# Patient Record
Sex: Male | Born: 1950 | Race: White | Hispanic: No | Marital: Married | Smoking: Former smoker
Health system: Southern US, Community
[De-identification: ages and names within clinical notes are randomized; demographics above are authoritative.]

## PROBLEM LIST (undated history)

## (undated) DIAGNOSIS — Z7960 Long term (current) use of unspecified immunomodulators and immunosuppressants: Secondary | ICD-10-CM

## (undated) DIAGNOSIS — R740 Nonspecific elevation of levels of transaminase and lactic acid dehydrogenase [LDH]: Secondary | ICD-10-CM

## (undated) DIAGNOSIS — K222 Esophageal obstruction: Secondary | ICD-10-CM

## (undated) DIAGNOSIS — K760 Fatty (change of) liver, not elsewhere classified: Secondary | ICD-10-CM

## (undated) DIAGNOSIS — E119 Type 2 diabetes mellitus without complications: Secondary | ICD-10-CM

## (undated) DIAGNOSIS — K449 Diaphragmatic hernia without obstruction or gangrene: Secondary | ICD-10-CM

## (undated) DIAGNOSIS — R195 Other fecal abnormalities: Secondary | ICD-10-CM

## (undated) DIAGNOSIS — G4733 Obstructive sleep apnea (adult) (pediatric): Secondary | ICD-10-CM

## (undated) DIAGNOSIS — I208 Other forms of angina pectoris: Secondary | ICD-10-CM

## (undated) DIAGNOSIS — U071 COVID-19: Secondary | ICD-10-CM

## (undated) DIAGNOSIS — L4050 Arthropathic psoriasis, unspecified: Secondary | ICD-10-CM

## (undated) DIAGNOSIS — Z79899 Other long term (current) drug therapy: Secondary | ICD-10-CM

## (undated) DIAGNOSIS — L409 Psoriasis, unspecified: Secondary | ICD-10-CM

## (undated) DIAGNOSIS — D649 Anemia, unspecified: Secondary | ICD-10-CM

## (undated) DIAGNOSIS — M5126 Other intervertebral disc displacement, lumbar region: Secondary | ICD-10-CM

## (undated) DIAGNOSIS — I2089 Other forms of angina pectoris: Secondary | ICD-10-CM

## (undated) DIAGNOSIS — I700 Atherosclerosis of aorta: Secondary | ICD-10-CM

## (undated) DIAGNOSIS — M5416 Radiculopathy, lumbar region: Secondary | ICD-10-CM

## (undated) DIAGNOSIS — K208 Other esophagitis without bleeding: Secondary | ICD-10-CM

## (undated) DIAGNOSIS — M67441 Ganglion, right hand: Secondary | ICD-10-CM

## (undated) DIAGNOSIS — M5137 Other intervertebral disc degeneration, lumbosacral region: Secondary | ICD-10-CM

## (undated) DIAGNOSIS — B3781 Candidal esophagitis: Secondary | ICD-10-CM

## (undated) DIAGNOSIS — K219 Gastro-esophageal reflux disease without esophagitis: Secondary | ICD-10-CM

## (undated) DIAGNOSIS — R972 Elevated prostate specific antigen [PSA]: Secondary | ICD-10-CM

## (undated) DIAGNOSIS — R945 Abnormal results of liver function studies: Secondary | ICD-10-CM

## (undated) DIAGNOSIS — I10 Essential (primary) hypertension: Secondary | ICD-10-CM

## (undated) DIAGNOSIS — G43109 Migraine with aura, not intractable, without status migrainosus: Secondary | ICD-10-CM

## (undated) DIAGNOSIS — M1990 Unspecified osteoarthritis, unspecified site: Secondary | ICD-10-CM

## (undated) DIAGNOSIS — K226 Gastro-esophageal laceration-hemorrhage syndrome: Secondary | ICD-10-CM

## (undated) DIAGNOSIS — I2510 Atherosclerotic heart disease of native coronary artery without angina pectoris: Secondary | ICD-10-CM

## (undated) DIAGNOSIS — R7401 Elevation of levels of liver transaminase levels: Secondary | ICD-10-CM

## (undated) DIAGNOSIS — T7840XA Allergy, unspecified, initial encounter: Secondary | ICD-10-CM

## (undated) DIAGNOSIS — R1310 Dysphagia, unspecified: Secondary | ICD-10-CM

## (undated) DIAGNOSIS — I493 Ventricular premature depolarization: Secondary | ICD-10-CM

## (undated) DIAGNOSIS — G4730 Sleep apnea, unspecified: Secondary | ICD-10-CM

## (undated) DIAGNOSIS — M51379 Other intervertebral disc degeneration, lumbosacral region without mention of lumbar back pain or lower extremity pain: Secondary | ICD-10-CM

## (undated) DIAGNOSIS — R7989 Other specified abnormal findings of blood chemistry: Secondary | ICD-10-CM

## (undated) DIAGNOSIS — E538 Deficiency of other specified B group vitamins: Secondary | ICD-10-CM

## (undated) DIAGNOSIS — K635 Polyp of colon: Secondary | ICD-10-CM

## (undated) DIAGNOSIS — E7800 Pure hypercholesterolemia, unspecified: Secondary | ICD-10-CM

## (undated) HISTORY — DX: Diaphragmatic hernia without obstruction or gangrene: K44.9

## (undated) HISTORY — DX: Sleep apnea, unspecified: G47.30

## (undated) HISTORY — PX: EYE SURGERY: SHX253

## (undated) HISTORY — PX: COLONOSCOPY: SHX174

## (undated) HISTORY — DX: Type 2 diabetes mellitus without complications: E11.9

## (undated) HISTORY — PX: ARTHROPLASTY: SHX135

## (undated) HISTORY — DX: COVID-19: U07.1

## (undated) HISTORY — PX: ROTATOR CUFF REPAIR: SHX139

## (undated) HISTORY — DX: Gastro-esophageal laceration-hemorrhage syndrome: K22.6

## (undated) HISTORY — PX: ESOPHAGOGASTRODUODENOSCOPY: SHX1529

## (undated) HISTORY — DX: Psoriasis, unspecified: L40.9

## (undated) HISTORY — DX: Dysphagia, unspecified: R13.10

## (undated) HISTORY — DX: Other specified abnormal findings of blood chemistry: R79.89

## (undated) HISTORY — DX: Arthropathic psoriasis, unspecified: L40.50

## (undated) HISTORY — DX: Abnormal results of liver function studies: R94.5

## (undated) HISTORY — DX: Essential (primary) hypertension: I10

## (undated) HISTORY — DX: Esophageal obstruction: K22.2

## (undated) HISTORY — PX: BLEPHAROPLASTY: SUR158

## (undated) HISTORY — PX: TONSILLECTOMY: SUR1361

## (undated) HISTORY — PX: CARDIAC CATHETERIZATION: SHX172

---

## 1968-01-15 HISTORY — PX: HERNIA REPAIR: SHX51

## 2002-01-14 HISTORY — PX: LUMBAR DISC SURGERY: SHX700

## 2003-01-15 HISTORY — PX: CARPAL TUNNEL RELEASE: SHX101

## 2003-11-07 ENCOUNTER — Other Ambulatory Visit: Payer: Self-pay

## 2003-11-07 ENCOUNTER — Emergency Department: Payer: Self-pay | Admitting: Emergency Medicine

## 2003-11-22 ENCOUNTER — Ambulatory Visit: Payer: Self-pay | Admitting: Internal Medicine

## 2003-12-06 ENCOUNTER — Ambulatory Visit: Payer: Self-pay | Admitting: General Surgery

## 2004-01-25 ENCOUNTER — Ambulatory Visit: Payer: Self-pay

## 2004-02-03 ENCOUNTER — Ambulatory Visit: Payer: Self-pay

## 2004-08-10 ENCOUNTER — Ambulatory Visit: Payer: Self-pay | Admitting: Internal Medicine

## 2004-12-25 ENCOUNTER — Ambulatory Visit: Payer: Self-pay | Admitting: Internal Medicine

## 2005-04-10 ENCOUNTER — Other Ambulatory Visit: Payer: Self-pay

## 2005-04-18 ENCOUNTER — Ambulatory Visit: Payer: Self-pay | Admitting: Unknown Physician Specialty

## 2005-11-13 ENCOUNTER — Ambulatory Visit: Payer: Self-pay | Admitting: Pain Medicine

## 2005-11-25 ENCOUNTER — Ambulatory Visit: Payer: Self-pay | Admitting: Pain Medicine

## 2005-11-28 ENCOUNTER — Ambulatory Visit: Payer: Self-pay | Admitting: Pain Medicine

## 2005-12-09 ENCOUNTER — Ambulatory Visit: Payer: Self-pay | Admitting: Gastroenterology

## 2005-12-17 ENCOUNTER — Ambulatory Visit: Payer: Self-pay | Admitting: Physician Assistant

## 2005-12-30 ENCOUNTER — Ambulatory Visit: Payer: Self-pay | Admitting: Pain Medicine

## 2005-12-31 ENCOUNTER — Ambulatory Visit: Payer: Self-pay | Admitting: Pain Medicine

## 2006-01-14 HISTORY — PX: NASAL SINUS SURGERY: SHX719

## 2006-01-14 HISTORY — PX: CHOLECYSTECTOMY: SHX55

## 2006-01-20 ENCOUNTER — Ambulatory Visit: Payer: Self-pay | Admitting: Pain Medicine

## 2006-01-30 ENCOUNTER — Ambulatory Visit: Payer: Self-pay | Admitting: Otolaryngology

## 2006-03-21 ENCOUNTER — Ambulatory Visit: Payer: Self-pay | Admitting: Otolaryngology

## 2006-03-25 ENCOUNTER — Ambulatory Visit: Payer: Self-pay | Admitting: Otolaryngology

## 2006-05-12 ENCOUNTER — Ambulatory Visit: Payer: Self-pay | Admitting: Internal Medicine

## 2006-05-15 ENCOUNTER — Ambulatory Visit: Payer: Self-pay | Admitting: Internal Medicine

## 2006-12-31 ENCOUNTER — Ambulatory Visit: Payer: Self-pay | Admitting: Unknown Physician Specialty

## 2006-12-31 ENCOUNTER — Other Ambulatory Visit: Payer: Self-pay

## 2007-01-06 ENCOUNTER — Ambulatory Visit: Payer: Self-pay | Admitting: Unknown Physician Specialty

## 2007-06-08 ENCOUNTER — Ambulatory Visit: Payer: Self-pay | Admitting: Unknown Physician Specialty

## 2008-05-13 ENCOUNTER — Ambulatory Visit: Payer: Self-pay | Admitting: Unknown Physician Specialty

## 2008-05-18 ENCOUNTER — Ambulatory Visit: Payer: Self-pay | Admitting: Pain Medicine

## 2008-05-24 ENCOUNTER — Ambulatory Visit: Payer: Self-pay | Admitting: Pain Medicine

## 2008-06-08 ENCOUNTER — Ambulatory Visit: Payer: Self-pay | Admitting: Physician Assistant

## 2008-08-29 ENCOUNTER — Ambulatory Visit: Payer: Self-pay | Admitting: Specialist

## 2009-01-03 ENCOUNTER — Ambulatory Visit: Payer: Self-pay | Admitting: Gastroenterology

## 2009-01-03 LAB — HM COLONOSCOPY: HM Colonoscopy: NORMAL

## 2010-04-09 ENCOUNTER — Ambulatory Visit: Payer: Self-pay | Admitting: Gastroenterology

## 2010-05-15 ENCOUNTER — Ambulatory Visit: Payer: Self-pay | Admitting: Gastroenterology

## 2010-06-08 ENCOUNTER — Ambulatory Visit: Payer: Self-pay | Admitting: Gastroenterology

## 2010-09-13 ENCOUNTER — Ambulatory Visit: Payer: Self-pay | Admitting: Internal Medicine

## 2010-10-05 ENCOUNTER — Ambulatory Visit: Payer: Self-pay | Admitting: Internal Medicine

## 2010-12-14 ENCOUNTER — Ambulatory Visit: Payer: Self-pay | Admitting: Gastroenterology

## 2011-02-12 ENCOUNTER — Other Ambulatory Visit: Payer: Self-pay | Admitting: Rheumatology

## 2011-02-12 LAB — SYNOVIAL CELL COUNT + DIFF, W/ CRYSTALS
Lymphocytes: 6 %
Neutrophils: 92 %
Nucleated Cell Count: 7298 /mm3

## 2011-07-04 ENCOUNTER — Ambulatory Visit: Payer: Self-pay | Admitting: Orthopedic Surgery

## 2011-09-25 ENCOUNTER — Other Ambulatory Visit: Payer: Self-pay | Admitting: Rheumatology

## 2011-09-25 LAB — SYNOVIAL CELL COUNT + DIFF, W/ CRYSTALS
Lymphocytes: 11 %
Neutrophils: 86 %
Nucleated Cell Count: 26813 /mm3
Other Cells BF: 0 %
Other Mononuclear Cells: 3 %

## 2011-10-03 ENCOUNTER — Ambulatory Visit: Payer: Self-pay | Admitting: Rheumatology

## 2012-01-30 ENCOUNTER — Encounter: Payer: Self-pay | Admitting: Internal Medicine

## 2012-01-30 ENCOUNTER — Ambulatory Visit (INDEPENDENT_AMBULATORY_CARE_PROVIDER_SITE_OTHER): Payer: BC Managed Care – PPO | Admitting: Internal Medicine

## 2012-01-30 VITALS — BP 118/70 | HR 67 | Temp 98.6°F | Ht 68.0 in | Wt 200.0 lb

## 2012-01-30 DIAGNOSIS — I10 Essential (primary) hypertension: Secondary | ICD-10-CM | POA: Insufficient documentation

## 2012-01-30 DIAGNOSIS — E119 Type 2 diabetes mellitus without complications: Secondary | ICD-10-CM

## 2012-01-30 DIAGNOSIS — R7989 Other specified abnormal findings of blood chemistry: Secondary | ICD-10-CM

## 2012-01-30 DIAGNOSIS — L4050 Arthropathic psoriasis, unspecified: Secondary | ICD-10-CM

## 2012-01-30 DIAGNOSIS — E1159 Type 2 diabetes mellitus with other circulatory complications: Secondary | ICD-10-CM | POA: Insufficient documentation

## 2012-01-30 DIAGNOSIS — R197 Diarrhea, unspecified: Secondary | ICD-10-CM

## 2012-01-30 DIAGNOSIS — E0822 Diabetes mellitus due to underlying condition with diabetic chronic kidney disease: Secondary | ICD-10-CM | POA: Insufficient documentation

## 2012-01-30 DIAGNOSIS — K219 Gastro-esophageal reflux disease without esophagitis: Secondary | ICD-10-CM

## 2012-01-30 DIAGNOSIS — R945 Abnormal results of liver function studies: Secondary | ICD-10-CM

## 2012-01-30 DIAGNOSIS — M1990 Unspecified osteoarthritis, unspecified site: Secondary | ICD-10-CM

## 2012-02-02 ENCOUNTER — Encounter: Payer: Self-pay | Admitting: Internal Medicine

## 2012-02-02 NOTE — Assessment & Plan Note (Signed)
Controlled.  Same medication.  Follow.   

## 2012-02-02 NOTE — Assessment & Plan Note (Signed)
Being followed.  Obtain latest lab results.

## 2012-02-02 NOTE — Assessment & Plan Note (Signed)
Being followed by Dr Tedd Sias.  Last a1c 5.2.  Same med regimen for now.  May have to consider the possibility of metformin causing the loose stool.  Await stool studies.

## 2012-02-02 NOTE — Assessment & Plan Note (Signed)
Blood pressure under good control.  Same medication.  Follow metabolic panel.   

## 2012-02-02 NOTE — Assessment & Plan Note (Signed)
Followed by Dr [facility].  Stable.  

## 2012-02-02 NOTE — Progress Notes (Signed)
Subjective:    Patient ID: Shane Mcknight, male    DOB: Jun 01, 1950, 62 y.o.   MRN: [id number]  HPI 62 year old male with past history of hypertension, diabetes mellitus, psoriatic arthritis (followed by Dr Gavin Potters) and elevated liver enzymes who comes in today for a scheduled follow up.  He states he is doing relatively well.  Recently diagnosed with diabetes.  Seeing Dr Tedd Sias.  Sugar doing better.  States his last a1c 5.2.  Has lost a lot of weight (approximately 40 lbs per pt).  States his am sugars averaging 70-135.  He is having persistent loose stool.  Started just before New Years.  Intermittently worse.  No vomiting.  No fever.  No significant abdominal pain.  No blood.  Worse at night.  He also reports some chest congestion and cough.  Intermittent green mucus.  No sob.  He is now taking four extended release metformin at night.  Wife has also had some bowel issues through this time.    Past Medical History  Diagnosis Date  . Hypertension   . Diabetes mellitus     type 2  . Abnormal liver function tests   . Psoriasis   . Hiatal hernia   . Psoriatic arthritis     Current Outpatient Prescriptions on File Prior to Visit  Medication Sig Dispense Refill  . atenolol (TENORMIN) 50 MG tablet Take 50 mg by mouth 2 (two) times daily.      Marland Kitchen esomeprazole (NEXIUM) 40 MG capsule Take 40 mg by mouth 2 (two) times daily.      Marland Kitchen glipiZIDE (GLIPIZIDE XL) 5 MG 24 hr tablet 2 tablets each morning      . InFLIXimab (REMICADE IV) Inject into the vein. Every 8 weeks.  Per Dr Gavin Potters      . losartan (COZAAR) 50 MG tablet Take 50 mg by mouth daily.      . metFORMIN (GLUCOPHAGE) 500 MG tablet 4 tablets daily at supper        Review of Systems Patient denies any headache, lightheadedness or dizziness.  No significant sinus issues.  No chest pain, tightness or palpitations.  No increased shortness of breath.  Does report the increased cough and congestion as outlined.  No nausea or vomiting.  No significant  abdominal pain or cramping.  No BRBPR or melana.  Does report the loose stool as outlined.  No urine change.  Sugar as outlined.  Blood pressures averaging 120-130/60-70.       Objective:   Physical Exam Filed Vitals:   01/30/12 1427  BP: 118/70  Pulse: 67  Temp: 98.6 F (37 C)   Blood pressure recheck:  128/76, pulse 74  62 year old male in no acute distress.   HEENT:  Nares - clear.  OP- without lesions or erythema.  TMs visualized without erythema.  NECK:  Supple, nontender.  No audible bruit.   HEART:  Appears to be regular. LUNGS:  Without crackles or wheezing audible.  Respirations even and unlabored.  Some minimal increased cough with forced expiration.  RADIAL PULSE:  Equal bilaterally.  ABDOMEN:  Soft, nontender.  No audible abdominal bruit.   EXTREMITIES:  No increased edema to be present.                    Assessment & Plan:  INCREASED COUGH AND CONGESTION.  Probably viral.  Treat with Robitussin/Mucinex as directed.  Saline nasal spray as directed.  Do not feel abx warranted.  Albuterol inhaler  as directed.  Follow.  Notify me if symptoms worsen or do not improve.    PERSISTENT LOOSE STOOL.  Could be related to the metformin.  However, his wife has also had some issues.  Last abx 11/13.  Will check stool for C. Diff and routine culture - given duration.  Start a probiotic.  Increased fiber.  May need to discuss with endocrinology metformin usage.  Follow.  Check cbc and metabolic panel.   HEALTH MAINTENANCE.  Obtain outside records to review.

## 2012-02-03 ENCOUNTER — Other Ambulatory Visit (INDEPENDENT_AMBULATORY_CARE_PROVIDER_SITE_OTHER): Payer: BC Managed Care – PPO

## 2012-02-03 DIAGNOSIS — R197 Diarrhea, unspecified: Secondary | ICD-10-CM

## 2012-02-04 LAB — FECAL LACTOFERRIN, QUANT: Lactoferrin: NEGATIVE

## 2012-02-05 LAB — OVA AND PARASITE SCREEN: OP: NONE SEEN

## 2012-02-07 ENCOUNTER — Other Ambulatory Visit: Payer: Self-pay | Admitting: Internal Medicine

## 2012-02-07 ENCOUNTER — Encounter: Payer: Self-pay | Admitting: Internal Medicine

## 2012-02-07 LAB — STOOL CULTURE

## 2012-02-07 NOTE — Progress Notes (Signed)
Dr Gavin Potters discontinued the Plaquenil.  Updated med list

## 2012-02-10 ENCOUNTER — Telehealth: Payer: Self-pay | Admitting: Internal Medicine

## 2012-02-10 NOTE — Telephone Encounter (Signed)
Patient returning your call about results.

## 2012-02-12 NOTE — Telephone Encounter (Signed)
Informed patient of test results.

## 2012-02-29 ENCOUNTER — Other Ambulatory Visit: Payer: Self-pay

## 2012-03-12 ENCOUNTER — Ambulatory Visit (INDEPENDENT_AMBULATORY_CARE_PROVIDER_SITE_OTHER): Payer: BC Managed Care – PPO | Admitting: Internal Medicine

## 2012-03-12 ENCOUNTER — Encounter: Payer: Self-pay | Admitting: Internal Medicine

## 2012-03-12 VITALS — BP 138/84 | HR 66 | Temp 98.5°F | Ht 68.0 in | Wt 204.0 lb

## 2012-03-12 DIAGNOSIS — K219 Gastro-esophageal reflux disease without esophagitis: Secondary | ICD-10-CM

## 2012-03-12 DIAGNOSIS — Z125 Encounter for screening for malignant neoplasm of prostate: Secondary | ICD-10-CM

## 2012-03-12 DIAGNOSIS — R945 Abnormal results of liver function studies: Secondary | ICD-10-CM

## 2012-03-12 DIAGNOSIS — G4733 Obstructive sleep apnea (adult) (pediatric): Secondary | ICD-10-CM

## 2012-03-12 DIAGNOSIS — E119 Type 2 diabetes mellitus without complications: Secondary | ICD-10-CM

## 2012-03-12 DIAGNOSIS — I10 Essential (primary) hypertension: Secondary | ICD-10-CM

## 2012-03-12 DIAGNOSIS — R7989 Other specified abnormal findings of blood chemistry: Secondary | ICD-10-CM

## 2012-03-12 DIAGNOSIS — L4050 Arthropathic psoriasis, unspecified: Secondary | ICD-10-CM

## 2012-03-13 ENCOUNTER — Telehealth: Payer: Self-pay | Admitting: Internal Medicine

## 2012-03-13 NOTE — Telephone Encounter (Signed)
Notified of labs via my chart 

## 2012-03-16 ENCOUNTER — Encounter: Payer: Self-pay | Admitting: Internal Medicine

## 2012-03-16 DIAGNOSIS — G4733 Obstructive sleep apnea (adult) (pediatric): Secondary | ICD-10-CM | POA: Insufficient documentation

## 2012-03-16 NOTE — Assessment & Plan Note (Signed)
Followed by Dr [facility].  Stable.  

## 2012-03-16 NOTE — Assessment & Plan Note (Signed)
Avoid sleeping supine and avoid sedating medication.    

## 2012-03-16 NOTE — Assessment & Plan Note (Signed)
Controlled.  Same medication.  Follow.   

## 2012-03-16 NOTE — Assessment & Plan Note (Signed)
Being followed.  Obtain latest lab results.  

## 2012-03-16 NOTE — Progress Notes (Addendum)
Subjective:    Patient ID: Shane Mcknight, male    DOB: October 06, 1950, 61 y.o.   MRN: [id number]  HPI 62 year old male with past history of hypertension, diabetes mellitus, psoriatic arthritis (followed by Dr Gavin Potters) and elevated liver enzymes who comes in today to follow up on these issues as well as for a complete physical exam.  He states he is doing relatively well.  Recently diagnosed with diabetes.  Seeing Dr Tedd Sias.  Sugar doing much better.  States his last a1c 5.2.  Has lost a lot of weight (approximately 40 lbs per pt).  States his highest am or pm reading has been 139.  Was previously having persistent loose stool.  Discussed the possibility of metformin causing.  He changed back to metformin (not the extended release) and bowels have been doing better.  No abdominal pain or cramping.  No diarrhea.  Joints stable.  Continues to see Dr Gavin Potters.     Past Medical History  Diagnosis Date  . Hypertension   . Diabetes mellitus     type 2  . Abnormal liver function tests   . Psoriasis   . Hiatal hernia   . Psoriatic arthritis     Current Outpatient Prescriptions on File Prior to Visit  Medication Sig Dispense Refill  . aspirin 81 MG tablet Take 81 mg by mouth daily.      Marland Kitchen esomeprazole (NEXIUM) 40 MG capsule Take 40 mg by mouth 2 (two) times daily.      . fish oil-omega-3 fatty acids 1000 MG capsule Take 2 g by mouth daily.      . folic acid (FOLVITE) 1 MG tablet Take 1 mg by mouth daily.      Marland Kitchen glipiZIDE (GLIPIZIDE XL) 5 MG 24 hr tablet 2 tablets each morning      . InFLIXimab (REMICADE IV) Inject into the vein. Every 8 weeks.  Per Dr Gavin Potters      . losartan (COZAAR) 50 MG tablet Take 50 mg by mouth daily.      . meloxicam (MOBIC) 15 MG tablet Take 15 mg by mouth daily.      . metFORMIN (GLUCOPHAGE) 500 MG tablet 4 tablets daily at supper      . Multiple Vitamin (MULTIVITAMIN) tablet Take 1 tablet by mouth daily.      . vitamin E 400 UNIT capsule Take 400 Units by mouth daily.      Marland Kitchen  atenolol (TENORMIN) 50 MG tablet Take 50 mg by mouth 2 (two) times daily.      . traMADol (ULTRAM-ER) 200 MG 24 hr tablet Take 200 mg by mouth daily.       No current facility-administered medications on file prior to visit.    Review of Systems Patient denies any headache, lightheadedness or dizziness.  No significant sinus issues.  No chest pain, tightness or palpitations.  No increased shortness of breath.  No cough or congestion.  No nausea or vomiting.  No significant abdominal pain or cramping.  No BRBPR or melana.  The previous loose stool has resolved.  Bowels back to normal.   No urine change.  Sugar as outlined.  Blood pressures doing well.  Joints stable.        Objective:   Physical Exam  Filed Vitals:   03/12/12 1612  BP: 138/84  Pulse: 66  Temp: 98.5 F (70.73 C)   62 year old male in no acute distress.  HEENT:  Nares - clear.  Oropharynx - without lesions.  NECK:  Supple.  Nontender.  No audible carotid bruit.  HEART:  Appears to be regular.   LUNGS:  No crackles or wheezing audible.  Respirations even and unlabored.   RADIAL PULSE:  Equal bilaterally.  ABDOMEN:  Soft.  Nontender.  Bowel sounds present and normal.  No audible abdominal bruit.  GU:  Normal descended testicles.  No palpable testicular nodules.   RECTAL:  Could not appreciate any palpable prostate nodules.  Heme negative.   EXTREMITIES:  No increased edema present.  DP pulses palpable and equal bilaterally.            Assessment & Plan:   PREVIOUS LOOSE STOOL.  Resolved with changing the metformin.   RENAL CYST.  Previous renal ultrasound revealed a left parapelvic cyst.  Follow.     HEALTH MAINTENANCE.  Physical today.  Check psa.  Obtain records to review last colonoscopy.

## 2012-03-16 NOTE — Assessment & Plan Note (Signed)
Being followed by Dr Tedd Sias.  Last a1c 5.2.  Adjusted the metformin and the loose stool resolved.  Continue diet and exercise.  Follow.

## 2012-03-16 NOTE — Assessment & Plan Note (Signed)
Blood pressure under good control.  Same medication.  Follow metabolic panel.   

## 2012-04-17 ENCOUNTER — Telehealth: Payer: Self-pay | Admitting: *Deleted

## 2012-04-17 MED ORDER — ESOMEPRAZOLE MAGNESIUM 40 MG PO CPDR: 40.0000 mg | DELAYED_RELEASE_CAPSULE | Freq: Two times a day (BID) | ORAL | Status: DC

## 2012-04-17 NOTE — Telephone Encounter (Signed)
Pharmacy called with rx request 

## 2012-07-01 ENCOUNTER — Other Ambulatory Visit: Payer: Self-pay | Admitting: Internal Medicine

## 2012-07-24 LAB — HEMOGLOBIN A1C: Hgb A1c MFr Bld: 5.4 % (ref 4.0–6.0)

## 2012-07-30 LAB — HM DIABETES FOOT EXAM

## 2012-08-19 ENCOUNTER — Encounter: Payer: Self-pay | Admitting: *Deleted

## 2012-08-31 NOTE — Telephone Encounter (Signed)
Mailed unread message to pt  

## 2012-09-09 ENCOUNTER — Telehealth: Payer: Self-pay | Admitting: *Deleted

## 2012-09-09 ENCOUNTER — Encounter: Payer: Self-pay | Admitting: Internal Medicine

## 2012-09-09 ENCOUNTER — Encounter: Payer: Self-pay | Admitting: *Deleted

## 2012-09-09 NOTE — Telephone Encounter (Signed)
Updated med list per mychart message 

## 2012-11-02 ENCOUNTER — Other Ambulatory Visit: Payer: Self-pay | Admitting: Internal Medicine

## 2012-12-06 ENCOUNTER — Other Ambulatory Visit: Payer: Self-pay | Admitting: Internal Medicine

## 2012-12-09 ENCOUNTER — Encounter: Payer: Self-pay | Admitting: *Deleted

## 2012-12-10 ENCOUNTER — Encounter: Payer: Self-pay | Admitting: Internal Medicine

## 2012-12-22 ENCOUNTER — Encounter: Payer: Self-pay | Admitting: Internal Medicine

## 2012-12-31 ENCOUNTER — Encounter: Payer: Self-pay | Admitting: *Deleted

## 2013-01-06 ENCOUNTER — Other Ambulatory Visit: Payer: Self-pay | Admitting: Internal Medicine

## 2013-02-01 ENCOUNTER — Other Ambulatory Visit: Payer: Self-pay | Admitting: Internal Medicine

## 2013-04-09 ENCOUNTER — Ambulatory Visit (INDEPENDENT_AMBULATORY_CARE_PROVIDER_SITE_OTHER): Payer: BC Managed Care – PPO | Admitting: Internal Medicine

## 2013-04-09 ENCOUNTER — Encounter: Payer: Self-pay | Admitting: Internal Medicine

## 2013-04-09 VITALS — BP 140/90 | HR 98 | Temp 98.2°F | Ht 67.5 in | Wt 200.5 lb

## 2013-04-09 DIAGNOSIS — G4733 Obstructive sleep apnea (adult) (pediatric): Secondary | ICD-10-CM

## 2013-04-09 DIAGNOSIS — R7989 Other specified abnormal findings of blood chemistry: Secondary | ICD-10-CM

## 2013-04-09 DIAGNOSIS — E119 Type 2 diabetes mellitus without complications: Secondary | ICD-10-CM

## 2013-04-09 DIAGNOSIS — R945 Abnormal results of liver function studies: Secondary | ICD-10-CM

## 2013-04-09 DIAGNOSIS — Z125 Encounter for screening for malignant neoplasm of prostate: Secondary | ICD-10-CM

## 2013-04-09 DIAGNOSIS — Z1211 Encounter for screening for malignant neoplasm of colon: Secondary | ICD-10-CM

## 2013-04-09 DIAGNOSIS — K219 Gastro-esophageal reflux disease without esophagitis: Secondary | ICD-10-CM

## 2013-04-09 DIAGNOSIS — L4050 Arthropathic psoriasis, unspecified: Secondary | ICD-10-CM

## 2013-04-09 DIAGNOSIS — K625 Hemorrhage of anus and rectum: Secondary | ICD-10-CM

## 2013-04-09 DIAGNOSIS — E7800 Pure hypercholesterolemia, unspecified: Secondary | ICD-10-CM

## 2013-04-09 DIAGNOSIS — L989 Disorder of the skin and subcutaneous tissue, unspecified: Secondary | ICD-10-CM

## 2013-04-09 DIAGNOSIS — I10 Essential (primary) hypertension: Secondary | ICD-10-CM

## 2013-04-09 MED ORDER — DOXYCYCLINE HYCLATE 100 MG PO TABS: 100.0000 mg | ORAL_TABLET | Freq: Two times a day (BID) | ORAL | Status: DC

## 2013-04-09 NOTE — Progress Notes (Signed)
Subjective:    Patient ID: Shane Mcknight, male    DOB: 04/16/1950, 63 y.o.   MRN: [id number]  HPI 63 year old male with past history of hypertension, diabetes mellitus, psoriatic arthritis (followed by Dr Jefm Bryant) and elevated liver enzymes who comes in today to follow up on these issues as well as for a complete physical exam.  He states he is doing relatively well.  Seeing Dr Gabriel Carina for his diabetes.  Sugars doing much better.   No abdominal pain or cramping.  Continues to see Dr Jefm Bryant.  On Humara now.  Increased flare of psoriasis.  Left lower leg > right.  Some increased stiffness in his hands.  Humara appears to be doing better than other medications for his arthritis.  Previously noticed some rectal bleeding.  Self limited.  He felt was hemorrhoidal bleeding.  No further bleeding since.  Bowels normal now.  He does report a lesion on his right lower buttock.  Minimal irritation.  No chest pain or tightness.  No sob.  Blood pressure on his report - doing well.      Past Medical History  Diagnosis Date  . Hypertension   . Diabetes mellitus     type 2  . Abnormal liver function tests   . Psoriasis   . Hiatal hernia   . Psoriatic arthritis   . Sleep apnea     Current Outpatient Prescriptions on File Prior to Visit  Medication Sig Dispense Refill  . Adalimumab (HUMIRA PEN [place]) Inject into the skin every 7 (seven) days.       Marland Kitchen aspirin 81 MG tablet Take 81 mg by mouth daily.      Marland Kitchen atenolol (TENORMIN) 50 MG tablet Take 1 tablet (50 mg total) by mouth 2 (two) times daily. MUST KEEP APPT ON 04/09/13 FOR FURTHER REFILLS  60 tablet  3  . esomeprazole (NEXIUM) 40 MG capsule Take 1 capsule (40 mg total) by mouth 2 (two) times daily.  60 capsule  5  . fish oil-omega-3 fatty acids 1000 MG capsule Take 2 g by mouth daily.      . folic acid (FOLVITE) 1 MG tablet Take 1 mg by mouth daily.      Marland Kitchen losartan (COZAAR) 50 MG tablet Take 50 mg by mouth daily.      . meloxicam (MOBIC) 15 MG tablet Take 15 mg  by mouth daily.      . metFORMIN (GLUCOPHAGE) 500 MG tablet 4 tablets daily at supper      . Multiple Vitamin (MULTIVITAMIN) tablet Take 1 tablet by mouth daily.      . vitamin E 400 UNIT capsule Take 400 Units by mouth daily.       No current facility-administered medications on file prior to visit.    Review of Systems Patient denies any significant headache.  No lightheadedness or dizziness.  No significant sinus issues.  Some allergy symptoms at times.   No chest pain, tightness or palpitations.  No increased shortness of breath.  No cough or congestion.  No nausea or vomiting.  No significant abdominal pain or cramping.  No BRBPR or melana now.  Previous rectal bleeding as outlined.  The previous loose stool has resolved.  Bowels back to normal.   No urine change.  Sugars doing better.          Objective:   Physical Exam  Filed Vitals:   04/09/13 1556  BP: 140/90  Pulse: 98  Temp: 98.2 F (36.8 C)  Blood pressure recheck:  49-7/39  63 year old male in no acute distress.  HEENT:  Nares - clear.  Oropharynx - without lesions. NECK:  Supple.  Nontender.  No audible carotid bruit.  HEART:  Appears to be regular.   LUNGS:  No crackles or wheezing audible.  Respirations even and unlabored.   RADIAL PULSE:  Equal bilaterally.  ABDOMEN:  Soft.  Nontender.  Bowel sounds present and normal.  No audible abdominal bruit.  GU:  Normal descended testicles.  No palpable testicular nodules.   RECTAL:  Could not appreciate any palpable prostate nodules.  Heme negative.   EXTREMITIES:  No increased edema present.  DP pulses palpable and equal bilaterally.  SKIN:  Psoriasis over lower extremities (left > right).   Raised lesion - right buttock. Minimal tenderness.  FEET:  No lesions.             Assessment & Plan:   PREVIOUS LOOSE STOOL.  Resolved now.  Bowels normal now.    RENAL CYST.  Previous renal ultrasound revealed a left parapelvic cyst.  Follow.     HEALTH MAINTENANCE.   Physical today.  Check psa.   Colonoscopy 2010 - sigmoid polyp and internal hemorrhoids.

## 2013-04-09 NOTE — Progress Notes (Signed)
Pre-visit discussion using our clinic review tool. No additional management support is needed unless otherwise documented below in the visit note.  

## 2013-04-11 ENCOUNTER — Encounter: Payer: Self-pay | Admitting: Internal Medicine

## 2013-04-12 ENCOUNTER — Encounter: Payer: Self-pay | Admitting: Internal Medicine

## 2013-04-12 DIAGNOSIS — K625 Hemorrhage of anus and rectum: Secondary | ICD-10-CM | POA: Insufficient documentation

## 2013-04-12 DIAGNOSIS — L989 Disorder of the skin and subcutaneous tissue, unspecified: Secondary | ICD-10-CM | POA: Insufficient documentation

## 2013-04-12 NOTE — Assessment & Plan Note (Signed)
Being followed.  Last lab check from [facility] - liver panel wnl.   

## 2013-04-12 NOTE — Assessment & Plan Note (Signed)
Followed by Dr [facility].  Stable.  On Humara.  

## 2013-04-12 NOTE — Assessment & Plan Note (Signed)
Skin lesion - right buttock.  Doxycycline as directed.  Warm compresses.  Follow.  Notify me if persistent.

## 2013-04-12 NOTE — Assessment & Plan Note (Signed)
Being followed by Dr Gabriel Carina.  Continue diet and exercise.  Follow.

## 2013-04-12 NOTE — Assessment & Plan Note (Signed)
Blood pressure has been under good control.  Slightly elevated today.  Will have him spot check his pressures.  Continue same medication for now.   Follow metabolic panel.  Get him back in soon to reassess.

## 2013-04-12 NOTE — Assessment & Plan Note (Signed)
Avoid sleeping supine and avoid sedating medication.    

## 2013-04-12 NOTE — Assessment & Plan Note (Signed)
Has resolved now.  He felt related to hemorrhoidal bleeding.  Discussed further GI evaluation.  He wants to hold at this time.  Follow.

## 2013-04-12 NOTE — Assessment & Plan Note (Signed)
Controlled.  Same medication.  Follow.   

## 2013-04-14 ENCOUNTER — Other Ambulatory Visit (INDEPENDENT_AMBULATORY_CARE_PROVIDER_SITE_OTHER): Payer: BC Managed Care – PPO

## 2013-04-14 DIAGNOSIS — Z1211 Encounter for screening for malignant neoplasm of colon: Secondary | ICD-10-CM

## 2013-04-14 LAB — FECAL OCCULT BLOOD, IMMUNOCHEMICAL: Fecal Occult Bld: POSITIVE — AB

## 2013-04-14 LAB — HM DIABETES EYE EXAM

## 2013-04-15 ENCOUNTER — Encounter: Payer: Self-pay | Admitting: *Deleted

## 2013-04-15 DIAGNOSIS — R195 Other fecal abnormalities: Secondary | ICD-10-CM

## 2013-04-16 ENCOUNTER — Encounter: Payer: BC Managed Care – PPO | Admitting: Internal Medicine

## 2013-04-16 ENCOUNTER — Other Ambulatory Visit: Payer: Self-pay | Admitting: Internal Medicine

## 2013-04-16 DIAGNOSIS — K625 Hemorrhage of anus and rectum: Secondary | ICD-10-CM

## 2013-04-16 DIAGNOSIS — R195 Other fecal abnormalities: Secondary | ICD-10-CM

## 2013-04-16 NOTE — Progress Notes (Signed)
Order placed for GI referral.   

## 2013-04-19 ENCOUNTER — Other Ambulatory Visit (INDEPENDENT_AMBULATORY_CARE_PROVIDER_SITE_OTHER): Payer: BC Managed Care – PPO

## 2013-04-19 DIAGNOSIS — K625 Hemorrhage of anus and rectum: Secondary | ICD-10-CM

## 2013-04-19 DIAGNOSIS — I10 Essential (primary) hypertension: Secondary | ICD-10-CM

## 2013-04-19 DIAGNOSIS — Z125 Encounter for screening for malignant neoplasm of prostate: Secondary | ICD-10-CM

## 2013-04-19 DIAGNOSIS — R7989 Other specified abnormal findings of blood chemistry: Secondary | ICD-10-CM

## 2013-04-19 DIAGNOSIS — E7800 Pure hypercholesterolemia, unspecified: Secondary | ICD-10-CM

## 2013-04-19 DIAGNOSIS — R945 Abnormal results of liver function studies: Secondary | ICD-10-CM

## 2013-04-19 LAB — CBC WITH DIFFERENTIAL/PLATELET
BASOS ABS: 0 10*3/uL (ref 0.0–0.1)
BASOS PCT: 0.4 % (ref 0.0–3.0)
Eosinophils Absolute: 0.4 10*3/uL (ref 0.0–0.7)
Eosinophils Relative: 6.9 % — ABNORMAL HIGH (ref 0.0–5.0)
HCT: 41.7 % (ref 39.0–52.0)
HEMOGLOBIN: 14.1 g/dL (ref 13.0–17.0)
LYMPHS PCT: 26.1 % (ref 12.0–46.0)
Lymphs Abs: 1.5 10*3/uL (ref 0.7–4.0)
MCHC: 33.8 g/dL (ref 30.0–36.0)
MCV: 83.3 fl (ref 78.0–100.0)
MONOS PCT: 12.3 % — AB (ref 3.0–12.0)
Monocytes Absolute: 0.7 10*3/uL (ref 0.1–1.0)
NEUTROS ABS: 3.1 10*3/uL (ref 1.4–7.7)
Neutrophils Relative %: 54.3 % (ref 43.0–77.0)
Platelets: 215 10*3/uL (ref 150.0–400.0)
RBC: 5 Mil/uL (ref 4.22–5.81)
RDW: 16.4 % — AB (ref 11.5–14.6)
WBC: 5.7 10*3/uL (ref 4.5–10.5)

## 2013-04-19 LAB — HEPATIC FUNCTION PANEL
ALT: 27 U/L (ref 0–53)
AST: 25 U/L (ref 0–37)
Albumin: 4.2 g/dL (ref 3.5–5.2)
Alkaline Phosphatase: 48 U/L (ref 39–117)
BILIRUBIN TOTAL: 0.6 mg/dL (ref 0.3–1.2)
Bilirubin, Direct: 0.1 mg/dL (ref 0.0–0.3)
Total Protein: 8.1 g/dL (ref 6.0–8.3)

## 2013-04-19 LAB — BASIC METABOLIC PANEL
BUN: 14 mg/dL (ref 6–23)
CO2: 26 mEq/L (ref 19–32)
Calcium: 9.3 mg/dL (ref 8.4–10.5)
Chloride: 106 mEq/L (ref 96–112)
Creatinine, Ser: 0.7 mg/dL (ref 0.4–1.5)
GFR: 126.71 mL/min (ref >60.00)
GLUCOSE: 111 mg/dL — AB (ref 70–99)
Potassium: 3.8 mEq/L (ref 3.5–5.1)
Sodium: 141 mEq/L (ref 135–145)

## 2013-04-19 LAB — PSA, MEDICARE: PSA: 1.62 ng/mL (ref 0.10–4.00)

## 2013-04-19 LAB — LIPID PANEL
Cholesterol: 202 mg/dL — ABNORMAL HIGH (ref 0–200)
HDL: 33 mg/dL — ABNORMAL LOW (ref >39.00)
LDL Cholesterol: 140 mg/dL — ABNORMAL HIGH (ref 0–99)
Total CHOL/HDL Ratio: 6
Triglycerides: 143 mg/dL (ref 0.0–149.0)
VLDL: 28.6 mg/dL (ref 0.0–40.0)

## 2013-04-20 ENCOUNTER — Encounter: Payer: Self-pay | Admitting: Emergency Medicine

## 2013-04-20 NOTE — Telephone Encounter (Signed)
Order placed for GI referral.   

## 2013-04-25 ENCOUNTER — Encounter: Payer: Self-pay | Admitting: Internal Medicine

## 2013-04-25 DIAGNOSIS — R972 Elevated prostate specific antigen [PSA]: Secondary | ICD-10-CM | POA: Insufficient documentation

## 2013-04-29 ENCOUNTER — Other Ambulatory Visit: Payer: Self-pay | Admitting: Internal Medicine

## 2013-05-14 ENCOUNTER — Telehealth: Payer: Self-pay | Admitting: Internal Medicine

## 2013-05-14 ENCOUNTER — Encounter: Payer: Self-pay | Admitting: Internal Medicine

## 2013-05-14 ENCOUNTER — Ambulatory Visit (INDEPENDENT_AMBULATORY_CARE_PROVIDER_SITE_OTHER): Payer: BC Managed Care – PPO | Admitting: Internal Medicine

## 2013-05-14 VITALS — BP 158/100 | HR 72 | Temp 98.5°F | Ht 67.5 in | Wt 200.2 lb

## 2013-05-14 DIAGNOSIS — R7989 Other specified abnormal findings of blood chemistry: Secondary | ICD-10-CM

## 2013-05-14 DIAGNOSIS — H539 Unspecified visual disturbance: Secondary | ICD-10-CM

## 2013-05-14 DIAGNOSIS — G4733 Obstructive sleep apnea (adult) (pediatric): Secondary | ICD-10-CM

## 2013-05-14 DIAGNOSIS — M549 Dorsalgia, unspecified: Secondary | ICD-10-CM

## 2013-05-14 DIAGNOSIS — L989 Disorder of the skin and subcutaneous tissue, unspecified: Secondary | ICD-10-CM

## 2013-05-14 DIAGNOSIS — K625 Hemorrhage of anus and rectum: Secondary | ICD-10-CM

## 2013-05-14 DIAGNOSIS — E119 Type 2 diabetes mellitus without complications: Secondary | ICD-10-CM

## 2013-05-14 DIAGNOSIS — I10 Essential (primary) hypertension: Secondary | ICD-10-CM

## 2013-05-14 DIAGNOSIS — K219 Gastro-esophageal reflux disease without esophagitis: Secondary | ICD-10-CM

## 2013-05-14 DIAGNOSIS — R945 Abnormal results of liver function studies: Secondary | ICD-10-CM

## 2013-05-14 DIAGNOSIS — R972 Elevated prostate specific antigen [PSA]: Secondary | ICD-10-CM

## 2013-05-14 DIAGNOSIS — L4050 Arthropathic psoriasis, unspecified: Secondary | ICD-10-CM

## 2013-05-14 MED ORDER — GABAPENTIN 100 MG PO CAPS: 100.0000 mg | ORAL_CAPSULE | Freq: Three times a day (TID) | ORAL | Status: DC

## 2013-05-14 MED ORDER — GABAPENTIN 300 MG PO CAPS: 300.0000 mg | ORAL_CAPSULE | Freq: Three times a day (TID) | ORAL | Status: DC

## 2013-05-14 MED ORDER — HYDROCHLOROTHIAZIDE 25 MG PO TABS: 25.0000 mg | ORAL_TABLET | Freq: Every day | ORAL | Status: DC

## 2013-05-14 NOTE — Progress Notes (Signed)
Subjective:    Patient ID: Shane Mcknight, male    DOB: [birth year], 63 y.o.   MRN: [id number]  HPI 63 year old male with past history of hypertension, diabetes mellitus, psoriatic arthritis (followed by Dr Jefm Bryant) and elevated liver enzymes who comes in today for a scheduled follow up.  He was scheduled for a blood pressure follow up.  He has multiple issues to discuss today.  Seeing Dr Gabriel Carina for his diabetes.  Sugars doing much better.   No abdominal pain or cramping.  Continues to see Dr Jefm Bryant.  On Humara now.   Is having increased pain in his low back and right buttock and right leg.  Extends into his right groin.  Has eased some since the flare.  Is on mobic.  Discussed my desire to stop this given his blood pressure is remaining elevated.  Can use the gabapentin instead.   He reporst the lesion on his right lower buttock is persistent.  Better but persistent.   No chest pain or tightness.  No sob.  Blood pressure checks are averaging 160-170/<88.   He reports that he has noticed the vision change again.   Has changes in his vision in the shape of a boomerang.  Similar to previous episodes.   Had neuro w/up and opthalmology w/up previously.  Felt to be a migraine variant.  No headache associated with these vision changes.      Past Medical History  Diagnosis Date  . Hypertension   . Diabetes mellitus     type 2  . Abnormal liver function tests   . Psoriasis   . Hiatal hernia   . Psoriatic arthritis   . Sleep apnea     Current Outpatient Prescriptions on File Prior to Visit  Medication Sig Dispense Refill  . Adalimumab (HUMIRA PEN [place]) Inject into the skin every 7 (seven) days.       Marland Kitchen aspirin 81 MG tablet Take 81 mg by mouth daily.      Marland Kitchen atenolol (TENORMIN) 50 MG tablet Take 1 tablet (50 mg total) by mouth 2 (two) times daily. MUST KEEP APPT ON 04/09/13 FOR FURTHER REFILLS  60 tablet  3  . fish oil-omega-3 fatty acids 1000 MG capsule Take 2 g by mouth daily.      . folic acid (FOLVITE)  1 MG tablet Take 1 mg by mouth daily.      Marland Kitchen leflunomide (ARAVA) 10 MG tablet Take 10 mg by mouth daily.      Marland Kitchen losartan (COZAAR) 50 MG tablet Take 50 mg by mouth daily.      . meloxicam (MOBIC) 15 MG tablet Take 15 mg by mouth daily.      . metFORMIN (GLUCOPHAGE) 500 MG tablet 4 tablets daily at supper      . Multiple Vitamin (MULTIVITAMIN) tablet Take 1 tablet by mouth daily.      Marland Kitchen NEXIUM 40 MG capsule TAKE ONE CAPSULE BY MOUTH TWICE DAILY  60 capsule  5  . vitamin E 400 UNIT capsule Take 400 Units by mouth daily.       No current facility-administered medications on file prior to visit.    Review of Systems Patient denies any significant headache.  No lightheadedness or dizziness.  No significant sinus issues.  Some allergy symptoms at times.   No chest pain, tightness or palpitations.  No increased shortness of breath.  No cough or congestion.  No nausea or vomiting.  No significant abdominal pain or cramping.  No BRBPR or melana now.  Previous rectal bleeding as outlined.  The previous loose stool has resolved.  Bowels back to normal.   No urine change.  Sugars doing better.          Objective:   Physical Exam  Filed Vitals:   05/14/13 1516  BP: 158/100  Pulse: 72  Temp: 98.5 F (36.9 C)   Blood pressure recheck:  52-35/57  63 year old male in no acute distress.  HEENT:  Nares - clear.  Oropharynx - without lesions. NECK:  Supple.  Nontender.  No audible carotid bruit.  HEART:  Appears to be regular.   LUNGS:  No crackles or wheezing audible.  Respirations even and unlabored.   RADIAL PULSE:  Equal bilaterally.  ABDOMEN:  Soft.  Nontender.  Bowel sounds present and normal.  No audible abdominal bruit.   EXTREMITIES:  No increased edema present.  DP pulses palpable and equal bilaterally.  SKIN:   Raised lesion - right buttock. Minimal tenderness.  FEET:  No lesions.             Assessment & Plan:   PREVIOUS LOOSE STOOL.  Resolved now.  Bowels normal now.    RENAL  CYST.  Previous renal ultrasound revealed a left parapelvic cyst.  Follow.     HEALTH MAINTENANCE.  Physical last visit.  Check psa.   Colonoscopy 2010 - sigmoid polyp and internal hemorrhoids.  Was referred to GI for intermittent rectal bleeding.  Is scheduled to have a colonoscopy soon.  Will need to postpone until we get the blood pressure under better control, etc.

## 2013-05-14 NOTE — Progress Notes (Signed)
Pre visit review using our clinic review tool, if applicable. No additional management support is needed unless otherwise documented below in the visit note. 

## 2013-05-14 NOTE — Telephone Encounter (Signed)
Pt needs 10 day follow up  Pt needs late afternoon appointment  Or Friday after 12 Please advise date and time

## 2013-05-14 NOTE — Assessment & Plan Note (Addendum)
Blood pressure had been under good control.  Now remaining elevated.  Will add HCTZ 25mg  q day to her regimen.  Will have him to continue to spot check his pressures.   Follow metabolic panel.  Get him back in soon to reassess.  Stop meloxicam.

## 2013-05-16 ENCOUNTER — Encounter: Payer: Self-pay | Admitting: Internal Medicine

## 2013-05-16 DIAGNOSIS — M549 Dorsalgia, unspecified: Secondary | ICD-10-CM | POA: Insufficient documentation

## 2013-05-16 DIAGNOSIS — H539 Unspecified visual disturbance: Secondary | ICD-10-CM | POA: Insufficient documentation

## 2013-05-16 NOTE — Assessment & Plan Note (Signed)
Avoid sleeping supine and avoid sedating medication.    

## 2013-05-16 NOTE — Assessment & Plan Note (Signed)
Recheck psa with next labs.    

## 2013-05-16 NOTE — Assessment & Plan Note (Signed)
Controlled.  Same medication.  Follow.   

## 2013-05-16 NOTE — Assessment & Plan Note (Signed)
Has resolved now.  He felt related to hemorrhoidal bleeding.  Discussed further GI evaluation.  Was referred.  Planning for colonoscopy soon.  Will need to post pone until can get blood pressure under control, etc.

## 2013-05-16 NOTE — Assessment & Plan Note (Signed)
Skin lesion - right buttock.  Took Doxycycline.  Better.  Apply bactroban topically.  Notify me if persistent.

## 2013-05-16 NOTE — Assessment & Plan Note (Signed)
Being followed.  Last lab check from [facility] - liver panel wnl.   

## 2013-05-16 NOTE — Assessment & Plan Note (Signed)
Vision change as outlined.  Has been worked up previously.  Felt to be a migraine variant.  Will refer back to neurology for further evaluation and treatment.

## 2013-05-16 NOTE — Assessment & Plan Note (Signed)
Being followed by Dr Solum.  Continue diet and exercise.  Follow.       

## 2013-05-16 NOTE — Assessment & Plan Note (Signed)
Followed by Dr [facility].  Stable.  On Humara.  

## 2013-05-16 NOTE — Assessment & Plan Note (Signed)
Back and leg pain as outlined.  Needs to f/u with Dr Sharlet Salina.  Will stop meloxicam.  Can titrate the neurontin.  Follow.

## 2013-05-17 NOTE — Telephone Encounter (Signed)
Just have him come on Thursday 05/27/13 at 4:15.  Just tell him he is being worked in at the end of the day.   May have to wait.

## 2013-05-17 NOTE — Telephone Encounter (Signed)
Appointment made pt aware

## 2013-05-27 ENCOUNTER — Ambulatory Visit (INDEPENDENT_AMBULATORY_CARE_PROVIDER_SITE_OTHER): Payer: BC Managed Care – PPO | Admitting: Internal Medicine

## 2013-05-27 ENCOUNTER — Encounter: Payer: Self-pay | Admitting: Internal Medicine

## 2013-05-27 VITALS — BP 130/70 | HR 86 | Temp 98.3°F | Ht 67.5 in | Wt 197.0 lb

## 2013-05-27 DIAGNOSIS — R972 Elevated prostate specific antigen [PSA]: Secondary | ICD-10-CM

## 2013-05-27 DIAGNOSIS — G4733 Obstructive sleep apnea (adult) (pediatric): Secondary | ICD-10-CM

## 2013-05-27 DIAGNOSIS — L989 Disorder of the skin and subcutaneous tissue, unspecified: Secondary | ICD-10-CM

## 2013-05-27 DIAGNOSIS — K219 Gastro-esophageal reflux disease without esophagitis: Secondary | ICD-10-CM

## 2013-05-27 DIAGNOSIS — E119 Type 2 diabetes mellitus without complications: Secondary | ICD-10-CM

## 2013-05-27 DIAGNOSIS — I10 Essential (primary) hypertension: Secondary | ICD-10-CM

## 2013-05-27 DIAGNOSIS — L4050 Arthropathic psoriasis, unspecified: Secondary | ICD-10-CM

## 2013-05-27 DIAGNOSIS — K625 Hemorrhage of anus and rectum: Secondary | ICD-10-CM

## 2013-05-27 DIAGNOSIS — R7989 Other specified abnormal findings of blood chemistry: Secondary | ICD-10-CM

## 2013-05-27 DIAGNOSIS — M549 Dorsalgia, unspecified: Secondary | ICD-10-CM

## 2013-05-27 DIAGNOSIS — R945 Abnormal results of liver function studies: Secondary | ICD-10-CM

## 2013-05-27 DIAGNOSIS — H539 Unspecified visual disturbance: Secondary | ICD-10-CM

## 2013-05-27 MED ORDER — TRIAMCINOLONE ACETONIDE 55 MCG/ACT NA AERO
2.0000 | INHALATION_SPRAY | Freq: Every day | NASAL | Status: DC
Start: 2013-05-27 — End: 2015-08-11

## 2013-05-27 NOTE — Progress Notes (Signed)
Pre visit review using our clinic review tool, if applicable. No additional management support is needed unless otherwise documented below in the visit note. 

## 2013-06-06 ENCOUNTER — Encounter: Payer: Self-pay | Admitting: Internal Medicine

## 2013-06-06 NOTE — Assessment & Plan Note (Signed)
Blood pressure had been under good control.   HCTZ 25mg  q day was added to her regimen.  Blood pressure better.  Will have him to continue to spot check his pressures.   Follow metabolic panel.   Remain off mobic.

## 2013-06-06 NOTE — Assessment & Plan Note (Signed)
Being followed.  Last lab check from [facility] - liver panel wnl.   

## 2013-06-06 NOTE — Assessment & Plan Note (Signed)
Has resolved now.  He felt related to hemorrhoidal bleeding.  Discussed further GI evaluation.  Was referred.  Was planning for colonoscopy.   Will had to post pone until blood pressure under control, etc.  Will reschedule when able.

## 2013-06-06 NOTE — Assessment & Plan Note (Signed)
Controlled.  Same medication.  Follow.   

## 2013-06-06 NOTE — Progress Notes (Signed)
Subjective:    Patient ID: Shane Mcknight, male    DOB: 21-Aug-1950, 63 y.o.   MRN: [id number]  HPI 63 year old male with past history of hypertension, diabetes mellitus, psoriatic arthritis (followed by Dr Jefm Bryant) and elevated liver enzymes who comes in today for a scheduled follow up.  He was scheduled for a blood pressure follow up.  Seeing Dr Gabriel Carina for his diabetes.  Sugars doing much better.   No abdominal pain or cramping.  Continues to see Dr Jefm Bryant.  On Humara now.   Had been having increased pain in his low back and right buttock and right leg extending in to the right groin.  This is much better.  Was on mobic.  This was stopped secondary to his blood pressure remaining elevated.  HCTZ was also added to his medication regimen.  He reports the lesion on his right lower buttock is better.  No chest pain or tightness.  No sob.  Blood pressure checks are averaging 140-150s/80s.   Last visit he reported that he had noticed the vision change again.   Had changes in his vision in the shape of a boomerang.  Similar to previous episodes.   Had neuro w/up and opthalmology w/up previously.  Felt to be a migraine variant.  No headache associated with these vision changes.  Has not had further episodes since his last visit.      Past Medical History  Diagnosis Date  . Hypertension   . Diabetes mellitus     type 2  . Abnormal liver function tests   . Psoriasis   . Hiatal hernia   . Psoriatic arthritis   . Sleep apnea     Current Outpatient Prescriptions on File Prior to Visit  Medication Sig Dispense Refill  . Adalimumab (HUMIRA PEN [place]) Inject into the skin every 7 (seven) days.       Marland Kitchen aspirin 81 MG tablet Take 81 mg by mouth daily.      Marland Kitchen atenolol (TENORMIN) 50 MG tablet Take 1 tablet (50 mg total) by mouth 2 (two) times daily. MUST KEEP APPT ON 04/09/13 FOR FURTHER REFILLS  60 tablet  3  . fish oil-omega-3 fatty acids 1000 MG capsule Take 2 g by mouth daily.      . folic acid (FOLVITE) 1 MG  tablet Take 1 mg by mouth daily.      Marland Kitchen gabapentin (NEURONTIN) 300 MG capsule Take 1 capsule (300 mg total) by mouth 3 (three) times daily.  90 capsule  1  . hydrochlorothiazide (HYDRODIURIL) 25 MG tablet Take 1 tablet (25 mg total) by mouth daily.  30 tablet  1  . leflunomide (ARAVA) 10 MG tablet Take 10 mg by mouth daily.      Marland Kitchen losartan (COZAAR) 50 MG tablet Take 50 mg by mouth daily.      . metFORMIN (GLUCOPHAGE) 500 MG tablet 4 tablets daily at supper      . Multiple Vitamin (MULTIVITAMIN) tablet Take 1 tablet by mouth daily.      Marland Kitchen NEXIUM 40 MG capsule TAKE ONE CAPSULE BY MOUTH TWICE DAILY  60 capsule  5  . vitamin E 400 UNIT capsule Take 400 Units by mouth daily.      . meloxicam (MOBIC) 15 MG tablet Take 15 mg by mouth daily.       No current facility-administered medications on file prior to visit.    Review of Systems Patient denies any headache.  No lightheadedness or dizziness.  No  significant sinus issues.   No chest pain, tightness or palpitations.  No increased shortness of breath.  No cough or congestion.   No nausea or vomiting.  No significant abdominal pain or cramping.  No BRBPR or melana now.  Previous rectal bleeding as outlined.  The previous loose stool has resolved.  Bowels back to normal.   No urine change.  Blood pressures better.  Off mobic.  Pain is better.          Objective:   Physical Exam  Filed Vitals:   05/27/13 1640  BP: 130/70  Pulse: 86  Temp: 98.3 F (36.8 C)   Blood pressure recheck:  74-3/39  63 year old male in no acute distress.  HEENT:  Nares - clear.  Oropharynx - without lesions. NECK:  Supple.  Nontender.  No audible carotid bruit.  HEART:  Appears to be regular.   LUNGS:  No crackles or wheezing audible.  Respirations even and unlabored.   RADIAL PULSE:  Equal bilaterally.  ABDOMEN:  Soft.  Nontender.  Bowel sounds present and normal.  No audible abdominal bruit.   EXTREMITIES:  No increased edema present.  DP pulses palpable and  equal bilaterally.             Assessment & Plan:   PREVIOUS LOOSE STOOL.  Resolved now.  Bowels normal now.    RENAL CYST.  Previous renal ultrasound revealed a left parapelvic cyst.  Follow.     HEALTH MAINTENANCE.  Physical 04/09/13.  PSA 04/29/13 - 1.62.  Colonoscopy 2010 - sigmoid polyp and internal hemorrhoids.  Was referred to GI for intermittent rectal bleeding.  Was scheduled to have a colonoscopy.   This was postponed until we get the blood pressure under better control, etc. Will reschedule.    I spent 25 minutes with the patient and more than 50% of the time was spent in consultation regarding the above.

## 2013-06-06 NOTE — Assessment & Plan Note (Signed)
Followed by Dr [facility].  Stable.  On Humara.  

## 2013-06-06 NOTE — Assessment & Plan Note (Signed)
Skin lesion - right buttock.  Took Doxycycline.  Better.  Applying bactroban topically.  Improved.  Will notify me if does not continue to improve and resolve.     

## 2013-06-06 NOTE — Assessment & Plan Note (Signed)
Recheck psa with next labs.    

## 2013-06-06 NOTE — Assessment & Plan Note (Signed)
Avoid sleeping supine and avoid sedating medication.    

## 2013-06-06 NOTE — Assessment & Plan Note (Signed)
Being followed by Dr Solum.  Continue diet and exercise.  Follow.       

## 2013-06-06 NOTE — Assessment & Plan Note (Signed)
Back and leg pain improved.    

## 2013-06-06 NOTE — Assessment & Plan Note (Signed)
Vision change as outlined.  Has been worked up previously.  Felt to be a migraine variant.  refered back to neurology for further evaluation and treatment.

## 2013-06-15 ENCOUNTER — Telehealth: Payer: Self-pay | Admitting: Internal Medicine

## 2013-06-15 NOTE — Telephone Encounter (Signed)
Patient unable to reschedule 6/5 appt to 6/15. Patient request Friday only appt after 11am. Please advised where to place patient. msn

## 2013-06-15 NOTE — Telephone Encounter (Signed)
See Larene Beach.  I discussed with her about how to help with this.

## 2013-06-18 ENCOUNTER — Ambulatory Visit: Payer: BC Managed Care – PPO | Admitting: Internal Medicine

## 2013-06-20 ENCOUNTER — Other Ambulatory Visit: Payer: Self-pay | Admitting: Internal Medicine

## 2013-06-23 ENCOUNTER — Encounter: Payer: Self-pay | Admitting: Internal Medicine

## 2013-06-23 NOTE — Telephone Encounter (Signed)
Please advise where to place this patient. All appts that have been offered to the patient he has refused. Pt is available after 3pm M-Th and after 11am on Friday. Please see all message and advise/msn

## 2013-06-28 ENCOUNTER — Ambulatory Visit: Payer: BC Managed Care – PPO | Admitting: Internal Medicine

## 2013-06-28 ENCOUNTER — Ambulatory Visit (INDEPENDENT_AMBULATORY_CARE_PROVIDER_SITE_OTHER): Payer: BC Managed Care – PPO | Admitting: Internal Medicine

## 2013-06-28 ENCOUNTER — Encounter: Payer: Self-pay | Admitting: Internal Medicine

## 2013-06-28 VITALS — BP 110/70 | HR 87 | Temp 98.3°F | Ht 67.5 in | Wt 195.0 lb

## 2013-06-28 DIAGNOSIS — M545 Low back pain, unspecified: Secondary | ICD-10-CM

## 2013-06-28 DIAGNOSIS — R945 Abnormal results of liver function studies: Secondary | ICD-10-CM

## 2013-06-28 DIAGNOSIS — K219 Gastro-esophageal reflux disease without esophagitis: Secondary | ICD-10-CM

## 2013-06-28 DIAGNOSIS — I10 Essential (primary) hypertension: Secondary | ICD-10-CM

## 2013-06-28 DIAGNOSIS — K625 Hemorrhage of anus and rectum: Secondary | ICD-10-CM

## 2013-06-28 DIAGNOSIS — R7989 Other specified abnormal findings of blood chemistry: Secondary | ICD-10-CM

## 2013-06-28 DIAGNOSIS — L989 Disorder of the skin and subcutaneous tissue, unspecified: Secondary | ICD-10-CM

## 2013-06-28 DIAGNOSIS — R972 Elevated prostate specific antigen [PSA]: Secondary | ICD-10-CM

## 2013-06-28 DIAGNOSIS — G4733 Obstructive sleep apnea (adult) (pediatric): Secondary | ICD-10-CM

## 2013-06-28 DIAGNOSIS — H539 Unspecified visual disturbance: Secondary | ICD-10-CM

## 2013-06-28 DIAGNOSIS — L4050 Arthropathic psoriasis, unspecified: Secondary | ICD-10-CM

## 2013-06-28 NOTE — Progress Notes (Signed)
Pre visit review using our clinic review tool, if applicable. No additional management support is needed unless otherwise documented below in the visit note. 

## 2013-07-06 DIAGNOSIS — M1990 Unspecified osteoarthritis, unspecified site: Secondary | ICD-10-CM | POA: Insufficient documentation

## 2013-07-07 ENCOUNTER — Ambulatory Visit: Payer: BC Managed Care – PPO | Admitting: Internal Medicine

## 2013-07-10 ENCOUNTER — Encounter: Payer: Self-pay | Admitting: Internal Medicine

## 2013-07-10 NOTE — Assessment & Plan Note (Signed)
Blood pressure much better.  Continue current medication regimen.  Follow.

## 2013-07-10 NOTE — Assessment & Plan Note (Signed)
Recheck psa with next lab.

## 2013-07-10 NOTE — Assessment & Plan Note (Signed)
Being followed.  Last lab check from [facility] - liver panel wnl.

## 2013-07-10 NOTE — Assessment & Plan Note (Signed)
Followed by Dr Jefm Bryant.  Stable.  On Humara.

## 2013-07-10 NOTE — Assessment & Plan Note (Signed)
Vision change as outlined.  Has been worked up previously.  Felt to be a migraine variant.  refered back to neurology for further evaluation and treatment.  Felt no further w/up warranted.  Felt related to migraine variant.

## 2013-07-10 NOTE — Progress Notes (Signed)
Subjective:    Patient ID: Shane Mcknight, male    DOB: 1950-08-17, 63 y.o.   MRN: [id number]  HPI 63 year old male with past history of hypertension, diabetes mellitus, psoriatic arthritis (followed by Dr Jefm Bryant) and elevated liver enzymes who comes in today for a scheduled follow up.  He was scheduled for a blood pressure follow up.  Seeing Dr Gabriel Carina for his diabetes.  Sugars doing much better.   No abdominal pain or cramping.  Continues to see Dr Jefm Bryant.  On Humara now.  Has f/u with Dr Jefm Bryant in 7/15.   Had been having increased pain in his low back and right buttock and right leg extending in to the right groin.  This is much better.  Was on mobic.  This was stopped secondary to his blood pressure remaining elevated.  HCTZ was also added to his medication regimen.  He reports the lesion on his right lower buttock is better.  No chest pain or tightness.  No sob.  Blood pressure checks are now averaging 118-130/70s. He previously reported that he had noticed the vision change again.   Had changes in his vision in the shape of a boomerang.  Similar to previous episodes.   Had neuro w/up and opthalmology w/up previously.  Felt to be a migraine variant.  Referred back to neurology and recently saw them.  Again felt related to migraine.  No further w/up felt warranted.  No headache associated with these vision changes.  Has not had further episodes since his last visit.  Buttock lesion is better.        Past Medical History  Diagnosis Date  . Hypertension   . Diabetes mellitus     type 2  . Abnormal liver function tests   . Psoriasis   . Hiatal hernia   . Psoriatic arthritis   . Sleep apnea     Current Outpatient Prescriptions on File Prior to Visit  Medication Sig Dispense Refill  . Adalimumab (HUMIRA PEN [place]) Inject into the skin every 7 (seven) days.       Marland Kitchen aspirin 81 MG tablet Take 81 mg by mouth daily.      Marland Kitchen atenolol (TENORMIN) 50 MG tablet TAKE 1 TABLET BY MOUTH TWICE DAILY  60  tablet  5  . fish oil-omega-3 fatty acids 1000 MG capsule Take 2 g by mouth daily.      . folic acid (FOLVITE) 1 MG tablet Take 1 mg by mouth daily.      Marland Kitchen gabapentin (NEURONTIN) 300 MG capsule Take 1 capsule (300 mg total) by mouth 3 (three) times daily.  90 capsule  1  . hydrochlorothiazide (HYDRODIURIL) 25 MG tablet Take 1 tablet (25 mg total) by mouth daily.  30 tablet  1  . leflunomide (ARAVA) 10 MG tablet Take 10 mg by mouth daily.      Marland Kitchen losartan (COZAAR) 50 MG tablet Take 50 mg by mouth daily.      . meloxicam (MOBIC) 15 MG tablet Take 15 mg by mouth daily.      . metFORMIN (GLUCOPHAGE) 500 MG tablet 4 tablets daily at supper      . Multiple Vitamin (MULTIVITAMIN) tablet Take 1 tablet by mouth daily.      Marland Kitchen NEXIUM 40 MG capsule TAKE ONE CAPSULE BY MOUTH TWICE DAILY  60 capsule  5  . triamcinolone (NASACORT AQ) 55 MCG/ACT AERO nasal inhaler Place 2 sprays into the nose daily.  1 Inhaler  3  .  vitamin E 400 UNIT capsule Take 400 Units by mouth daily.       No current facility-administered medications on file prior to visit.    Review of Systems Patient denies any headache.  No lightheadedness or dizziness.  No significant sinus issues.   No chest pain, tightness or palpitations.  No increased shortness of breath.  No cough or congestion.   No nausea or vomiting.  No significant abdominal pain or cramping.  No BRBPR or melana now.  The previous loose stool has resolved.  Bowels back to normal. No urine change.  Blood pressures better.  Off mobic.  Pain is better.          Objective:   Physical Exam  Filed Vitals:   06/28/13 1533  BP: 110/70  Pulse: 87  Temp: 98.3 F (36.8 C)   Blood pressure recheck:  16/58  63 year old male in no acute distress.  HEENT:  Nares - clear.  Oropharynx - without lesions. NECK:  Supple.  Nontender.  No audible carotid bruit.  HEART:  Appears to be regular.   LUNGS:  No crackles or wheezing audible.  Respirations even and unlabored.   RADIAL  PULSE:  Equal bilaterally.  ABDOMEN:  Soft.  Nontender.  Bowel sounds present and normal.  No audible abdominal bruit.   No significant pain in the right groin.  Much improved.   EXTREMITIES:  No increased edema present.  DP pulses palpable and equal bilaterally.      SKIN:  Buttock lesion - improved.          Assessment & Plan:   PREVIOUS LOOSE STOOL.  Resolved now.  Bowels normal now.    RENAL CYST.  Previous renal ultrasound revealed a left parapelvic cyst.  Follow.     HEALTH MAINTENANCE.  Physical 04/09/13.  PSA 04/29/13 - 1.62.  Colonoscopy 2010 - sigmoid polyp and internal hemorrhoids.  Was referred to GI for intermittent rectal bleeding.  Was scheduled to have a colonoscopy.   This was postponed until we get the blood pressure under better control, etc. Will reschedule.    I spent 25 minutes with the patient and more than 50% of the time was spent in consultation regarding the above.

## 2013-07-10 NOTE — Assessment & Plan Note (Signed)
Controlled.  Same medication.  Follow.

## 2013-07-10 NOTE — Assessment & Plan Note (Signed)
Avoid sleeping supine and avoid sedating medication.

## 2013-07-10 NOTE — Assessment & Plan Note (Signed)
Has resolved now.  He felt related to hemorrhoidal bleeding.  Discussed further GI evaluation.  Was referred.  Was planning for colonoscopy.   Will had to post pone until blood pressure under control, etc.  Will reschedule.

## 2013-07-10 NOTE — Assessment & Plan Note (Signed)
Back and leg pain improved.

## 2013-07-10 NOTE — Assessment & Plan Note (Signed)
Skin lesion - right buttock.  Took Doxycycline.  Better.  Applying bactroban topically.  Improved.  Will notify me if does not continue to improve and resolve.

## 2013-07-11 ENCOUNTER — Other Ambulatory Visit: Payer: Self-pay | Admitting: Internal Medicine

## 2013-08-09 ENCOUNTER — Other Ambulatory Visit: Payer: Self-pay | Admitting: Internal Medicine

## 2013-08-12 LAB — HEMOGLOBIN A1C: HEMOGLOBIN A1C: 5.7 % (ref 4.0–6.0)

## 2013-08-12 LAB — BASIC METABOLIC PANEL: GLUCOSE: 117 mg/dL

## 2013-08-30 ENCOUNTER — Ambulatory Visit: Payer: Self-pay | Admitting: Gastroenterology

## 2013-08-30 LAB — HM COLONOSCOPY

## 2013-09-02 LAB — PATHOLOGY REPORT

## 2013-09-06 ENCOUNTER — Other Ambulatory Visit: Payer: Self-pay | Admitting: Internal Medicine

## 2013-09-09 ENCOUNTER — Encounter: Payer: Self-pay | Admitting: Internal Medicine

## 2013-09-09 DIAGNOSIS — Z86010 Personal history of colonic polyps: Secondary | ICD-10-CM

## 2013-09-10 DIAGNOSIS — Z86010 Personal history of colonic polyps: Secondary | ICD-10-CM | POA: Insufficient documentation

## 2013-09-21 ENCOUNTER — Encounter: Payer: Self-pay | Admitting: Internal Medicine

## 2013-09-27 ENCOUNTER — Encounter: Payer: Self-pay | Admitting: Internal Medicine

## 2013-10-06 ENCOUNTER — Encounter: Payer: Self-pay | Admitting: Internal Medicine

## 2013-10-08 ENCOUNTER — Other Ambulatory Visit: Payer: Self-pay | Admitting: Internal Medicine

## 2013-10-25 DIAGNOSIS — R945 Abnormal results of liver function studies: Secondary | ICD-10-CM | POA: Insufficient documentation

## 2013-10-29 ENCOUNTER — Telehealth: Payer: Self-pay | Admitting: Internal Medicine

## 2013-10-29 ENCOUNTER — Encounter: Payer: Self-pay | Admitting: Internal Medicine

## 2013-10-29 ENCOUNTER — Ambulatory Visit (INDEPENDENT_AMBULATORY_CARE_PROVIDER_SITE_OTHER): Payer: BC Managed Care – PPO | Admitting: Internal Medicine

## 2013-10-29 VITALS — BP 110/60 | HR 64 | Temp 98.2°F | Ht 67.5 in | Wt 201.0 lb

## 2013-10-29 DIAGNOSIS — G4733 Obstructive sleep apnea (adult) (pediatric): Secondary | ICD-10-CM

## 2013-10-29 DIAGNOSIS — R7989 Other specified abnormal findings of blood chemistry: Secondary | ICD-10-CM

## 2013-10-29 DIAGNOSIS — E119 Type 2 diabetes mellitus without complications: Secondary | ICD-10-CM

## 2013-10-29 DIAGNOSIS — L989 Disorder of the skin and subcutaneous tissue, unspecified: Secondary | ICD-10-CM

## 2013-10-29 DIAGNOSIS — R972 Elevated prostate specific antigen [PSA]: Secondary | ICD-10-CM

## 2013-10-29 DIAGNOSIS — R945 Abnormal results of liver function studies: Secondary | ICD-10-CM

## 2013-10-29 DIAGNOSIS — I10 Essential (primary) hypertension: Secondary | ICD-10-CM

## 2013-10-29 DIAGNOSIS — K219 Gastro-esophageal reflux disease without esophagitis: Secondary | ICD-10-CM

## 2013-10-29 DIAGNOSIS — Z86010 Personal history of colonic polyps: Secondary | ICD-10-CM

## 2013-10-29 DIAGNOSIS — K625 Hemorrhage of anus and rectum: Secondary | ICD-10-CM

## 2013-10-29 DIAGNOSIS — L4050 Arthropathic psoriasis, unspecified: Secondary | ICD-10-CM

## 2013-10-29 DIAGNOSIS — E7800 Pure hypercholesterolemia, unspecified: Secondary | ICD-10-CM

## 2013-10-29 NOTE — Telephone Encounter (Signed)
Need date of his last tetanus from [facility].  (or they can just send him immunization sheet).  Thanks.

## 2013-10-29 NOTE — Telephone Encounter (Signed)
Records requested

## 2013-10-31 ENCOUNTER — Encounter: Payer: Self-pay | Admitting: Internal Medicine

## 2013-10-31 NOTE — Progress Notes (Signed)
Subjective:    Patient ID: Shane Mcknight, male    DOB: 1950-09-21, 62 y.o.   MRN: [id number]  HPI 63 year old male with past history of hypertension, diabetes mellitus, psoriatic arthritis (followed by Dr Jefm Bryant) and elevated liver enzymes who comes in today for a scheduled follow up.  Had been  seeing Dr Gabriel Carina for his diabetes.  Sugars doing well.  She has released him.   Sugars in the am averaging 90-100.  No abdominal pain or cramping.  Continues to see Dr Jefm Bryant.  On Humira.  Stable.   Had been having increased pain in his low back and right buttock and right leg extending in to his groin.  Pain much better now.  Not really and issue.  HCTZ was also added to his medication regimen.  Blood pressure better.  Doing well.  He reports the lesion on his right lower buttock is better, essentially resolved.  No chest pain or tightness.  No sob.  No vision changes reported.  Overall he feels he is doing better.          Past Medical History  Diagnosis Date  . Hypertension   . Diabetes mellitus     type 2  . Abnormal liver function tests   . Psoriasis   . Hiatal hernia   . Psoriatic arthritis   . Sleep apnea     Current Outpatient Prescriptions on File Prior to Visit  Medication Sig Dispense Refill  . Adalimumab (HUMIRA PEN [place]) Inject into the skin every 7 (seven) days.       Marland Kitchen aspirin 81 MG tablet Take 81 mg by mouth daily.      Marland Kitchen atenolol (TENORMIN) 50 MG tablet TAKE 1 TABLET BY MOUTH TWICE DAILY  60 tablet  5  . fish oil-omega-3 fatty acids 1000 MG capsule Take 2 g by mouth daily.      . folic acid (FOLVITE) 1 MG tablet Take 1 mg by mouth daily.      . hydrochlorothiazide (HYDRODIURIL) 25 MG tablet TAKE 1 TABLET BY MOUTH DAILY.  30 tablet  0  . losartan (COZAAR) 50 MG tablet Take 50 mg by mouth daily.      . metFORMIN (GLUCOPHAGE) 500 MG tablet 4 tablets daily at supper      . Multiple Vitamin (MULTIVITAMIN) tablet Take 1 tablet by mouth daily.      Marland Kitchen NEXIUM 40 MG capsule TAKE ONE  CAPSULE BY MOUTH TWICE DAILY  60 capsule  5  . triamcinolone (NASACORT AQ) 55 MCG/ACT AERO nasal inhaler Place 2 sprays into the nose daily.  1 Inhaler  3  . vitamin E 400 UNIT capsule Take 400 Units by mouth daily.       No current facility-administered medications on file prior to visit.    Review of Systems Patient denies any headache.  No lightheadedness or dizziness.  No vision changes reported.  No significant sinus issues.   No chest pain, tightness or palpitations.  No increased shortness of breath.  No cough or congestion.   No nausea or vomiting.  No significant abdominal pain or cramping.  No BRBPR or melana now.  The previous loose stool has resolved. Bowels back to normal. No urine change.  Blood pressures better.  Off mobic.  Pain is better.   Feels better.  Sugars as outlined.        Objective:   Physical Exam  Filed Vitals:   10/29/13 1406  BP: 110/60  Pulse: 64  Temp: 98.2 F (36.8 C)   Blood pressure recheck:  59/34  63 year old male in no acute distress.  HEENT:  Nares - clear.  Oropharynx - without lesions. NECK:  Supple.  Nontender.  No audible carotid bruit.  HEART:  Appears to be regular.   LUNGS:  No crackles or wheezing audible.  Respirations even and unlabored.   RADIAL PULSE:  Equal bilaterally.  ABDOMEN:  Soft.  Nontender.  Bowel sounds present and normal.  No audible abdominal bruit.   No significant pain in the right groin.  Much improved.   EXTREMITIES:  No increased edema present.  DP pulses palpable and equal bilaterally.         Assessment & Plan:  1. Essential hypertension Blood pressure under good control now.  Follow metabolic panel.  2. Obstructive sleep apnea Avoid sleeping supine and sedating medication.  Follow.   3. Gastroesophageal reflux disease without esophagitis Controlled on nexium.  Follow.   4. Rectal bleeding Just had colonoscopy in 8/15.  Tubular adenomatous polyps.  Recommended f/u colonoscopy in 3 years.  No further  bleeding.    5. Type 2 diabetes mellitus without complication Sugars controlled.  Was seeing Dr Gabriel Carina.  She released.  Will follow.    Lab Results  Component Value Date   HGBA1C 5.4 07/24/2012   6. Psoriatic arthritis Seeing Dr Jefm Bryant.  On Humira.  Stable.    7. Skin lesion Better.   8. Abnormal liver function tests Apparently just had labs through rheumatology.  States ok.  Follow.    9. Elevated PSA measurement PSA wnl on last check, but increased from previous.  Check psa with next labs.    10. History of colonic polyps Colonoscopy 08/31/13 as outlined.  Tubular adenomatous polyps.  Recommended f/u colonoscopy in 3 years.    11.  RENAL CYST.  Previous renal ultrasound revealed a left parapelvic cyst.  Follow.     HEALTH MAINTENANCE.  Physical 04/09/13.  PSA 04/29/13 - 1.62.  Colonoscopy 2010 - sigmoid polyp and internal hemorrhoids.  Was referred to GI for intermittent rectal bleeding. Colonoscopy performed 08/31/13 - tubular adenomatous polyp x three.   Recommended f/u colonoscopy in 3 years.    I spent 25 minutes with the patient and more than 50% of the time was spent in consultation regarding the above.

## 2013-11-01 ENCOUNTER — Encounter: Payer: Self-pay | Admitting: Internal Medicine

## 2013-11-02 ENCOUNTER — Other Ambulatory Visit: Payer: Self-pay | Admitting: Internal Medicine

## 2013-11-08 ENCOUNTER — Encounter: Payer: Self-pay | Admitting: *Deleted

## 2013-11-08 ENCOUNTER — Encounter: Payer: Self-pay | Admitting: Internal Medicine

## 2013-11-12 ENCOUNTER — Encounter: Payer: Self-pay | Admitting: Internal Medicine

## 2013-11-18 ENCOUNTER — Encounter: Payer: Self-pay | Admitting: Internal Medicine

## 2013-12-02 ENCOUNTER — Other Ambulatory Visit: Payer: Self-pay | Admitting: Internal Medicine

## 2013-12-23 ENCOUNTER — Other Ambulatory Visit: Payer: Self-pay | Admitting: Internal Medicine

## 2014-01-26 ENCOUNTER — Encounter: Payer: Self-pay | Admitting: Internal Medicine

## 2014-01-26 ENCOUNTER — Other Ambulatory Visit (INDEPENDENT_AMBULATORY_CARE_PROVIDER_SITE_OTHER): Payer: BLUE CROSS/BLUE SHIELD

## 2014-01-26 DIAGNOSIS — R7989 Other specified abnormal findings of blood chemistry: Secondary | ICD-10-CM

## 2014-01-26 DIAGNOSIS — E7800 Pure hypercholesterolemia, unspecified: Secondary | ICD-10-CM

## 2014-01-26 DIAGNOSIS — R945 Abnormal results of liver function studies: Secondary | ICD-10-CM

## 2014-01-26 DIAGNOSIS — E119 Type 2 diabetes mellitus without complications: Secondary | ICD-10-CM

## 2014-01-26 DIAGNOSIS — R972 Elevated prostate specific antigen [PSA]: Secondary | ICD-10-CM

## 2014-01-26 DIAGNOSIS — K625 Hemorrhage of anus and rectum: Secondary | ICD-10-CM

## 2014-01-26 DIAGNOSIS — G4733 Obstructive sleep apnea (adult) (pediatric): Secondary | ICD-10-CM

## 2014-01-26 LAB — LIPID PANEL
CHOL/HDL RATIO: 6
Cholesterol: 177 mg/dL (ref 0–200)
HDL: 32 mg/dL — AB (ref >39.00)
LDL Cholesterol: 108 mg/dL — ABNORMAL HIGH (ref 0–99)
NONHDL: 145
TRIGLYCERIDES: 186 mg/dL — AB (ref 0.0–149.0)
VLDL: 37.2 mg/dL (ref 0.0–40.0)

## 2014-01-26 LAB — CBC WITH DIFFERENTIAL/PLATELET
BASOS ABS: 0 10*3/uL (ref 0.0–0.1)
Basophils Relative: 0.4 % (ref 0.0–3.0)
Eosinophils Absolute: 0.3 10*3/uL (ref 0.0–0.7)
Eosinophils Relative: 5 % (ref 0.0–5.0)
HEMATOCRIT: 42.5 % (ref 39.0–52.0)
HEMOGLOBIN: 14.3 g/dL (ref 13.0–17.0)
LYMPHS ABS: 1.8 10*3/uL (ref 0.7–4.0)
Lymphocytes Relative: 30.5 % (ref 12.0–46.0)
MCHC: 33.7 g/dL (ref 30.0–36.0)
MCV: 88.9 fl (ref 78.0–100.0)
Monocytes Absolute: 0.7 10*3/uL (ref 0.1–1.0)
Monocytes Relative: 11.8 % (ref 3.0–12.0)
NEUTROS ABS: 3.1 10*3/uL (ref 1.4–7.7)
Neutrophils Relative %: 52.3 % (ref 43.0–77.0)
PLATELETS: 203 10*3/uL (ref 150.0–400.0)
RBC: 4.78 Mil/uL (ref 4.22–5.81)
RDW: 15.1 % (ref 11.5–15.5)
WBC: 5.9 10*3/uL (ref 4.0–10.5)

## 2014-01-26 LAB — BASIC METABOLIC PANEL WITH GFR
BUN: 20 mg/dL (ref 6–23)
CO2: 25 meq/L (ref 19–32)
Calcium: 9.2 mg/dL (ref 8.4–10.5)
Chloride: 102 meq/L (ref 96–112)
Creatinine, Ser: 0.91 mg/dL (ref 0.40–1.50)
GFR: 88.78 mL/min
Glucose, Bld: 121 mg/dL — ABNORMAL HIGH (ref 70–99)
Potassium: 3.9 meq/L (ref 3.5–5.1)
Sodium: 137 meq/L (ref 135–145)

## 2014-01-26 LAB — HEMOGLOBIN A1C: Hgb A1c MFr Bld: 6.3 % (ref 4.6–6.5)

## 2014-01-26 LAB — HEPATIC FUNCTION PANEL
ALK PHOS: 42 U/L (ref 39–117)
ALT: 33 U/L (ref 0–53)
AST: 25 U/L (ref 0–37)
Albumin: 4.3 g/dL (ref 3.5–5.2)
Bilirubin, Direct: 0.1 mg/dL (ref 0.0–0.3)
TOTAL PROTEIN: 8.1 g/dL (ref 6.0–8.3)
Total Bilirubin: 0.5 mg/dL (ref 0.2–1.2)

## 2014-01-26 LAB — TSH: TSH: 2.07 u[IU]/mL (ref 0.35–4.50)

## 2014-01-26 LAB — PSA: PSA: 1.41 ng/mL (ref 0.10–4.00)

## 2014-01-28 ENCOUNTER — Ambulatory Visit (INDEPENDENT_AMBULATORY_CARE_PROVIDER_SITE_OTHER): Payer: BLUE CROSS/BLUE SHIELD | Admitting: Internal Medicine

## 2014-01-28 ENCOUNTER — Other Ambulatory Visit: Payer: Self-pay | Admitting: Internal Medicine

## 2014-01-28 ENCOUNTER — Encounter: Payer: Self-pay | Admitting: Internal Medicine

## 2014-01-28 VITALS — BP 120/64 | HR 69 | Temp 98.1°F | Ht 67.5 in | Wt 201.0 lb

## 2014-01-28 DIAGNOSIS — G4733 Obstructive sleep apnea (adult) (pediatric): Secondary | ICD-10-CM

## 2014-01-28 DIAGNOSIS — I10 Essential (primary) hypertension: Secondary | ICD-10-CM

## 2014-01-28 DIAGNOSIS — K219 Gastro-esophageal reflux disease without esophagitis: Secondary | ICD-10-CM

## 2014-01-28 DIAGNOSIS — L4050 Arthropathic psoriasis, unspecified: Secondary | ICD-10-CM

## 2014-01-28 DIAGNOSIS — Z86010 Personal history of colonic polyps: Secondary | ICD-10-CM

## 2014-01-28 DIAGNOSIS — E119 Type 2 diabetes mellitus without complications: Secondary | ICD-10-CM

## 2014-01-28 DIAGNOSIS — R945 Abnormal results of liver function studies: Secondary | ICD-10-CM

## 2014-01-28 DIAGNOSIS — R7989 Other specified abnormal findings of blood chemistry: Secondary | ICD-10-CM

## 2014-01-28 DIAGNOSIS — E7800 Pure hypercholesterolemia, unspecified: Secondary | ICD-10-CM

## 2014-01-28 NOTE — Progress Notes (Signed)
Pre visit review using our clinic review tool, if applicable. No additional management support is needed unless otherwise documented below in the visit note. 

## 2014-01-31 ENCOUNTER — Encounter: Payer: Self-pay | Admitting: Internal Medicine

## 2014-01-31 NOTE — Progress Notes (Signed)
Subjective:    Patient ID: Shane Mcknight, male    DOB: [birth year], 64 y.o.   MRN: [id number]  HPI 64 year old male with past history of hypertension, diabetes mellitus, psoriatic arthritis (followed by Dr Jefm Bryant) and elevated liver enzymes who comes in today for a scheduled follow up.  Had been  seeing Dr Gabriel Carina for his diabetes.  Sugars doing well.  She has released him.  Brought in no recorded sugar readings.   Trying to watch what he eats.  Her reports no abdominal pain or cramping.  Continues to see Dr Jefm Bryant.  On Humira.  Stable.   Had been having increased pain in his low back and right buttock and right leg extending in to his groin.  Pain much better now.  Not really an issue.  HCTZ was also added to his medication regimen.  Blood pressure better.  Doing well.  He reports the lesion on his right lower buttock has completely resolved.   No chest pain or tightness.  No sob.  No vision changes reported.  Overall he feels he is doing better.          Past Medical History  Diagnosis Date  . Hypertension   . Diabetes mellitus     type 2  . Abnormal liver function tests   . Psoriasis   . Hiatal hernia   . Psoriatic arthritis   . Sleep apnea     Current Outpatient Prescriptions on File Prior to Visit  Medication Sig Dispense Refill  . Adalimumab (HUMIRA PEN [place]) Inject into the skin every 7 (seven) days.     Marland Kitchen aspirin 81 MG tablet Take 81 mg by mouth daily.    . fish oil-omega-3 fatty acids 1000 MG capsule Take 2 g by mouth daily.    . folic acid (FOLVITE) 1 MG tablet Take 1 mg by mouth daily.    Marland Kitchen gabapentin (NEURONTIN) 300 MG capsule Take 300 mg by mouth 3 (three) times daily as needed.    . hydrochlorothiazide (HYDRODIURIL) 25 MG tablet TAKE 1 TABLET BY MOUTH DAILY 30 tablet 5  . metFORMIN (GLUCOPHAGE) 500 MG tablet 4 tablets daily at supper    . Multiple Vitamin (MULTIVITAMIN) tablet Take 1 tablet by mouth daily.    Marland Kitchen NEXIUM 40 MG capsule TAKE ONE CAPSULE BY MOUTH TWICE DAILY 60  capsule 5  . triamcinolone (NASACORT AQ) 55 MCG/ACT AERO nasal inhaler Place 2 sprays into the nose daily. 1 Inhaler 3  . vitamin E 400 UNIT capsule Take 400 Units by mouth daily.     No current facility-administered medications on file prior to visit.    Review of Systems Patient denies any headache.  No lightheadedness or dizziness.  No vision changes reported.  No significant sinus issues.   No chest pain, tightness or palpitations.  No increased shortness of breath.  No cough or congestion.   No nausea or vomiting.  No significant abdominal pain or cramping.  No BRBPR or melana.  No urine change.  Blood pressures better.  Off mobic.  Pain is better.   Feels better.        Objective:   Physical Exam  Filed Vitals:   01/28/14 1426  BP: 120/64  Pulse: 69  Temp: 98.1 F (53.47 C)   64 year old male in no acute distress.  HEENT:  Nares - clear.  Oropharynx - without lesions. NECK:  Supple.  Nontender.  No audible carotid bruit.  HEART:  Appears to  be regular.   LUNGS:  No crackles or wheezing audible.  Respirations even and unlabored.   RADIAL PULSE:  Equal bilaterally.  ABDOMEN:  Soft.  Nontender.  Bowel sounds present and normal.  No audible abdominal bruit.   No significant pain in the right groin.  Much improved.   EXTREMITIES:  No increased edema present.  DP pulses palpable and equal bilaterally.         Assessment & Plan:  1. Essential hypertension Blood pressure doing well on our check.  Have him to continue to spot check.  Same medication regimen.  Follow.    2. Obstructive sleep apnea Avoid sleeping supine and sedating medication.  Follow.    3. Gastroesophageal reflux disease without esophagitis Symptoms controlled.  On nexium.    4. Type 2 diabetes mellitus without complication Brought in no recorded sugar readings.  Low carb diet.  Follow.    5. Psoriatic arthritis On humira.  Seeing Dr Jefm Bryant.    6. History of colonic polyps Colonoscopy 08/30/13 - one 60mm  polyp in the transverse colon, three 1-71mm polyps in the mid sigmoid colon, one 23mm polyp in the distal sigmoid colon, non bleeding external and internal hemorrhoids and diverticulosis in the sigmoid and descending colon.  Pathology - tubular adenoma x 3. Recommended f/u colonoscopy in three years.  7. Abnormal liver function tests Being followed.  Recheck liver panel.    8.  RENAL CYST.  Previous renal ultrasound revealed a left parapelvic cyst.  Follow.     HEALTH MAINTENANCE.  Physical 04/09/13.  PSA 04/29/13 - 1.62.  Colonoscopy 2010 - sigmoid polyp and internal hemorrhoids.  Was referred to GI for intermittent rectal bleeding. Colonoscopy performed 08/31/13 - tubular adenomatous polyp x three.   Recommended f/u colonoscopy in 3 years.

## 2014-03-08 ENCOUNTER — Ambulatory Visit (INDEPENDENT_AMBULATORY_CARE_PROVIDER_SITE_OTHER): Payer: BLUE CROSS/BLUE SHIELD | Admitting: Nurse Practitioner

## 2014-03-08 ENCOUNTER — Encounter: Payer: Self-pay | Admitting: Nurse Practitioner

## 2014-03-08 VITALS — BP 118/62 | HR 60 | Temp 98.8°F | Resp 12 | Ht 67.5 in | Wt 200.1 lb

## 2014-03-08 DIAGNOSIS — J0110 Acute frontal sinusitis, unspecified: Secondary | ICD-10-CM

## 2014-03-08 MED ORDER — DOXYCYCLINE HYCLATE 100 MG PO TABS: 100.0000 mg | ORAL_TABLET | Freq: Two times a day (BID) | ORAL | Status: DC

## 2014-03-08 NOTE — Progress Notes (Signed)
Pre visit review using our clinic review tool, if applicable. No additional management support is needed unless otherwise documented below in the visit note. 

## 2014-03-08 NOTE — Progress Notes (Signed)
Subjective:    Patient ID: Shane Mcknight, male    DOB: Jun 06, 1950, 64 y.o.   MRN: [id number]  HPI  Mr. Natarajan is a 64 yo male with a CC of dark green nasal mucous.   1) Getting worse yesterday, 10 days, dark green from chest and nose some with blood streaked, facial pain behind eyes, left ear achy, headache all day, Friday 99.6 temp. Occular migraines last week.   Saline rinse- not helpful  Guaifesin- helpful  Tylenol- helpful  Caffeine- Helpful   Review of Systems  Constitutional: Positive for fatigue. Negative for fever, chills and diaphoresis.  HENT: Positive for congestion, ear pain, nosebleeds, postnasal drip, rhinorrhea, sinus pressure, sneezing and sore throat. Negative for ear discharge.   Eyes: Negative for visual disturbance.  Respiratory: Positive for cough. Negative for chest tightness, shortness of breath and wheezing.   Cardiovascular: Negative for chest pain, palpitations and leg swelling.  Gastrointestinal: Negative for nausea, vomiting and diarrhea.  Skin: Negative for rash.  Neurological: Positive for headaches. Negative for dizziness, weakness and numbness.   Past Medical History  Diagnosis Date  . Hypertension   . Diabetes mellitus     type 2  . Abnormal liver function tests   . Psoriasis   . Hiatal hernia   . Psoriatic arthritis   . Sleep apnea     History   Social History  . Marital Status: Married    Spouse Name: N/A  . Number of Children: N/A  . Years of Education: N/A   Occupational History  . Not on file.   Social History Main Topics  . Smoking status: Former Smoker    Quit date: 07/06/1982  . Smokeless tobacco: Never Used  . Alcohol Use: No  . Drug Use: No  . Sexual Activity: Not on file   Other Topics Concern  . Not on file   Social History Narrative    Past Surgical History  Procedure Laterality Date  . Cholecystectomy    . Rotator cuff repair    . Arthroplasty      right thumb  . Carpal tunnel release      right  .  Lumbar disc surgery      Family History  Problem Relation Age of Onset  . Lung cancer Father   . Hypertension Mother     Allergies  Allergen Reactions  . Amoxicillin   . Ephedrine Other (See Comments)    pain  . Levsin [Hyoscyamine Sulfate] Other (See Comments)    Urinary retention  . Naprosyn [Naproxen] Other (See Comments)    GI upset   . Penicillins   . Sudafed [Pseudoephedrine Hcl]   . Vancomycin   . Shellfish Allergy Rash    Current Outpatient Prescriptions on File Prior to Visit  Medication Sig Dispense Refill  . Adalimumab (HUMIRA PEN [place]) Inject into the skin every 7 (seven) days.     Marland Kitchen aspirin 81 MG tablet Take 81 mg by mouth daily.    . fish oil-omega-3 fatty acids 1000 MG capsule Take 2 g by mouth daily.    . folic acid (FOLVITE) 1 MG tablet Take 1 mg by mouth daily.    Marland Kitchen gabapentin (NEURONTIN) 300 MG capsule Take 300 mg by mouth 3 (three) times daily as needed.    . hydrochlorothiazide (HYDRODIURIL) 25 MG tablet TAKE 1 TABLET BY MOUTH DAILY 30 tablet 5  . losartan (COZAAR) 50 MG tablet TAKE 1 TABLET BY MOUTH DAILY 90 tablet 0  .  metFORMIN (GLUCOPHAGE) 500 MG tablet 4 tablets daily at supper    . Multiple Vitamin (MULTIVITAMIN) tablet Take 1 tablet by mouth daily.    Marland Kitchen NEXIUM 40 MG capsule TAKE ONE CAPSULE BY MOUTH TWICE DAILY 60 capsule 5  . triamcinolone (NASACORT AQ) 55 MCG/ACT AERO nasal inhaler Place 2 sprays into the nose daily. 1 Inhaler 3  . vitamin E 400 UNIT capsule Take 400 Units by mouth daily.     No current facility-administered medications on file prior to visit.      Objective:   Physical Exam  Constitutional: He is oriented to person, place, and time. He appears well-developed and well-nourished. No distress.  BP 118/62 mmHg  Pulse 60  Temp(Src) 98.8 F (37.1 C) (Oral)  Resp 12  Ht 5' 7.5" (1.715 m)  Wt 200 lb 1.9 oz (90.774 kg)  BMI 30.86 kg/m2  SpO2 95%   HENT:  Head: Normocephalic and atraumatic.  Right Ear: External ear normal.    Left Ear: External ear normal.  Eyes: Right eye exhibits no discharge. Left eye exhibits no discharge. No scleral icterus.  Neck: Normal range of motion. Neck supple. No thyromegaly present.  Cardiovascular: Normal rate, regular rhythm and normal heart sounds.  Exam reveals no gallop and no friction rub.   No murmur heard. Pulmonary/Chest: Effort normal. No respiratory distress. He has no wheezes. He has no rales. He exhibits no tenderness.  Rhonchi scattered  Lymphadenopathy:    He has no cervical adenopathy.  Neurological: He is alert and oriented to person, place, and time.  Skin: Skin is warm and dry. No rash noted. He is not diaphoretic.  Psychiatric: He has a normal mood and affect. His behavior is normal. Judgment and thought content normal.      Assessment & Plan:

## 2014-03-08 NOTE — Patient Instructions (Signed)
Try doxycyline for 7 days. Take 1 pill twice daily each day.   Call us if not better after the 7 days or anytime it is worsening despite treatment.

## 2014-03-09 ENCOUNTER — Telehealth: Payer: Self-pay | Admitting: Internal Medicine

## 2014-03-09 ENCOUNTER — Other Ambulatory Visit: Payer: Self-pay

## 2014-03-09 MED ORDER — ATENOLOL 50 MG PO TABS: 50.0000 mg | ORAL_TABLET | Freq: Two times a day (BID) | ORAL | Status: DC

## 2014-03-09 NOTE — Telephone Encounter (Signed)
atenolol (TENORMIN) 50 MG tablet

## 2014-03-09 NOTE — Telephone Encounter (Signed)
Medication refilled and sent to pharmacy.

## 2014-03-11 DIAGNOSIS — J329 Chronic sinusitis, unspecified: Secondary | ICD-10-CM | POA: Insufficient documentation

## 2014-03-11 NOTE — Assessment & Plan Note (Signed)
10 days of worsening symptoms with conservative treatment. Will try doxycycline twice daily for 7 days. Okay to continue saline, tylenol, and mucinex. FU if worsening/failure to improve.

## 2014-04-03 ENCOUNTER — Other Ambulatory Visit: Payer: Self-pay | Admitting: Nurse Practitioner

## 2014-04-24 ENCOUNTER — Other Ambulatory Visit: Payer: Self-pay | Admitting: Internal Medicine

## 2014-04-26 ENCOUNTER — Other Ambulatory Visit: Payer: BLUE CROSS/BLUE SHIELD

## 2014-04-29 ENCOUNTER — Encounter: Payer: BLUE CROSS/BLUE SHIELD | Admitting: Internal Medicine

## 2014-04-29 ENCOUNTER — Ambulatory Visit
Admit: 2014-04-29 | Disposition: A | Discharge status: Home / Self Care | Payer: Self-pay | Attending: Physical Medicine and Rehabilitation | Admitting: Physical Medicine and Rehabilitation

## 2014-05-10 ENCOUNTER — Ambulatory Visit (INDEPENDENT_AMBULATORY_CARE_PROVIDER_SITE_OTHER): Payer: BLUE CROSS/BLUE SHIELD | Admitting: Internal Medicine

## 2014-05-10 ENCOUNTER — Ambulatory Visit
Admit: 2014-05-10 | Disposition: A | Discharge status: Home / Self Care | Payer: Self-pay | Attending: Internal Medicine | Admitting: Internal Medicine

## 2014-05-10 ENCOUNTER — Encounter: Payer: Self-pay | Admitting: Internal Medicine

## 2014-05-10 VITALS — BP 122/72 | HR 78 | Temp 98.8°F | Ht 67.5 in | Wt 195.0 lb

## 2014-05-10 DIAGNOSIS — M7989 Other specified soft tissue disorders: Secondary | ICD-10-CM | POA: Diagnosis not present

## 2014-05-10 DIAGNOSIS — I10 Essential (primary) hypertension: Secondary | ICD-10-CM | POA: Diagnosis not present

## 2014-05-10 DIAGNOSIS — L4050 Arthropathic psoriasis, unspecified: Secondary | ICD-10-CM

## 2014-05-10 NOTE — Assessment & Plan Note (Signed)
Blood pressure is well controlled.  Same medication regimen.

## 2014-05-10 NOTE — Assessment & Plan Note (Signed)
Increased swelling left leg.  Exam as outlined.  Will obtain lower extremity duplex to confirm no DVT.  Keep f/u with Dr Sharlet Salina - regarding the increased pain.

## 2014-05-10 NOTE — Assessment & Plan Note (Signed)
Sees Dr Jefm Bryant.  Increased back/leg pain.  Feels may be related to spinal stenosis.  Being treated for psoriatic arthritis.

## 2014-05-10 NOTE — Progress Notes (Signed)
Patient ID: Shane Mcknight, male   DOB: 1950/05/08, 64 y.o.   MRN: [id number]   Subjective:    Patient ID: Shane Mcknight, male    DOB: 04/18/1950, 64 y.o.   MRN: [id number]  HPI  Patient here as a work in with concerns regarding swelling - left leg. States he has been having increased pain - left knee and back and legs.  First noticed the swelling - yesterday.  Pain in his calf.  Has psoriatic arthritis.  Psoriasis rash on lower extremities.  No evidence of cellulitis.     Past Medical History  Diagnosis Date  . Hypertension   . Diabetes mellitus     type 2  . Abnormal liver function tests   . Psoriasis   . Hiatal hernia   . Psoriatic arthritis   . Sleep apnea     Outpatient Encounter Prescriptions as of 05/10/2014  Medication Sig  . Adalimumab (HUMIRA PEN [place]) Inject into the skin every 7 (seven) days.   Marland Kitchen aspirin 81 MG tablet Take 81 mg by mouth daily.  Marland Kitchen atenolol (TENORMIN) 50 MG tablet TAKE 1 TABLET BY MOUTH TWICE DAILY  . fish oil-omega-3 fatty acids 1000 MG capsule Take 2 g by mouth daily.  . folic acid (FOLVITE) 1 MG tablet Take 1 mg by mouth daily.  Marland Kitchen gabapentin (NEURONTIN) 300 MG capsule Take 300 mg by mouth 3 (three) times daily as needed.  . hydrochlorothiazide (HYDRODIURIL) 25 MG tablet TAKE 1 TABLET BY MOUTH DAILY  . metFORMIN (GLUCOPHAGE) 500 MG tablet 4 tablets daily at supper  . Multiple Vitamin (MULTIVITAMIN) tablet Take 1 tablet by mouth daily.  Marland Kitchen NEXIUM 40 MG capsule TAKE ONE CAPSULE BY MOUTH TWICE DAILY  . triamcinolone (NASACORT AQ) 55 MCG/ACT AERO nasal inhaler Place 2 sprays into the nose daily.  . vitamin E 400 UNIT capsule Take 400 Units by mouth daily.  Marland Kitchen doxycycline (VIBRA-TABS) 100 MG tablet Take 1 tablet (100 mg total) by mouth 2 (two) times daily. (Patient not taking: Reported on 05/10/2014)  . losartan (COZAAR) 50 MG tablet TAKE 1 TABLET BY MOUTH EVERY DAY (Patient not taking: Reported on 05/10/2014)    Review of Systems  Constitutional: Negative for  fever and appetite change.  Respiratory: Negative for chest tightness and shortness of breath.   Cardiovascular: Positive for leg swelling (left lower extremity swelling as outlined.  ). Negative for chest pain and palpitations.  Gastrointestinal: Negative for nausea, vomiting and abdominal pain.  Musculoskeletal:       Increased pain - back, knee and legs.        Objective:     Blood pressure recheck:  128/72  Physical Exam  Constitutional: He appears well-developed and well-nourished. No distress.  Cardiovascular: Normal rate and regular rhythm.   Pulmonary/Chest: Effort normal and breath sounds normal. No respiratory distress.  Abdominal: Soft. Bowel sounds are normal. There is no tenderness.  Musculoskeletal:  Left lower extremity edema.  DP pulses palpable and equal bilaterally.  Psoriasis - rash - lower extremities - left worse.  Increase tenderness to palpation over the left calf and left popliteal region.   Psychiatric: He has a normal mood and affect. His behavior is normal.    BP 122/72 mmHg  Pulse 78  Temp(Src) 98.8 F (37.1 C) (Oral)  Ht 5' 7.5" (1.715 m)  Wt 195 lb (88.451 kg)  BMI 30.07 kg/m2  SpO2 96% Wt Readings from Last 3 Encounters:  05/10/14 195 lb (88.451 kg)  03/08/14  200 lb 1.9 oz (90.774 kg)  01/28/14 201 lb (91.173 kg)     Lab Results  Component Value Date   WBC 5.9 01/26/2014   HGB 14.3 01/26/2014   HCT 42.5 01/26/2014   PLT 203.0 01/26/2014   GLUCOSE 121* 01/26/2014   CHOL 177 01/26/2014   TRIG 186.0* 01/26/2014   HDL 32.00* 01/26/2014   LDLCALC 108* 01/26/2014   ALT 33 01/26/2014   AST 25 01/26/2014   NA 137 01/26/2014   K 3.9 01/26/2014   CL 102 01/26/2014   CREATININE 0.91 01/26/2014   BUN 20 01/26/2014   CO2 25 01/26/2014   TSH 2.07 01/26/2014   PSA 1.41 01/26/2014   HGBA1C 6.3 01/26/2014       Assessment & Plan:   Problem List Items Addressed This Visit    Hypertension    Blood pressure is well controlled.  Same  medication regimen.        Left leg swelling - Primary    Increased swelling left leg.  Exam as outlined.  Will obtain lower extremity duplex to confirm no DVT.  Keep f/u with Dr Sharlet Salina - regarding the increased pain.        Relevant Orders   Lower Extremity Venous Duplex Left   Psoriatic arthritis    Sees Dr Jefm Bryant.  Increased back/leg pain.  Feels may be related to spinal stenosis.  Being treated for psoriatic arthritis.            Einar Pheasant, MD

## 2014-05-10 NOTE — Progress Notes (Signed)
Pre visit review using our clinic review tool, if applicable. No additional management support is needed unless otherwise documented below in the visit note. 

## 2014-05-11 ENCOUNTER — Encounter: Payer: Self-pay | Admitting: Internal Medicine

## 2014-05-28 ENCOUNTER — Other Ambulatory Visit: Payer: Self-pay | Admitting: Internal Medicine

## 2014-06-09 ENCOUNTER — Other Ambulatory Visit: Payer: Self-pay | Admitting: Internal Medicine

## 2014-06-23 ENCOUNTER — Other Ambulatory Visit (INDEPENDENT_AMBULATORY_CARE_PROVIDER_SITE_OTHER): Payer: BLUE CROSS/BLUE SHIELD

## 2014-06-23 DIAGNOSIS — E119 Type 2 diabetes mellitus without complications: Secondary | ICD-10-CM

## 2014-06-23 DIAGNOSIS — E7800 Pure hypercholesterolemia, unspecified: Secondary | ICD-10-CM

## 2014-06-23 DIAGNOSIS — R7989 Other specified abnormal findings of blood chemistry: Secondary | ICD-10-CM

## 2014-06-23 DIAGNOSIS — I10 Essential (primary) hypertension: Secondary | ICD-10-CM | POA: Diagnosis not present

## 2014-06-23 DIAGNOSIS — R945 Abnormal results of liver function studies: Secondary | ICD-10-CM

## 2014-06-23 LAB — HEPATIC FUNCTION PANEL
ALK PHOS: 45 U/L (ref 39–117)
ALT: 13 U/L (ref 0–53)
AST: 15 U/L (ref 0–37)
Albumin: 3.9 g/dL (ref 3.5–5.2)
BILIRUBIN DIRECT: 0.1 mg/dL (ref 0.0–0.3)
BILIRUBIN TOTAL: 0.4 mg/dL (ref 0.2–1.2)
Total Protein: 7.5 g/dL (ref 6.0–8.3)

## 2014-06-23 LAB — BASIC METABOLIC PANEL
BUN: 18 mg/dL (ref 6–23)
CALCIUM: 9.2 mg/dL (ref 8.4–10.5)
CO2: 28 mEq/L (ref 19–32)
Chloride: 103 mEq/L (ref 96–112)
Creatinine, Ser: 0.8 mg/dL (ref 0.40–1.50)
GFR: 102.88 mL/min (ref >60.00)
Glucose, Bld: 119 mg/dL — ABNORMAL HIGH (ref 70–99)
Potassium: 3.6 mEq/L (ref 3.5–5.1)
SODIUM: 137 meq/L (ref 135–145)

## 2014-06-23 LAB — MICROALBUMIN / CREATININE URINE RATIO
Creatinine,U: 202 mg/dL
MICROALB UR: 2 mg/dL — AB (ref 0.0–1.9)
Microalb Creat Ratio: 1 mg/g (ref 0.0–30.0)

## 2014-06-23 LAB — HEMOGLOBIN A1C: Hgb A1c MFr Bld: 6 % (ref 4.6–6.5)

## 2014-06-23 LAB — LIPID PANEL
CHOL/HDL RATIO: 5
Cholesterol: 163 mg/dL (ref 0–200)
HDL: 31.1 mg/dL — ABNORMAL LOW (ref >39.00)
LDL CALC: 105 mg/dL — AB (ref 0–99)
NONHDL: 131.9
Triglycerides: 133 mg/dL (ref 0.0–149.0)
VLDL: 26.6 mg/dL (ref 0.0–40.0)

## 2014-06-24 ENCOUNTER — Encounter: Payer: Self-pay | Admitting: Internal Medicine

## 2014-06-30 ENCOUNTER — Encounter: Payer: Self-pay | Admitting: Internal Medicine

## 2014-06-30 ENCOUNTER — Ambulatory Visit (INDEPENDENT_AMBULATORY_CARE_PROVIDER_SITE_OTHER): Payer: BLUE CROSS/BLUE SHIELD | Admitting: Internal Medicine

## 2014-06-30 VITALS — BP 120/70 | HR 72 | Temp 98.5°F | Ht 67.5 in | Wt 195.4 lb

## 2014-06-30 DIAGNOSIS — Z0000 Encounter for general adult medical examination without abnormal findings: Secondary | ICD-10-CM

## 2014-06-30 DIAGNOSIS — M545 Low back pain, unspecified: Secondary | ICD-10-CM

## 2014-06-30 DIAGNOSIS — Z860100 Personal history of colon polyps, unspecified: Secondary | ICD-10-CM

## 2014-06-30 DIAGNOSIS — E119 Type 2 diabetes mellitus without complications: Secondary | ICD-10-CM

## 2014-06-30 DIAGNOSIS — K219 Gastro-esophageal reflux disease without esophagitis: Secondary | ICD-10-CM

## 2014-06-30 DIAGNOSIS — N400 Enlarged prostate without lower urinary tract symptoms: Secondary | ICD-10-CM | POA: Diagnosis not present

## 2014-06-30 DIAGNOSIS — L4050 Arthropathic psoriasis, unspecified: Secondary | ICD-10-CM

## 2014-06-30 DIAGNOSIS — R7989 Other specified abnormal findings of blood chemistry: Secondary | ICD-10-CM

## 2014-06-30 DIAGNOSIS — H539 Unspecified visual disturbance: Secondary | ICD-10-CM

## 2014-06-30 DIAGNOSIS — R972 Elevated prostate specific antigen [PSA]: Secondary | ICD-10-CM

## 2014-06-30 DIAGNOSIS — R945 Abnormal results of liver function studies: Secondary | ICD-10-CM

## 2014-06-30 DIAGNOSIS — I10 Essential (primary) hypertension: Secondary | ICD-10-CM | POA: Diagnosis not present

## 2014-06-30 NOTE — Progress Notes (Signed)
Patient ID: Shane Mcknight, male   DOB: 02-06-1950, 64 y.o.   MRN: [id number]   Subjective:    Patient ID: Shane Mcknight, male    DOB: 04-Apr-1950, 64 y.o.   MRN: [id number]  HPI  Patient here to follow on his current medical issues as well as for a physical exam.  Has been seeing Dr Sharlet Salina.  S/p injections for his back.  See Dr Sharlet Salina notes for details.  Doing some better.  Seeing Dr Jefm Bryant for his psoriatic arthritis.  See his note for details.  Started on clobetasol for his rash.  On humira.  Has bilateral hand swelling and stiffness.  No cardiac symptoms with increased activity or exertion.  Breathing stable.  Eating and drinking well.  Trying to watch his diet.  Sugar and triglycerides have improved on recent lab check.  Bowels stable.  No urinary problems reported.     Past Medical History  Diagnosis Date  . Hypertension   . Diabetes mellitus     type 2  . Abnormal liver function tests   . Psoriasis   . Hiatal hernia   . Psoriatic arthritis   . Sleep apnea     Current Outpatient Prescriptions on File Prior to Visit  Medication Sig Dispense Refill  . Adalimumab (HUMIRA PEN [place]) Inject into the skin every 7 (seven) days.     Marland Kitchen aspirin 81 MG tablet Take 81 mg by mouth daily.    Marland Kitchen atenolol (TENORMIN) 50 MG tablet TAKE 1 TABLET BY MOUTH TWICE DAILY 60 tablet 5  . fish oil-omega-3 fatty acids 1000 MG capsule Take 2 g by mouth daily.    . folic acid (FOLVITE) 1 MG tablet Take 1 mg by mouth daily.    Marland Kitchen gabapentin (NEURONTIN) 300 MG capsule Take 300 mg by mouth 3 (three) times daily as needed.    Marland Kitchen losartan (COZAAR) 50 MG tablet TAKE 1 TABLET BY MOUTH EVERY DAY 90 tablet 0  . metFORMIN (GLUCOPHAGE) 500 MG tablet TAKE 4 TABLETS BY MOUTH EVERY EVENING 120 tablet 5  . Multiple Vitamin (MULTIVITAMIN) tablet Take 1 tablet by mouth daily.    Marland Kitchen triamcinolone (NASACORT AQ) 55 MCG/ACT AERO nasal inhaler Place 2 sprays into the nose daily. 1 Inhaler 3  . vitamin E 400 UNIT capsule Take 400 Units by  mouth daily.     No current facility-administered medications on file prior to visit.    Review of Systems  Constitutional: Negative for appetite change and unexpected weight change.  HENT: Negative for congestion and sinus pressure.   Eyes: Negative for pain and visual disturbance.  Respiratory: Negative for cough, chest tightness and shortness of breath.   Cardiovascular: Negative for chest pain, palpitations and leg swelling.  Gastrointestinal: Negative for nausea, vomiting, abdominal pain and diarrhea.  Genitourinary: Negative for dysuria and difficulty urinating.  Musculoskeletal: Positive for back pain (seeing Dr Sharlet Salina.  see his notes for details.  s/p injection. ) and joint swelling (followed by Dr Jefm Bryant. ).  Skin: Positive for rash (stable.  has psoriasis.  ). Negative for color change.  Neurological: Negative for dizziness, light-headedness and headaches.  Hematological: Negative for adenopathy. Does not bruise/bleed easily.  Psychiatric/Behavioral: Negative for dysphoric mood and agitation.       Objective:     Blood pressure recheck:  120/72, pulse 76  Physical Exam  Constitutional: He is oriented to person, place, and time. He appears well-developed and well-nourished. No distress.  HENT:  Head: Normocephalic and atraumatic.  Nose: Nose normal.  Mouth/Throat: Oropharynx is clear and moist. No oropharyngeal exudate.  Eyes: Conjunctivae are normal. Right eye exhibits no discharge. Left eye exhibits no discharge.  Neck: Neck supple. No thyromegaly present.  Cardiovascular: Normal rate and regular rhythm.   Pulmonary/Chest: Breath sounds normal. No respiratory distress. He has no wheezes.  Abdominal: Soft. Bowel sounds are normal. There is no tenderness.  Genitourinary:  Prostate exam reveals what appears to be increased right lobe of prostate.  No palpable nodule.    Musculoskeletal:  Hand swelling and stiffness/tenderness.  No lower extremity swelling.     Lymphadenopathy:    He has no cervical adenopathy.  Neurological: He is alert and oriented to person, place, and time.  Skin: Skin is warm and dry. Rash noted.  Psychiatric: He has a normal mood and affect. His behavior is normal.    BP 120/70 mmHg  Pulse 72  Temp(Src) 98.5 F (36.9 C) (Oral)  Ht 5' 7.5" (1.715 m)  Wt 195 lb 6 oz (88.622 kg)  BMI 30.13 kg/m2  SpO2 96% Wt Readings from Last 3 Encounters:  06/30/14 195 lb 6 oz (88.622 kg)  05/10/14 195 lb (88.451 kg)  03/08/14 200 lb 1.9 oz (90.774 kg)     Lab Results  Component Value Date   WBC 5.9 01/26/2014   HGB 14.3 01/26/2014   HCT 42.5 01/26/2014   PLT 203.0 01/26/2014   GLUCOSE 119* 06/23/2014   CHOL 163 06/23/2014   TRIG 133.0 06/23/2014   HDL 31.10* 06/23/2014   LDLCALC 105* 06/23/2014   ALT 13 06/23/2014   AST 15 06/23/2014   NA 137 06/23/2014   K 3.6 06/23/2014   CL 103 06/23/2014   CREATININE 0.80 06/23/2014   BUN 18 06/23/2014   CO2 28 06/23/2014   TSH 2.07 01/26/2014   PSA 1.59 06/30/2014   HGBA1C 6.0 06/23/2014   MICROALBUR 2.0* 06/23/2014    US Venous Img Lower Unilateral Left  05/10/2014   CLINICAL DATA:  64 year old male with left lower extremity pain and swelling  EXAM: LEFT LOWER EXTREMITY VENOUS DOPPLER ULTRASOUND  TECHNIQUE: Gray-scale sonography with graded compression, as well as color Doppler and duplex ultrasound were performed to evaluate the lower extremity deep venous systems from the level of the common femoral vein and including the common femoral, femoral, profunda femoral, popliteal and calf veins including the posterior tibial, peroneal and gastrocnemius veins when visible. The superficial great saphenous vein was also interrogated. Spectral Doppler was utilized to evaluate flow at rest and with distal augmentation maneuvers in the common femoral, femoral and popliteal veins.  COMPARISON:  None.  FINDINGS: Contralateral Common Femoral Vein: Respiratory phasicity is normal and  symmetric with the symptomatic side. No evidence of thrombus. Normal compressibility.  Common Femoral Vein: No evidence of thrombus. Normal compressibility, respiratory phasicity and response to augmentation.  Saphenofemoral Junction: No evidence of thrombus. Normal compressibility and flow on color Doppler imaging.  Profunda Femoral Vein: No evidence of thrombus. Normal compressibility and flow on color Doppler imaging.  Femoral Vein: No evidence of thrombus. Normal compressibility, respiratory phasicity and response to augmentation.  Popliteal Vein: No evidence of thrombus. Normal compressibility, respiratory phasicity and response to augmentation.  Calf Veins: No evidence of thrombus. Normal compressibility and flow on color Doppler imaging.  Superficial Great Saphenous Vein: No evidence of thrombus. Normal compressibility and flow on color Doppler imaging.  Venous Reflux:  None.  Other Findings: Mildly complex fluid collection in the popliteal fossa measures 4.6 x 0.7  x 3.9 cm and is consistent with a Baker's cyst.  IMPRESSION: No evidence of deep venous thrombosis.  Baker's cyst in the popliteal fossa.   Electronically Signed   By: Jacqulynn Cadet M.D.   On: 05/10/2014 19:21       Assessment & Plan:   Problem List Items Addressed This Visit    Abnormal liver function tests    Recent liver function tests wnl.        Relevant Orders   Hepatic function panel   Back pain    Seeing Dr Sharlet Salina.   S/p injection.        Change in vision    Previously worked up and felt to be migraine variant.  Has not occurred recently.        Diabetes mellitus    Was being followed by Dr Gabriel Carina.  Low carb diet and exercise.  Follow met b and a1c.   Lab Results  Component Value Date   HGBA1C 6.0 06/23/2014        Relevant Orders   Lipid panel   Hemoglobin A1c   Elevated PSA measurement    Recheck psa today.  Exam as outlined.  Discussed referral to urology given enlarged right prostate lobe.  He  declines further w/up or evaluation.  Follow.        GERD (gastroesophageal reflux disease)    Controlled.  Same medication regimen.        Health care maintenance    Physical today 06/30/14.  Check psa today.  Colonoscopy as outlined.        History of colonic polyps    Colonoscopy 08/30/13 as outlined.  Recommended f/u colonoscopy in three years.        Hypertension    Blood pressure doing well.  Same medication regimen.  Follow pressures.  Follow metabolic panel.        Relevant Orders   Basic metabolic panel   Psoriatic arthritis    Followed by Dr Jefm Bryant.  On humira.         Other Visit Diagnoses    Enlarged prostate    -  Primary    Relevant Orders    PSA (Completed)      I spent 25 minutes with the patient and more than 50% of the time was spent in consultation regarding the above.     Einar Pheasant, MD

## 2014-06-30 NOTE — Progress Notes (Signed)
Pre visit review using our clinic review tool, if applicable. No additional management support is needed unless otherwise documented below in the visit note. 

## 2014-07-01 LAB — PSA: PSA: 1.59 ng/mL (ref 0.10–4.00)

## 2014-07-04 ENCOUNTER — Encounter: Payer: Self-pay | Admitting: Internal Medicine

## 2014-07-08 ENCOUNTER — Other Ambulatory Visit: Payer: Self-pay | Admitting: Internal Medicine

## 2014-07-10 ENCOUNTER — Encounter: Payer: Self-pay | Admitting: Internal Medicine

## 2014-07-10 DIAGNOSIS — Z0000 Encounter for general adult medical examination without abnormal findings: Secondary | ICD-10-CM | POA: Insufficient documentation

## 2014-07-10 NOTE — Assessment & Plan Note (Signed)
Physical today 06/30/14.  Check psa today.  Colonoscopy as outlined.

## 2014-07-10 NOTE — Assessment & Plan Note (Signed)
Colonoscopy 08/30/13 as outlined.  Recommended f/u colonoscopy in three years.

## 2014-07-10 NOTE — Assessment & Plan Note (Signed)
Previously worked up and felt to be migraine variant.  Has not occurred recently.

## 2014-07-10 NOTE — Assessment & Plan Note (Signed)
Seeing Dr Chasnis.  S/p injection.   

## 2014-07-10 NOTE — Assessment & Plan Note (Signed)
Recheck psa today.  Exam as outlined.  Discussed referral to urology given enlarged right prostate lobe.  He declines further w/up or evaluation.  Follow.

## 2014-07-10 NOTE — Assessment & Plan Note (Signed)
Followed by Dr Jefm Bryant.  On humira.

## 2014-07-10 NOTE — Assessment & Plan Note (Signed)
Blood pressure doing well.  Same medication regimen.  Follow pressures.  Follow metabolic panel.   

## 2014-07-10 NOTE — Assessment & Plan Note (Signed)
Recent liver function tests wnl.  

## 2014-07-10 NOTE — Assessment & Plan Note (Signed)
Controlled.  Same medication regimen.

## 2014-07-10 NOTE — Assessment & Plan Note (Signed)
Was being followed by Dr Gabriel Carina.  Low carb diet and exercise.  Follow met b and a1c.   Lab Results  Component Value Date   HGBA1C 6.0 06/23/2014

## 2014-07-11 NOTE — Telephone Encounter (Signed)
Unread mychart message mailed to patient 

## 2014-07-21 ENCOUNTER — Other Ambulatory Visit: Payer: Self-pay | Admitting: Internal Medicine

## 2014-10-11 ENCOUNTER — Other Ambulatory Visit: Payer: Self-pay | Admitting: Internal Medicine

## 2014-10-22 ENCOUNTER — Other Ambulatory Visit: Payer: Self-pay | Admitting: Internal Medicine

## 2014-11-02 ENCOUNTER — Encounter: Payer: Self-pay | Admitting: Internal Medicine

## 2014-11-02 ENCOUNTER — Other Ambulatory Visit (INDEPENDENT_AMBULATORY_CARE_PROVIDER_SITE_OTHER): Payer: BLUE CROSS/BLUE SHIELD

## 2014-11-02 DIAGNOSIS — R945 Abnormal results of liver function studies: Secondary | ICD-10-CM

## 2014-11-02 DIAGNOSIS — I10 Essential (primary) hypertension: Secondary | ICD-10-CM | POA: Diagnosis not present

## 2014-11-02 DIAGNOSIS — R7989 Other specified abnormal findings of blood chemistry: Secondary | ICD-10-CM

## 2014-11-02 DIAGNOSIS — E119 Type 2 diabetes mellitus without complications: Secondary | ICD-10-CM

## 2014-11-02 LAB — LIPID PANEL
CHOL/HDL RATIO: 6
Cholesterol: 177 mg/dL (ref 0–200)
HDL: 28 mg/dL — AB (ref >39.00)
LDL Cholesterol: 126 mg/dL — ABNORMAL HIGH (ref 0–99)
NONHDL: 148.62
Triglycerides: 111 mg/dL (ref 0.0–149.0)
VLDL: 22.2 mg/dL (ref 0.0–40.0)

## 2014-11-02 LAB — BASIC METABOLIC PANEL
BUN: 25 mg/dL — ABNORMAL HIGH (ref 6–23)
CHLORIDE: 103 meq/L (ref 96–112)
CO2: 25 meq/L (ref 19–32)
Calcium: 9.6 mg/dL (ref 8.4–10.5)
Creatinine, Ser: 0.9 mg/dL (ref 0.40–1.50)
GFR: 89.71 mL/min (ref >60.00)
Glucose, Bld: 120 mg/dL — ABNORMAL HIGH (ref 70–99)
Potassium: 3.8 mEq/L (ref 3.5–5.1)
SODIUM: 139 meq/L (ref 135–145)

## 2014-11-02 LAB — HEPATIC FUNCTION PANEL
ALBUMIN: 4 g/dL (ref 3.5–5.2)
ALT: 30 U/L (ref 0–53)
AST: 27 U/L (ref 0–37)
Alkaline Phosphatase: 43 U/L (ref 39–117)
BILIRUBIN TOTAL: 0.6 mg/dL (ref 0.2–1.2)
Bilirubin, Direct: 0.2 mg/dL (ref 0.0–0.3)
Total Protein: 8 g/dL (ref 6.0–8.3)

## 2014-11-02 LAB — HEMOGLOBIN A1C: HEMOGLOBIN A1C: 6.2 % (ref 4.6–6.5)

## 2014-11-04 ENCOUNTER — Ambulatory Visit (INDEPENDENT_AMBULATORY_CARE_PROVIDER_SITE_OTHER): Payer: BLUE CROSS/BLUE SHIELD | Admitting: Internal Medicine

## 2014-11-04 ENCOUNTER — Encounter: Payer: Self-pay | Admitting: Internal Medicine

## 2014-11-04 VITALS — BP 100/60 | HR 66 | Temp 98.4°F | Resp 17 | Ht 67.5 in | Wt 194.0 lb

## 2014-11-04 DIAGNOSIS — R945 Abnormal results of liver function studies: Secondary | ICD-10-CM

## 2014-11-04 DIAGNOSIS — L4050 Arthropathic psoriasis, unspecified: Secondary | ICD-10-CM | POA: Diagnosis not present

## 2014-11-04 DIAGNOSIS — K219 Gastro-esophageal reflux disease without esophagitis: Secondary | ICD-10-CM

## 2014-11-04 DIAGNOSIS — Z860100 Personal history of colon polyps, unspecified: Secondary | ICD-10-CM

## 2014-11-04 DIAGNOSIS — I10 Essential (primary) hypertension: Secondary | ICD-10-CM

## 2014-11-04 DIAGNOSIS — Z125 Encounter for screening for malignant neoplasm of prostate: Secondary | ICD-10-CM

## 2014-11-04 DIAGNOSIS — E119 Type 2 diabetes mellitus without complications: Secondary | ICD-10-CM | POA: Diagnosis not present

## 2014-11-04 DIAGNOSIS — R972 Elevated prostate specific antigen [PSA]: Secondary | ICD-10-CM

## 2014-11-04 DIAGNOSIS — R7989 Other specified abnormal findings of blood chemistry: Secondary | ICD-10-CM

## 2014-11-04 NOTE — Progress Notes (Signed)
Pre-visit discussion using our clinic review tool. No additional management support is needed unless otherwise documented below in the visit note.  

## 2014-11-04 NOTE — Progress Notes (Signed)
Patient ID: Shane Mcknight, male   DOB: August 01, 1950, 64 y.o.   MRN: [id number]   Subjective:    Patient ID: Shane Mcknight, male    DOB: 1950-09-28, 64 y.o.   MRN: [id number]  HPI  Patient with past history of hypertension, abnormal liver function tests, psoriasis and psoriatic arthritis.  He comes in today to follow up on these issues as well as for a complete physical exam.  He is followed by Dr Jefm Bryant.  He just started a new medication for his arthritis - cosentyx.  Due to have his second injection Saturday.  Joints are relatively stable.  Skin still flared, but is better.  Tries to stay active.  Cholesterol went up some.  a1c 6.2.  No chest pain or tightness.  No sob.  Eating and drinking well.  Bowels stable.  Some occasional cramping in her lower extremities.  Intermittent groin pain.  None today.  Will just flare intermittently.  Better then previous.  Intermittent loose stool, but better since off humira.  He feels this is related to medication.  No nausea or vomiting.     Past Medical History  Diagnosis Date  . Hypertension   . Diabetes mellitus ([place])     type 2  . Abnormal liver function tests   . Psoriasis   . Hiatal hernia   . Psoriatic arthritis ([place])   . Sleep apnea    Past Surgical History  Procedure Laterality Date  . Cholecystectomy    . Rotator cuff repair    . Arthroplasty      right thumb  . Carpal tunnel release      right  . Lumbar disc surgery     Family History  Problem Relation Age of Onset  . Lung cancer Father   . Hypertension Mother    Social History   Social History  . Marital Status: Married    Spouse Name: N/A  . Number of Children: N/A  . Years of Education: N/A   Social History Main Topics  . Smoking status: Former Smoker    Quit date: 07/06/1982  . Smokeless tobacco: Never Used  . Alcohol Use: No  . Drug Use: No  . Sexual Activity: Not Asked   Other Topics Concern  . None   Social History Narrative    Outpatient Encounter  Prescriptions as of 11/04/2014  Medication Sig  . aspirin 81 MG tablet Take 81 mg by mouth daily.  Marland Kitchen atenolol (TENORMIN) 50 MG tablet TAKE 1 TABLET BY MOUTH TWICE DAILY  . esomeprazole (NEXIUM) 40 MG capsule TAKE 1 CAPSULE BY MOUTH TWICE DAILY  . etodolac (LODINE) 500 MG tablet   . fish oil-omega-3 fatty acids 1000 MG capsule Take 2 g by mouth daily.  . folic acid (FOLVITE) 1 MG tablet Take 1 mg by mouth daily.  Marland Kitchen gabapentin (NEURONTIN) 300 MG capsule Take 300 mg by mouth 3 (three) times daily as needed.  . hydrochlorothiazide (HYDRODIURIL) 25 MG tablet TAKE 1 TABLET BY MOUTH EVERY DAY  . losartan (COZAAR) 50 MG tablet TAKE 1 TABLET BY MOUTH EVERY DAY  . metFORMIN (GLUCOPHAGE) 500 MG tablet TAKE 4 TABLETS BY MOUTH EVERY EVENING  . Multiple Vitamin (MULTIVITAMIN) tablet Take 1 tablet by mouth daily.  Marland Kitchen triamcinolone (NASACORT AQ) 55 MCG/ACT AERO nasal inhaler Place 2 sprays into the nose daily.  . vitamin E 400 UNIT capsule Take 400 Units by mouth daily.  . [DISCONTINUED] Adalimumab (HUMIRA PEN [place]) Inject into the skin every  7 (seven) days.    No facility-administered encounter medications on file as of 11/04/2014.    Review of Systems  Constitutional: Negative for appetite change and unexpected weight change.  HENT: Negative for congestion and sinus pressure.   Eyes: Negative for discharge and visual disturbance.  Respiratory: Negative for cough, chest tightness and shortness of breath.   Cardiovascular: Negative for chest pain, palpitations and leg swelling.  Gastrointestinal: Negative for nausea, vomiting and abdominal pain.       Some occasional loose stool.   Genitourinary: Negative for dysuria and difficulty urinating.  Musculoskeletal: Positive for joint swelling (and pain.  on a new medication. ).  Skin: Positive for rash.       Psoriasis.   Neurological: Negative for dizziness, light-headedness and headaches.  Psychiatric/Behavioral: Negative for dysphoric mood and agitation.        Objective:     Blood pressure rechecked by me:  128/68  Physical Exam  Constitutional: He appears well-developed and well-nourished. No distress.  HENT:  Nose: Nose normal.  Mouth/Throat: Oropharynx is clear and moist.  Eyes: Conjunctivae are normal. Right eye exhibits no discharge. Left eye exhibits no discharge.  Neck: Neck supple. No thyromegaly present.  Cardiovascular: Normal rate and regular rhythm.   Pulmonary/Chest: Effort normal and breath sounds normal. No respiratory distress.  Abdominal: Soft. Bowel sounds are normal. There is no tenderness.  Musculoskeletal: He exhibits no edema or tenderness.  No pain with straight leg raise.  No pain with rotation of lower extremity and with abduction and adduction of lower extremities.    Lymphadenopathy:    He has no cervical adenopathy.  Skin:  Psoriasis (rash) present.    Psychiatric: He has a normal mood and affect. His behavior is normal.    BP 100/60 mmHg  Pulse 66  Temp(Src) 98.4 F (36.9 C) (Oral)  Resp 17  Ht 5' 7.5" (1.715 m)  Wt 194 lb (87.998 kg)  BMI 29.92 kg/m2  SpO2 96% Wt Readings from Last 3 Encounters:  11/04/14 194 lb (87.998 kg)  06/30/14 195 lb 6 oz (88.622 kg)  05/10/14 195 lb (88.451 kg)     Lab Results  Component Value Date   WBC 5.9 01/26/2014   HGB 14.3 01/26/2014   HCT 42.5 01/26/2014   PLT 203.0 01/26/2014   GLUCOSE 120* 11/02/2014   CHOL 177 11/02/2014   TRIG 111.0 11/02/2014   HDL 28.00* 11/02/2014   LDLCALC 126* 11/02/2014   ALT 30 11/02/2014   AST 27 11/02/2014   NA 139 11/02/2014   K 3.8 11/02/2014   CL 103 11/02/2014   CREATININE 0.90 11/02/2014   BUN 25* 11/02/2014   CO2 25 11/02/2014   TSH 2.07 01/26/2014   PSA 1.59 06/30/2014   HGBA1C 6.2 11/02/2014   MICROALBUR 2.0* 06/23/2014       Assessment & Plan:   Problem List Items Addressed This Visit    Abnormal liver function tests    Last liver tests wnl.  Follow liver function tests.        Relevant  Orders   Hepatic function panel   Diabetes mellitus ([place])    Was being followed by Dr Gabriel Carina.  Low carb diet and exercise.  Follow met b and a1c.   Lab Results  Component Value Date   HGBA1C 6.2 11/02/2014        Relevant Orders   Lipid panel   Hemoglobin A1c   Elevated PSA measurement    PSA 06/30/14 - 1.59.  GERD (gastroesophageal reflux disease)    Controlled on nexium.  Follow.        History of colonic polyps    colonoscopy 08/30/13 - as outlined in overview.  Recommended f/u colonoscopy in three years.        Hypertension - Primary    Blood pressure under good control.  Continue same medication regimen.  Follow pressures.  Follow metabolic panel.        Relevant Orders   CBC with Differential/Platelet   Basic metabolic panel   Psoriatic arthritis ([place])    Followed by Dr Jefm Bryant.  On cosentyx now.  Some improvement.        Relevant Orders   TSH   Hepatic function panel    Other Visit Diagnoses    Prostate cancer screening        Relevant Orders    PSA, Medicare        Einar Pheasant, MD

## 2014-11-06 ENCOUNTER — Encounter: Payer: Self-pay | Admitting: Internal Medicine

## 2014-11-06 NOTE — Assessment & Plan Note (Signed)
colonoscopy 08/30/13 - as outlined in overview.  Recommended f/u colonoscopy in three years.

## 2014-11-06 NOTE — Assessment & Plan Note (Signed)
PSA 06/30/14 - 1.59.

## 2014-11-06 NOTE — Assessment & Plan Note (Signed)
Was being followed by Dr Gabriel Carina.  Low carb diet and exercise.  Follow met b and a1c.   Lab Results  Component Value Date   HGBA1C 6.2 11/02/2014

## 2014-11-06 NOTE — Assessment & Plan Note (Signed)
Blood pressure under good control.  Continue same medication regimen.  Follow pressures.  Follow metabolic panel.   

## 2014-11-06 NOTE — Assessment & Plan Note (Signed)
Followed by Dr Jefm Bryant.  On cosentyx now.  Some improvement.

## 2014-11-06 NOTE — Assessment & Plan Note (Signed)
Controlled on nexium.  Follow.  

## 2014-11-06 NOTE — Assessment & Plan Note (Signed)
Last liver tests wnl.  Follow liver function tests.

## 2014-11-11 LAB — HM DIABETES EYE EXAM

## 2014-11-17 ENCOUNTER — Encounter: Payer: Self-pay | Admitting: Internal Medicine

## 2014-12-04 ENCOUNTER — Other Ambulatory Visit: Payer: Self-pay | Admitting: Internal Medicine

## 2015-01-02 ENCOUNTER — Other Ambulatory Visit: Payer: Self-pay | Admitting: Internal Medicine

## 2015-01-16 ENCOUNTER — Other Ambulatory Visit: Payer: Self-pay | Admitting: Internal Medicine

## 2015-01-18 ENCOUNTER — Other Ambulatory Visit: Payer: Self-pay | Admitting: Internal Medicine

## 2015-03-05 ENCOUNTER — Other Ambulatory Visit: Payer: Self-pay | Admitting: Internal Medicine

## 2015-03-08 ENCOUNTER — Other Ambulatory Visit: Payer: BLUE CROSS/BLUE SHIELD

## 2015-03-10 ENCOUNTER — Ambulatory Visit: Payer: BLUE CROSS/BLUE SHIELD | Admitting: Internal Medicine

## 2015-03-15 ENCOUNTER — Other Ambulatory Visit: Payer: Self-pay | Admitting: Internal Medicine

## 2015-04-03 ENCOUNTER — Other Ambulatory Visit: Payer: Self-pay | Admitting: Internal Medicine

## 2015-04-07 ENCOUNTER — Other Ambulatory Visit (INDEPENDENT_AMBULATORY_CARE_PROVIDER_SITE_OTHER): Payer: BLUE CROSS/BLUE SHIELD

## 2015-04-07 DIAGNOSIS — I10 Essential (primary) hypertension: Secondary | ICD-10-CM | POA: Diagnosis not present

## 2015-04-07 DIAGNOSIS — E119 Type 2 diabetes mellitus without complications: Secondary | ICD-10-CM | POA: Diagnosis not present

## 2015-04-07 DIAGNOSIS — L4050 Arthropathic psoriasis, unspecified: Secondary | ICD-10-CM | POA: Diagnosis not present

## 2015-04-07 DIAGNOSIS — Z125 Encounter for screening for malignant neoplasm of prostate: Secondary | ICD-10-CM | POA: Diagnosis not present

## 2015-04-07 DIAGNOSIS — R7989 Other specified abnormal findings of blood chemistry: Secondary | ICD-10-CM

## 2015-04-07 DIAGNOSIS — R945 Abnormal results of liver function studies: Secondary | ICD-10-CM

## 2015-04-07 LAB — TSH: TSH: 1.93 u[IU]/mL (ref 0.35–4.50)

## 2015-04-07 LAB — CBC WITH DIFFERENTIAL/PLATELET
BASOS PCT: 0.4 % (ref 0.0–3.0)
Basophils Absolute: 0 10*3/uL (ref 0.0–0.1)
EOS PCT: 5.3 % — AB (ref 0.0–5.0)
Eosinophils Absolute: 0.4 10*3/uL (ref 0.0–0.7)
HCT: 39.8 % (ref 39.0–52.0)
HEMOGLOBIN: 13.6 g/dL (ref 13.0–17.0)
LYMPHS ABS: 1.7 10*3/uL (ref 0.7–4.0)
Lymphocytes Relative: 24.4 % (ref 12.0–46.0)
MCHC: 34.1 g/dL (ref 30.0–36.0)
MCV: 86.6 fl (ref 78.0–100.0)
MONO ABS: 0.8 10*3/uL (ref 0.1–1.0)
MONOS PCT: 10.9 % (ref 3.0–12.0)
Neutro Abs: 4.2 10*3/uL (ref 1.4–7.7)
Neutrophils Relative %: 59 % (ref 43.0–77.0)
Platelets: 198 10*3/uL (ref 150.0–400.0)
RBC: 4.6 Mil/uL (ref 4.22–5.81)
RDW: 14.8 % (ref 11.5–15.5)
WBC: 7.1 10*3/uL (ref 4.0–10.5)

## 2015-04-07 LAB — BASIC METABOLIC PANEL
BUN: 13 mg/dL (ref 6–23)
CALCIUM: 9.7 mg/dL (ref 8.4–10.5)
CO2: 29 mEq/L (ref 19–32)
Chloride: 100 mEq/L (ref 96–112)
Creatinine, Ser: 0.86 mg/dL (ref 0.40–1.50)
GFR: 94.42 mL/min (ref >60.00)
GLUCOSE: 107 mg/dL — AB (ref 70–99)
Potassium: 3.7 mEq/L (ref 3.5–5.1)
SODIUM: 140 meq/L (ref 135–145)

## 2015-04-07 LAB — LIPID PANEL
CHOL/HDL RATIO: 5
Cholesterol: 185 mg/dL (ref 0–200)
HDL: 34.8 mg/dL — AB (ref >39.00)
LDL Cholesterol: 119 mg/dL — ABNORMAL HIGH (ref 0–99)
NONHDL: 149.99
Triglycerides: 153 mg/dL — ABNORMAL HIGH (ref 0.0–149.0)
VLDL: 30.6 mg/dL (ref 0.0–40.0)

## 2015-04-07 LAB — HEPATIC FUNCTION PANEL
ALT: 20 U/L (ref 0–53)
AST: 16 U/L (ref 0–37)
Albumin: 4.4 g/dL (ref 3.5–5.2)
Alkaline Phosphatase: 38 U/L — ABNORMAL LOW (ref 39–117)
Bilirubin, Direct: 0.1 mg/dL (ref 0.0–0.3)
TOTAL PROTEIN: 7.6 g/dL (ref 6.0–8.3)
Total Bilirubin: 0.7 mg/dL (ref 0.2–1.2)

## 2015-04-07 LAB — PSA, MEDICARE: PSA: 1.5 ng/ml (ref 0.10–4.00)

## 2015-04-07 LAB — HEMOGLOBIN A1C: HEMOGLOBIN A1C: 6 % (ref 4.6–6.5)

## 2015-04-14 ENCOUNTER — Ambulatory Visit (INDEPENDENT_AMBULATORY_CARE_PROVIDER_SITE_OTHER): Payer: BLUE CROSS/BLUE SHIELD | Admitting: Internal Medicine

## 2015-04-14 ENCOUNTER — Encounter: Payer: Self-pay | Admitting: Internal Medicine

## 2015-04-14 VITALS — BP 122/70 | HR 86 | Temp 98.6°F | Resp 18 | Ht 67.5 in | Wt 196.2 lb

## 2015-04-14 DIAGNOSIS — R1030 Lower abdominal pain, unspecified: Secondary | ICD-10-CM

## 2015-04-14 DIAGNOSIS — Z86010 Personal history of colonic polyps: Secondary | ICD-10-CM

## 2015-04-14 DIAGNOSIS — L4050 Arthropathic psoriasis, unspecified: Secondary | ICD-10-CM | POA: Diagnosis not present

## 2015-04-14 DIAGNOSIS — I10 Essential (primary) hypertension: Secondary | ICD-10-CM | POA: Diagnosis not present

## 2015-04-14 DIAGNOSIS — R972 Elevated prostate specific antigen [PSA]: Secondary | ICD-10-CM

## 2015-04-14 DIAGNOSIS — E119 Type 2 diabetes mellitus without complications: Secondary | ICD-10-CM

## 2015-04-14 DIAGNOSIS — R1031 Right lower quadrant pain: Secondary | ICD-10-CM

## 2015-04-14 DIAGNOSIS — R7989 Other specified abnormal findings of blood chemistry: Secondary | ICD-10-CM

## 2015-04-14 DIAGNOSIS — K219 Gastro-esophageal reflux disease without esophagitis: Secondary | ICD-10-CM

## 2015-04-14 DIAGNOSIS — H109 Unspecified conjunctivitis: Secondary | ICD-10-CM

## 2015-04-14 DIAGNOSIS — R945 Abnormal results of liver function studies: Secondary | ICD-10-CM

## 2015-04-14 HISTORY — PX: CYST EXCISION: SHX5701

## 2015-04-14 MED ORDER — CIPROFLOXACIN HCL 0.3 % OP SOLN: 1.0000 [drp] | Freq: Four times a day (QID) | OPHTHALMIC | Status: DC

## 2015-04-14 NOTE — Progress Notes (Signed)
Patient ID: Shane Mcknight, male   DOB: 05-21-1950, 65 y.o.   MRN: [id number]   Subjective:    Patient ID: Shane Mcknight, male    DOB: 1950-03-01, 65 y.o.   MRN: [id number]  HPI  Patient here for a scheduled follow up.  He is followed by Dr Shane Mcknight for his psoriatic arthritis.  Doing well on Cosentyx.  Skin is better.  Discussed exercise.  He is walking some.  Tries to stay active.  Still with some right groin discomfort.  Better now.  Flares intermittently.  He was questioning if could be his hip.  Walking does not always cause a flare.  Some intermittent loose stool.  Overall bowels stable.  States blood pressures are averaging 120s/60s.  Is having some crusting and drainage in his right eye.  Persistent.  No significant sinus pressure or congestion.     Past Medical History  Diagnosis Date  . Hypertension   . Diabetes mellitus ([place])     type 2  . Abnormal liver function tests   . Psoriasis   . Hiatal hernia   . Psoriatic arthritis ([place])   . Sleep apnea    Past Surgical History  Procedure Laterality Date  . Cholecystectomy    . Rotator cuff repair    . Arthroplasty      right thumb  . Carpal tunnel release      right  . Lumbar disc surgery     Family History  Problem Relation Age of Onset  . Lung cancer Father   . Hypertension Mother    Social History   Social History  . Marital Status: Married    Spouse Name: N/A  . Number of Children: N/A  . Years of Education: N/A   Social History Main Topics  . Smoking status: Former Smoker    Quit date: 07/06/1982  . Smokeless tobacco: Never Used  . Alcohol Use: No  . Drug Use: No  . Sexual Activity: Not Asked   Other Topics Concern  . None   Social History Narrative    Outpatient Encounter Prescriptions as of 04/14/2015  Medication Sig  . aspirin 81 MG tablet Take 81 mg by mouth daily.  Marland Kitchen atenolol (TENORMIN) 50 MG tablet TAKE 1 TABLET BY MOUTH TWICE DAILY  . COSENTYX SENSOREADY PEN 150 MG/ML SOAJ   . esomeprazole  (NEXIUM) 40 MG capsule TAKE 1 CAPSULE BY MOUTH TWICE DAILY  . fish oil-omega-3 fatty acids 1000 MG capsule Take 2 g by mouth daily.  . folic acid (FOLVITE) 1 MG tablet Take 1 mg by mouth daily.  Marland Kitchen gabapentin (NEURONTIN) 300 MG capsule Take 300 mg by mouth 3 (three) times daily as needed.  . hydrochlorothiazide (HYDRODIURIL) 25 MG tablet TAKE 1 TABLET BY MOUTH EVERY DAY  . losartan (COZAAR) 50 MG tablet TAKE 1 TABLET BY MOUTH EVERY DAY  . metFORMIN (GLUCOPHAGE) 500 MG tablet TAKE 4 TABLETS BY MOUTH EVERY EVENING.  . Multiple Vitamin (MULTIVITAMIN) tablet Take 1 tablet by mouth daily.  Marland Kitchen triamcinolone (NASACORT AQ) 55 MCG/ACT AERO nasal inhaler Place 2 sprays into the nose daily.  . vitamin E 400 UNIT capsule Take 400 Units by mouth daily.  . ciprofloxacin (CILOXAN) 0.3 % ophthalmic solution Place 1 drop into the right eye every 6 (six) hours.  . [DISCONTINUED] etodolac (LODINE) 500 MG tablet    No facility-administered encounter medications on file as of 04/14/2015.    Review of Systems  Constitutional: Negative for appetite change and unexpected  weight change.  HENT: Negative for congestion and sinus pressure.   Eyes: Positive for discharge. Negative for pain.       Some crusting.   Respiratory: Negative for cough, chest tightness and shortness of breath.   Cardiovascular: Negative for chest pain, palpitations and leg swelling.  Gastrointestinal: Negative for nausea, vomiting, abdominal pain and diarrhea.  Genitourinary: Negative for dysuria and difficulty urinating.  Musculoskeletal: Negative for myalgias and back pain.       Right groin pain as outlined.  Skin: Negative for color change.       Rash improved.   Neurological: Negative for dizziness, light-headedness and headaches.  Psychiatric/Behavioral: Negative for dysphoric mood and agitation.       Objective:    Physical Exam  Constitutional: He appears well-developed and well-nourished. No distress.  HENT:  Nose: Nose  normal.  Mouth/Throat: Oropharynx is clear and moist.  Eyes:  Some minimal erythema - right eye.  Minimal drainage.  No pain.   Neck: Neck supple. No thyromegaly present.  Cardiovascular: Normal rate and regular rhythm.   Left carotid bruit.  Pulmonary/Chest: Effort normal and breath sounds normal. No respiratory distress.  Abdominal: Soft. Bowel sounds are normal. There is no tenderness.  Musculoskeletal: He exhibits no edema or tenderness.  No significant pain with abduction of right lower leg.  No significant pain with straight leg raise.   Lymphadenopathy:    He has no cervical adenopathy.  Skin: Skin is warm and dry. No erythema.  Rash improved.   Psychiatric: He has a normal mood and affect. His behavior is normal.    BP 122/70 mmHg  Pulse 86  Temp(Src) 98.6 F (37 C) (Oral)  Resp 18  Ht 5' 7.5" (1.715 m)  Wt 196 lb 4 oz (89.018 kg)  BMI 30.27 kg/m2  SpO2 96% Wt Readings from Last 3 Encounters:  04/14/15 196 lb 4 oz (89.018 kg)  11/04/14 194 lb (87.998 kg)  06/30/14 195 lb 6 oz (88.622 kg)     Lab Results  Component Value Date   WBC 7.1 04/07/2015   HGB 13.6 04/07/2015   HCT 39.8 04/07/2015   PLT 198.0 04/07/2015   GLUCOSE 107* 04/07/2015   CHOL 185 04/07/2015   TRIG 153.0* 04/07/2015   HDL 34.80* 04/07/2015   LDLCALC 119* 04/07/2015   ALT 20 04/07/2015   AST 16 04/07/2015   NA 140 04/07/2015   K 3.7 04/07/2015   CL 100 04/07/2015   CREATININE 0.86 04/07/2015   BUN 13 04/07/2015   CO2 29 04/07/2015   TSH 1.93 04/07/2015   PSA 1.50 04/07/2015   HGBA1C 6.0 04/07/2015   MICROALBUR 2.0* 06/23/2014        Assessment & Plan:   Problem List Items Addressed This Visit    Abnormal liver function tests    Recent liver panel wnl.  Follow.   Lab Results  Component Value Date   ALT 20 04/07/2015   AST 16 04/07/2015   ALKPHOS 38* 04/07/2015   BILITOT 0.7 04/07/2015        Relevant Orders   Hepatic function panel   Conjunctivitis    cipro eye drops  as directed.  Follow.       Diabetes mellitus ([place])    Discussed diet and exercise.  Follow met b and a1c.  Sugars have been under good control.  Discussed regular eye exams.   Lab Results  Component Value Date   HGBA1C 6.0 04/07/2015  Relevant Orders   Lipid panel   Hemoglobin A1c   Microalbumin / creatinine urine ratio   Elevated PSA measurement    psa 04/07/15 - 1.50.        GERD (gastroesophageal reflux disease)    Symptoms controlled on nexium.        History of colonic polyps    Colonoscopy 08/30/13.  Recommended f/u colonoscopy in three years.        Hypertension - Primary    Blood pressure under good control.  Continue same medication regimen.  Follow pressures.  Follow metabolic panel.        Relevant Orders   Basic metabolic panel   Psoriatic arthritis ([place])    On cosentyx now.  Doing well.  Skin better.  Followed by Dr Shane Mcknight.       Right groin pain    Intermittent flares.  Better now.  No significant pain on exam.  Desires no further w/up at this time.  Follow.  Also was questioning hernia - left.  Desired not to have checked and desired no further w/up at this time.  Follow.  Will notify me if symptoms change or if he changes his mind.            Einar Pheasant, MD

## 2015-04-14 NOTE — Progress Notes (Signed)
Pre-visit discussion using our clinic review tool. No additional management support is needed unless otherwise documented below in the visit note.  

## 2015-04-15 ENCOUNTER — Encounter: Payer: Self-pay | Admitting: Internal Medicine

## 2015-04-15 DIAGNOSIS — R1031 Right lower quadrant pain: Secondary | ICD-10-CM | POA: Insufficient documentation

## 2015-04-15 DIAGNOSIS — H109 Unspecified conjunctivitis: Secondary | ICD-10-CM | POA: Insufficient documentation

## 2015-04-15 NOTE — Assessment & Plan Note (Signed)
Blood pressure under good control.  Continue same medication regimen.  Follow pressures.  Follow metabolic panel.   

## 2015-04-15 NOTE — Assessment & Plan Note (Signed)
cipro eye drops as directed.  Follow.

## 2015-04-15 NOTE — Assessment & Plan Note (Signed)
Discussed diet and exercise.  Follow met b and a1c.  Sugars have been under good control.  Discussed regular eye exams.   Lab Results  Component Value Date   HGBA1C 6.0 04/07/2015

## 2015-04-15 NOTE — Assessment & Plan Note (Signed)
On cosentyx now.  Doing well.  Skin better.  Followed by Dr Jefm Bryant.

## 2015-04-15 NOTE — Assessment & Plan Note (Signed)
Intermittent flares.  Better now.  No significant pain on exam.  Desires no further w/up at this time.  Follow.  Also was questioning hernia - left.  Desired not to have checked and desired no further w/up at this time.  Follow.  Will notify me if symptoms change or if he changes his mind.

## 2015-04-15 NOTE — Assessment & Plan Note (Signed)
Symptoms controlled on nexium.   

## 2015-04-15 NOTE — Assessment & Plan Note (Signed)
Recent liver panel wnl.  Follow.   Lab Results  Component Value Date   ALT 20 04/07/2015   AST 16 04/07/2015   ALKPHOS 38* 04/07/2015   BILITOT 0.7 04/07/2015

## 2015-04-15 NOTE — Assessment & Plan Note (Addendum)
psa 04/07/15 - 1.50.

## 2015-04-15 NOTE — Assessment & Plan Note (Signed)
Colonoscopy 08/30/13.  Recommended f/u colonoscopy in three years.

## 2015-05-06 ENCOUNTER — Other Ambulatory Visit: Payer: Self-pay | Admitting: Internal Medicine

## 2015-05-18 ENCOUNTER — Emergency Department
Admission: EM | Admit: 2015-05-18 | Discharge: 2015-05-18 | Disposition: A | Discharge status: Home / Self Care | Payer: BLUE CROSS/BLUE SHIELD | Attending: Emergency Medicine | Admitting: Emergency Medicine

## 2015-05-18 DIAGNOSIS — Z87891 Personal history of nicotine dependence: Secondary | ICD-10-CM | POA: Diagnosis not present

## 2015-05-18 DIAGNOSIS — L4050 Arthropathic psoriasis, unspecified: Secondary | ICD-10-CM | POA: Insufficient documentation

## 2015-05-18 DIAGNOSIS — Z7984 Long term (current) use of oral hypoglycemic drugs: Secondary | ICD-10-CM | POA: Diagnosis not present

## 2015-05-18 DIAGNOSIS — I10 Essential (primary) hypertension: Secondary | ICD-10-CM | POA: Insufficient documentation

## 2015-05-18 DIAGNOSIS — R1310 Dysphagia, unspecified: Secondary | ICD-10-CM

## 2015-05-18 DIAGNOSIS — Z7982 Long term (current) use of aspirin: Secondary | ICD-10-CM | POA: Insufficient documentation

## 2015-05-18 DIAGNOSIS — E119 Type 2 diabetes mellitus without complications: Secondary | ICD-10-CM | POA: Insufficient documentation

## 2015-05-18 DIAGNOSIS — Z79899 Other long term (current) drug therapy: Secondary | ICD-10-CM | POA: Insufficient documentation

## 2015-05-18 LAB — BASIC METABOLIC PANEL
Anion gap: 9 (ref 5–15)
BUN: 14 mg/dL (ref 6–20)
CHLORIDE: 104 mmol/L (ref 101–111)
CO2: 26 mmol/L (ref 22–32)
CREATININE: 0.88 mg/dL (ref 0.61–1.24)
Calcium: 8.9 mg/dL (ref 8.9–10.3)
GFR calc Af Amer: >60 mL/min (ref >60)
GFR calc non Af Amer: >60 mL/min (ref >60)
GLUCOSE: 124 mg/dL — AB (ref 65–99)
POTASSIUM: 3.3 mmol/L — AB (ref 3.5–5.1)
Sodium: 139 mmol/L (ref 135–145)

## 2015-05-18 LAB — CBC WITH DIFFERENTIAL/PLATELET
Basophils Absolute: 0 10*3/uL (ref 0–0.1)
Basophils Relative: 0 %
EOS PCT: 6 %
Eosinophils Absolute: 0.4 10*3/uL (ref 0–0.7)
HCT: 36.1 % — ABNORMAL LOW (ref 40.0–52.0)
HEMOGLOBIN: 12.6 g/dL — AB (ref 13.0–18.0)
LYMPHS ABS: 2.1 10*3/uL (ref 1.0–3.6)
LYMPHS PCT: 29 %
MCH: 30.1 pg (ref 26.0–34.0)
MCHC: 35 g/dL (ref 32.0–36.0)
MCV: 86.2 fL (ref 80.0–100.0)
MONOS PCT: 11 %
Monocytes Absolute: 0.8 10*3/uL (ref 0.2–1.0)
NEUTROS PCT: 54 %
Neutro Abs: 4 10*3/uL (ref 1.4–6.5)
Platelets: 173 10*3/uL (ref 150–440)
RBC: 4.19 MIL/uL — AB (ref 4.40–5.90)
RDW: 14.7 % — ABNORMAL HIGH (ref 11.5–14.5)
WBC: 7.4 10*3/uL (ref 3.8–10.6)

## 2015-05-18 NOTE — ED Notes (Signed)
Assisted pt up to bathroom, pt ambulated with steady gait. NAD noted.

## 2015-05-18 NOTE — ED Notes (Signed)
Pt reports hx of hiatal hernia; reports tightness in center of chest. Pt was told by surgeon to come to ED. Pt reports about 50% of everything he eats, he vomits.

## 2015-05-18 NOTE — ED Notes (Signed)
Pt provided with water per Dr. Archie Balboa. Pt able to take sips of water, pt coughing at times. Pt denies difficulty breathing but states hard to swallow.

## 2015-05-18 NOTE — ED Provider Notes (Signed)
[facility] Emergency Department Provider Note    ____________________________________________  Time seen: ~1850  I have reviewed the triage vital signs and the nursing notes.   HISTORY  Chief Complaint Difficulty with swallowing  History limited by: Not Limited   HPI Shane Mcknight is a 65 y.o. male who presents to the emergency department today because of concerns for increasing difficulty with swallowing. The patient states that he has a history of esophageal strictures. He states he has had an impaction a number of years ago that required endoscopy removal and dilatation. He states that for the past couple of weeks he has noticed increasing difficulty with swallowing. He states that he will feel the same sort of feelings he had with his impaction. He states this now occurring both with solid and liquid food. He states that he is able to clear himself occasionally and when he does he feels fine. He stated that he thinks he is getting roughly half of what he swallows through. He denies any fevers. He called his GI doctor's office and they referred him to the emergency department.    Past Medical History  Diagnosis Date  . Hypertension   . Diabetes mellitus ([place])     type 2  . Abnormal liver function tests   . Psoriasis   . Hiatal hernia   . Psoriatic arthritis ([place])   . Sleep apnea     Patient Active Problem List   Diagnosis Date Noted  . Right groin pain 04/15/2015  . Conjunctivitis 04/15/2015  . Health care maintenance 07/10/2014  . Left leg swelling 05/10/2014  . Sinusitis, acute 03/11/2014  . History of colonic polyps 09/10/2013  . Back pain 05/16/2013  . Change in vision 05/16/2013  . Elevated PSA measurement 04/25/2013  . Skin lesion 04/12/2013  . Rectal bleeding 04/12/2013  . Obstructive sleep apnea 03/16/2012  . Diabetes mellitus ([place]) 01/30/2012  . Hypertension 01/30/2012  . Psoriatic arthritis ([place]) 01/30/2012  . Abnormal liver  function tests 01/30/2012  . GERD (gastroesophageal reflux disease) 01/30/2012    Past Surgical History  Procedure Laterality Date  . Cholecystectomy    . Rotator cuff repair    . Arthroplasty      right thumb  . Carpal tunnel release      right  . Lumbar disc surgery      Current Outpatient Rx  Name  Route  Sig  Dispense  Refill  . aspirin 81 MG tablet   Oral   Take 81 mg by mouth daily.         Marland Kitchen atenolol (TENORMIN) 50 MG tablet      TAKE 1 TABLET BY MOUTH TWICE DAILY   60 tablet   7   . ciprofloxacin (CILOXAN) 0.3 % ophthalmic solution   Right Eye   Place 1 drop into the right eye every 6 (six) hours.   5 mL   0   . COSENTYX SENSOREADY PEN 150 MG/ML SOAJ            10     Dispense as written.   Marland Kitchen esomeprazole (NEXIUM) 40 MG capsule      TAKE 1 CAPSULE BY MOUTH TWICE DAILY   60 capsule   9   . fish oil-omega-3 fatty acids 1000 MG capsule   Oral   Take 2 g by mouth daily.         . folic acid (FOLVITE) 1 MG tablet   Oral   Take 1 mg  by mouth daily.         Marland Kitchen gabapentin (NEURONTIN) 300 MG capsule   Oral   Take 300 mg by mouth 3 (three) times daily as needed.         . hydrochlorothiazide (HYDRODIURIL) 25 MG tablet      TAKE 1 TABLET BY MOUTH EVERY DAY   30 tablet   11   . losartan (COZAAR) 50 MG tablet      TAKE 1 TABLET BY MOUTH EVERY DAY   90 tablet   1   . metFORMIN (GLUCOPHAGE) 500 MG tablet      TAKE 4 TABLETS BY MOUTH EVERY EVENING.   120 tablet   11   . Multiple Vitamin (MULTIVITAMIN) tablet   Oral   Take 1 tablet by mouth daily.         Marland Kitchen triamcinolone (NASACORT AQ) 55 MCG/ACT AERO nasal inhaler   Nasal   Place 2 sprays into the nose daily.   1 Inhaler   3   . vitamin E 400 UNIT capsule   Oral   Take 400 Units by mouth daily.           Allergies Amoxicillin; Ephedrine; Hydrocodone-acetaminophen; Levsin; Naprosyn; Penicillins; Sudafed; Vancomycin; Other; and Shellfish allergy  Family History  Problem  Relation Age of Onset  . Lung cancer Father   . Hypertension Mother     Social History Social History  Substance Use Topics  . Smoking status: Former Smoker    Quit date: 07/06/1982  . Smokeless tobacco: Never Used  . Alcohol Use: No    Review of Systems  Constitutional: Negative for fever. Cardiovascular: Negative for chest pain. Respiratory: Negative for shortness of breath. Gastrointestinal: Positive for difficulty with swallowing. Neurological: Negative for headaches, focal weakness or numbness.   10-point ROS otherwise negative.  ____________________________________________   PHYSICAL EXAM:  VITAL SIGNS: ED Triage Vitals  Enc Vitals Group     BP 05/18/15 1818 128/81 mmHg     Pulse Rate 05/18/15 1818 69     Resp 05/18/15 1818 16     Temp 05/18/15 1818 98 F (36.7 C)     Temp Source 05/18/15 1818 Oral     SpO2 05/18/15 1818 99 %     Weight 05/18/15 1818 190 lb (86.183 kg)     Height 05/18/15 1818 5\' 8"  (1.727 m)   Constitutional: Alert and oriented. Well appearing and in no distress. Eyes: Conjunctivae are normal. PERRL. Normal extraocular movements. ENT   Head: Normocephalic and atraumatic.   Nose: No congestion/rhinnorhea.   Mouth/Throat: Mucous membranes are moist.   Neck: No stridor. Hematological/Lymphatic/Immunilogical: No cervical lymphadenopathy. Cardiovascular: Normal rate, regular rhythm.  No murmurs, rubs, or gallops. Respiratory: Normal respiratory effort without tachypnea nor retractions. Breath sounds are clear and equal bilaterally. No wheezes/rales/rhonchi. Gastrointestinal: Soft and nontender. No distention.  Genitourinary: Deferred Musculoskeletal: Normal range of motion in all extremities. No joint effusions.  No lower extremity tenderness nor edema. Neurologic:  Normal speech and language. No gross focal neurologic deficits are appreciated.  Skin:  Skin is warm, dry and intact. No rash noted. Psychiatric: Mood and affect are  normal. Speech and behavior are normal. Patient exhibits appropriate insight and judgment.  ____________________________________________    LABS (pertinent positives/negatives)  Labs Reviewed  CBC WITH DIFFERENTIAL/PLATELET - Abnormal; Notable for the following:    RBC 4.19 (*)    Hemoglobin 12.6 (*)    HCT 36.1 (*)    RDW 14.7 (*)  All other components within normal limits  BASIC METABOLIC PANEL - Abnormal; Notable for the following:    Potassium 3.3 (*)    Glucose, Bld 124 (*)    All other components within normal limits     ____________________________________________   EKG  I, Nance Pear, attending physician, personally viewed and interpreted this EKG  EKG Time: 1817 Rate: 61 Rhythm: normal sinus rhythm Axis: normal Intervals: qtc 398 QRS: narrow, q waves III, V1 ST changes: no st elevation, t wave inversion III Impression: abnormal ekg  ____________________________________________    RADIOLOGY  None   ___________________________________________   PROCEDURES  Procedure(s) performed: None  Critical Care performed: No  ____________________________________________   INITIAL IMPRESSION / ASSESSMENT AND PLAN / ED COURSE  Pertinent labs & imaging results that were available during my care of the patient were reviewed by me and considered in my medical decision making (see chart for details).  Patient with history of esophageal strictures and impaction presents to the emergency department with intermittent difficulty with swallowing. On exam patient appears well. Will check blood work to evaluate for electrolytes, leukocytosis. Will contact GI.   Patient's blood work without any concerning findings per patient was able to drink water and keep it down. I discussed with GI who thought that given patient's ability to currently drink water would be better to schedule as an outpatient. They will try to help arrange appointment at GI clinic  tomorrow. ____________________________________________   FINAL CLINICAL IMPRESSION(S) / ED DIAGNOSES  Final diagnoses:  Difficulty swallowing     Nance Pear, MD 05/18/15 2159

## 2015-05-18 NOTE — ED Notes (Signed)

## 2015-05-18 NOTE — Discharge Instructions (Signed)
As we discussed please stay to a liquid and milkshake diet. Please seek medical attention for any high fevers, chest pain, shortness of breath, change in behavior, persistent vomiting, bloody stool or any other new or concerning symptoms.   Dysphagia Swallowing problems (dysphagia) occur when solids and liquids seem to stick in your throat on the way down to your stomach, or the food takes longer to get to the stomach. Other symptoms include regurgitating food, noises coming from the throat, chest discomfort with swallowing, and a feeling of fullness or the feeling of something being stuck in your throat when swallowing. When blockage in your throat is complete, it may be associated with drooling. CAUSES  Problems with swallowing may occur because of problems with the muscles. The food cannot be propelled in the usual manner into your stomach. You may have ulcers, scar tissue, or inflammation in the tube down which food travels from your mouth to your stomach (esophagus), which blocks food from passing normally into the stomach. Causes of inflammation include:  Acid reflux from your stomach into your esophagus.  Infection.  Radiation treatment for cancer.  Medicines taken without enough fluids to wash them down into your stomach. You may have nerve problems that prevent signals from being sent to the muscles of your esophagus to contract and move your food down to your stomach. Globus pharyngeus is a relatively common problem in which there is a sense of an obstruction or difficulty in swallowing, without any physical abnormalities of the swallowing passages being found. This problem usually improves over time with reassurance and testing to rule out other causes. DIAGNOSIS Dysphagia can be diagnosed and its cause can be determined by tests in which you swallow a white substance that helps illuminate the inside of your throat (contrast medium) while X-rays are taken. Sometimes a flexible telescope  that is inserted down your throat (endoscopy) to look at your esophagus and stomach is used. TREATMENT   If the dysphagia is caused by acid reflux or infection, medicines may be used.  If the dysphagia is caused by problems with your swallowing muscles, swallowing therapy may be used to help you strengthen your swallowing muscles.  If the dysphagia is caused by a blockage or mass, procedures to remove the blockage may be done. HOME CARE INSTRUCTIONS  Try to eat soft food that is easier to swallow and check your weight on a daily basis to be sure that it is not decreasing.  Be sure to drink liquids when sitting upright (not lying down). SEEK MEDICAL CARE IF:  You are losing weight because you are unable to swallow.  You are coughing when you drink liquids (aspiration).  You are coughing up partially digested food. SEEK IMMEDIATE MEDICAL CARE IF:  You are unable to swallow your own saliva .  You are having shortness of breath or a fever, or both.  You have a hoarse voice along with difficulty swallowing. MAKE SURE YOU:  Understand these instructions.  Will watch your condition.  Will get help right away if you are not doing well or get worse.   This information is not intended to replace advice given to you by your health care provider. Make sure you discuss any questions you have with your health care provider.   Document Released: 12/29/1999 Document Revised: 01/21/2014 Document Reviewed: 06/19/2012 Elsevier Interactive Patient Education [employer].

## 2015-05-19 ENCOUNTER — Other Ambulatory Visit: Payer: Self-pay | Admitting: Gastroenterology

## 2015-05-19 DIAGNOSIS — R1310 Dysphagia, unspecified: Secondary | ICD-10-CM

## 2015-05-24 ENCOUNTER — Ambulatory Visit
Admission: RE | Admit: 2015-05-24 | Discharge: 2015-05-24 | Disposition: A | Discharge status: Home / Self Care | Payer: BLUE CROSS/BLUE SHIELD | Source: Ambulatory Visit | Attending: Gastroenterology | Admitting: Gastroenterology

## 2015-05-24 DIAGNOSIS — R1310 Dysphagia, unspecified: Secondary | ICD-10-CM | POA: Insufficient documentation

## 2015-05-24 DIAGNOSIS — K219 Gastro-esophageal reflux disease without esophagitis: Secondary | ICD-10-CM | POA: Insufficient documentation

## 2015-05-24 DIAGNOSIS — K222 Esophageal obstruction: Secondary | ICD-10-CM | POA: Diagnosis not present

## 2015-05-24 DIAGNOSIS — K449 Diaphragmatic hernia without obstruction or gangrene: Secondary | ICD-10-CM | POA: Insufficient documentation

## 2015-05-26 ENCOUNTER — Encounter: Payer: Self-pay | Admitting: *Deleted

## 2015-05-29 ENCOUNTER — Ambulatory Visit
Admission: RE | Admit: 2015-05-29 | Discharge: 2015-05-29 | Disposition: A | Discharge status: Home / Self Care | Payer: BLUE CROSS/BLUE SHIELD | Source: Ambulatory Visit | Attending: Gastroenterology | Admitting: Gastroenterology

## 2015-05-29 ENCOUNTER — Encounter
Admission: RE | Disposition: A | Discharge status: Home / Self Care | Payer: Self-pay | Source: Ambulatory Visit | Attending: Gastroenterology

## 2015-05-29 ENCOUNTER — Ambulatory Visit: Payer: BLUE CROSS/BLUE SHIELD | Admitting: Anesthesiology

## 2015-05-29 ENCOUNTER — Encounter: Payer: Self-pay | Admitting: *Deleted

## 2015-05-29 DIAGNOSIS — Z79899 Other long term (current) drug therapy: Secondary | ICD-10-CM | POA: Insufficient documentation

## 2015-05-29 DIAGNOSIS — Z8249 Family history of ischemic heart disease and other diseases of the circulatory system: Secondary | ICD-10-CM | POA: Diagnosis not present

## 2015-05-29 DIAGNOSIS — Z9049 Acquired absence of other specified parts of digestive tract: Secondary | ICD-10-CM | POA: Insufficient documentation

## 2015-05-29 DIAGNOSIS — E7800 Pure hypercholesterolemia, unspecified: Secondary | ICD-10-CM | POA: Diagnosis not present

## 2015-05-29 DIAGNOSIS — M1990 Unspecified osteoarthritis, unspecified site: Secondary | ICD-10-CM | POA: Insufficient documentation

## 2015-05-29 DIAGNOSIS — B3781 Candidal esophagitis: Secondary | ICD-10-CM | POA: Insufficient documentation

## 2015-05-29 DIAGNOSIS — M67431 Ganglion, right wrist: Secondary | ICD-10-CM | POA: Diagnosis not present

## 2015-05-29 DIAGNOSIS — L4050 Arthropathic psoriasis, unspecified: Secondary | ICD-10-CM | POA: Insufficient documentation

## 2015-05-29 DIAGNOSIS — Z801 Family history of malignant neoplasm of trachea, bronchus and lung: Secondary | ICD-10-CM | POA: Insufficient documentation

## 2015-05-29 DIAGNOSIS — K222 Esophageal obstruction: Secondary | ICD-10-CM | POA: Diagnosis not present

## 2015-05-29 DIAGNOSIS — Z91013 Allergy to seafood: Secondary | ICD-10-CM | POA: Diagnosis not present

## 2015-05-29 DIAGNOSIS — Z881 Allergy status to other antibiotic agents status: Secondary | ICD-10-CM | POA: Insufficient documentation

## 2015-05-29 DIAGNOSIS — G43909 Migraine, unspecified, not intractable, without status migrainosus: Secondary | ICD-10-CM | POA: Diagnosis not present

## 2015-05-29 DIAGNOSIS — Z888 Allergy status to other drugs, medicaments and biological substances status: Secondary | ICD-10-CM | POA: Insufficient documentation

## 2015-05-29 DIAGNOSIS — G4730 Sleep apnea, unspecified: Secondary | ICD-10-CM | POA: Insufficient documentation

## 2015-05-29 DIAGNOSIS — Z87891 Personal history of nicotine dependence: Secondary | ICD-10-CM | POA: Insufficient documentation

## 2015-05-29 DIAGNOSIS — Z880 Allergy status to penicillin: Secondary | ICD-10-CM | POA: Insufficient documentation

## 2015-05-29 DIAGNOSIS — R7989 Other specified abnormal findings of blood chemistry: Secondary | ICD-10-CM | POA: Diagnosis not present

## 2015-05-29 DIAGNOSIS — Z809 Family history of malignant neoplasm, unspecified: Secondary | ICD-10-CM | POA: Insufficient documentation

## 2015-05-29 DIAGNOSIS — Z86010 Personal history of colonic polyps: Secondary | ICD-10-CM | POA: Insufficient documentation

## 2015-05-29 DIAGNOSIS — Z885 Allergy status to narcotic agent status: Secondary | ICD-10-CM | POA: Diagnosis not present

## 2015-05-29 DIAGNOSIS — E119 Type 2 diabetes mellitus without complications: Secondary | ICD-10-CM | POA: Insufficient documentation

## 2015-05-29 DIAGNOSIS — R1310 Dysphagia, unspecified: Secondary | ICD-10-CM | POA: Diagnosis present

## 2015-05-29 DIAGNOSIS — M5116 Intervertebral disc disorders with radiculopathy, lumbar region: Secondary | ICD-10-CM | POA: Insufficient documentation

## 2015-05-29 DIAGNOSIS — Z7982 Long term (current) use of aspirin: Secondary | ICD-10-CM | POA: Insufficient documentation

## 2015-05-29 DIAGNOSIS — I10 Essential (primary) hypertension: Secondary | ICD-10-CM | POA: Diagnosis not present

## 2015-05-29 DIAGNOSIS — K449 Diaphragmatic hernia without obstruction or gangrene: Secondary | ICD-10-CM | POA: Diagnosis not present

## 2015-05-29 HISTORY — DX: Elevation of levels of liver transaminase levels: R74.01

## 2015-05-29 HISTORY — DX: Nonspecific elevation of levels of transaminase and lactic acid dehydrogenase (ldh): R74.0

## 2015-05-29 HISTORY — DX: Pure hypercholesterolemia, unspecified: E78.00

## 2015-05-29 HISTORY — PX: ESOPHAGOGASTRODUODENOSCOPY (EGD) WITH PROPOFOL: SHX5813

## 2015-05-29 HISTORY — PX: BALLOON DILATION: SHX5330

## 2015-05-29 HISTORY — DX: Allergy, unspecified, initial encounter: T78.40XA

## 2015-05-29 HISTORY — DX: Gastro-esophageal reflux disease without esophagitis: K21.9

## 2015-05-29 HISTORY — DX: Unspecified osteoarthritis, unspecified site: M19.90

## 2015-05-29 HISTORY — DX: Polyp of colon: K63.5

## 2015-05-29 LAB — GLUCOSE, CAPILLARY: GLUCOSE-CAPILLARY: 144 mg/dL — AB (ref 65–99)

## 2015-05-29 SURGERY — ESOPHAGOGASTRODUODENOSCOPY (EGD) WITH PROPOFOL
Anesthesia: General

## 2015-05-29 MED ORDER — PROPOFOL 500 MG/50ML IV EMUL
INTRAVENOUS | Status: DC | PRN
  Administered 2015-05-29: 150 ug/kg/min via INTRAVENOUS

## 2015-05-29 MED ORDER — EPHEDRINE SULFATE 50 MG/ML IJ SOLN
INTRAMUSCULAR | Status: DC | PRN
  Administered 2015-05-29 (×2): 10 mg via INTRAVENOUS

## 2015-05-29 MED ORDER — PROPOFOL 10 MG/ML IV BOLUS
INTRAVENOUS | Status: DC | PRN
  Administered 2015-05-29: 20 mg via INTRAVENOUS
  Administered 2015-05-29: 50 mg via INTRAVENOUS
  Administered 2015-05-29: 20 mg via INTRAVENOUS
  Administered 2015-05-29: 40 mg via INTRAVENOUS
  Administered 2015-05-29: 30 mg via INTRAVENOUS

## 2015-05-29 MED ORDER — SODIUM CHLORIDE 0.9 % IV SOLN
INTRAVENOUS | Status: DC
  Administered 2015-05-29: 10:00:00 via INTRAVENOUS

## 2015-05-29 MED ORDER — LIDOCAINE 2% (20 MG/ML) 5 ML SYRINGE
INTRAMUSCULAR | Status: DC | PRN
  Administered 2015-05-29: 100 mg via INTRAVENOUS

## 2015-05-29 MED ORDER — SODIUM CHLORIDE 0.9 % IV SOLN: INTRAVENOUS | Status: DC

## 2015-05-29 MED ORDER — FENTANYL CITRATE (PF) 100 MCG/2ML IJ SOLN
INTRAMUSCULAR | Status: DC | PRN
  Administered 2015-05-29: 50 ug via INTRAVENOUS

## 2015-05-29 NOTE — Anesthesia Postprocedure Evaluation (Signed)
Anesthesia Post Note  Patient: Grahm O Bickert  Procedure(s) Performed: Procedure(s) (LRB): ESOPHAGOGASTRODUODENOSCOPY (EGD) WITH PROPOFOL (N/A) BALLOON DILATION (N/A)  Patient location during evaluation: PACU Anesthesia Type: General Level of consciousness: awake and alert Pain management: pain level controlled Vital Signs Assessment: post-procedure vital signs reviewed and stable Respiratory status: spontaneous breathing, nonlabored ventilation, respiratory function stable and patient connected to nasal cannula oxygen Cardiovascular status: blood pressure returned to baseline and stable Postop Assessment: no signs of nausea or vomiting Anesthetic complications: no    Last Vitals:  Filed Vitals:   05/29/15 0901 05/29/15 1027  BP: 140/94 107/60  Pulse: 59 76  Temp: 36.7 C 36 C  Resp: 14 15    Last Pain: There were no vitals filed for this visit.               [name]

## 2015-05-29 NOTE — Transfer of Care (Signed)
Immediate Anesthesia Transfer of Care Note  Patient: Shane Mcknight  Procedure(s) Performed: Procedure(s): ESOPHAGOGASTRODUODENOSCOPY (EGD) WITH PROPOFOL (N/A) BALLOON DILATION (N/A)  Patient Location: PACU  Anesthesia Type:General  Level of Consciousness: awake  Airway & Oxygen Therapy: Patient Spontanous Breathing and Patient connected to nasal cannula oxygen  Post-op Assessment: Report given to RN and Post -op Vital signs reviewed and stable  Post vital signs: Reviewed and stable  Last Vitals:  Filed Vitals:   05/29/15 0901 05/29/15 1027  BP: 140/94 107/60  Pulse: 59 76  Temp: 36.7 C 36 C  Resp: 14 15    Last Pain: There were no vitals filed for this visit.       Complications: No apparent anesthesia complications

## 2015-05-29 NOTE — H&P (Signed)
Outpatient short stay form Pre-procedure 05/29/2015 9:41 AM Lollie Sails MD  Primary Physician: Dr. Einar Pheasant  Reason for visit:  Dysphagia  History of present illness:  Patient is a 65 year old male presenting today for EGD. He has had several weeks of increasing symptoms of dysphagia. He did have a barium swallow showing a focal mild persistent narrowing at the distal esophagus/GE junction that didn't restrict passage of the sizing tablet. There is also mild esophageal reflux and a moderate size hiatal hernia. He has esophageal dysmotility as well. I did dilate him on his last procedure which was 06/08/2010. He has done well since that time.  He does take a 81 mg aspirin which she has held for a day and a half. He takes no other aspirin products or blood thinning agents.    Current facility-administered medications:  .  0.9 %  sodium chloride infusion, , Intravenous, Continuous, Lollie Sails, MD, Last Rate: 20 mL/hr at 05/29/15 0931 .  0.9 %  sodium chloride infusion, , Intravenous, Continuous, Lollie Sails, MD  Prescriptions prior to admission  Medication Sig Dispense Refill Last Dose  . atenolol (TENORMIN) 50 MG tablet TAKE 1 TABLET BY MOUTH TWICE DAILY 60 tablet 7 05/29/2015 at 0430  . hydrochlorothiazide (HYDRODIURIL) 25 MG tablet TAKE 1 TABLET BY MOUTH EVERY DAY 30 tablet 11 05/29/2015 at 0430  . losartan (COZAAR) 50 MG tablet TAKE 1 TABLET BY MOUTH EVERY DAY 90 tablet 1 05/29/2015 at 0430  . oxycodone (OXY-IR) 5 MG capsule Take 5 mg by mouth 2 (two) times daily as needed.     Marland Kitchen aspirin 81 MG tablet Take 81 mg by mouth daily.   Taking  . ciprofloxacin (CILOXAN) 0.3 % ophthalmic solution Place 1 drop into the right eye every 6 (six) hours. 5 mL 0   . COSENTYX SENSOREADY PEN 150 MG/ML SOAJ   10 Taking  . esomeprazole (NEXIUM) 40 MG capsule TAKE 1 CAPSULE BY MOUTH TWICE DAILY 60 capsule 9 Taking  . fish oil-omega-3 fatty acids 1000 MG capsule Take 2 g by mouth daily.    Taking  . folic acid (FOLVITE) 1 MG tablet Take 1 mg by mouth daily.   Taking  . gabapentin (NEURONTIN) 300 MG capsule Take 300 mg by mouth 3 (three) times daily as needed.   Taking  . metFORMIN (GLUCOPHAGE) 500 MG tablet TAKE 4 TABLETS BY MOUTH EVERY EVENING. 120 tablet 11   . Multiple Vitamin (MULTIVITAMIN) tablet Take 1 tablet by mouth daily.   Taking  . triamcinolone (NASACORT AQ) 55 MCG/ACT AERO nasal inhaler Place 2 sprays into the nose daily. 1 Inhaler 3 Taking  . vitamin E 400 UNIT capsule Take 400 Units by mouth daily.   Taking     Allergies  Allergen Reactions  . Amoxicillin   . Ephedrine Other (See Comments)    pain  . Hydrocodone-Acetaminophen     Other reaction(s): Other (See Comments) GI Upset  . Levsin [Hyoscyamine Sulfate] Other (See Comments)    Urinary retention  . Naprosyn [Naproxen] Other (See Comments)    GI upset   . Penicillins   . Sudafed [Pseudoephedrine Hcl]   . Vancomycin   . Other Rash    Paper tape  . Shellfish Allergy Rash     Past Medical History  Diagnosis Date  . Hypertension   . Abnormal liver function tests   . Psoriasis   . Hiatal hernia   . Psoriatic arthritis ([place])   . Sleep apnea   .  Allergic state   . Arthritis     spine, hands, shoulder with previous cuff tear  . Colon polyp   . Diabetes mellitus ([place])     type 2 since 09/16/2011  . Elevated transaminase level   . GERD (gastroesophageal reflux disease)   . Hypercholesterolemia     Review of systems:      Physical Exam    Heart and lungs: Regular rate and rhythm without rub or gallop, lungs are bilaterally clear.    HEENT: Normocephalic atraumatic eyes are anicteric    Other:     Pertinant exam for procedure: Soft nontender nondistended bowel sounds positive normoactive.    Planned proceedures: EGD and indicated procedures. I have discussed the risks benefits and complications of procedures to include not limited to bleeding, infection, perforation and the risk of  sedation and the patient wishes to proceed.    Lollie Sails, MD Gastroenterology 05/29/2015  9:41 AM

## 2015-05-29 NOTE — Anesthesia Preprocedure Evaluation (Addendum)
Anesthesia Evaluation  Patient identified by MRN, date of birth, ID band Patient awake    Reviewed: Allergy & Precautions, H&P , NPO status , Patient's Chart, lab work & pertinent test results, reviewed documented beta blocker date and time   Airway Mallampati: III   Neck ROM: full    Dental  (+) Poor Dentition, Partial Upper, Partial Lower   Pulmonary neg pulmonary ROS, sleep apnea , former smoker,    Pulmonary exam normal        Cardiovascular Exercise Tolerance: Good hypertension, On Medications negative cardio ROS Normal cardiovascular exam Rate:Normal     Neuro/Psych  Neuromuscular disease negative neurological ROS  negative psych ROS   GI/Hepatic negative GI ROS, Neg liver ROS, hiatal hernia, GERD  Medicated,  Endo/Other  negative endocrine ROSdiabetes  Renal/GU negative Renal ROS  negative genitourinary   Musculoskeletal   Abdominal   Peds  Hematology negative hematology ROS (+)   Anesthesia Other Findings Past Medical History:   Hypertension                                                 Abnormal liver function tests                                Psoriasis                                                    Hiatal hernia                                                Psoriatic arthritis ([facility])                                    Sleep apnea                                                  Allergic state                                               Arthritis                                                      Comment:spine, hands, shoulder with previous cuff tear   Colon polyp  Diabetes mellitus ([place])                                        Comment:type 2 since 09/16/2011   Elevated transaminase level                                  GERD (gastroesophageal reflux disease)                       Hypercholesterolemia                                       Past  Surgical History:   CHOLECYSTECTOMY                                               ROTATOR CUFF REPAIR                                           ARTHROPLASTY                                                    Comment:right thumb; left thumb   CARPAL TUNNEL RELEASE                                           Comment:right   LUMBAR DISC SURGERY                                           BLEPHAROPLASTY                                                TONSILLECTOMY                                                 HERNIA REPAIR                                                 COLONOSCOPY                                                   ESOPHAGOGASTRODUODENOSCOPY  NASAL SINUS SURGERY                                           CYST EXCISION                                    04/14/2015      Comment:tendon sheath cyst excision; right ring finger               cyst removed and trigger finger realease   Reproductive/Obstetrics                             Anesthesia Physical Anesthesia Plan  ASA: III  Anesthesia Plan: General   Post-op Pain Management:    Induction:   Airway Management Planned:   Additional Equipment:   Intra-op Plan:   Post-operative Plan:   Informed Consent: I have reviewed the patients History and Physical, chart, labs and discussed the procedure including the risks, benefits and alternatives for the proposed anesthesia with the patient or authorized representative who has indicated his/her understanding and acceptance.   Dental Advisory Given  Plan Discussed with: CRNA  Anesthesia Plan Comments: (ecg reviewed from May 17.  No anginal equivalents today or sob.  Primary complaint is dysphagia related and was seen in the ed recently for same.  No cardiac concerns noted.  I feel pt. Is stable on meds and appropriate to proceed .  JA)       Anesthesia Quick Evaluation

## 2015-05-29 NOTE — Op Note (Signed)
[facility] Gastroenterology Patient Name: Trigg Bankowski Procedure Date: 05/29/2015 9:44 AM MRN: [id number] Account #: [id number] Date of Birth: 25-Dec-1950 Admit Type: Outpatient Age: 65 Room: [facility] ENDO ROOM 3 Gender: Male Note Status: Finalized Procedure:            Upper GI endoscopy Indications:          Dysphagia Providers:            Lollie Sails, MD Referring MD:         Einar Pheasant, MD (Referring MD) Medicines:            Monitored Anesthesia Care Complications:        No immediate complications. Procedure:            Pre-Anesthesia Assessment:                       - ASA Grade Assessment: III - A patient with severe                        systemic disease.                       After obtaining informed consent, the endoscope was                        passed under direct vision. Throughout the procedure,                        the patient's blood pressure, pulse, and oxygen                        saturations were monitored continuously. The Endoscope                        was introduced through the mouth, and advanced to the                        third part of duodenum. The upper GI endoscopy was                        performed with moderate difficulty due to narrowing.                        The patient tolerated the procedure well. The patient                        tolerated the procedure well. Findings:      The cricopharyngeus, upper third of the esophagus and lower third of the       esophagus were significantly tortuous.      LA Grade D (one or more mucosal breaks involving at least 75% of       esophageal circumference) esophagitis was found. There were several       areal of erosive esophagitis. The scope would not initially go through       the GE junction, but with careful manipulation, the scope passed into       the gastric vault.      A small hiatal hernia was present.      The exam of the stomach was otherwise normal.      The  in the duodenum was  normal. The inflamed ring at the GE junction was       noted to be opened on withdrawal of the scope and biopsies were taken of       the GE junction.      Patchy candidiasis was found in the upper third of the esophagus, in the       middle third of the esophagus and in the lower third of the esophagus.      One moderate stenosis as noted above was found. This measured 9 mm       (inner diameter) x less than one cm (in length) and was traversed. Impression:           - Tortuous esophagus.                       - LA Grade D esophagitis.                       - Small hiatal hernia.                       - Normal.                       - Monilial esophagitis.                       - No specimens collected. Recommendation:       - Await pathology results.                       - Use Protonix (pantoprazole) 40 mg PO BID for 4 weeks.                       - Repeat upper endoscopy in 4 weeks to check healing                        and for retreatment.                       - Await pathology results.                       - Diflucan (fluconazole) 200 mg po day one then 100 mg                        PO daily for 6 days.                       - Full liquid diet today.                       - Full liquid diet for 1 day.                       - Soft diet for 5 days. Procedure Code(s):    --- Professional ---                       [phone], Esophagogastroduodenoscopy, flexible, transoral;                        diagnostic, including collection of specimen(s) by  brushing or washing, when performed (separate procedure) Diagnosis Code(s):    --- Professional ---                       Q39.9, Congenital malformation of esophagus, unspecified                       K20.9, Esophagitis, unspecified                       K44.9, Diaphragmatic hernia without obstruction or                        gangrene                       B37.81, Candidal esophagitis                        R13.10, Dysphagia, unspecified CPT copyright 2016 American Medical Association. All rights reserved. The codes documented in this report are preliminary and upon coder review may  be revised to meet current compliance requirements. Lollie Sails, MD 05/29/2015 10:32:12 AM This report has been signed electronically. Number of Addenda: 0 Note Initiated On: 05/29/2015 9:44 AM      [facility]

## 2015-05-30 LAB — SURGICAL PATHOLOGY

## 2015-05-31 ENCOUNTER — Encounter: Payer: Self-pay | Admitting: Gastroenterology

## 2015-07-04 ENCOUNTER — Encounter
Admission: RE | Disposition: A | Discharge status: Home / Self Care | Payer: Self-pay | Source: Ambulatory Visit | Attending: Gastroenterology

## 2015-07-04 ENCOUNTER — Ambulatory Visit: Payer: BLUE CROSS/BLUE SHIELD | Admitting: Certified Registered Nurse Anesthetist

## 2015-07-04 ENCOUNTER — Ambulatory Visit
Admission: RE | Admit: 2015-07-04 | Discharge: 2015-07-04 | Disposition: A | Discharge status: Home / Self Care | Payer: BLUE CROSS/BLUE SHIELD | Source: Ambulatory Visit | Attending: Gastroenterology | Admitting: Gastroenterology

## 2015-07-04 DIAGNOSIS — G4730 Sleep apnea, unspecified: Secondary | ICD-10-CM | POA: Diagnosis not present

## 2015-07-04 DIAGNOSIS — Z91048 Other nonmedicinal substance allergy status: Secondary | ICD-10-CM | POA: Diagnosis not present

## 2015-07-04 DIAGNOSIS — Z881 Allergy status to other antibiotic agents status: Secondary | ICD-10-CM | POA: Insufficient documentation

## 2015-07-04 DIAGNOSIS — E7800 Pure hypercholesterolemia, unspecified: Secondary | ICD-10-CM | POA: Diagnosis not present

## 2015-07-04 DIAGNOSIS — I10 Essential (primary) hypertension: Secondary | ICD-10-CM | POA: Insufficient documentation

## 2015-07-04 DIAGNOSIS — R1310 Dysphagia, unspecified: Secondary | ICD-10-CM | POA: Diagnosis present

## 2015-07-04 DIAGNOSIS — Z7982 Long term (current) use of aspirin: Secondary | ICD-10-CM | POA: Diagnosis not present

## 2015-07-04 DIAGNOSIS — Z79899 Other long term (current) drug therapy: Secondary | ICD-10-CM | POA: Diagnosis not present

## 2015-07-04 DIAGNOSIS — L4050 Arthropathic psoriasis, unspecified: Secondary | ICD-10-CM | POA: Insufficient documentation

## 2015-07-04 DIAGNOSIS — K449 Diaphragmatic hernia without obstruction or gangrene: Secondary | ICD-10-CM | POA: Diagnosis not present

## 2015-07-04 DIAGNOSIS — Z91013 Allergy to seafood: Secondary | ICD-10-CM | POA: Insufficient documentation

## 2015-07-04 DIAGNOSIS — Z86010 Personal history of colonic polyps: Secondary | ICD-10-CM | POA: Insufficient documentation

## 2015-07-04 DIAGNOSIS — Z888 Allergy status to other drugs, medicaments and biological substances status: Secondary | ICD-10-CM | POA: Diagnosis not present

## 2015-07-04 DIAGNOSIS — E119 Type 2 diabetes mellitus without complications: Secondary | ICD-10-CM | POA: Insufficient documentation

## 2015-07-04 DIAGNOSIS — K2210 Ulcer of esophagus without bleeding: Secondary | ICD-10-CM | POA: Insufficient documentation

## 2015-07-04 DIAGNOSIS — Z880 Allergy status to penicillin: Secondary | ICD-10-CM | POA: Insufficient documentation

## 2015-07-04 DIAGNOSIS — Z87891 Personal history of nicotine dependence: Secondary | ICD-10-CM | POA: Diagnosis not present

## 2015-07-04 HISTORY — PX: ESOPHAGOGASTRODUODENOSCOPY (EGD) WITH PROPOFOL: SHX5813

## 2015-07-04 LAB — GLUCOSE, CAPILLARY: Glucose-Capillary: 112 mg/dL — ABNORMAL HIGH (ref 65–99)

## 2015-07-04 SURGERY — ESOPHAGOGASTRODUODENOSCOPY (EGD) WITH PROPOFOL
Anesthesia: General

## 2015-07-04 MED ORDER — LIDOCAINE HCL (CARDIAC) 20 MG/ML IV SOLN
INTRAVENOUS | Status: DC | PRN
  Administered 2015-07-04: 50 mg via INTRAVENOUS

## 2015-07-04 MED ORDER — PROPOFOL 10 MG/ML IV BOLUS
INTRAVENOUS | Status: DC | PRN
  Administered 2015-07-04: 20 mg via INTRAVENOUS
  Administered 2015-07-04: 10 mg via INTRAVENOUS
  Administered 2015-07-04: 20 mg via INTRAVENOUS
  Administered 2015-07-04: 30 mg via INTRAVENOUS

## 2015-07-04 MED ORDER — PROPOFOL 500 MG/50ML IV EMUL
INTRAVENOUS | Status: DC | PRN
  Administered 2015-07-04: 140 ug/kg/min via INTRAVENOUS

## 2015-07-04 MED ORDER — MIDAZOLAM HCL 2 MG/2ML IJ SOLN
INTRAMUSCULAR | Status: DC | PRN
  Administered 2015-07-04: 1 mg via INTRAVENOUS

## 2015-07-04 MED ORDER — GLYCOPYRROLATE 0.2 MG/ML IJ SOLN
INTRAMUSCULAR | Status: DC | PRN
  Administered 2015-07-04: 0.2 mg via INTRAVENOUS

## 2015-07-04 MED ORDER — SODIUM CHLORIDE 0.9 % IV SOLN: INTRAVENOUS | Status: DC

## 2015-07-04 MED ORDER — PHENYLEPHRINE HCL 10 MG/ML IJ SOLN
INTRAMUSCULAR | Status: DC | PRN
  Administered 2015-07-04: 100 ug via INTRAVENOUS
  Administered 2015-07-04: 200 ug via INTRAVENOUS

## 2015-07-04 MED ORDER — SODIUM CHLORIDE 0.9 % IV SOLN
INTRAVENOUS | Status: DC
  Administered 2015-07-04: 10:00:00 via INTRAVENOUS

## 2015-07-04 NOTE — Transfer of Care (Signed)
Immediate Anesthesia Transfer of Care Note  Patient: Shane Mcknight  Procedure(s) Performed: Procedure(s): ESOPHAGOGASTRODUODENOSCOPY (EGD) WITH PROPOFOL (N/A)  Patient Location: PACU  Anesthesia Type:General  Level of Consciousness: sedated  Airway & Oxygen Therapy: Patient Spontanous Breathing and Patient connected to nasal cannula oxygen  Post-op Assessment: Report given to RN and Post -op Vital signs reviewed and stable  Post vital signs: Reviewed and stable  Last Vitals:  Filed Vitals:   07/04/15 0931  BP: 116/76  Pulse: 55  Temp: 36.8 C  Resp: 17    Last Pain: There were no vitals filed for this visit.       Complications: No apparent anesthesia complications

## 2015-07-04 NOTE — Anesthesia Postprocedure Evaluation (Signed)
Anesthesia Post Note  Patient: Carlson O Kurek  Procedure(s) Performed: Procedure(s) (LRB): ESOPHAGOGASTRODUODENOSCOPY (EGD) WITH PROPOFOL (N/A)  Patient location during evaluation: Endoscopy Anesthesia Type: General Level of consciousness: awake and alert Pain management: pain level controlled Vital Signs Assessment: post-procedure vital signs reviewed and stable Respiratory status: spontaneous breathing and respiratory function stable Cardiovascular status: stable Anesthetic complications: no    Last Vitals:  Filed Vitals:   07/04/15 1107 07/04/15 1115  BP: 98/55 98/55  Pulse: 62 64  Temp:    Resp: 24 22    Last Pain: There were no vitals filed for this visit.               KEPHART,WILLIAM K

## 2015-07-04 NOTE — Op Note (Addendum)
[facility] Gastroenterology Patient Name: Shane Mcknight Procedure Date: 07/04/2015 10:14 AM MRN: [id number] Account #: [id number] Date of Birth: 28-Feb-1950 Admit Type: Outpatient Age: 65 Room: [facility] ENDO ROOM 3 Gender: Male Note Status: Finalized Procedure:            Upper GI endoscopy Indications:          Dysphagia Providers:            Lollie Sails, MD Referring MD:         Einar Pheasant, MD (Referring MD) Medicines:            Monitored Anesthesia Care Complications:        No immediate complications. Procedure:            Pre-Anesthesia Assessment:                       - ASA Grade Assessment: III - A patient with severe                        systemic disease.                       After obtaining informed consent, the endoscope was                        passed under direct vision. Throughout the procedure,                        the patient's blood pressure, pulse, and oxygen                        saturations were monitored continuously. The Endoscope                        was introduced through the mouth, and advanced to the                        third part of duodenum. The upper GI endoscopy was                        accomplished without difficulty. The patient tolerated                        the procedure well. Findings:      LA Grade C (one or more mucosal breaks continuous between tops of 2 or       more mucosal folds, less than 75% circumference) esophagitis was found       at a esophageal ring at the GE junction. . A TTS dilator was passed       through the scope. There were multiple shallow ulcerations at a ring at       the GE junction. Dilation with a 10-25-10 mm balloon dilator was       performed to 12 mm With some bleeding indicating opening of a ring.      The entire examined stomach was normal.      A small hiatal hernia was present.      The in the duodenum was normal. Impression:           - LA Grade C erosive esophagitis.  Dilated.                       -  Normal stomach.                       - Small hiatal hernia.                       - Normal.                       - No specimens collected. Recommendation:       - Use Protonix (pantoprazole) 40 mg PO BID daily. Procedure Code(s):    --- Professional ---                       [id number], Esophagogastroduodenoscopy, flexible, transoral;                        with transendoscopic balloon dilation of esophagus                        (less than 30 mm diameter) Diagnosis Code(s):    --- Professional ---                       K20.8, Other esophagitis                       K44.9, Diaphragmatic hernia without obstruction or                        gangrene                       R13.10, Dysphagia, unspecified CPT copyright 2016 American Medical Association. All rights reserved. The codes documented in this report are preliminary and upon coder review may  be revised to meet current compliance requirements. Lollie Sails, MD 07/04/2015 10:50:07 AM This report has been signed electronically. Number of Addenda: 0 Note Initiated On: 07/04/2015 10:14 AM      [facility]

## 2015-07-04 NOTE — Anesthesia Procedure Notes (Signed)
Date/Time: 07/04/2015 10:17 AM Performed by: Johnna Acosta Pre-anesthesia Checklist: Patient identified, Emergency Drugs available, Suction available, Patient being monitored and Timeout performed Patient Re-evaluated:Patient Re-evaluated prior to inductionOxygen Delivery Method: Nasal cannula

## 2015-07-04 NOTE — Anesthesia Preprocedure Evaluation (Signed)
Anesthesia Evaluation  Patient identified by MRN, date of birth, ID band Patient awake    Reviewed: Allergy & Precautions, NPO status , Patient's Chart, lab work & pertinent test results  History of Anesthesia Complications Negative for: history of anesthetic complications  Airway Mallampati: I       Dental  (+) Upper Dentures, Lower Dentures   Pulmonary neg pulmonary ROS, sleep apnea , former smoker,           Cardiovascular hypertension, Pt. on home beta blockers and Pt. on medications      Neuro/Psych negative neurological ROS     GI/Hepatic Neg liver ROS, GERD  Medicated,  Endo/Other  diabetes, Type 2, Oral Hypoglycemic Agents  Renal/GU negative Renal ROS     Musculoskeletal   Abdominal   Peds  Hematology negative hematology ROS (+)   Anesthesia Other Findings   Reproductive/Obstetrics                             Anesthesia Physical Anesthesia Plan  ASA: III  Anesthesia Plan: General   Post-op Pain Management:    Induction: Intravenous  Airway Management Planned: Nasal Cannula  Additional Equipment:   Intra-op Plan:   Post-operative Plan:   Informed Consent: I have reviewed the patients History and Physical, chart, labs and discussed the procedure including the risks, benefits and alternatives for the proposed anesthesia with the patient or authorized representative who has indicated his/her understanding and acceptance.     Plan Discussed with:   Anesthesia Plan Comments:         Anesthesia Quick Evaluation

## 2015-07-04 NOTE — H&P (Signed)
Outpatient short stay form Pre-procedure 07/04/2015 10:11 AM Lollie Sails MD  Primary Physician: Dr. Einar Pheasant  Reason for visit:  EGD  History of present illness:  Patient is a 65 year old male presenting today for EGD. He had a procedure done on 05/19/2015 with a finding of grade D erosive esophagitis a peptic stricture as well as some fungal esophagitis. There was a small hiatal hernia. The ring was dilated with passage of the scope. He did have some improvement of his symptoms. He is returning today for reevaluation and retreatment. He has held his aspirin product for 5 days. He takes no other aspirin or blood thinning agents.    Current facility-administered medications:  .  0.9 %  sodium chloride infusion, , Intravenous, Continuous, Lollie Sails, MD, Last Rate: 20 mL/hr at 07/04/15 0939 .  0.9 %  sodium chloride infusion, , Intravenous, Continuous, Lollie Sails, MD  Prescriptions prior to admission  Medication Sig Dispense Refill Last Dose  . aspirin 81 MG tablet Take 81 mg by mouth daily.   Past Week at Unknown time  . atenolol (TENORMIN) 50 MG tablet TAKE 1 TABLET BY MOUTH TWICE DAILY 60 tablet 7 07/04/2015 at 0600  . gabapentin (NEURONTIN) 300 MG capsule Take 300 mg by mouth 3 (three) times daily as needed.   07/03/2015 at 2100  . hydrochlorothiazide (HYDRODIURIL) 25 MG tablet TAKE 1 TABLET BY MOUTH EVERY DAY 30 tablet 11 07/04/2015 at 0600  . metFORMIN (GLUCOPHAGE) 500 MG tablet TAKE 4 TABLETS BY MOUTH EVERY EVENING. 120 tablet 11 07/03/2015 at 2030  . ciprofloxacin (CILOXAN) 0.3 % ophthalmic solution Place 1 drop into the right eye every 6 (six) hours. 5 mL 0   . COSENTYX SENSOREADY PEN 150 MG/ML SOAJ   10 Taking  . fish oil-omega-3 fatty acids 1000 MG capsule Take 2 g by mouth daily.   Taking  . folic acid (FOLVITE) 1 MG tablet Take 1 mg by mouth daily.   Taking  . Multiple Vitamin (MULTIVITAMIN) tablet Take 1 tablet by mouth daily.   Taking  . oxycodone  (OXY-IR) 5 MG capsule Take 5 mg by mouth 2 (two) times daily as needed.     . triamcinolone (NASACORT AQ) 55 MCG/ACT AERO nasal inhaler Place 2 sprays into the nose daily. 1 Inhaler 3 Taking  . vitamin E 400 UNIT capsule Take 400 Units by mouth daily.   Taking     Allergies  Allergen Reactions  . Amoxicillin   . Ephedrine Other (See Comments)    pain  . Hydrocodone-Acetaminophen     Other reaction(s): Other (See Comments) GI Upset  . Levsin [Hyoscyamine Sulfate] Other (See Comments)    Urinary retention  . Naprosyn [Naproxen] Other (See Comments)    GI upset   . Penicillins   . Sudafed [Pseudoephedrine Hcl]   . Vancomycin   . Other Rash    Paper tape  . Shellfish Allergy Rash     Past Medical History  Diagnosis Date  . Hypertension   . Abnormal liver function tests   . Psoriasis   . Hiatal hernia   . Psoriatic arthritis ([place])   . Sleep apnea   . Allergic state   . Arthritis     spine, hands, shoulder with previous cuff tear  . Colon polyp   . Diabetes mellitus ([place])     type 2 since 09/16/2011  . Elevated transaminase level   . GERD (gastroesophageal reflux disease)   . Hypercholesterolemia  Review of systems:      Physical Exam    Heart and lungs: Regular rate and rhythm without rub or gallop, lungs are bilaterally clear.    HEENT: Normocephalic atraumatic eyes are anicteric    Other:     Pertinant exam for procedure: Soft nontender nondistended bowel sounds positive normoactive.    Planned proceedures: EGD and indicated procedures. I have discussed the risks benefits and complications of procedures to include not limited to bleeding, infection, perforation and the risk of sedation and the patient wishes to proceed.    Lollie Sails, MD Gastroenterology 07/04/2015  10:11 AM

## 2015-07-06 ENCOUNTER — Encounter: Payer: Self-pay | Admitting: Gastroenterology

## 2015-08-01 ENCOUNTER — Other Ambulatory Visit: Payer: Self-pay | Admitting: Internal Medicine

## 2015-08-04 ENCOUNTER — Other Ambulatory Visit (INDEPENDENT_AMBULATORY_CARE_PROVIDER_SITE_OTHER): Payer: BLUE CROSS/BLUE SHIELD

## 2015-08-04 DIAGNOSIS — R7989 Other specified abnormal findings of blood chemistry: Secondary | ICD-10-CM | POA: Diagnosis not present

## 2015-08-04 DIAGNOSIS — I10 Essential (primary) hypertension: Secondary | ICD-10-CM | POA: Diagnosis not present

## 2015-08-04 DIAGNOSIS — R945 Abnormal results of liver function studies: Secondary | ICD-10-CM

## 2015-08-04 DIAGNOSIS — E119 Type 2 diabetes mellitus without complications: Secondary | ICD-10-CM | POA: Diagnosis not present

## 2015-08-04 LAB — LIPID PANEL
CHOL/HDL RATIO: 5
Cholesterol: 162 mg/dL (ref 0–200)
HDL: 35.9 mg/dL — AB (ref >39.00)
LDL CALC: 100 mg/dL — AB (ref 0–99)
NONHDL: 126.41
Triglycerides: 131 mg/dL (ref 0.0–149.0)
VLDL: 26.2 mg/dL (ref 0.0–40.0)

## 2015-08-04 LAB — HEPATIC FUNCTION PANEL
ALBUMIN: 4.1 g/dL (ref 3.5–5.2)
ALT: 18 U/L (ref 0–53)
AST: 15 U/L (ref 0–37)
Alkaline Phosphatase: 36 U/L — ABNORMAL LOW (ref 39–117)
BILIRUBIN TOTAL: 0.4 mg/dL (ref 0.2–1.2)
Bilirubin, Direct: 0.1 mg/dL (ref 0.0–0.3)
Total Protein: 7.4 g/dL (ref 6.0–8.3)

## 2015-08-04 LAB — BASIC METABOLIC PANEL
BUN: 13 mg/dL (ref 6–23)
CHLORIDE: 103 meq/L (ref 96–112)
CO2: 29 meq/L (ref 19–32)
CREATININE: 0.92 mg/dL (ref 0.40–1.50)
Calcium: 9.1 mg/dL (ref 8.4–10.5)
GFR: 87.26 mL/min (ref >60.00)
Glucose, Bld: 130 mg/dL — ABNORMAL HIGH (ref 70–99)
Potassium: 3.8 mEq/L (ref 3.5–5.1)
SODIUM: 139 meq/L (ref 135–145)

## 2015-08-04 LAB — HEMOGLOBIN A1C: HEMOGLOBIN A1C: 6.2 % (ref 4.6–6.5)

## 2015-08-04 LAB — MICROALBUMIN / CREATININE URINE RATIO
CREATININE, U: 173.5 mg/dL
MICROALB UR: 2.3 mg/dL — AB (ref 0.0–1.9)
MICROALB/CREAT RATIO: 1.3 mg/g (ref 0.0–30.0)

## 2015-08-08 ENCOUNTER — Encounter: Payer: Self-pay | Admitting: Internal Medicine

## 2015-08-11 ENCOUNTER — Encounter: Payer: Self-pay | Admitting: Internal Medicine

## 2015-08-11 ENCOUNTER — Ambulatory Visit (INDEPENDENT_AMBULATORY_CARE_PROVIDER_SITE_OTHER): Payer: BLUE CROSS/BLUE SHIELD | Admitting: Internal Medicine

## 2015-08-11 DIAGNOSIS — R945 Abnormal results of liver function studies: Secondary | ICD-10-CM

## 2015-08-11 DIAGNOSIS — I10 Essential (primary) hypertension: Secondary | ICD-10-CM | POA: Diagnosis not present

## 2015-08-11 DIAGNOSIS — E119 Type 2 diabetes mellitus without complications: Secondary | ICD-10-CM

## 2015-08-11 DIAGNOSIS — R7989 Other specified abnormal findings of blood chemistry: Secondary | ICD-10-CM | POA: Diagnosis not present

## 2015-08-11 DIAGNOSIS — M25512 Pain in left shoulder: Secondary | ICD-10-CM

## 2015-08-11 DIAGNOSIS — L4050 Arthropathic psoriasis, unspecified: Secondary | ICD-10-CM

## 2015-08-11 DIAGNOSIS — K219 Gastro-esophageal reflux disease without esophagitis: Secondary | ICD-10-CM

## 2015-08-11 DIAGNOSIS — D649 Anemia, unspecified: Secondary | ICD-10-CM

## 2015-08-11 MED ORDER — TRAMADOL HCL 50 MG PO TABS: 50.0000 mg | ORAL_TABLET | Freq: Two times a day (BID) | ORAL | 0 refills | Status: DC | PRN

## 2015-08-11 NOTE — Progress Notes (Signed)
Patient ID: Shane Mcknight, male   DOB: 05/11/1950, 65 y.o.   MRN: [id number]   Subjective:    Patient ID: Shane Mcknight, male    DOB: January 16, 1950, 65 y.o.   MRN: [id number]  HPI  Patient here for a scheduled follow up.  He is having increased pain in his left shoulder.  This extends up his neck and also down into his arm.  No significant pain with rotation of his head.  Increased pain with lifting his arm.  Limited rom.  Limited by pain.  Increasing.  Affecting his sleep.  States he needs something for pain.  Unable to take increased antiinflammatories.  Sees Dr Jefm Bryant for his arthritis.  Tries to stay active.  No cardiac symptoms with increased activity or exertion.  No sob.  No acid reflux.  No abdominal pain or cramping.  Bowel stable.     Past Medical History:  Diagnosis Date  . Abnormal liver function tests   . Allergic state   . Arthritis    spine, hands, shoulder with previous cuff tear  . Colon polyp   . Diabetes mellitus ([place])    type 2 since 09/16/2011  . Elevated transaminase level   . GERD (gastroesophageal reflux disease)   . Hiatal hernia   . Hypercholesterolemia   . Hypertension   . Psoriasis   . Psoriatic arthritis ([place])   . Sleep apnea    Past Surgical History:  Procedure Laterality Date  . ARTHROPLASTY     right thumb; left thumb  . BALLOON DILATION N/A 05/29/2015   Procedure: BALLOON DILATION;  Surgeon: Lollie Sails, MD;  Location: [facility] ENDOSCOPY;  Service: Endoscopy;  Laterality: N/A;  . BLEPHAROPLASTY    . CARPAL TUNNEL RELEASE     right  . CHOLECYSTECTOMY    . COLONOSCOPY    . CYST EXCISION  04/14/2015   tendon sheath cyst excision; right ring finger cyst removed and trigger finger realease  . ESOPHAGOGASTRODUODENOSCOPY    . ESOPHAGOGASTRODUODENOSCOPY (EGD) WITH PROPOFOL N/A 05/29/2015   Procedure: ESOPHAGOGASTRODUODENOSCOPY (EGD) WITH PROPOFOL;  Surgeon: Lollie Sails, MD;  Location: [facility] ENDOSCOPY;  Service: Endoscopy;  Laterality: N/A;  .  ESOPHAGOGASTRODUODENOSCOPY (EGD) WITH PROPOFOL N/A 07/04/2015   Procedure: ESOPHAGOGASTRODUODENOSCOPY (EGD) WITH PROPOFOL;  Surgeon: Lollie Sails, MD;  Location: [facility] ENDOSCOPY;  Service: Endoscopy;  Laterality: N/A;  . HERNIA REPAIR    . LUMBAR DISC SURGERY    . NASAL SINUS SURGERY    . ROTATOR CUFF REPAIR    . TONSILLECTOMY     Family History  Problem Relation Age of Onset  . Lung cancer Father   . Hypertension Mother    Social History   Social History  . Marital status: Married    Spouse name: N/A  . Number of children: N/A  . Years of education: N/A   Social History Main Topics  . Smoking status: Former Smoker    Quit date: 07/06/1982  . Smokeless tobacco: Never Used  . Alcohol use No  . Drug use: No  . Sexual activity: Not Asked   Other Topics Concern  . None   Social History Narrative  . None    Outpatient Encounter Prescriptions as of 08/11/2015  Medication Sig  . aspirin 81 MG tablet Take 81 mg by mouth daily.  Marland Kitchen atenolol (TENORMIN) 50 MG tablet TAKE 1 TABLET BY MOUTH TWICE DAILY  . COSENTYX SENSOREADY PEN 150 MG/ML SOAJ   . fish oil-omega-3 fatty acids 1000 MG capsule  Take 2 g by mouth daily.  . folic acid (FOLVITE) 1 MG tablet Take 1 mg by mouth daily.  Marland Kitchen gabapentin (NEURONTIN) 300 MG capsule Take 300 mg by mouth 3 (three) times daily as needed.  . hydrochlorothiazide (HYDRODIURIL) 25 MG tablet TAKE 1 TABLET BY MOUTH EVERY DAY  . losartan (COZAAR) 50 MG tablet TAKE 1 TABLET BY MOUTH EVERY DAY  . metFORMIN (GLUCOPHAGE) 500 MG tablet TAKE 4 TABLETS BY MOUTH EVERY EVENING. (Patient taking differently: TAKE 2 TABLETS TWO TIMES DAILY)  . Multiple Vitamin (MULTIVITAMIN) tablet Take 1 tablet by mouth daily.  . pantoprazole (PROTONIX) 40 MG tablet Take 40 mg by mouth daily.  . vitamin E 400 UNIT capsule Take 400 Units by mouth daily.  . traMADol (ULTRAM) 50 MG tablet Take 1 tablet (50 mg total) by mouth 2 (two) times daily as needed.  . [DISCONTINUED]  ciprofloxacin (CILOXAN) 0.3 % ophthalmic solution Place 1 drop into the right eye every 6 (six) hours.  . [DISCONTINUED] oxycodone (OXY-IR) 5 MG capsule Take 5 mg by mouth 2 (two) times daily as needed.  . [DISCONTINUED] triamcinolone (NASACORT AQ) 55 MCG/ACT AERO nasal inhaler Place 2 sprays into the nose daily.   No facility-administered encounter medications on file as of 08/11/2015.     Review of Systems  Constitutional: Negative for appetite change and unexpected weight change.  HENT: Negative for congestion and sinus pressure.   Respiratory: Negative for cough, chest tightness and shortness of breath.   Cardiovascular: Negative for chest pain, palpitations and leg swelling.  Gastrointestinal: Negative for abdominal pain, diarrhea, nausea and vomiting.  Genitourinary: Negative for difficulty urinating and dysuria.  Musculoskeletal:       Increased neck and left shoulder pain.  Increased arm pain.  Limited rom.  Affecting his sleep.    Neurological: Negative for dizziness, light-headedness and headaches.  Psychiatric/Behavioral: Negative for agitation and dysphoric mood.       Objective:    Physical Exam  Constitutional: He appears well-developed and well-nourished. No distress.  HENT:  Nose: Nose normal.  Mouth/Throat: Oropharynx is clear and moist.  Neck: Neck supple. No thyromegaly present.  Cardiovascular: Normal rate and regular rhythm.   Pulmonary/Chest: Effort normal and breath sounds normal. No respiratory distress.  Abdominal: Soft. Bowel sounds are normal. There is no tenderness.  Musculoskeletal: He exhibits no edema or tenderness.  Increased pain with attempts at full extension and abduction of left upper extremity.  Limited rom.  No significant pain with rotation of his head.    Lymphadenopathy:    He has no cervical adenopathy.  Skin: No rash noted. No erythema.  Psychiatric: He has a normal mood and affect. His behavior is normal.    BP 126/70 (BP Location:  Left Arm, Patient Position: Sitting, Cuff Size: Large)   Pulse 72   Temp 98.4 F (36.9 C) (Oral)   Resp 16   Wt 196 lb (88.9 kg)   BMI 29.80 kg/m  Wt Readings from Last 3 Encounters:  08/11/15 196 lb (88.9 kg)  07/04/15 189 lb (85.7 kg)  05/18/15 190 lb (86.2 kg)     Lab Results  Component Value Date   WBC 7.4 05/18/2015   HGB 12.6 (L) 05/18/2015   HCT 36.1 (L) 05/18/2015   PLT 173 05/18/2015   GLUCOSE 130 (H) 08/04/2015   CHOL 162 08/04/2015   TRIG 131.0 08/04/2015   HDL 35.90 (L) 08/04/2015   LDLCALC 100 (H) 08/04/2015   ALT 18 08/04/2015   AST  15 08/04/2015   NA 139 08/04/2015   K 3.8 08/04/2015   CL 103 08/04/2015   CREATININE 0.92 08/04/2015   BUN 13 08/04/2015   CO2 29 08/04/2015   TSH 1.93 04/07/2015   PSA 1.50 04/07/2015   HGBA1C 6.2 08/04/2015   MICROALBUR 2.3 (H) 08/04/2015      Assessment & Plan:   Problem List Items Addressed This Visit    Abnormal liver function tests    Last liver panel wnl.  Follow.        Relevant Orders   Hepatic function panel   Anemia    Has seen GI.  Recent hgb 12.6.  Follow.        Relevant Orders   CBC with Differential/Platelet   Vitamin B12   Ferritin   IBC panel   Diabetes mellitus ([place])    Discussed diet and exercise.  His sugars have been doing well.  Follow metabolic panel and [id number].       Relevant Orders   Lipid panel   Basic metabolic panel   Hemoglobin A1c   GERD (gastroesophageal reflux disease)    EGD as outlined.  On protonix.  Continue f/u with GI.       Relevant Medications   pantoprazole (PROTONIX) 40 MG tablet   Hypertension    Blood pressure under good control.  Continue same medication regimen.  Follow pressures.  Follow metabolic panel.        Left shoulder pain    Increased neck, shoulder and arm pain as outlined.  Limited rom and increased pain with attempts at full extension and abduction.  Radiates down arm.  Question if neck involvement.  No significant pain with rotation of his  head.  Needs something stronger for pain.  Unable to take antiinflammatories.  Medrol dose pack as directed.  Discussed possible risk and side effects of the medication.  Discussed GI side effects.  Will monitor.  Tramadol as directed.  F/u with rheumatology.  Hold on xray.        Psoriatic arthritis ([place])    Followed by rheumatology.  Sees Dr Jefm Bryant.  On cosentyx         Other Visit Diagnoses   None.      Einar Pheasant, MD

## 2015-08-13 ENCOUNTER — Encounter: Payer: Self-pay | Admitting: Internal Medicine

## 2015-08-13 DIAGNOSIS — D649 Anemia, unspecified: Secondary | ICD-10-CM | POA: Insufficient documentation

## 2015-08-13 DIAGNOSIS — M25512 Pain in left shoulder: Secondary | ICD-10-CM | POA: Insufficient documentation

## 2015-08-13 NOTE — Assessment & Plan Note (Signed)
Discussed diet and exercise.  His sugars have been doing well.  Follow metabolic panel and [id number].

## 2015-08-13 NOTE — Assessment & Plan Note (Signed)
Increased neck, shoulder and arm pain as outlined.  Limited rom and increased pain with attempts at full extension and abduction.  Radiates down arm.  Question if neck involvement.  No significant pain with rotation of his head.  Needs something stronger for pain.  Unable to take antiinflammatories.  Medrol dose pack as directed.  Discussed possible risk and side effects of the medication.  Discussed GI side effects.  Will monitor.  Tramadol as directed.  F/u with rheumatology.  Hold on xray.

## 2015-08-13 NOTE — Assessment & Plan Note (Signed)
Blood pressure under good control.  Continue same medication regimen.  Follow pressures.  Follow metabolic panel.   

## 2015-08-13 NOTE — Assessment & Plan Note (Signed)
Has seen GI.  Recent hgb 12.6.  Follow.

## 2015-08-13 NOTE — Assessment & Plan Note (Signed)
Last liver panel wnl.  Follow.  

## 2015-08-13 NOTE — Assessment & Plan Note (Signed)
Followed by rheumatology.  Sees Dr Jefm Bryant.  On cosentyx

## 2015-08-13 NOTE — Assessment & Plan Note (Signed)
EGD as outlined.  On protonix.  Continue f/u with GI.

## 2015-08-22 ENCOUNTER — Other Ambulatory Visit: Payer: Self-pay | Admitting: Rheumatology

## 2015-08-22 DIAGNOSIS — M25511 Pain in right shoulder: Secondary | ICD-10-CM

## 2015-08-31 ENCOUNTER — Other Ambulatory Visit: Payer: Self-pay | Admitting: Rheumatology

## 2015-08-31 DIAGNOSIS — M25512 Pain in left shoulder: Secondary | ICD-10-CM

## 2015-09-02 ENCOUNTER — Ambulatory Visit
Admission: RE | Admit: 2015-09-02 | Discharge: 2015-09-02 | Disposition: A | Discharge status: Home / Self Care | Payer: BLUE CROSS/BLUE SHIELD | Source: Ambulatory Visit | Attending: Rheumatology | Admitting: Rheumatology

## 2015-09-02 DIAGNOSIS — R937 Abnormal findings on diagnostic imaging of other parts of musculoskeletal system: Secondary | ICD-10-CM | POA: Insufficient documentation

## 2015-09-02 DIAGNOSIS — M62522 Muscle wasting and atrophy, not elsewhere classified, left upper arm: Secondary | ICD-10-CM | POA: Diagnosis not present

## 2015-09-02 DIAGNOSIS — G8929 Other chronic pain: Secondary | ICD-10-CM | POA: Insufficient documentation

## 2015-09-02 DIAGNOSIS — M25512 Pain in left shoulder: Secondary | ICD-10-CM | POA: Insufficient documentation

## 2015-09-20 DIAGNOSIS — M75122 Complete rotator cuff tear or rupture of left shoulder, not specified as traumatic: Secondary | ICD-10-CM | POA: Insufficient documentation

## 2015-10-09 ENCOUNTER — Telehealth: Payer: Self-pay | Admitting: Internal Medicine

## 2015-10-09 NOTE — Telephone Encounter (Signed)
Gave verbal order for the 180 tablets with no refills.

## 2015-10-09 NOTE — Telephone Encounter (Signed)
Pt called about having a problem with one of his Rx [facility] was unable to get his medication not able to get the medication.  Pt needs the Rx called to [facility] in [place]  Medication is atenolol (TENORMIN) 50 MG tablet  Call pt @ [phone]. Thank you!

## 2015-11-15 DIAGNOSIS — K226 Gastro-esophageal laceration-hemorrhage syndrome: Secondary | ICD-10-CM

## 2015-11-15 HISTORY — DX: Gastro-esophageal laceration-hemorrhage syndrome: K22.6

## 2015-11-17 LAB — HM DIABETES EYE EXAM

## 2015-11-21 ENCOUNTER — Ambulatory Visit (INDEPENDENT_AMBULATORY_CARE_PROVIDER_SITE_OTHER): Payer: BLUE CROSS/BLUE SHIELD | Admitting: Family

## 2015-11-21 ENCOUNTER — Encounter: Payer: Self-pay | Admitting: Family

## 2015-11-21 VITALS — BP 126/82 | HR 79 | Temp 98.1°F | Wt 199.6 lb

## 2015-11-21 DIAGNOSIS — J209 Acute bronchitis, unspecified: Secondary | ICD-10-CM | POA: Diagnosis not present

## 2015-11-21 MED ORDER — HYDROCODONE-HOMATROPINE 5-1.5 MG/5ML PO SYRP
5.0000 mL | ORAL_SOLUTION | Freq: Every evening | ORAL | 0 refills | Status: DC | PRN
Start: 2015-11-21 — End: 2016-02-20

## 2015-11-21 NOTE — Progress Notes (Signed)
Subjective:    Patient ID: Shane Mcknight, male    DOB: 1950-06-14, 65 y.o.   MRN: [id number]  CC: Shane Mcknight is a 65 y.o. male who presents today for an acute visit.    HPI: Patient here for acute visit with chief complaint of dry cough, congestion for one month, unchanged. No wheezing, SOB. Cough triggered if gets warm such as warm room. Tried tussionex  OTC without relief.    history of obstructive sleep apnea, hypertension.    HISTORY:  Past Medical History:  Diagnosis Date  . Abnormal liver function tests   . Allergic state   . Arthritis    spine, hands, shoulder with previous cuff tear  . Colon polyp   . Diabetes mellitus ([place])    type 2 since 09/16/2011  . Elevated transaminase level   . GERD (gastroesophageal reflux disease)   . Hiatal hernia   . Hypercholesterolemia   . Hypertension   . Psoriasis   . Psoriatic arthritis ([place])   . Sleep apnea    Past Surgical History:  Procedure Laterality Date  . ARTHROPLASTY     right thumb; left thumb  . BALLOON DILATION N/A 05/29/2015   Procedure: BALLOON DILATION;  Surgeon: Lollie Sails, MD;  Location: [facility] ENDOSCOPY;  Service: Endoscopy;  Laterality: N/A;  . BLEPHAROPLASTY    . CARPAL TUNNEL RELEASE     right  . CHOLECYSTECTOMY    . COLONOSCOPY    . CYST EXCISION  04/14/2015   tendon sheath cyst excision; right ring finger cyst removed and trigger finger realease  . ESOPHAGOGASTRODUODENOSCOPY    . ESOPHAGOGASTRODUODENOSCOPY (EGD) WITH PROPOFOL N/A 05/29/2015   Procedure: ESOPHAGOGASTRODUODENOSCOPY (EGD) WITH PROPOFOL;  Surgeon: Lollie Sails, MD;  Location[facility] ENDOSCOPY;  Service: Endoscopy;  Laterality: N/A;  . ESOPHAGOGASTRODUODENOSCOPY (EGD) WITH PROPOFOL N/A 07/04/2015   Procedure: ESOPHAGOGASTRODUODENOSCOPY (EGD) WITH PROPOFOL;  Surgeon: Lollie Sails, MD;  Location: [facility] ENDOSCOPY;  Service: Endoscopy;  Laterality: N/A;  . HERNIA REPAIR    . LUMBAR DISC SURGERY    . NASAL SINUS SURGERY    . ROTATOR  CUFF REPAIR    . TONSILLECTOMY     Family History  Problem Relation Age of Onset  . Lung cancer Father   . Hypertension Mother     Allergies: Amoxicillin; Ephedrine; Levsin [hyoscyamine sulfate]; Naprosyn [naproxen]; Penicillins; Sudafed [pseudoephedrine hcl]; Vancomycin; Other; and Shellfish allergy Current Outpatient Prescriptions on File Prior to Visit  Medication Sig Dispense Refill  . aspirin 81 MG tablet Take 81 mg by mouth daily.    Marland Kitchen atenolol (TENORMIN) 50 MG tablet TAKE 1 TABLET BY MOUTH TWICE DAILY 60 tablet 7  . COSENTYX SENSOREADY PEN 150 MG/ML SOAJ   10  . fish oil-omega-3 fatty acids 1000 MG capsule Take 2 g by mouth daily.    . folic acid (FOLVITE) 1 MG tablet Take 1 mg by mouth daily.    Marland Kitchen gabapentin (NEURONTIN) 300 MG capsule Take 300 mg by mouth 3 (three) times daily as needed.    . hydrochlorothiazide (HYDRODIURIL) 25 MG tablet TAKE 1 TABLET BY MOUTH EVERY DAY 30 tablet 11  . losartan (COZAAR) 50 MG tablet TAKE 1 TABLET BY MOUTH EVERY DAY 90 tablet 1  . metFORMIN (GLUCOPHAGE) 500 MG tablet TAKE 4 TABLETS BY MOUTH EVERY EVENING. (Patient taking differently: TAKE 2 TABLETS TWO TIMES DAILY) 120 tablet 11  . Multiple Vitamin (MULTIVITAMIN) tablet Take 1 tablet by mouth daily.    . pantoprazole (PROTONIX)  40 MG tablet Take 40 mg by mouth daily.    . traMADol (ULTRAM) 50 MG tablet Take 1 tablet (50 mg total) by mouth 2 (two) times daily as needed. 40 tablet 0  . vitamin E 400 UNIT capsule Take 400 Units by mouth daily.     No current facility-administered medications on file prior to visit.     Social History  Substance Use Topics  . Smoking status: Former Smoker    Quit date: 07/06/1982  . Smokeless tobacco: Never Used  . Alcohol use No    Review of Systems  Constitutional: Negative for chills and fever.  HENT: Positive for congestion. Negative for ear pain, rhinorrhea, sinus pain, sinus pressure and sore throat.   Respiratory: Positive for cough. Negative for  shortness of breath and wheezing.   Cardiovascular: Negative for chest pain and palpitations.  Gastrointestinal: Negative for nausea and vomiting.      Objective:    BP 126/82   Pulse 79   Temp 98.1 F (36.7 C) (Oral)   Wt 199 lb 9.6 oz (90.5 kg)   SpO2 97%   BMI 30.35 kg/m    Physical Exam  Constitutional: Vital signs are normal. He appears well-developed and well-nourished.  HENT:  Head: Normocephalic and atraumatic.  Right Ear: Hearing, tympanic membrane, external ear and ear canal normal. No drainage, swelling or tenderness. Tympanic membrane is not injected, not erythematous and not bulging. No middle ear effusion. No decreased hearing is noted.  Left Ear: Hearing, tympanic membrane, external ear and ear canal normal. No drainage, swelling or tenderness. Tympanic membrane is not injected, not erythematous and not bulging.  No middle ear effusion. No decreased hearing is noted.  Nose: Nose normal. Right sinus exhibits no maxillary sinus tenderness and no frontal sinus tenderness. Left sinus exhibits no maxillary sinus tenderness and no frontal sinus tenderness.  Mouth/Throat: Uvula is midline, oropharynx is clear and moist and mucous membranes are normal. No oropharyngeal exudate, posterior oropharyngeal edema, posterior oropharyngeal erythema or tonsillar abscesses.  Eyes: Conjunctivae are normal.  Cardiovascular: Regular rhythm and normal heart sounds.   Pulmonary/Chest: Effort normal and breath sounds normal. No respiratory distress. He has no wheezes. He has no rhonchi. He has no rales.  Lymphadenopathy:       Head (right side): No submental, no submandibular, no tonsillar, no preauricular, no posterior auricular and no occipital adenopathy present.       Head (left side): No submental, no submandibular, no tonsillar, no preauricular, no posterior auricular and no occipital adenopathy present.    He has no cervical adenopathy.  Neurological: He is alert.  Skin: Skin is warm  and dry.  Psychiatric: He has a normal mood and affect. His speech is normal and behavior is normal.  Vitals reviewed.      Assessment & Plan:  1. Acute bronchitis, unspecified organism Afebrile. No acute respiratory distress. Patient and I jointly agreed on continuing conservative management with cough syrup at bedtime. He will be vigilant for any AE of urinary retention. Patient will let me know if he is not better at that time we will consider antibiotics, chest x-ray at that time.  - HYDROcodone-homatropine (HYCODAN) 5-1.5 MG/5ML syrup; Take 5 mLs by mouth at bedtime as needed for cough.  Dispense: 30 mL; Refill: 0     I am having Mr. Moura maintain his aspirin, folic acid, multivitamin, vitamin E, fish oil-omega-3 fatty acids, gabapentin, atenolol, COSENTYX SENSOREADY PEN, hydrochlorothiazide, metFORMIN, losartan, pantoprazole, and traMADol.   No  orders of the defined types were placed in this encounter.   Return precautions given.   Risks, benefits, and alternatives of the medications and treatment plan prescribed today were discussed, and patient expressed understanding.   Education regarding symptom management and diagnosis given to patient on AVS.  Continue to follow with Einar Pheasant, MD for routine health maintenance.   Tareek O Stormes and I agreed with plan.   Mable Paris, FNP

## 2015-11-21 NOTE — Progress Notes (Signed)
Pre visit review using our clinic review tool, if applicable. No additional management support is needed unless otherwise documented below in the visit note. 

## 2015-11-21 NOTE — Patient Instructions (Signed)
Please take cough medication at night only as needed. As we discussed, I do not recommend dosing throughout the day as coughing is a protective mechanism . It also helps to break up thick mucous.  Do not take cough suppressants with alcohol as can lead to trouble breathing. Advise caution if taking cough suppressant and operating machinery ( i.e driving a car) as you may feel very tired.    Increase intake of clear fluids. Congestion is best treated by hydration, when mucus is wetter, it is thinner, less sticky, and easier to expel from the body, either through coughing up drainage, or by blowing your nose.   Get plenty of rest.   Use saline nasal drops and blow your nose frequently. Run a humidifier at night and elevate the head of the bed. Vicks Vapor rub will help with congestion and cough. Steam showers and sinus massage for congestion.   Use Acetaminophen or Ibuprofen as needed for fever or pain. Avoid second hand smoke. Even the smallest exposure will worsen symptoms.    You can also try a teaspoon of honey to see if this will help reduce cough. Throat lozenges can sometimes be beneficial as well.    This illness will typically last 7 - 10 days.   Please follow up with our clinic if you develop a fever greater than 101 F, symptoms worsen, or do not resolve in the next week.    

## 2015-11-22 ENCOUNTER — Encounter: Payer: Self-pay | Admitting: Internal Medicine

## 2015-11-23 ENCOUNTER — Other Ambulatory Visit: Payer: Self-pay | Admitting: Family

## 2015-11-23 ENCOUNTER — Ambulatory Visit (INDEPENDENT_AMBULATORY_CARE_PROVIDER_SITE_OTHER): Payer: BLUE CROSS/BLUE SHIELD

## 2015-11-23 ENCOUNTER — Telehealth: Payer: Self-pay | Admitting: Internal Medicine

## 2015-11-23 DIAGNOSIS — R059 Cough, unspecified: Secondary | ICD-10-CM

## 2015-11-23 DIAGNOSIS — R05 Cough: Secondary | ICD-10-CM

## 2015-11-23 MED ORDER — AZITHROMYCIN 250 MG PO TABS: ORAL_TABLET | ORAL | 0 refills | Status: DC

## 2015-11-23 NOTE — Telephone Encounter (Signed)
rx sent. Please advise to call us if not better on abx and I will order cxr

## 2015-11-23 NOTE — Telephone Encounter (Signed)
Left a detailed message for patient that antibiotic was sent in and CXR ordered.

## 2015-11-23 NOTE — Telephone Encounter (Signed)
Pt called and stated that he was itching all day after taking HYDROcodone-homatropine (HYCODAN) 5-1.5 MG/5ML syrup. Please advise, thank you!  Pt stated that he would like to have a chest xray since he is not feeling any better.  Call pt @ [phone]

## 2015-11-23 NOTE — Telephone Encounter (Signed)
Shane Mcknight can you provide input also since patient saw you? thanks

## 2015-11-23 NOTE — Telephone Encounter (Signed)
Last time took the cough syrup two nights ago,this is the first time he has had a problem with the medication.  1/2 half hour later it started making him itch and he was up the rest of the night with coughing and itching. Chest tightness and congestion continues to get worse now. Little drainage, little green tint to the color. No fever.  Doesn't feel good.  Stated that this is similar to his pneumonia back a few years ago.  Concerns wants to know if he needs an xray or an appt for follow up?

## 2015-11-23 NOTE — Telephone Encounter (Signed)
Saw Shane Mcknight on 11/21/15.  She had put in her note, consider cxr and abx if no better.  If no better and getting worse, agree with reevaluation and cxr if needed.

## 2015-12-03 ENCOUNTER — Ambulatory Visit: Admit: 2015-12-03 | Payer: BLUE CROSS/BLUE SHIELD | Admitting: Gastroenterology

## 2015-12-03 ENCOUNTER — Encounter
Admission: EM | Disposition: A | Discharge status: Home / Self Care | Payer: Self-pay | Source: Home / Self Care | Attending: Internal Medicine

## 2015-12-03 ENCOUNTER — Inpatient Hospital Stay
Admission: EM | Admit: 2015-12-03 | Discharge: 2015-12-08 | DRG: 393 | Disposition: A | Discharge status: Home / Self Care | Payer: BLUE CROSS/BLUE SHIELD | Attending: Internal Medicine | Admitting: Internal Medicine

## 2015-12-03 ENCOUNTER — Emergency Department: Payer: BLUE CROSS/BLUE SHIELD | Admitting: Anesthesiology

## 2015-12-03 ENCOUNTER — Encounter: Payer: Self-pay | Admitting: Emergency Medicine

## 2015-12-03 ENCOUNTER — Emergency Department: Payer: BLUE CROSS/BLUE SHIELD

## 2015-12-03 ENCOUNTER — Inpatient Hospital Stay: Payer: BLUE CROSS/BLUE SHIELD

## 2015-12-03 DIAGNOSIS — Z87891 Personal history of nicotine dependence: Secondary | ICD-10-CM | POA: Diagnosis not present

## 2015-12-03 DIAGNOSIS — I959 Hypotension, unspecified: Secondary | ICD-10-CM | POA: Diagnosis present

## 2015-12-03 DIAGNOSIS — E876 Hypokalemia: Secondary | ICD-10-CM | POA: Diagnosis present

## 2015-12-03 DIAGNOSIS — J9690 Respiratory failure, unspecified, unspecified whether with hypoxia or hypercapnia: Secondary | ICD-10-CM | POA: Diagnosis present

## 2015-12-03 DIAGNOSIS — Z8719 Personal history of other diseases of the digestive system: Secondary | ICD-10-CM

## 2015-12-03 DIAGNOSIS — X58XXXA Exposure to other specified factors, initial encounter: Secondary | ICD-10-CM | POA: Diagnosis present

## 2015-12-03 DIAGNOSIS — K222 Esophageal obstruction: Secondary | ICD-10-CM

## 2015-12-03 DIAGNOSIS — E785 Hyperlipidemia, unspecified: Secondary | ICD-10-CM | POA: Diagnosis present

## 2015-12-03 DIAGNOSIS — E872 Acidosis: Secondary | ICD-10-CM | POA: Diagnosis present

## 2015-12-03 DIAGNOSIS — T18128A Food in esophagus causing other injury, initial encounter: Principal | ICD-10-CM | POA: Diagnosis present

## 2015-12-03 DIAGNOSIS — Z452 Encounter for adjustment and management of vascular access device: Secondary | ICD-10-CM

## 2015-12-03 DIAGNOSIS — W44F3XA Food entering into or through a natural orifice, initial encounter: Secondary | ICD-10-CM | POA: Diagnosis present

## 2015-12-03 DIAGNOSIS — R1310 Dysphagia, unspecified: Secondary | ICD-10-CM | POA: Diagnosis not present

## 2015-12-03 DIAGNOSIS — K226 Gastro-esophageal laceration-hemorrhage syndrome: Secondary | ICD-10-CM | POA: Diagnosis present

## 2015-12-03 DIAGNOSIS — K922 Gastrointestinal hemorrhage, unspecified: Secondary | ICD-10-CM | POA: Diagnosis not present

## 2015-12-03 DIAGNOSIS — R0600 Dyspnea, unspecified: Secondary | ICD-10-CM

## 2015-12-03 DIAGNOSIS — Z01818 Encounter for other preprocedural examination: Secondary | ICD-10-CM

## 2015-12-03 DIAGNOSIS — E11649 Type 2 diabetes mellitus with hypoglycemia without coma: Secondary | ICD-10-CM | POA: Diagnosis present

## 2015-12-03 HISTORY — PX: FOREIGN BODY REMOVAL: SHX962

## 2015-12-03 LAB — COMPREHENSIVE METABOLIC PANEL
ALBUMIN: 4.5 g/dL (ref 3.5–5.0)
ALT: 34 U/L (ref 17–63)
ANION GAP: 12 (ref 5–15)
AST: 33 U/L (ref 15–41)
Alkaline Phosphatase: 44 U/L (ref 38–126)
BILIRUBIN TOTAL: 0.8 mg/dL (ref 0.3–1.2)
BUN: 11 mg/dL (ref 6–20)
CHLORIDE: 104 mmol/L (ref 101–111)
CO2: 24 mmol/L (ref 22–32)
Calcium: 9.8 mg/dL (ref 8.9–10.3)
Creatinine, Ser: 0.89 mg/dL (ref 0.61–1.24)
GFR calc Af Amer: >60 mL/min (ref >60)
GFR calc non Af Amer: >60 mL/min (ref >60)
GLUCOSE: 127 mg/dL — AB (ref 65–99)
POTASSIUM: 3.4 mmol/L — AB (ref 3.5–5.1)
SODIUM: 140 mmol/L (ref 135–145)
TOTAL PROTEIN: 7.9 g/dL (ref 6.5–8.1)

## 2015-12-03 LAB — CBC WITH DIFFERENTIAL/PLATELET
BASOS ABS: 0 10*3/uL (ref 0–0.1)
BASOS PCT: 0 %
EOS ABS: 0.5 10*3/uL (ref 0–0.7)
Eosinophils Relative: 5 %
HEMATOCRIT: 41.2 % (ref 40.0–52.0)
Hemoglobin: 14.3 g/dL (ref 13.0–18.0)
Lymphocytes Relative: 16 %
Lymphs Abs: 1.5 10*3/uL (ref 1.0–3.6)
MCH: 29.4 pg (ref 26.0–34.0)
MCHC: 34.6 g/dL (ref 32.0–36.0)
MCV: 84.9 fL (ref 80.0–100.0)
MONO ABS: 0.6 10*3/uL (ref 0.2–1.0)
MONOS PCT: 7 %
NEUTROS ABS: 6.6 10*3/uL — AB (ref 1.4–6.5)
NEUTROS PCT: 72 %
Platelets: 206 10*3/uL (ref 150–440)
RBC: 4.85 MIL/uL (ref 4.40–5.90)
RDW: 15 % — AB (ref 11.5–14.5)
WBC: 9.2 10*3/uL (ref 3.8–10.6)

## 2015-12-03 LAB — MRSA PCR SCREENING: MRSA by PCR: NEGATIVE

## 2015-12-03 LAB — BASIC METABOLIC PANEL
Anion gap: 11 (ref 5–15)
BUN: 11 mg/dL (ref 6–20)
CO2: 25 mmol/L (ref 22–32)
Calcium: 9.1 mg/dL (ref 8.9–10.3)
Chloride: 104 mmol/L (ref 101–111)
Creatinine, Ser: 0.95 mg/dL (ref 0.61–1.24)
GFR calc Af Amer: >60 mL/min (ref >60)
GFR calc non Af Amer: >60 mL/min (ref >60)
Glucose, Bld: 132 mg/dL — ABNORMAL HIGH (ref 65–99)
Potassium: 3.2 mmol/L — ABNORMAL LOW (ref 3.5–5.1)
Sodium: 140 mmol/L (ref 135–145)

## 2015-12-03 LAB — MAGNESIUM: Magnesium: 1.4 mg/dL — ABNORMAL LOW (ref 1.7–2.4)

## 2015-12-03 LAB — BLOOD GAS, ARTERIAL
ACID-BASE EXCESS: 2.3 mmol/L — AB (ref 0.0–2.0)
BICARBONATE: 26.5 mmol/L (ref 20.0–28.0)
FIO2: 0.4
MECHANICAL RATE: 18
MECHVT: 550 mL
O2 Saturation: 97.3 %
PH ART: 7.44 (ref 7.350–7.450)
Patient temperature: 37
pCO2 arterial: 39 mmHg (ref 32.0–48.0)
pO2, Arterial: 90 mmHg (ref 83.0–108.0)

## 2015-12-03 LAB — PROTIME-INR
INR: 1.15
Prothrombin Time: 14.8 seconds (ref 11.4–15.2)

## 2015-12-03 LAB — CBC
HEMATOCRIT: 36.7 % — AB (ref 40.0–52.0)
HEMOGLOBIN: 13 g/dL (ref 13.0–18.0)
MCH: 30 pg (ref 26.0–34.0)
MCHC: 35.3 g/dL (ref 32.0–36.0)
MCV: 85 fL (ref 80.0–100.0)
Platelets: 193 10*3/uL (ref 150–440)
RBC: 4.32 MIL/uL — AB (ref 4.40–5.90)
RDW: 14.8 % — ABNORMAL HIGH (ref 11.5–14.5)
WBC: 11.2 10*3/uL — ABNORMAL HIGH (ref 3.8–10.6)

## 2015-12-03 LAB — PHOSPHORUS: Phosphorus: 4.1 mg/dL (ref 2.5–4.6)

## 2015-12-03 LAB — PROCALCITONIN: Procalcitonin: <0.1 ng/mL

## 2015-12-03 LAB — TROPONIN I: Troponin I: <0.03 ng/mL (ref <0.03)

## 2015-12-03 LAB — LACTIC ACID, PLASMA: Lactic Acid, Venous: 1.8 mmol/L (ref 0.5–1.9)

## 2015-12-03 LAB — TRIGLYCERIDES: TRIGLYCERIDES: 181 mg/dL — AB (ref <150)

## 2015-12-03 SURGERY — REMOVAL, FOREIGN BODY
Anesthesia: General

## 2015-12-03 MED ORDER — MAGNESIUM SULFATE 50 % IJ SOLN: 2.0000 g | Freq: Once | INTRAMUSCULAR | Status: DC

## 2015-12-03 MED ORDER — SODIUM CHLORIDE 0.9 % IV SOLN
INTRAVENOUS | Status: DC
  Administered 2015-12-03: 20:00:00 via INTRAVENOUS

## 2015-12-03 MED ORDER — FENTANYL 2500MCG IN NS 250ML (10MCG/ML) PREMIX INFUSION
25.0000 ug/h | INTRAVENOUS | Status: DC
  Administered 2015-12-03 – 2015-12-04 (×2): 50 ug/h via INTRAVENOUS
  Filled 2015-12-03 (×2): qty 250

## 2015-12-03 MED ORDER — PANTOPRAZOLE SODIUM 40 MG IV SOLR
80.0000 mg | Freq: Once | INTRAVENOUS | Status: AC
  Administered 2015-12-03: 80 mg via INTRAVENOUS
  Filled 2015-12-03: qty 80

## 2015-12-03 MED ORDER — MIDAZOLAM HCL 2 MG/2ML IJ SOLN
2.0000 mg | Freq: Once | INTRAMUSCULAR | Status: AC
  Administered 2015-12-03: 2 mg via INTRAVENOUS
  Filled 2015-12-03: qty 2

## 2015-12-03 MED ORDER — KCL IN DEXTROSE-NACL 40-5-0.45 MEQ/L-%-% IV SOLN
Freq: Once | INTRAVENOUS | Status: AC
  Administered 2015-12-04: 01:00:00 via INTRAVENOUS
  Filled 2015-12-03: qty 1000

## 2015-12-03 MED ORDER — ALBUTEROL SULFATE (2.5 MG/3ML) 0.083% IN NEBU
2.5000 mg | INHALATION_SOLUTION | RESPIRATORY_TRACT | Status: DC | PRN
Start: 2015-12-03 — End: 2015-12-08

## 2015-12-03 MED ORDER — MIDAZOLAM HCL 2 MG/2ML IJ SOLN
2.0000 mg | Freq: Once | INTRAMUSCULAR | Status: AC
  Administered 2015-12-03: 2 mg via INTRAVENOUS

## 2015-12-03 MED ORDER — FENTANYL CITRATE (PF) 100 MCG/2ML IJ SOLN
INTRAMUSCULAR | Status: DC | PRN
  Administered 2015-12-03: 100 ug via INTRAVENOUS

## 2015-12-03 MED ORDER — ONDANSETRON HCL 4 MG/2ML IJ SOLN
INTRAMUSCULAR | Status: DC | PRN
  Administered 2015-12-03: 4 mg via INTRAVENOUS

## 2015-12-03 MED ORDER — HYDRALAZINE HCL 20 MG/ML IJ SOLN
10.0000 mg | INTRAMUSCULAR | Status: DC | PRN
  Filled 2015-12-03: qty 1

## 2015-12-03 MED ORDER — SODIUM CHLORIDE 0.9 % IV SOLN
1.0000 g | Freq: Three times a day (TID) | INTRAVENOUS | Status: DC
  Administered 2015-12-03 – 2015-12-05 (×5): 1 g via INTRAVENOUS
  Filled 2015-12-03 (×7): qty 1

## 2015-12-03 MED ORDER — BISACODYL 10 MG RE SUPP: 10.0000 mg | Freq: Every day | RECTAL | Status: DC | PRN

## 2015-12-03 MED ORDER — ORAL CARE MOUTH RINSE: 15.0000 mL | Freq: Four times a day (QID) | OROMUCOSAL | Status: DC

## 2015-12-03 MED ORDER — ONDANSETRON HCL 4 MG/2ML IJ SOLN: 4.0000 mg | Freq: Once | INTRAMUSCULAR | Status: DC | PRN

## 2015-12-03 MED ORDER — ACETAMINOPHEN 325 MG PO TABS: 650.0000 mg | ORAL_TABLET | ORAL | Status: DC | PRN

## 2015-12-03 MED ORDER — CHLORHEXIDINE GLUCONATE 0.12% ORAL RINSE (MEDLINE KIT)
15.0000 mL | Freq: Two times a day (BID) | OROMUCOSAL | Status: DC
  Administered 2015-12-03 – 2015-12-04 (×2): 15 mL via OROMUCOSAL

## 2015-12-03 MED ORDER — ORAL CARE MOUTH RINSE
15.0000 mL | OROMUCOSAL | Status: DC
  Administered 2015-12-03 – 2015-12-04 (×8): 15 mL via OROMUCOSAL

## 2015-12-03 MED ORDER — SODIUM CHLORIDE 0.9 % IV SOLN
INTRAVENOUS | Status: DC
  Administered 2015-12-03: 19:00:00 via INTRAVENOUS

## 2015-12-03 MED ORDER — ROCURONIUM BROMIDE 100 MG/10ML IV SOLN
INTRAVENOUS | Status: DC | PRN
  Administered 2015-12-03: 50 mg via INTRAVENOUS

## 2015-12-03 MED ORDER — ATENOLOL 50 MG PO TABS: 50.0000 mg | ORAL_TABLET | Freq: Two times a day (BID) | ORAL | Status: DC

## 2015-12-03 MED ORDER — MIDAZOLAM HCL 2 MG/2ML IJ SOLN
INTRAMUSCULAR | Status: AC
  Administered 2015-12-03: 2 mg via INTRAVENOUS
  Filled 2015-12-03: qty 2

## 2015-12-03 MED ORDER — LIDOCAINE HCL (CARDIAC) 20 MG/ML IV SOLN
INTRAVENOUS | Status: DC | PRN
  Administered 2015-12-03: 100 mg via INTRAVENOUS

## 2015-12-03 MED ORDER — SODIUM CHLORIDE 0.9 % IV BOLUS (SEPSIS)
1000.0000 mL | INTRAVENOUS | Status: AC
  Administered 2015-12-03 (×2): 1000 mL via INTRAVENOUS

## 2015-12-03 MED ORDER — ACETAMINOPHEN 650 MG RE SUPP: 650.0000 mg | Freq: Four times a day (QID) | RECTAL | Status: DC | PRN

## 2015-12-03 MED ORDER — LOSARTAN POTASSIUM 50 MG PO TABS: 50.0000 mg | ORAL_TABLET | Freq: Every day | ORAL | Status: DC

## 2015-12-03 MED ORDER — MIDAZOLAM HCL 2 MG/2ML IJ SOLN
INTRAMUSCULAR | Status: DC | PRN
  Administered 2015-12-03: 2 mg via INTRAVENOUS

## 2015-12-03 MED ORDER — FENTANYL CITRATE (PF) 100 MCG/2ML IJ SOLN
100.0000 ug | Freq: Once | INTRAMUSCULAR | Status: AC
  Administered 2015-12-03: 100 ug via INTRAVENOUS
  Filled 2015-12-03: qty 2

## 2015-12-03 MED ORDER — SUCCINYLCHOLINE CHLORIDE 20 MG/ML IJ SOLN
INTRAMUSCULAR | Status: DC | PRN
  Administered 2015-12-03: 100 mg via INTRAVENOUS

## 2015-12-03 MED ORDER — SODIUM CHLORIDE 0.9 % IV SOLN
8.0000 mg/h | INTRAVENOUS | Status: AC
  Administered 2015-12-03 – 2015-12-06 (×7): 8 mg/h via INTRAVENOUS
  Filled 2015-12-03 (×7): qty 80

## 2015-12-03 MED ORDER — PANTOPRAZOLE SODIUM 40 MG IV SOLR: 40.0000 mg | Freq: Two times a day (BID) | INTRAVENOUS | Status: DC

## 2015-12-03 MED ORDER — PROPOFOL 1000 MG/100ML IV EMUL
0.0000 ug/kg/min | INTRAVENOUS | Status: DC
Start: 2015-12-03 — End: 2015-12-05
  Administered 2015-12-03: 10 ug/kg/min via INTRAVENOUS
  Administered 2015-12-04 (×2): 30 ug/kg/min via INTRAVENOUS
  Administered 2015-12-04: 12 ug/kg/min via INTRAVENOUS
  Filled 2015-12-03 (×4): qty 100

## 2015-12-03 MED ORDER — PANTOPRAZOLE SODIUM 40 MG IV SOLR: 40.0000 mg | Freq: Every day | INTRAVENOUS | Status: DC

## 2015-12-03 MED ORDER — IOPAMIDOL (ISOVUE-300) INJECTION 61%
100.0000 mL | Freq: Once | INTRAVENOUS | Status: AC | PRN
  Administered 2015-12-03: 100 mL via INTRAVENOUS

## 2015-12-03 MED ORDER — FENTANYL CITRATE (PF) 100 MCG/2ML IJ SOLN: 25.0000 ug | INTRAMUSCULAR | Status: DC | PRN

## 2015-12-03 MED ORDER — PROPOFOL 10 MG/ML IV BOLUS
INTRAVENOUS | Status: DC | PRN
  Administered 2015-12-03: 170 mg via INTRAVENOUS
  Administered 2015-12-03: 70 mg via INTRAVENOUS
  Administered 2015-12-03 (×2): 50 mg via INTRAVENOUS

## 2015-12-03 MED ORDER — ONDANSETRON HCL 4 MG/2ML IJ SOLN
4.0000 mg | Freq: Four times a day (QID) | INTRAMUSCULAR | Status: DC | PRN
  Filled 2015-12-03: qty 2

## 2015-12-03 MED ORDER — FENTANYL BOLUS VIA INFUSION
50.0000 ug | INTRAVENOUS | Status: DC | PRN
  Administered 2015-12-03 – 2015-12-04 (×2): 50 ug via INTRAVENOUS
  Filled 2015-12-03: qty 50

## 2015-12-03 MED ORDER — SODIUM CHLORIDE 0.9 % IV SOLN: 250.0000 mL | INTRAVENOUS | Status: DC | PRN

## 2015-12-03 MED ORDER — PHENYLEPHRINE HCL 10 MG/ML IJ SOLN
INTRAMUSCULAR | Status: DC | PRN
  Administered 2015-12-03: 200 ug via INTRAVENOUS
  Administered 2015-12-03 (×2): 100 ug via INTRAVENOUS

## 2015-12-03 MED ORDER — ONDANSETRON HCL 4 MG/2ML IJ SOLN
4.0000 mg | Freq: Once | INTRAMUSCULAR | Status: AC
  Administered 2015-12-03: 4 mg via INTRAVENOUS
  Filled 2015-12-03: qty 2

## 2015-12-03 MED ORDER — SENNOSIDES 8.8 MG/5ML PO SYRP: 5.0000 mL | ORAL_SOLUTION | Freq: Two times a day (BID) | ORAL | Status: DC | PRN

## 2015-12-03 NOTE — ED Provider Notes (Signed)
[facility] Emergency Department Provider Note  Time seen: 3:53 PM  I have reviewed the triage vital signs and the nursing notes.   HISTORY  Chief Complaint Emesis    HPI Shane Mcknight is a 65 y.o. male with a past medical history of erosive esophagitis, peptic stricture, who presents the emergency department with difficulty swallowing. According to the patient he states over the past several weeks he has been feeling food getting "hung up" in his chest. He states today he attempted to eat a bite of potato and meat, felt it get stuck in his throat/chest. He states since that time he has attempted to drink and has been unable to keep down any liquids including his saliva. Patient has required 2 EGDs in the past last of which was in July and had an esophageal dilation performed. Denies any chest pain, trouble breathing, abdominal pain or nausea. Patient states he has been vomiting multiple times today of undigested food. States with the last episode of vomiting he was having blood in his vomit so he came to the emergency department.  Past Medical History:  Diagnosis Date  . Abnormal liver function tests   . Allergic state   . Arthritis    spine, hands, shoulder with previous cuff tear  . Colon polyp   . Diabetes mellitus ([place])    type 2 since 09/16/2011  . Elevated transaminase level   . GERD (gastroesophageal reflux disease)   . Hiatal hernia   . Hypercholesterolemia   . Hypertension   . Psoriasis   . Psoriatic arthritis ([place])   . Sleep apnea     Patient Active Problem List   Diagnosis Date Noted  . Left shoulder pain 08/13/2015  . Anemia 08/13/2015  . Right groin pain 04/15/2015  . Conjunctivitis 04/15/2015  . Health care maintenance 07/10/2014  . Left leg swelling 05/10/2014  . Sinusitis, acute 03/11/2014  . History of colonic polyps 09/10/2013  . Back pain 05/16/2013  . Change in vision 05/16/2013  . Elevated PSA measurement 04/25/2013  . Skin  lesion 04/12/2013  . Rectal bleeding 04/12/2013  . Obstructive sleep apnea 03/16/2012  . Diabetes mellitus ([place]) 01/30/2012  . Hypertension 01/30/2012  . Psoriatic arthritis ([place]) 01/30/2012  . Abnormal liver function tests 01/30/2012  . GERD (gastroesophageal reflux disease) 01/30/2012    Past Surgical History:  Procedure Laterality Date  . ARTHROPLASTY     right thumb; left thumb  . BALLOON DILATION N/A 05/29/2015   Procedure: BALLOON DILATION;  Surgeon: Lollie Sails, MD;  Location: [facility] ENDOSCOPY;  Service: Endoscopy;  Laterality: N/A;  . BLEPHAROPLASTY    . CARPAL TUNNEL RELEASE     right  . CHOLECYSTECTOMY    . COLONOSCOPY    . CYST EXCISION  04/14/2015   tendon sheath cyst excision; right ring finger cyst removed and trigger finger realease  . ESOPHAGOGASTRODUODENOSCOPY    . ESOPHAGOGASTRODUODENOSCOPY (EGD) WITH PROPOFOL N/A 05/29/2015   Procedure: ESOPHAGOGASTRODUODENOSCOPY (EGD) WITH PROPOFOL;  Surgeon: Lollie Sails, MD;  Location: [facility] ENDOSCOPY;  Service: Endoscopy;  Laterality: N/A;  . ESOPHAGOGASTRODUODENOSCOPY (EGD) WITH PROPOFOL N/A 07/04/2015   Procedure: ESOPHAGOGASTRODUODENOSCOPY (EGD) WITH PROPOFOL;  Surgeon: Lollie Sails, MD;  Location: [facility] ENDOSCOPY;  Service: Endoscopy;  Laterality: N/A;  . HERNIA REPAIR    . LUMBAR DISC SURGERY    . NASAL SINUS SURGERY    . ROTATOR CUFF REPAIR    . TONSILLECTOMY      Prior to Admission medications  Medication Sig Start Date End Date Taking? Authorizing Provider  aspirin 81 MG tablet Take 81 mg by mouth daily.    Historical Provider, MD  atenolol (TENORMIN) 50 MG tablet TAKE 1 TABLET BY MOUTH TWICE DAILY 03/16/15   Einar Pheasant, MD  azithromycin (ZITHROMAX) 250 MG tablet Tale 500 mg PO on day 1, then 250 mg PO q24h x 4 days. 11/23/15   Burnard Hawthorne, FNP  COSENTYX SENSOREADY PEN 150 MG/ML SOAJ  04/06/15   Historical Provider, MD  fish oil-omega-3 fatty acids 1000 MG capsule Take 2 g by mouth daily.     Historical Provider, MD  folic acid (FOLVITE) 1 MG tablet Take 1 mg by mouth daily.    Historical Provider, MD  gabapentin (NEURONTIN) 300 MG capsule Take 300 mg by mouth 3 (three) times daily as needed. 05/14/13   Einar Pheasant, MD  hydrochlorothiazide (HYDRODIURIL) 25 MG tablet TAKE 1 TABLET BY MOUTH EVERY DAY 05/08/15   Einar Pheasant, MD  HYDROcodone-homatropine [facility]) 5-1.5 MG/5ML syrup Take 5 mLs by mouth at bedtime as needed for cough. 11/21/15   Burnard Hawthorne, FNP  losartan (COZAAR) 50 MG tablet TAKE 1 TABLET BY MOUTH EVERY DAY 08/02/15   Einar Pheasant, MD  metFORMIN (GLUCOPHAGE) 500 MG tablet TAKE 4 TABLETS BY MOUTH EVERY EVENING. Patient taking differently: TAKE 2 TABLETS TWO TIMES DAILY 05/08/15   Einar Pheasant, MD  Multiple Vitamin (MULTIVITAMIN) tablet Take 1 tablet by mouth daily.    Historical Provider, MD  pantoprazole (PROTONIX) 40 MG tablet Take 40 mg by mouth daily.    Historical Provider, MD  traMADol (ULTRAM) 50 MG tablet Take 1 tablet (50 mg total) by mouth 2 (two) times daily as needed. 08/11/15   Einar Pheasant, MD  vitamin E 400 UNIT capsule Take 400 Units by mouth daily.    Historical Provider, MD    Allergies  Allergen Reactions  . Amoxicillin   . Ephedrine Other (See Comments)    pain  . Levsin [Hyoscyamine Sulfate] Other (See Comments)    Urinary retention  . Naprosyn [Naproxen] Other (See Comments)    GI upset   . Penicillins   . Sudafed [Pseudoephedrine Hcl]   . Vancomycin   . Other Rash    Paper tape  . Shellfish Allergy Rash    Family History  Problem Relation Age of Onset  . Lung cancer Father   . Hypertension Mother     Social History Social History  Substance Use Topics  . Smoking status: Former Smoker    Quit date: 07/06/1982  . Smokeless tobacco: Never Used  . Alcohol use No    Review of Systems Constitutional: Negative for fever. Cardiovascular: Negative for chest pain. Respiratory: Negative for shortness of  breath. Gastrointestinal: Negative for abdominal pain Genitourinary: Negative for dysuria. Musculoskeletal: Negative for back pain. Skin: Negative for rash. Neurological: Negative for headaches, focal weakness or numbness. 10-point ROS otherwise negative.  ____________________________________________   PHYSICAL EXAM:  VITAL SIGNS: ED Triage Vitals  Enc Vitals Group     BP 12/03/15 1505 (!) 141/95     Pulse Rate 12/03/15 1504 80     Resp 12/03/15 1504 18     Temp 12/03/15 1504 98.9 F (37.2 C)     Temp Source 12/03/15 1504 Oral     SpO2 12/03/15 1504 99 %     Weight 12/03/15 1504 192 lb (87.1 kg)     Height 12/03/15 1504 5\' 8"  (1.727 m)     Head  Circumference --      Peak Flow --      Pain Score 12/03/15 1504 6     Pain Loc --      Pain Edu? --      Excl. in [place]? --     Constitutional: Alert and oriented. Well appearing and in no distress. Eyes: Normal exam ENT   Head: Normocephalic and atraumatic.   Mouth/Throat: Mucous membranes are moist. Cardiovascular: Normal rate, regular rhythm. No murmur Respiratory: Normal respiratory effort without tachypnea nor retractions. Breath sounds are clear Gastrointestinal: Soft and nontender. No distention. Musculoskeletal: Nontender with normal range of motion in all extremities.  Neurologic:  Normal speech and language. No gross focal neurologic deficits Skin:  Skin is warm, dry and intact.  Psychiatric: Mood and affect are normal.   ____________________________________________    EKG  EKG reviewed and interpreted by myself shows normal sinus rhythm at 72 bpm, narrow QRS, left axis deviation, largely normal intervals and normal ST segments.  ____________________________________________    RADIOLOGY  Chest x-ray negative  ____________________________________________   INITIAL IMPRESSION / ASSESSMENT AND PLAN / ED COURSE  Pertinent labs & imaging results that were available during my care of the patient were  reviewed by me and considered in my medical decision making (see chart for details).  Patient presents to department with signs of esophageal food impaction. Labs are largely within normal limits. Troponin is negative. EKG is normal. Chest x-ray is normal. Currently performing an oral trial. If patient fails we will discuss with GI medicine for further recommendations.  Patient was able to take several sips of soda, some of which came back up. Patient states it feels like everything is stuck in his chest. I discussed the patient with Dr.Scholler of GI medicine who will be in to see the patient and likely perform endoscopy.  ____________________________________________   FINAL CLINICAL IMPRESSION(S) / ED DIAGNOSES  Esophageal obstruction    Harvest Dark, MD 12/03/15 [phone]

## 2015-12-03 NOTE — ED Notes (Addendum)
Pt being transferred to Endo from ED.

## 2015-12-03 NOTE — Anesthesia Procedure Notes (Signed)
Procedure Name: Intubation Performed by: Zebbie Ace Pre-anesthesia Checklist: Patient identified, Patient being monitored, Timeout performed, Emergency Drugs available and Suction available Patient Re-evaluated:Patient Re-evaluated prior to inductionOxygen Delivery Method: Circle system utilized Preoxygenation: Pre-oxygenation with 100% oxygen Intubation Type: IV induction Ventilation: Mask ventilation without difficulty Laryngoscope Size: Mac and 3 Grade View: Grade I Tube type: Oral Tube size: 7.5 mm Number of attempts: 1 Airway Equipment and Method: Stylet Placement Confirmation: ETT inserted through vocal cords under direct vision,  positive ETCO2 and breath sounds checked- equal and bilateral Secured at: 23 cm Tube secured with: Tape Dental Injury: Teeth and Oropharynx as per pre-operative assessment        

## 2015-12-03 NOTE — Progress Notes (Signed)
ANTIBIOTIC CONSULT NOTE - INITIAL  Pharmacy Consult for Meropenem  Indication: surgical prophylaxis  Allergies  Allergen Reactions  . Amoxicillin   . Ephedrine Other (See Comments)    pain  . Levsin [Hyoscyamine Sulfate] Other (See Comments)    Urinary retention  . Naprosyn [Naproxen] Other (See Comments)    GI upset   . Penicillins   . Sudafed [Pseudoephedrine Hcl]   . Vancomycin   . Other Rash    Paper tape  . Shellfish Allergy Rash    Patient Measurements: Height: '5\' 8"'  (172.7 cm) Weight: 192 lb (87.1 kg) IBW/kg (Calculated) : 68.4 Adjusted Body Weight:   Vital Signs: Temp: 98.2 F (36.8 C) (11/19 1819) Temp Source: Tympanic (11/19 1819) BP: 163/91 (11/19 1819) Pulse Rate: 80 (11/19 1819) Intake/Output from previous day: No intake/output data recorded. Intake/Output from this shift: Total I/O In: 350 [I.V.:350] Out: -   Labs:  Recent Labs  12/03/15 1510  WBC 9.2  HGB 14.3  PLT 206  CREATININE 0.89   Estimated Creatinine Clearance: 86.5 mL/min (by C-G formula based on SCr of 0.89 mg/dL). No results for input(s): VANCOTROUGH, VANCOPEAK, VANCORANDOM, GENTTROUGH, GENTPEAK, GENTRANDOM, TOBRATROUGH, TOBRAPEAK, TOBRARND, AMIKACINPEAK, AMIKACINTROU, AMIKACIN in the last 72 hours.   Microbiology: No results found for this or any previous visit (from the past 720 hour(s)).  Medical History: Past Medical History:  Diagnosis Date  . Abnormal liver function tests   . Allergic state   . Arthritis    spine, hands, shoulder with previous cuff tear  . Colon polyp   . Diabetes mellitus ([place])    type 2 since 09/16/2011  . Elevated transaminase level   . GERD (gastroesophageal reflux disease)   . Hiatal hernia   . Hypercholesterolemia   . Hypertension   . Psoriasis   . Psoriatic arthritis ([place])   . Sleep apnea     Medications:  Scheduled:  . atenolol  50 mg Per Tube BID  . chlorhexidine gluconate (MEDLINE KIT)  15 mL Mouth Rinse BID  . losartan  50 mg  Per Tube Daily  . mouth rinse  15 mL Mouth Rinse 10 times per day  . meropenem (MERREM) IV  1 g Intravenous Q8H  . pantoprazole (PROTONIX) IVPB  80 mg Intravenous Once   Assessment: Pharmacy consulted to dose meropenem for surgical prophylaxis in this 65 year old male S/P surgical removal of foreign body.  Pt has PCN allergy. CrCl = 86.5 ml/min   Goal of Therapy:  prevention of infection  Plan:  Expected duration 7 days with resolution of temperature and/or normalization of WBC   Meropenem 1 gm IV Q8H ordered to start 11/19 @ 22:00.   Sabryn Preslar D 12/03/2015,8:30 PM

## 2015-12-03 NOTE — Interval H&P Note (Signed)
History and Physical Interval Note:  12/03/2015 7:04 PM  Shane Mcknight  has presented today for surgery, with the diagnosis of fb in esophagus  The various methods of treatment have been discussed with the patient and family. After consideration of risks, benefits and other options for treatment, the patient has consented to  Procedure(s): FOREIGN BODY REMOVAL (N/A) as a surgical intervention .  [name] history has been reviewed, patient examined, no change in status, stable for surgery.  I have reviewed the patient's chart and labs.  Questions were answered to the patient's satisfaction.     [place] C.

## 2015-12-03 NOTE — H&P (View-Only) (Signed)
Referring Provider: Dr. Kerman Passey Primary Care Physician:  Einar Pheasant, MD Primary Gastroenterologist:  Dr. Gustavo Lah  Reason for Consultation:  Food Impaction  HPI: Shane Mcknight is a 65 y.o. male with a history of an esophageal ring and dilation last done in June 2017 to 12 mm with a TTS balloon. Erosive esophagitis (Grade C) noted on that EGD. EGD in May 2017 showed esophagitis and Candida esophagitis. Has been having difficulty swallowing food since the dilation about every other meal. Reports sensation of liquids also hanging up with swallowing. He delayed following up for additional dilations due to shoulder problems. Chronic GERD on daily Protonix and was on Nexium earlier this year.   Today he felt like a baked potato and meat hung up in his chest as soon as he started eating it for lunch. He tried to vomit several times but the sensation in his throat continued. He could not swallow his secretions or liquids following that at home. He started seeing a small amount of blood in his vomitus and came to the hospital. In the ER he could keep fluids down for about a minute and then would vomit it back up. He is able to keep his saliva down better than he did earlier today. Denies black stools. Takes a baby aspirin and denies other NSAIDs. Wife and son at bedside.  Past Medical History:  Diagnosis Date  . Abnormal liver function tests   . Allergic state   . Arthritis    spine, hands, shoulder with previous cuff tear  . Colon polyp   . Diabetes mellitus ([place])    type 2 since 09/16/2011  . Elevated transaminase level   . GERD (gastroesophageal reflux disease)   . Hiatal hernia   . Hypercholesterolemia   . Hypertension   . Psoriasis   . Psoriatic arthritis ([place])   . Sleep apnea     Past Surgical History:  Procedure Laterality Date  . ARTHROPLASTY     right thumb; left thumb  . BALLOON DILATION N/A 05/29/2015   Procedure: BALLOON DILATION;  Surgeon: Lollie Sails, MD;  Location:  [facility] ENDOSCOPY;  Service: Endoscopy;  Laterality: N/A;  . BLEPHAROPLASTY    . CARPAL TUNNEL RELEASE     right  . CHOLECYSTECTOMY    . COLONOSCOPY    . CYST EXCISION  04/14/2015   tendon sheath cyst excision; right ring finger cyst removed and trigger finger realease  . ESOPHAGOGASTRODUODENOSCOPY    . ESOPHAGOGASTRODUODENOSCOPY (EGD) WITH PROPOFOL N/A 05/29/2015   Procedure: ESOPHAGOGASTRODUODENOSCOPY (EGD) WITH PROPOFOL;  Surgeon: Lollie Sails, MD;  Location[facility] ENDOSCOPY;  Service: Endoscopy;  Laterality: N/A;  . ESOPHAGOGASTRODUODENOSCOPY (EGD) WITH PROPOFOL N/A 07/04/2015   Procedure: ESOPHAGOGASTRODUODENOSCOPY (EGD) WITH PROPOFOL;  Surgeon: Lollie Sails, MD;  Location: [facility] ENDOSCOPY;  Service: Endoscopy;  Laterality: N/A;  . HERNIA REPAIR    . LUMBAR DISC SURGERY    . NASAL SINUS SURGERY    . ROTATOR CUFF REPAIR    . TONSILLECTOMY      Prior to Admission medications   Medication Sig Start Date End Date Taking? Authorizing Provider  aspirin 81 MG tablet Take 81 mg by mouth daily.   Yes Historical Provider, MD  atenolol (TENORMIN) 50 MG tablet TAKE 1 TABLET BY MOUTH TWICE DAILY 03/16/15  Yes Einar Pheasant, MD  COSENTYX SENSOREADY PEN 150 MG/ML SOAJ Inject 1 Dose into the skin every 30 (thirty) days. Due 12/16/2015 04/06/15  Yes Historical Provider, MD  folic acid (FOLVITE) 1 MG  tablet Take 1 mg by mouth daily.   Yes Historical Provider, MD  gabapentin (NEURONTIN) 300 MG capsule Take 300 mg by mouth 3 (three) times daily as needed. 05/14/13  Yes Einar Pheasant, MD  hydrochlorothiazide (HYDRODIURIL) 25 MG tablet TAKE 1 TABLET BY MOUTH EVERY DAY 05/08/15  Yes Einar Pheasant, MD  losartan (COZAAR) 50 MG tablet TAKE 1 TABLET BY MOUTH EVERY DAY 08/02/15  Yes Einar Pheasant, MD  metFORMIN (GLUCOPHAGE) 500 MG tablet TAKE 4 TABLETS BY MOUTH EVERY EVENING. Patient taking differently: TAKE 2 TABLETS TWO TIMES DAILY 05/08/15  Yes Einar Pheasant, MD  Multiple Vitamin (MULTIVITAMIN) tablet Take  1 tablet by mouth daily.   Yes Historical Provider, MD  pantoprazole (PROTONIX) 40 MG tablet Take 40 mg by mouth daily.   Yes Historical Provider, MD  vitamin E 400 UNIT capsule Take 400 Units by mouth daily.   Yes Historical Provider, MD  azithromycin (ZITHROMAX) 250 MG tablet Tale 500 mg PO on day 1, then 250 mg PO q24h x 4 days. Patient not taking: Reported on 12/03/2015 11/23/15   Burnard Hawthorne, FNP  fish oil-omega-3 fatty acids 1000 MG capsule Take 2 g by mouth daily.    Historical Provider, MD  HYDROcodone-homatropine (HYCODAN) 5-1.5 MG/5ML syrup Take 5 mLs by mouth at bedtime as needed for cough. Patient not taking: Reported on 12/03/2015 11/21/15   Burnard Hawthorne, FNP  traMADol (ULTRAM) 50 MG tablet Take 1 tablet (50 mg total) by mouth 2 (two) times daily as needed. Patient not taking: Reported on 12/03/2015 08/11/15   Einar Pheasant, MD    Scheduled Meds: Continuous Infusions: . sodium chloride     PRN Meds:.fentaNYL (SUBLIMAZE) injection, ondansetron (ZOFRAN) IV  Allergies as of 12/03/2015 - Review Complete 12/03/2015  Allergen Reaction Noted  . Amoxicillin  01/30/2012  . Ephedrine Other (See Comments) 01/30/2012  . Levsin [hyoscyamine sulfate] Other (See Comments) 01/30/2012  . Naprosyn [naproxen] Other (See Comments) 01/30/2012  . Penicillins  01/30/2012  . Sudafed [pseudoephedrine hcl]  01/30/2012  . Vancomycin  01/30/2012  . Other Rash 06/30/2014  . Shellfish allergy Rash 01/30/2012    Family History  Problem Relation Age of Onset  . Lung cancer Father   . Hypertension Mother     Social History   Social History  . Marital status: Married    Spouse name: N/A  . Number of children: N/A  . Years of education: N/A   Occupational History  . Not on file.   Social History Main Topics  . Smoking status: Former Smoker    Quit date: 07/06/1982  . Smokeless tobacco: Never Used  . Alcohol use No  . Drug use: No  . Sexual activity: Not on file   Other  Topics Concern  . Not on file   Social History Narrative  . No narrative on file    Review of Systems: All negative except as stated above in HPI.  Physical Exam: Vital signs: Vitals:   12/03/15 1505 12/03/15 1819  BP: (!) 141/95 (!) 163/91  Pulse:  80  Resp:  18  Temp:  98.2 F (36.8 C)     General:   Alert,  Well-developed, well-nourished, pleasant and cooperative in NAD HEENT: anicteric sclera; oropharynx clear Neck: supple, nontender Lungs:  Clear throughout to auscultation.   No wheezes, crackles, or rhonchi. No acute distress. Heart:  Regular rate and rhythm; no murmurs, clicks, rubs,  or gallops. Abdomen: epigastric tenderness with minimal guarding, soft, nondistended, +BS  Rectal:  Deferred Ext: no edema  GI:  Lab Results:  Recent Labs  12/03/15 1510  WBC 9.2  HGB 14.3  HCT 41.2  PLT 206   BMET  Recent Labs  12/03/15 1510  NA 140  K 3.4*  CL 104  CO2 24  GLUCOSE 127*  BUN 11  CREATININE 0.89  CALCIUM 9.8   LFT  Recent Labs  12/03/15 1510  PROT 7.9  ALBUMIN 4.5  AST 33  ALT 34  ALKPHOS 44  BILITOT 0.8   PT/INR No results for input(s): LABPROT, INR in the last 72 hours.   Studies/Results: Dg Chest 2 View  Result Date: 12/03/2015 CLINICAL DATA:  Chest tightness. EXAM: CHEST  2 VIEW COMPARISON:  11/23/2015 FINDINGS: Heart and mediastinal contours are within normal limits. No focal opacities or effusions. No acute bony abnormality. Small hiatal hernia. IMPRESSION: No active cardiopulmonary disease. Electronically Signed   By: Rolm Baptise M.D.   On: 12/03/2015 15:32    Impression/Plan: 65 yo with recurrent dysphagia with dilation for a distal esophageal ring in June 2017 presenting with a food impaction in need of an EGD. Will need f/u EGD in near future for dilation by Dr. Gustavo Lah. NPO for EGD.     LOS: 0 days   [place] C.  12/03/2015, 6:48 PM

## 2015-12-03 NOTE — Brief Op Note (Signed)
Large adherent clot at GEJ concerning for a large Mallory Weiss tear vs. Boerhaave's. Keep intubated for airway protection in case bleeding occurs. Protonix drip. Admit to ICU team. Stat abd/chest CT to evaluate for perforation and if perforation seen then will need a surgical consult. D/W Dr. Alva Garnet who will accept the patient to the ICU.

## 2015-12-03 NOTE — Consult Note (Signed)
Referring Provider: Dr. Kerman Passey Primary Care Physician:  Einar Pheasant, MD Primary Gastroenterologist:  Dr. Gustavo Lah  Reason for Consultation:  Food Impaction  HPI: Shane Mcknight is a 66 y.o. male with a history of an esophageal ring and dilation last done in June 2017 to 12 mm with a TTS balloon. Erosive esophagitis (Grade C) noted on that EGD. EGD in May 2017 showed esophagitis and Candida esophagitis. Has been having difficulty swallowing food since the dilation about every other meal. Reports sensation of liquids also hanging up with swallowing. He delayed following up for additional dilations due to shoulder problems. Chronic GERD on daily Protonix and was on Nexium earlier this year.   Today he felt like a baked potato and meat hung up in his chest as soon as he started eating it for lunch. He tried to vomit several times but the sensation in his throat continued. He could not swallow his secretions or liquids following that at home. He started seeing a small amount of blood in his vomitus and came to the hospital. In the ER he could keep fluids down for about a minute and then would vomit it back up. He is able to keep his saliva down better than he did earlier today. Denies black stools. Takes a baby aspirin and denies other NSAIDs. Wife and son at bedside.  Past Medical History:  Diagnosis Date  . Abnormal liver function tests   . Allergic state   . Arthritis    spine, hands, shoulder with previous cuff tear  . Colon polyp   . Diabetes mellitus ([place])    type 2 since 09/16/2011  . Elevated transaminase level   . GERD (gastroesophageal reflux disease)   . Hiatal hernia   . Hypercholesterolemia   . Hypertension   . Psoriasis   . Psoriatic arthritis ([place])   . Sleep apnea     Past Surgical History:  Procedure Laterality Date  . ARTHROPLASTY     right thumb; left thumb  . BALLOON DILATION N/A 05/29/2015   Procedure: BALLOON DILATION;  Surgeon: Lollie Sails, MD;  Location:  [facility] ENDOSCOPY;  Service: Endoscopy;  Laterality: N/A;  . BLEPHAROPLASTY    . CARPAL TUNNEL RELEASE     right  . CHOLECYSTECTOMY    . COLONOSCOPY    . CYST EXCISION  04/14/2015   tendon sheath cyst excision; right ring finger cyst removed and trigger finger realease  . ESOPHAGOGASTRODUODENOSCOPY    . ESOPHAGOGASTRODUODENOSCOPY (EGD) WITH PROPOFOL N/A 05/29/2015   Procedure: ESOPHAGOGASTRODUODENOSCOPY (EGD) WITH PROPOFOL;  Surgeon: Lollie Sails, MD;  Location: [facility] ENDOSCOPY;  Service: Endoscopy;  Laterality: N/A;  . ESOPHAGOGASTRODUODENOSCOPY (EGD) WITH PROPOFOL N/A 07/04/2015   Procedure: ESOPHAGOGASTRODUODENOSCOPY (EGD) WITH PROPOFOL;  Surgeon: Lollie Sails, MD;  Location: [facility] ENDOSCOPY;  Service: Endoscopy;  Laterality: N/A;  . HERNIA REPAIR    . LUMBAR DISC SURGERY    . NASAL SINUS SURGERY    . ROTATOR CUFF REPAIR    . TONSILLECTOMY      Prior to Admission medications   Medication Sig Start Date End Date Taking? Authorizing Provider  aspirin 81 MG tablet Take 81 mg by mouth daily.   Yes Historical Provider, MD  atenolol (TENORMIN) 50 MG tablet TAKE 1 TABLET BY MOUTH TWICE DAILY 03/16/15  Yes Einar Pheasant, MD  COSENTYX SENSOREADY PEN 150 MG/ML SOAJ Inject 1 Dose into the skin every 30 (thirty) days. Due 12/16/2015 04/06/15  Yes Historical Provider, MD  folic acid (FOLVITE) 1 MG  tablet Take 1 mg by mouth daily.   Yes Historical Provider, MD  gabapentin (NEURONTIN) 300 MG capsule Take 300 mg by mouth 3 (three) times daily as needed. 05/14/13  Yes Einar Pheasant, MD  hydrochlorothiazide (HYDRODIURIL) 25 MG tablet TAKE 1 TABLET BY MOUTH EVERY DAY 05/08/15  Yes Einar Pheasant, MD  losartan (COZAAR) 50 MG tablet TAKE 1 TABLET BY MOUTH EVERY DAY 08/02/15  Yes Einar Pheasant, MD  metFORMIN (GLUCOPHAGE) 500 MG tablet TAKE 4 TABLETS BY MOUTH EVERY EVENING. Patient taking differently: TAKE 2 TABLETS TWO TIMES DAILY 05/08/15  Yes Einar Pheasant, MD  Multiple Vitamin (MULTIVITAMIN) tablet Take  1 tablet by mouth daily.   Yes Historical Provider, MD  pantoprazole (PROTONIX) 40 MG tablet Take 40 mg by mouth daily.   Yes Historical Provider, MD  vitamin E 400 UNIT capsule Take 400 Units by mouth daily.   Yes Historical Provider, MD  azithromycin (ZITHROMAX) 250 MG tablet Tale 500 mg PO on day 1, then 250 mg PO q24h x 4 days. Patient not taking: Reported on 12/03/2015 11/23/15   Shane Hawthorne, FNP  fish oil-omega-3 fatty acids 1000 MG capsule Take 2 g by mouth daily.    Historical Provider, MD  HYDROcodone-homatropine (HYCODAN) 5-1.5 MG/5ML syrup Take 5 mLs by mouth at bedtime as needed for cough. Patient not taking: Reported on 12/03/2015 11/21/15   Shane Hawthorne, FNP  traMADol (ULTRAM) 50 MG tablet Take 1 tablet (50 mg total) by mouth 2 (two) times daily as needed. Patient not taking: Reported on 12/03/2015 08/11/15   Einar Pheasant, MD    Scheduled Meds: Continuous Infusions: . sodium chloride     PRN Meds:.fentaNYL (SUBLIMAZE) injection, ondansetron (ZOFRAN) IV  Allergies as of 12/03/2015 - Review Complete 12/03/2015  Allergen Reaction Noted  . Amoxicillin  01/30/2012  . Ephedrine Other (See Comments) 01/30/2012  . Levsin [hyoscyamine sulfate] Other (See Comments) 01/30/2012  . Naprosyn [naproxen] Other (See Comments) 01/30/2012  . Penicillins  01/30/2012  . Sudafed [pseudoephedrine hcl]  01/30/2012  . Vancomycin  01/30/2012  . Other Rash 06/30/2014  . Shellfish allergy Rash 01/30/2012    Family History  Problem Relation Age of Onset  . Lung cancer Father   . Hypertension Mother     Social History   Social History  . Marital status: Married    Spouse name: N/A  . Number of children: N/A  . Years of education: N/A   Occupational History  . Not on file.   Social History Main Topics  . Smoking status: Former Smoker    Quit date: 07/06/1982  . Smokeless tobacco: Never Used  . Alcohol use No  . Drug use: No  . Sexual activity: Not on file   Other  Topics Concern  . Not on file   Social History Narrative  . No narrative on file    Review of Systems: All negative except as stated above in HPI.  Physical Exam: Vital signs: Vitals:   12/03/15 1505 12/03/15 1819  BP: (!) 141/95 (!) 163/91  Pulse:  80  Resp:  18  Temp:  98.2 F (36.8 C)     General:   Alert,  Well-developed, well-nourished, pleasant and cooperative in NAD HEENT: anicteric sclera; oropharynx clear Neck: supple, nontender Lungs:  Clear throughout to auscultation.   No wheezes, crackles, or rhonchi. No acute distress. Heart:  Regular rate and rhythm; no murmurs, clicks, rubs,  or gallops. Abdomen: epigastric tenderness with minimal guarding, soft, nondistended, +BS  Rectal:  Deferred Ext: no edema  GI:  Lab Results:  Recent Labs  12/03/15 1510  WBC 9.2  HGB 14.3  HCT 41.2  PLT 206   BMET  Recent Labs  12/03/15 1510  NA 140  K 3.4*  CL 104  CO2 24  GLUCOSE 127*  BUN 11  CREATININE 0.89  CALCIUM 9.8   LFT  Recent Labs  12/03/15 1510  PROT 7.9  ALBUMIN 4.5  AST 33  ALT 34  ALKPHOS 44  BILITOT 0.8   PT/INR No results for input(s): LABPROT, INR in the last 72 hours.   Studies/Results: Dg Chest 2 View  Result Date: 12/03/2015 CLINICAL DATA:  Chest tightness. EXAM: CHEST  2 VIEW COMPARISON:  11/23/2015 FINDINGS: Heart and mediastinal contours are within normal limits. No focal opacities or effusions. No acute bony abnormality. Small hiatal hernia. IMPRESSION: No active cardiopulmonary disease. Electronically Signed   By: Rolm Baptise M.D.   On: 12/03/2015 15:32    Impression/Plan: 65 yo with recurrent dysphagia with dilation for a distal esophageal ring in June 2017 presenting with a food impaction in need of an EGD. Will need f/u EGD in near future for dilation by Dr. Gustavo Lah. NPO for EGD.     LOS: 0 days   [place] C.  12/03/2015, 6:48 PM

## 2015-12-03 NOTE — H&P (Signed)
PULMONARY / CRITICAL CARE MEDICINE   Name: HALFORD SPAYD MRN: [id number] DOB: 08/08/1950    ADMISSION DATE:  12/03/2015   REFERRING MD:  Dr. Michail Sermon  CHIEF COMPLAINT: Difficulty swallowing  HISTORY OF PRESENT ILLNESS:   This is a 65 year old Caucasian male with a past medical history of grade C erosive esophagitis, esophageal ring and dilated dictation, type 2 diabetes, acid reflux, hiatal hernia, obstructive sleep apnea, and hypertension who presented with difficulty swallowing. History is obtained from patient's wife, and ED records. Based on ED records, patient's symptoms started over the past several weeks and gradually got worse to the point where he was unable to swallow effectively. He describes his symptoms as "food getting hung up in his chest". Prior to ED arrival, patient was eating a bite of potato and mid but felt like it got stuck in his chest. He tried dislodging it with some water but started vomiting. He reported vomiting multiple times prior to coming to the ED and emesis was mostly undigested but subsequently became bloody hence, he decided to come to the ED. His last EGD was in May 2017 and showed esophagitis and Candida esophagitis. He takes an aspirin a day but does not take any other NSAIDs or anticoagulants. He had an EGD today that a large adherent clot at the GEJ suggestive of a large Mallory Weiss tear. He is being maintained on full vent support for airway protection pending evaluation for further surgical interventions. PCCM was consulted for ICU and vent management.   PAST MEDICAL HISTORY :  He  has a past medical history of Abnormal liver function tests; Allergic state; Arthritis; Colon polyp; Diabetes mellitus ([place]); Elevated transaminase level; GERD (gastroesophageal reflux disease); Hiatal hernia; Hypercholesterolemia; Hypertension; Psoriasis; Psoriatic arthritis ([place]); and Sleep apnea.  PAST SURGICAL HISTORY: He  has a past surgical history that includes  Cholecystectomy; Rotator cuff repair; Arthroplasty; Carpal tunnel release; Lumbar disc surgery; Blepharoplasty; Tonsillectomy; Hernia repair; Colonoscopy; Esophagogastroduodenoscopy; Nasal sinus surgery; Cyst excision (04/14/2015); Esophagogastroduodenoscopy (egd) with propofol (N/A, 05/29/2015); Balloon dilation (N/A, 05/29/2015); and Esophagogastroduodenoscopy (egd) with propofol (N/A, 07/04/2015).  Allergies  Allergen Reactions  . Amoxicillin   . Ephedrine Other (See Comments)    pain  . Levsin [Hyoscyamine Sulfate] Other (See Comments)    Urinary retention  . Naprosyn [Naproxen] Other (See Comments)    GI upset   . Penicillins   . Sudafed [Pseudoephedrine Hcl]   . Vancomycin   . Other Rash    Paper tape  . Shellfish Allergy Rash    No current facility-administered medications on file prior to encounter.    Current Outpatient Prescriptions on File Prior to Encounter  Medication Sig  . aspirin 81 MG tablet Take 81 mg by mouth daily.  Marland Kitchen atenolol (TENORMIN) 50 MG tablet TAKE 1 TABLET BY MOUTH TWICE DAILY  . COSENTYX SENSOREADY PEN 150 MG/ML SOAJ Inject 1 Dose into the skin every 30 (thirty) days. Due [id number]  . folic acid (FOLVITE) 1 MG tablet Take 1 mg by mouth daily.  Marland Kitchen gabapentin (NEURONTIN) 300 MG capsule Take 300 mg by mouth 3 (three) times daily as needed.  . hydrochlorothiazide (HYDRODIURIL) 25 MG tablet TAKE 1 TABLET BY MOUTH EVERY DAY  . losartan (COZAAR) 50 MG tablet TAKE 1 TABLET BY MOUTH EVERY DAY  . metFORMIN (GLUCOPHAGE) 500 MG tablet TAKE 4 TABLETS BY MOUTH EVERY EVENING. (Patient taking differently: TAKE 2 TABLETS TWO TIMES DAILY)  . Multiple Vitamin (MULTIVITAMIN) tablet Take 1 tablet by mouth daily.  Marland Kitchen  pantoprazole (PROTONIX) 40 MG tablet Take 40 mg by mouth daily.  . vitamin E 400 UNIT capsule Take 400 Units by mouth daily.  Marland Kitchen azithromycin (ZITHROMAX) 250 MG tablet Tale 500 mg PO on day 1, then 250 mg PO q24h x 4 days. (Patient not taking: Reported on 12/03/2015)   . fish oil-omega-3 fatty acids 1000 MG capsule Take 2 g by mouth daily.  Marland Kitchen HYDROcodone-homatropine (HYCODAN) 5-1.5 MG/5ML syrup Take 5 mLs by mouth at bedtime as needed for cough. (Patient not taking: Reported on 12/03/2015)  . traMADol (ULTRAM) 50 MG tablet Take 1 tablet (50 mg total) by mouth 2 (two) times daily as needed. (Patient not taking: Reported on 12/03/2015)    FAMILY HISTORY:  His indicated that the status of his mother is unknown. He indicated that the status of his father is unknown.    SOCIAL HISTORY: He  reports that he quit smoking about 33 years ago. He has never used smokeless tobacco. He reports that he does not drink alcohol or use drugs.  REVIEW OF SYSTEMS:   Unable to obtain due to patient being intubated  SUBJECTIVE:    VITAL SIGNS: BP (!) 163/91   Pulse 80   Temp 98.2 F (36.8 C) (Tympanic)   Resp 18   Ht 5\' 8"  (1.727 m)   Wt 192 lb (87.1 kg)   SpO2 97%   BMI 29.19 kg/m   HEMODYNAMICS:    VENTILATOR SETTINGS: Vent Mode: PRVC FiO2 (%):  [40 %] 40 % Set Rate:  [18 bmp] 18 bmp Vt Set:  [550 mL] 550 mL PEEP:  [5 cmH20] 5 cmH20  INTAKE / OUTPUT: No intake/output data recorded.  PHYSICAL EXAMINATION: General:  Acutely ill-looking; older for stated age Neuro: Sedated but awakens to voice and touch; follows basic commands, gag reflex intact HEENT: Head is normocephalic, nontraumatic, ear and nasal passages patent, oral mucosa moist, trachea midline Cardiovascular: RRR, S1, S2, no MRG Lungs: Clear to auscultation bilaterally Abdomen: Distended, soft, normal bowel sounds Musculoskeletal: No joint swelling, no visible deformities, positive range of motion in upper and lower extremities Skin: Warm, dry and no cyanosis  LABS:  BMET  Recent Labs Lab 12/03/15 1510  NA 140  K 3.4*  CL 104  CO2 24  BUN 11  CREATININE 0.89  GLUCOSE 127*    Electrolytes  Recent Labs Lab 12/03/15 1510  CALCIUM 9.8    CBC  Recent Labs Lab  12/03/15 1510  WBC 9.2  HGB 14.3  HCT 41.2  PLT 206    Coag's No results for input(s): APTT, INR in the last 168 hours.  Sepsis Markers No results for input(s): LATICACIDVEN, PROCALCITON, O2SATVEN in the last 168 hours.  ABG  Recent Labs Lab 12/03/15 2030  PHART 7.44  PCO2ART 39  PO2ART 90    Liver Enzymes  Recent Labs Lab 12/03/15 1510  AST 33  ALT 34  ALKPHOS 44  BILITOT 0.8  ALBUMIN 4.5    Cardiac Enzymes  Recent Labs Lab 12/03/15 1510  TROPONINI <0.03    Glucose No results for input(s): GLUCAP in the last 168 hours.  Imaging Dg Chest 1 View  Result Date: 12/03/2015 CLINICAL DATA:  Hypoxia EXAM: CHEST 1 VIEW COMPARISON:  Study obtained earlier in the day FINDINGS: Endotracheal tube tip is 1.1 cm above the carina. No pneumothorax. There is left base atelectasis. Lungs elsewhere are clear. Heart size and pulmonary vascularity are normal. No adenopathy. There is a small hiatal hernia. Stomach is distended with  air. IMPRESSION: Endotracheal tube as described without pneumothorax. Left base atelectasis. Lungs elsewhere clear. Small hiatal hernia. Stomach distended with air. Electronically Signed   By: Lowella Grip III M.D.   On: 12/03/2015 20:27   Dg Chest 2 View  Result Date: 12/03/2015 CLINICAL DATA:  Chest tightness. EXAM: CHEST  2 VIEW COMPARISON:  11/23/2015 FINDINGS: Heart and mediastinal contours are within normal limits. No focal opacities or effusions. No acute bony abnormality. Small hiatal hernia. IMPRESSION: No active cardiopulmonary disease. Electronically Signed   By: Rolm Baptise M.D.   On: 12/03/2015 15:32    STUDIES:  EGD for foreign body extraction from the esophagus: Esophagitis withLarge adherent clot at GEJ concerning for a large Mallory Weiss tear vs. Boerhaave's  CULTURES: None  ANTIBIOTICS: Meropenem 12/03/2015  SIGNIFICANT EVENTS: 12/03/2015: ED with acute GI bleed and foreign body impaction in esophagus; EGD for foreign  body extraction; ICU for vent management post procedure  LINES/TUBES: PIVs Foley ETT  DISCUSSION: 65 Y/O male with foreign body impaction in the esophagus, acute GI bleed, and esophagitis with possible esophageal tear; now post EGD, and Sedated on full vent support  ASSESSMENT / PLAN:  PULMONARY A: Vent support for airway protection History of sleep apnea Lactic acidosis-lactic acid level trending up, now 2.4 P:   Continue full vent support with current settings. Chest x-ray in the morning Nebulized bronchodilator as needed for wheezing. ABG as needed IV fluids   CARDIOVASCULAR A:  Hypotension-most likely due to sedating medications History of hypertension and hyperlipidemia P:  Hemodynamic monitoring per ICU protocol. Patient is currently nothing by mouth hold all antihypertensive and hyperlipidemic medications Continue IV fluids and pressors to maintain mean arterial blood pressure greater than or equal 65 mmHg  RENAL A:   Hypokalemia. Hypomagnesemia P:   Monitor and correct electrolyte abnormalities.  GASTROINTESTINAL A:   Foreign body impaction in the esophagus, status post EGD and extraction. Acute upper GI bleed Grade C esophagitis on EGD. Rule out esophageal tear History of esophageal stricture P:   Stat CTA chest and abdomen done; GI to review results and communicate further disposition to consulting teams and family. Continue Protonix infusion per GI Monitor for signs and symptoms of bleeding Resumption of oral intake per GI  HEMATOLOGIC A:   No acute issues P:  SCDs for VTE  prophylaxis.  No pharmacologic VTE prophylaxis due to risk of bleeding  INFECTIOUS A:   Mild leukocytosis P:   Meropenem for GI surgical prophylaxis We'll discontinue if WBC continues to trend down  ENDOCRINE A:   Type 2 diabetes   P:   Blood glucose monitoring with sliding scale insulin coverage  Monitor and treat hypoglycemia as patient is currently nothing by  mouth  NEUROLOGIC A:   Ventilator associated discomfort P:   RASS goal: 0 to -1 Fentanyl and propofol for sedation and comfort   FAMILY  - Updates: Wife and son updated at bedside; questions answered   - Inter-disciplinary family meet or Palliative Care meeting due by day 7   Case discussed with Dr. Alva Garnet and Dr. Oletta Darter at [facility]. Further changes in treatment plan pending clinical course and diagnostics  Total critical care time equal 60 minutes  Magdalene S. [facility] ANP-BC Pulmonary and Critical Care Medicine [facility] Pager [phone] or [phone] 12/03/2015, 9:09 PM   Please see my attestation note for 12/04/15.   Marda Stalker, M.D.

## 2015-12-03 NOTE — Op Note (Signed)
[facility] Gastroenterology Patient Name: Shane Mcknight Procedure Date: 12/03/2015 6:36 PM MRN: [id number] Account #: [id number] Date of Birth: 08-09-1950 Admit Type: Outpatient Age: 65 Room: [facility], [place] ENDO ROOM 4 Gender: Male Note Status: Finalized Procedure:            Upper GI endoscopy Indications:          Dysphagia, Foreign body in the esophagus Providers:            Lear Ng, MD Medicines:            Propofol per Anesthesia; Patient electively intubated                        prior to EGD by anesthesia Complications:        No immediate complications. Procedure:            Pre-Anesthesia Assessment:                       - Prior to the procedure, a History and Physical was                        performed, and patient medications and allergies were                        reviewed. [name] tolerance of previous                        anesthesia was also reviewed. The risks and benefits of                        the procedure and the sedation options and risks were                        discussed with the patient. All questions were                        answered, and informed consent was obtained. Prior                        Anticoagulants: The patient has taken no previous                        anticoagulant or antiplatelet agents. ASA Grade                        Assessment: III - A patient with severe systemic                        disease. After reviewing the risks and benefits, the                        patient was deemed in satisfactory condition to undergo                        the procedure.                       After obtaining informed consent, the endoscope was  passed under direct vision. Throughout the procedure,                        the patient's blood pressure, pulse, and oxygen                        saturations were monitored continuously. The                        Colonoscope was introduced through  the mouth, and                        advanced to the second part of duodenum. The upper GI                        endoscopy was performed with difficulty due to adherent                        blood clot. The patient tolerated the procedure well. Findings:      The Z-line was found 40 cm from the incisors.      A low-grade of narrowing and mild Schatzki ring (acquired) was found at       the gastroesophageal junction. Mild resistance with passage of endoscope       into the stomach.      A large adherent clot noted in the cardia concerning for a large       Mallory-Weiss tear that could not be irrigated away. No active bleeding       seen at the clot but limited visualization due to the large clot.      Segmental minimal inflammation characterized by congestion (edema) and       erythema was found in the gastric antrum.      A medium-sized hiatal hernia was present.      The examined duodenum was normal. Impression:           - Z-line, 40 cm from the incisors.                       - Low-grade of narrowing and mild Schatzki ring.                       - Mallory-Weiss tear.                       - Acute gastritis.                       - Medium-sized hiatal hernia.                       - Normal examined duodenum.                       - No specimens collected. Recommendation:       - Admit the patient to ICU for ongoing care (admitted                        to critical care team).                       - NPO.                       -  Give Protonix (pantoprazole): initiate therapy with                        80 mg IV bolus, then 8 mg/hr IV by continuous infusion.                       - Perform a CT scan (computed tomography) of chest with                        contrast and abdomen with contrast to evaluate for                        esophageal perforation from the retching and subsequent                        trauma that occurred earlier today.                       - Follow H/Hs  closely. Will keep on ventilator                        overnight for airway protection in case rebleeding                        occurs. Procedure Code(s):    --- Professional ---                       [phone], Esophagogastroduodenoscopy, flexible, transoral;                        diagnostic, including collection of specimen(s) by                        brushing or washing, when performed (separate procedure) Diagnosis Code(s):    --- Professional ---                       R13.10, Dysphagia, unspecified                       K22.6, Gastro-esophageal laceration-hemorrhage syndrome                       K22.2, Esophageal obstruction                       K29.00, Acute gastritis without bleeding                       T18.108A, Unspecified foreign body in esophagus causing                        other injury, initial encounter                       K44.9, Diaphragmatic hernia without obstruction or                        gangrene CPT copyright 2016 American Medical Association. All rights reserved. The codes documented in this report are preliminary and upon coder review may  be revised to meet current compliance requirements. Lear Ng, MD 12/03/2015 8:00:22 PM This report has been signed  electronically. Number of Addenda: 0 Note Initiated On: 12/03/2015 6:36 PM      [facility]

## 2015-12-03 NOTE — Anesthesia Preprocedure Evaluation (Signed)
Anesthesia Evaluation  Patient identified by MRN, date of birth, ID band Patient awake    Reviewed: Allergy & Precautions, NPO status , Patient's Chart, lab work & pertinent test results, reviewed documented beta blocker date and time   History of Anesthesia Complications Negative for: history of anesthetic complications  Airway Mallampati: I       Dental  (+) Upper Dentures, Lower Dentures   Pulmonary neg pulmonary ROS, sleep apnea and Continuous Positive Airway Pressure Ventilation , former smoker,    Pulmonary exam normal        Cardiovascular hypertension, Pt. on home beta blockers and Pt. on medications Normal cardiovascular exam     Neuro/Psych negative neurological ROS  negative psych ROS   GI/Hepatic Neg liver ROS, GERD  Medicated,Colon polyp   Endo/Other  diabetes, Well Controlled, Type 2, Oral Hypoglycemic Agents  Renal/GU negative Renal ROS  negative genitourinary   Musculoskeletal  (+) Arthritis , Osteoarthritis,    Abdominal   Peds  Hematology negative hematology ROS (+) anemia ,   Anesthesia Other Findings Past Medical History: No date: Abnormal liver function tests No date: Allergic state No date: Arthritis     Comment: spine, hands, shoulder with previous cuff tear No date: Colon polyp No date: Diabetes mellitus ([place])     Comment: type 2 since 09/16/2011 No date: Elevated transaminase level No date: GERD (gastroesophageal reflux disease) No date: Hiatal hernia No date: Hypercholesterolemia No date: Hypertension No date: Psoriasis No date: Psoriatic arthritis ([place]) No date: Sleep apnea  Reproductive/Obstetrics                             Anesthesia Physical  Anesthesia Plan  ASA: III  Anesthesia Plan: General   Post-op Pain Management:    Induction: Intravenous  Airway Management Planned: Nasal Cannula  Additional Equipment:   Intra-op Plan:    Post-operative Plan: Extubation in OR  Informed Consent: I have reviewed the patients History and Physical, chart, labs and discussed the procedure including the risks, benefits and alternatives for the proposed anesthesia with the patient or authorized representative who has indicated his/her understanding and acceptance.     Plan Discussed with: CRNA and Surgeon  Anesthesia Plan Comments:         Anesthesia Quick Evaluation

## 2015-12-03 NOTE — ED Notes (Signed)
Pt reports last time had to have emergency stretching

## 2015-12-03 NOTE — Progress Notes (Signed)
Transported pt to CT and back to ICU on the vent without incident.

## 2015-12-03 NOTE — ED Triage Notes (Signed)
Hx hiatal hernia and having it stretched X 2. Needs to have it stretched again he reports but feels like everything "hung up" again.  Reports saliva will not pass and this is what happens. Cannot eat or drink because it comes back up. Has had some chest tightness.  Saw bright red blood in vomit as well. Wife reports it was "right much" blood.

## 2015-12-03 NOTE — Transfer of Care (Signed)
Immediate Anesthesia Transfer of Care Note  Patient: Shane Mcknight  Procedure(s) Performed: Procedure(s): FOREIGN BODY REMOVAL (N/A)  Patient Location: ICU  Anesthesia Type:General  Level of Consciousness: sedated and Patient remains intubated per anesthesia plan  Airway & Oxygen Therapy: Patient remains intubated per anesthesia plan and Patient placed on Ventilator (see vital sign flow sheet for setting)  Post-op Assessment: Report given to RN and Post -op Vital signs reviewed and stable  Post vital signs: Reviewed and stable  Last Vitals:  Vitals:   12/03/15 1505 12/03/15 1819  BP: (!) 141/95 (!) 163/91  Pulse:  80  Resp:  18  Temp:  36.8 C    Last Pain:  Vitals:   12/03/15 1819  TempSrc: Tympanic  PainSc:          Complications: No apparent anesthesia complications

## 2015-12-03 NOTE — Anesthesia Postprocedure Evaluation (Signed)
Anesthesia Post Note  Patient: Shane Mcknight  Procedure(s) Performed: Procedure(s) (LRB): FOREIGN BODY REMOVAL (N/A)  Patient location during evaluation: ICU Anesthesia Type: General Level of consciousness: patient remains intubated per anesthesia plan and sedated Pain management: pain level controlled Vital Signs Assessment: post-procedure vital signs reviewed and stable Respiratory status: patient remains intubated per anesthesia plan Cardiovascular status: blood pressure returned to baseline Anesthetic complications: no Comments: Patient left intubated secondary to evaluation of an esophageal clot/ possible esophageal tear.    Last Vitals:  Vitals:   12/03/15 1819 12/03/15 2100  BP: (!) 163/91 (!) 90/54  Pulse: 80 75  Resp: 18 18  Temp: 36.8 C     Last Pain:  Vitals:   12/03/15 1819  TempSrc: Tympanic  PainSc:                  Jerie Basford

## 2015-12-03 NOTE — ED Triage Notes (Signed)
Pt is spitting up saliva in triage.

## 2015-12-04 ENCOUNTER — Encounter: Payer: Self-pay | Admitting: Gastroenterology

## 2015-12-04 ENCOUNTER — Inpatient Hospital Stay: Payer: BLUE CROSS/BLUE SHIELD

## 2015-12-04 DIAGNOSIS — R1310 Dysphagia, unspecified: Secondary | ICD-10-CM

## 2015-12-04 DIAGNOSIS — K922 Gastrointestinal hemorrhage, unspecified: Secondary | ICD-10-CM

## 2015-12-04 LAB — CBC WITH DIFFERENTIAL/PLATELET
Basophils Absolute: 0 10*3/uL (ref 0–0.1)
Basophils Relative: 0 %
EOS ABS: 0.3 10*3/uL (ref 0–0.7)
EOS PCT: 4 %
HCT: 32.9 % — ABNORMAL LOW (ref 40.0–52.0)
HEMOGLOBIN: 11.4 g/dL — AB (ref 13.0–18.0)
LYMPHS ABS: 1.1 10*3/uL (ref 1.0–3.6)
Lymphocytes Relative: 18 %
MCH: 30.1 pg (ref 26.0–34.0)
MCHC: 34.8 g/dL (ref 32.0–36.0)
MCV: 86.5 fL (ref 80.0–100.0)
MONOS PCT: 11 %
Monocytes Absolute: 0.7 10*3/uL (ref 0.2–1.0)
NEUTROS PCT: 67 %
Neutro Abs: 4.2 10*3/uL (ref 1.4–6.5)
Platelets: 140 10*3/uL — ABNORMAL LOW (ref 150–440)
RBC: 3.81 MIL/uL — ABNORMAL LOW (ref 4.40–5.90)
RDW: 14.9 % — ABNORMAL HIGH (ref 11.5–14.5)
WBC: 6.3 10*3/uL (ref 3.8–10.6)

## 2015-12-04 LAB — MAGNESIUM: Magnesium: 2 mg/dL (ref 1.7–2.4)

## 2015-12-04 LAB — CBC
HCT: 33.5 % — ABNORMAL LOW (ref 40.0–52.0)
Hemoglobin: 11.8 g/dL — ABNORMAL LOW (ref 13.0–18.0)
MCH: 30.2 pg (ref 26.0–34.0)
MCHC: 35.3 g/dL (ref 32.0–36.0)
MCV: 85.5 fL (ref 80.0–100.0)
Platelets: 137 10*3/uL — ABNORMAL LOW (ref 150–440)
RBC: 3.91 MIL/uL — ABNORMAL LOW (ref 4.40–5.90)
RDW: 15.3 % — ABNORMAL HIGH (ref 11.5–14.5)
WBC: 7.6 10*3/uL (ref 3.8–10.6)

## 2015-12-04 LAB — PROCALCITONIN

## 2015-12-04 LAB — GLUCOSE, CAPILLARY
GLUCOSE-CAPILLARY: 149 mg/dL — AB (ref 65–99)
GLUCOSE-CAPILLARY: 147 mg/dL — AB (ref 65–99)
Glucose-Capillary: 101 mg/dL — ABNORMAL HIGH (ref 65–99)
Glucose-Capillary: 114 mg/dL — ABNORMAL HIGH (ref 65–99)
Glucose-Capillary: 109 mg/dL — ABNORMAL HIGH (ref 65–99)
Glucose-Capillary: 131 mg/dL — ABNORMAL HIGH (ref 65–99)
Glucose-Capillary: 121 mg/dL — ABNORMAL HIGH (ref 65–99)

## 2015-12-04 LAB — PHOSPHORUS: Phosphorus: 3.2 mg/dL (ref 2.5–4.6)

## 2015-12-04 LAB — BASIC METABOLIC PANEL
ANION GAP: 6 (ref 5–15)
BUN: 10 mg/dL (ref 6–20)
CALCIUM: 7.9 mg/dL — AB (ref 8.9–10.3)
CO2: 24 mmol/L (ref 22–32)
Chloride: 107 mmol/L (ref 101–111)
Creatinine, Ser: 0.94 mg/dL (ref 0.61–1.24)
GFR calc Af Amer: >60 mL/min (ref >60)
GLUCOSE: 162 mg/dL — AB (ref 65–99)
Potassium: 3.2 mmol/L — ABNORMAL LOW (ref 3.5–5.1)
Sodium: 137 mmol/L (ref 135–145)

## 2015-12-04 LAB — LACTIC ACID, PLASMA: Lactic Acid, Venous: 2.4 mmol/L (ref 0.5–1.9)

## 2015-12-04 MED ORDER — MAGNESIUM SULFATE 2 GM/50ML IV SOLN
2.0000 g | Freq: Once | INTRAVENOUS | Status: AC
  Administered 2015-12-04: 2 g via INTRAVENOUS
  Filled 2015-12-04: qty 50

## 2015-12-04 MED ORDER — MIDAZOLAM HCL 2 MG/2ML IJ SOLN
2.0000 mg | Freq: Once | INTRAMUSCULAR | Status: AC
  Administered 2015-12-04: 2 mg via INTRAVENOUS

## 2015-12-04 MED ORDER — POTASSIUM CHLORIDE 10 MEQ/50ML IV SOLN
10.0000 meq | INTRAVENOUS | Status: AC
  Administered 2015-12-04 (×4): 10 meq via INTRAVENOUS
  Filled 2015-12-04 (×4): qty 50

## 2015-12-04 MED ORDER — NOREPINEPHRINE BITARTRATE 1 MG/ML IV SOLN: 2.0000 ug/min | INTRAVENOUS | Status: DC

## 2015-12-04 MED ORDER — SODIUM CHLORIDE 0.9% FLUSH
10.0000 mL | INTRAVENOUS | Status: DC | PRN
Start: 2015-12-04 — End: 2015-12-08

## 2015-12-04 MED ORDER — MIDAZOLAM HCL 2 MG/2ML IJ SOLN
INTRAMUSCULAR | Status: AC
  Administered 2015-12-04: 2 mg via INTRAVENOUS
  Filled 2015-12-04: qty 2

## 2015-12-04 MED ORDER — KCL IN DEXTROSE-NACL 40-5-0.45 MEQ/L-%-% IV SOLN
INTRAVENOUS | Status: DC
  Administered 2015-12-04 – 2015-12-07 (×7): via INTRAVENOUS
  Filled 2015-12-04 (×11): qty 1000

## 2015-12-04 MED ORDER — KCL IN DEXTROSE-NACL 40-5-0.45 MEQ/L-%-% IV SOLN
Freq: Once | INTRAVENOUS | Status: DC
  Filled 2015-12-04: qty 1000

## 2015-12-04 MED ORDER — PHENYLEPHRINE HCL 10 MG/ML IJ SOLN
30.0000 ug/min | INTRAVENOUS | Status: DC
  Administered 2015-12-04: 30 ug/min via INTRAVENOUS
  Administered 2015-12-04: 38 ug/min via INTRAVENOUS
  Filled 2015-12-04 (×2): qty 1

## 2015-12-04 MED ORDER — INSULIN ASPART 100 UNIT/ML ~~LOC~~ SOLN
0.0000 [IU] | SUBCUTANEOUS | Status: DC
  Administered 2015-12-04 – 2015-12-07 (×15): 1 [IU] via SUBCUTANEOUS
  Filled 2015-12-04 (×14): qty 1

## 2015-12-04 MED ORDER — SODIUM CHLORIDE 0.9 % IV BOLUS (SEPSIS): 1000.0000 mL | Freq: Once | INTRAVENOUS | Status: DC

## 2015-12-04 MED ORDER — SODIUM CHLORIDE 0.9% FLUSH
10.0000 mL | Freq: Two times a day (BID) | INTRAVENOUS | Status: DC
  Administered 2015-12-04: 10 mL
  Administered 2015-12-04: 30 mL
  Administered 2015-12-05 – 2015-12-08 (×7): 10 mL

## 2015-12-04 MED ORDER — NOREPINEPHRINE 4 MG/250ML-% IV SOLN
0.0000 ug/min | INTRAVENOUS | Status: DC
  Filled 2015-12-04: qty 250

## 2015-12-04 NOTE — Consult Note (Signed)
Subjective: Patient seen for upper GI bleeding/Mallory-Weiss tear status post esophageal foreign body. Patient currently still on ventilator but is awake. Answers questions well and denies any shortness of breath or abdominal pain or chest pain. Has been no melena or recurrent emesis. hemodynamically he is improved.  Objective: Vital signs in last 24 hours: Temp:  [98.2 F (36.8 C)-99.5 F (37.5 C)] 98.6 F (37 C) (11/20 1200) Pulse Rate:  [48-109] 77 (11/20 1200) Resp:  [15-24] 15 (11/20 1200) BP: (59-192)/(36-161) 125/78 (11/20 1200) SpO2:  [95 %-100 %] 100 % (11/20 1200) FiO2 (%):  [30 %-40 %] 30 % (11/20 1200) Weight:  [87.1 kg (192 lb)-87.1 kg (192 lb 0.3 oz)] 87.1 kg (192 lb 0.3 oz) (11/20 0430) Blood pressure 125/78, pulse 77, temperature 98.6 F (37 C), temperature source Oral, resp. rate 15, height '5\' 8"'  (1.727 m), weight 87.1 kg (192 lb 0.3 oz), SpO2 100 %.   Intake/Output from previous day: 11/19 0701 - 11/20 0700 In: 1566 [I.V.:1316; IV Piggyback:250] Out: 760 [Urine:760]  Intake/Output this shift: Total I/O In: 606.7 [I.V.:606.7] Out: -    General appearance:  65 year old male, intubated, alert, no obvious discomfort. Resp:  Clear to auscultation Cardio:  Regular rate and rhythm GI:  Soft nontender nondistended bowel sounds are positive normoactive Extremities:  No clubbing cyanosis or edema   Lab Results: Results for orders placed or performed during the hospital encounter of 12/03/15 (from the past 24 hour(s))  CBC with Differential     Status: Abnormal   Collection Time: 12/03/15  3:10 PM  Result Value Ref Range   WBC 9.2 3.8 - 10.6 K/uL   RBC 4.85 4.40 - 5.90 MIL/uL   Hemoglobin 14.3 13.0 - 18.0 g/dL   HCT 41.2 40.0 - 52.0 %   MCV 84.9 80.0 - 100.0 fL   MCH 29.4 26.0 - 34.0 pg   MCHC 34.6 32.0 - 36.0 g/dL   RDW 15.0 (H) 11.5 - 14.5 %   Platelets 206 150 - 440 K/uL   Neutrophils Relative % 72 %   Neutro Abs 6.6 (H) 1.4 - 6.5 K/uL   Lymphocytes  Relative 16 %   Lymphs Abs 1.5 1.0 - 3.6 K/uL   Monocytes Relative 7 %   Monocytes Absolute 0.6 0.2 - 1.0 K/uL   Eosinophils Relative 5 %   Eosinophils Absolute 0.5 0 - 0.7 K/uL   Basophils Relative 0 %   Basophils Absolute 0.0 0 - 0.1 K/uL  Comprehensive metabolic panel     Status: Abnormal   Collection Time: 12/03/15  3:10 PM  Result Value Ref Range   Sodium 140 135 - 145 mmol/L   Potassium 3.4 (L) 3.5 - 5.1 mmol/L   Chloride 104 101 - 111 mmol/L   CO2 24 22 - 32 mmol/L   Glucose, Bld 127 (H) 65 - 99 mg/dL   BUN 11 6 - 20 mg/dL   Creatinine, Ser 0.89 0.61 - 1.24 mg/dL   Calcium 9.8 8.9 - 10.3 mg/dL   Total Protein 7.9 6.5 - 8.1 g/dL   Albumin 4.5 3.5 - 5.0 g/dL   AST 33 15 - 41 U/L   ALT 34 17 - 63 U/L   Alkaline Phosphatase 44 38 - 126 U/L   Total Bilirubin 0.8 0.3 - 1.2 mg/dL   GFR calc non Af Amer >60 >60 mL/min   GFR calc Af Amer >60 >60 mL/min   Anion gap 12 5 - 15  Troponin I     Status:  None   Collection Time: 12/03/15  3:10 PM  Result Value Ref Range   Troponin I <0.03 <0.03 ng/mL  Triglycerides     Status: Abnormal   Collection Time: 12/03/15  3:10 PM  Result Value Ref Range   Triglycerides 181 (H) <150 mg/dL  Blood gas, arterial     Status: Abnormal   Collection Time: 12/03/15  8:30 PM  Result Value Ref Range   FIO2 0.40    Delivery systems VENTILATOR    Mode PRESSURE REGULATED VOLUME CONTROL    VT 550 mL   pH, Arterial 7.44 7.350 - 7.450   pCO2 arterial 39 32.0 - 48.0 mmHg   pO2, Arterial 90 83.0 - 108.0 mmHg   Bicarbonate 26.5 20.0 - 28.0 mmol/L   Acid-Base Excess 2.3 (H) 0.0 - 2.0 mmol/L   O2 Saturation 97.3 %   Patient temperature 37.0    Collection site RIGHT RADIAL    Sample type ARTERIAL DRAW    Allens test (pass/fail) PASS PASS   Mechanical Rate 18   MRSA PCR Screening     Status: None   Collection Time: 12/03/15  8:35 PM  Result Value Ref Range   MRSA by PCR NEGATIVE NEGATIVE  CBC     Status: Abnormal   Collection Time: 12/03/15  8:44 PM   Result Value Ref Range   WBC 11.2 (H) 3.8 - 10.6 K/uL   RBC 4.32 (L) 4.40 - 5.90 MIL/uL   Hemoglobin 13.0 13.0 - 18.0 g/dL   HCT 36.7 (L) 40.0 - 52.0 %   MCV 85.0 80.0 - 100.0 fL   MCH 30.0 26.0 - 34.0 pg   MCHC 35.3 32.0 - 36.0 g/dL   RDW 14.8 (H) 11.5 - 14.5 %   Platelets 193 150 - 440 K/uL  Basic metabolic panel     Status: Abnormal   Collection Time: 12/03/15  8:44 PM  Result Value Ref Range   Sodium 140 135 - 145 mmol/L   Potassium 3.2 (L) 3.5 - 5.1 mmol/L   Chloride 104 101 - 111 mmol/L   CO2 25 22 - 32 mmol/L   Glucose, Bld 132 (H) 65 - 99 mg/dL   BUN 11 6 - 20 mg/dL   Creatinine, Ser 0.95 0.61 - 1.24 mg/dL   Calcium 9.1 8.9 - 10.3 mg/dL   GFR calc non Af Amer >60 >60 mL/min   GFR calc Af Amer >60 >60 mL/min   Anion gap 11 5 - 15  Magnesium     Status: Abnormal   Collection Time: 12/03/15  8:44 PM  Result Value Ref Range   Magnesium 1.4 (L) 1.7 - 2.4 mg/dL  Phosphorus     Status: None   Collection Time: 12/03/15  8:44 PM  Result Value Ref Range   Phosphorus 4.1 2.5 - 4.6 mg/dL  Protime-INR     Status: None   Collection Time: 12/03/15  8:44 PM  Result Value Ref Range   Prothrombin Time 14.8 11.4 - 15.2 seconds   INR 1.15   Lactic acid, plasma     Status: None   Collection Time: 12/03/15  8:44 PM  Result Value Ref Range   Lactic Acid, Venous 1.8 0.5 - 1.9 mmol/L  Procalcitonin - Baseline     Status: None   Collection Time: 12/03/15  8:44 PM  Result Value Ref Range   Procalcitonin <0.10 ng/mL  Lactic acid, plasma     Status: Abnormal   Collection Time: 12/03/15 11:54 PM  Result Value  Ref Range   Lactic Acid, Venous 2.4 (HH) 0.5 - 1.9 mmol/L  Glucose, capillary     Status: Abnormal   Collection Time: 12/04/15  4:27 AM  Result Value Ref Range   Glucose-Capillary 149 (H) 65 - 99 mg/dL  Basic metabolic panel     Status: Abnormal   Collection Time: 12/04/15  5:38 AM  Result Value Ref Range   Sodium 137 135 - 145 mmol/L   Potassium 3.2 (L) 3.5 - 5.1 mmol/L    Chloride 107 101 - 111 mmol/L   CO2 24 22 - 32 mmol/L   Glucose, Bld 162 (H) 65 - 99 mg/dL   BUN 10 6 - 20 mg/dL   Creatinine, Ser 0.94 0.61 - 1.24 mg/dL   Calcium 7.9 (L) 8.9 - 10.3 mg/dL   GFR calc non Af Amer >60 >60 mL/min   GFR calc Af Amer >60 >60 mL/min   Anion gap 6 5 - 15  Magnesium     Status: None   Collection Time: 12/04/15  5:38 AM  Result Value Ref Range   Magnesium 2.0 1.7 - 2.4 mg/dL  Phosphorus     Status: None   Collection Time: 12/04/15  5:38 AM  Result Value Ref Range   Phosphorus 3.2 2.5 - 4.6 mg/dL  Procalcitonin     Status: None   Collection Time: 12/04/15  5:38 AM  Result Value Ref Range   Procalcitonin <0.10 ng/mL  Glucose, capillary     Status: Abnormal   Collection Time: 12/04/15  7:19 AM  Result Value Ref Range   Glucose-Capillary 147 (H) 65 - 99 mg/dL  Glucose, capillary     Status: Abnormal   Collection Time: 12/04/15 11:10 AM  Result Value Ref Range   Glucose-Capillary 114 (H) 65 - 99 mg/dL      Recent Labs  12/03/15 1510 12/03/15 2044  WBC 9.2 11.2*  HGB 14.3 13.0  HCT 41.2 36.7*  PLT 206 193   BMET  Recent Labs  12/03/15 1510 12/03/15 2044 12/04/15 0538  NA 140 140 137  K 3.4* 3.2* 3.2*  CL 104 104 107  CO2 '24 25 24  ' GLUCOSE 127* 132* 162*  BUN '11 11 10  ' CREATININE 0.89 0.95 0.94  CALCIUM 9.8 9.1 7.9*   LFT  Recent Labs  12/03/15 1510  PROT 7.9  ALBUMIN 4.5  AST 33  ALT 34  ALKPHOS 44  BILITOT 0.8   PT/INR  Recent Labs  12/03/15 2044  LABPROT 14.8  INR 1.15   Hepatitis Panel No results for input(s): HEPBSAG, HCVAB, HEPAIGM, HEPBIGM in the last 72 hours. C-Diff No results for input(s): CDIFFTOX in the last 72 hours. No results for input(s): CDIFFPCR in the last 72 hours.   Studies/Results: Dg Chest 1 View  Result Date: 12/03/2015 CLINICAL DATA:  Hypoxia EXAM: CHEST 1 VIEW COMPARISON:  Study obtained earlier in the day FINDINGS: Endotracheal tube tip is 1.1 cm above the carina. No pneumothorax.  There is left base atelectasis. Lungs elsewhere are clear. Heart size and pulmonary vascularity are normal. No adenopathy. There is a small hiatal hernia. Stomach is distended with air. IMPRESSION: Endotracheal tube as described without pneumothorax. Left base atelectasis. Lungs elsewhere clear. Small hiatal hernia. Stomach distended with air. Electronically Signed   By: Lowella Grip III M.D.   On: 12/03/2015 20:27   Dg Chest 2 View  Result Date: 12/03/2015 CLINICAL DATA:  Chest tightness. EXAM: CHEST  2 VIEW COMPARISON:  11/23/2015 FINDINGS: Heart and  mediastinal contours are within normal limits. No focal opacities or effusions. No acute bony abnormality. Small hiatal hernia. IMPRESSION: No active cardiopulmonary disease. Electronically Signed   By: Rolm Baptise M.D.   On: 12/03/2015 15:32   Ct Chest W Contrast  Result Date: 12/04/2015 CLINICAL DATA:  Acute onset of dysphagia. Largest adherent clot noted at the gastroesophageal junction, concerning for a large Mallory-Weiss tear or Boerhaave's. Evaluate for perforation. Initial encounter. EXAM: CT CHEST AND ABDOMEN WITH CONTRAST TECHNIQUE: Multidetector CT imaging of the chest and abdomen was performed following the standard protocol during bolus administration of intravenous contrast. CONTRAST:  116m ISOVUE-300 IOPAMIDOL (ISOVUE-300) INJECTION 61% COMPARISON:  MRI of the lumbar spine performed 04/29/2014 FINDINGS: CT CHEST FINDINGS Cardiovascular: Diffuse coronary artery calcifications are seen. The heart remains borderline normal in size. Mild calcification is noted at the aortic arch. The great vessels are grossly unremarkable in appearance. Mediastinum/Nodes: There is no evidence of esophageal perforation. Diffuse Chilton thickening is noted along the mid to distal esophagus and about the gastroesophageal junction, concerning for esophagitis. Mild surrounding soft tissue inflammation is noted. An underlying mass cannot be excluded. No mediastinal  lymphadenopathy is seen. No pericardial effusion is identified. The thyroid gland is unremarkable. No axillary lymphadenopathy is appreciated. The endotracheal tube is seen ending 2 cm above the carina. The endotracheal tube balloon appears mildly overinflated. Lungs/Pleura: Mild patchy bibasilar airspace opacification may reflect atelectasis or possibly mild infection. No pleural effusion or pneumothorax is seen. No dominant mass is identified. Musculoskeletal: No acute osseous abnormalities are identified. The visualized musculature is unremarkable in appearance. CT ABDOMEN PELVIS FINDINGS Hepatobiliary: The liver is unremarkable in appearance. The gallbladder is unremarkable in appearance. The common bile duct remains normal in caliber. Pancreas: The pancreas is within normal limits. Spleen: The spleen is unremarkable in appearance. Adrenals/Urinary Tract: The adrenal glands are unremarkable in appearance. The kidneys are within normal limits. There is no evidence of hydronephrosis. No renal or ureteral stones are identified. Nonspecific perinephric stranding is noted bilaterally. Stomach/Bowel: The stomach is unremarkable in appearance. The visualized portions of the small bowel are within normal limits. The appendix is normal in caliber, without evidence of appendicitis. The visualized portions of the colon are unremarkable in appearance. Vascular/Lymphatic: Mild calcification is noted along the abdominal aorta. No retroperitoneal lymphadenopathy is seen. Other: No additional soft tissue abnormalities are seen. Musculoskeletal: No acute osseous abnormalities are identified. Multilevel vacuum phenomenon is noted along the lower thoracic and lumbar spine. The visualized musculature is unremarkable in appearance. IMPRESSION: 1. No definite evidence of esophageal perforation. Diffuse Whitefield thickening along the mid to distal esophagus and about the gastroesophageal junction, concerning for esophagitis. Mild  surrounding soft tissue inflammation noted. The suspected Mallory-Weiss tear is not well assessed on CT. Underlying mass cannot be excluded. Would correlate with the patient's symptoms. 2. Mild patchy bibasilar airspace opacification may reflect atelectasis or possibly mild infection. 3. Endotracheal tube seen ending 2 cm above the carina. The endotracheal tube balloon appears mildly overinflated. Would deflate slightly as deemed clinically appropriate. 4. Diffuse coronary artery calcifications seen. 5. Mild aortic atherosclerosis. 6. Mild degenerative change along the lower thoracic and lumbar spine. Electronically Signed   By: JGarald BaldingM.D.   On: 12/04/2015 00:16   Ct Abdomen W Contrast  Result Date: 12/04/2015 CLINICAL DATA:  Acute onset of dysphagia. Largest adherent clot noted at the gastroesophageal junction, concerning for a large Mallory-Weiss tear or Boerhaave's. Evaluate for perforation. Initial encounter. EXAM: CT CHEST AND ABDOMEN WITH  CONTRAST TECHNIQUE: Multidetector CT imaging of the chest and abdomen was performed following the standard protocol during bolus administration of intravenous contrast. CONTRAST:  145m ISOVUE-300 IOPAMIDOL (ISOVUE-300) INJECTION 61% COMPARISON:  MRI of the lumbar spine performed 04/29/2014 FINDINGS: CT CHEST FINDINGS Cardiovascular: Diffuse coronary artery calcifications are seen. The heart remains borderline normal in size. Mild calcification is noted at the aortic arch. The great vessels are grossly unremarkable in appearance. Mediastinum/Nodes: There is no evidence of esophageal perforation. Diffuse Cuadros thickening is noted along the mid to distal esophagus and about the gastroesophageal junction, concerning for esophagitis. Mild surrounding soft tissue inflammation is noted. An underlying mass cannot be excluded. No mediastinal lymphadenopathy is seen. No pericardial effusion is identified. The thyroid gland is unremarkable. No axillary lymphadenopathy is  appreciated. The endotracheal tube is seen ending 2 cm above the carina. The endotracheal tube balloon appears mildly overinflated. Lungs/Pleura: Mild patchy bibasilar airspace opacification may reflect atelectasis or possibly mild infection. No pleural effusion or pneumothorax is seen. No dominant mass is identified. Musculoskeletal: No acute osseous abnormalities are identified. The visualized musculature is unremarkable in appearance. CT ABDOMEN PELVIS FINDINGS Hepatobiliary: The liver is unremarkable in appearance. The gallbladder is unremarkable in appearance. The common bile duct remains normal in caliber. Pancreas: The pancreas is within normal limits. Spleen: The spleen is unremarkable in appearance. Adrenals/Urinary Tract: The adrenal glands are unremarkable in appearance. The kidneys are within normal limits. There is no evidence of hydronephrosis. No renal or ureteral stones are identified. Nonspecific perinephric stranding is noted bilaterally. Stomach/Bowel: The stomach is unremarkable in appearance. The visualized portions of the small bowel are within normal limits. The appendix is normal in caliber, without evidence of appendicitis. The visualized portions of the colon are unremarkable in appearance. Vascular/Lymphatic: Mild calcification is noted along the abdominal aorta. No retroperitoneal lymphadenopathy is seen. Other: No additional soft tissue abnormalities are seen. Musculoskeletal: No acute osseous abnormalities are identified. Multilevel vacuum phenomenon is noted along the lower thoracic and lumbar spine. The visualized musculature is unremarkable in appearance. IMPRESSION: 1. No definite evidence of esophageal perforation. Diffuse Walby thickening along the mid to distal esophagus and about the gastroesophageal junction, concerning for esophagitis. Mild surrounding soft tissue inflammation noted. The suspected Mallory-Weiss tear is not well assessed on CT. Underlying mass cannot be  excluded. Would correlate with the patient's symptoms. 2. Mild patchy bibasilar airspace opacification may reflect atelectasis or possibly mild infection. 3. Endotracheal tube seen ending 2 cm above the carina. The endotracheal tube balloon appears mildly overinflated. Would deflate slightly as deemed clinically appropriate. 4. Diffuse coronary artery calcifications seen. 5. Mild aortic atherosclerosis. 6. Mild degenerative change along the lower thoracic and lumbar spine. Electronically Signed   By: JGarald BaldingM.D.   On: 12/04/2015 00:16   Dg Chest Port 1 View  Result Date: 12/04/2015 CLINICAL DATA:  Central line placement. EXAM: PORTABLE CHEST 1 VIEW COMPARISON:  12/04/2015 . FINDINGS: Endotracheal tube, left IJ line in stable position. Cardiomegaly with normal pulmonary vascularity. Low lung volumes with mild bibasilar atelectasis. No pleural effusion or pneumothorax. IMPRESSION: 1. Lines and tubes in stable position. 2. Low lung volumes with mild bibasilar atelectasis. Electronically Signed   By: TMarcello Moores Register   On: 12/04/2015 09:18   Portable Chest Xray  Result Date: 12/04/2015 CLINICAL DATA:  Respiratory failure, history of diabetes, gastroesophageal reflux, remote history of smoking. EXAM: PORTABLE CHEST 1 VIEW COMPARISON:  CT scan of the chest of December 03, 2015 and chest x-ray of the  same date FINDINGS: The right hemidiaphragm is higher than the left. There remains gaseous distention of the stomach. There is patchy density at the left lung base consistent with atelectasis. There is no pleural effusion. The cardiac silhouette is enlarged. The pulmonary vascularity is not engorged. The endotracheal tube tip lies 3.7 cm above the carina. There is mild tortuosity of the descending thoracic aorta. IMPRESSION: Bilateral hypo inflation. Left lower lobe atelectasis or early pneumonia. Cardiomegaly without pulmonary edema. The support tubes are in reasonable position. No significant mediastinal  widening allowing for the hypo inflation. Persistent gaseous distention of the stomach. Electronically Signed   By: David  Martinique M.D.   On: 12/04/2015 07:19    Scheduled Inpatient Medications:   . chlorhexidine gluconate (MEDLINE KIT)  15 mL Mouth Rinse BID  . insulin aspart  0-9 Units Subcutaneous Q4H  . mouth rinse  15 mL Mouth Rinse 10 times per day  . meropenem (MERREM) IV  1 g Intravenous Q8H  . potassium chloride  10 mEq Intravenous Q1 Hr x 4  . sodium chloride  1,000 mL Intravenous Once  . sodium chloride flush  10-40 mL Intracatheter Q12H    Continuous Inpatient Infusions:   . dextrose 5 % and 0.45 % NaCl with KCl 40 mEq/L    . fentaNYL infusion INTRAVENOUS 50 mcg/hr (12/04/15 1146)  . pantoprozole (PROTONIX) infusion 8 mg/hr (12/04/15 0933)  . phenylephrine (NEO-SYNEPHRINE) Adult infusion Stopped (12/04/15 1130)  . propofol (DIPRIVAN) infusion Stopped (12/04/15 1130)    PRN Inpatient Medications:  sodium chloride, acetaminophen, albuterol, bisacodyl, fentaNYL, hydrALAZINE, ondansetron (ZOFRAN) IV, sodium chloride flush  Miscellaneous:   Assessment:  1. Status post upper GI bleed secondary to severe Mallory-Weiss tear. Patient with known history of dysphagia secondary to recurrent ring formation at the bottom of the esophagus. Status post esophageal food impaction with Mallory-Weiss tear occurring during patient attempts to create emesis. He is hemodynamically stable. There is no evidence of ongoing GI bleeding. CBC has not yet been done today but is pending. His BUN has been stable.  Plan:  1. Continue current. It will be important for him to continue on the IV PPI as you are now for at least 72 hours. The pH of the stomach must be kept above 6 to assure clot stability. We'll hold off on oral intake for now, through tomorrow, and at that time may be able start him very slowly with ice chips. He should not be having solid foods for at least 3-4 days. Ventilator management per  intensivist. Following with you.  Lollie Sails MD 12/04/2015, 12:56 PM

## 2015-12-04 NOTE — Progress Notes (Signed)
2310: left for CT with RT and Charge RN [id number]: back in room, pt. Stable, will continue to monitor pt. Closely.

## 2015-12-04 NOTE — Progress Notes (Signed)
Extubated without complications to 2lnc 

## 2015-12-04 NOTE — Progress Notes (Signed)
Spoke with Dr. Lars Mage r/t pt.'s HR trending btwn 46 and 53 with some hypotension.  MD ordered modified instructions for phenyl gtt for a goal MAP of 55.  Instructed to call back if HR sustaining in the 30's.

## 2015-12-04 NOTE — Progress Notes (Signed)
No physician on call listed in [place].  Discussed with Dr. Vira Agar, Dr. Allen Norris, who are not on call. However Dr. Vira Agar confirmed that Dr. Gustavo Lah would be seeing patient today. Family updated.   Marda Stalker, M.D.

## 2015-12-04 NOTE — Progress Notes (Signed)
Chaplain rounded the unit to provide a compassionate presence and support to the patient and family. Patient shook his head affirming that there was nothing we could do for them.Minerva Fester [phone]

## 2015-12-04 NOTE — Progress Notes (Addendum)
2130: Ms. Shane Mane, NP came bedside discussed pt.'s status with family, pt. Became agitated NP ordered 2mg  one time of versed. (see EMR for details) Pt. Continues to be hypotensive on propofol gtt, discussed versed PRN/gtt/fentanyl/ UOP  2200: NP ordered 2L NS bolus.  Pt.'s wife gave consent for central line, but will place if pt. Needs pressors.

## 2015-12-04 NOTE — Progress Notes (Signed)
Paged Ms. Patria Mane, NP r/t pt.'s RASS: +1-+2 with propofol gtt running at max rate. NP ordered Fentanyl gtt, with 100 mcg push to start, will continue to monitor pt. Closely.

## 2015-12-04 NOTE — Procedures (Signed)
Central Venous Catheter Placement: Indication: Patient receiving vesicant or irritant drug.; Patient receiving intravenous therapy for longer than 5 days.; Patient has limited or no vascular access.   Consent:obtained.   Risks and benefits explained in detail including risk of infection, bleeding, respiratory failure and death..   Hand washing performed prior to starting the procedure.   Procedure: An active timeout was performed and correct patient, name, & ID confirmed.  After explaining risk and benefits, patient was positioned correctly for central venous access. Patient was prepped using strict sterile technique including chlorohexadine preps, sterile drape, sterile gown and sterile gloves.  The area was prepped, draped and anesthetized in the usual sterile manner. Patient comfort was obtained.  A triple lumen catheter was placed in left Internal Jugular Vein There was good blood return, catheter caps were placed on lumens, catheter flushed easily, the line was secured and a sterile dressing and BIO-PATCH applied.   Ultrasound was used to visualize vasculature and guidance of needle.   Number of Attempts: 1 Complications:none Estimated Blood Loss: 10 cc Chest Radiograph indicated and ordered.  Operator: Marda Stalker, M.D.   Marda Stalker, M.D.  Pulmonary & Critical Care Medicine Intensive Care Unit

## 2015-12-04 NOTE — Progress Notes (Signed)
Pt. Transferred from [place] with Remigio Eisenmenger, anesthesia; Denyse Amass, RN and Dr. Kayleen Memos bedside.  Pt initially hypertensive, medicated by Dr. Kayleen Memos at bedside.  Pt. Intubated with NS running only. Ms. Patria Mane, NP made aware of admission arrival.  Other VSS, will continue to monitor pt. Closely.

## 2015-12-04 NOTE — Progress Notes (Signed)
Initial Nutrition Assessment  DOCUMENTATION CODES:   Not applicable  INTERVENTION:  -Discussed nutritional poc during ICU rounds. Unable to initiate TF at present due to no access (unable to place NG/OG). Pt does not meet recommended criteria for initiation of TPN at present, possible extubation tomorrow as well. No further recommendations at present. Continue to assess  NUTRITION DIAGNOSIS:   Inadequate oral intake related to acute illness as evidenced by NPO status.  GOAL:   Provide needs based on ASPEN/SCCM guidelines  MONITOR:   Vent status, Labs, Weight trends  REASON FOR ASSESSMENT:   Malnutrition Screening Tool    ASSESSMENT:   65 yo male admitted with foregin body impaction in esophaghous s/p EGD with large adherent clot at GEJ suggestive of large Mallory Weiss tear.   Patient is currently intubated on ventilator support for airway protection MV: 10 L/min Temp (24hrs), Avg:98.9 F (37.2 C), Min:98.2 F (36.8 C), Max:99.5 F (37.5 C)  Propofol: 6.3 ml/hr (166 kcals in 24 hours)  Pt does not have OG or NG, unable to place due to large clot in esophagus and concern for possible bleeding  Labs:potassium 3.2 (supplementing) Meds: D5-1/2 NS with KCl at 100 ml/hr (408 kacls)  Past Medical History:  Diagnosis Date  . Abnormal liver function tests   . Allergic state   . Arthritis    spine, hands, shoulder with previous cuff tear  . Colon polyp   . Diabetes mellitus ([place])    type 2 since 09/16/2011  . Elevated transaminase level   . GERD (gastroesophageal reflux disease)   . Hiatal hernia   . Hypercholesterolemia   . Hypertension   . Psoriasis   . Psoriatic arthritis ([place])   . Sleep apnea      Diet Order:  Diet NPO time specified  Skin:  Reviewed, no issues  Last BM:  no documented BM  Height:   Ht Readings from Last 1 Encounters:  12/03/15 5\' 8"  (1.727 m)    Weight:   Wt Readings from Last 1 Encounters:  12/04/15 192 lb 0.3 oz (87.1 kg)     BMI:  Body mass index is 29.2 kg/m.  Estimated Nutritional Needs:   Kcal:  1911 kcals  Protein:  104-131 g  Fluid:  >/= 2 L   EDUCATION NEEDS:   No education needs identified at this time  [place], [place], [place] [phone] Pager  [phone] Weekend/On-Call Pager

## 2015-12-04 NOTE — Progress Notes (Signed)
PULMONARY / CRITICAL CARE MEDICINE   Name: Shane Mcknight MRN: [id number] DOB: [birth year]    ADMISSION DATE:  12/03/2015   REFERRING MD:  Dr. Michail Sermon  CHIEF COMPLAINT: Difficulty swallowing  HISTORY OF PRESENT ILLNESS:   This is a 65 year old Caucasian male with a past medical history of grade C erosive esophagitis, esophageal ring and dilated dictation, type 2 diabetes, acid reflux, hiatal hernia, obstructive sleep apnea, and hypertension who presented with difficulty swallowing. History is obtained from patient's wife, and ED records. Based on ED records, patient's symptoms started over the past several weeks and gradually got worse to the point where he was unable to swallow effectively. He describes his symptoms as "food getting hung up in his chest". Prior to ED arrival, patient was eating a bite of potato and mid but felt like it got stuck in his chest. He tried dislodging it with some water but started vomiting. He reported vomiting multiple times prior to coming to the ED and emesis was mostly undigested but subsequently became bloody hence, he decided to come to the ED. His last EGD was in May 2017 and showed esophagitis and Candida esophagitis. He takes an aspirin a day but does not take any other NSAIDs or anticoagulants. He had an EGD today that a large adherent clot at the GEJ suggestive of a large Mallory Weiss tear. He is being maintained on full vent support for airway protection pending evaluation for further surgical interventions. PCCM was consulted for ICU and vent management.   SUBJECTIVE: Hypotensive due to need for high doses of sedation overnight. BP improved with low dose neo-synephrine. CTA abdomen and chest completed; no perforation.  VITAL SIGNS: BP (!) 90/54   Pulse (!) 49   Temp 99 F (37.2 C) (Axillary)   Resp 18   Ht 5\' 8"  (1.727 m)   Wt 192 lb 0.3 oz (87.1 kg)   SpO2 99%   BMI 29.20 kg/m   HEMODYNAMICS:    VENTILATOR SETTINGS: Vent Mode: PRVC FiO2  (%):  [30 %-40 %] 30 % Set Rate:  [18 bmp] 18 bmp Vt Set:  [550 mL] 550 mL PEEP:  [5 cmH20] 5 cmH20  INTAKE / OUTPUT: I/O last 3 completed shifts: In: 1566 [I.V.:1316; IV Piggyback:250] Out: 760 [Urine:760]  PHYSICAL EXAMINATION: General:  Acutely ill-looking; older for stated age Neuro: Sedated but awakens to voice and touch; follows basic commands, gag reflex intact HEENT: Head is normocephalic, nontraumatic, ear and nasal passages patent, oral mucosa moist, trachea midline Cardiovascular: RRR, S1, S2, no MRG Lungs: Clear to auscultation bilaterally Abdomen: Distended, soft, normal bowel sounds Musculoskeletal: No joint swelling, no visible deformities, positive range of motion in upper and lower extremities Skin: Warm, dry and no cyanosis  LABS:  BMET  Recent Labs Lab 12/03/15 1510 12/03/15 2044 12/04/15 0538  NA 140 140 137  K 3.4* 3.2* 3.2*  CL 104 104 107  CO2 24 25 24   BUN 11 11 10   CREATININE 0.89 0.95 0.94  GLUCOSE 127* 132* 162*    Electrolytes  Recent Labs Lab 12/03/15 1510 12/03/15 2044 12/04/15 0538  CALCIUM 9.8 9.1 7.9*  MG  --  1.4* 2.0  PHOS  --  4.1 3.2    CBC  Recent Labs Lab 12/03/15 1510 12/03/15 2044  WBC 9.2 11.2*  HGB 14.3 13.0  HCT 41.2 36.7*  PLT 206 193    Coag's  Recent Labs Lab 12/03/15 2044  INR 1.15    Sepsis Markers  Recent Labs  Lab 12/03/15 2044 12/03/15 2354  LATICACIDVEN 1.8 2.4*  PROCALCITON <0.10  --     ABG  Recent Labs Lab 12/03/15 2030  PHART 7.44  PCO2ART 39  PO2ART 90    Liver Enzymes  Recent Labs Lab 12/03/15 1510  AST 33  ALT 34  ALKPHOS 44  BILITOT 0.8  ALBUMIN 4.5    Cardiac Enzymes  Recent Labs Lab 12/03/15 1510  TROPONINI <0.03    Glucose  Recent Labs Lab 12/04/15 0427  GLUCAP 149*    Imaging Dg Chest 1 View  Result Date: 12/03/2015 CLINICAL DATA:  Hypoxia EXAM: CHEST 1 VIEW COMPARISON:  Study obtained earlier in the day FINDINGS: Endotracheal tube  tip is 1.1 cm above the carina. No pneumothorax. There is left base atelectasis. Lungs elsewhere are clear. Heart size and pulmonary vascularity are normal. No adenopathy. There is a small hiatal hernia. Stomach is distended with air. IMPRESSION: Endotracheal tube as described without pneumothorax. Left base atelectasis. Lungs elsewhere clear. Small hiatal hernia. Stomach distended with air. Electronically Signed   By: Lowella Grip III M.D.   On: 12/03/2015 20:27   Dg Chest 2 View  Result Date: 12/03/2015 CLINICAL DATA:  Chest tightness. EXAM: CHEST  2 VIEW COMPARISON:  11/23/2015 FINDINGS: Heart and mediastinal contours are within normal limits. No focal opacities or effusions. No acute bony abnormality. Small hiatal hernia. IMPRESSION: No active cardiopulmonary disease. Electronically Signed   By: Rolm Baptise M.D.   On: 12/03/2015 15:32   Ct Chest W Contrast  Result Date: 12/04/2015 CLINICAL DATA:  Acute onset of dysphagia. Largest adherent clot noted at the gastroesophageal junction, concerning for a large Mallory-Weiss tear or Boerhaave's. Evaluate for perforation. Initial encounter. EXAM: CT CHEST AND ABDOMEN WITH CONTRAST TECHNIQUE: Multidetector CT imaging of the chest and abdomen was performed following the standard protocol during bolus administration of intravenous contrast. CONTRAST:  111mL ISOVUE-300 IOPAMIDOL (ISOVUE-300) INJECTION 61% COMPARISON:  MRI of the lumbar spine performed 04/29/2014 FINDINGS: CT CHEST FINDINGS Cardiovascular: Diffuse coronary artery calcifications are seen. The heart remains borderline normal in size. Mild calcification is noted at the aortic arch. The great vessels are grossly unremarkable in appearance. Mediastinum/Nodes: There is no evidence of esophageal perforation. Diffuse Mccomber thickening is noted along the mid to distal esophagus and about the gastroesophageal junction, concerning for esophagitis. Mild surrounding soft tissue inflammation is noted. An  underlying mass cannot be excluded. No mediastinal lymphadenopathy is seen. No pericardial effusion is identified. The thyroid gland is unremarkable. No axillary lymphadenopathy is appreciated. The endotracheal tube is seen ending 2 cm above the carina. The endotracheal tube balloon appears mildly overinflated. Lungs/Pleura: Mild patchy bibasilar airspace opacification may reflect atelectasis or possibly mild infection. No pleural effusion or pneumothorax is seen. No dominant mass is identified. Musculoskeletal: No acute osseous abnormalities are identified. The visualized musculature is unremarkable in appearance. CT ABDOMEN PELVIS FINDINGS Hepatobiliary: The liver is unremarkable in appearance. The gallbladder is unremarkable in appearance. The common bile duct remains normal in caliber. Pancreas: The pancreas is within normal limits. Spleen: The spleen is unremarkable in appearance. Adrenals/Urinary Tract: The adrenal glands are unremarkable in appearance. The kidneys are within normal limits. There is no evidence of hydronephrosis. No renal or ureteral stones are identified. Nonspecific perinephric stranding is noted bilaterally. Stomach/Bowel: The stomach is unremarkable in appearance. The visualized portions of the small bowel are within normal limits. The appendix is normal in caliber, without evidence of appendicitis. The visualized portions of the colon are unremarkable in  appearance. Vascular/Lymphatic: Mild calcification is noted along the abdominal aorta. No retroperitoneal lymphadenopathy is seen. Other: No additional soft tissue abnormalities are seen. Musculoskeletal: No acute osseous abnormalities are identified. Multilevel vacuum phenomenon is noted along the lower thoracic and lumbar spine. The visualized musculature is unremarkable in appearance. IMPRESSION: 1. No definite evidence of esophageal perforation. Diffuse Lust thickening along the mid to distal esophagus and about the gastroesophageal  junction, concerning for esophagitis. Mild surrounding soft tissue inflammation noted. The suspected Mallory-Weiss tear is not well assessed on CT. Underlying mass cannot be excluded. Would correlate with the patient's symptoms. 2. Mild patchy bibasilar airspace opacification may reflect atelectasis or possibly mild infection. 3. Endotracheal tube seen ending 2 cm above the carina. The endotracheal tube balloon appears mildly overinflated. Would deflate slightly as deemed clinically appropriate. 4. Diffuse coronary artery calcifications seen. 5. Mild aortic atherosclerosis. 6. Mild degenerative change along the lower thoracic and lumbar spine. Electronically Signed   By: Garald Balding M.D.   On: 12/04/2015 00:16   Ct Abdomen W Contrast  Result Date: 12/04/2015 CLINICAL DATA:  Acute onset of dysphagia. Largest adherent clot noted at the gastroesophageal junction, concerning for a large Mallory-Weiss tear or Boerhaave's. Evaluate for perforation. Initial encounter. EXAM: CT CHEST AND ABDOMEN WITH CONTRAST TECHNIQUE: Multidetector CT imaging of the chest and abdomen was performed following the standard protocol during bolus administration of intravenous contrast. CONTRAST:  159mL ISOVUE-300 IOPAMIDOL (ISOVUE-300) INJECTION 61% COMPARISON:  MRI of the lumbar spine performed 04/29/2014 FINDINGS: CT CHEST FINDINGS Cardiovascular: Diffuse coronary artery calcifications are seen. The heart remains borderline normal in size. Mild calcification is noted at the aortic arch. The great vessels are grossly unremarkable in appearance. Mediastinum/Nodes: There is no evidence of esophageal perforation. Diffuse Barz thickening is noted along the mid to distal esophagus and about the gastroesophageal junction, concerning for esophagitis. Mild surrounding soft tissue inflammation is noted. An underlying mass cannot be excluded. No mediastinal lymphadenopathy is seen. No pericardial effusion is identified. The thyroid gland is  unremarkable. No axillary lymphadenopathy is appreciated. The endotracheal tube is seen ending 2 cm above the carina. The endotracheal tube balloon appears mildly overinflated. Lungs/Pleura: Mild patchy bibasilar airspace opacification may reflect atelectasis or possibly mild infection. No pleural effusion or pneumothorax is seen. No dominant mass is identified. Musculoskeletal: No acute osseous abnormalities are identified. The visualized musculature is unremarkable in appearance. CT ABDOMEN PELVIS FINDINGS Hepatobiliary: The liver is unremarkable in appearance. The gallbladder is unremarkable in appearance. The common bile duct remains normal in caliber. Pancreas: The pancreas is within normal limits. Spleen: The spleen is unremarkable in appearance. Adrenals/Urinary Tract: The adrenal glands are unremarkable in appearance. The kidneys are within normal limits. There is no evidence of hydronephrosis. No renal or ureteral stones are identified. Nonspecific perinephric stranding is noted bilaterally. Stomach/Bowel: The stomach is unremarkable in appearance. The visualized portions of the small bowel are within normal limits. The appendix is normal in caliber, without evidence of appendicitis. The visualized portions of the colon are unremarkable in appearance. Vascular/Lymphatic: Mild calcification is noted along the abdominal aorta. No retroperitoneal lymphadenopathy is seen. Other: No additional soft tissue abnormalities are seen. Musculoskeletal: No acute osseous abnormalities are identified. Multilevel vacuum phenomenon is noted along the lower thoracic and lumbar spine. The visualized musculature is unremarkable in appearance. IMPRESSION: 1. No definite evidence of esophageal perforation. Diffuse Feild thickening along the mid to distal esophagus and about the gastroesophageal junction, concerning for esophagitis. Mild surrounding soft tissue inflammation noted. The  suspected Mallory-Weiss tear is not well  assessed on CT. Underlying mass cannot be excluded. Would correlate with the patient's symptoms. 2. Mild patchy bibasilar airspace opacification may reflect atelectasis or possibly mild infection. 3. Endotracheal tube seen ending 2 cm above the carina. The endotracheal tube balloon appears mildly overinflated. Would deflate slightly as deemed clinically appropriate. 4. Diffuse coronary artery calcifications seen. 5. Mild aortic atherosclerosis. 6. Mild degenerative change along the lower thoracic and lumbar spine. Electronically Signed   By: Garald Balding M.D.   On: 12/04/2015 00:16    STUDIES:  EGD for foreign body extraction from the esophagus: Esophagitis withLarge adherent clot at GEJ concerning for a large Mallory Weiss tear vs. Boerhaave's  CULTURES: None  ANTIBIOTICS: Meropenem 12/03/2015  SIGNIFICANT EVENTS: 12/03/2015: ED with acute GI bleed and foreign body impaction in esophagus; EGD for foreign body extraction; ICU for vent management post procedure  LINES/TUBES: PIVs Foley ETT  DISCUSSION: 65 Y/O male with foreign body impaction in the esophagus, acute GI bleed, and esophagitis with possible esophageal tear; now post EGD, and Sedated on full vent support  ASSESSMENT / PLAN:  PULMONARY A: Vent support for airway protection History of sleep apnea Lactic acidosis-lactic acid level trending up, now 2.4 P:   Continue full vent support with current settings. F/u chest x-ray Nebulized bronchodilator as needed for wheezing. ABG as needed IV fluids  Awaiting GI input regarding further procedures. If none then patient can be extubated  CARDIOVASCULAR A:  Hypotension-most likely due to sedating medications History of hypertension and hyperlipidemia P:  Hemodynamic monitoring per ICU protocol. Patient is currently nothing by mouth hold all antihypertensive and hyperlipidemic medications Continue IV fluids and pressors to maintain mean arterial blood pressure greater  than or equal 65 mmHg  RENAL A:   Hypokalemia-resolved Hypomagnesemia-resolved P:   Monitor and correct electrolyte abnormalities.  GASTROINTESTINAL A:   Foreign body impaction in the esophagus, status post EGD and extraction. Acute upper GI bleed Grade C esophagitis on EGD. Rule out esophageal tear History of esophageal stricture P:   Stat CTA chest and abdomen done; GI to review results and communicate further disposition to consulting teams and family. Continue Protonix infusion per GI Monitor for signs and symptoms of bleeding Resumption of oral intake per GI  HEMATOLOGIC A:   No acute issues P:  SCDs for VTE  prophylaxis.  No pharmacologic VTE prophylaxis due to risk of bleeding  INFECTIOUS A:   Mild leukocytosis P:   Meropenem for GI surgical prophylaxis We'll discontinue if WBC continues to trend down  ENDOCRINE A:   Type 2 diabetes   P:   Blood glucose monitoring with sliding scale insulin coverage  Monitor and treat hypoglycemia as patient is currently nothing by mouth  NEUROLOGIC A:   Ventilator associated discomfort P:   RASS goal: 0 to -1 Fentanyl and propofol for sedation and comfort   FAMILY  - Updates: Wife and son updated at bedside; questions answered   - Inter-disciplinary family meet or Palliative Care meeting due by day 7   Case discussed with Dr. Ashby Dawes. Further changes in treatment plan pending clinical course and diagnostics  Total critical care time equal 35 minutes  [facility] ANP-BC Pulmonary and Critical Care Medicine [facility] Pager [phone] or [phone] 12/04/2015, 7:01 AM   Pt seen and examined with NP, agree with assessment and plan.  Lung CTA today, continued hypotension and respiratory failure. Maintain on vent, pending further eval by GI.   -Deep  Ashby Dawes, M.D.

## 2015-12-04 NOTE — Progress Notes (Signed)
[facility] Progress Note Patient Name: Shane Mcknight DOB: 12-23-1950 MRN: [id number]   Date of Service  12/04/2015  HPI/Events of Note  Hypotension due to  heavy sedation  eICU Interventions  Add neosynephrine     Intervention Category Intermediate Interventions: Hypotension - evaluation and management  Simonne Maffucci 12/04/2015, 3:12 AM

## 2015-12-04 NOTE — Progress Notes (Signed)
RN instructed to keep pt. Off sedation as much as possible due to pt.'s hypotension.  Propofol dropped in order to maintain MAP above 65.  Pt. Became awake and agitated, called discussed with NP. Given 2mg  one time dose of versed, NP instructed to still hold last 1L NS bolus until K bag is completed to avoid fluid overload, discussed levo- instructed to hold for now but start at a low dose if MAP drops. Discussed central line, but was instructed to call E-link of other issues/questions with BP arise. Called E-link and spoke with Dr. Lake Bells who ordered phenyl gtt for hypotension d/c'in levo. Will continue to monitor pt. closely

## 2015-12-05 ENCOUNTER — Inpatient Hospital Stay: Payer: BLUE CROSS/BLUE SHIELD

## 2015-12-05 LAB — CBC
HCT: 32.8 % — ABNORMAL LOW (ref 40.0–52.0)
Hemoglobin: 11.7 g/dL — ABNORMAL LOW (ref 13.0–18.0)
MCH: 30.8 pg (ref 26.0–34.0)
MCHC: 35.6 g/dL (ref 32.0–36.0)
MCV: 86.5 fL (ref 80.0–100.0)
PLATELETS: 142 10*3/uL — AB (ref 150–440)
RBC: 3.8 MIL/uL — AB (ref 4.40–5.90)
RDW: 14.9 % — AB (ref 11.5–14.5)
WBC: 6.6 10*3/uL (ref 3.8–10.6)

## 2015-12-05 LAB — BASIC METABOLIC PANEL
Anion gap: 4 — ABNORMAL LOW (ref 5–15)
BUN: 6 mg/dL (ref 6–20)
CALCIUM: 8.1 mg/dL — AB (ref 8.9–10.3)
CO2: 25 mmol/L (ref 22–32)
CREATININE: 0.87 mg/dL (ref 0.61–1.24)
Chloride: 109 mmol/L (ref 101–111)
GFR calc Af Amer: >60 mL/min (ref >60)
GLUCOSE: 143 mg/dL — AB (ref 65–99)
POTASSIUM: 4 mmol/L (ref 3.5–5.1)
SODIUM: 138 mmol/L (ref 135–145)

## 2015-12-05 LAB — PHOSPHORUS: Phosphorus: 2.5 mg/dL (ref 2.5–4.6)

## 2015-12-05 LAB — GLUCOSE, CAPILLARY
Glucose-Capillary: 126 mg/dL — ABNORMAL HIGH (ref 65–99)
Glucose-Capillary: 150 mg/dL — ABNORMAL HIGH (ref 65–99)
Glucose-Capillary: 137 mg/dL — ABNORMAL HIGH (ref 65–99)
Glucose-Capillary: 129 mg/dL — ABNORMAL HIGH (ref 65–99)
Glucose-Capillary: 124 mg/dL — ABNORMAL HIGH (ref 65–99)

## 2015-12-05 LAB — MAGNESIUM: MAGNESIUM: 1.6 mg/dL — AB (ref 1.7–2.4)

## 2015-12-05 LAB — PROCALCITONIN: Procalcitonin: <0.1 ng/mL

## 2015-12-05 MED ORDER — LABETALOL HCL 5 MG/ML IV SOLN
10.0000 mg | INTRAVENOUS | Status: DC | PRN
  Administered 2015-12-05: 20 mg via INTRAVENOUS
  Filled 2015-12-05 (×2): qty 4

## 2015-12-05 MED ORDER — MEROPENEM-SODIUM CHLORIDE 1 GM/50ML IV SOLR
1.0000 g | Freq: Three times a day (TID) | INTRAVENOUS | Status: DC
  Filled 2015-12-05 (×2): qty 50

## 2015-12-05 MED ORDER — MORPHINE SULFATE (PF) 4 MG/ML IV SOLN
1.0000 mg | INTRAVENOUS | Status: DC | PRN
  Administered 2015-12-05 (×2): 2 mg via INTRAVENOUS
  Filled 2015-12-05 (×2): qty 1

## 2015-12-05 MED ORDER — MAGNESIUM SULFATE 4 GM/100ML IV SOLN
4.0000 g | Freq: Once | INTRAVENOUS | Status: AC
  Administered 2015-12-05: 4 g via INTRAVENOUS
  Filled 2015-12-05: qty 100

## 2015-12-05 MED ORDER — METOPROLOL TARTRATE 5 MG/5ML IV SOLN
2.5000 mg | INTRAVENOUS | Status: DC | PRN
  Filled 2015-12-05: qty 5

## 2015-12-05 NOTE — Progress Notes (Addendum)
PULMONARY / CRITICAL CARE MEDICINE   ADDENDUM: Report called to hospitalist service, Dr. Posey Pronto for transfer.   Name: Shane Mcknight MRN: [id number] DOB: Apr 11, 1950    ADMISSION DATE:  12/03/2015   REFERRING MD:  Dr. Michail Sermon  CHIEF COMPLAINT: Difficulty swallowing    SUBJECTIVE:  Extubated yesterday, appears to be doing well, no new complaints.   VITAL SIGNS: BP 140/86 (BP Location: Left Arm)   Pulse 76   Temp 98.4 F (36.9 C) (Oral)   Resp (!) 21   Ht 5\' 8"  (1.727 m)   Wt 196 lb 10.4 oz (89.2 kg)   SpO2 96%   BMI 29.90 kg/m   HEMODYNAMICS:    VENTILATOR SETTINGS: Vent Mode: PRVC FiO2 (%):  [30 %] 30 % Set Rate:  [18 bmp] 18 bmp Vt Set:  [550 mL] 550 mL PEEP:  [5 cmH20] 5 cmH20  INTAKE / OUTPUT: I/O last 3 completed shifts: In: 4575.9 [I.V.:3925.9; IV Piggyback:650] Out: [id number]  PHYSICAL EXAMINATION: General:  Acutely ill-looking; older for stated age Neuro: Sedated but awakens to voice and touch; follows basic commands, gag reflex intact HEENT: Head is normocephalic, nontraumatic, ear and nasal passages patent, oral mucosa moist, trachea midline Cardiovascular: RRR, S1, S2, no MRG Lungs: Clear to auscultation bilaterally Abdomen: Distended, soft, normal bowel sounds Musculoskeletal: No joint swelling, no visible deformities, positive range of motion in upper and lower extremities Skin: Warm, dry and no cyanosis  LABS:  BMET  Recent Labs Lab 12/03/15 2044 12/04/15 0538 12/05/15 0447  NA 140 137 138  K 3.2* 3.2* 4.0  CL 104 107 109  CO2 25 24 25   BUN 11 10 6   CREATININE 0.95 0.94 0.87  GLUCOSE 132* 162* 143*    Electrolytes  Recent Labs Lab 12/03/15 2044 12/04/15 0538 12/05/15 0447  CALCIUM 9.1 7.9* 8.1*  MG 1.4* 2.0 1.6*  PHOS 4.1 3.2 2.5    CBC  Recent Labs Lab 12/04/15 1300 12/04/15 1630 12/05/15 0447  WBC 7.6 6.3 6.6  HGB 11.8* 11.4* 11.7*  HCT 33.5* 32.9* 32.8*  PLT 137* 140* 142*    Coag's  Recent  Labs Lab 12/03/15 2044  INR 1.15    Sepsis Markers  Recent Labs Lab 12/03/15 2044 12/03/15 2354 12/04/15 0538 12/05/15 0447  LATICACIDVEN 1.8 2.4*  --   --   PROCALCITON <0.10  --  <0.10 <0.10    ABG  Recent Labs Lab 12/03/15 2030  PHART 7.44  PCO2ART 39  PO2ART 90    Liver Enzymes  Recent Labs Lab 12/03/15 1510  AST 33  ALT 34  ALKPHOS 44  BILITOT 0.8  ALBUMIN 4.5    Cardiac Enzymes  Recent Labs Lab 12/03/15 1510  TROPONINI <0.03    Glucose  Recent Labs Lab 12/04/15 1110 12/04/15 1703 12/04/15 2011 12/04/15 2323 12/05/15 0354 12/05/15 0730  GLUCAP 114* 109* 131* 121* 126* 150*    Imaging Dg Chest 1 View  Result Date: 12/05/2015 CLINICAL DATA:  Dyspnea. EXAM: CHEST 1 VIEW COMPARISON:  12/04/2015 FINDINGS: Endotracheal tube has been removed. Left jugular catheter remains in place and terminates over the lower SVC. In the cardiac silhouette is upper limits of normal to mildly enlarged, unchanged. Mild left greater than right basilar opacities are unchanged and compatible with atelectasis. There is no evidence of new airspace consolidation, overt pulmonary edema, sizable pleural effusion, or pneumothorax. IMPRESSION: Interval extubation.  Unchanged bibasilar atelectasis. Electronically Signed   By: Logan Bores M.D.   On: 12/05/2015 07:51  STUDIES:  EGD for foreign body extraction from the esophagus: Esophagitis withLarge adherent clot at GEJ concerning for a large Mallory Weiss tear vs. Boerhaave's  CULTURES: None  ANTIBIOTICS: Meropenem 12/03/2015  SIGNIFICANT EVENTS: 12/03/2015: ED with acute GI bleed and foreign body impaction in esophagus; EGD for foreign body extraction; ICU for vent management post procedure  LINES/TUBES: PIVs Foley ETT  DISCUSSION: 65 Y/O male with foreign body impaction in the esophagus, acute GI bleed, and esophagitis with possible esophageal tear; now post EGD, and Sedated on full vent  support  ASSESSMENT / PLAN:  PULMONARY A: Vent support for airway protection History of sleep apnea Lactic acidosis-lactic acid level trending up, now 2.4 P:   Continue full vent support with current settings. F/u chest x-ray Nebulized bronchodilator as needed for wheezing. ABG as needed IV fluids  Awaiting GI input regarding further procedures. If none then patient can be extubated  CARDIOVASCULAR A:  Hypotension-most likely due to sedating medications History of hypertension and hyperlipidemia P:  Hemodynamic monitoring per ICU protocol. Patient is currently nothing by mouth hold all antihypertensive and hyperlipidemic medications Continue IV fluids and pressors to maintain mean arterial blood pressure greater than or equal 65 mmHg  RENAL A:   Hypokalemia-resolved Hypomagnesemia-resolved P:   Monitor and correct electrolyte abnormalities.  GASTROINTESTINAL A:   Foreign body impaction in the esophagus, status post EGD and extraction. Acute upper GI bleed Grade C esophagitis on EGD. Rule out esophageal tear History of esophageal stricture P:   Stat CTA chest and abdomen done; GI to review results and communicate further disposition to consulting teams and family. Continue Protonix infusion per GI Monitor for signs and symptoms of bleeding Resumption of oral intake per GI  HEMATOLOGIC A:   No acute issues P:  SCDs for VTE  prophylaxis.  No pharmacologic VTE prophylaxis due to risk of bleeding  INFECTIOUS A:   Mild leukocytosis P:   Meropenem for GI surgical prophylaxis We'll discontinue if WBC continues to trend down  ENDOCRINE A:   Type 2 diabetes   P:   Blood glucose monitoring with sliding scale insulin coverage  Monitor and treat hypoglycemia as patient is currently nothing by mouth  NEUROLOGIC A:   Ventilator associated discomfort P:   RASS goal: 0 to -1 Fentanyl and propofol for sedation and comfort   FAMILY  - Updates: Wife and son  updated at bedside; questions answered   - Inter-disciplinary family meet or Palliative Care meeting due by day 7    -Marda Stalker, M.D.  12/05/2015   Critical Care Attestation.  I have personally obtained a history, examined the patient, evaluated laboratory and imaging results, formulated the assessment and plan and placed orders. The Patient requires high complexity decision making for assessment and support, frequent evaluation and titration of therapies, application of advanced monitoring technologies and extensive interpretation of multiple databases. The patient has critical illness that could lead imminently to failure of 1 or more organ systems and requires the highest level of physician preparedness to intervene.  Critical Care Time devoted to patient care services described in this note is 35 minutes and is exclusive of time spent in procedures.

## 2015-12-05 NOTE — Progress Notes (Signed)
[id number] Alert, oriented and hungry. Complained of a headache at 1821 and requested Morphine for the pain. Up in chair. Walks around room. Gait steady. VSS. No other complains.

## 2015-12-05 NOTE — Consult Note (Signed)
Subjective: Patient seen for upper GI bleed, Mallory-Weiss tear. Patient was extubated last night. He has done well over the course of the day. He had 1 bowel movement no apparent melena. He denies any abdominal pain. He is still nothing by mouth. Is been no further emesis.  Objective: Vital signs in last 24 hours: Temp:  [98.4 F (36.9 C)-99 F (37.2 C)] 98.4 F (36.9 C) (11/21 0800) Pulse Rate:  [70-101] 84 (11/21 1400) Resp:  [18-29] 26 (11/21 1400) BP: (127-173)/(76-107) 142/84 (11/21 1400) SpO2:  [90 %-100 %] 96 % (11/21 1400) Weight:  [89.2 kg (196 lb 10.4 oz)] 89.2 kg (196 lb 10.4 oz) (11/21 0413) Blood pressure (!) 142/84, pulse 84, temperature 98.4 F (36.9 C), temperature source Oral, resp. rate (!) 26, height 5\' 8"  (1.727 m), weight 89.2 kg (196 lb 10.4 oz), SpO2 96 %.   Intake/Output from previous day: 11/20 0701 - 11/21 0700 In: 3009.9 [I.V.:2609.9; IV Piggyback:400] Out: [id number] [Urine:3575]  Intake/Output this shift: Total I/O In: 591.7 [I.V.:591.7] Out: 850 [Urine:850]   General appearance:  Well-appearing 22 male no distress Resp:  Bilaterally clear to auscultation Cardio:  Regular rate and rhythm without rub or gallop GI:  Soft nontender nondistended bowel sounds positive and normoactive. Extremities:  No clubbing cyanosis or edema   Lab Results: Results for orders placed or performed during the hospital encounter of 12/03/15 (from the past 24 hour(s))  CBC with Differential/Platelet     Status: Abnormal   Collection Time: 12/04/15  4:30 PM  Result Value Ref Range   WBC 6.3 3.8 - 10.6 K/uL   RBC 3.81 (L) 4.40 - 5.90 MIL/uL   Hemoglobin 11.4 (L) 13.0 - 18.0 g/dL   HCT 32.9 (L) 40.0 - 52.0 %   MCV 86.5 80.0 - 100.0 fL   MCH 30.1 26.0 - 34.0 pg   MCHC 34.8 32.0 - 36.0 g/dL   RDW 14.9 (H) 11.5 - 14.5 %   Platelets 140 (L) 150 - 440 K/uL   Neutrophils Relative % 67 %   Neutro Abs 4.2 1.4 - 6.5 K/uL   Lymphocytes Relative 18 %   Lymphs Abs 1.1 1.0 - 3.6  K/uL   Monocytes Relative 11 %   Monocytes Absolute 0.7 0.2 - 1.0 K/uL   Eosinophils Relative 4 %   Eosinophils Absolute 0.3 0 - 0.7 K/uL   Basophils Relative 0 %   Basophils Absolute 0.0 0 - 0.1 K/uL  Glucose, capillary     Status: Abnormal   Collection Time: 12/04/15  5:03 PM  Result Value Ref Range   Glucose-Capillary 109 (H) 65 - 99 mg/dL  Glucose, capillary     Status: Abnormal   Collection Time: 12/04/15  8:11 PM  Result Value Ref Range   Glucose-Capillary 131 (H) 65 - 99 mg/dL  Glucose, capillary     Status: Abnormal   Collection Time: 12/04/15 11:23 PM  Result Value Ref Range   Glucose-Capillary 121 (H) 65 - 99 mg/dL  Glucose, capillary     Status: Abnormal   Collection Time: 12/05/15  3:54 AM  Result Value Ref Range   Glucose-Capillary 126 (H) 65 - 99 mg/dL  Procalcitonin     Status: None   Collection Time: 12/05/15  4:47 AM  Result Value Ref Range   Procalcitonin <0.10 ng/mL  CBC     Status: Abnormal   Collection Time: 12/05/15  4:47 AM  Result Value Ref Range   WBC 6.6 3.8 - 10.6 K/uL   RBC  3.80 (L) 4.40 - 5.90 MIL/uL   Hemoglobin 11.7 (L) 13.0 - 18.0 g/dL   HCT 32.8 (L) 40.0 - 52.0 %   MCV 86.5 80.0 - 100.0 fL   MCH 30.8 26.0 - 34.0 pg   MCHC 35.6 32.0 - 36.0 g/dL   RDW 14.9 (H) 11.5 - 14.5 %   Platelets 142 (L) 150 - 440 K/uL  Basic metabolic panel     Status: Abnormal   Collection Time: 12/05/15  4:47 AM  Result Value Ref Range   Sodium 138 135 - 145 mmol/L   Potassium 4.0 3.5 - 5.1 mmol/L   Chloride 109 101 - 111 mmol/L   CO2 25 22 - 32 mmol/L   Glucose, Bld 143 (H) 65 - 99 mg/dL   BUN 6 6 - 20 mg/dL   Creatinine, Ser 0.87 0.61 - 1.24 mg/dL   Calcium 8.1 (L) 8.9 - 10.3 mg/dL   GFR calc non Af Amer >60 >60 mL/min   GFR calc Af Amer >60 >60 mL/min   Anion gap 4 (L) 5 - 15  Magnesium     Status: Abnormal   Collection Time: 12/05/15  4:47 AM  Result Value Ref Range   Magnesium 1.6 (L) 1.7 - 2.4 mg/dL  Phosphorus     Status: None   Collection Time:  12/05/15  4:47 AM  Result Value Ref Range   Phosphorus 2.5 2.5 - 4.6 mg/dL  Glucose, capillary     Status: Abnormal   Collection Time: 12/05/15  7:30 AM  Result Value Ref Range   Glucose-Capillary 150 (H) 65 - 99 mg/dL  Glucose, capillary     Status: Abnormal   Collection Time: 12/05/15 11:03 AM  Result Value Ref Range   Glucose-Capillary 137 (H) 65 - 99 mg/dL      Recent Labs  12/04/15 1300 12/04/15 1630 12/05/15 0447  WBC 7.6 6.3 6.6  HGB 11.8* 11.4* 11.7*  HCT 33.5* 32.9* 32.8*  PLT 137* 140* 142*   BMET  Recent Labs  12/03/15 2044 12/04/15 0538 12/05/15 0447  NA 140 137 138  K 3.2* 3.2* 4.0  CL 104 107 109  CO2 25 24 25   GLUCOSE 132* 162* 143*  BUN 11 10 6   CREATININE 0.95 0.94 0.87  CALCIUM 9.1 7.9* 8.1*   LFT  Recent Labs  12/03/15 1510  PROT 7.9  ALBUMIN 4.5  AST 33  ALT 34  ALKPHOS 44  BILITOT 0.8   PT/INR  Recent Labs  12/03/15 2044  LABPROT 14.8  INR 1.15   Hepatitis Panel No results for input(s): HEPBSAG, HCVAB, HEPAIGM, HEPBIGM in the last 72 hours. C-Diff No results for input(s): CDIFFTOX in the last 72 hours. No results for input(s): CDIFFPCR in the last 72 hours.   Studies/Results: Dg Chest 1 View  Result Date: 12/05/2015 CLINICAL DATA:  Dyspnea. EXAM: CHEST 1 VIEW COMPARISON:  12/04/2015 FINDINGS: Endotracheal tube has been removed. Left jugular catheter remains in place and terminates over the lower SVC. In the cardiac silhouette is upper limits of normal to mildly enlarged, unchanged. Mild left greater than right basilar opacities are unchanged and compatible with atelectasis. There is no evidence of new airspace consolidation, overt pulmonary edema, sizable pleural effusion, or pneumothorax. IMPRESSION: Interval extubation.  Unchanged bibasilar atelectasis. Electronically Signed   By: Logan Bores M.D.   On: 12/05/2015 07:51   Dg Chest 1 View  Result Date: 12/03/2015 CLINICAL DATA:  Hypoxia EXAM: CHEST 1 VIEW COMPARISON:   Study obtained earlier  in the day FINDINGS: Endotracheal tube tip is 1.1 cm above the carina. No pneumothorax. There is left base atelectasis. Lungs elsewhere are clear. Heart size and pulmonary vascularity are normal. No adenopathy. There is a small hiatal hernia. Stomach is distended with air. IMPRESSION: Endotracheal tube as described without pneumothorax. Left base atelectasis. Lungs elsewhere clear. Small hiatal hernia. Stomach distended with air. Electronically Signed   By: Lowella Grip III M.D.   On: 12/03/2015 20:27   Dg Chest 2 View  Result Date: 12/03/2015 CLINICAL DATA:  Chest tightness. EXAM: CHEST  2 VIEW COMPARISON:  11/23/2015 FINDINGS: Heart and mediastinal contours are within normal limits. No focal opacities or effusions. No acute bony abnormality. Small hiatal hernia. IMPRESSION: No active cardiopulmonary disease. Electronically Signed   By: Rolm Baptise M.D.   On: 12/03/2015 15:32   Ct Chest W Contrast  Result Date: 12/04/2015 CLINICAL DATA:  Acute onset of dysphagia. Largest adherent clot noted at the gastroesophageal junction, concerning for a large Mallory-Weiss tear or Boerhaave's. Evaluate for perforation. Initial encounter. EXAM: CT CHEST AND ABDOMEN WITH CONTRAST TECHNIQUE: Multidetector CT imaging of the chest and abdomen was performed following the standard protocol during bolus administration of intravenous contrast. CONTRAST:  129mL ISOVUE-300 IOPAMIDOL (ISOVUE-300) INJECTION 61% COMPARISON:  MRI of the lumbar spine performed 04/29/2014 FINDINGS: CT CHEST FINDINGS Cardiovascular: Diffuse coronary artery calcifications are seen. The heart remains borderline normal in size. Mild calcification is noted at the aortic arch. The great vessels are grossly unremarkable in appearance. Mediastinum/Nodes: There is no evidence of esophageal perforation. Diffuse Glasby thickening is noted along the mid to distal esophagus and about the gastroesophageal junction, concerning for  esophagitis. Mild surrounding soft tissue inflammation is noted. An underlying mass cannot be excluded. No mediastinal lymphadenopathy is seen. No pericardial effusion is identified. The thyroid gland is unremarkable. No axillary lymphadenopathy is appreciated. The endotracheal tube is seen ending 2 cm above the carina. The endotracheal tube balloon appears mildly overinflated. Lungs/Pleura: Mild patchy bibasilar airspace opacification may reflect atelectasis or possibly mild infection. No pleural effusion or pneumothorax is seen. No dominant mass is identified. Musculoskeletal: No acute osseous abnormalities are identified. The visualized musculature is unremarkable in appearance. CT ABDOMEN PELVIS FINDINGS Hepatobiliary: The liver is unremarkable in appearance. The gallbladder is unremarkable in appearance. The common bile duct remains normal in caliber. Pancreas: The pancreas is within normal limits. Spleen: The spleen is unremarkable in appearance. Adrenals/Urinary Tract: The adrenal glands are unremarkable in appearance. The kidneys are within normal limits. There is no evidence of hydronephrosis. No renal or ureteral stones are identified. Nonspecific perinephric stranding is noted bilaterally. Stomach/Bowel: The stomach is unremarkable in appearance. The visualized portions of the small bowel are within normal limits. The appendix is normal in caliber, without evidence of appendicitis. The visualized portions of the colon are unremarkable in appearance. Vascular/Lymphatic: Mild calcification is noted along the abdominal aorta. No retroperitoneal lymphadenopathy is seen. Other: No additional soft tissue abnormalities are seen. Musculoskeletal: No acute osseous abnormalities are identified. Multilevel vacuum phenomenon is noted along the lower thoracic and lumbar spine. The visualized musculature is unremarkable in appearance. IMPRESSION: 1. No definite evidence of esophageal perforation. Diffuse Caley  thickening along the mid to distal esophagus and about the gastroesophageal junction, concerning for esophagitis. Mild surrounding soft tissue inflammation noted. The suspected Mallory-Weiss tear is not well assessed on CT. Underlying mass cannot be excluded. Would correlate with the patient's symptoms. 2. Mild patchy bibasilar airspace opacification may reflect atelectasis or possibly  mild infection. 3. Endotracheal tube seen ending 2 cm above the carina. The endotracheal tube balloon appears mildly overinflated. Would deflate slightly as deemed clinically appropriate. 4. Diffuse coronary artery calcifications seen. 5. Mild aortic atherosclerosis. 6. Mild degenerative change along the lower thoracic and lumbar spine. Electronically Signed   By: Garald Balding M.D.   On: 12/04/2015 00:16   Ct Abdomen W Contrast  Result Date: 12/04/2015 CLINICAL DATA:  Acute onset of dysphagia. Largest adherent clot noted at the gastroesophageal junction, concerning for a large Mallory-Weiss tear or Boerhaave's. Evaluate for perforation. Initial encounter. EXAM: CT CHEST AND ABDOMEN WITH CONTRAST TECHNIQUE: Multidetector CT imaging of the chest and abdomen was performed following the standard protocol during bolus administration of intravenous contrast. CONTRAST:  142mL ISOVUE-300 IOPAMIDOL (ISOVUE-300) INJECTION 61% COMPARISON:  MRI of the lumbar spine performed 04/29/2014 FINDINGS: CT CHEST FINDINGS Cardiovascular: Diffuse coronary artery calcifications are seen. The heart remains borderline normal in size. Mild calcification is noted at the aortic arch. The great vessels are grossly unremarkable in appearance. Mediastinum/Nodes: There is no evidence of esophageal perforation. Diffuse Memon thickening is noted along the mid to distal esophagus and about the gastroesophageal junction, concerning for esophagitis. Mild surrounding soft tissue inflammation is noted. An underlying mass cannot be excluded. No mediastinal  lymphadenopathy is seen. No pericardial effusion is identified. The thyroid gland is unremarkable. No axillary lymphadenopathy is appreciated. The endotracheal tube is seen ending 2 cm above the carina. The endotracheal tube balloon appears mildly overinflated. Lungs/Pleura: Mild patchy bibasilar airspace opacification may reflect atelectasis or possibly mild infection. No pleural effusion or pneumothorax is seen. No dominant mass is identified. Musculoskeletal: No acute osseous abnormalities are identified. The visualized musculature is unremarkable in appearance. CT ABDOMEN PELVIS FINDINGS Hepatobiliary: The liver is unremarkable in appearance. The gallbladder is unremarkable in appearance. The common bile duct remains normal in caliber. Pancreas: The pancreas is within normal limits. Spleen: The spleen is unremarkable in appearance. Adrenals/Urinary Tract: The adrenal glands are unremarkable in appearance. The kidneys are within normal limits. There is no evidence of hydronephrosis. No renal or ureteral stones are identified. Nonspecific perinephric stranding is noted bilaterally. Stomach/Bowel: The stomach is unremarkable in appearance. The visualized portions of the small bowel are within normal limits. The appendix is normal in caliber, without evidence of appendicitis. The visualized portions of the colon are unremarkable in appearance. Vascular/Lymphatic: Mild calcification is noted along the abdominal aorta. No retroperitoneal lymphadenopathy is seen. Other: No additional soft tissue abnormalities are seen. Musculoskeletal: No acute osseous abnormalities are identified. Multilevel vacuum phenomenon is noted along the lower thoracic and lumbar spine. The visualized musculature is unremarkable in appearance. IMPRESSION: 1. No definite evidence of esophageal perforation. Diffuse Barritt thickening along the mid to distal esophagus and about the gastroesophageal junction, concerning for esophagitis. Mild  surrounding soft tissue inflammation noted. The suspected Mallory-Weiss tear is not well assessed on CT. Underlying mass cannot be excluded. Would correlate with the patient's symptoms. 2. Mild patchy bibasilar airspace opacification may reflect atelectasis or possibly mild infection. 3. Endotracheal tube seen ending 2 cm above the carina. The endotracheal tube balloon appears mildly overinflated. Would deflate slightly as deemed clinically appropriate. 4. Diffuse coronary artery calcifications seen. 5. Mild aortic atherosclerosis. 6. Mild degenerative change along the lower thoracic and lumbar spine. Electronically Signed   By: Garald Balding M.D.   On: 12/04/2015 00:16   Dg Chest Port 1 View  Result Date: 12/04/2015 CLINICAL DATA:  Central line placement. EXAM: PORTABLE CHEST  1 VIEW COMPARISON:  12/04/2015 . FINDINGS: Endotracheal tube, left IJ line in stable position. Cardiomegaly with normal pulmonary vascularity. Low lung volumes with mild bibasilar atelectasis. No pleural effusion or pneumothorax. IMPRESSION: 1. Lines and tubes in stable position. 2. Low lung volumes with mild bibasilar atelectasis. Electronically Signed   By: Marcello Moores  Register   On: 12/04/2015 09:18   Portable Chest Xray  Result Date: 12/04/2015 CLINICAL DATA:  Respiratory failure, history of diabetes, gastroesophageal reflux, remote history of smoking. EXAM: PORTABLE CHEST 1 VIEW COMPARISON:  CT scan of the chest of December 03, 2015 and chest x-ray of the same date FINDINGS: The right hemidiaphragm is higher than the left. There remains gaseous distention of the stomach. There is patchy density at the left lung base consistent with atelectasis. There is no pleural effusion. The cardiac silhouette is enlarged. The pulmonary vascularity is not engorged. The endotracheal tube tip lies 3.7 cm above the carina. There is mild tortuosity of the descending thoracic aorta. IMPRESSION: Bilateral hypo inflation. Left lower lobe atelectasis or  early pneumonia. Cardiomegaly without pulmonary edema. The support tubes are in reasonable position. No significant mediastinal widening allowing for the hypo inflation. Persistent gaseous distention of the stomach. Electronically Signed   By: David  Martinique M.D.   On: 12/04/2015 07:19    Scheduled Inpatient Medications:   . insulin aspart  0-9 Units Subcutaneous Q4H  . magnesium sulfate 1 - 4 g bolus IVPB  4 g Intravenous Once  . sodium chloride  1,000 mL Intravenous Once  . sodium chloride flush  10-40 mL Intracatheter Q12H    Continuous Inpatient Infusions:   . dextrose 5 % and 0.45 % NaCl with KCl 40 mEq/L 100 mL/hr at 12/05/15 0857  . pantoprozole (PROTONIX) infusion 8 mg/hr (12/05/15 0857)    PRN Inpatient Medications:  sodium chloride, acetaminophen, albuterol, bisacodyl, hydrALAZINE, labetalol, metoprolol, morphine injection, ondansetron (ZOFRAN) IV, sodium chloride flush  Miscellaneous:   Assessment:  1. Hematemesis secondary to Mallory-Weiss tear. Patient has been stable now is off the ventilator. There's been no recurrence of bleeding and no evidence of melena.  Plan:  1. I would continue IV PPI as you are for 72 hours from initial start due to the severity of the lesion. Then change to IV 40 mg Protonix twice a day for another day before changing to 40 mg twice a day by mouth. Start some ice chips today and advance to clear liquids non-carbonated non-red tomorrow. Slow advancement through full liquids prior to starting solid foods. We'll need to be seen as an outpatient in 10-14 days and will plan a scope for him in about a month.  Dr Vira Agar to round tomorrow.   Lollie Sails MD 12/05/2015, 2:34 PM

## 2015-12-05 NOTE — Progress Notes (Signed)
Brief Nutrition Note:   Pt s/p extubation this AM. Remains NPO (day 2).  Labs and meds reviewed.  D5-1/2 NS with Kcl at 100 ml/hr Noted plan for slow diet advancement. Recommend nutritional supplement once diet advanced. If unable to advance diet by follow-up, will assess need for nutrition support. Will continue to assess   Kerman Passey MS, [employer], LDN [phone] Pager  [phone] Weekend/On-Call Pager

## 2015-12-06 LAB — BASIC METABOLIC PANEL
Anion gap: 4 — ABNORMAL LOW (ref 5–15)
CHLORIDE: 109 mmol/L (ref 101–111)
CO2: 25 mmol/L (ref 22–32)
CREATININE: 0.82 mg/dL (ref 0.61–1.24)
Calcium: 8.2 mg/dL — ABNORMAL LOW (ref 8.9–10.3)
GFR calc Af Amer: >60 mL/min (ref >60)
GFR calc non Af Amer: >60 mL/min (ref >60)
Glucose, Bld: 132 mg/dL — ABNORMAL HIGH (ref 65–99)
Potassium: 4 mmol/L (ref 3.5–5.1)
Sodium: 138 mmol/L (ref 135–145)

## 2015-12-06 LAB — CBC
HEMATOCRIT: 32.7 % — AB (ref 40.0–52.0)
HEMOGLOBIN: 11.6 g/dL — AB (ref 13.0–18.0)
MCH: 30.8 pg (ref 26.0–34.0)
MCHC: 35.6 g/dL (ref 32.0–36.0)
MCV: 86.6 fL (ref 80.0–100.0)
Platelets: 140 10*3/uL — ABNORMAL LOW (ref 150–440)
RBC: 3.78 MIL/uL — ABNORMAL LOW (ref 4.40–5.90)
RDW: 14.9 % — ABNORMAL HIGH (ref 11.5–14.5)
WBC: 5.4 10*3/uL (ref 3.8–10.6)

## 2015-12-06 LAB — GLUCOSE, CAPILLARY
GLUCOSE-CAPILLARY: 134 mg/dL — AB (ref 65–99)
GLUCOSE-CAPILLARY: 121 mg/dL — AB (ref 65–99)
Glucose-Capillary: 110 mg/dL — ABNORMAL HIGH (ref 65–99)
Glucose-Capillary: 111 mg/dL — ABNORMAL HIGH (ref 65–99)
Glucose-Capillary: 124 mg/dL — ABNORMAL HIGH (ref 65–99)
Glucose-Capillary: 137 mg/dL — ABNORMAL HIGH (ref 65–99)

## 2015-12-06 LAB — CALCIUM, IONIZED: CALCIUM, IONIZED, SERUM: 4.8 mg/dL (ref 4.5–5.6)

## 2015-12-06 MED ORDER — PANTOPRAZOLE SODIUM 40 MG IV SOLR
40.0000 mg | Freq: Two times a day (BID) | INTRAVENOUS | Status: AC
  Administered 2015-12-07 (×2): 40 mg via INTRAVENOUS
  Filled 2015-12-06 (×2): qty 40

## 2015-12-06 MED ORDER — PANTOPRAZOLE SODIUM 40 MG PO TBEC
40.0000 mg | DELAYED_RELEASE_TABLET | Freq: Two times a day (BID) | ORAL | Status: DC
  Administered 2015-12-08: 09:00:00 40 mg via ORAL
  Filled 2015-12-06: qty 1

## 2015-12-06 MED ORDER — MORPHINE SULFATE (PF) 4 MG/ML IV SOLN: 1.0000 mg | INTRAVENOUS | Status: DC | PRN

## 2015-12-06 NOTE — Progress Notes (Signed)
[place] at [facility]                                                                                                                                                                                  Patient Demographics   Shane Mcknight, is a 65 y.o. male, DOB - Oct 15, 1950, [id number]  Admit date - 12/03/2015   Admitting Physician Wilford Corner, MD  Outpatient Primary MD for the patient is Einar Pheasant, MD   LOS - 3  Subjective:patient admitted with food impact patient in he esophagus, had a EGD which showed Mallory-Weiss tear. Patient has been started on water sips. He is feeling better denies any chest pain or shortness of breath     Review of Systems:   CONSTITUTIONAL: No documented fever. No fatigue, weakness. No weight gain, no weight loss.  EYES: No blurry or double vision.  ENT: No tinnitus. No postnasal drip. No redness of the oropharynx.  RESPIRATORY: No cough, no wheeze, no hemoptysis. No dyspnea.  CARDIOVASCULAR: No chest pain. No orthopnea. No palpitations. No syncope.  GASTROINTESTINAL: No nausea, no vomiting or diarrhea. No abdominal pain. No melena or hematochezia.  GENITOURINARY: No dysuria or hematuria.  ENDOCRINE: No polyuria or nocturia. No heat or cold intolerance.  HEMATOLOGY: No anemia. No bruising. No bleeding.  INTEGUMENTARY: No rashes. No lesions.  MUSCULOSKELETAL: No arthritis. No swelling. No gout.  NEUROLOGIC: No numbness, tingling, or ataxia. No seizure-type activity.  PSYCHIATRIC: No anxiety. No insomnia. No ADD.    Vitals:   Vitals:   12/05/15 2000 12/05/15 2126 12/05/15 2219 12/06/15 0439  BP: (!) 166/97 (!) 165/83 (!) 161/84 (!) 161/72  Pulse: 86 82 78 85  Resp: 18 18  18   Temp: 98.1 F (36.7 C) 98.2 F (36.8 C)  98.2 F (36.8 C)  TempSrc: Oral Oral  Oral  SpO2: 98% 97%  98%  Weight:  192 lb 8 oz (87.3 kg)    Height:  5\' 8"  (1.727 m)      Wt Readings from Last 3 Encounters:  12/05/15 192 lb 8 oz  (87.3 kg)  11/21/15 199 lb 9.6 oz (90.5 kg)  08/11/15 196 lb (88.9 kg)     Intake/Output Summary (Last 24 hours) at 12/06/15 1425 Last data filed at 12/06/15 0452  Gross per 24 hour  Intake          2516.25 ml  Output              200 ml  Net          2316.25 ml    Physical Exam:   GENERAL: Pleasant-appearing in no apparent distress.  HEAD, EYES, EARS, NOSE  AND THROAT: Atraumatic, normocephalic. Extraocular muscles are intact. Pupils equal and reactive to light. Sclerae anicteric. No conjunctival injection. No oro-pharyngeal erythema.  NECK: Supple. There is no jugular venous distention. No bruits, no lymphadenopathy, no thyromegaly.  HEART: Regular rate and rhythm,. No murmurs, no rubs, no clicks.  LUNGS: Clear to auscultation bilaterally. No rales or rhonchi. No wheezes.  ABDOMEN: Soft, flat, nontender, nondistended. Has good bowel sounds. No hepatosplenomegaly appreciated.  EXTREMITIES: No evidence of any cyanosis, clubbing, or peripheral edema.  +2 pedal and radial pulses bilaterally.  NEUROLOGIC: The patient is alert, awake, and oriented x3 with no focal motor or sensory deficits appreciated bilaterally.  SKIN: Moist and warm with no rashes appreciated.  Psych: Not anxious, depressed LN: No inguinal LN enlargement    Antibiotics   Anti-infectives    Start     Dose/Rate Route Frequency Ordered Stop   12/05/15 1400  meropenem (MERREM) IVPB SOLR 1 g  Status:  Discontinued     1 g 100 mL/hr over 30 Minutes Intravenous Every 8 hours 12/05/15 0905 12/05/15 1202   12/03/15 2200  meropenem (MERREM) 1 g in sodium chloride 0.9 % 100 mL IVPB  Status:  Discontinued     1 g 200 mL/hr over 30 Minutes Intravenous Every 8 hours 12/03/15 2029 12/05/15 0905      Medications   Scheduled Meds: . insulin aspart  0-9 Units Subcutaneous Q4H  . sodium chloride  1,000 mL Intravenous Once  . sodium chloride flush  10-40 mL Intracatheter Q12H   Continuous Infusions: . dextrose 5 % and  0.45 % NaCl with KCl 40 mEq/L 100 mL/hr at 12/06/15 0434  . pantoprozole (PROTONIX) infusion 8 mg/hr (12/06/15 0331)   PRN Meds:.sodium chloride, acetaminophen, albuterol, bisacodyl, hydrALAZINE, labetalol, metoprolol, morphine injection, ondansetron (ZOFRAN) IV, sodium chloride flush   Data Review:   Micro Results Recent Results (from the past 240 hour(s))  MRSA PCR Screening     Status: None   Collection Time: 12/03/15  8:35 PM  Result Value Ref Range Status   MRSA by PCR NEGATIVE NEGATIVE Final    Comment:        The GeneXpert MRSA Assay (FDA approved for NASAL specimens only), is one component of a comprehensive MRSA colonization surveillance program. It is not intended to diagnose MRSA infection nor to guide or monitor treatment for MRSA infections.     Radiology Reports Dg Chest 1 View  Result Date: 12/05/2015 CLINICAL DATA:  Dyspnea. EXAM: CHEST 1 VIEW COMPARISON:  12/04/2015 FINDINGS: Endotracheal tube has been removed. Left jugular catheter remains in place and terminates over the lower SVC. In the cardiac silhouette is upper limits of normal to mildly enlarged, unchanged. Mild left greater than right basilar opacities are unchanged and compatible with atelectasis. There is no evidence of new airspace consolidation, overt pulmonary edema, sizable pleural effusion, or pneumothorax. IMPRESSION: Interval extubation.  Unchanged bibasilar atelectasis. Electronically Signed   By: Logan Bores M.D.   On: 12/05/2015 07:51   Dg Chest 1 View  Result Date: 12/03/2015 CLINICAL DATA:  Hypoxia EXAM: CHEST 1 VIEW COMPARISON:  Study obtained earlier in the day FINDINGS: Endotracheal tube tip is 1.1 cm above the carina. No pneumothorax. There is left base atelectasis. Lungs elsewhere are clear. Heart size and pulmonary vascularity are normal. No adenopathy. There is a small hiatal hernia. Stomach is distended with air. IMPRESSION: Endotracheal tube as described without pneumothorax. Left  base atelectasis. Lungs elsewhere clear. Small hiatal hernia. Stomach distended with air.  Electronically Signed   By: Lowella Grip III M.D.   On: 12/03/2015 20:27   Dg Chest 2 View  Result Date: 12/03/2015 CLINICAL DATA:  Chest tightness. EXAM: CHEST  2 VIEW COMPARISON:  11/23/2015 FINDINGS: Heart and mediastinal contours are within normal limits. No focal opacities or effusions. No acute bony abnormality. Small hiatal hernia. IMPRESSION: No active cardiopulmonary disease. Electronically Signed   By: Rolm Baptise M.D.   On: 12/03/2015 15:32   Dg Chest 2 View  Result Date: 11/23/2015 CLINICAL DATA:  Cough EXAM: CHEST  2 VIEW COMPARISON:  March 21, 2006 FINDINGS: There is no edema or consolidation. The heart size and pulmonary vascularity are normal. No adenopathy. There is arthropathy in each shoulder region. There is atherosclerotic calcification in the aorta. IMPRESSION: Aortic atherosclerosis.  No edema or consolidation. Electronically Signed   By: Lowella Grip III M.D.   On: 11/23/2015 16:12   Ct Chest W Contrast  Result Date: 12/04/2015 CLINICAL DATA:  Acute onset of dysphagia. Largest adherent clot noted at the gastroesophageal junction, concerning for a large Mallory-Weiss tear or Boerhaave's. Evaluate for perforation. Initial encounter. EXAM: CT CHEST AND ABDOMEN WITH CONTRAST TECHNIQUE: Multidetector CT imaging of the chest and abdomen was performed following the standard protocol during bolus administration of intravenous contrast. CONTRAST:  133mL ISOVUE-300 IOPAMIDOL (ISOVUE-300) INJECTION 61% COMPARISON:  MRI of the lumbar spine performed 04/29/2014 FINDINGS: CT CHEST FINDINGS Cardiovascular: Diffuse coronary artery calcifications are seen. The heart remains borderline normal in size. Mild calcification is noted at the aortic arch. The great vessels are grossly unremarkable in appearance. Mediastinum/Nodes: There is no evidence of esophageal perforation. Diffuse Quesenberry thickening is  noted along the mid to distal esophagus and about the gastroesophageal junction, concerning for esophagitis. Mild surrounding soft tissue inflammation is noted. An underlying mass cannot be excluded. No mediastinal lymphadenopathy is seen. No pericardial effusion is identified. The thyroid gland is unremarkable. No axillary lymphadenopathy is appreciated. The endotracheal tube is seen ending 2 cm above the carina. The endotracheal tube balloon appears mildly overinflated. Lungs/Pleura: Mild patchy bibasilar airspace opacification may reflect atelectasis or possibly mild infection. No pleural effusion or pneumothorax is seen. No dominant mass is identified. Musculoskeletal: No acute osseous abnormalities are identified. The visualized musculature is unremarkable in appearance. CT ABDOMEN PELVIS FINDINGS Hepatobiliary: The liver is unremarkable in appearance. The gallbladder is unremarkable in appearance. The common bile duct remains normal in caliber. Pancreas: The pancreas is within normal limits. Spleen: The spleen is unremarkable in appearance. Adrenals/Urinary Tract: The adrenal glands are unremarkable in appearance. The kidneys are within normal limits. There is no evidence of hydronephrosis. No renal or ureteral stones are identified. Nonspecific perinephric stranding is noted bilaterally. Stomach/Bowel: The stomach is unremarkable in appearance. The visualized portions of the small bowel are within normal limits. The appendix is normal in caliber, without evidence of appendicitis. The visualized portions of the colon are unremarkable in appearance. Vascular/Lymphatic: Mild calcification is noted along the abdominal aorta. No retroperitoneal lymphadenopathy is seen. Other: No additional soft tissue abnormalities are seen. Musculoskeletal: No acute osseous abnormalities are identified. Multilevel vacuum phenomenon is noted along the lower thoracic and lumbar spine. The visualized musculature is unremarkable in  appearance. IMPRESSION: 1. No definite evidence of esophageal perforation. Diffuse Lazo thickening along the mid to distal esophagus and about the gastroesophageal junction, concerning for esophagitis. Mild surrounding soft tissue inflammation noted. The suspected Mallory-Weiss tear is not well assessed on CT. Underlying mass cannot be excluded. Would correlate with  the patient's symptoms. 2. Mild patchy bibasilar airspace opacification may reflect atelectasis or possibly mild infection. 3. Endotracheal tube seen ending 2 cm above the carina. The endotracheal tube balloon appears mildly overinflated. Would deflate slightly as deemed clinically appropriate. 4. Diffuse coronary artery calcifications seen. 5. Mild aortic atherosclerosis. 6. Mild degenerative change along the lower thoracic and lumbar spine. Electronically Signed   By: Garald Balding M.D.   On: 12/04/2015 00:16   Ct Abdomen W Contrast  Result Date: 12/04/2015 CLINICAL DATA:  Acute onset of dysphagia. Largest adherent clot noted at the gastroesophageal junction, concerning for a large Mallory-Weiss tear or Boerhaave's. Evaluate for perforation. Initial encounter. EXAM: CT CHEST AND ABDOMEN WITH CONTRAST TECHNIQUE: Multidetector CT imaging of the chest and abdomen was performed following the standard protocol during bolus administration of intravenous contrast. CONTRAST:  122mL ISOVUE-300 IOPAMIDOL (ISOVUE-300) INJECTION 61% COMPARISON:  MRI of the lumbar spine performed 04/29/2014 FINDINGS: CT CHEST FINDINGS Cardiovascular: Diffuse coronary artery calcifications are seen. The heart remains borderline normal in size. Mild calcification is noted at the aortic arch. The great vessels are grossly unremarkable in appearance. Mediastinum/Nodes: There is no evidence of esophageal perforation. Diffuse Sk[place]ron thickening is noted along the mid to distal esophagus and about the gastroesophageal junction, concerning for esophagitis. Mild surrounding soft tissue  inflammation is noted. An underlying mass cannot be excluded. No mediastinal lymphadenopathy is seen. No pericardial effusion is identified. The thyroid gland is unremarkable. No axillary lymphadenopathy is appreciated. The endotracheal tube is seen ending 2 cm above the carina. The endotracheal tube balloon appears mildly overinflated. Lungs/Pleura: Mild patchy bibasilar airspace opacification may reflect atelectasis or possibly mild infection. No pleural effusion or pneumothorax is seen. No dominant mass is identified. Musculoskeletal: No acute osseous abnormalities are identified. The visualized musculature is unremarkable in appearance. CT ABDOMEN PELVIS FINDINGS Hepatobiliary: The liver is unremarkable in appearance. The gallbladder is unremarkable in appearance. The common bile duct remains normal in caliber. Pancreas: The pancreas is within normal limits. Spleen: The spleen is unremarkable in appearance. Adrenals/Urinary Tract: The adrenal glands are unremarkable in appearance. The kidneys are within normal limits. There is no evidence of hydronephrosis. No renal or ureteral stones are identified. Nonspecific perinephric stranding is noted bilaterally. Stomach/Bowel: The stomach is unremarkable in appearance. The visualized portions of the small bowel are within normal limits. The appendix is normal in caliber, without evidence of appendicitis. The visualized portions of the colon are unremarkable in appearance. Vascular/Lymphatic: Mild calcification is noted along the abdominal aorta. No retroperitoneal lymphadenopathy is seen. Other: No additional soft tissue abnormalities are seen. Musculoskeletal: No acute osseous abnormalities are identified. Multilevel vacuum phenomenon is noted along the lower thoracic and lumbar spine. The visualized musculature is unremarkable in appearance. IMPRESSION: 1. No definite evidence of esophageal perforation. Diffuse Henshaw thickening along the mid to distal esophagus and  about the gastroesophageal junction, concerning for esophagitis. Mild surrounding soft tissue inflammation noted. The suspected Mallory-Weiss tear is not well assessed on CT. Underlying mass cannot be excluded. Would correlate with the patient's symptoms. 2. Mild patchy bibasilar airspace opacification may reflect atelectasis or possibly mild infection. 3. Endotracheal tube seen ending 2 cm above the carina. The endotracheal tube balloon appears mildly overinflated. Would deflate slightly as deemed clinically appropriate. 4. Diffuse coronary artery calcifications seen. 5. Mild aortic atherosclerosis. 6. Mild degenerative change along the lower thoracic and lumbar spine. Electronically Signed   By: Garald Balding M.D.   On: 12/04/2015 00:16   Dg Chest Port 1  View  Result Date: 12/04/2015 CLINICAL DATA:  Central line placement. EXAM: PORTABLE CHEST 1 VIEW COMPARISON:  12/04/2015 . FINDINGS: Endotracheal tube, left IJ line in stable position. Cardiomegaly with normal pulmonary vascularity. Low lung volumes with mild bibasilar atelectasis. No pleural effusion or pneumothorax. IMPRESSION: 1. Lines and tubes in stable position. 2. Low lung volumes with mild bibasilar atelectasis. Electronically Signed   By: Marcello Moores  Register   On: 12/04/2015 09:18   Portable Chest Xray  Result Date: 12/04/2015 CLINICAL DATA:  Respiratory failure, history of diabetes, gastroesophageal reflux, remote history of smoking. EXAM: PORTABLE CHEST 1 VIEW COMPARISON:  CT scan of the chest of December 03, 2015 and chest x-ray of the same date FINDINGS: The right hemidiaphragm is higher than the left. There remains gaseous distention of the stomach. There is patchy density at the left lung base consistent with atelectasis. There is no pleural effusion. The cardiac silhouette is enlarged. The pulmonary vascularity is not engorged. The endotracheal tube tip lies 3.7 cm above the carina. There is mild tortuosity of the descending thoracic  aorta. IMPRESSION: Bilateral hypo inflation. Left lower lobe atelectasis or early pneumonia. Cardiomegaly without pulmonary edema. The support tubes are in reasonable position. No significant mediastinal widening allowing for the hypo inflation. Persistent gaseous distention of the stomach. Electronically Signed   By: David  Martinique M.D.   On: 12/04/2015 07:19     CBC  Recent Labs Lab 12/03/15 1510 12/03/15 2044 12/04/15 1300 12/04/15 1630 12/05/15 0447 12/06/15 0537  WBC 9.2 11.2* 7.6 6.3 6.6 5.4  HGB 14.3 13.0 11.8* 11.4* 11.7* 11.6*  HCT 41.2 36.7* 33.5* 32.9* 32.8* 32.7*  PLT 206 193 137* 140* 142* 140*  MCV 84.9 85.0 85.5 86.5 86.5 86.6  MCH 29.4 30.0 30.2 30.1 30.8 30.8  MCHC 34.6 35.3 35.3 34.8 35.6 35.6  RDW 15.0* 14.8* 15.3* 14.9* 14.9* 14.9*  LYMPHSABS 1.5  --   --  1.1  --   --   MONOABS 0.6  --   --  0.7  --   --   EOSABS 0.5  --   --  0.3  --   --   BASOSABS 0.0  --   --  0.0  --   --     Chemistries   Recent Labs Lab 12/03/15 1510 12/03/15 2044 12/04/15 0538 12/05/15 0447 12/06/15 0537  NA 140 140 137 138 138  K 3.4* 3.2* 3.2* 4.0 4.0  CL 104 104 107 109 109  CO2 24 25 24 25 25   GLUCOSE 127* 132* 162* 143* 132*  BUN 11 11 10 6  <5*  CREATININE 0.89 0.95 0.94 0.87 0.82  CALCIUM 9.8 9.1 7.9* 8.1* 8.2*  MG  --  1.4* 2.0 1.6*  --   AST 33  --   --   --   --   ALT 34  --   --   --   --   ALKPHOS 44  --   --   --   --   BILITOT 0.8  --   --   --   --    ------------------------------------------------------------------------------------------------------------------ estimated creatinine clearance is 94 mL/min (by C-G formula based on SCr of 0.82 mg/dL). ------------------------------------------------------------------------------------------------------------------ No results for input(s): HGBA1C in the last 72 hours. ------------------------------------------------------------------------------------------------------------------  Recent Labs   12/03/15 1510  TRIG 181*   ------------------------------------------------------------------------------------------------------------------ No results for input(s): TSH, T4TOTAL, T3FREE, THYROIDAB in the last 72 hours.  Invalid input(s): FREET3 ------------------------------------------------------------------------------------------------------------------ No results for input(s): VITAMINB12, FOLATE, FERRITIN, TIBC,  IRON, RETICCTPCT in the last 72 hours.  Coagulation profile  Recent Labs Lab 12/03/15 2044  INR 1.15    No results for input(s): DDIMER in the last 72 hours.  Cardiac Enzymes  Recent Labs Lab 12/03/15 1510  TROPONINI <0.03   ------------------------------------------------------------------------------------------------------------------ Invalid input(s): POCBNP    Assessment & Plan  Patient is a 65 year old admitted with food impact esopahgus  1. Mallory-Weiss tear Supportive care Continue PPIs GI is following the patient Advance diet as tolerated  2. Essential hypertention Continue there medications'll restart his home medications if ok with Gi  3.Hyperlipidemia Currently oral Medications on hold  4. DMII  We'll place on sliding scale   5. Miscellaneous  SCDs for DVT prophylaxis        Code Status Orders        Start     Ordered   12/03/15 2009  Full code  Continuous     12/03/15 2013    Code Status History    Date Active Date Inactive Code Status Order ID Comments User Context   This patient has a current code status but no historical code status.    Advance Directive Documentation   Flowsheet Row Most Recent Value  Type of Advance Directive  -- [wife believes we have a copy at [facility], will pass along to day]  Pre-existing out of facility DNR order (yellow form or pink MOST form)  No data  "MOST" Form in Place?  No data           Consults GI   DVT Prophylaxis  SCDs  Lab Results  Component Value Date   PLT 140  (L) 12/06/2015     Time Spent in minutes   35 minutes  Greater than 50% of time spent in care coordination and counseling patient regarding the condition and plan of care.   Dustin Flock M.D on 12/06/2015 at 2:25 PM  Between 7am to 6pm - Pager - [phone]  After 6pm go to [email or web address] - password [email or web address] [facility]   Office  [phone]

## 2015-12-06 NOTE — Consult Note (Signed)
Will go to clear liquids tomorrow but no carbonation drinks.  Will wait til tomorrow and have Hospitalist or GI on call to order this.

## 2015-12-06 NOTE — Consult Note (Signed)
Patient seen in room, has IJ line in, has been NPO since admission. Was intubated but has been extubated. He denies any epigastric pain or chest pain.  No vomiting since extubated.  VSS, afebrile.  Chest clear, heart RRR, abd soft and non tender.  His EGD showed a very large Mallory Weiss esophageal tear. Hgb stable.  Will start water now and advance slowly to clear liquids today and tomorrow and maybe full liquids Friday.  I am on today and Dr. Vicente Males will be on tomorrow.

## 2015-12-07 DIAGNOSIS — K226 Gastro-esophageal laceration-hemorrhage syndrome: Secondary | ICD-10-CM

## 2015-12-07 LAB — CBC
HEMATOCRIT: 34.8 % — AB (ref 40.0–52.0)
HEMOGLOBIN: 12.3 g/dL — AB (ref 13.0–18.0)
MCH: 30 pg (ref 26.0–34.0)
MCHC: 35.2 g/dL (ref 32.0–36.0)
MCV: 85.1 fL (ref 80.0–100.0)
Platelets: 167 10*3/uL (ref 150–440)
RBC: 4.09 MIL/uL — AB (ref 4.40–5.90)
RDW: 14.6 % — ABNORMAL HIGH (ref 11.5–14.5)
WBC: 5.7 10*3/uL (ref 3.8–10.6)

## 2015-12-07 LAB — GLUCOSE, CAPILLARY
GLUCOSE-CAPILLARY: 126 mg/dL — AB (ref 65–99)
GLUCOSE-CAPILLARY: 103 mg/dL — AB (ref 65–99)
GLUCOSE-CAPILLARY: 122 mg/dL — AB (ref 65–99)
Glucose-Capillary: 115 mg/dL — ABNORMAL HIGH (ref 65–99)
Glucose-Capillary: 117 mg/dL — ABNORMAL HIGH (ref 65–99)
Glucose-Capillary: 119 mg/dL — ABNORMAL HIGH (ref 65–99)

## 2015-12-07 MED ORDER — AZITHROMYCIN 500 MG PO TABS
500.0000 mg | ORAL_TABLET | Freq: Every day | ORAL | Status: DC
  Administered 2015-12-07 – 2015-12-08 (×2): 500 mg via ORAL
  Filled 2015-12-07 (×2): qty 1

## 2015-12-07 MED ORDER — HYDROCHLOROTHIAZIDE 25 MG PO TABS
25.0000 mg | ORAL_TABLET | Freq: Every day | ORAL | Status: DC
  Administered 2015-12-07 – 2015-12-08 (×2): 25 mg via ORAL
  Filled 2015-12-07 (×2): qty 1

## 2015-12-07 MED ORDER — OMEGA-3 FATTY ACIDS 1000 MG PO CAPS
2.0000 g | ORAL_CAPSULE | Freq: Every day | ORAL | Status: DC
  Administered 2015-12-08: 2 g via ORAL
  Filled 2015-12-07 (×4): qty 2

## 2015-12-07 MED ORDER — ATENOLOL 50 MG PO TABS
50.0000 mg | ORAL_TABLET | Freq: Two times a day (BID) | ORAL | Status: DC
  Administered 2015-12-07 – 2015-12-08 (×3): 50 mg
  Filled 2015-12-07 (×5): qty 1

## 2015-12-07 MED ORDER — GABAPENTIN 300 MG PO CAPS
300.0000 mg | ORAL_CAPSULE | Freq: Three times a day (TID) | ORAL | Status: DC
  Administered 2015-12-07 – 2015-12-08 (×3): 300 mg via ORAL
  Filled 2015-12-07 (×3): qty 1

## 2015-12-07 MED ORDER — FOLIC ACID 1 MG PO TABS
1.0000 mg | ORAL_TABLET | Freq: Every day | ORAL | Status: DC
  Administered 2015-12-07 – 2015-12-08 (×2): 1 mg via ORAL
  Filled 2015-12-07 (×2): qty 1

## 2015-12-07 MED ORDER — GUAIFENESIN-DM 100-10 MG/5ML PO SYRP
15.0000 mL | ORAL_SOLUTION | ORAL | Status: DC | PRN
  Administered 2015-12-07: 12:00:00 15 mL via ORAL
  Filled 2015-12-07 (×2): qty 15

## 2015-12-07 MED ORDER — GUAIFENESIN ER 600 MG PO TB12
600.0000 mg | ORAL_TABLET | Freq: Two times a day (BID) | ORAL | Status: DC
  Administered 2015-12-07 – 2015-12-08 (×3): 600 mg via ORAL
  Filled 2015-12-07 (×3): qty 1

## 2015-12-07 NOTE — Progress Notes (Signed)
Jonathon Bellows MD [address], [place], [place] [address] Phone: [phone] Fax : [phone]  Shane Mcknight is being followed for mallory weiss tear  Day 4 of follow up   Subjective:   Doing well , wants to eat , no further bleeding    Objective: Vital signs in last 24 hours: Vitals:   12/06/15 0439 12/06/15 1554 12/06/15 2033 12/07/15 0428  BP: (!) 161/72 (!) 143/91 (!) 165/98 (!) 168/89  Pulse: 85 83 86 79  Resp: 18 20 20  (!) 23  Temp: 98.2 F (36.8 C) 98.8 F (37.1 C) 98.1 F (36.7 C) 98.2 F (36.8 C)  TempSrc: Oral Oral Oral Oral  SpO2: 98% 96% 98% 100%  Weight:      Height:       Weight change:   Intake/Output Summary (Last 24 hours) at 12/07/15 1031 Last data filed at 12/07/15 0305  Gross per 24 hour  Intake           2752.5 ml  Output                0 ml  Net           2752.5 ml     Exam: Heart:: Regular rate and rhythm Lungs: normal Abdomen: soft, nontender, normal bowel sounds   Lab Results: CBC Latest Ref Rng & Units 12/07/2015 12/06/2015 12/05/2015  WBC 3.8 - 10.6 K/uL 5.7 5.4 6.6  Hemoglobin 13.0 - 18.0 g/dL 12.3(L) 11.6(L) 11.7(L)  Hematocrit 40.0 - 52.0 % 34.8(L) 32.7(L) 32.8(L)  Platelets 150 - 440 K/uL 167 140(L) 142(L)    BMP Latest Ref Rng & Units 12/06/2015 12/05/2015 12/04/2015  Glucose 65 - 99 mg/dL 132(H) 143(H) 162(H)  BUN 6 - 20 mg/dL <5(L) 6 10  Creatinine 0.61 - 1.24 mg/dL 0.82 0.87 0.94  Sodium 135 - 145 mmol/L 138 138 137  Potassium 3.5 - 5.1 mmol/L 4.0 4.0 3.2(L)  Chloride 101 - 111 mmol/L 109 109 107  CO2 22 - 32 mmol/L 25 25 24   Calcium 8.9 - 10.3 mg/dL 8.2(L) 8.1(L) 7.9(L)    Micro Results: Recent Results (from the past 240 hour(s))  MRSA PCR Screening     Status: None   Collection Time: 12/03/15  8:35 PM  Result Value Ref Range Status   MRSA by PCR NEGATIVE NEGATIVE Final    Comment:        The GeneXpert MRSA Assay (FDA approved for NASAL specimens only), is one component of a comprehensive MRSA  colonization surveillance program. It is not intended to diagnose MRSA infection nor to guide or monitor treatment for MRSA infections.    Studies/Results: No results found. Medications: I have reviewed the patient's current medications. Scheduled Meds: . insulin aspart  0-9 Units Subcutaneous Q4H  . pantoprazole (PROTONIX) IV  40 mg Intravenous Q12H   Followed by  . [START ON 12/08/2015] pantoprazole  40 mg Oral BID  . sodium chloride  1,000 mL Intravenous Once  . sodium chloride flush  10-40 mL Intracatheter Q12H   Continuous Infusions: . dextrose 5 % and 0.45 % NaCl with KCl 40 mEq/L 100 mL/hr at 12/07/15 0131   PRN Meds:.sodium chloride, acetaminophen, albuterol, bisacodyl, hydrALAZINE, labetalol, metoprolol, morphine injection, ondansetron (ZOFRAN) IV, sodium chloride flush   Assessment: Active Problems:   Food impaction of esophagus   Dysphagia   GI bleed   Acute GI bleeding   He was admitted with a possible food impaction ,h/o recurrent dysp hagia and food impaction  PLAN: 1. Advance diet  2.Continue PPI  3. When tolerating PO advance diet and discharge.  Avoid meat and large pieces of food till next EGD 4. Follow up with Dr Gustavo Lah as an outpatient to repeat EGD, rule out eosinophilic esophagitis, healing of esophagitis , reexamine GE junction .dilation of schatzkis ring.     LOS: 4 days   Jonathon Bellows 12/07/2015, 10:31 AM

## 2015-12-07 NOTE — Progress Notes (Signed)
[place] [facility]                                                                                                                                                                                  Patient Demographics   Shane Mcknight, is a 65 y.o. male, DOB - June 20, 1950, [id number]  Admit date - 12/03/2015   Admitting Physician No admitting provider for patient encounter.  Outpatient Primary MD for the patient is Einar Pheasant, MD   LOS - 4  Subjective: Patient complains of cough. Able to tolerate water his diet is being advanced to clear liquids    Review of Systems:   CONSTITUTIONAL: No documented fever. No fatigue, weakness. No weight gain, no weight loss.  EYES: No blurry or double vision.  ENT: No tinnitus. No postnasal drip. No redness of the oropharynx.  RESPIRATORY: No cough, no wheeze, no hemoptysis. No dyspnea.  CARDIOVASCULAR: No chest pain. No orthopnea. No palpitations. No syncope.  GASTROINTESTINAL: No nausea, no vomiting or diarrhea. No abdominal pain. No melena or hematochezia.  GENITOURINARY: No dysuria or hematuria.  ENDOCRINE: No polyuria or nocturia. No heat or cold intolerance.  HEMATOLOGY: No anemia. No bruising. No bleeding.  INTEGUMENTARY: No rashes. No lesions.  MUSCULOSKELETAL: No arthritis. No swelling. No gout.  NEUROLOGIC: No numbness, tingling, or ataxia. No seizure-type activity.  PSYCHIATRIC: No anxiety. No insomnia. No ADD.    Vitals:   Vitals:   12/06/15 0439 12/06/15 1554 12/06/15 2033 12/07/15 0428  BP: (!) 161/72 (!) 143/91 (!) 165/98 (!) 168/89  Pulse: 85 83 86 79  Resp: 18 20 20  (!) 23  Temp: 98.2 F (36.8 C) 98.8 F (37.1 C) 98.1 F (36.7 C) 98.2 F (36.8 C)  TempSrc: Oral Oral Oral Oral  SpO2: 98% 96% 98% 100%  Weight:      Height:        Wt Readings from Last 3 Encounters:  12/05/15 192 lb 8 oz (87.3 kg)  11/21/15 199 lb 9.6 oz (90.5 kg)  08/11/15 196 lb (88.9 kg)     Intake/Output  Summary (Last 24 hours) at 12/07/15 1156 Last data filed at 12/07/15 0305  Gross per 24 hour  Intake           2752.5 ml  Output                0 ml  Net           2752.5 ml    Physical Exam:   GENERAL: Pleasant-appearing in no apparent distress.  HEAD, EYES, EARS, NOSE AND THROAT: Atraumatic, normocephalic. Extraocular muscles are intact. Pupils equal and reactive to light. Sclerae anicteric.  No conjunctival injection. No oro-pharyngeal erythema.  NECK: Supple. There is no jugular venous distention. No bruits, no lymphadenopathy, no thyromegaly.  HEART: Regular rate and rhythm,. No murmurs, no rubs, no clicks.  LUNGS: Clear to auscultation bilaterally. No rales or rhonchi. No wheezes.  ABDOMEN: Soft, flat, nontender, nondistended. Has good bowel sounds. No hepatosplenomegaly appreciated.  EXTREMITIES: No evidence of any cyanosis, clubbing, or peripheral edema.  +2 pedal and radial pulses bilaterally.  NEUROLOGIC: The patient is alert, awake, and oriented x3 with no focal motor or sensory deficits appreciated bilaterally.  SKIN: Moist and warm with no rashes appreciated.  Psych: Not anxious, depressed LN: No inguinal LN enlargement    Antibiotics   Anti-infectives    Start     Dose/Rate Route Frequency Ordered Stop   12/07/15 1200  azithromycin (ZITHROMAX) tablet 500 mg     500 mg Oral Daily 12/07/15 1147     12/05/15 1400  meropenem (MERREM) IVPB SOLR 1 g  Status:  Discontinued     1 g 100 mL/hr over 30 Minutes Intravenous Every 8 hours 12/05/15 0905 12/05/15 1202   12/03/15 2200  meropenem (MERREM) 1 g in sodium chloride 0.9 % 100 mL IVPB  Status:  Discontinued     1 g 200 mL/hr over 30 Minutes Intravenous Every 8 hours 12/03/15 2029 12/05/15 0905      Medications   Scheduled Meds: . azithromycin  500 mg Oral Daily  . guaiFENesin  600 mg Oral BID  . insulin aspart  0-9 Units Subcutaneous Q4H  . pantoprazole (PROTONIX) IV  40 mg Intravenous Q12H   Followed by  . [START  ON 12/08/2015] pantoprazole  40 mg Oral BID  . sodium chloride  1,000 mL Intravenous Once  . sodium chloride flush  10-40 mL Intracatheter Q12H   Continuous Infusions:  PRN Meds:.sodium chloride, acetaminophen, albuterol, bisacodyl, guaiFENesin-dextromethorphan, hydrALAZINE, labetalol, metoprolol, morphine injection, ondansetron (ZOFRAN) IV, sodium chloride flush   Data Review:   Micro Results Recent Results (from the past 240 hour(s))  MRSA PCR Screening     Status: None   Collection Time: 12/03/15  8:35 PM  Result Value Ref Range Status   MRSA by PCR NEGATIVE NEGATIVE Final    Comment:        The GeneXpert MRSA Assay (FDA approved for NASAL specimens only), is one component of a comprehensive MRSA colonization surveillance program. It is not intended to diagnose MRSA infection nor to guide or monitor treatment for MRSA infections.     Radiology Reports Dg Chest 1 View  Result Date: 12/05/2015 CLINICAL DATA:  Dyspnea. EXAM: CHEST 1 VIEW COMPARISON:  12/04/2015 FINDINGS: Endotracheal tube has been removed. Left jugular catheter remains in place and terminates over the lower SVC. In the cardiac silhouette is upper limits of normal to mildly enlarged, unchanged. Mild left greater than right basilar opacities are unchanged and compatible with atelectasis. There is no evidence of new airspace consolidation, overt pulmonary edema, sizable pleural effusion, or pneumothorax. IMPRESSION: Interval extubation.  Unchanged bibasilar atelectasis. Electronically Signed   By: Logan Bores M.D.   On: 12/05/2015 07:51   Dg Chest 1 View  Result Date: 12/03/2015 CLINICAL DATA:  Hypoxia EXAM: CHEST 1 VIEW COMPARISON:  Study obtained earlier in the day FINDINGS: Endotracheal tube tip is 1.1 cm above the carina. No pneumothorax. There is left base atelectasis. Lungs elsewhere are clear. Heart size and pulmonary vascularity are normal. No adenopathy. There is a small hiatal hernia. Stomach is  distended with  air. IMPRESSION: Endotracheal tube as described without pneumothorax. Left base atelectasis. Lungs elsewhere clear. Small hiatal hernia. Stomach distended with air. Electronically Signed   By: Lowella Grip III M.D.   On: 12/03/2015 20:27   Dg Chest 2 View  Result Date: 12/03/2015 CLINICAL DATA:  Chest tightness. EXAM: CHEST  2 VIEW COMPARISON:  11/23/2015 FINDINGS: Heart and mediastinal contours are within normal limits. No focal opacities or effusions. No acute bony abnormality. Small hiatal hernia. IMPRESSION: No active cardiopulmonary disease. Electronically Signed   By: Rolm Baptise M.D.   On: 12/03/2015 15:32   Dg Chest 2 View  Result Date: 11/23/2015 CLINICAL DATA:  Cough EXAM: CHEST  2 VIEW COMPARISON:  March 21, 2006 FINDINGS: There is no edema or consolidation. The heart size and pulmonary vascularity are normal. No adenopathy. There is arthropathy in each shoulder region. There is atherosclerotic calcification in the aorta. IMPRESSION: Aortic atherosclerosis.  No edema or consolidation. Electronically Signed   By: Lowella Grip III M.D.   On: 11/23/2015 16:12   Ct Chest W Contrast  Result Date: 12/04/2015 CLINICAL DATA:  Acute onset of dysphagia. Largest adherent clot noted at the gastroesophageal junction, concerning for a large Mallory-Weiss tear or Boerhaave's. Evaluate for perforation. Initial encounter. EXAM: CT CHEST AND ABDOMEN WITH CONTRAST TECHNIQUE: Multidetector CT imaging of the chest and abdomen was performed following the standard protocol during bolus administration of intravenous contrast. CONTRAST:  178mL ISOVUE-300 IOPAMIDOL (ISOVUE-300) INJECTION 61% COMPARISON:  MRI of the lumbar spine performed 04/29/2014 FINDINGS: CT CHEST FINDINGS Cardiovascular: Diffuse coronary artery calcifications are seen. The heart remains borderline normal in size. Mild calcification is noted at the aortic arch. The great vessels are grossly unremarkable in appearance.  Mediastinum/Nodes: There is no evidence of esophageal perforation. Diffuse Khurana thickening is noted along the mid to distal esophagus and about the gastroesophageal junction, concerning for esophagitis. Mild surrounding soft tissue inflammation is noted. An underlying mass cannot be excluded. No mediastinal lymphadenopathy is seen. No pericardial effusion is identified. The thyroid gland is unremarkable. No axillary lymphadenopathy is appreciated. The endotracheal tube is seen ending 2 cm above the carina. The endotracheal tube balloon appears mildly overinflated. Lungs/Pleura: Mild patchy bibasilar airspace opacification may reflect atelectasis or possibly mild infection. No pleural effusion or pneumothorax is seen. No dominant mass is identified. Musculoskeletal: No acute osseous abnormalities are identified. The visualized musculature is unremarkable in appearance. CT ABDOMEN PELVIS FINDINGS Hepatobiliary: The liver is unremarkable in appearance. The gallbladder is unremarkable in appearance. The common bile duct remains normal in caliber. Pancreas: The pancreas is within normal limits. Spleen: The spleen is unremarkable in appearance. Adrenals/Urinary Tract: The adrenal glands are unremarkable in appearance. The kidneys are within normal limits. There is no evidence of hydronephrosis. No renal or ureteral stones are identified. Nonspecific perinephric stranding is noted bilaterally. Stomach/Bowel: The stomach is unremarkable in appearance. The visualized portions of the small bowel are within normal limits. The appendix is normal in caliber, without evidence of appendicitis. The visualized portions of the colon are unremarkable in appearance. Vascular/Lymphatic: Mild calcification is noted along the abdominal aorta. No retroperitoneal lymphadenopathy is seen. Other: No additional soft tissue abnormalities are seen. Musculoskeletal: No acute osseous abnormalities are identified. Multilevel vacuum phenomenon is  noted along the lower thoracic and lumbar spine. The visualized musculature is unremarkable in appearance. IMPRESSION: 1. No definite evidence of esophageal perforation. Diffuse Wittner thickening along the mid to distal esophagus and about the gastroesophageal junction, concerning for esophagitis. Mild surrounding soft  tissue inflammation noted. The suspected Mallory-Weiss tear is not well assessed on CT. Underlying mass cannot be excluded. Would correlate with the patient's symptoms. 2. Mild patchy bibasilar airspace opacification may reflect atelectasis or possibly mild infection. 3. Endotracheal tube seen ending 2 cm above the carina. The endotracheal tube balloon appears mildly overinflated. Would deflate slightly as deemed clinically appropriate. 4. Diffuse coronary artery calcifications seen. 5. Mild aortic atherosclerosis. 6. Mild degenerative change along the lower thoracic and lumbar spine. Electronically Signed   By: Garald Balding M.D.   On: 12/04/2015 00:16   Ct Abdomen W Contrast  Result Date: 12/04/2015 CLINICAL DATA:  Acute onset of dysphagia. Largest adherent clot noted at the gastroesophageal junction, concerning for a large Mallory-Weiss tear or Boerhaave's. Evaluate for perforation. Initial encounter. EXAM: CT CHEST AND ABDOMEN WITH CONTRAST TECHNIQUE: Multidetector CT imaging of the chest and abdomen was performed following the standard protocol during bolus administration of intravenous contrast. CONTRAST:  160mL ISOVUE-300 IOPAMIDOL (ISOVUE-300) INJECTION 61% COMPARISON:  MRI of the lumbar spine performed 04/29/2014 FINDINGS: CT CHEST FINDINGS Cardiovascular: Diffuse coronary artery calcifications are seen. The heart remains borderline normal in size. Mild calcification is noted at the aortic arch. The great vessels are grossly unremarkable in appearance. Mediastinum/Nodes: There is no evidence of esophageal perforation. Diffuse Rotan thickening is noted along the mid to distal esophagus and  about the gastroesophageal junction, concerning for esophagitis. Mild surrounding soft tissue inflammation is noted. An underlying mass cannot be excluded. No mediastinal lymphadenopathy is seen. No pericardial effusion is identified. The thyroid gland is unremarkable. No axillary lymphadenopathy is appreciated. The endotracheal tube is seen ending 2 cm above the carina. The endotracheal tube balloon appears mildly overinflated. Lungs/Pleura: Mild patchy bibasilar airspace opacification may reflect atelectasis or possibly mild infection. No pleural effusion or pneumothorax is seen. No dominant mass is identified. Musculoskeletal: No acute osseous abnormalities are identified. The visualized musculature is unremarkable in appearance. CT ABDOMEN PELVIS FINDINGS Hepatobiliary: The liver is unremarkable in appearance. The gallbladder is unremarkable in appearance. The common bile duct remains normal in caliber. Pancreas: The pancreas is within normal limits. Spleen: The spleen is unremarkable in appearance. Adrenals/Urinary Tract: The adrenal glands are unremarkable in appearance. The kidneys are within normal limits. There is no evidence of hydronephrosis. No renal or ureteral stones are identified. Nonspecific perinephric stranding is noted bilaterally. Stomach/Bowel: The stomach is unremarkable in appearance. The visualized portions of the small bowel are within normal limits. The appendix is normal in caliber, without evidence of appendicitis. The visualized portions of the colon are unremarkable in appearance. Vascular/Lymphatic: Mild calcification is noted along the abdominal aorta. No retroperitoneal lymphadenopathy is seen. Other: No additional soft tissue abnormalities are seen. Musculoskeletal: No acute osseous abnormalities are identified. Multilevel vacuum phenomenon is noted along the lower thoracic and lumbar spine. The visualized musculature is unremarkable in appearance. IMPRESSION: 1. No definite  evidence of esophageal perforation. Diffuse Para thickening along the mid to distal esophagus and about the gastroesophageal junction, concerning for esophagitis. Mild surrounding soft tissue inflammation noted. The suspected Mallory-Weiss tear is not well assessed on CT. Underlying mass cannot be excluded. Would correlate with the patient's symptoms. 2. Mild patchy bibasilar airspace opacification may reflect atelectasis or possibly mild infection. 3. Endotracheal tube seen ending 2 cm above the carina. The endotracheal tube balloon appears mildly overinflated. Would deflate slightly as deemed clinically appropriate. 4. Diffuse coronary artery calcifications seen. 5. Mild aortic atherosclerosis. 6. Mild degenerative change along the lower thoracic and lumbar  spine. Electronically Signed   By: Garald Balding M.D.   On: 12/04/2015 00:16   Dg Chest Port 1 View  Result Date: 12/04/2015 CLINICAL DATA:  Central line placement. EXAM: PORTABLE CHEST 1 VIEW COMPARISON:  12/04/2015 . FINDINGS: Endotracheal tube, left IJ line in stable position. Cardiomegaly with normal pulmonary vascularity. Low lung volumes with mild bibasilar atelectasis. No pleural effusion or pneumothorax. IMPRESSION: 1. Lines and tubes in stable position. 2. Low lung volumes with mild bibasilar atelectasis. Electronically Signed   By: Marcello Moores  Register   On: 12/04/2015 09:18   Portable Chest Xray  Result Date: 12/04/2015 CLINICAL DATA:  Respiratory failure, history of diabetes, gastroesophageal reflux, remote history of smoking. EXAM: PORTABLE CHEST 1 VIEW COMPARISON:  CT scan of the chest of December 03, 2015 and chest x-ray of the same date FINDINGS: The right hemidiaphragm is higher than the left. There remains gaseous distention of the stomach. There is patchy density at the left lung base consistent with atelectasis. There is no pleural effusion. The cardiac silhouette is enlarged. The pulmonary vascularity is not engorged. The  endotracheal tube tip lies 3.7 cm above the carina. There is mild tortuosity of the descending thoracic aorta. IMPRESSION: Bilateral hypo inflation. Left lower lobe atelectasis or early pneumonia. Cardiomegaly without pulmonary edema. The support tubes are in reasonable position. No significant mediastinal widening allowing for the hypo inflation. Persistent gaseous distention of the stomach. Electronically Signed   By: David  Martinique M.D.   On: 12/04/2015 07:19     CBC  Recent Labs Lab 12/03/15 1510  12/04/15 1300 12/04/15 1630 12/05/15 0447 12/06/15 0537 12/07/15 0533  WBC 9.2  < > 7.6 6.3 6.6 5.4 5.7  HGB 14.3  < > 11.8* 11.4* 11.7* 11.6* 12.3*  HCT 41.2  < > 33.5* 32.9* 32.8* 32.7* 34.8*  PLT 206  < > 137* 140* 142* 140* 167  MCV 84.9  < > 85.5 86.5 86.5 86.6 85.1  MCH 29.4  < > 30.2 30.1 30.8 30.8 30.0  MCHC 34.6  < > 35.3 34.8 35.6 35.6 35.2  RDW 15.0*  < > 15.3* 14.9* 14.9* 14.9* 14.6*  LYMPHSABS 1.5  --   --  1.1  --   --   --   MONOABS 0.6  --   --  0.7  --   --   --   EOSABS 0.5  --   --  0.3  --   --   --   BASOSABS 0.0  --   --  0.0  --   --   --   < > = values in this interval not displayed.  Chemistries   Recent Labs Lab 12/03/15 1510 12/03/15 2044 12/04/15 0538 12/05/15 0447 12/06/15 0537  NA 140 140 137 138 138  K 3.4* 3.2* 3.2* 4.0 4.0  CL 104 104 107 109 109  CO2 24 25 24 25 25   GLUCOSE 127* 132* 162* 143* 132*  BUN 11 11 10 6  <5*  CREATININE 0.89 0.95 0.94 0.87 0.82  CALCIUM 9.8 9.1 7.9* 8.1* 8.2*  MG  --  1.4* 2.0 1.6*  --   AST 33  --   --   --   --   ALT 34  --   --   --   --   ALKPHOS 44  --   --   --   --   BILITOT 0.8  --   --   --   --    ------------------------------------------------------------------------------------------------------------------  estimated creatinine clearance is 94 mL/min (by C-G formula based on SCr of 0.82  mg/dL). ------------------------------------------------------------------------------------------------------------------ No results for input(s): HGBA1C in the last 72 hours. ------------------------------------------------------------------------------------------------------------------ No results for input(s): CHOL, HDL, LDLCALC, TRIG, CHOLHDL, LDLDIRECT in the last 72 hours. ------------------------------------------------------------------------------------------------------------------ No results for input(s): TSH, T4TOTAL, T3FREE, THYROIDAB in the last 72 hours.  Invalid input(s): FREET3 ------------------------------------------------------------------------------------------------------------------ No results for input(s): VITAMINB12, FOLATE, FERRITIN, TIBC, IRON, RETICCTPCT in the last 72 hours.  Coagulation profile  Recent Labs Lab 12/03/15 2044  INR 1.15    No results for input(s): DDIMER in the last 72 hours.  Cardiac Enzymes  Recent Labs Lab 12/03/15 1510  TROPONINI <0.03   ------------------------------------------------------------------------------------------------------------------ Invalid input(s): POCBNP    Assessment & Plan  Patient is a 65 year old admitted with food impact esopahgus  1. Mallory-Weiss tear Supportive care Continue PPIs GI is following the patient Diet being advanced per GI  2. Essential hypertention Continue there medications'll restart his home medications   3.Hyperlipidemia Restart oral medications  4. DMII  We'll place on sliding scale   5. Acute bronchitis were treated with oral azithromycin and antitussives  6. Miscellaneous  SCDs for DVT prophylaxis        Code Status Orders        Start     Ordered   12/03/15 2009  Full code  Continuous     12/03/15 2013    Code Status History    Date Active Date Inactive Code Status Order ID Comments User Context   This patient has a current code status but no  historical code status.    Advance Directive Documentation   Flowsheet Row Most Recent Value  Type of Advance Directive  -- [wife believes we have a copy at [facility], will pass along to day]  Pre-existing out of facility DNR order (yellow form or pink MOST form)  No data  "MOST" Form in Place?  No data           Consults GI   DVT Prophylaxis  SCDs  Lab Results  Component Value Date   PLT 167 12/07/2015     Time Spent in minutes   32 minutes  Greater than 50% of time spent in care coordination and counseling patient regarding the condition and plan of care.   Dustin Flock M.D on 12/07/2015 at 11:56 AM  Between 7am to 6pm - Pager - [phone]  After 6pm go to [email or web address] - password [email or web address] [facility]   Office  [phone]

## 2015-12-08 LAB — GLUCOSE, CAPILLARY
GLUCOSE-CAPILLARY: 111 mg/dL — AB (ref 65–99)
Glucose-Capillary: 94 mg/dL (ref 65–99)
Glucose-Capillary: 109 mg/dL — ABNORMAL HIGH (ref 65–99)

## 2015-12-08 MED ORDER — PANTOPRAZOLE SODIUM 40 MG PO TBEC: 40.0000 mg | DELAYED_RELEASE_TABLET | Freq: Two times a day (BID) | ORAL | 0 refills | Status: DC

## 2015-12-08 MED ORDER — AZITHROMYCIN 500 MG PO TABS: 500.0000 mg | ORAL_TABLET | Freq: Every day | ORAL | 0 refills | Status: DC

## 2015-12-08 NOTE — Discharge Summary (Signed)
[facility] at [facility], 65 y.o., DOB [birth year], MRN [id number]. Admission date: 12/03/2015 Discharge Date 12/08/2015 Primary MD Einar Pheasant, MD Admitting Physician No admitting provider for patient encounter.  Admission Diagnosis  Esophageal obstruction due to food impaction [K22.2, T18.128A]  Discharge Diagnosis   Active Problems:   Food impaction of esophagus   Dysphagia  Acute bronchitis    Mallory-Weiss tear  Sleep apnea Cirrhotic arthritis Essential hypertension Hyperlipidemia GERD Diabetes type 2       Hospital Course This is a 65 year old Caucasian male with a past medical history of grade C erosive esophagitis, esophageal ring and dilated dictation, type 2 diabetes, acid reflux, hiatal hernia, obstructive sleep apnea, and hypertension who presented with difficulty swallowing. , patient's symptoms started over the past several weeks and gradually got worse to the point where he was unable to swallow effectively. He describes his symptoms as "food getting hung up in his chest". Prior to ED arrival, patient was eating a bite of potato and mid but felt like it got stuck in his chest. He tried dislodging it with some water but started vomiting. He reported vomiting multiple times prior to coming to the ED and emesis was mostly undigested but subsequently became bloody hence, he decided to come to the ED. His last EGD was in May 2017 and showed esophagitis and Candida esophagitis. He takes an aspirin a day but does not take any other NSAIDs or anticoagulants. He had an EGD on the day of admission that a large adherent clot at the GEJ suggestive of a large Mallory Weiss tear. He was maintained on full vent support for airway protection . Patient subsequently was extubated. He was kept on the Protonix drip. His diet was slowly advanced. He has no evidence of bleeding. He is hemodynamically stable and has been cleared by GI to be discharged  home. Patient did complain of cough and congestion so he is being treated for acute bronchitis.        Consults  GI  Significant Tests:  See full reports for all details     Dg Chest 1 View  Result Date: 12/05/2015 CLINICAL DATA:  Dyspnea. EXAM: CHEST 1 VIEW COMPARISON:  12/04/2015 FINDINGS: Endotracheal tube has been removed. Left jugular catheter remains in place and terminates over the lower SVC. In the cardiac silhouette is upper limits of normal to mildly enlarged, unchanged. Mild left greater than right basilar opacities are unchanged and compatible with atelectasis. There is no evidence of new airspace consolidation, overt pulmonary edema, sizable pleural effusion, or pneumothorax. IMPRESSION: Interval extubation.  Unchanged bibasilar atelectasis. Electronically Signed   By: Logan Bores M.D.   On: 12/05/2015 07:51   Dg Chest 1 View  Result Date: 12/03/2015 CLINICAL DATA:  Hypoxia EXAM: CHEST 1 VIEW COMPARISON:  Study obtained earlier in the day FINDINGS: Endotracheal tube tip is 1.1 cm above the carina. No pneumothorax. There is left base atelectasis. Lungs elsewhere are clear. Heart size and pulmonary vascularity are normal. No adenopathy. There is a small hiatal hernia. Stomach is distended with air. IMPRESSION: Endotracheal tube as described without pneumothorax. Left base atelectasis. Lungs elsewhere clear. Small hiatal hernia. Stomach distended with air. Electronically Signed   By: Lowella Grip III M.D.   On: 12/03/2015 20:27   Dg Chest 2 View  Result Date: 12/03/2015 CLINICAL DATA:  Chest tightness. EXAM: CHEST  2 VIEW COMPARISON:  11/23/2015 FINDINGS: Heart and mediastinal contours are within normal limits. No  focal opacities or effusions. No acute bony abnormality. Small hiatal hernia. IMPRESSION: No active cardiopulmonary disease. Electronically Signed   By: Rolm Baptise M.D.   On: 12/03/2015 15:32   Dg Chest 2 View  Result Date: 11/23/2015 CLINICAL DATA:  Cough  EXAM: CHEST  2 VIEW COMPARISON:  March 21, 2006 FINDINGS: There is no edema or consolidation. The heart size and pulmonary vascularity are normal. No adenopathy. There is arthropathy in each shoulder region. There is atherosclerotic calcification in the aorta. IMPRESSION: Aortic atherosclerosis.  No edema or consolidation. Electronically Signed   By: Lowella Grip III M.D.   On: 11/23/2015 16:12   Ct Chest W Contrast  Result Date: 12/04/2015 CLINICAL DATA:  Acute onset of dysphagia. Largest adherent clot noted at the gastroesophageal junction, concerning for a large Mallory-Weiss tear or Boerhaave's. Evaluate for perforation. Initial encounter. EXAM: CT CHEST AND ABDOMEN WITH CONTRAST TECHNIQUE: Multidetector CT imaging of the chest and abdomen was performed following the standard protocol during bolus administration of intravenous contrast. CONTRAST:  126mL ISOVUE-300 IOPAMIDOL (ISOVUE-300) INJECTION 61% COMPARISON:  MRI of the lumbar spine performed 04/29/2014 FINDINGS: CT CHEST FINDINGS Cardiovascular: Diffuse coronary artery calcifications are seen. The heart remains borderline normal in size. Mild calcification is noted at the aortic arch. The great vessels are grossly unremarkable in appearance. Mediastinum/Nodes: There is no evidence of esophageal perforation. Diffuse Wuellner thickening is noted along the mid to distal esophagus and about the gastroesophageal junction, concerning for esophagitis. Mild surrounding soft tissue inflammation is noted. An underlying mass cannot be excluded. No mediastinal lymphadenopathy is seen. No pericardial effusion is identified. The thyroid gland is unremarkable. No axillary lymphadenopathy is appreciated. The endotracheal tube is seen ending 2 cm above the carina. The endotracheal tube balloon appears mildly overinflated. Lungs/Pleura: Mild patchy bibasilar airspace opacification may reflect atelectasis or possibly mild infection. No pleural effusion or pneumothorax is  seen. No dominant mass is identified. Musculoskeletal: No acute osseous abnormalities are identified. The visualized musculature is unremarkable in appearance. CT ABDOMEN PELVIS FINDINGS Hepatobiliary: The liver is unremarkable in appearance. The gallbladder is unremarkable in appearance. The common bile duct remains normal in caliber. Pancreas: The pancreas is within normal limits. Spleen: The spleen is unremarkable in appearance. Adrenals/Urinary Tract: The adrenal glands are unremarkable in appearance. The kidneys are within normal limits. There is no evidence of hydronephrosis. No renal or ureteral stones are identified. Nonspecific perinephric stranding is noted bilaterally. Stomach/Bowel: The stomach is unremarkable in appearance. The visualized portions of the small bowel are within normal limits. The appendix is normal in caliber, without evidence of appendicitis. The visualized portions of the colon are unremarkable in appearance. Vascular/Lymphatic: Mild calcification is noted along the abdominal aorta. No retroperitoneal lymphadenopathy is seen. Other: No additional soft tissue abnormalities are seen. Musculoskeletal: No acute osseous abnormalities are identified. Multilevel vacuum phenomenon is noted along the lower thoracic and lumbar spine. The visualized musculature is unremarkable in appearance. IMPRESSION: 1. No definite evidence of esophageal perforation. Diffuse Feng thickening along the mid to distal esophagus and about the gastroesophageal junction, concerning for esophagitis. Mild surrounding soft tissue inflammation noted. The suspected Mallory-Weiss tear is not well assessed on CT. Underlying mass cannot be excluded. Would correlate with the patient's symptoms. 2. Mild patchy bibasilar airspace opacification may reflect atelectasis or possibly mild infection. 3. Endotracheal tube seen ending 2 cm above the carina. The endotracheal tube balloon appears mildly overinflated. Would deflate  slightly as deemed clinically appropriate. 4. Diffuse coronary artery calcifications seen. 5.  Mild aortic atherosclerosis. 6. Mild degenerative change along the lower thoracic and lumbar spine. Electronically Signed   By: Garald Balding M.D.   On: 12/04/2015 00:16   Ct Abdomen W Contrast  Result Date: 12/04/2015 CLINICAL DATA:  Acute onset of dysphagia. Largest adherent clot noted at the gastroesophageal junction, concerning for a large Mallory-Weiss tear or Boerhaave's. Evaluate for perforation. Initial encounter. EXAM: CT CHEST AND ABDOMEN WITH CONTRAST TECHNIQUE: Multidetector CT imaging of the chest and abdomen was performed following the standard protocol during bolus administration of intravenous contrast. CONTRAST:  143mL ISOVUE-300 IOPAMIDOL (ISOVUE-300) INJECTION 61% COMPARISON:  MRI of the lumbar spine performed 04/29/2014 FINDINGS: CT CHEST FINDINGS Cardiovascular: Diffuse coronary artery calcifications are seen. The heart remains borderline normal in size. Mild calcification is noted at the aortic arch. The great vessels are grossly unremarkable in appearance. Mediastinum/Nodes: There is no evidence of esophageal perforation. Diffuse Fuerstenberg thickening is noted along the mid to distal esophagus and about the gastroesophageal junction, concerning for esophagitis. Mild surrounding soft tissue inflammation is noted. An underlying mass cannot be excluded. No mediastinal lymphadenopathy is seen. No pericardial effusion is identified. The thyroid gland is unremarkable. No axillary lymphadenopathy is appreciated. The endotracheal tube is seen ending 2 cm above the carina. The endotracheal tube balloon appears mildly overinflated. Lungs/Pleura: Mild patchy bibasilar airspace opacification may reflect atelectasis or possibly mild infection. No pleural effusion or pneumothorax is seen. No dominant mass is identified. Musculoskeletal: No acute osseous abnormalities are identified. The visualized musculature is  unremarkable in appearance. CT ABDOMEN PELVIS FINDINGS Hepatobiliary: The liver is unremarkable in appearance. The gallbladder is unremarkable in appearance. The common bile duct remains normal in caliber. Pancreas: The pancreas is within normal limits. Spleen: The spleen is unremarkable in appearance. Adrenals/Urinary Tract: The adrenal glands are unremarkable in appearance. The kidneys are within normal limits. There is no evidence of hydronephrosis. No renal or ureteral stones are identified. Nonspecific perinephric stranding is noted bilaterally. Stomach/Bowel: The stomach is unremarkable in appearance. The visualized portions of the small bowel are within normal limits. The appendix is normal in caliber, without evidence of appendicitis. The visualized portions of the colon are unremarkable in appearance. Vascular/Lymphatic: Mild calcification is noted along the abdominal aorta. No retroperitoneal lymphadenopathy is seen. Other: No additional soft tissue abnormalities are seen. Musculoskeletal: No acute osseous abnormalities are identified. Multilevel vacuum phenomenon is noted along the lower thoracic and lumbar spine. The visualized musculature is unremarkable in appearance. IMPRESSION: 1. No definite evidence of esophageal perforation. Diffuse Cardy thickening along the mid to distal esophagus and about the gastroesophageal junction, concerning for esophagitis. Mild surrounding soft tissue inflammation noted. The suspected Mallory-Weiss tear is not well assessed on CT. Underlying mass cannot be excluded. Would correlate with the patient's symptoms. 2. Mild patchy bibasilar airspace opacification may reflect atelectasis or possibly mild infection. 3. Endotracheal tube seen ending 2 cm above the carina. The endotracheal tube balloon appears mildly overinflated. Would deflate slightly as deemed clinically appropriate. 4. Diffuse coronary artery calcifications seen. 5. Mild aortic atherosclerosis. 6. Mild  degenerative change along the lower thoracic and lumbar spine. Electronically Signed   By: Garald Balding M.D.   On: 12/04/2015 00:16   Dg Chest Port 1 View  Result Date: 12/04/2015 CLINICAL DATA:  Central line placement. EXAM: PORTABLE CHEST 1 VIEW COMPARISON:  12/04/2015 . FINDINGS: Endotracheal tube, left IJ line in stable position. Cardiomegaly with normal pulmonary vascularity. Low lung volumes with mild bibasilar atelectasis. No pleural effusion or pneumothorax. IMPRESSION:  1. Lines and tubes in stable position. 2. Low lung volumes with mild bibasilar atelectasis. Electronically Signed   By: Marcello Moores  Register   On: 12/04/2015 09:18   Portable Chest Xray  Result Date: 12/04/2015 CLINICAL DATA:  Respiratory failure, history of diabetes, gastroesophageal reflux, remote history of smoking. EXAM: PORTABLE CHEST 1 VIEW COMPARISON:  CT scan of the chest of December 03, 2015 and chest x-ray of the same date FINDINGS: The right hemidiaphragm is higher than the left. There remains gaseous distention of the stomach. There is patchy density at the left lung base consistent with atelectasis. There is no pleural effusion. The cardiac silhouette is enlarged. The pulmonary vascularity is not engorged. The endotracheal tube tip lies 3.7 cm above the carina. There is mild tortuosity of the descending thoracic aorta. IMPRESSION: Bilateral hypo inflation. Left lower lobe atelectasis or early pneumonia. Cardiomegaly without pulmonary edema. The support tubes are in reasonable position. No significant mediastinal widening allowing for the hypo inflation. Persistent gaseous distention of the stomach. Electronically Signed   By: David  Martinique M.D.   On: 12/04/2015 07:19       Today   Subjective:   Shane Mcknight  Feels well denies any chest pain or shortness of breath tolerating diet  Objective:   Blood pressure 104/65, pulse 70, temperature 98.2 F (36.8 C), temperature source Oral, resp. rate (!) 21, height 5\' 8"   (1.727 m), weight 192 lb 8 oz (87.3 kg), SpO2 99 %.  .  Intake/Output Summary (Last 24 hours) at 12/08/15 1445 Last data filed at 12/08/15 0900  Gross per 24 hour  Intake              480 ml  Output                0 ml  Net              480 ml    Exam VITAL SIGNS: Blood pressure 104/65, pulse 70, temperature 98.2 F (36.8 C), temperature source Oral, resp. rate (!) 21, height 5\' 8"  (1.727 m), weight 192 lb 8 oz (87.3 kg), SpO2 99 %.  GENERAL:  65 y.o.-year-old patient lying in the bed with no acute distress.  EYES: Pupils equal, round, reactive to light and accommodation. No scleral icterus. Extraocular muscles intact.  HEENT: Head atraumatic, normocephalic. Oropharynx and nasopharynx clear.  NECK:  Supple, no jugular venous distention. No thyroid enlargement, no tenderness.  LUNGS: Normal breath sounds bilaterally, no wheezing, rales,rhonchi or crepitation. No use of accessory muscles of respiration.  CARDIOVASCULAR: S1, S2 normal. No murmurs, rubs, or gallops.  ABDOMEN: Soft, nontender, nondistended. Bowel sounds present. No organomegaly or mass.  EXTREMITIES: No pedal edema, cyanosis, or clubbing.  NEUROLOGIC: Cranial nerves II through XII are intact. Muscle strength 5/5 in all extremities. Sensation intact. Gait not checked.  PSYCHIATRIC: The patient is alert and oriented x 3.  SKIN: No obvious rash, lesion, or ulcer.   Data Review     CBC w Diff: Lab Results  Component Value Date   WBC 5.7 12/07/2015   HGB 12.3 (L) 12/07/2015   HCT 34.8 (L) 12/07/2015   PLT 167 12/07/2015   LYMPHOPCT 18 12/04/2015   MONOPCT 11 12/04/2015   EOSPCT 4 12/04/2015   BASOPCT 0 12/04/2015   CMP: Lab Results  Component Value Date   NA 138 12/06/2015   K 4.0 12/06/2015   CL 109 12/06/2015   CO2 25 12/06/2015   BUN <5 (L) 12/06/2015   CREATININE  0.82 12/06/2015   GLU 117 08/12/2013   PROT 7.9 12/03/2015   ALBUMIN 4.5 12/03/2015   BILITOT 0.8 12/03/2015   ALKPHOS 44 12/03/2015   AST 33  12/03/2015   ALT 34 12/03/2015  .  Micro Results Recent Results (from the past 240 hour(s))  MRSA PCR Screening     Status: None   Collection Time: 12/03/15  8:35 PM  Result Value Ref Range Status   MRSA by PCR NEGATIVE NEGATIVE Final    Comment:        The GeneXpert MRSA Assay (FDA approved for NASAL specimens only), is one component of a comprehensive MRSA colonization surveillance program. It is not intended to diagnose MRSA infection nor to guide or monitor treatment for MRSA infections.      Code Status History    Date Active Date Inactive Code Status Order ID Comments User Context   12/03/2015  8:13 PM 12/08/2015  2:12 PM Full Code [id number]  Mikael Spray, NP Inpatient    Advance Directive Documentation   Flowsheet Row Most Recent Value  Type of Advance Directive  Healthcare Power of Attorney  Pre-existing out of facility DNR order (yellow form or pink MOST form)  No data  "MOST" Form in Place?  No data          Follow-up Information    SKULSKIE, Billie Ruddy, MD Follow up in 2 week(s).   Specialty:  Gastroenterology Contact information: [address] [place] [address] [phone]        Einar Pheasant, MD Follow up in 1 week(s).   Specialty:  Internal Medicine Contact information: [id number] [place]  [id number] [phone]           Discharge Medications     Medication List    STOP taking these medications   aspirin 81 MG tablet   fish oil-omega-3 fatty acids 1000 MG capsule     TAKE these medications   atenolol 50 MG tablet Commonly known as:  TENORMIN TAKE 1 TABLET BY MOUTH TWICE DAILY   azithromycin 500 MG tablet Commonly known as:  ZITHROMAX Take 1 tablet (500 mg total) by mouth daily. What changed:  medication strength  how much to take  how to take this  when to take this  additional instructions   COSENTYX SENSOREADY PEN 150 MG/ML  Soaj Generic drug:  Secukinumab Inject 1 Dose into the skin every 30 (thirty) days. Due 123456   folic acid 1 MG tablet Commonly known as:  FOLVITE Take 1 mg by mouth daily.   gabapentin 300 MG capsule Commonly known as:  NEURONTIN Take 300 mg by mouth 3 (three) times daily as needed.   hydrochlorothiazide 25 MG tablet Commonly known as:  HYDRODIURIL TAKE 1 TABLET BY MOUTH EVERY DAY   HYDROcodone-homatropine 5-1.5 MG/5ML syrup Commonly known as:  HYCODAN Take 5 mLs by mouth at bedtime as needed for cough.   losartan 50 MG tablet Commonly known as:  COZAAR TAKE 1 TABLET BY MOUTH EVERY DAY   metFORMIN 500 MG tablet Commonly known as:  GLUCOPHAGE TAKE 4 TABLETS BY MOUTH EVERY EVENING. What changed:  See the new instructions.   multivitamin tablet Take 1 tablet by mouth daily.   pantoprazole 40 MG tablet Commonly known as:  PROTONIX Take 1 tablet (40 mg total) by mouth 2 (two) times daily. What changed:  when to take this   traMADol 50 MG tablet Commonly known as:  ULTRAM Take 1  tablet (50 mg total) by mouth 2 (two) times daily as needed.   vitamin E 400 UNIT capsule Take 400 Units by mouth daily.          Total Time in preparing paper work, data evaluation and todays exam - 35 minutes  Dustin Flock M.D on 12/08/2015 at 2:45 PM  [facility]   Office  [phone]

## 2015-12-08 NOTE — Progress Notes (Signed)
Pt being discharged home, discharge instructions reviewed with pt, wife and son, states understanding, pt with no noted complaints, no distress or discomfort noted at discharge, tolerated medications and ordered diet without any difficulty today

## 2015-12-15 ENCOUNTER — Telehealth: Payer: Self-pay | Admitting: *Deleted

## 2015-12-15 NOTE — Telephone Encounter (Signed)
Pt called he stated that he was discharged from [facility] on 11/24 , he stated that he will need a HFU. Please give a time and date to place pt.  Pt contact [phone]

## 2015-12-15 NOTE — Telephone Encounter (Signed)
Please advise appt date and time. Thanks needs to be either after 3pm or after noon on a Friday see below, thanks    Transition Care Management Follow-up Telephone Call  How have you been since you were released from the hospital?doing good, getting strength back, soft diet, no meats   Do you understand why you were in the hospital?yes   Do you understand the discharge instrcutions?yes  Items Reviewed:  Medications reviewed: dropped the aspirin and fish oil, the rest stayed the same.  Was on a antibiotic in the hospital and then prescription, two pills and then 4 days of single pills  Allergies reviewed: yes, no changes  Dietary changes reviewed: soft diet, no meats  Referrals reviewed: upper scope with GI, has appt with GI on tuesday   Functional Questionnaire:   Activities of Daily Living (ADLs):   He states they are independent in the following: independent States they require assistance with the following: none   Any transportation issues/concerns?: no   Any patient concerns? No concerns   Confirmed importance and date/time of follow-up visits scheduled:  Late in the day after 3pm or on a Friday after lunch time, needs to be able to work as the hospital stay took most of his vacation/sick time.     Confirmed with patient if condition begins to worsen call PCP or go to the ER.  Patient was given the Call-a-Nurse line [phone]: yes and verbalized understanding

## 2015-12-15 NOTE — Telephone Encounter (Signed)
He is overdue a regular follow up with me.  Can schedule for a regular f/u.  Will see GI Tuesday for his hospital f/u.

## 2015-12-15 NOTE — Telephone Encounter (Signed)
Looking at her schedule I don't see anything for this month, do you know if there is something available, if not can he be scheduled at the beginning of January? Thoughts, thanks

## 2015-12-18 NOTE — Telephone Encounter (Signed)
Dr. Nicki Reaper has a couple openings this week that just came available

## 2015-12-18 NOTE — Telephone Encounter (Signed)
Spoke with patient and scheduled for follow up on Friday the 8th.

## 2015-12-22 ENCOUNTER — Encounter: Payer: Self-pay | Admitting: Internal Medicine

## 2015-12-22 ENCOUNTER — Ambulatory Visit (INDEPENDENT_AMBULATORY_CARE_PROVIDER_SITE_OTHER): Payer: BLUE CROSS/BLUE SHIELD | Admitting: Internal Medicine

## 2015-12-22 DIAGNOSIS — I10 Essential (primary) hypertension: Secondary | ICD-10-CM | POA: Diagnosis not present

## 2015-12-22 DIAGNOSIS — L4050 Arthropathic psoriasis, unspecified: Secondary | ICD-10-CM

## 2015-12-22 DIAGNOSIS — K219 Gastro-esophageal reflux disease without esophagitis: Secondary | ICD-10-CM

## 2015-12-22 DIAGNOSIS — T18128D Food in esophagus causing other injury, subsequent encounter: Secondary | ICD-10-CM

## 2015-12-22 DIAGNOSIS — R1319 Other dysphagia: Secondary | ICD-10-CM

## 2015-12-22 DIAGNOSIS — E119 Type 2 diabetes mellitus without complications: Secondary | ICD-10-CM

## 2015-12-22 DIAGNOSIS — R1310 Dysphagia, unspecified: Secondary | ICD-10-CM

## 2015-12-22 NOTE — Progress Notes (Signed)
Pre visit review using our clinic review tool, if applicable. No additional management support is needed unless otherwise documented below in the visit note. fol

## 2015-12-22 NOTE — Progress Notes (Signed)
Patient ID: Shane Mcknight, male   DOB: [birth year], 65 y.o.   MRN: [id number]   Subjective:    Patient ID: Shane Mcknight, male    DOB: [birth year], 65 y.o.   MRN: [id number]  HPI  Patient here for a scheduled follow up.  Was recently admitted to [facility] after food bolus lodged and unable to pass.  Had EGD.  He had some emesis and hematemesis.  Required ICU placement with intubation.  Discharged on protonix bid.  Since his discharge he has had no dysphagia.  He is eating soft foods only.  Able to take his pills without problems.  No acid reflux.  No abdominal pain or cramping.  Bowels stable.  Blood pressure doing well.  No sob.  Planning for EGD 01/04/16. Just saw GI 12/19/15.  Note reviewed.  No changes made.    Past Medical History:  Diagnosis Date  . Abnormal liver function tests   . Allergic state   . Arthritis    spine, hands, shoulder with previous cuff tear  . Colon polyp   . Diabetes mellitus ([place])    type 2 since 09/16/2011  . Elevated transaminase level   . GERD (gastroesophageal reflux disease)   . Hiatal hernia   . Hypercholesterolemia   . Hypertension   . Psoriasis   . Psoriatic arthritis ([place])   . Sleep apnea    Past Surgical History:  Procedure Laterality Date  . ARTHROPLASTY     right thumb; left thumb  . BALLOON DILATION N/A 05/29/2015   Procedure: BALLOON DILATION;  Surgeon: Lollie Sails, MD;  Location: [facility] ENDOSCOPY;  Service: Endoscopy;  Laterality: N/A;  . BLEPHAROPLASTY    . CARPAL TUNNEL RELEASE     right  . CHOLECYSTECTOMY    . COLONOSCOPY    . CYST EXCISION  04/14/2015   tendon sheath cyst excision; right ring finger cyst removed and trigger finger realease  . ESOPHAGOGASTRODUODENOSCOPY    . ESOPHAGOGASTRODUODENOSCOPY (EGD) WITH PROPOFOL N/A 05/29/2015   Procedure: ESOPHAGOGASTRODUODENOSCOPY (EGD) WITH PROPOFOL;  Surgeon: Lollie Sails, MD;  Location: [facility] ENDOSCOPY;  Service: Endoscopy;  Laterality: N/A;  . ESOPHAGOGASTRODUODENOSCOPY (EGD) WITH  PROPOFOL N/A 07/04/2015   Procedure: ESOPHAGOGASTRODUODENOSCOPY (EGD) WITH PROPOFOL;  Surgeon: Lollie Sails, MD;  Location: [facility] ENDOSCOPY;  Service: Endoscopy;  Laterality: N/A;  . FOREIGN BODY REMOVAL N/A 12/03/2015   Procedure: FOREIGN BODY REMOVAL;  Surgeon: Wilford Corner, MD;  Location: [facility] ENDOSCOPY;  Service: Endoscopy;  Laterality: N/A;  . HERNIA REPAIR    . LUMBAR DISC SURGERY    . NASAL SINUS SURGERY    . ROTATOR CUFF REPAIR    . TONSILLECTOMY     Family History  Problem Relation Age of Onset  . Lung cancer Father   . Hypertension Mother    Social History   Social History  . Marital status: Married    Spouse name: N/A  . Number of children: N/A  . Years of education: N/A   Social History Main Topics  . Smoking status: Former Smoker    Quit date: 07/06/1982  . Smokeless tobacco: Never Used  . Alcohol use No  . Drug use: No  . Sexual activity: Not Asked   Other Topics Concern  . None   Social History Narrative  . None    Outpatient Encounter Prescriptions as of 12/22/2015  Medication Sig  . atenolol (TENORMIN) 50 MG tablet TAKE 1 TABLET BY MOUTH TWICE DAILY  . COSENTYX SENSOREADY PEN 150 MG/ML SOAJ  Inject 1 Dose into the skin every 30 (thirty) days. Due 46/50/3546  . folic acid (FOLVITE) 1 MG tablet Take 1 mg by mouth daily.  Marland Kitchen gabapentin (NEURONTIN) 300 MG capsule Take 300 mg by mouth 3 (three) times daily as needed.  . hydrochlorothiazide (HYDRODIURIL) 25 MG tablet TAKE 1 TABLET BY MOUTH EVERY DAY  . HYDROcodone-homatropine (HYCODAN) 5-1.5 MG/5ML syrup Take 5 mLs by mouth at bedtime as needed for cough.  . losartan (COZAAR) 50 MG tablet TAKE 1 TABLET BY MOUTH EVERY DAY  . metFORMIN (GLUCOPHAGE) 500 MG tablet TAKE 4 TABLETS BY MOUTH EVERY EVENING. (Patient taking differently: TAKE 2 TABLETS TWO TIMES DAILY)  . Multiple Vitamin (MULTIVITAMIN) tablet Take 1 tablet by mouth daily.  . pantoprazole (PROTONIX) 40 MG tablet Take 1 tablet (40 mg total) by mouth  2 (two) times daily.  . traMADol (ULTRAM) 50 MG tablet Take 1 tablet (50 mg total) by mouth 2 (two) times daily as needed.  . vitamin E 400 UNIT capsule Take 400 Units by mouth daily.  Marland Kitchen azithromycin (ZITHROMAX) 500 MG tablet Take 1 tablet (500 mg total) by mouth daily. (Patient not taking: Reported on 12/22/2015)   No facility-administered encounter medications on file as of 12/22/2015.     Review of Systems  Constitutional: Negative for unexpected weight change.       Eating only soft food now.    HENT: Negative for congestion and sinus pressure.   Respiratory: Negative for cough, chest tightness and shortness of breath.   Cardiovascular: Negative for chest pain, palpitations and leg swelling.  Gastrointestinal: Negative for abdominal pain, diarrhea, nausea and vomiting.  Genitourinary: Negative for difficulty urinating and dysuria.  Musculoskeletal: Negative for myalgias and neck pain.  Skin: Negative for color change and rash.  Neurological: Negative for dizziness, light-headedness and headaches.  Psychiatric/Behavioral: Negative for agitation and dysphoric mood.       Objective:     Blood pressure rechecked by me:  120/74  Physical Exam  Constitutional: He appears well-developed and well-nourished. No distress.  HENT:  Nose: Nose normal.  Mouth/Throat: Oropharynx is clear and moist.  Neck: Neck supple. No thyromegaly present.  Cardiovascular: Normal rate and regular rhythm.   Pulmonary/Chest: Effort normal and breath sounds normal. No respiratory distress.  Abdominal: Soft. Bowel sounds are normal. There is no tenderness.  Musculoskeletal: He exhibits no edema or tenderness.  Lymphadenopathy:    He has no cervical adenopathy.  Skin: No rash noted. No erythema.  Psychiatric: He has a normal mood and affect. His behavior is normal.    BP 118/78 (BP Location: Left Arm, Patient Position: Sitting, Cuff Size: Large)   Pulse 90   Temp 98.5 F (36.9 C) (Oral)   Resp 18   Wt  198 lb (89.8 kg)   SpO2 98%   BMI 30.11 kg/m  Wt Readings from Last 3 Encounters:  12/22/15 198 lb (89.8 kg)  12/05/15 192 lb 8 oz (87.3 kg)  11/21/15 199 lb 9.6 oz (90.5 kg)     Lab Results  Component Value Date   WBC 5.7 12/07/2015   HGB 12.3 (L) 12/07/2015   HCT 34.8 (L) 12/07/2015   PLT 167 12/07/2015   GLUCOSE 132 (H) 12/06/2015   CHOL 162 08/04/2015   TRIG 181 (H) 12/03/2015   HDL 35.90 (L) 08/04/2015   LDLCALC 100 (H) 08/04/2015   ALT 34 12/03/2015   AST 33 12/03/2015   NA 138 12/06/2015   K 4.0 12/06/2015   CL 109  12/06/2015   CREATININE 0.82 12/06/2015   BUN <5 (L) 12/06/2015   CO2 25 12/06/2015   TSH 1.93 04/07/2015   PSA 1.50 04/07/2015   INR 1.15 12/03/2015   HGBA1C 6.2 08/04/2015   MICROALBUR 2.3 (H) 08/04/2015    Dg Chest 1 View  Result Date: 12/03/2015 CLINICAL DATA:  Hypoxia EXAM: CHEST 1 VIEW COMPARISON:  Study obtained earlier in the day FINDINGS: Endotracheal tube tip is 1.1 cm above the carina. No pneumothorax. There is left base atelectasis. Lungs elsewhere are clear. Heart size and pulmonary vascularity are normal. No adenopathy. There is a small hiatal hernia. Stomach is distended with air. IMPRESSION: Endotracheal tube as described without pneumothorax. Left base atelectasis. Lungs elsewhere clear. Small hiatal hernia. Stomach distended with air. Electronically Signed   By: Lowella Grip III M.D.   On: 12/03/2015 20:27   Dg Chest 2 View  Result Date: 12/03/2015 CLINICAL DATA:  Chest tightness. EXAM: CHEST  2 VIEW COMPARISON:  11/23/2015 FINDINGS: Heart and mediastinal contours are within normal limits. No focal opacities or effusions. No acute bony abnormality. Small hiatal hernia. IMPRESSION: No active cardiopulmonary disease. Electronically Signed   By: Rolm Baptise M.D.   On: 12/03/2015 15:32   Ct Chest W Contrast  Result Date: 12/04/2015 CLINICAL DATA:  Acute onset of dysphagia. Largest adherent clot noted at the gastroesophageal  junction, concerning for a large Mallory-Weiss tear or Boerhaave's. Evaluate for perforation. Initial encounter. EXAM: CT CHEST AND ABDOMEN WITH CONTRAST TECHNIQUE: Multidetector CT imaging of the chest and abdomen was performed following the standard protocol during bolus administration of intravenous contrast. CONTRAST:  172m ISOVUE-300 IOPAMIDOL (ISOVUE-300) INJECTION 61% COMPARISON:  MRI of the lumbar spine performed 04/29/2014 FINDINGS: CT CHEST FINDINGS Cardiovascular: Diffuse coronary artery calcifications are seen. The heart remains borderline normal in size. Mild calcification is noted at the aortic arch. The great vessels are grossly unremarkable in appearance. Mediastinum/Nodes: There is no evidence of esophageal perforation. Diffuse Letts thickening is noted along the mid to distal esophagus and about the gastroesophageal junction, concerning for esophagitis. Mild surrounding soft tissue inflammation is noted. An underlying mass cannot be excluded. No mediastinal lymphadenopathy is seen. No pericardial effusion is identified. The thyroid gland is unremarkable. No axillary lymphadenopathy is appreciated. The endotracheal tube is seen ending 2 cm above the carina. The endotracheal tube balloon appears mildly overinflated. Lungs/Pleura: Mild patchy bibasilar airspace opacification may reflect atelectasis or possibly mild infection. No pleural effusion or pneumothorax is seen. No dominant mass is identified. Musculoskeletal: No acute osseous abnormalities are identified. The visualized musculature is unremarkable in appearance. CT ABDOMEN PELVIS FINDINGS Hepatobiliary: The liver is unremarkable in appearance. The gallbladder is unremarkable in appearance. The common bile duct remains normal in caliber. Pancreas: The pancreas is within normal limits. Spleen: The spleen is unremarkable in appearance. Adrenals/Urinary Tract: The adrenal glands are unremarkable in appearance. The kidneys are within normal  limits. There is no evidence of hydronephrosis. No renal or ureteral stones are identified. Nonspecific perinephric stranding is noted bilaterally. Stomach/Bowel: The stomach is unremarkable in appearance. The visualized portions of the small bowel are within normal limits. The appendix is normal in caliber, without evidence of appendicitis. The visualized portions of the colon are unremarkable in appearance. Vascular/Lymphatic: Mild calcification is noted along the abdominal aorta. No retroperitoneal lymphadenopathy is seen. Other: No additional soft tissue abnormalities are seen. Musculoskeletal: No acute osseous abnormalities are identified. Multilevel vacuum phenomenon is noted along the lower thoracic and lumbar spine. The visualized musculature  is unremarkable in appearance. IMPRESSION: 1. No definite evidence of esophageal perforation. Diffuse Varin thickening along the mid to distal esophagus and about the gastroesophageal junction, concerning for esophagitis. Mild surrounding soft tissue inflammation noted. The suspected Mallory-Weiss tear is not well assessed on CT. Underlying mass cannot be excluded. Would correlate with the patient's symptoms. 2. Mild patchy bibasilar airspace opacification may reflect atelectasis or possibly mild infection. 3. Endotracheal tube seen ending 2 cm above the carina. The endotracheal tube balloon appears mildly overinflated. Would deflate slightly as deemed clinically appropriate. 4. Diffuse coronary artery calcifications seen. 5. Mild aortic atherosclerosis. 6. Mild degenerative change along the lower thoracic and lumbar spine. Electronically Signed   By: Garald Balding M.D.   On: 12/04/2015 00:16   Ct Abdomen W Contrast  Result Date: 12/04/2015 CLINICAL DATA:  Acute onset of dysphagia. Largest adherent clot noted at the gastroesophageal junction, concerning for a large Mallory-Weiss tear or Boerhaave's. Evaluate for perforation. Initial encounter. EXAM: CT CHEST AND  ABDOMEN WITH CONTRAST TECHNIQUE: Multidetector CT imaging of the chest and abdomen was performed following the standard protocol during bolus administration of intravenous contrast. CONTRAST:  164m ISOVUE-300 IOPAMIDOL (ISOVUE-300) INJECTION 61% COMPARISON:  MRI of the lumbar spine performed 04/29/2014 FINDINGS: CT CHEST FINDINGS Cardiovascular: Diffuse coronary artery calcifications are seen. The heart remains borderline normal in size. Mild calcification is noted at the aortic arch. The great vessels are grossly unremarkable in appearance. Mediastinum/Nodes: There is no evidence of esophageal perforation. Diffuse Hiegel thickening is noted along the mid to distal esophagus and about the gastroesophageal junction, concerning for esophagitis. Mild surrounding soft tissue inflammation is noted. An underlying mass cannot be excluded. No mediastinal lymphadenopathy is seen. No pericardial effusion is identified. The thyroid gland is unremarkable. No axillary lymphadenopathy is appreciated. The endotracheal tube is seen ending 2 cm above the carina. The endotracheal tube balloon appears mildly overinflated. Lungs/Pleura: Mild patchy bibasilar airspace opacification may reflect atelectasis or possibly mild infection. No pleural effusion or pneumothorax is seen. No dominant mass is identified. Musculoskeletal: No acute osseous abnormalities are identified. The visualized musculature is unremarkable in appearance. CT ABDOMEN PELVIS FINDINGS Hepatobiliary: The liver is unremarkable in appearance. The gallbladder is unremarkable in appearance. The common bile duct remains normal in caliber. Pancreas: The pancreas is within normal limits. Spleen: The spleen is unremarkable in appearance. Adrenals/Urinary Tract: The adrenal glands are unremarkable in appearance. The kidneys are within normal limits. There is no evidence of hydronephrosis. No renal or ureteral stones are identified. Nonspecific perinephric stranding is noted  bilaterally. Stomach/Bowel: The stomach is unremarkable in appearance. The visualized portions of the small bowel are within normal limits. The appendix is normal in caliber, without evidence of appendicitis. The visualized portions of the colon are unremarkable in appearance. Vascular/Lymphatic: Mild calcification is noted along the abdominal aorta. No retroperitoneal lymphadenopathy is seen. Other: No additional soft tissue abnormalities are seen. Musculoskeletal: No acute osseous abnormalities are identified. Multilevel vacuum phenomenon is noted along the lower thoracic and lumbar spine. The visualized musculature is unremarkable in appearance. IMPRESSION: 1. No definite evidence of esophageal perforation. Diffuse Demarco thickening along the mid to distal esophagus and about the gastroesophageal junction, concerning for esophagitis. Mild surrounding soft tissue inflammation noted. The suspected Mallory-Weiss tear is not well assessed on CT. Underlying mass cannot be excluded. Would correlate with the patient's symptoms. 2. Mild patchy bibasilar airspace opacification may reflect atelectasis or possibly mild infection. 3. Endotracheal tube seen ending 2 cm above the carina. The endotracheal  tube balloon appears mildly overinflated. Would deflate slightly as deemed clinically appropriate. 4. Diffuse coronary artery calcifications seen. 5. Mild aortic atherosclerosis. 6. Mild degenerative change along the lower thoracic and lumbar spine. Electronically Signed   By: Garald Balding M.D.   On: 12/04/2015 00:16   Dg Chest Port 1 View  Result Date: 12/04/2015 CLINICAL DATA:  Central line placement. EXAM: PORTABLE CHEST 1 VIEW COMPARISON:  12/04/2015 . FINDINGS: Endotracheal tube, left IJ line in stable position. Cardiomegaly with normal pulmonary vascularity. Low lung volumes with mild bibasilar atelectasis. No pleural effusion or pneumothorax. IMPRESSION: 1. Lines and tubes in stable position. 2. Low lung volumes  with mild bibasilar atelectasis. Electronically Signed   By: Marcello Moores  Register   On: 12/04/2015 09:18   Portable Chest Xray  Result Date: 12/04/2015 CLINICAL DATA:  Respiratory failure, history of diabetes, gastroesophageal reflux, remote history of smoking. EXAM: PORTABLE CHEST 1 VIEW COMPARISON:  CT scan of the chest of December 03, 2015 and chest x-ray of the same date FINDINGS: The right hemidiaphragm is higher than the left. There remains gaseous distention of the stomach. There is patchy density at the left lung base consistent with atelectasis. There is no pleural effusion. The cardiac silhouette is enlarged. The pulmonary vascularity is not engorged. The endotracheal tube tip lies 3.7 cm above the carina. There is mild tortuosity of the descending thoracic aorta. IMPRESSION: Bilateral hypo inflation. Left lower lobe atelectasis or early pneumonia. Cardiomegaly without pulmonary edema. The support tubes are in reasonable position. No significant mediastinal widening allowing for the hypo inflation. Persistent gaseous distention of the stomach. Electronically Signed   By: David  Martinique M.D.   On: 12/04/2015 07:19       Assessment & Plan:   Problem List Items Addressed This Visit    Diabetes mellitus ([place])    Low carb diet and exercise.  Follow met b and a1c.  On metformin.  No problems swallowing.        Dysphagia    Recently admitted as outlined.  Now eating soft foods.  Just saw GI.  Planning for f/u EGD 01/04/16.        Food impaction of esophagus    Recently admitted with food impaction.  Now eating soft foods.  Planning for EGD 01/04/16.        GERD (gastroesophageal reflux disease)    Last EGD 07/04/15 as outlined.  Recently admitted with dysphagia as outlined.  Planning for f/u EGD 01/04/16.  No acid reflux.  No protonix.        Hypertension    Blood pressure under good control.  Continue same medication regimen.  Follow pressures.  Follow metabolic panel.        Psoriatic  arthritis ([place])    Followed by rheumatology.  Followed by Dr Jefm Bryant.            Einar Pheasant, MD

## 2015-12-24 ENCOUNTER — Encounter: Payer: Self-pay | Admitting: Internal Medicine

## 2015-12-24 NOTE — Assessment & Plan Note (Signed)
Recently admitted as outlined.  Now eating soft foods.  Just saw GI.  Planning for f/u EGD 01/04/16.

## 2015-12-24 NOTE — Assessment & Plan Note (Signed)
Recently admitted with food impaction.  Now eating soft foods.  Planning for EGD 01/04/16.

## 2015-12-24 NOTE — Assessment & Plan Note (Signed)
Blood pressure under good control.  Continue same medication regimen.  Follow pressures.  Follow metabolic panel.   

## 2015-12-24 NOTE — Assessment & Plan Note (Signed)
Low carb diet and exercise.  Follow met b and a1c.  On metformin.  No problems swallowing.

## 2015-12-24 NOTE — Assessment & Plan Note (Signed)
Last EGD 07/04/15 as outlined.  Recently admitted with dysphagia as outlined.  Planning for f/u EGD 01/04/16.  No acid reflux.  No protonix.

## 2015-12-24 NOTE — Assessment & Plan Note (Signed)
Followed by rheumatology.  Followed by Dr Jefm Bryant.

## 2016-01-04 ENCOUNTER — Encounter
Admission: RE | Disposition: A | Discharge status: Home / Self Care | Payer: Self-pay | Source: Ambulatory Visit | Attending: Gastroenterology

## 2016-01-04 ENCOUNTER — Ambulatory Visit: Payer: BLUE CROSS/BLUE SHIELD | Admitting: Anesthesiology

## 2016-01-04 ENCOUNTER — Ambulatory Visit
Admission: RE | Admit: 2016-01-04 | Discharge: 2016-01-04 | Disposition: A | Discharge status: Home / Self Care | Payer: BLUE CROSS/BLUE SHIELD | Source: Ambulatory Visit | Attending: Gastroenterology | Admitting: Gastroenterology

## 2016-01-04 ENCOUNTER — Encounter: Payer: Self-pay | Admitting: *Deleted

## 2016-01-04 DIAGNOSIS — K219 Gastro-esophageal reflux disease without esophagitis: Secondary | ICD-10-CM | POA: Diagnosis not present

## 2016-01-04 DIAGNOSIS — Z91048 Other nonmedicinal substance allergy status: Secondary | ICD-10-CM | POA: Insufficient documentation

## 2016-01-04 DIAGNOSIS — Z8719 Personal history of other diseases of the digestive system: Secondary | ICD-10-CM | POA: Diagnosis present

## 2016-01-04 DIAGNOSIS — M19041 Primary osteoarthritis, right hand: Secondary | ICD-10-CM | POA: Diagnosis not present

## 2016-01-04 DIAGNOSIS — Z91013 Allergy to seafood: Secondary | ICD-10-CM | POA: Diagnosis not present

## 2016-01-04 DIAGNOSIS — I10 Essential (primary) hypertension: Secondary | ICD-10-CM | POA: Diagnosis not present

## 2016-01-04 DIAGNOSIS — R1310 Dysphagia, unspecified: Secondary | ICD-10-CM | POA: Insufficient documentation

## 2016-01-04 DIAGNOSIS — M19042 Primary osteoarthritis, left hand: Secondary | ICD-10-CM | POA: Diagnosis not present

## 2016-01-04 DIAGNOSIS — E119 Type 2 diabetes mellitus without complications: Secondary | ICD-10-CM | POA: Insufficient documentation

## 2016-01-04 DIAGNOSIS — Z86010 Personal history of colonic polyps: Secondary | ICD-10-CM | POA: Insufficient documentation

## 2016-01-04 DIAGNOSIS — M479 Spondylosis, unspecified: Secondary | ICD-10-CM | POA: Insufficient documentation

## 2016-01-04 DIAGNOSIS — Z886 Allergy status to analgesic agent status: Secondary | ICD-10-CM | POA: Insufficient documentation

## 2016-01-04 DIAGNOSIS — Q399 Congenital malformation of esophagus, unspecified: Secondary | ICD-10-CM | POA: Insufficient documentation

## 2016-01-04 DIAGNOSIS — Z7984 Long term (current) use of oral hypoglycemic drugs: Secondary | ICD-10-CM | POA: Insufficient documentation

## 2016-01-04 DIAGNOSIS — G4730 Sleep apnea, unspecified: Secondary | ICD-10-CM | POA: Insufficient documentation

## 2016-01-04 DIAGNOSIS — K449 Diaphragmatic hernia without obstruction or gangrene: Secondary | ICD-10-CM | POA: Insufficient documentation

## 2016-01-04 DIAGNOSIS — Z888 Allergy status to other drugs, medicaments and biological substances status: Secondary | ICD-10-CM | POA: Diagnosis not present

## 2016-01-04 DIAGNOSIS — Z880 Allergy status to penicillin: Secondary | ICD-10-CM | POA: Diagnosis not present

## 2016-01-04 DIAGNOSIS — E7800 Pure hypercholesterolemia, unspecified: Secondary | ICD-10-CM | POA: Diagnosis not present

## 2016-01-04 DIAGNOSIS — L4050 Arthropathic psoriasis, unspecified: Secondary | ICD-10-CM | POA: Insufficient documentation

## 2016-01-04 HISTORY — PX: ESOPHAGOGASTRODUODENOSCOPY (EGD) WITH PROPOFOL: SHX5813

## 2016-01-04 LAB — GLUCOSE, CAPILLARY: Glucose-Capillary: 129 mg/dL — ABNORMAL HIGH (ref 65–99)

## 2016-01-04 SURGERY — ESOPHAGOGASTRODUODENOSCOPY (EGD) WITH PROPOFOL
Anesthesia: General

## 2016-01-04 MED ORDER — PROPOFOL 10 MG/ML IV BOLUS
INTRAVENOUS | Status: DC | PRN
  Administered 2016-01-04: 100 mg via INTRAVENOUS

## 2016-01-04 MED ORDER — MIDAZOLAM HCL 2 MG/2ML IJ SOLN
INTRAMUSCULAR | Status: AC
Start: 2016-01-04 — End: 2016-01-04
  Filled 2016-01-04: qty 2

## 2016-01-04 MED ORDER — SODIUM CHLORIDE 0.9 % IV SOLN
INTRAVENOUS | Status: DC
  Administered 2016-01-04: 09:00:00 via INTRAVENOUS

## 2016-01-04 MED ORDER — MIDAZOLAM HCL 5 MG/5ML IJ SOLN
INTRAMUSCULAR | Status: DC | PRN
  Administered 2016-01-04: 1 mg via INTRAVENOUS

## 2016-01-04 MED ORDER — PROPOFOL 500 MG/50ML IV EMUL
INTRAVENOUS | Status: AC
  Filled 2016-01-04: qty 100

## 2016-01-04 MED ORDER — LIDOCAINE 2% (20 MG/ML) 5 ML SYRINGE
INTRAMUSCULAR | Status: DC | PRN
  Administered 2016-01-04: 40 mg via INTRAVENOUS

## 2016-01-04 MED ORDER — FENTANYL CITRATE (PF) 100 MCG/2ML IJ SOLN
INTRAMUSCULAR | Status: AC
  Filled 2016-01-04: qty 2

## 2016-01-04 MED ORDER — PHENYLEPHRINE HCL 10 MG/ML IJ SOLN: INTRAMUSCULAR | Status: DC | PRN

## 2016-01-04 MED ORDER — PROPOFOL 500 MG/50ML IV EMUL
INTRAVENOUS | Status: DC | PRN
  Administered 2016-01-04: 140 ug/kg/min via INTRAVENOUS

## 2016-01-04 MED ORDER — SODIUM CHLORIDE 0.9 % IV SOLN: INTRAVENOUS | Status: DC

## 2016-01-04 MED ORDER — FENTANYL CITRATE (PF) 100 MCG/2ML IJ SOLN
INTRAMUSCULAR | Status: DC | PRN
  Administered 2016-01-04: 50 ug via INTRAVENOUS

## 2016-01-04 NOTE — Transfer of Care (Signed)
Immediate Anesthesia Transfer of Care Note  Patient: Shane Mcknight  Procedure(s) Performed: Procedure(s): ESOPHAGOGASTRODUODENOSCOPY (EGD) WITH PROPOFOL (N/A)  Patient Location: PACU and Endoscopy Unit  Anesthesia Type:General  Level of Consciousness: sedated  Airway & Oxygen Therapy: Patient Spontanous Breathing and Patient connected to nasal cannula oxygen  Post-op Assessment: Report given to RN and Post -op Vital signs reviewed and stable  Post vital signs: Reviewed and stable  Last Vitals:  Vitals:   01/04/16 0923  BP: 119/79  Pulse: 64  Resp: 20  Temp: (!) 36.1 C    Last Pain:  Vitals:   01/04/16 0923  TempSrc: Tympanic      Patients Stated Pain Goal: 0 ([id number])  Complications: No apparent anesthesia complications

## 2016-01-04 NOTE — Anesthesia Preprocedure Evaluation (Signed)
Anesthesia Evaluation  Patient identified by MRN, date of birth, ID band Patient awake    Reviewed: Allergy & Precautions, NPO status , Patient's Chart, lab work & pertinent test results  History of Anesthesia Complications Negative for: history of anesthetic complications  Airway Mallampati: II  TM Distance: >3 FB Neck ROM: Full    Dental  (+) Lower Dentures, Upper Dentures   Pulmonary sleep apnea (does not use CPAP, has lost significant amount of weight since initially diagnosed) , neg COPD, former smoker,    breath sounds clear to auscultation- rhonchi (-) wheezing      Cardiovascular Exercise Tolerance: Good hypertension, Pt. on medications (-) CAD and (-) Past MI  Rhythm:Regular Rate:Normal - Systolic murmurs and - Diastolic murmurs    Neuro/Psych negative psych ROS   GI/Hepatic Neg liver ROS, hiatal hernia, GERD  ,  Endo/Other  diabetes, Type 2, Oral Hypoglycemic Agents  Renal/GU negative Renal ROS     Musculoskeletal  (+) Arthritis ,   Abdominal (+) + obese,   Peds  Hematology  (+) anemia ,   Anesthesia Other Findings Past Medical History: No date: Abnormal liver function tests No date: Allergic state No date: Arthritis     Comment: spine, hands, shoulder with previous cuff tear No date: Colon polyp No date: Diabetes mellitus ([place])     Comment: type 2 since 09/16/2011 No date: Elevated transaminase level No date: GERD (gastroesophageal reflux disease) No date: Hiatal hernia No date: Hypercholesterolemia No date: Hypertension No date: Psoriasis No date: Psoriatic arthritis ([place]) No date: Sleep apnea   Reproductive/Obstetrics                             Anesthesia Physical Anesthesia Plan  ASA: III  Anesthesia Plan: General   Post-op Pain Management:    Induction: Intravenous  Airway Management Planned: Natural Airway  Additional Equipment:   Intra-op Plan:    Post-operative Plan:   Informed Consent: I have reviewed the patients History and Physical, chart, labs and discussed the procedure including the risks, benefits and alternatives for the proposed anesthesia with the patient or authorized representative who has indicated his/her understanding and acceptance.   Dental advisory given  Plan Discussed with: CRNA and Anesthesiologist  Anesthesia Plan Comments:         Anesthesia Quick Evaluation

## 2016-01-04 NOTE — H&P (Signed)
Outpatient short stay form Pre-procedure 01/04/2016 9:22 AM Lollie Sails MD  Primary Physician: Dr. Einar Pheasant  Reason for visit:  EGD  History of present illness:  Patient is a 65 year old male with a history of dysphagia. He's had narrowing at the GE junction. About a month ago he had an episode of hematemesis following emesis with a food bolus. 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 He is presenting today for recheck of this and possible dilatation of the area of the GE junction. He has been taking a proton pump inhibitor, pantoprazole, twice a day. He's had no further episodes of emesis or food lodging although he is been very careful in eating mainly soft foods. He seen no black bowel movements.    Current Facility-Administered Medications:  .  0.9 %  sodium chloride infusion, , Intravenous, Continuous, Lollie Sails, MD .  0.9 %  sodium chloride infusion, , Intravenous, Continuous, Lollie Sails, MD .  0.9 %  sodium chloride infusion, , Intravenous, Continuous, Lollie Sails, MD  Prescriptions Prior to Admission  Medication Sig Dispense Refill Last Dose  . atenolol (TENORMIN) 50 MG tablet TAKE 1 TABLET BY MOUTH TWICE DAILY 60 tablet 7 01/04/2016 at Unknown time  . gabapentin (NEURONTIN) 300 MG capsule Take 300 mg by mouth 3 (three) times daily as needed.   01/03/2016 at Unknown time  . hydrochlorothiazide (HYDRODIURIL) 25 MG tablet TAKE 1 TABLET BY MOUTH EVERY DAY 30 tablet 11 01/04/2016 at Unknown time  . HYDROcodone-homatropine (HYCODAN) 5-1.5 MG/5ML syrup Take 5 mLs by mouth at bedtime as needed for cough. 30 mL 0 Past Month at Unknown time  . losartan (COZAAR) 50 MG tablet TAKE 1 TABLET BY MOUTH EVERY DAY 90 tablet 1 01/04/2016 at Unknown time  . metFORMIN (GLUCOPHAGE) 500 MG tablet TAKE 4 TABLETS BY MOUTH EVERY EVENING. (Patient taking differently: TAKE 2 TABLETS TWO TIMES DAILY) 120 tablet 11  01/03/2016 at Unknown time  . Multiple Vitamin (MULTIVITAMIN) tablet Take 1 tablet by mouth daily.   Past Month at Unknown time  . pantoprazole (PROTONIX) 40 MG tablet Take 1 tablet (40 mg total) by mouth 2 (two) times daily. 60 tablet 0 01/04/2016 at Unknown time  . traMADol (ULTRAM) 50 MG tablet Take 1 tablet (50 mg total) by mouth 2 (two) times daily as needed. 40 tablet 0 Past Month at Unknown time  . vitamin E 400 UNIT capsule Take 400 Units by mouth daily.   01/03/2016 at Unknown time  . azithromycin (ZITHROMAX) 500 MG tablet Take 1 tablet (500 mg total) by mouth daily. (Patient not taking: Reported on 01/04/2016) 4 tablet 0 Not Taking at Unknown time  . COSENTYX SENSOREADY PEN 150 MG/ML SOAJ Inject 1 Dose into the skin every 30 (thirty) days. Due 12/16/2015  10 [id number]  . folic acid (FOLVITE) 1 MG tablet Take 1 mg by mouth daily.   Taking     Allergies  Allergen Reactions  . Amoxicillin   . Ephedrine Other (See Comments)    pain  . Levsin [Hyoscyamine Sulfate] Other (See Comments)    Urinary retention  . Naprosyn [Naproxen] Other (See Comments)    GI upset   . Penicillins   . Sudafed [Pseudoephedrine Hcl]   . Vancomycin   . Other Rash    Paper tape  . Shellfish Allergy Rash     Past Medical History:  Diagnosis Date  . Abnormal liver function  tests   . Allergic state   . Arthritis    spine, hands, shoulder with previous cuff tear  . Colon polyp   . Diabetes mellitus ([place])    type 2 since 09/16/2011  . Elevated transaminase level   . GERD (gastroesophageal reflux disease)   . Hiatal hernia   . Hypercholesterolemia   . Hypertension   . Psoriasis   . Psoriatic arthritis ([place])   . Sleep apnea     Review of systems:      Physical Exam    Heart and lungs: Regular rate and rhythm without rub or gallop, lungs are bilaterally clear.    HEENT: Normocephalic atraumatic eyes are anicteric    Other:     Pertinant exam for procedure: Soft nontender nondistended  bowel sounds positive normoactive.    Planned proceedures: EGD and indicated procedures. I have discussed the risks benefits and complications of procedures to include not limited to bleeding, infection, perforation and the risk of sedation and the patient wishes to proceed.    Lollie Sails, MD Gastroenterology 01/04/2016  9:22 AM

## 2016-01-04 NOTE — Op Note (Signed)
[facility] Patient Name: Shane Mcknight Procedure Date: 01/04/2016 9:35 AM MRN: [id number] Account #: [id number] Date of Birth: 1950/11/25 Admit Type: Outpatient Age: 65 Room: [facility] ENDO ROOM 1 Gender: Male Note Status: Finalized Procedure:            Upper GI endoscopy Indications:          Dysphagia, follow-up Mallory Weiss tear. Providers:            Lollie Sails, MD Referring MD:         Einar Pheasant, MD (Referring MD) Medicines:            Monitored Anesthesia Care Complications:        No immediate complications. Procedure:            Pre-Anesthesia Assessment:                       - ASA Grade Assessment: III - A patient with severe                        systemic disease.                       After obtaining informed consent, the endoscope was                        passed under direct vision. Throughout the procedure,                        the patient's blood pressure, pulse, and oxygen                        saturations were monitored continuously. The Endoscope                        was introduced through the mouth, and advanced to the                        third part of duodenum. The upper GI endoscopy was                        accomplished without difficulty. The patient tolerated                        the procedure well. Findings:      The lower third of the esophagus was moderately tortuous.      A small hiatal hernia was found. The Z-line was a variable distance from       incisors; the hiatal hernia was sliding.      the previously seen Schatzki ring was resolved with a moderated sized       break noted in the ring. This appears to freshly healed/healing. There       was no evidence of a scar or other lesion in the cardia. Due to this       appearance I elected not to repeat the dilation at this time.      Diffuse mildly erythematous mucosa without bleeding was found in the       gastric body.      The examined  duodenum was normal. Impression:           - Tortuous esophagus.                       -  Small hiatal hernia.                       - Erythematous mucosa in the gastric body.                       - Normal examined duodenum.                       - No specimens collected. Recommendation:       - Advance diet as tolerated with appropriate                        preparation/cutting up, alternating solid and liquid.                       - Return to GI clinic in 4 weeks. Procedure Code(s):    --- Professional ---                       [phone], Esophagogastroduodenoscopy, flexible, transoral;                        diagnostic, including collection of specimen(s) by                        brushing or washing, when performed (separate procedure) Diagnosis Code(s):    --- Professional ---                       Q39.9, Congenital malformation of esophagus, unspecified                       K44.9, Diaphragmatic hernia without obstruction or                        gangrene                       K31.89, Other diseases of stomach and duodenum                       R13.10, Dysphagia, unspecified CPT copyright 2016 American Medical Association. All rights reserved. The codes documented in this report are preliminary and upon coder review may  be revised to meet current compliance requirements. Lollie Sails, MD 01/04/2016 9:59:39 AM This report has been signed electronically. Number of Addenda: 0 Note Initiated On: 01/04/2016 9:35 AM      [facility]

## 2016-01-04 NOTE — Anesthesia Postprocedure Evaluation (Signed)
Anesthesia Post Note  Patient: Hipolito O Stuller  Procedure(s) Performed: Procedure(s) (LRB): ESOPHAGOGASTRODUODENOSCOPY (EGD) WITH PROPOFOL (N/A)  Patient location during evaluation: Endoscopy Anesthesia Type: General Level of consciousness: awake and alert and oriented Pain management: pain level controlled Vital Signs Assessment: post-procedure vital signs reviewed and stable Respiratory status: spontaneous breathing, nonlabored ventilation and respiratory function stable Cardiovascular status: blood pressure returned to baseline and stable Postop Assessment: no signs of nausea or vomiting Anesthetic complications: no     Last Vitals:  Vitals:   01/04/16 1000 01/04/16 1020  BP:  123/87  Pulse:    Resp:    Temp: 37.1 C     Last Pain:  Vitals:   01/04/16 0923  TempSrc: Tympanic                 Brigid Vandekamp

## 2016-01-05 ENCOUNTER — Encounter: Payer: Self-pay | Admitting: Gastroenterology

## 2016-01-10 ENCOUNTER — Telehealth: Payer: Self-pay | Admitting: Internal Medicine

## 2016-01-10 MED ORDER — ATENOLOL 50 MG PO TABS: 50.0000 mg | ORAL_TABLET | Freq: Two times a day (BID) | ORAL | 7 refills | Status: DC

## 2016-01-10 NOTE — Telephone Encounter (Signed)
RX sent to pharmacy  

## 2016-01-10 NOTE — Telephone Encounter (Signed)
Pt is requesting a refill on atenolol (TENORMIN) 50 MG tablet. Please advise, thank you!  Pharmacy - [facility] [place], [place] - [address]  Call pt @ [phone]

## 2016-01-22 ENCOUNTER — Telehealth: Payer: Self-pay | Admitting: Internal Medicine

## 2016-01-22 NOTE — Telephone Encounter (Signed)
Pt called c/o fever of 102.3 cough and sinus drainage. Sent call to team health triage.   Call pt @ [phone]

## 2016-01-22 NOTE — Telephone Encounter (Signed)
Spoke with patient he is going to [employer] in Clinic after lunch to get evaluated . Explained importance to patient of getting evaluated . Patient verbalized an understanding.

## 2016-01-22 NOTE — Telephone Encounter (Signed)
Left message to call to follow up to see if he went to urgent care.

## 2016-01-22 NOTE — Telephone Encounter (Signed)
Spoke with patient and advised that he needs to be seen and evaluated today. Patient agreed to go to  walk in [facility].   Patient verbalized an verbalized an understanding .

## 2016-01-22 NOTE — Telephone Encounter (Signed)
With fever, etc, needs to be seen today to know how to treat.  If no appts here, then I recommend acute care clinic and then we can f/u after.  He needs to be seen given history and current medications taking - to be treated appropriately.

## 2016-01-22 NOTE — Telephone Encounter (Signed)
Patient Name: Shane Mcknight  DOB: 1950/05/07    Initial Comment Caller states has a fever of 102.3 , cough, drainage. patients has been like this for about a week or so. The fever is new. When he coughs his neck hurts. The back of his neck hurts.   Nurse Assessment  Nurse: Orvan Seen, RN, Jacquilin Date/Time (Eastern Time): 01/22/2016 11:18:59 AM  Confirm and document reason for call. If symptomatic, describe symptoms. ---Caller states has a fever of 102.3 , cough, drainage that returned yesterday. When he coughs his neck hurts. The back of his neck hurts. No SOB or chest pain  Does the patient have any new or worsening symptoms? ---Yes  Will a triage be completed? ---Yes  Related visit to physician within the last 2 weeks? ---No  Does the PT have any chronic conditions? (i.e. diabetes, asthma, etc.) ---Yes  List chronic conditions. ---diabetic, htn,  Is this a behavioral health or substance abuse call? ---No     Guidelines    Guideline Title Affirmed Question Affirmed Notes  Cough - Acute Productive [1] Fever > 101 F (38.3 C) AND [2] age > 66    Final Disposition User   See Physician within 4 Hours (or PCP triage) Orvan Seen, RN, Jacquilin    Referrals  [facility] REFUSED   Disagree/Comply: Disagree  Disagree/Comply Reason: Disagree with instructions   Refused outcome- Caller wants to be seen in office

## 2016-01-22 NOTE — Telephone Encounter (Signed)
FYI

## 2016-01-22 NOTE — Telephone Encounter (Signed)
Awaiting Team Health Triage Note.

## 2016-01-23 NOTE — Telephone Encounter (Signed)
Spoke with patient he went to walk in clinic and was evaluated.

## 2016-01-23 NOTE — Telephone Encounter (Signed)
Spoke with patient he states went walk in clinic.  He was positive for flu and has bronchial infection.  He is being treated with antibiotic and Prednisone.

## 2016-01-23 NOTE — Telephone Encounter (Signed)
Patient stated that he received a call from [place] after 5pm on 01/09. He can be reached at [phone]

## 2016-02-04 ENCOUNTER — Encounter: Payer: Self-pay | Admitting: Internal Medicine

## 2016-02-05 ENCOUNTER — Encounter: Payer: Self-pay | Admitting: Internal Medicine

## 2016-02-05 ENCOUNTER — Telehealth: Payer: Self-pay | Admitting: *Deleted

## 2016-02-05 NOTE — Telephone Encounter (Signed)
I sent you a my chart message on this pt.  I can see him tomorrow at 3:30 to recheck blood pressure and see if medications need to be changed (and any other treatment).  Confirm no acute symptoms and confirm ok to wait until tomorrow for visit.  Thanks.

## 2016-02-05 NOTE — Telephone Encounter (Signed)
Patient states earlier in the weak was having dizziness when blood pressure was low.  Patient does complain of head congestion and cough, is getting some better.  Please advise.

## 2016-02-05 NOTE — Telephone Encounter (Signed)
See phone message.  Addressed in phone message.

## 2016-02-05 NOTE — Telephone Encounter (Signed)
Patient scheduled for 3.30 to morrow as requested. No acute symptoms patient stated he feels fine today so far. FYI

## 2016-02-05 NOTE — Telephone Encounter (Signed)
Patient scheduled see phone note.

## 2016-02-05 NOTE — Telephone Encounter (Signed)
Pt called stating that he had blood pressure readings from this morning 0845 124/82 pulse 75 0900 132/105 pulse 77 0900 second check 143/88 pulse 78 0946 114/82 pulse 70

## 2016-02-05 NOTE — Telephone Encounter (Signed)
If no acute symptoms now and he is ok to wait, I would like to see him for evaluation.  See if he can come in tomorrow at 3:30 - block 30 min.

## 2016-02-05 NOTE — Telephone Encounter (Signed)
See phone message and my chart message.  Addressed in message.  Pt has appt with me tomorrow.

## 2016-02-06 ENCOUNTER — Encounter: Payer: Self-pay | Admitting: Internal Medicine

## 2016-02-06 ENCOUNTER — Ambulatory Visit (INDEPENDENT_AMBULATORY_CARE_PROVIDER_SITE_OTHER): Payer: BLUE CROSS/BLUE SHIELD | Admitting: Internal Medicine

## 2016-02-06 VITALS — BP 118/84 | HR 75 | Temp 98.3°F | Resp 16 | Wt 189.5 lb

## 2016-02-06 DIAGNOSIS — E119 Type 2 diabetes mellitus without complications: Secondary | ICD-10-CM

## 2016-02-06 DIAGNOSIS — R197 Diarrhea, unspecified: Secondary | ICD-10-CM

## 2016-02-06 DIAGNOSIS — L4050 Arthropathic psoriasis, unspecified: Secondary | ICD-10-CM

## 2016-02-06 DIAGNOSIS — R05 Cough: Secondary | ICD-10-CM | POA: Diagnosis not present

## 2016-02-06 DIAGNOSIS — R42 Dizziness and giddiness: Secondary | ICD-10-CM

## 2016-02-06 DIAGNOSIS — I10 Essential (primary) hypertension: Secondary | ICD-10-CM

## 2016-02-06 DIAGNOSIS — K219 Gastro-esophageal reflux disease without esophagitis: Secondary | ICD-10-CM

## 2016-02-06 DIAGNOSIS — R059 Cough, unspecified: Secondary | ICD-10-CM

## 2016-02-06 MED ORDER — PREDNISONE 10 MG PO TABS: ORAL_TABLET | ORAL | 0 refills | Status: DC

## 2016-02-06 NOTE — Progress Notes (Addendum)
Patient ID: Shane Mcknight, male   DOB: [birth year], 66 y.o.   MRN: [id number]   Subjective:    Patient ID: Shane Mcknight, male    DOB: [birth year], 66 y.o.   MRN: [id number]  HPI  Patient here as a work in with concerns regarding persistent increased cough and congestion and low blood pressure.  Had fever.  Was seen at [facility].  Nasal swab positive.  Treated with tamiflu.  Reevaluated secondary to persistent symptoms.  Was placed on abx, prednisone and cough syrup.  He reports persistent cough and congestion.  Also persistent loose stool.  Some light headedness.  States has occurred after sitting watching TV.  Got up - dizzy.  Has noticed some intermittent light headedness.  Worse with movement and change in positions.  No chest pain.  No nausea or vomiting.     Past Medical History:  Diagnosis Date  . Abnormal liver function tests   . Allergic state   . Arthritis    spine, hands, shoulder with previous cuff tear  . Colon polyp   . Diabetes mellitus ([place])    type 2 since 09/16/2011  . Elevated transaminase level   . GERD (gastroesophageal reflux disease)   . Hiatal hernia   . Hypercholesterolemia   . Hypertension   . Psoriasis   . Psoriatic arthritis ([place])   . Sleep apnea    Past Surgical History:  Procedure Laterality Date  . ARTHROPLASTY     right thumb; left thumb  . BALLOON DILATION N/A 05/29/2015   Procedure: BALLOON DILATION;  Surgeon: Lollie Sails, MD;  Location[facility] ENDOSCOPY;  Service: Endoscopy;  Laterality: N/A;  . BLEPHAROPLASTY    . CARPAL TUNNEL RELEASE     right  . CHOLECYSTECTOMY    . COLONOSCOPY    . CYST EXCISION  04/14/2015   tendon sheath cyst excision; right ring finger cyst removed and trigger finger realease  . ESOPHAGOGASTRODUODENOSCOPY    . ESOPHAGOGASTRODUODENOSCOPY (EGD) WITH PROPOFOL N/A 05/29/2015   Procedure: ESOPHAGOGASTRODUODENOSCOPY (EGD) WITH PROPOFOL;  Surgeon: Lollie Sails, MD;  Location: [facility] ENDOSCOPY;  Service: Endoscopy;  Laterality: N/A;   . ESOPHAGOGASTRODUODENOSCOPY (EGD) WITH PROPOFOL N/A 07/04/2015   Procedure: ESOPHAGOGASTRODUODENOSCOPY (EGD) WITH PROPOFOL;  Surgeon: Lollie Sails, MD;  Location: [facility] ENDOSCOPY;  Service: Endoscopy;  Laterality: N/A;  . ESOPHAGOGASTRODUODENOSCOPY (EGD) WITH PROPOFOL N/A 01/04/2016   Procedure: ESOPHAGOGASTRODUODENOSCOPY (EGD) WITH PROPOFOL;  Surgeon: Lollie Sails, MD;  Location: [facility] ENDOSCOPY;  Service: Endoscopy;  Laterality: N/A;  . FOREIGN BODY REMOVAL N/A 12/03/2015   Procedure: FOREIGN BODY REMOVAL;  Surgeon: Wilford Corner, MD;  Location: [facility] ENDOSCOPY;  Service: Endoscopy;  Laterality: N/A;  . HERNIA REPAIR    . LUMBAR DISC SURGERY    . NASAL SINUS SURGERY    . ROTATOR CUFF REPAIR    . TONSILLECTOMY     Family History  Problem Relation Age of Onset  . Lung cancer Father   . Hypertension Mother    Social History   Social History  . Marital status: Married    Spouse name: N/A  . Number of children: N/A  . Years of education: N/A   Social History Main Topics  . Smoking status: Former Smoker    Quit date: 07/06/1982  . Smokeless tobacco: Never Used  . Alcohol use No  . Drug use: No  . Sexual activity: Not Asked   Other Topics Concern  . None   Social History Narrative  . None  Outpatient Encounter Prescriptions as of 02/06/2016  Medication Sig  . atenolol (TENORMIN) 50 MG tablet Take 1 tablet (50 mg total) by mouth 2 (two) times daily.  [place] SENSOREADY PEN 150 MG/ML SOAJ Inject 1 Dose into the skin every 30 (thirty) days. Due 97/41/6384  . folic acid (FOLVITE) 1 MG tablet Take 1 mg by mouth daily.  Marland Kitchen gabapentin (NEURONTIN) 300 MG capsule Take 300 mg by mouth 3 (three) times daily as needed.  . hydrochlorothiazide (HYDRODIURIL) 25 MG tablet TAKE 1 TABLET BY MOUTH EVERY DAY  . losartan (COZAAR) 50 MG tablet TAKE 1 TABLET BY MOUTH EVERY DAY  . metFORMIN (GLUCOPHAGE) 500 MG tablet TAKE 4 TABLETS BY MOUTH EVERY EVENING. (Patient taking differently:  TAKE 2 TABLETS TWO TIMES DAILY)  . Multiple Vitamin (MULTIVITAMIN) tablet Take 1 tablet by mouth daily.  . pantoprazole (PROTONIX) 40 MG tablet Take 1 tablet (40 mg total) by mouth 2 (two) times daily.  . traMADol (ULTRAM) 50 MG tablet Take 1 tablet (50 mg total) by mouth 2 (two) times daily as needed.  . vitamin E 400 UNIT capsule Take 400 Units by mouth daily.  Marland Kitchen azithromycin (ZITHROMAX) 500 MG tablet Take 1 tablet (500 mg total) by mouth daily. (Patient not taking: Reported on 01/04/2016)  . HYDROcodone-homatropine (HYCODAN) 5-1.5 MG/5ML syrup Take 5 mLs by mouth at bedtime as needed for cough. (Patient not taking: Reported on 02/06/2016)  . predniSONE (DELTASONE) 10 MG tablet Take 4 tablets x 1 day and then decrease by 1/2 tablet per day until down to zero mg.   No facility-administered encounter medications on file as of 02/06/2016.     Review of Systems  Constitutional: Negative for appetite change and unexpected weight change.  HENT: Positive for congestion and postnasal drip.   Respiratory: Negative for cough, chest tightness and shortness of breath.   Cardiovascular: Negative for chest pain, palpitations and leg swelling.  Gastrointestinal: Positive for diarrhea. Negative for abdominal pain, nausea and vomiting.  Musculoskeletal: Negative for back pain and neck stiffness.  Skin: Negative for color change and rash.  Neurological: Positive for dizziness and light-headedness. Negative for headaches.  Psychiatric/Behavioral: Negative for agitation and dysphoric mood.       Objective:    Physical Exam  Constitutional: He appears well-developed and well-nourished. No distress.  HENT:  Mouth/Throat: Oropharynx is clear and moist.  Nares - slightly erythematous turbinates.    Neck: Neck supple. No thyromegaly present.  Cardiovascular: Normal rate and regular rhythm.   Pulmonary/Chest: Effort normal and breath sounds normal. No respiratory distress.  Increased cough with forced  expiration.    Abdominal: Soft. Bowel sounds are normal. There is no tenderness.  Musculoskeletal: He exhibits no edema or tenderness.  Lymphadenopathy:    He has no cervical adenopathy.  Skin: No rash noted. No erythema.  Psychiatric: He has a normal mood and affect. His behavior is normal.    BP 118/84 (BP Location: Left Arm, Patient Position: Sitting, Cuff Size: Normal)   Pulse 75   Temp 98.3 F (36.8 C) (Oral)   Resp 16   Wt 189 lb 8 oz (86 kg)   SpO2 96%   BMI 28.81 kg/m  Wt Readings from Last 3 Encounters:  02/06/16 189 lb 8 oz (86 kg)  12/22/15 198 lb (89.8 kg)  12/05/15 192 lb 8 oz (87.3 kg)     Lab Results  Component Value Date   WBC 8.2 02/06/2016   HGB 12.7 (L) 02/06/2016   HCT 37.1 (  L) 02/06/2016   PLT 213.0 02/06/2016   GLUCOSE 102 (H) 02/06/2016   CHOL 162 08/04/2015   TRIG 181 (H) 12/03/2015   HDL 35.90 (L) 08/04/2015   LDLCALC 100 (H) 08/04/2015   ALT 19 02/06/2016   AST 14 02/06/2016   NA 140 02/06/2016   K 3.4 (L) 02/06/2016   CL 103 02/06/2016   CREATININE 0.97 02/06/2016   BUN 18 02/06/2016   CO2 28 02/06/2016   TSH 1.93 04/07/2015   PSA 1.50 04/07/2015   INR 1.15 12/03/2015   HGBA1C 6.2 08/04/2015   MICROALBUR 2.3 (H) 08/04/2015       Assessment & Plan:   Problem List Items Addressed This Visit    Cough - Primary    Persistent cough and congestion.  Previously treated with tamiflu.  Just completed abx and prednisone.  Increased cough with expiration.  Treat with prednisone taper as directed.  He is better.  Follow closely.        Diabetes mellitus ([place])    Low carb diet and exercise.  Follow sugars on prednisone.  Follow met b and a1c.        Dizziness    Dizziness as outlined.  Low blood pressures recently.  For some reason elevated earlier today - on his checks.  Will decrease hctz to 1/2 58m q day.  Follow pressures.  Stay hydrated.  Check carotid ultrasound.  EKG - SR with no acute ischemic changes.         Relevant Orders     EKG 12-Lead (Completed)   CBC with Differential/Platelet (Completed)   Hepatic function panel (Completed)   Basic metabolic panel (Completed)   VAS [place]CAROTID   GERD (gastroesophageal reflux disease)    On protonix.  Controlled.       Hypertension    Blood pressure under good control.  Continue same medication regimen.  Follow pressures.  Follow metabolic panel.        [place]    Followed by rheumatology.  Followed by KJefm Bryant  Stable.  Hold medication until infection clears.         Other Visit Diagnoses    Diarrhea, unspecified type       persistent.  probiotic daily.  check stool for c.diff.    Relevant Orders   Clostridium difficile EIA       SEinar Pheasant MD

## 2016-02-07 LAB — HEPATIC FUNCTION PANEL
ALBUMIN: 4.3 g/dL (ref 3.5–5.2)
ALT: 19 U/L (ref 0–53)
AST: 14 U/L (ref 0–37)
Alkaline Phosphatase: 38 U/L — ABNORMAL LOW (ref 39–117)
Bilirubin, Direct: 0.1 mg/dL (ref 0.0–0.3)
TOTAL PROTEIN: 7.4 g/dL (ref 6.0–8.3)
Total Bilirubin: 0.4 mg/dL (ref 0.2–1.2)

## 2016-02-07 LAB — CBC WITH DIFFERENTIAL/PLATELET
Basophils Absolute: 0 10*3/uL (ref 0.0–0.1)
Basophils Relative: 0.5 % (ref 0.0–3.0)
EOS PCT: 2.3 % (ref 0.0–5.0)
Eosinophils Absolute: 0.2 10*3/uL (ref 0.0–0.7)
HEMATOCRIT: 37.1 % — AB (ref 39.0–52.0)
Hemoglobin: 12.7 g/dL — ABNORMAL LOW (ref 13.0–17.0)
LYMPHS PCT: 22.2 % (ref 12.0–46.0)
Lymphs Abs: 1.8 10*3/uL (ref 0.7–4.0)
MCHC: 34.2 g/dL (ref 30.0–36.0)
MCV: 86.7 fl (ref 78.0–100.0)
MONOS PCT: 9.3 % (ref 3.0–12.0)
Monocytes Absolute: 0.8 10*3/uL (ref 0.1–1.0)
NEUTROS ABS: 5.4 10*3/uL (ref 1.4–7.7)
Neutrophils Relative %: 65.7 % (ref 43.0–77.0)
Platelets: 213 10*3/uL (ref 150.0–400.0)
RBC: 4.28 Mil/uL (ref 4.22–5.81)
RDW: 14.9 % (ref 11.5–15.5)
WBC: 8.2 10*3/uL (ref 4.0–10.5)

## 2016-02-07 LAB — BASIC METABOLIC PANEL
BUN: 18 mg/dL (ref 6–23)
CALCIUM: 9.4 mg/dL (ref 8.4–10.5)
CO2: 28 meq/L (ref 19–32)
CREATININE: 0.97 mg/dL (ref 0.40–1.50)
Chloride: 103 mEq/L (ref 96–112)
GFR: 81.96 mL/min (ref >60.00)
Glucose, Bld: 102 mg/dL — ABNORMAL HIGH (ref 70–99)
Potassium: 3.4 mEq/L — ABNORMAL LOW (ref 3.5–5.1)
SODIUM: 140 meq/L (ref 135–145)

## 2016-02-08 ENCOUNTER — Other Ambulatory Visit: Payer: Self-pay | Admitting: Internal Medicine

## 2016-02-08 DIAGNOSIS — E876 Hypokalemia: Secondary | ICD-10-CM

## 2016-02-08 NOTE — Progress Notes (Signed)
Order placed for f/u potassium check.  

## 2016-02-09 ENCOUNTER — Other Ambulatory Visit: Payer: BLUE CROSS/BLUE SHIELD

## 2016-02-09 ENCOUNTER — Other Ambulatory Visit: Payer: Self-pay | Admitting: Internal Medicine

## 2016-02-10 ENCOUNTER — Encounter: Payer: Self-pay | Admitting: Internal Medicine

## 2016-02-10 LAB — C. DIFFICILE GDH AND TOXIN A/B
C. DIFF TOXIN A/B: NOT DETECTED
C. difficile GDH: NOT DETECTED

## 2016-02-11 ENCOUNTER — Encounter: Payer: Self-pay | Admitting: Internal Medicine

## 2016-02-11 DIAGNOSIS — R05 Cough: Secondary | ICD-10-CM | POA: Insufficient documentation

## 2016-02-11 DIAGNOSIS — R059 Cough, unspecified: Secondary | ICD-10-CM | POA: Insufficient documentation

## 2016-02-11 DIAGNOSIS — R42 Dizziness and giddiness: Secondary | ICD-10-CM | POA: Insufficient documentation

## 2016-02-11 NOTE — Assessment & Plan Note (Signed)
Blood pressure under good control.  Continue same medication regimen.  Follow pressures.  Follow metabolic panel.   

## 2016-02-11 NOTE — Assessment & Plan Note (Addendum)
Dizziness as outlined.  Low blood pressures recently.  For some reason elevated earlier today - on his checks.  Will decrease hctz to 1/2 25mg  q day.  Follow pressures.  Stay hydrated.  Check carotid ultrasound.  EKG - SR with no acute ischemic changes.

## 2016-02-11 NOTE — Assessment & Plan Note (Signed)
Low carb diet and exercise.  Follow sugars on prednisone.  Follow met b and a1c.

## 2016-02-11 NOTE — Assessment & Plan Note (Signed)
On protonix.  Controlled.   

## 2016-02-11 NOTE — Assessment & Plan Note (Signed)
Persistent cough and congestion.  Previously treated with tamiflu.  Just completed abx and prednisone.  Increased cough with expiration.  Treat with prednisone taper as directed.  He is better.  Follow closely.

## 2016-02-11 NOTE — Assessment & Plan Note (Signed)
Followed by rheumatology.  Followed by Jefm Bryant.  Stable.  Hold medication until infection clears.

## 2016-02-18 ENCOUNTER — Other Ambulatory Visit: Payer: Self-pay | Admitting: Internal Medicine

## 2016-02-20 ENCOUNTER — Encounter: Payer: Self-pay | Admitting: Internal Medicine

## 2016-02-20 ENCOUNTER — Ambulatory Visit (INDEPENDENT_AMBULATORY_CARE_PROVIDER_SITE_OTHER): Payer: BLUE CROSS/BLUE SHIELD | Admitting: Internal Medicine

## 2016-02-20 VITALS — BP 120/76 | HR 88 | Temp 98.6°F | Ht 68.0 in | Wt 193.2 lb

## 2016-02-20 DIAGNOSIS — I10 Essential (primary) hypertension: Secondary | ICD-10-CM | POA: Diagnosis not present

## 2016-02-20 DIAGNOSIS — K625 Hemorrhage of anus and rectum: Secondary | ICD-10-CM

## 2016-02-20 DIAGNOSIS — E876 Hypokalemia: Secondary | ICD-10-CM

## 2016-02-20 DIAGNOSIS — R945 Abnormal results of liver function studies: Secondary | ICD-10-CM

## 2016-02-20 DIAGNOSIS — K219 Gastro-esophageal reflux disease without esophagitis: Secondary | ICD-10-CM | POA: Diagnosis not present

## 2016-02-20 DIAGNOSIS — L4050 Arthropathic psoriasis, unspecified: Secondary | ICD-10-CM | POA: Diagnosis not present

## 2016-02-20 DIAGNOSIS — R7989 Other specified abnormal findings of blood chemistry: Secondary | ICD-10-CM

## 2016-02-20 DIAGNOSIS — E119 Type 2 diabetes mellitus without complications: Secondary | ICD-10-CM

## 2016-02-20 DIAGNOSIS — R972 Elevated prostate specific antigen [PSA]: Secondary | ICD-10-CM | POA: Diagnosis not present

## 2016-02-20 DIAGNOSIS — Z0000 Encounter for general adult medical examination without abnormal findings: Secondary | ICD-10-CM

## 2016-02-20 NOTE — Progress Notes (Signed)
Patient ID: Shane Mcknight, male   DOB: May 06, 1950, 66 y.o.   MRN: [id number]   Subjective:    Patient ID: Shane Mcknight, male    DOB: 29-May-1950, 66 y.o.   MRN: [id number]  HPI  Patient here for his physical exam.  Last visit, he was having issues with light headedness and diarrhea.  See last note for details.  I adjusted his hctz to 1/2 tablet.  He is back on a whole tablet now.  Light headedness is better.  Diarrhea is better.  Did notice some red blood in stool this am.  Water was pink.  No acid reflux.  No abdominal pain.  Had another bowel movement today - no blood.  No chest pain.  Breathing stable.  Feet are hurting.  Has been off his medication (cosentyx) secondary to being sick.     Past Medical History:  Diagnosis Date  . Abnormal liver function tests   . Allergic state   . Arthritis    spine, hands, shoulder with previous cuff tear  . Colon polyp   . Diabetes mellitus ([place])    type 2 since 09/16/2011  . Elevated transaminase level   . GERD (gastroesophageal reflux disease)   . Hiatal hernia   . Hypercholesterolemia   . Hypertension   . Psoriasis   . Psoriatic arthritis ([place])   . Sleep apnea    Past Surgical History:  Procedure Laterality Date  . ARTHROPLASTY     right thumb; left thumb  . BALLOON DILATION N/A 05/29/2015   Procedure: BALLOON DILATION;  Surgeon: Lollie Sails, MD;  Location: [facility] ENDOSCOPY;  Service: Endoscopy;  Laterality: N/A;  . BLEPHAROPLASTY    . CARPAL TUNNEL RELEASE     right  . CHOLECYSTECTOMY    . COLONOSCOPY    . CYST EXCISION  04/14/2015   tendon sheath cyst excision; right ring finger cyst removed and trigger finger realease  . ESOPHAGOGASTRODUODENOSCOPY    . ESOPHAGOGASTRODUODENOSCOPY (EGD) WITH PROPOFOL N/A 05/29/2015   Procedure: ESOPHAGOGASTRODUODENOSCOPY (EGD) WITH PROPOFOL;  Surgeon: Lollie Sails, MD;  Location: [facility] ENDOSCOPY;  Service: Endoscopy;  Laterality: N/A;  . ESOPHAGOGASTRODUODENOSCOPY (EGD) WITH PROPOFOL N/A 07/04/2015     Procedure: ESOPHAGOGASTRODUODENOSCOPY (EGD) WITH PROPOFOL;  Surgeon: Lollie Sails, MD;  Location: [facility] ENDOSCOPY;  Service: Endoscopy;  Laterality: N/A;  . ESOPHAGOGASTRODUODENOSCOPY (EGD) WITH PROPOFOL N/A 01/04/2016   Procedure: ESOPHAGOGASTRODUODENOSCOPY (EGD) WITH PROPOFOL;  Surgeon: Lollie Sails, MD;  Location: [facility] ENDOSCOPY;  Service: Endoscopy;  Laterality: N/A;  . FOREIGN BODY REMOVAL N/A 12/03/2015   Procedure: FOREIGN BODY REMOVAL;  Surgeon: Wilford Corner, MD;  Location: [facility] ENDOSCOPY;  Service: Endoscopy;  Laterality: N/A;  . HERNIA REPAIR    . LUMBAR DISC SURGERY    . NASAL SINUS SURGERY    . ROTATOR CUFF REPAIR    . TONSILLECTOMY     Family History  Problem Relation Age of Onset  . Lung cancer Father   . Hypertension Mother    Social History   Social History  . Marital status: Married    Spouse name: N/A  . Number of children: N/A  . Years of education: N/A   Social History Main Topics  . Smoking status: Former Smoker    Quit date: 07/06/1982  . Smokeless tobacco: Never Used  . Alcohol use No  . Drug use: No  . Sexual activity: Not Asked   Other Topics Concern  . None   Social History Narrative  . None  Outpatient Encounter Prescriptions as of 02/20/2016  Medication Sig  . atenolol (TENORMIN) 50 MG tablet Take 1 tablet (50 mg total) by mouth 2 (two) times daily.  Hillary Bow SENSOREADY PEN 150 MG/ML SOAJ Inject 1 Dose into the skin every 30 (thirty) days. Due 81/85/6314  . folic acid (FOLVITE) 1 MG tablet Take 1 mg by mouth daily.  Marland Kitchen gabapentin (NEURONTIN) 300 MG capsule Take 300 mg by mouth 3 (three) times daily as needed.  . hydrochlorothiazide (HYDRODIURIL) 25 MG tablet TAKE 1 TABLET BY MOUTH EVERY DAY  . losartan (COZAAR) 50 MG tablet TAKE 1 TABLET BY MOUTH EVERY DAY  . metFORMIN (GLUCOPHAGE) 500 MG tablet TAKE 4 TABLETS BY MOUTH EVERY EVENING. (Patient taking differently: TAKE 2 TABLETS TWO TIMES DAILY)  . Multiple Vitamin  (MULTIVITAMIN) tablet Take 1 tablet by mouth daily.  . pantoprazole (PROTONIX) 40 MG tablet Take 1 tablet (40 mg total) by mouth 2 (two) times daily.  . predniSONE (DELTASONE) 10 MG tablet Take 4 tablets x 1 day and then decrease by 1/2 tablet per day until down to zero mg.  . traMADol (ULTRAM) 50 MG tablet Take 1 tablet (50 mg total) by mouth 2 (two) times daily as needed.  . vitamin E 400 UNIT capsule Take 400 Units by mouth daily.  . [DISCONTINUED] azithromycin (ZITHROMAX) 500 MG tablet Take 1 tablet (500 mg total) by mouth daily. (Patient not taking: Reported on 02/20/2016)  . [DISCONTINUED] HYDROcodone-homatropine (HYCODAN) 5-1.5 MG/5ML syrup Take 5 mLs by mouth at bedtime as needed for cough. (Patient not taking: Reported on 02/20/2016)   No facility-administered encounter medications on file as of 02/20/2016.     Review of Systems  Constitutional: Negative for fever and unexpected weight change.       Eating.    HENT: Negative for congestion and sinus pressure.   Eyes: Negative for pain and visual disturbance.  Respiratory: Negative for cough, chest tightness and shortness of breath.   Cardiovascular: Negative for chest pain, palpitations and leg swelling.  Gastrointestinal: Negative for abdominal pain, diarrhea, nausea and vomiting.  Genitourinary: Negative for difficulty urinating and dysuria.  Musculoskeletal: Negative for myalgias.       Feet hurting.    Skin: Negative for color change and rash.  Neurological: Negative for headaches.       Light headedness better.    Hematological: Negative for adenopathy. Does not bruise/bleed easily.  Psychiatric/Behavioral: Negative for agitation and dysphoric mood.       Objective:    Physical Exam  Constitutional: He is oriented to person, place, and time. He appears well-developed and well-nourished. No distress.  HENT:  Head: Normocephalic and atraumatic.  Nose: Nose normal.  Mouth/Throat: Oropharynx is clear and moist. No  oropharyngeal exudate.  Eyes: Conjunctivae are normal. Right eye exhibits no discharge. Left eye exhibits no discharge.  Neck: Neck supple. No thyromegaly present.  Cardiovascular: Normal rate and regular rhythm.   Pulmonary/Chest: Breath sounds normal. No respiratory distress. He has no wheezes.  Abdominal: Soft. Bowel sounds are normal. There is no tenderness.  Genitourinary:  Genitourinary Comments: Rectal exam - heme negative.  No palpable prostate nodules.    Musculoskeletal: He exhibits no edema or tenderness.  Lymphadenopathy:    He has no cervical adenopathy.  Neurological: He is alert and oriented to person, place, and time.  Skin: Skin is warm and dry. No rash noted. No erythema.  Psychiatric: He has a normal mood and affect. His behavior is normal.    BP  120/76 (BP Location: Left Arm, Patient Position: Sitting, Cuff Size: Large)   Pulse 88   Temp 98.6 F (37 C) (Oral)   Ht _0  (1.727 m)   Wt 193 lb 3.2 oz (87.6 kg)   SpO2 97%   BMI 29.38 kg/m  Wt Readings from Last 3 Encounters:  02/20/16 193 lb 3.2 oz (87.6 kg)  02/06/16 189 lb 8 oz (86 kg)  12/22/15 198 lb (89.8 kg)     Lab Results  Component Value Date   WBC 8.2 02/06/2016   HGB 12.7 (L) 02/06/2016   HCT 37.1 (L) 02/06/2016   PLT 213.0 02/06/2016   GLUCOSE 102 (H) 02/06/2016   CHOL 162 08/04/2015   TRIG 181 (H) 12/03/2015   HDL 35.90 (L) 08/04/2015   LDLCALC 100 (H) 08/04/2015   ALT 19 02/06/2016   AST 14 02/06/2016   NA 140 02/06/2016   K 3.6 02/20/2016   CL 103 02/06/2016   CREATININE 0.97 02/06/2016   BUN 18 02/06/2016   CO2 28 02/06/2016   TSH 1.93 04/07/2015   PSA 1.50 04/07/2015   INR 1.15 12/03/2015   HGBA1C 6.2 08/04/2015   MICROALBUR 2.3 (H) 08/04/2015       Assessment & Plan:   Problem List Items Addressed This Visit    Abnormal liver function tests    Follow liver panel.        Diabetes mellitus ([place])    Low carb diet and exercise.  Follow met b and a1c.        Elevated  PSA measurement    04/07/15 - psa 1.50.      GERD (gastroesophageal reflux disease)    Controlled on protonix.        Health care maintenance    Physical today 02/20/16.        Hypertension    Blood pressure appears to be doing better now.  Back on hctz 19m q day.  Follow.  Continues on losartan.        Psoriatic arthritis ([place]    Followed by rheumatology.  Of cosentyx because of current issues/infection.  Feet hurting.  Continue to f/u with Dr KJefm Bryant       Rectal bleeding    Bleeding this am.  Previous diarrhea.  Better.  Recent c.diff negative.  Has appt with GI this week.  Plans to discuss the bleeding.  No further bleeding. Heme negative on exam today.        Other Visit Diagnoses    Hypokalemia           SEinar Pheasant MD

## 2016-02-20 NOTE — Assessment & Plan Note (Signed)
Physical today 02/20/16.

## 2016-02-20 NOTE — Progress Notes (Signed)
Pre-visit discussion using our clinic review tool. No additional management support is needed unless otherwise documented below in the visit note.  

## 2016-02-21 LAB — POTASSIUM: POTASSIUM: 3.6 meq/L (ref 3.5–5.1)

## 2016-02-26 ENCOUNTER — Encounter: Payer: Self-pay | Admitting: Internal Medicine

## 2016-02-26 NOTE — Assessment & Plan Note (Signed)
Controlled on protonix.   

## 2016-02-26 NOTE — Assessment & Plan Note (Signed)
Followed by rheumatology.  Of cosentyx because of current issues/infection.  Feet hurting.  Continue to f/u with Dr Jefm Bryant.

## 2016-02-26 NOTE — Assessment & Plan Note (Signed)
Low carb diet and exercise.  Follow met b and a1c.   

## 2016-02-26 NOTE — Assessment & Plan Note (Signed)
Bleeding this am.  Previous diarrhea.  Better.  Recent c.diff negative.  Has appt with GI this week.  Plans to discuss the bleeding.  No further bleeding. Heme negative on exam today.

## 2016-02-26 NOTE — Assessment & Plan Note (Signed)
04/07/15 - psa 1.50.

## 2016-02-26 NOTE — Assessment & Plan Note (Signed)
Blood pressure appears to be doing better now.  Back on hctz 25mg  q day.  Follow.  Continues on losartan.

## 2016-02-26 NOTE — Assessment & Plan Note (Signed)
Follow liver panel.  

## 2016-03-05 ENCOUNTER — Other Ambulatory Visit: Payer: Self-pay | Admitting: Internal Medicine

## 2016-03-05 ENCOUNTER — Ambulatory Visit: Payer: BLUE CROSS/BLUE SHIELD

## 2016-03-05 DIAGNOSIS — R42 Dizziness and giddiness: Secondary | ICD-10-CM

## 2016-03-05 DIAGNOSIS — R0989 Other specified symptoms and signs involving the circulatory and respiratory systems: Secondary | ICD-10-CM

## 2016-03-06 LAB — VAS US CAROTID
LCCADDIAS: 27 cm/s
LCCADSYS: 105 cm/s
LCCAPSYS: 77 cm/s
LEFT ECA DIAS: -19 cm/s
LEFT VERTEBRAL DIAS: -11 cm/s
LICADDIAS: -23 cm/s
LICADSYS: -67 cm/s
LICAPSYS: -149 cm/s
Left CCA prox dias: 15 cm/s
Left ICA prox dias: -30 cm/s
RIGHT ECA DIAS: 10 cm/s
RIGHT VERTEBRAL DIAS: 8 cm/s
Right CCA prox dias: 29 cm/s
Right CCA prox sys: 134 cm/s
Right cca dist sys: -103 cm/s

## 2016-03-21 ENCOUNTER — Encounter: Payer: Self-pay | Admitting: *Deleted

## 2016-03-22 ENCOUNTER — Ambulatory Visit
Admission: RE | Admit: 2016-03-22 | Discharge: 2016-03-22 | Disposition: A | Discharge status: Home / Self Care | Payer: BLUE CROSS/BLUE SHIELD | Source: Ambulatory Visit | Attending: Gastroenterology | Admitting: Gastroenterology

## 2016-03-22 ENCOUNTER — Encounter
Admission: RE | Disposition: A | Discharge status: Home / Self Care | Payer: Self-pay | Source: Ambulatory Visit | Attending: Gastroenterology

## 2016-03-22 ENCOUNTER — Ambulatory Visit: Payer: BLUE CROSS/BLUE SHIELD | Admitting: Anesthesiology

## 2016-03-22 ENCOUNTER — Encounter: Payer: Self-pay | Admitting: Anesthesiology

## 2016-03-22 DIAGNOSIS — L4050 Arthropathic psoriasis, unspecified: Secondary | ICD-10-CM | POA: Diagnosis not present

## 2016-03-22 DIAGNOSIS — M19042 Primary osteoarthritis, left hand: Secondary | ICD-10-CM | POA: Diagnosis not present

## 2016-03-22 DIAGNOSIS — K219 Gastro-esophageal reflux disease without esophagitis: Secondary | ICD-10-CM | POA: Insufficient documentation

## 2016-03-22 DIAGNOSIS — G4730 Sleep apnea, unspecified: Secondary | ICD-10-CM | POA: Diagnosis not present

## 2016-03-22 DIAGNOSIS — Z801 Family history of malignant neoplasm of trachea, bronchus and lung: Secondary | ICD-10-CM | POA: Insufficient documentation

## 2016-03-22 DIAGNOSIS — M479 Spondylosis, unspecified: Secondary | ICD-10-CM | POA: Insufficient documentation

## 2016-03-22 DIAGNOSIS — K224 Dyskinesia of esophagus: Secondary | ICD-10-CM | POA: Diagnosis not present

## 2016-03-22 DIAGNOSIS — Z9889 Other specified postprocedural states: Secondary | ICD-10-CM | POA: Diagnosis not present

## 2016-03-22 DIAGNOSIS — K222 Esophageal obstruction: Secondary | ICD-10-CM | POA: Diagnosis not present

## 2016-03-22 DIAGNOSIS — M19041 Primary osteoarthritis, right hand: Secondary | ICD-10-CM | POA: Insufficient documentation

## 2016-03-22 DIAGNOSIS — E7800 Pure hypercholesterolemia, unspecified: Secondary | ICD-10-CM | POA: Diagnosis not present

## 2016-03-22 DIAGNOSIS — Z881 Allergy status to other antibiotic agents status: Secondary | ICD-10-CM | POA: Insufficient documentation

## 2016-03-22 DIAGNOSIS — Z7982 Long term (current) use of aspirin: Secondary | ICD-10-CM | POA: Diagnosis not present

## 2016-03-22 DIAGNOSIS — Z79899 Other long term (current) drug therapy: Secondary | ICD-10-CM | POA: Diagnosis not present

## 2016-03-22 DIAGNOSIS — K449 Diaphragmatic hernia without obstruction or gangrene: Secondary | ICD-10-CM | POA: Insufficient documentation

## 2016-03-22 DIAGNOSIS — Z91013 Allergy to seafood: Secondary | ICD-10-CM | POA: Insufficient documentation

## 2016-03-22 DIAGNOSIS — Z880 Allergy status to penicillin: Secondary | ICD-10-CM | POA: Insufficient documentation

## 2016-03-22 DIAGNOSIS — E119 Type 2 diabetes mellitus without complications: Secondary | ICD-10-CM | POA: Diagnosis not present

## 2016-03-22 DIAGNOSIS — Z87891 Personal history of nicotine dependence: Secondary | ICD-10-CM | POA: Diagnosis not present

## 2016-03-22 DIAGNOSIS — Z7984 Long term (current) use of oral hypoglycemic drugs: Secondary | ICD-10-CM | POA: Diagnosis not present

## 2016-03-22 DIAGNOSIS — Z86010 Personal history of colonic polyps: Secondary | ICD-10-CM | POA: Insufficient documentation

## 2016-03-22 DIAGNOSIS — R1310 Dysphagia, unspecified: Secondary | ICD-10-CM | POA: Diagnosis present

## 2016-03-22 DIAGNOSIS — Z885 Allergy status to narcotic agent status: Secondary | ICD-10-CM | POA: Insufficient documentation

## 2016-03-22 DIAGNOSIS — M19019 Primary osteoarthritis, unspecified shoulder: Secondary | ICD-10-CM | POA: Diagnosis not present

## 2016-03-22 DIAGNOSIS — I10 Essential (primary) hypertension: Secondary | ICD-10-CM | POA: Diagnosis not present

## 2016-03-22 DIAGNOSIS — Z8249 Family history of ischemic heart disease and other diseases of the circulatory system: Secondary | ICD-10-CM | POA: Insufficient documentation

## 2016-03-22 HISTORY — DX: Radiculopathy, lumbar region: M54.16

## 2016-03-22 HISTORY — DX: Other intervertebral disc displacement, lumbar region: M51.26

## 2016-03-22 HISTORY — DX: Other intervertebral disc degeneration, lumbosacral region: M51.37

## 2016-03-22 HISTORY — DX: Other intervertebral disc degeneration, lumbosacral region without mention of lumbar back pain or lower extremity pain: M51.379

## 2016-03-22 HISTORY — DX: Other fecal abnormalities: R19.5

## 2016-03-22 HISTORY — DX: Migraine with aura, not intractable, without status migrainosus: G43.109

## 2016-03-22 HISTORY — DX: Ganglion, right hand: M67.441

## 2016-03-22 HISTORY — PX: ESOPHAGOGASTRODUODENOSCOPY (EGD) WITH PROPOFOL: SHX5813

## 2016-03-22 LAB — GLUCOSE, CAPILLARY: GLUCOSE-CAPILLARY: 124 mg/dL — AB (ref 65–99)

## 2016-03-22 SURGERY — ESOPHAGOGASTRODUODENOSCOPY (EGD) WITH PROPOFOL
Anesthesia: General

## 2016-03-22 MED ORDER — SODIUM CHLORIDE 0.9 % IV SOLN: INTRAVENOUS | Status: DC

## 2016-03-22 MED ORDER — FENTANYL CITRATE (PF) 100 MCG/2ML IJ SOLN: 25.0000 ug | INTRAMUSCULAR | Status: DC | PRN

## 2016-03-22 MED ORDER — PHENYLEPHRINE HCL 10 MG/ML IJ SOLN
INTRAMUSCULAR | Status: DC | PRN
  Administered 2016-03-22 (×3): 100 ug via INTRAVENOUS

## 2016-03-22 MED ORDER — PROPOFOL 500 MG/50ML IV EMUL
INTRAVENOUS | Status: AC
Start: 2016-03-22 — End: 2016-03-22
  Filled 2016-03-22: qty 50

## 2016-03-22 MED ORDER — PROPOFOL 10 MG/ML IV BOLUS
INTRAVENOUS | Status: DC | PRN
Start: 2016-03-22 — End: 2016-03-22
  Administered 2016-03-22 (×2): 50 mg via INTRAVENOUS
  Administered 2016-03-22: 20 mg via INTRAVENOUS
  Administered 2016-03-22: 50 mg via INTRAVENOUS

## 2016-03-22 MED ORDER — PROPOFOL 10 MG/ML IV BOLUS
INTRAVENOUS | Status: AC
  Filled 2016-03-22: qty 20

## 2016-03-22 MED ORDER — LIDOCAINE HCL (PF) 2 % IJ SOLN
INTRAMUSCULAR | Status: AC
  Filled 2016-03-22: qty 2

## 2016-03-22 MED ORDER — ONDANSETRON HCL 4 MG/2ML IJ SOLN: 4.0000 mg | Freq: Once | INTRAMUSCULAR | Status: DC | PRN

## 2016-03-22 MED ORDER — DEXMEDETOMIDINE HCL 200 MCG/2ML IV SOLN
INTRAVENOUS | Status: DC | PRN
  Administered 2016-03-22: 4 ug via INTRAVENOUS

## 2016-03-22 MED ORDER — SODIUM CHLORIDE 0.9 % IV SOLN
INTRAVENOUS | Status: DC
  Administered 2016-03-22: 1000 mL via INTRAVENOUS
  Administered 2016-03-22: 12:00:00 via INTRAVENOUS

## 2016-03-22 MED ORDER — GLYCOPYRROLATE 0.2 MG/ML IJ SOLN
INTRAMUSCULAR | Status: DC | PRN
  Administered 2016-03-22: 0.2 mg via INTRAVENOUS

## 2016-03-22 NOTE — Anesthesia Preprocedure Evaluation (Signed)
Anesthesia Evaluation  Patient identified by MRN, date of birth, ID band Patient awake    Reviewed: Allergy & Precautions, NPO status , Patient's Chart, lab work & pertinent test results, reviewed documented beta blocker date and time   History of Anesthesia Complications Negative for: history of anesthetic complications  Airway Mallampati: II  TM Distance: >3 FB Neck ROM: Full    Dental  (+) Lower Dentures, Upper Dentures   Pulmonary sleep apnea (does not use CPAP, has lost significant amount of weight since initially diagnosed) , neg COPD, former smoker,    breath sounds clear to auscultation- rhonchi (-) wheezing      Cardiovascular Exercise Tolerance: Good hypertension, Pt. on medications and Pt. on home beta blockers (-) CAD and (-) Past MI  Rhythm:Regular Rate:Normal - Systolic murmurs and - Diastolic murmurs    Neuro/Psych  Headaches,  Neuromuscular disease negative psych ROS   GI/Hepatic Neg liver ROS, hiatal hernia, GERD  ,  Endo/Other  diabetes, Type 2, Oral Hypoglycemic Agents  Renal/GU negative Renal ROS  negative genitourinary   Musculoskeletal  (+) Arthritis ,   Abdominal (+) + obese,   Peds negative pediatric ROS (+)  Hematology  (+) anemia ,   Anesthesia Other Findings Past Medical History: No date: Abnormal liver function tests No date: Allergic state No date: Arthritis     Comment: spine, hands, shoulder with previous cuff tear No date: Colon polyp No date: Diabetes mellitus ([place])     Comment: type 2 since 09/16/2011 No date: Elevated transaminase level No date: GERD (gastroesophageal reflux disease) No date: Hiatal hernia No date: Hypercholesterolemia No date: Hypertension No date: Psoriasis No date: Psoriatic arthritis ([place]) No date: Sleep apnea   Reproductive/Obstetrics                             Anesthesia Physical  Anesthesia Plan  ASA: III  Anesthesia  Plan: General   Post-op Pain Management:    Induction: Intravenous  Airway Management Planned: Nasal Cannula  Additional Equipment:   Intra-op Plan:   Post-operative Plan:   Informed Consent: I have reviewed the patients History and Physical, chart, labs and discussed the procedure including the risks, benefits and alternatives for the proposed anesthesia with the patient or authorized representative who has indicated his/her understanding and acceptance.   Dental advisory given  Plan Discussed with: CRNA and Anesthesiologist  Anesthesia Plan Comments:         Anesthesia Quick Evaluation

## 2016-03-22 NOTE — Op Note (Signed)
[facility] Patient Name: Shane Mcknight Procedure Date: 03/22/2016 11:43 AM MRN: [id number] Account #: [id number] Date of Birth: 07-28-1950 Admit Type: Outpatient Age: 66 Room: [facility] ENDO ROOM 1 Gender: Male Note Status: Finalized Procedure:            Upper GI endoscopy Indications:          Dysphagia Providers:            Lollie Sails, MD Referring MD:         Einar Pheasant, MD (Referring MD) Medicines:            Monitored Anesthesia Care Complications:        No immediate complications. Procedure:            Pre-Anesthesia Assessment:                       - ASA Grade Assessment: III - A patient with severe                        systemic disease.                       After obtaining informed consent, the endoscope was                        passed under direct vision. Throughout the procedure,                        the patient's blood pressure, pulse, and oxygen                        saturations were monitored continuously. The Endoscope                        was introduced through the mouth, and advanced to the                        third part of duodenum. The upper GI endoscopy was                        accomplished without difficulty. The patient tolerated                        the procedure well. Findings:      Abnormal motility was noted in the middle third of the esophagus and in       the lower third of the esophagus. The cricopharyngeus was normal. There       is spasticity and extra peristaltic waves of the esophageal body.       Tertiary peristaltic waves are noted.      A low-grade of narrowing Schatzki ring (acquired) was found at the       gastroesophageal junction. A TTS dilator was passed through the scope.       Dilation with a 11-26-11.5-15 mm balloon dilator was performed to 13 mm       with opening of the ring noted.      A small sliding hiatal hernia was found.      The cardia and gastric fundus were normal on  retroflexion otherwise.      The examined duodenum was normal. Impression:           -  Abnormal esophageal motility, suspicious for                        presbyesophagus.                       - Low-grade of narrowing Schatzki ring. Dilated.                       - No specimens collected. Recommendation:       - Clear liquid diet today.                       - Full liquid diet for 2 days, then advance as                        tolerated to soft diet for 3 days.                       - Continue present medications. Procedure Code(s):    --- Professional ---                       [phone], Esophagogastroduodenoscopy, flexible, transoral;                        with transendoscopic balloon dilation of esophagus                        (less than 30 mm diameter) Diagnosis Code(s):    --- Professional ---                       K22.4, Dyskinesia of esophagus                       K22.2, Esophageal obstruction                       R13.10, Dysphagia, unspecified CPT copyright 2016 American Medical Association. All rights reserved. The codes documented in this report are preliminary and upon coder review may  be revised to meet current compliance requirements. Lollie Sails, MD 03/22/2016 12:40:19 PM This report has been signed electronically. Number of Addenda: 0 Note Initiated On: 03/22/2016 11:43 AM      [facility]

## 2016-03-22 NOTE — Anesthesia Postprocedure Evaluation (Signed)
Anesthesia Post Note  Patient: Shane Mcknight  Procedure(s) Performed: Procedure(s) (LRB): ESOPHAGOGASTRODUODENOSCOPY (EGD) WITH PROPOFOL (N/A)  Patient location during evaluation: PACU Anesthesia Type: General Level of consciousness: awake and alert and oriented Pain management: pain level controlled Vital Signs Assessment: post-procedure vital signs reviewed and stable Respiratory status: spontaneous breathing Cardiovascular status: blood pressure returned to baseline Anesthetic complications: no     Last Vitals:  Vitals:   03/22/16 1257 03/22/16 1307  BP: (!) 86/65 103/63  Pulse: 76 76  Resp: 16 19  Temp:      Last Pain:  Vitals:   03/22/16 1237  TempSrc: Tympanic                 [name]

## 2016-03-22 NOTE — H&P (Signed)
Outpatient short stay form Pre-procedure 03/22/2016 11:54 AM Lollie Sails MD  Primary Physician: Dr. Einar Pheasant  Reason for visit:  EGD  History of present illness:  Patient is a 66 year old male presenting today as above. He has a history of chronic recurrent dysphagia which has been dilated several times. Several months ago he had an episode of emesis which caused a fairly severe Mallory-Weiss tear. He was last evaluated with EGD 01/04/2016 about a month or so after that episode. Seem to be a fairly deep residual defect at the GE junction at the time. Redilate him at that time. He is presenting today after recheck this area and to consider dilatation. He has held his 81 mg aspirin for about 5 days. He takes no other aspirin or blood thinning agents.    Current Facility-Administered Medications:  .  0.9 %  sodium chloride infusion, , Intravenous, Continuous, Lollie Sails, MD, Last Rate: 20 mL/hr at 03/22/16 1058 .  0.9 %  sodium chloride infusion, , Intravenous, Continuous, Lollie Sails, MD .  fentaNYL (SUBLIMAZE) injection 25 mcg, 25 mcg, Intravenous, Q5 min PRN, Alvin Critchley, MD .  ondansetron [facility]) injection 4 mg, 4 mg, Intravenous, Once PRN, Alvin Critchley, MD  Facility-Administered Medications Ordered in Other Encounters:  .  dexmedetomidine (PRECEDEX) injection, , , Anesthesia Intra-op, Marsh Dolly, CRNA, 4 mcg at 03/22/16 1152 .  glycopyrrolate (ROBINUL) injection, , Intravenous, Anesthesia Intra-op, Marsh Dolly, CRNA, 0.2 mg at 03/22/16 1152  Prescriptions Prior to Admission  Medication Sig Dispense Refill Last Dose  . aspirin EC 81 MG tablet Take 81 mg by mouth daily.   Past Week at Unknown time  . atenolol (TENORMIN) 50 MG tablet Take 1 tablet (50 mg total) by mouth 2 (two) times daily. 60 tablet 7 03/22/2016 at 0330  . COSENTYX SENSOREADY PEN 150 MG/ML SOAJ Inject 1 Dose into the skin every 30 (thirty) days. Due 12/16/2015  10 Past Month at Unknown time  .  folic acid (FOLVITE) 1 MG tablet Take 1 mg by mouth daily.   Past Month at Unknown time  . gabapentin (NEURONTIN) 300 MG capsule Take 300 mg by mouth 3 (three) times daily as needed.   Past Month at Unknown time  . hydrochlorothiazide (HYDRODIURIL) 25 MG tablet TAKE 1 TABLET BY MOUTH EVERY DAY 30 tablet 11 03/22/2016 at 0330  . losartan (COZAAR) 50 MG tablet TAKE 1 TABLET BY MOUTH EVERY DAY 90 tablet 0 03/22/2016 at 0330  . metFORMIN (GLUCOPHAGE) 500 MG tablet TAKE 4 TABLETS BY MOUTH EVERY EVENING. (Patient taking differently: TAKE 2 TABLETS TWO TIMES DAILY) 120 tablet 11 03/21/2016 at Unknown time  . Multiple Vitamin (MULTIVITAMIN) tablet Take 1 tablet by mouth daily.   Past Week at Unknown time  . pantoprazole (PROTONIX) 40 MG tablet Take 1 tablet (40 mg total) by mouth 2 (two) times daily. 60 tablet 0 03/22/2016 at 0330  . saccharomyces boulardii (FLORASTOR) 250 MG capsule Take 250 mg by mouth 2 (two) times daily.   Past Week at Unknown time  . traMADol (ULTRAM) 50 MG tablet Take 1 tablet (50 mg total) by mouth 2 (two) times daily as needed. 40 tablet 0 Past Month at Unknown time  . vitamin E 400 UNIT capsule Take 400 Units by mouth daily.   Past Week at Unknown time  . predniSONE (DELTASONE) 10 MG tablet Take 4 tablets x 1 day and then decrease by 1/2 tablet per day until down to zero mg. (Patient not taking:  Reported on 03/22/2016) 17 tablet 0 Not Taking at Unknown time     Allergies  Allergen Reactions  . Amoxicillin   . Ephedrine Other (See Comments)    pain  . Levsin [Hyoscyamine Sulfate] Other (See Comments)    Urinary retention  . Naprosyn [Naproxen] Other (See Comments)    GI upset   . Penicillins   . Sudafed [Pseudoephedrine Hcl]   . Vancomycin   . Vicodin [Hydrocodone-Acetaminophen]     Other reaction(s): Other (See Comments) GI Upset  . Other Rash    Paper tape  . Shellfish Allergy Rash     Past Medical History:  Diagnosis Date  . Abnormal liver function tests   . Allergic  state   . Arthritis    spine, hands, shoulder with previous cuff tear  . Colon polyp   . DDD (degenerative disc disease), lumbosacral   . Diabetes mellitus ([place])    type 2 since 09/16/2011  . Elevated transaminase level   . Ganglion cyst of finger of right hand   . GERD (gastroesophageal reflux disease)   . Guaiac positive stools   . Hiatal hernia   . HNP (herniated nucleus pulposus), lumbar   . Hypercholesterolemia   . Hypertension   . Lumbar radiculitis   . Migraine aura without headache   . Psoriasis   . Psoriatic arthritis ([place])   . Sleep apnea     Review of systems:      Physical Exam    Heart and lungs: Regular rate and rhythm without rub or gallop, lungs are bilaterally clear.    HEENT: Normocephalic atraumatic eyes are anicteric    Other:     Pertinant exam for procedure: Soft nontender nondistended bowel sounds positive normoactive.    Planned proceedures: EGD and indicated procedures.    Lollie Sails, MD Gastroenterology 03/22/2016  11:54 AM

## 2016-03-22 NOTE — Anesthesia Post-op Follow-up Note (Cosign Needed)
Anesthesia QCDR form completed.        

## 2016-03-22 NOTE — Transfer of Care (Signed)
Immediate Anesthesia Transfer of Care Note  Patient: Shane Mcknight  Procedure(s) Performed: Procedure(s): ESOPHAGOGASTRODUODENOSCOPY (EGD) WITH PROPOFOL (N/A)  Patient Location: PACU and Endoscopy Unit  Anesthesia Type:General  Level of Consciousness: awake, alert  and oriented  Airway & Oxygen Therapy: Patient Spontanous Breathing and Patient connected to nasal cannula oxygen  Post-op Assessment: Report given to RN and Post -op Vital signs reviewed and stable  Post vital signs: Reviewed and stable  Last Vitals:  Vitals:   03/22/16 1044  BP: 124/82  Pulse: 71  Resp: 16  Temp: 37 C    Last Pain:  Vitals:   03/22/16 1044  TempSrc: Tympanic         Complications: No apparent anesthesia complications

## 2016-03-25 ENCOUNTER — Encounter: Payer: Self-pay | Admitting: Gastroenterology

## 2016-05-08 ENCOUNTER — Other Ambulatory Visit: Payer: Self-pay | Admitting: Internal Medicine

## 2016-05-16 ENCOUNTER — Telehealth: Payer: Self-pay | Admitting: *Deleted

## 2016-05-16 ENCOUNTER — Telehealth: Payer: Self-pay | Admitting: Internal Medicine

## 2016-05-16 ENCOUNTER — Other Ambulatory Visit: Payer: Self-pay | Admitting: Internal Medicine

## 2016-05-16 NOTE — Telephone Encounter (Signed)
[facility] Patient Name: Kacyn Punches DOB: 1950-10-06 Initial Comment Caller states he is having chest tightness and tinggling in his arms. Yellow mucus and congestion and a slight cough. Nurse Assessment Nurse: Markus Daft, RN, Sherre Poot Date/Time (Eastern Time): 05/16/2016 9:17:35 AM Confirm and document reason for call. If symptomatic, describe symptoms. ---Caller states he is having chest tightness and tingling in both upper arms. He's had a slightly productive cough with yellow mucous, and some nasal congestion. S/S started with all these s/s including CP in last week. Does the patient have any new or worsening symptoms? ---Yes Will a triage be completed? ---Yes Related visit to physician within the last 2 weeks? ---No Does the PT have any chronic conditions? (i.e. diabetes, asthma, etc.) ---Yes List chronic conditions. ---Type 2 dm, psoriatic arthritis Is this a behavioral health or substance abuse call? ---No Guidelines Guideline Title Affirmed Question Affirmed Notes Cough - Acute Productive Patient sounds very sick or weak to the triager Final Disposition User Go to ED Now (or PCP triage) Markus Daft, RN, Sherre Poot Comments Denies CP now. Starts out every morning at 6-8/10. Mild ache in back/rib cage at 1-2/10, and increases randomly like someone is stabbing him. Pt has appt for tomorrow at 11:15 am. RN needs to see what we can do to have him seen today. Nothing available today at [facility]. Pt had disconnected. Msg left to call appt line back to see about appt today at another office. Referrals REFERRED TO PCP OFFICE Disagree/Comply: Comply

## 2016-05-16 NOTE — Telephone Encounter (Signed)
Dr.Cook your 11:15 a.m.

## 2016-05-16 NOTE — Telephone Encounter (Signed)
Shane Mcknight spoke with patient . Patient was offered appointment at [facility] office. Patient declined.

## 2016-05-16 NOTE — Telephone Encounter (Signed)
Is there any other offices pt could be seen at per [facility] Note?

## 2016-05-16 NOTE — Telephone Encounter (Signed)
Spoke with pt and he stated that he isn't feeling bad right now. He is at work and was told about appt at our [place] office in which he declined. He stated he will come to appt tomorrow that was previously scheduled.

## 2016-05-16 NOTE — Telephone Encounter (Signed)
Patient is currently having chest tighness with tingling that runs down both arms. She also has yellow mucus along with congestion and a slight cough. Pt was transferred to the Nurse Line for chest tightness.  Pt contact [phone] FYI Patient has been scheduled to see Dr Lacinda Axon on 04/04 for congestion/cough

## 2016-05-16 NOTE — Telephone Encounter (Signed)
There are 15 minute acute slots at [facility] at 3pm

## 2016-05-17 ENCOUNTER — Ambulatory Visit (INDEPENDENT_AMBULATORY_CARE_PROVIDER_SITE_OTHER): Payer: BLUE CROSS/BLUE SHIELD | Admitting: Family Medicine

## 2016-05-17 ENCOUNTER — Encounter: Payer: Self-pay | Admitting: Family Medicine

## 2016-05-17 VITALS — BP 128/70 | HR 71 | Temp 98.7°F | Wt 198.5 lb

## 2016-05-17 DIAGNOSIS — R0789 Other chest pain: Secondary | ICD-10-CM | POA: Insufficient documentation

## 2016-05-17 NOTE — Progress Notes (Signed)
Pre visit review using our clinic review tool, if applicable. No additional management support is needed unless otherwise documented below in the visit note. 

## 2016-05-17 NOTE — Progress Notes (Signed)
Subjective:  Patient ID: Shane Mcknight, male    DOB: December 25, 1950  Age: 66 y.o. MRN: [id number]  CC: Chest tightness  HPI:  66 year old male with a history of GERD, esophagitis, Mallory-Weiss tear, DM 2, hypertension, presents with the above complaint.  Patient reports a two-week history of chest tightness. Located centrally and radiates outward bilaterally. He describes it as a "numbness/tingling". He states that usually occurs in the morning and occasionally in the afternoon. Is not associated with exertion. No associated shortness of breath. He states that last for 15-20 minutes and then resolves. He reports occasional cough. No fevers or chills. No reports of PND or orthopnea. No diaphoresis. Does not appear to be associated with by mouth intake. He is being followed closely by GI. Patient feels that this may be pulmonary in nature. However, he states that he has annual spirometry at work which he passes without difficulty. No known exacerbating or relieving factors. No other associated symptoms. No other complaints at this time.  Social Hx    Social History   Social History  . Marital status: Married    Spouse name: N/A  . Number of children: N/A  . Years of education: N/A   Social History Main Topics  . Smoking status: Former Smoker    Quit date: 07/06/1982  . Smokeless tobacco: Never Used  . Alcohol use 0.0 oz/week  . Drug use: No  . Sexual activity: Not Asked   Other Topics Concern  . None   Social History Narrative  . None    Review of Systems  Constitutional: Negative.   Respiratory: Positive for chest tightness. Negative for shortness of breath.    Objective:  BP 128/70   Pulse 71   Temp 98.7 F (37.1 C) (Oral)   Wt 198 lb 8 oz (90 kg)   SpO2 96%   BMI 30.18 kg/m   BP/Weight 05/17/2016 05/21/3218 02/19/4268  Systolic BP 623 762 831  Diastolic BP 70 63 76  Wt. (Lbs) 198.5 - 193.2  BMI 30.18 - 29.38   Physical Exam  Constitutional: He is oriented to person,  place, and time. He appears well-developed. No distress.  Cardiovascular: Normal rate and regular rhythm.   Pulmonary/Chest: Effort normal. He has no wheezes. He has no rales.  Abdominal: Soft. He exhibits no distension. There is no tenderness.  Neurological: He is alert and oriented to person, place, and time.  Psychiatric: He has a normal mood and affect.  Vitals reviewed.   Lab Results  Component Value Date   WBC 8.2 02/06/2016   HGB 12.7 (L) 02/06/2016   HCT 37.1 (L) 02/06/2016   PLT 213.0 02/06/2016   GLUCOSE 102 (H) 02/06/2016   CHOL 162 08/04/2015   TRIG 181 (H) 12/03/2015   HDL 35.90 (L) 08/04/2015   LDLCALC 100 (H) 08/04/2015   ALT 19 02/06/2016   AST 14 02/06/2016   NA 140 02/06/2016   K 3.6 02/20/2016   CL 103 02/06/2016   CREATININE 0.97 02/06/2016   BUN 18 02/06/2016   CO2 28 02/06/2016   TSH 1.93 04/07/2015   PSA 1.50 04/07/2015   INR 1.15 12/03/2015   HGBA1C 6.2 08/04/2015   MICROALBUR 2.3 (H) 08/04/2015    Assessment & Plan:   Problem List Items Addressed This Visit    Chest tightness - Primary    New problem. Uncertain etiology/prognosis at this time. I'm very concerned about a cardiac etiology despite his atypical presentation. His EKG today was unremarkable. It revealed  sinus bradycardia at a rate of 52. No ST or T-wave changes. Patient has known diffuse coronary calcifications via CT. His lung exam is unremarkable. Arranging for cardiology referral for stress and echocardiogram.      Relevant Orders   EKG 12-Lead (Completed)   Ambulatory referral to Cardiology     Follow-up: Closely with PCP  Holliday Stafford

## 2016-05-17 NOTE — Assessment & Plan Note (Signed)
New problem. Uncertain etiology/prognosis at this time. I'm very concerned about a cardiac etiology despite his atypical presentation. His EKG today was unremarkable. It revealed sinus bradycardia at a rate of 52. No ST or T-wave changes. Patient has known diffuse coronary calcifications via CT. His lung exam is unremarkable. Arranging for cardiology referral for stress and echocardiogram.

## 2016-05-21 ENCOUNTER — Encounter: Payer: Self-pay | Admitting: Cardiology

## 2016-05-21 ENCOUNTER — Ambulatory Visit (INDEPENDENT_AMBULATORY_CARE_PROVIDER_SITE_OTHER): Payer: BLUE CROSS/BLUE SHIELD | Admitting: Cardiology

## 2016-05-21 VITALS — BP 120/78 | HR 82 | Ht 68.0 in | Wt 198.0 lb

## 2016-05-21 DIAGNOSIS — I10 Essential (primary) hypertension: Secondary | ICD-10-CM | POA: Diagnosis not present

## 2016-05-21 DIAGNOSIS — R079 Chest pain, unspecified: Secondary | ICD-10-CM | POA: Diagnosis not present

## 2016-05-21 NOTE — Progress Notes (Signed)
Cardiology Office Note   Date:  05/21/2016   ID:  Shane Mcknight, DOB [birth year], MRN [id number]  Referring Doctor:  Einar Pheasant, MD   Cardiologist:   Wende Bushy, MD   Reason for consultation:  Chief Complaint  Patient presents with  . Other    New patient. Referred by Dr. Lacinda Axon chest tightness. Meds reviewed verbally with patient.       History of Present Illness: Shane Mcknight is a 66 y.o. male who is being seen today for the evaluation of Chest pain at the request of Coral Spikes, DO.  Patient reports chest pain described as tightness or pressure in the chest. This has been going on for 3 weeks now. He describes it as severe intensity, very uncomfortable, starts at the center of the chest, radiating outwards to both arms and up the neck. It last 8-10 minutes at a time, on and off sometimes on a daily basis. He cannot really pinpoint exactly what brings on the symptom. Sometimes happens with exertion, sometimes while at rest. Not particularly related to any meals. Similar to his symptoms for reflux or indigestion.  He has had significant history of GI problems: Dysphagia, stricture, hiatal hernia, indigestion, and more recently esophageal tear (Mallory-Weiss). Also told that he's had some esophageal spasm during an EGD.  Patient denies shortness of breath, PND, orthopnea, edema, palpitations. No loss of consciousness.  ROS:  Please see the history of present illness. Aside from mentioned under HPI, all other systems are reviewed and negative.     Past Medical History:  Diagnosis Date  . Abnormal liver function tests   . Allergic state   . Arthritis    spine, hands, shoulder with previous cuff tear  . Colon polyp   . DDD (degenerative disc disease), lumbosacral   . Diabetes mellitus ([place])    type 2 since 09/16/2011  . Elevated transaminase level   . Ganglion cyst of finger of right hand   . GERD (gastroesophageal reflux disease)   . Guaiac positive stools   . Hiatal  hernia   . HNP (herniated nucleus pulposus), lumbar   . Hypercholesterolemia   . Hypertension   . Lumbar radiculitis   . Migraine aura without headache   . Psoriasis   . Psoriatic arthritis ([place])   . Sleep apnea     Past Surgical History:  Procedure Laterality Date  . ARTHROPLASTY     right thumb; left thumb  . BALLOON DILATION N/A 05/29/2015   Procedure: BALLOON DILATION;  Surgeon: Lollie Sails, MD;  Location: [facility] ENDOSCOPY;  Service: Endoscopy;  Laterality: N/A;  . BLEPHAROPLASTY    . CARPAL TUNNEL RELEASE     right  . CHOLECYSTECTOMY    . COLONOSCOPY    . CYST EXCISION  04/14/2015   tendon sheath cyst excision; right ring finger cyst removed and trigger finger realease  . ESOPHAGOGASTRODUODENOSCOPY    . ESOPHAGOGASTRODUODENOSCOPY (EGD) WITH PROPOFOL N/A 05/29/2015   Procedure: ESOPHAGOGASTRODUODENOSCOPY (EGD) WITH PROPOFOL;  Surgeon: Lollie Sails, MD;  Location: [facility] ENDOSCOPY;  Service: Endoscopy;  Laterality: N/A;  . ESOPHAGOGASTRODUODENOSCOPY (EGD) WITH PROPOFOL N/A 07/04/2015   Procedure: ESOPHAGOGASTRODUODENOSCOPY (EGD) WITH PROPOFOL;  Surgeon: Lollie Sails, MD;  Location: [facility] ENDOSCOPY;  Service: Endoscopy;  Laterality: N/A;  . ESOPHAGOGASTRODUODENOSCOPY (EGD) WITH PROPOFOL N/A 01/04/2016   Procedure: ESOPHAGOGASTRODUODENOSCOPY (EGD) WITH PROPOFOL;  Surgeon: Lollie Sails, MD;  Location: [facility] ENDOSCOPY;  Service: Endoscopy;  Laterality: N/A;  . ESOPHAGOGASTRODUODENOSCOPY (EGD) WITH PROPOFOL  N/A 03/22/2016   Procedure: ESOPHAGOGASTRODUODENOSCOPY (EGD) WITH PROPOFOL;  Surgeon: Lollie Sails, MD;  Location: [facility] ENDOSCOPY;  Service: Endoscopy;  Laterality: N/A;  . FOREIGN BODY REMOVAL N/A 12/03/2015   Procedure: FOREIGN BODY REMOVAL;  Surgeon: Wilford Corner, MD;  Location: [facility] ENDOSCOPY;  Service: Endoscopy;  Laterality: N/A;  . HERNIA REPAIR    . LUMBAR DISC SURGERY    . NASAL SINUS SURGERY    . ROTATOR CUFF REPAIR    . TONSILLECTOMY        reports that he quit smoking about 33 years ago. He has never used smokeless tobacco. He reports that he drinks alcohol. He reports that he does not use drugs.   family history includes Hypertension in his mother; Lung cancer in his father.   Outpatient Medications Prior to Visit  Medication Sig Dispense Refill  . aspirin EC 81 MG tablet Take 81 mg by mouth daily.    Marland Kitchen atenolol (TENORMIN) 50 MG tablet Take 1 tablet (50 mg total) by mouth 2 (two) times daily. 60 tablet 7  . COSENTYX SENSOREADY PEN 150 MG/ML SOAJ Inject 1 Dose into the skin every 30 (thirty) days. Due 49/70/2637  10  . folic acid (FOLVITE) 1 MG tablet Take 1 mg by mouth daily.    Marland Kitchen gabapentin (NEURONTIN) 300 MG capsule Take 300 mg by mouth 3 (three) times daily as needed.    . hydrochlorothiazide (HYDRODIURIL) 25 MG tablet TAKE 1 TABLET BY MOUTH EVERY DAY 30 tablet 0  . losartan (COZAAR) 50 MG tablet TAKE 1 TABLET BY MOUTH EVERY DAY 90 tablet 0  . metFORMIN (GLUCOPHAGE) 500 MG tablet TAKE 4 TABLETS BY MOUTH EVERY EVENING. 120 tablet 0  . Multiple Vitamin (MULTIVITAMIN) tablet Take 1 tablet by mouth daily.    . pantoprazole (PROTONIX) 40 MG tablet Take 1 tablet (40 mg total) by mouth 2 (two) times daily. 60 tablet 0  . predniSONE (DELTASONE) 10 MG tablet Take 4 tablets x 1 day and then decrease by 1/2 tablet per day until down to zero mg. 17 tablet 0  . saccharomyces boulardii (FLORASTOR) 250 MG capsule Take 250 mg by mouth 2 (two) times daily.    . traMADol (ULTRAM) 50 MG tablet Take 1 tablet (50 mg total) by mouth 2 (two) times daily as needed. 40 tablet 0  . vitamin E 400 UNIT capsule Take 400 Units by mouth daily.     No facility-administered medications prior to visit.      Allergies: Amoxicillin; Ephedrine; Levsin [hyoscyamine sulfate]; Naprosyn [naproxen]; Penicillins; Sudafed [pseudoephedrine hcl]; Vancomycin; Vicodin [hydrocodone-acetaminophen]; Other; and Shellfish allergy    PHYSICAL EXAM: VS:  BP 120/78 (BP  Location: Right Arm, Patient Position: Sitting, Cuff Size: Normal)   Pulse 82   Ht 5\' 8"  (1.727 m)   Wt 198 lb (89.8 kg)   BMI 30.11 kg/m  , Body mass index is 30.11 kg/m. Wt Readings from Last 3 Encounters:  05/21/16 198 lb (89.8 kg)  05/17/16 198 lb 8 oz (90 kg)  02/20/16 193 lb 3.2 oz (87.6 kg)    GENERAL:  well developed, well nourished, obese, not in acute distress HEENT: normocephalic, pink conjunctivae, anicteric sclerae, no xanthelasma, normal dentition, oropharynx clear NECK:  no neck vein engorgement, JVP normal, no hepatojugular reflux, carotid upstroke brisk and symmetric, no bruit, no thyromegaly, no lymphadenopathy LUNGS:  good respiratory effort, clear to auscultation bilaterally CV:  PMI not displaced, no thrills, no lifts, S1 and S2 within normal limits, no palpable S3  or S4, no murmurs, no rubs, no gallops ABD:  Soft, nontender, nondistended, normoactive bowel sounds, no abdominal aortic bruit, no hepatomegaly, no splenomegaly MS: nontender back, no kyphosis, no scoliosis, no joint deformities EXT:  2+ DP/PT pulses, no edema, no varicosities, no cyanosis, no clubbing SKIN: warm, nondiaphoretic, normal turgor, no ulcers NEUROPSYCH: alert, oriented to person, place, and time, sensory/motor grossly intact, normal mood, appropriate affect  Recent Labs: 12/05/2015: Magnesium 1.6 02/06/2016: ALT 19; BUN 18; Creatinine, Ser 0.97; Hemoglobin 12.7; Platelets 213.0; Sodium 140 02/20/2016: Potassium 3.6   Lipid Panel    Component Value Date/Time   CHOL 162 08/04/2015 0822   TRIG 181 (H) 12/03/2015 1510   HDL 35.90 (L) 08/04/2015 0822   CHOLHDL 5 08/04/2015 0822   VLDL 26.2 08/04/2015 0822   LDLCALC 100 (H) 08/04/2015 2297     Other studies Reviewed:  EKG:  The ekg from 05/17/2016 was personally reviewed by me and it revealed sinus rhythm, 52 BPM.  Additional studies/ records that were reviewed personally reviewed by me today include: None available   ASSESSMENT AND  PLAN:  Chest pain, chest tightness Not always exertional but he does have several risk factors for CAD: Age, gender, diabetes, hypertension Recommend to rule out ischemia with pharmacologic nuclear stress this. Recommend echocardiogram. If cardiac testing is unremarkable, his symptoms may be related to esophageal spasm. Patient already on low-dose aspirin. Continue risk factor modification.  Hypertension BP is well controlled. Continue monitoring BP. Continue current medical therapy and lifestyle changes.   Current medicines are reviewed at length with the patient today.  The patient does not have concerns regarding medicines.  Labs/ tests ordered today include:  Orders Placed This Encounter  Procedures  . NM Myocar Multi W/Spect W/Ritchey Motion / EF  . ECHOCARDIOGRAM COMPLETE    Disposition:   FU with Cardiology after tests   Thank you for this consultation. We will forwarding this consultation to referring physician.   Signed, Wende Bushy, MD  05/21/2016 2:59 PM    [place]  This note was generated in part with voice recognition software and I apologize for any typographical errors that were not detected and corrected.

## 2016-05-21 NOTE — Patient Instructions (Addendum)
Testing/Procedures: Your physician has requested that you have an echocardiogram. Echocardiography is a painless test that uses sound waves to create images of your heart. It provides your doctor with information about the size and shape of your heart and how well your heart's chambers and valves are working. This procedure takes approximately one hour. There are no restrictions for this procedure.  [place]  Your caregiver has ordered a Stress Test with nuclear imaging. The purpose of this test is to evaluate the blood supply to your heart muscle. This procedure is referred to as a "Non-Invasive Stress Test." This is because other than having an IV started in your vein, nothing is inserted or "invades" your body. Cardiac stress tests are done to find areas of poor blood flow to the heart by determining the extent of coronary artery disease (CAD). Some patients exercise on a treadmill, which naturally increases the blood flow to your heart, while others who are  unable to walk on a treadmill due to physical limitations have a pharmacologic/chemical stress agent called Lexiscan . This medicine will mimic walking on a treadmill by temporarily increasing your coronary blood flow.   Please note: these test may take anywhere between 2-4 hours to complete  PLEASE REPORT TO [facility] MEDICAL MALL ENTRANCE  THE VOLUNTEERS AT THE FIRST DESK WILL DIRECT YOU WHERE TO GO  Date of Procedure:_Friday May 18, 2018___  Arrival Time for Procedure:_Arrive at 07:45AM__  Instructions regarding medication:   __X__ : Hold diabetes medication Metformin (Glucophage) morning of procedure  __X__:  Hold atenolol the night before procedure and morning of procedure    PLEASE NOTIFY THE OFFICE AT LEAST 24 HOURS IN ADVANCE IF YOU ARE UNABLE TO KEEP YOUR APPOINTMENT.  [phone] AND  PLEASE NOTIFY NUCLEAR MEDICINE AT [facility] AT LEAST 24 HOURS IN ADVANCE IF YOU ARE UNABLE TO KEEP YOUR APPOINTMENT. [phone]  How to  prepare for your Myoview test:  1. Do not eat or drink after midnight 2. No caffeine for 24 hours prior to test 3. No smoking 24 hours prior to test. 4. Your medication may be taken with water.  If your doctor stopped a medication because of this test, do not take that medication. 5. Ladies, please do not wear dresses.  Skirts or pants are appropriate. Please wear a short sleeve shirt. 6. No perfume, cologne or lotion. 7. Wear comfortable walking shoes. No heels!   Follow-Up: Your physician recommends that you schedule a follow-up appointment as needed. We will call you with results and if needed schedule follow up at that time.   It was a pleasure seeing you today here in the office. Please do not hesitate to give [place] a call back if you have any further questions. [place], BSN   Echocardiogram An echocardiogram, or echocardiography, uses sound waves (ultrasound) to produce an image of your heart. The echocardiogram is simple, painless, obtained within a short period of time, and offers valuable information to your health care provider. The images from an echocardiogram can provide information such as:  Evidence of coronary artery disease (CAD).  Heart size.  Heart muscle function.  Heart valve function.  Aneurysm detection.  Evidence of a past heart attack.  Fluid buildup around the heart.  Heart muscle thickening.  Assess heart valve function. Tell a health care provider about:  Any allergies you have.  All medicines you are taking, including vitamins, herbs, eye drops, creams, and over-the-counter medicines.  Any problems you or family  members have had with anesthetic medicines.  Any blood disorders you have.  Any surgeries you have had.  Any medical conditions you have.  Whether you are pregnant or may be pregnant. What happens before the procedure? No special preparation is needed. Eat and drink normally. What happens during the  procedure?  In order to produce an image of your heart, gel will be applied to your chest and a wand-like tool (transducer) will be moved over your chest. The gel will help transmit the sound waves from the transducer. The sound waves will harmlessly bounce off your heart to allow the heart images to be captured in real-time motion. These images will then be recorded.  You may need an IV to receive a medicine that improves the quality of the pictures. What happens after the procedure? You may return to your normal schedule including diet, activities, and medicines, unless your health care provider tells you otherwise. This information is not intended to replace advice given to you by your health care provider. Make sure you discuss any questions you have with your health care provider. Document Released: 12/29/1999 Document Revised: 08/19/2015 Document Reviewed: 09/07/2012 Elsevier Interactive Patient Education  2017 [place]. Pharmacologic Stress Electrocardiogram Introduction A pharmacologic stress electrocardiogram is a heart (cardiac) test that uses nuclear imaging to evaluate the blood supply to your heart. This test may also be called a pharmacologic stress electrocardiography. Pharmacologic means that a medicine is used to increase your heart rate and blood pressure. This stress test is done to find areas of poor blood flow to the heart by determining the extent of coronary artery disease (CAD). Some people exercise on a treadmill, which naturally increases the blood flow to the heart. For those people unable to exercise on a treadmill, a medicine is used. This medicine stimulates your heart and will cause your heart to beat harder and more quickly, as if you were exercising. Pharmacologic stress tests can help determine:  The adequacy of blood flow to your heart during increased levels of activity in order to clear you for discharge home.  The extent of coronary artery blockage caused  by CAD.  Your prognosis if you have suffered a heart attack.  The effectiveness of cardiac procedures done, such as an angioplasty, which can increase the circulation in your coronary arteries.  Causes of chest pain or pressure. LET [facility] CARE PROVIDER KNOW ABOUT:  Any allergies you have.  All medicines you are taking, including vitamins, herbs, eye drops, creams, and over-the-counter medicines.  Previous problems you or members of your family have had with the use of anesthetics.  Any blood disorders you have.  Previous surgeries you have had.  Medical conditions you have.  Possibility of pregnancy, if this applies.  If you are currently breastfeeding. RISKS AND COMPLICATIONS Generally, this is a safe procedure. However, as with any procedure, complications can occur. Possible complications include:  You develop pain or pressure in the following areas:  Chest.  Jaw or neck.  Between your shoulder blades.  Radiating down your left arm.  Headache.  Dizziness or light-headedness.  Shortness of breath.  Increased or irregular heartbeat.  Low blood pressure.  Nausea or vomiting.  Flushing.  Redness going up the arm and slight pain during injection of medicine.  Heart attack (rare). BEFORE THE PROCEDURE  Avoid all forms of caffeine for 24 hours before your test or as directed by your health care provider. This includes coffee, tea (even decaffeinated tea), caffeinated sodas, chocolate,  cocoa, and certain pain medicines.  Follow your health care provider's instructions regarding eating and drinking before the test.  Take your medicines as directed at regular times with water unless instructed otherwise. Exceptions may include:  If you have diabetes, ask how you are to take your insulin or pills. It is common to adjust insulin dosing the morning of the test.  If you are taking beta-blocker medicines, it is important to talk to your health care provider  about these medicines well before the date of your test. Taking beta-blocker medicines may interfere with the test. In some cases, these medicines need to be changed or stopped 24 hours or more before the test.  If you wear a nitroglycerin patch, it may need to be removed prior to the test. Ask your health care provider if the patch should be removed before the test.  If you use an inhaler for any breathing condition, bring it with you to the test.  If you are an outpatient, bring a snack so you can eat right after the stress phase of the test.  Do not smoke for 4 hours prior to the test or as directed by your health care provider.  Do not apply lotions, powders, creams, or oils on your chest prior to the test.  Wear comfortable shoes and clothing. Let your health care provider know if you were unable to complete or follow the preparations for your test. PROCEDURE  Multiple patches (electrodes) will be put on your chest. If needed, small areas of your chest may be shaved to get better contact with the electrodes. Once the electrodes are attached to your body, multiple wires will be attached to the electrodes, and your heart rate will be monitored.  An IV access will be started. A nuclear trace (isotope) is given. The isotope may be given intravenously, or it may be swallowed. Nuclear refers to several types of radioactive isotopes, and the nuclear isotope lights up the arteries so that the nuclear images are clear. The isotope is absorbed by your body. This results in low radiation exposure.  A resting nuclear image is taken to show how your heart functions at rest.  A medicine is given through the IV access.  A second scan is done about 1 hour after the medicine injection and determines how your heart functions under stress.  During this stress phase, you will be connected to an electrocardiogram machine. Your blood pressure and oxygen levels will be monitored. What to expect after the  procedure  Your heart rate and blood pressure will be monitored after the test.  You may return to your normal schedule, including diet,activities, and medicines, unless your health care provider tells you otherwise. This information is not intended to replace advice given to you by your health care provider. Make sure you discuss any questions you have with your health care provider. Document Released: 05/19/2008 Document Revised: 06/08/2015 Document Reviewed: 07/10/2015 Elsevier Interactive Patient Education  2017 [employer].

## 2016-05-31 ENCOUNTER — Encounter
Admission: RE | Admit: 2016-05-31 | Discharge: 2016-05-31 | Disposition: A | Discharge status: Home / Self Care | Payer: BLUE CROSS/BLUE SHIELD | Source: Ambulatory Visit | Attending: Cardiology | Admitting: Cardiology

## 2016-05-31 DIAGNOSIS — R079 Chest pain, unspecified: Secondary | ICD-10-CM | POA: Insufficient documentation

## 2016-05-31 LAB — NM MYOCAR MULTI W/SPECT W/WALL MOTION / EF
CHL CUP NUCLEAR SDS: 7
CHL CUP NUCLEAR SRS: 3
CHL CUP STRESS STAGE 2 HR: 71 {beats}/min
CHL CUP STRESS STAGE 2 SPEED: 0 mph
CHL CUP STRESS STAGE 3 GRADE: 0 %
CHL CUP STRESS STAGE 5 SPEED: 0 mph
CHL CUP STRESS STAGE 6 DBP: 84 mmHg
CHL CUP STRESS STAGE 6 GRADE: 0 %
CHL CUP STRESS STAGE 6 HR: 69 {beats}/min
CHL CUP STRESS STAGE 6 SBP: 132 mmHg
CSEPED: 0 min
CSEPEDS: 0 s
CSEPHR: 64 %
CSEPPMHR: 58 %
Estimated workload: 1 METS
LV sys vol: 28 mL
LVDIAVOL: 64 mL (ref 62–150)
MPHR: 153 {beats}/min
Peak HR: 90 {beats}/min
Rest HR: 65 {beats}/min
SSS: 9
Stage 1 HR: 57 {beats}/min
Stage 2 Grade: 0 %
Stage 3 HR: 71 {beats}/min
Stage 3 Speed: 0 mph
Stage 4 Grade: 0 %
Stage 4 HR: 90 {beats}/min
Stage 4 Speed: 0 mph
Stage 5 Grade: 0 %
Stage 5 HR: 76 {beats}/min
Stage 6 Speed: 0 mph
TID: 1.02

## 2016-05-31 MED ORDER — REGADENOSON 0.4 MG/5ML IV SOLN
0.4000 mg | Freq: Once | INTRAVENOUS | Status: AC
  Administered 2016-05-31: 0.4 mg via INTRAVENOUS

## 2016-05-31 MED ORDER — TECHNETIUM TC 99M TETROFOSMIN IV KIT
30.0000 | PACK | Freq: Once | INTRAVENOUS | Status: AC | PRN
  Administered 2016-05-31: 32.67 via INTRAVENOUS

## 2016-05-31 MED ORDER — TECHNETIUM TC 99M TETROFOSMIN IV KIT
13.0000 | PACK | Freq: Once | INTRAVENOUS | Status: AC | PRN
  Administered 2016-05-31: 12.31 via INTRAVENOUS

## 2016-06-01 ENCOUNTER — Other Ambulatory Visit: Payer: Self-pay | Admitting: Internal Medicine

## 2016-06-05 ENCOUNTER — Telehealth: Payer: Self-pay | Admitting: Cardiology

## 2016-06-05 MED ORDER — ISOSORBIDE MONONITRATE ER 30 MG PO TB24: 30.0000 mg | ORAL_TABLET | Freq: Every day | ORAL | 3 refills | Status: DC

## 2016-06-05 NOTE — Telephone Encounter (Signed)
Reviewed results and recommendations with patient. Instructed him to start the isosorbide mononitrate once daily and sent in to his pharmacy. Also reviewed symptoms to monitor for and instructions on going to ED if they persist or worsen. Offered him appointment today and he states that he is not able today. Let him know that physician would like him to come in this week. He reported that he would be available anytime after 10 AM on Friday. Let him know that I would check with scheduling and that someone would be in touch to get him scheduled. He was appreciative for the call back and had no further questions at this time.

## 2016-06-05 NOTE — Telephone Encounter (Signed)
Left voicemail message to call back  

## 2016-06-05 NOTE — Telephone Encounter (Signed)
Pt would like stress test results.  

## 2016-06-05 NOTE — Telephone Encounter (Signed)
Spoke with patient and he just wanted to know his stress test results because he had some chest tightness yesterday. Let him know that results are pending physician review and that I will be in touch by the end of the day with his results.

## 2016-06-05 NOTE — Telephone Encounter (Signed)
Spoke with patient and confirmed appointment scheduled for Friday 5/25 at 2:30 PM with Ignacia Bayley NP. Instructed him to please go to ED if pain persists or worsens. He verbalized understanding of instructions with no further questions at this time.

## 2016-06-05 NOTE — Telephone Encounter (Signed)
Pt returning our call °Please call back ° °

## 2016-06-05 NOTE — Telephone Encounter (Signed)
Stress test is abnormal (intermediate risk). I suggest starting isosorbide mononitrate 30 mg daily and scheduling Shane Mcknight to be seen by myself or another provider this week to discuss results and further testing in more detail. If his chest pain worsens, he should proceed to the ER for evaluation.

## 2016-06-07 ENCOUNTER — Ambulatory Visit (INDEPENDENT_AMBULATORY_CARE_PROVIDER_SITE_OTHER): Payer: BLUE CROSS/BLUE SHIELD | Admitting: Nurse Practitioner

## 2016-06-07 ENCOUNTER — Encounter: Payer: Self-pay | Admitting: Nurse Practitioner

## 2016-06-07 VITALS — BP 112/82 | HR 69 | Ht 68.5 in | Wt 194.0 lb

## 2016-06-07 DIAGNOSIS — I10 Essential (primary) hypertension: Secondary | ICD-10-CM | POA: Diagnosis not present

## 2016-06-07 DIAGNOSIS — I200 Unstable angina: Secondary | ICD-10-CM

## 2016-06-07 DIAGNOSIS — E119 Type 2 diabetes mellitus without complications: Secondary | ICD-10-CM | POA: Diagnosis not present

## 2016-06-07 MED ORDER — NITROGLYCERIN 0.4 MG SL SUBL: 0.4000 mg | SUBLINGUAL_TABLET | SUBLINGUAL | 3 refills | Status: DC | PRN

## 2016-06-07 MED ORDER — HYDROCHLOROTHIAZIDE 25 MG PO TABS: 12.5000 mg | ORAL_TABLET | Freq: Every day | ORAL | 0 refills | Status: DC

## 2016-06-07 NOTE — Patient Instructions (Addendum)
Medication Instructions:  Your physician has recommended you make the following change in your medication:  1. DECREASE Hydrochlorothiazide 1/2 tablet once daily 2. Nitroglycerin 0.4 mg under the tongue as needed for chest pain.  The proper use and anticipated side effects of nitroglycerine has been carefully explained.  If a single episode of chest pain is not relieved by one tablet, the patient will try another within 5 minutes; and if this doesn't relieve the pain, the patient is instructed to call 911 for transportation to an emergency department.   Testing/Procedures: [facility] Cardiac Cath Instructions   You are scheduled for a Cardiac Cath on:_Friday June 1, 2018__  Please arrive at _06:30_am on the day of your procedure  Please expect a call from our [place] to pre-register you  Do not eat/drink anything after midnight  Someone will need to drive you home  It is recommended someone be with you for the first 24 hours after your procedure  Wear clothes that are easy to get on/off and wear slip on shoes if possible   Medications bring a current list of all medications with you  _X__ Do not Metformin the day before or morning of procedure. Do not take these medications before your procedure:Hydrochlorothiazide    _X__ You may take all of your medications the morning of your procedure with enough water to swallow safely  Day of your procedure: Arrive at the [place] entrance.  Free valet service is available.  After entering the [facility] please check-in at the registration desk (1st desk on your right) to receive your armband. After receiving your armband someone will escort you to the cardiac cath/special procedures waiting area.  The usual length of stay after your procedure is about 2 to 3 hours.  This can vary.  If you have any questions, please call our office at [phone], or you may call the cardiac cath lab at [facility] directly at  [phone]   Follow-Up: Your physician recommends that you schedule a follow-up appointment in: 2 weeks with Dr. Fletcher Anon or Ignacia Bayley NP.  It was a pleasure seeing you today here in the office. Please do not hesitate to give [place] a call back if you have any further questions. [place], BSN

## 2016-06-07 NOTE — Progress Notes (Signed)
Cardiology Clinic Note   Patient Name: Shane Mcknight Date of Encounter: 06/07/2016  Primary Care Provider:  Einar Pheasant, MD Primary Cardiologist:  Formerly A. Yvone Neu, MD.  Patient Profile    66 y/o ? with a h/o HTN, HL, DMII, GERD, HH, Mallory Weiss tear, and recent Unstable Angina, who presents for f/u related to an abnl stress test.   Past Medical History    Past Medical History:  Diagnosis Date  . Abnormal liver function tests   . Allergic state   . Arthritis    spine, hands, shoulder with previous cuff tear  . Colon polyp   . DDD (degenerative disc disease), lumbosacral   . Dysphagia   . Elevated transaminase level   . Esophageal stricture    a. s/p multiple dilations - last 03/2016.  . Ganglion cyst of finger of right hand   . GERD (gastroesophageal reflux disease)   . Guaiac positive stools   . Hiatal hernia   . HNP (herniated nucleus pulposus), lumbar   . Hypercholesterolemia   . Hypertension   . Lumbar radiculitis   . Mallory-Weiss tear    a. 11/2015.  . Migraine aura without headache   . Psoriatic arthritis ([place])   . Sleep apnea   . Type II diabetes mellitus ([place])   . Unstable angina ([place])    a.  11/2015 CT Chest/Abd: Coronray Ca2+ noted;  b. Myoview: EF 45-54%, medium defect of mod severity in basal inferoseptal, basal inferior, mid inferoseptal, and mid inferior region consistent with infarct and peri-infarct ischemia-->intermediate risk.   Past Surgical History:  Procedure Laterality Date  . ARTHROPLASTY     right thumb; left thumb  . BALLOON DILATION N/A 05/29/2015   Procedure: BALLOON DILATION;  Surgeon: Lollie Sails, MD;  Location: [facility] ENDOSCOPY;  Service: Endoscopy;  Laterality: N/A;  . BLEPHAROPLASTY    . CARPAL TUNNEL RELEASE     right  . CHOLECYSTECTOMY    . COLONOSCOPY    . CYST EXCISION  04/14/2015   tendon sheath cyst excision; right ring finger cyst removed and trigger finger realease  . ESOPHAGOGASTRODUODENOSCOPY    .  ESOPHAGOGASTRODUODENOSCOPY (EGD) WITH PROPOFOL N/A 05/29/2015   Procedure: ESOPHAGOGASTRODUODENOSCOPY (EGD) WITH PROPOFOL;  Surgeon: Lollie Sails, MD;  Location: [facility] ENDOSCOPY;  Service: Endoscopy;  Laterality: N/A;  . ESOPHAGOGASTRODUODENOSCOPY (EGD) WITH PROPOFOL N/A 07/04/2015   Procedure: ESOPHAGOGASTRODUODENOSCOPY (EGD) WITH PROPOFOL;  Surgeon: Lollie Sails, MD;  Location: [facility] ENDOSCOPY;  Service: Endoscopy;  Laterality: N/A;  . ESOPHAGOGASTRODUODENOSCOPY (EGD) WITH PROPOFOL N/A 01/04/2016   Procedure: ESOPHAGOGASTRODUODENOSCOPY (EGD) WITH PROPOFOL;  Surgeon: Lollie Sails, MD;  Location: [facility] ENDOSCOPY;  Service: Endoscopy;  Laterality: N/A;  . ESOPHAGOGASTRODUODENOSCOPY (EGD) WITH PROPOFOL N/A 03/22/2016   Procedure: ESOPHAGOGASTRODUODENOSCOPY (EGD) WITH PROPOFOL;  Surgeon: Lollie Sails, MD;  Location: [facility] ENDOSCOPY;  Service: Endoscopy;  Laterality: N/A;  . FOREIGN BODY REMOVAL N/A 12/03/2015   Procedure: FOREIGN BODY REMOVAL;  Surgeon: Wilford Corner, MD;  Location: [facility] ENDOSCOPY;  Service: Endoscopy;  Laterality: N/A;  . HERNIA REPAIR    . LUMBAR DISC SURGERY    . NASAL SINUS SURGERY    . ROTATOR CUFF REPAIR    . TONSILLECTOMY      Allergies  Allergies  Allergen Reactions  . Amoxicillin   . Ephedrine Other (See Comments)    pain  . Levsin [Hyoscyamine Sulfate] Other (See Comments)    Urinary retention  . Naprosyn [Naproxen] Other (See Comments)    GI upset   .  Penicillins   . Sudafed [Pseudoephedrine Hcl]   . Vancomycin   . Vicodin [Hydrocodone-Acetaminophen]     Other reaction(s): Other (See Comments) GI Upset  . Other Rash    Paper tape  . Shellfish Allergy Rash    History of Present Illness    66 y/o ? with the above complex PMH including HTN, HL, DM II, and chronic dysphagia in the setting of GERD, Esophagitis, esophageal stricture, and Mallory Weiss tear.  He has undergone multiple dilations of his esophagus in the past, most recent of  which occurred in March 2018. In November 2017, he had a severe Mallory Weiss tear in the setting of dysphagia and repeated vomiting. EGD showed a large adherent clot at the GE junction. He was on full ventilator support for airway protection.CT of the chest and abdomen did not show any perforation of his esophagus. There was an incidental finding of coronary calcification. He has since been followed closely by gastroenterology.  He was recently seen in clinic by A. Ingal, MD secondary to intermittent rest and exertional substernal chest pressure radiating through to his back and shoulders. Stress testing was undertaken and showed a medium defect of moderate severity in the basal anteroseptal, basal inferior, mid inferoseptal, and mid inferior region consistent with infarct and peri-infarct ischemia. EF was 45-54% and thus the study was deemed intermediate risk. Follow-up was subsequently arranged. Since his last visit, he has continued to have intermittent chest discomfort. He was typically notices this after getting up in the morning and getting ready for work. He has also had exertional symptoms when walking back and forth to his workshop in the backyard. There is no predictability when symptoms might occur however. Symptoms typically last 5-10 minutes and resolve with rest and deep breathing. He denies palpitations, dyspnea, PND, orthopnea, syncope, edema, or early satiety. He has occasionally had lightheadedness and has documented blood pressures in the mid 80s when that occurs. He is on multiple antihypertensives.  Home Medications    Prior to Admission medications   Medication Sig Start Date End Date Taking? Authorizing Provider  aspirin EC 81 MG tablet Take 81 mg by mouth daily.   Yes [provider]  atenolol (TENORMIN) 50 MG tablet Take 1 tablet (50 mg total) by mouth 2 (two) times daily. 01/10/16  Yes Scott, Randell Patient, MD  COSENTYX SENSOREADY PEN 150 MG/ML SOAJ Inject 1 Dose into the  skin every 30 (thirty) days. Due 12/16/2015 04/06/15  Yes [provider]  folic acid (FOLVITE) 1 MG tablet Take 1 mg by mouth daily.   Yes [provider]  gabapentin (NEURONTIN) 300 MG capsule Take 300 mg by mouth 3 (three) times daily as needed. 05/14/13  Yes Einar Pheasant, MD  hydrochlorothiazide (HYDRODIURIL) 25 MG tablet Take 0.5 tablets (12.5 mg total) by mouth daily. 06/07/16  Yes Rogelia Mire, NP  isosorbide mononitrate (IMDUR) 30 MG 24 hr tablet Take 1 tablet (30 mg total) by mouth daily. 06/05/16 09/03/16 Yes End, Harrell Gave, MD  losartan (COZAAR) 50 MG tablet TAKE 1 TABLET BY MOUTH EVERY DAY 05/17/16  Yes Einar Pheasant, MD  metFORMIN (GLUCOPHAGE) 500 MG tablet TAKE 4 TABLETS BY MOUTH EVERY EVENING. 06/03/16  Yes Einar Pheasant, MD  Multiple Vitamin (MULTIVITAMIN) tablet Take 1 tablet by mouth daily.   Yes [provider]  pantoprazole (PROTONIX) 40 MG tablet Take 1 tablet (40 mg total) by mouth 2 (two) times daily. 12/08/15  Yes Dustin Flock, MD  saccharomyces boulardii (FLORASTOR) 250 MG capsule  Take 250 mg by mouth 2 (two) times daily.   Yes [provider]  traMADol (ULTRAM) 50 MG tablet Take 1 tablet (50 mg total) by mouth 2 (two) times daily as needed. 08/11/15  Yes Einar Pheasant, MD  vitamin E 400 UNIT capsule Take 400 Units by mouth daily.   Yes [provider]  nitroGLYCERIN (NITROSTAT) 0.4 MG SL tablet Place 1 tablet (0.4 mg total) under the tongue every 5 (five) minutes as needed. 06/07/16   Rogelia Mire, NP    Family History    Family History  Problem Relation Age of Onset  . Lung cancer Father   . Hypertension Mother     Social History    Social History   Social History  . Marital status: Married    Spouse name: N/A  . Number of children: N/A  . Years of education: N/A   Occupational History  . Not on file.   Social History Main Topics  . Smoking status: Former Smoker    Quit date: 07/06/1982  .  Smokeless tobacco: Never Used  . Alcohol use 0.0 oz/week     Comment: occasional drink.  . Drug use: No  . Sexual activity: Not on file   Other Topics Concern  . Not on file   Social History Narrative   Lives locally with wife.  Active but does not routinely exercise.     Review of Systems    General:  No chills, fever, night sweats or weight changes.  Cardiovascular:  +++ Rest and exertional chest pain, no dyspnea on exertion, edema, orthopnea, palpitations, paroxysmal nocturnal dyspnea. Dermatological: No rash, lesions/masses Respiratory: No cough, dyspnea Urologic: No hematuria, dysuria Abdominal:   His chronic dysphagia which has been stable. No nausea, vomiting, diarrhea, bright red blood per rectum, melena, or hematemesis Neurologic:  No visual changes, wkns, changes in mental status. All other systems reviewed and are otherwise negative except as noted above.  Physical Exam    VS:  BP 112/82 (BP Location: Left Arm, Patient Position: Sitting, Cuff Size: Normal)   Pulse 69   Ht 5' 8.5" (1.74 m)   Wt 194 lb (88 kg)   BMI 29.07 kg/m  , BMI Body mass index is 29.07 kg/m. GEN: Well nourished, well developed, in no acute distress.  HEENT: normal.  Neck: Supple, no JVD, carotid bruits, or masses. Cardiac: RRR, no murmurs, rubs, or gallops. No clubbing, cyanosis, edema.  Radials/DP/PT 2+ and equal bilaterally. He has significant pain with palpation over the radial artery secondary to prior procedure. Normal Allen's test bilaterally. Respiratory:  Respirations regular and unlabored, clear to auscultation bilaterally. GI: Soft, nontender, nondistended, BS + x 4. MS: no deformity or atrophy. Skin: warm and dry, no rash. Neuro:  Strength and sensation are intact. Psych: Normal affect.  Accessory Clinical Findings    ECG - Regular sinus rhythm, 69, no acute ST or T changes.  Assessment & Plan   1.  Unstable angina: Patient is a several week history of intermittent rest and  exertional substernal chest tightness radiating to his back and shoulders. Symptoms typically last 5-10 minutes resolve spontaneously. There are no predictability symptoms. Symptoms are different than prior dysphagia symptoms. He recently underwent stress testing which showed a medium defect of moderate severity in the basal anteroseptal, basal inferior, mid inferoseptal, and mid inferior region consistent with infarct and peri-infarct ischemia and an EF of 45-54%. Isosorbide mononitrate was recently added to his regimen and he has not yet noticed  significant difference in symptoms. We discussed options for additional evaluation including diagnostic catheterization.  The patient understands that risks include but are not limited to stroke (1 in 1000), death (1 in 44), kidney failure [usually temporary] (1 in 500), bleeding (1 in 200), allergic reaction [possibly serious] (1 in 200), and agrees to proceed.  Of note, he does have chronic pain in the right radial area due to prior procedure.  He will not likely tolerate TR band on that side post-procedure (pain simply w/ checking pulse today).  Plan on either L radial or R femoral approach. We will check labs today. I have provided him with a prescription for sublingual nitroglycerin. He is scheduled for diagnostic catheterization next Friday. He knows to call 911 if he has recurrent/recalcitrant chest discomfort. He remains on aspirin, beta blocker, and ARB therapy. He is not currently on statin but given prior history of diabetes, coronary calcification on CT, and probable coronary artery disease, we will likely plan to initiate statin therapy post catheterization.  2. Essential hypertension: Blood pressure is stable today. He brings a list of pressures with him and he does sometimes run low associated with lightheadedness. I have asked him to reduce his hydrochlorothiazide to 12.5 mg daily. He will otherwise remain on beta blocker, ARB, and long-acting nitrate  therapy.  3. Type 2 diabetes mellitus: This is managed by primary care. A1c was 6.2 in July 2017. He will hold metformin on the day of his catheterization.   4. History of dysphagia with GERD/hiatal hernia/esophageal stricture/Mallory-Weiss tear: He is followed closely by gastroenterology.  5. Disposition: Precatheterization labs today. Diagnostic catheterization next week. Follow-up in 2 weeks.   Murray Hodgkins, NP 06/07/2016, 3:34 PM

## 2016-06-08 LAB — CBC WITH DIFFERENTIAL/PLATELET
BASOS ABS: 0 10*3/uL (ref 0.0–0.2)
BASOS: 0 %
EOS (ABSOLUTE): 0.3 10*3/uL (ref 0.0–0.4)
Eos: 5 %
Hematocrit: 38.5 % (ref 37.5–51.0)
Hemoglobin: 12.7 g/dL — ABNORMAL LOW (ref 13.0–17.7)
Immature Grans (Abs): 0 10*3/uL (ref 0.0–0.1)
Immature Granulocytes: 0 %
Lymphocytes Absolute: 1.9 10*3/uL (ref 0.7–3.1)
Lymphs: 28 %
MCH: 27.9 pg (ref 26.6–33.0)
MCHC: 33 g/dL (ref 31.5–35.7)
MCV: 85 fL (ref 79–97)
Monocytes Absolute: 0.9 10*3/uL (ref 0.1–0.9)
Monocytes: 13 %
NEUTROS ABS: 3.7 10*3/uL (ref 1.4–7.0)
NEUTROS PCT: 54 %
PLATELETS: 242 10*3/uL (ref 150–379)
RBC: 4.55 x10E6/uL (ref 4.14–5.80)
RDW: 14.6 % (ref 12.3–15.4)
WBC: 6.9 10*3/uL (ref 3.4–10.8)

## 2016-06-08 LAB — BASIC METABOLIC PANEL
BUN / CREAT RATIO: 11 (ref 10–24)
BUN: 12 mg/dL (ref 8–27)
CHLORIDE: 102 mmol/L (ref 96–106)
CO2: 22 mmol/L (ref 18–29)
CREATININE: 1.1 mg/dL (ref 0.76–1.27)
Calcium: 9.6 mg/dL (ref 8.6–10.2)
GFR calc Af Amer: 80 mL/min/{1.73_m2} (ref >59)
GFR calc non Af Amer: 69 mL/min/{1.73_m2} (ref >59)
GLUCOSE: 92 mg/dL (ref 65–99)
POTASSIUM: 3.7 mmol/L (ref 3.5–5.2)
SODIUM: 141 mmol/L (ref 134–144)

## 2016-06-08 LAB — PROTIME-INR
INR: 1.1 (ref 0.8–1.2)
PROTHROMBIN TIME: 11.5 s (ref 9.1–12.0)

## 2016-06-14 ENCOUNTER — Encounter
Admission: RE | Disposition: A | Discharge status: Home / Self Care | Payer: Self-pay | Source: Ambulatory Visit | Attending: Cardiovascular Disease

## 2016-06-14 ENCOUNTER — Ambulatory Visit
Admission: RE | Admit: 2016-06-14 | Discharge: 2016-06-15 | Disposition: A | Discharge status: Home / Self Care | Payer: BLUE CROSS/BLUE SHIELD | Source: Ambulatory Visit | Attending: Cardiovascular Disease | Admitting: Cardiovascular Disease

## 2016-06-14 DIAGNOSIS — Z881 Allergy status to other antibiotic agents status: Secondary | ICD-10-CM | POA: Diagnosis not present

## 2016-06-14 DIAGNOSIS — M19019 Primary osteoarthritis, unspecified shoulder: Secondary | ICD-10-CM | POA: Insufficient documentation

## 2016-06-14 DIAGNOSIS — Z8249 Family history of ischemic heart disease and other diseases of the circulatory system: Secondary | ICD-10-CM | POA: Insufficient documentation

## 2016-06-14 DIAGNOSIS — Z801 Family history of malignant neoplasm of trachea, bronchus and lung: Secondary | ICD-10-CM | POA: Insufficient documentation

## 2016-06-14 DIAGNOSIS — Z7984 Long term (current) use of oral hypoglycemic drugs: Secondary | ICD-10-CM | POA: Insufficient documentation

## 2016-06-14 DIAGNOSIS — M19042 Primary osteoarthritis, left hand: Secondary | ICD-10-CM | POA: Diagnosis not present

## 2016-06-14 DIAGNOSIS — Z79899 Other long term (current) drug therapy: Secondary | ICD-10-CM | POA: Insufficient documentation

## 2016-06-14 DIAGNOSIS — I208 Other forms of angina pectoris: Secondary | ICD-10-CM

## 2016-06-14 DIAGNOSIS — K210 Gastro-esophageal reflux disease with esophagitis: Secondary | ICD-10-CM | POA: Insufficient documentation

## 2016-06-14 DIAGNOSIS — Z880 Allergy status to penicillin: Secondary | ICD-10-CM | POA: Insufficient documentation

## 2016-06-14 DIAGNOSIS — E7800 Pure hypercholesterolemia, unspecified: Secondary | ICD-10-CM | POA: Insufficient documentation

## 2016-06-14 DIAGNOSIS — Z87891 Personal history of nicotine dependence: Secondary | ICD-10-CM | POA: Diagnosis not present

## 2016-06-14 DIAGNOSIS — R9439 Abnormal result of other cardiovascular function study: Secondary | ICD-10-CM

## 2016-06-14 DIAGNOSIS — Z888 Allergy status to other drugs, medicaments and biological substances status: Secondary | ICD-10-CM | POA: Diagnosis not present

## 2016-06-14 DIAGNOSIS — I10 Essential (primary) hypertension: Secondary | ICD-10-CM | POA: Diagnosis not present

## 2016-06-14 DIAGNOSIS — Z91013 Allergy to seafood: Secondary | ICD-10-CM | POA: Diagnosis not present

## 2016-06-14 DIAGNOSIS — I2510 Atherosclerotic heart disease of native coronary artery without angina pectoris: Secondary | ICD-10-CM | POA: Diagnosis not present

## 2016-06-14 DIAGNOSIS — Z7982 Long term (current) use of aspirin: Secondary | ICD-10-CM | POA: Insufficient documentation

## 2016-06-14 DIAGNOSIS — E1159 Type 2 diabetes mellitus with other circulatory complications: Secondary | ICD-10-CM

## 2016-06-14 DIAGNOSIS — I200 Unstable angina: Secondary | ICD-10-CM

## 2016-06-14 DIAGNOSIS — Z885 Allergy status to narcotic agent status: Secondary | ICD-10-CM | POA: Diagnosis not present

## 2016-06-14 DIAGNOSIS — Z86010 Personal history of colonic polyps: Secondary | ICD-10-CM | POA: Diagnosis not present

## 2016-06-14 DIAGNOSIS — K219 Gastro-esophageal reflux disease without esophagitis: Secondary | ICD-10-CM | POA: Diagnosis present

## 2016-06-14 DIAGNOSIS — E785 Hyperlipidemia, unspecified: Secondary | ICD-10-CM

## 2016-06-14 DIAGNOSIS — Z91048 Other nonmedicinal substance allergy status: Secondary | ICD-10-CM | POA: Diagnosis not present

## 2016-06-14 DIAGNOSIS — E119 Type 2 diabetes mellitus without complications: Secondary | ICD-10-CM | POA: Diagnosis not present

## 2016-06-14 DIAGNOSIS — I2089 Other forms of angina pectoris: Secondary | ICD-10-CM

## 2016-06-14 DIAGNOSIS — N183 Chronic kidney disease, stage 3 (moderate): Secondary | ICD-10-CM

## 2016-06-14 DIAGNOSIS — Z886 Allergy status to analgesic agent status: Secondary | ICD-10-CM | POA: Diagnosis not present

## 2016-06-14 DIAGNOSIS — M19041 Primary osteoarthritis, right hand: Secondary | ICD-10-CM | POA: Insufficient documentation

## 2016-06-14 DIAGNOSIS — M479 Spondylosis, unspecified: Secondary | ICD-10-CM | POA: Diagnosis not present

## 2016-06-14 DIAGNOSIS — I25119 Atherosclerotic heart disease of native coronary artery with unspecified angina pectoris: Secondary | ICD-10-CM

## 2016-06-14 DIAGNOSIS — E0822 Diabetes mellitus due to underlying condition with diabetic chronic kidney disease: Secondary | ICD-10-CM

## 2016-06-14 HISTORY — PX: LEFT HEART CATH: CATH118248

## 2016-06-14 HISTORY — PX: CORONARY STENT INTERVENTION: CATH118234

## 2016-06-14 HISTORY — DX: Atherosclerotic heart disease of native coronary artery without angina pectoris: I25.10

## 2016-06-14 LAB — GLUCOSE, CAPILLARY
Glucose-Capillary: 145 mg/dL — ABNORMAL HIGH (ref 65–99)
Glucose-Capillary: 114 mg/dL — ABNORMAL HIGH (ref 65–99)
Glucose-Capillary: 137 mg/dL — ABNORMAL HIGH (ref 65–99)

## 2016-06-14 LAB — POCT ACTIVATED CLOTTING TIME: Activated Clotting Time: 241 seconds

## 2016-06-14 SURGERY — LEFT HEART CATH
Anesthesia: Moderate Sedation

## 2016-06-14 MED ORDER — ONDANSETRON HCL 4 MG/2ML IJ SOLN: 4.0000 mg | Freq: Four times a day (QID) | INTRAMUSCULAR | Status: DC | PRN

## 2016-06-14 MED ORDER — NITROGLYCERIN 0.4 MG SL SUBL: 0.4000 mg | SUBLINGUAL_TABLET | SUBLINGUAL | Status: DC | PRN

## 2016-06-14 MED ORDER — SODIUM CHLORIDE 0.9 % WEIGHT BASED INFUSION: 1.0000 mL/kg/h | INTRAVENOUS | Status: AC

## 2016-06-14 MED ORDER — CLOPIDOGREL BISULFATE 300 MG PO TABS
ORAL_TABLET | ORAL | Status: AC
  Filled 2016-06-14: qty 2

## 2016-06-14 MED ORDER — GABAPENTIN 300 MG PO CAPS: 300.0000 mg | ORAL_CAPSULE | Freq: Three times a day (TID) | ORAL | Status: DC | PRN

## 2016-06-14 MED ORDER — MIDAZOLAM HCL 2 MG/2ML IJ SOLN
INTRAMUSCULAR | Status: AC
  Filled 2016-06-14: qty 2

## 2016-06-14 MED ORDER — CLOPIDOGREL BISULFATE 75 MG PO TABS: 75.0000 mg | ORAL_TABLET | Freq: Every day | ORAL | 6 refills | Status: DC

## 2016-06-14 MED ORDER — SODIUM CHLORIDE 0.9% FLUSH
3.0000 mL | Freq: Two times a day (BID) | INTRAVENOUS | Status: DC
  Administered 2016-06-14: 3 mL via INTRAVENOUS

## 2016-06-14 MED ORDER — MIDAZOLAM HCL 2 MG/2ML IJ SOLN
INTRAMUSCULAR | Status: DC | PRN
  Administered 2016-06-14 (×2): 1 mg via INTRAVENOUS

## 2016-06-14 MED ORDER — SODIUM CHLORIDE 0.9 % IV SOLN: 250.0000 mL | INTRAVENOUS | Status: DC | PRN

## 2016-06-14 MED ORDER — SODIUM CHLORIDE 0.9 % WEIGHT BASED INFUSION: 1.0000 mL/kg/h | INTRAVENOUS | Status: DC

## 2016-06-14 MED ORDER — SACCHAROMYCES BOULARDII 250 MG PO CAPS
250.0000 mg | ORAL_CAPSULE | Freq: Two times a day (BID) | ORAL | Status: DC
  Administered 2016-06-14: 250 mg via ORAL
  Filled 2016-06-14 (×3): qty 1

## 2016-06-14 MED ORDER — ASPIRIN 81 MG PO CHEW
81.0000 mg | CHEWABLE_TABLET | ORAL | Status: AC
  Administered 2016-06-14: 81 mg via ORAL

## 2016-06-14 MED ORDER — ATENOLOL 25 MG PO TABS
50.0000 mg | ORAL_TABLET | Freq: Two times a day (BID) | ORAL | Status: DC
  Administered 2016-06-14 – 2016-06-15 (×2): 50 mg via ORAL
  Filled 2016-06-14 (×2): qty 2

## 2016-06-14 MED ORDER — CLOPIDOGREL BISULFATE 75 MG PO TABS
ORAL_TABLET | ORAL | Status: DC | PRN
  Administered 2016-06-14: 600 mg via ORAL

## 2016-06-14 MED ORDER — VERAPAMIL HCL 2.5 MG/ML IV SOLN
INTRAVENOUS | Status: AC
  Filled 2016-06-14: qty 2

## 2016-06-14 MED ORDER — SECUKINUMAB 150 MG/ML ~~LOC~~ SOAJ: 1.0000 | SUBCUTANEOUS | Status: DC

## 2016-06-14 MED ORDER — IOPAMIDOL (ISOVUE-300) INJECTION 61%
INTRAVENOUS | Status: DC | PRN
  Administered 2016-06-14: 135 mL via INTRA_ARTERIAL

## 2016-06-14 MED ORDER — NITROGLYCERIN 5 MG/ML IV SOLN
INTRAVENOUS | Status: AC
  Filled 2016-06-14: qty 10

## 2016-06-14 MED ORDER — SODIUM CHLORIDE 0.9% FLUSH: 3.0000 mL | Freq: Two times a day (BID) | INTRAVENOUS | Status: DC

## 2016-06-14 MED ORDER — HYDROCHLOROTHIAZIDE 25 MG PO TABS
12.5000 mg | ORAL_TABLET | Freq: Every day | ORAL | Status: DC
  Administered 2016-06-15: 12.5 mg via ORAL
  Filled 2016-06-14: qty 1

## 2016-06-14 MED ORDER — CLOPIDOGREL BISULFATE 75 MG PO TABS
75.0000 mg | ORAL_TABLET | Freq: Every day | ORAL | Status: DC
  Administered 2016-06-15: 75 mg via ORAL
  Filled 2016-06-14: qty 1

## 2016-06-14 MED ORDER — LOSARTAN POTASSIUM 50 MG PO TABS
50.0000 mg | ORAL_TABLET | Freq: Every day | ORAL | Status: DC
  Administered 2016-06-15: 50 mg via ORAL
  Filled 2016-06-14: qty 1

## 2016-06-14 MED ORDER — HEPARIN SODIUM (PORCINE) 1000 UNIT/ML IJ SOLN
INTRAMUSCULAR | Status: DC | PRN
  Administered 2016-06-14: 5000 [IU] via INTRAVENOUS
  Administered 2016-06-14: 4500 [IU] via INTRAVENOUS
  Administered 2016-06-14: 2000 [IU] via INTRAVENOUS

## 2016-06-14 MED ORDER — FOLIC ACID 1 MG PO TABS
1.0000 mg | ORAL_TABLET | Freq: Every day | ORAL | Status: DC
  Administered 2016-06-14 – 2016-06-15 (×2): 1 mg via ORAL
  Filled 2016-06-14 (×2): qty 1

## 2016-06-14 MED ORDER — SODIUM CHLORIDE 0.9% FLUSH: 3.0000 mL | INTRAVENOUS | Status: DC | PRN

## 2016-06-14 MED ORDER — FENTANYL CITRATE (PF) 100 MCG/2ML IJ SOLN
INTRAMUSCULAR | Status: DC | PRN
  Administered 2016-06-14 (×2): 50 ug via INTRAVENOUS

## 2016-06-14 MED ORDER — FENTANYL CITRATE (PF) 100 MCG/2ML IJ SOLN
INTRAMUSCULAR | Status: AC
  Filled 2016-06-14: qty 2

## 2016-06-14 MED ORDER — ADULT MULTIVITAMIN W/MINERALS CH
1.0000 | ORAL_TABLET | Freq: Every day | ORAL | Status: DC
  Filled 2016-06-14: qty 1

## 2016-06-14 MED ORDER — TRAMADOL HCL 50 MG PO TABS: 50.0000 mg | ORAL_TABLET | Freq: Two times a day (BID) | ORAL | Status: DC | PRN

## 2016-06-14 MED ORDER — ONE-DAILY MULTI VITAMINS PO TABS: 1.0000 | ORAL_TABLET | Freq: Every day | ORAL | Status: DC

## 2016-06-14 MED ORDER — HEPARIN SODIUM (PORCINE) 1000 UNIT/ML IJ SOLN
INTRAMUSCULAR | Status: AC
  Filled 2016-06-14: qty 1

## 2016-06-14 MED ORDER — ROSUVASTATIN CALCIUM 20 MG PO TABS: 20.0000 mg | ORAL_TABLET | Freq: Every day | ORAL | 6 refills | Status: DC

## 2016-06-14 MED ORDER — SODIUM CHLORIDE 0.9 % WEIGHT BASED INFUSION
3.0000 mL/kg/h | INTRAVENOUS | Status: DC
  Administered 2016-06-14: 3 mL/kg/h via INTRAVENOUS

## 2016-06-14 MED ORDER — PANTOPRAZOLE SODIUM 40 MG PO TBEC
40.0000 mg | DELAYED_RELEASE_TABLET | Freq: Two times a day (BID) | ORAL | Status: DC
  Administered 2016-06-14 – 2016-06-15 (×2): 40 mg via ORAL
  Filled 2016-06-14 (×2): qty 1

## 2016-06-14 MED ORDER — ASPIRIN 81 MG PO CHEW
CHEWABLE_TABLET | ORAL | Status: AC
  Filled 2016-06-14: qty 3

## 2016-06-14 MED ORDER — ISOSORBIDE MONONITRATE ER 30 MG PO TB24
30.0000 mg | ORAL_TABLET | Freq: Every day | ORAL | Status: DC
  Administered 2016-06-14 – 2016-06-15 (×2): 30 mg via ORAL
  Filled 2016-06-14 (×2): qty 1

## 2016-06-14 MED ORDER — ASPIRIN 81 MG PO CHEW
CHEWABLE_TABLET | ORAL | Status: AC
  Filled 2016-06-14: qty 1

## 2016-06-14 MED ORDER — HEPARIN (PORCINE) IN NACL 2-0.9 UNIT/ML-% IJ SOLN
INTRAMUSCULAR | Status: AC
  Filled 2016-06-14: qty 500

## 2016-06-14 MED ORDER — VITAMIN E 180 MG (400 UNIT) PO CAPS
400.0000 [IU] | ORAL_CAPSULE | Freq: Every day | ORAL | Status: DC
Start: 2016-06-14 — End: 2016-06-15
  Administered 2016-06-14 – 2016-06-15 (×2): 400 [IU] via ORAL
  Filled 2016-06-14 (×2): qty 1

## 2016-06-14 MED ORDER — ASPIRIN EC 81 MG PO TBEC
81.0000 mg | DELAYED_RELEASE_TABLET | Freq: Every day | ORAL | Status: DC
  Administered 2016-06-15: 81 mg via ORAL
  Filled 2016-06-14: qty 1

## 2016-06-14 MED ORDER — ASPIRIN 81 MG PO CHEW
CHEWABLE_TABLET | ORAL | Status: DC | PRN
  Administered 2016-06-14: 243 mg via ORAL

## 2016-06-14 MED ORDER — NITROGLYCERIN 1 MG/10 ML FOR IR/CATH LAB
INTRA_ARTERIAL | Status: DC | PRN
  Administered 2016-06-14: 200 ug

## 2016-06-14 MED ORDER — ROSUVASTATIN CALCIUM 20 MG PO TABS
20.0000 mg | ORAL_TABLET | Freq: Every day | ORAL | Status: DC
  Administered 2016-06-14: 20 mg via ORAL
  Filled 2016-06-14: qty 1

## 2016-06-14 MED ORDER — ACETAMINOPHEN 325 MG PO TABS: 650.0000 mg | ORAL_TABLET | ORAL | Status: DC | PRN

## 2016-06-14 SURGICAL SUPPLY — 14 items
BALLN TREK RX 2.5X12 (BALLOONS) ×4
BALLOON TREK RX 2.5X12 (BALLOONS) ×2 IMPLANT
CATH 5F 110X4 TIG (CATHETERS) ×4 IMPLANT
CATH INFINITI 5FR JL4 (CATHETERS) ×4 IMPLANT
CATH VISTA GUIDE 6FR JR4 (CATHETERS) ×4 IMPLANT
DEVICE INFLAT 30 PLUS (MISCELLANEOUS) ×4 IMPLANT
DEVICE RAD TR BAND REGULAR (VASCULAR PRODUCTS) ×4 IMPLANT
GLIDESHEATH SLEND SS 6F .021 (SHEATH) ×4 IMPLANT
KIT MANI 3VAL PERCEP (MISCELLANEOUS) ×4 IMPLANT
PACK CARDIAC CATH (CUSTOM PROCEDURE TRAY) ×4 IMPLANT
STENT RESOLUTE ONYX 2.5X15 (Permanent Stent) ×4 IMPLANT
WIRE HITORQ VERSACORE ST 145CM (WIRE) ×4 IMPLANT
WIRE ROSEN-J .035X260CM (WIRE) ×4 IMPLANT
WIRE RUNTHROUGH .014X180CM (WIRE) ×4 IMPLANT

## 2016-06-14 NOTE — Plan of Care (Signed)
Problem: Safety: Goal: Ability to remain free from injury will improve Outcome: Progressing Pt is a moderate fall risk. Pt instructed to call from assistance with activity.

## 2016-06-14 NOTE — Progress Notes (Signed)
Patient resting without complaints, post heart cath with stent placement. Wife at bedside. Post ekg done per orders for stent placement. Sinus rhythm per monitor. Currently having air removal from tr band per protocol. No bleeding nor hematoma visible at this time. Taking po's without difficulty. Report called to Tammy RN on telemetry room 237 with plan reviewed.

## 2016-06-14 NOTE — Discharge Summary (Signed)
Discharge Summary    Patient ID: Shane Mcknight,  MRN: [id number], DOB/AGE: 01-28-1950 66 y.o.  Admit date: 06/14/2016 Discharge date: 06/15/2016   Primary Care Provider: Einar Pheasant Primary Cardiologist: Jerilynn Mages. Fletcher Anon, MD   Discharge Diagnoses    Principal Problem:   Effort angina [facility])  **s/p PCI/DES of the RCA this admission.  Active Problems:   Abnormal stress test   CAD (coronary artery disease)   Diabetes mellitus (HCC)   Hypertension   GERD (gastroesophageal reflux disease)   Hyperlipidemia  Allergies Allergies  Allergen Reactions  . Amoxicillin   . Ephedrine Other (See Comments)    pain  . Levsin [Hyoscyamine Sulfate] Other (See Comments)    Urinary retention  . Naprosyn [Naproxen] Other (See Comments)    GI upset   . Penicillins   . Sudafed [Pseudoephedrine Hcl]   . Vancomycin   . Vicodin [Hydrocodone-Acetaminophen]     Other reaction(s): Other (See Comments) GI Upset  . Other Rash    Paper tape  . Shellfish Allergy Rash    Diagnostic Studies/Procedures    Cardiac Catheterization and Percutaneous Coronary Intervention 6.1.2018  Coronary Findings  Dominance: Right  Left Main  Vessel is angiographically normal.  Ost LM to LM lesion, 40% stenosed.  Left Anterior Descending  Prox LAD lesion, 30% stenosed.  Mid LAD lesion, 50% stenosed.  First Diagonal Branch  Vessel is small in size.  Ost 1st Diag to 1st Diag lesion, 60% stenosed.  Second Diagonal Branch  There is mild disease in the vessel.  Third Diagonal Branch  Vessel is angiographically normal.  Left Circumflex  Vessel is small. Vessel is angiographically normal.  Ost Cx lesion, 60% stenosed.  First Obtuse Marginal Branch  1st Mrg lesion, 99% stenosed.  Second Obtuse Marginal Branch  Vessel is angiographically normal.  Third Obtuse Marginal Branch  Vessel is angiographically normal.  Right Coronary Artery  Prox RCA lesion, 60% stenosed. The lesion is mildly calcified.  Dist RCA  lesion, 95% stenosed.  Angioplasty: The distal RCA was successfully treated using a 2.5 x 15 mm Resolute Onyx DES.  There is no residual stenosis post intervention.  Right Posterior Descending Artery  The vessel exhibits minimal luminal irregularities.  First Right Posterolateral  Vessel is small in size. The vessel exhibits minimal luminal irregularities.  Second Right Posterolateral  2nd RPLB lesion, 60% stenosed.  Third Right Posterolateral  Vessel is small in size. The vessel exhibits minimal luminal irregularities.   Ventricle The left ventricular size is normal. The left ventricular systolic function is normal. LV end diastolic pressure is normal. The left ventricular ejection fraction is 55-65% by visual estimate. No regional Kiser motion abnormalities. _____________   History of Present Illness     66 y/o ? with a h/o HTN, HL, DM II, GERD, hiatal hernia, Mallory Weiss tear, and esophagitis.  He was recently seen in clinic secondary to intermittent rest and exertional substernal chest pain and pressure radiating to his back and shoulders.  Stress testing showed evidence of basal anteroseptal, basal inferior, mid inferoseptal, and mid inferior infarct with peri-infarct ischemia.  EF was 45-54%.  He was seen in follow-up on 5/25 and arrangements were made for diagnostic catheterization.  Hospital Course     Consultants: None   Pt underwent diagnostic catheterization on 6/1 revealing severe distal RCA and OM1 disease.  The OM1 was a small vessel, while the RCA was suitable for intervention and felt to be the culprit for his stress test  findings.  The RCA was successfully treated using a 2.5 x 15 mm Resolute Onyx DES.  He tolerated the procedure well and has been ambulating post-procedure without difficulty.  He will be discharged home today in good condition.  We have initiated plavix and also statin therapy.  He will f/u in the office in 2 wks.  _____________  Discharge Vitals Blood  pressure (!) 123/56, pulse (!) 55, temperature 98.6 F (37 C), temperature source Oral, resp. rate (!) 22, height 5\' 9"  (1.753 m), weight 190 lb 9.6 oz (86.5 kg), SpO2 97 %.  Filed Weights   06/14/16 0655 06/14/16 1211  Weight: 194 lb (88 kg) 190 lb 9.6 oz (86.5 kg)    Labs & Radiologic Studies    CBC No results for input(s): WBC, NEUTROABS, HGB, HCT, MCV, PLT in the last 72 hours. Basic Metabolic Panel No results for input(s): NA, K, CL, CO2, GLUCOSE, BUN, CREATININE, CALCIUM, MG, PHOS in the last 72 hours. ____________  Nm Myocar Multi W/spect W/Mannella Motion / Ef  Result Date: 05/31/2016  There was no ST segment deviation noted during stress.  Defect 1: There is a medium defect of moderate severity present in the basal inferoseptal, basal inferior, mid inferoseptal and mid inferior location.  Findings consistent with prior myocardial infarction with significant peri-infarct ischemia.  This is an intermediate risk study.  The left ventricular ejection fraction is mildly decreased (45-54%).    Disposition   Pt is being discharged home today in good condition.  Follow-up Plans & Appointments    Follow-up Information    Wellington Hampshire, MD Follow up on 06/25/2016.   Specialty:  Cardiology Why:  4 PM Contact information: [place] [address] [phone]          Discharge Instructions    AMB Referral to Cardiac Rehabilitation - Phase II    Complete by:  As directed    Diagnosis:   Stable Angina Coronary Stents        Discharge Medications   Current Discharge Medication List    START taking these medications   Details  clopidogrel (PLAVIX) 75 MG tablet Take 1 tablet (75 mg total) by mouth daily with breakfast. Qty: 30 tablet, Refills: 6    rosuvastatin (CRESTOR) 20 MG tablet Take 1 tablet (20 mg total) by mouth daily at 6 PM. Qty: 30 tablet, Refills: 6      CONTINUE these medications which have NOT CHANGED   Details    aspirin EC 81 MG tablet Take 81 mg by mouth daily.    atenolol (TENORMIN) 50 MG tablet Take 1 tablet (50 mg total) by mouth 2 (two) times daily. Qty: 60 tablet, Refills: 7    COSENTYX SENSOREADY PEN 150 MG/ML SOAJ Inject 1 Dose into the skin every 30 (thirty) days. Due 12/16/2015 Refills: 10    folic acid (FOLVITE) 1 MG tablet Take 1 mg by mouth daily.    gabapentin (NEURONTIN) 300 MG capsule Take 300 mg by mouth 3 (three) times daily as needed.    hydrochlorothiazide (HYDRODIURIL) 25 MG tablet Take 0.5 tablets (12.5 mg total) by mouth daily. Qty: 30 tablet, Refills: 0    isosorbide mononitrate (IMDUR) 30 MG 24 hr tablet Take 1 tablet (30 mg total) by mouth daily. Qty: 30 tablet, Refills: 3    losartan (COZAAR) 50 MG tablet TAKE 1 TABLET BY MOUTH EVERY DAY Qty: 90 tablet, Refills: 0    metFORMIN (GLUCOPHAGE) 500 MG tablet TAKE 4  TABLETS BY MOUTH EVERY EVENING. Qty: 120 tablet, Refills: 0    Multiple Vitamin (MULTIVITAMIN) tablet Take 1 tablet by mouth daily.    nitroGLYCERIN (NITROSTAT) 0.4 MG SL tablet Place 1 tablet (0.4 mg total) under the tongue every 5 (five) minutes as needed. Qty: 25 tablet, Refills: 3    pantoprazole (PROTONIX) 40 MG tablet Take 1 tablet (40 mg total) by mouth 2 (two) times daily. Qty: 60 tablet, Refills: 0    saccharomyces boulardii (FLORASTOR) 250 MG capsule Take 250 mg by mouth 2 (two) times daily.    traMADol (ULTRAM) 50 MG tablet Take 1 tablet (50 mg total) by mouth 2 (two) times daily as needed. Qty: 40 tablet, Refills: 0      STOP taking these medications     vitamin E 400 UNIT capsule           Outstanding Labs/Studies   Follow-up lipids/lft's in 6 wks (new statin).  Duration of Discharge Encounter   Greater than 30 minutes including physician time.  Signed, Murray Hodgkins NP 06/14/2016, 4:06 PM   History and all data above reviewed.  Patient examined.  He had a good night without chest pain.  Ambulated and ate well.  I  agree with the findings as above.  The patient exam reveals COR:RRR  ,  Lungs: Clear  ,  Abd: Positive bowel sounds, no rebound no guarding, Ext Left wrist without bruising or bleeding.  Pressure dressing is in place  .  All available labs, radiology testing, previous records reviewed. Agree with documented assessment and plan. OK to discharge.  May return to work without limitations.  Follow up has been arranged.  Meds reviewed.    Jeneen Rinks [facility]  8:54 AM  06/15/2016

## 2016-06-14 NOTE — H&P (View-Only) (Signed)
Cardiology Clinic Note   Patient Name: Shane Mcknight Date of Encounter: 06/07/2016  Primary Care Provider:  Einar Pheasant, MD Primary Cardiologist:  Formerly A. Yvone Neu, MD.  Patient Profile    66 y/o ? with a h/o HTN, HL, DMII, GERD, HH, Mallory Weiss tear, and recent Unstable Angina, who presents for f/u related to an abnl stress test.   Past Medical History    Past Medical History:  Diagnosis Date  . Abnormal liver function tests   . Allergic state   . Arthritis    spine, hands, shoulder with previous cuff tear  . Colon polyp   . DDD (degenerative disc disease), lumbosacral   . Dysphagia   . Elevated transaminase level   . Esophageal stricture    a. s/p multiple dilations - last 03/2016.  . Ganglion cyst of finger of right hand   . GERD (gastroesophageal reflux disease)   . Guaiac positive stools   . Hiatal hernia   . HNP (herniated nucleus pulposus), lumbar   . Hypercholesterolemia   . Hypertension   . Lumbar radiculitis   . Mallory-Weiss tear    a. 11/2015.  . Migraine aura without headache   . Psoriatic arthritis ([place])   . Sleep apnea   . Type II diabetes mellitus ([place])   . Unstable angina ([place])    a.  11/2015 CT Chest/Abd: Coronray Ca2+ noted;  b. Myoview: EF 45-54%, medium defect of mod severity in basal inferoseptal, basal inferior, mid inferoseptal, and mid inferior region consistent with infarct and peri-infarct ischemia-->intermediate risk.   Past Surgical History:  Procedure Laterality Date  . ARTHROPLASTY     right thumb; left thumb  . BALLOON DILATION N/A 05/29/2015   Procedure: BALLOON DILATION;  Surgeon: Lollie Sails, MD;  Location: [facility] ENDOSCOPY;  Service: Endoscopy;  Laterality: N/A;  . BLEPHAROPLASTY    . CARPAL TUNNEL RELEASE     right  . CHOLECYSTECTOMY    . COLONOSCOPY    . CYST EXCISION  04/14/2015   tendon sheath cyst excision; right ring finger cyst removed and trigger finger realease  . ESOPHAGOGASTRODUODENOSCOPY    .  ESOPHAGOGASTRODUODENOSCOPY (EGD) WITH PROPOFOL N/A 05/29/2015   Procedure: ESOPHAGOGASTRODUODENOSCOPY (EGD) WITH PROPOFOL;  Surgeon: Lollie Sails, MD;  Location: [facility] ENDOSCOPY;  Service: Endoscopy;  Laterality: N/A;  . ESOPHAGOGASTRODUODENOSCOPY (EGD) WITH PROPOFOL N/A 07/04/2015   Procedure: ESOPHAGOGASTRODUODENOSCOPY (EGD) WITH PROPOFOL;  Surgeon: Lollie Sails, MD;  Location: [facility] ENDOSCOPY;  Service: Endoscopy;  Laterality: N/A;  . ESOPHAGOGASTRODUODENOSCOPY (EGD) WITH PROPOFOL N/A 01/04/2016   Procedure: ESOPHAGOGASTRODUODENOSCOPY (EGD) WITH PROPOFOL;  Surgeon: Lollie Sails, MD;  Location: [facility] ENDOSCOPY;  Service: Endoscopy;  Laterality: N/A;  . ESOPHAGOGASTRODUODENOSCOPY (EGD) WITH PROPOFOL N/A 03/22/2016   Procedure: ESOPHAGOGASTRODUODENOSCOPY (EGD) WITH PROPOFOL;  Surgeon: Lollie Sails, MD;  Location: [facility] ENDOSCOPY;  Service: Endoscopy;  Laterality: N/A;  . FOREIGN BODY REMOVAL N/A 12/03/2015   Procedure: FOREIGN BODY REMOVAL;  Surgeon: Wilford Corner, MD;  Location: [facility] ENDOSCOPY;  Service: Endoscopy;  Laterality: N/A;  . HERNIA REPAIR    . LUMBAR DISC SURGERY    . NASAL SINUS SURGERY    . ROTATOR CUFF REPAIR    . TONSILLECTOMY      Allergies  Allergies  Allergen Reactions  . Amoxicillin   . Ephedrine Other (See Comments)    pain  . Levsin [Hyoscyamine Sulfate] Other (See Comments)    Urinary retention  . Naprosyn [Naproxen] Other (See Comments)    GI upset   .  Penicillins   . Sudafed [Pseudoephedrine Hcl]   . Vancomycin   . Vicodin [Hydrocodone-Acetaminophen]     Other reaction(s): Other (See Comments) GI Upset  . Other Rash    Paper tape  . Shellfish Allergy Rash    History of Present Illness    66 y/o ? with the above complex PMH including HTN, HL, DM II, and chronic dysphagia in the setting of GERD, Esophagitis, esophageal stricture, and Mallory Weiss tear.  He has undergone multiple dilations of his esophagus in the past, most recent of  which occurred in March 2018. In November 2017, he had a severe Mallory Weiss tear in the setting of dysphagia and repeated vomiting. EGD showed a large adherent clot at the GE junction. He was on full ventilator support for airway protection.CT of the chest and abdomen did not show any perforation of his esophagus. There was an incidental finding of coronary calcification. He has since been followed closely by gastroenterology.  He was recently seen in clinic by A. Ingal, MD secondary to intermittent rest and exertional substernal chest pressure radiating through to his back and shoulders. Stress testing was undertaken and showed a medium defect of moderate severity in the basal anteroseptal, basal inferior, mid inferoseptal, and mid inferior region consistent with infarct and peri-infarct ischemia. EF was 45-54% and thus the study was deemed intermediate risk. Follow-up was subsequently arranged. Since his last visit, he has continued to have intermittent chest discomfort. He was typically notices this after getting up in the morning and getting ready for work. He has also had exertional symptoms when walking back and forth to his workshop in the backyard. There is no predictability when symptoms might occur however. Symptoms typically last 5-10 minutes and resolve with rest and deep breathing. He denies palpitations, dyspnea, PND, orthopnea, syncope, edema, or early satiety. He has occasionally had lightheadedness and has documented blood pressures in the mid 80s when that occurs. He is on multiple antihypertensives.  Home Medications    Prior to Admission medications   Medication Sig Start Date End Date Taking? Authorizing Provider  aspirin EC 81 MG tablet Take 81 mg by mouth daily.   Yes [provider]  atenolol (TENORMIN) 50 MG tablet Take 1 tablet (50 mg total) by mouth 2 (two) times daily. 01/10/16  Yes Scott, Randell Patient, MD  COSENTYX SENSOREADY PEN 150 MG/ML SOAJ Inject 1 Dose into the  skin every 30 (thirty) days. Due 12/16/2015 04/06/15  Yes [provider]  folic acid (FOLVITE) 1 MG tablet Take 1 mg by mouth daily.   Yes [provider]  gabapentin (NEURONTIN) 300 MG capsule Take 300 mg by mouth 3 (three) times daily as needed. 05/14/13  Yes Einar Pheasant, MD  hydrochlorothiazide (HYDRODIURIL) 25 MG tablet Take 0.5 tablets (12.5 mg total) by mouth daily. 06/07/16  Yes Rogelia Mire, NP  isosorbide mononitrate (IMDUR) 30 MG 24 hr tablet Take 1 tablet (30 mg total) by mouth daily. 06/05/16 09/03/16 Yes End, Harrell Gave, MD  losartan (COZAAR) 50 MG tablet TAKE 1 TABLET BY MOUTH EVERY DAY 05/17/16  Yes Einar Pheasant, MD  metFORMIN (GLUCOPHAGE) 500 MG tablet TAKE 4 TABLETS BY MOUTH EVERY EVENING. 06/03/16  Yes Einar Pheasant, MD  Multiple Vitamin (MULTIVITAMIN) tablet Take 1 tablet by mouth daily.   Yes [provider]  pantoprazole (PROTONIX) 40 MG tablet Take 1 tablet (40 mg total) by mouth 2 (two) times daily. 12/08/15  Yes Dustin Flock, MD  saccharomyces boulardii (FLORASTOR) 250 MG capsule  Take 250 mg by mouth 2 (two) times daily.   Yes [provider]  traMADol (ULTRAM) 50 MG tablet Take 1 tablet (50 mg total) by mouth 2 (two) times daily as needed. 08/11/15  Yes Einar Pheasant, MD  vitamin E 400 UNIT capsule Take 400 Units by mouth daily.   Yes [provider]  nitroGLYCERIN (NITROSTAT) 0.4 MG SL tablet Place 1 tablet (0.4 mg total) under the tongue every 5 (five) minutes as needed. 06/07/16   Rogelia Mire, NP    Family History    Family History  Problem Relation Age of Onset  . Lung cancer Father   . Hypertension Mother     Social History    Social History   Social History  . Marital status: Married    Spouse name: N/A  . Number of children: N/A  . Years of education: N/A   Occupational History  . Not on file.   Social History Main Topics  . Smoking status: Former Smoker    Quit date: 07/06/1982  .  Smokeless tobacco: Never Used  . Alcohol use 0.0 oz/week     Comment: occasional drink.  . Drug use: No  . Sexual activity: Not on file   Other Topics Concern  . Not on file   Social History Narrative   Lives locally with wife.  Active but does not routinely exercise.     Review of Systems    General:  No chills, fever, night sweats or weight changes.  Cardiovascular:  +++ Rest and exertional chest pain, no dyspnea on exertion, edema, orthopnea, palpitations, paroxysmal nocturnal dyspnea. Dermatological: No rash, lesions/masses Respiratory: No cough, dyspnea Urologic: No hematuria, dysuria Abdominal:   His chronic dysphagia which has been stable. No nausea, vomiting, diarrhea, bright red blood per rectum, melena, or hematemesis Neurologic:  No visual changes, wkns, changes in mental status. All other systems reviewed and are otherwise negative except as noted above.  Physical Exam    VS:  BP 112/82 (BP Location: Left Arm, Patient Position: Sitting, Cuff Size: Normal)   Pulse 69   Ht 5' 8.5" (1.74 m)   Wt 194 lb (88 kg)   BMI 29.07 kg/m  , BMI Body mass index is 29.07 kg/m. GEN: Well nourished, well developed, in no acute distress.  HEENT: normal.  Neck: Supple, no JVD, carotid bruits, or masses. Cardiac: RRR, no murmurs, rubs, or gallops. No clubbing, cyanosis, edema.  Radials/DP/PT 2+ and equal bilaterally. He has significant pain with palpation over the radial artery secondary to prior procedure. Normal Allen's test bilaterally. Respiratory:  Respirations regular and unlabored, clear to auscultation bilaterally. GI: Soft, nontender, nondistended, BS + x 4. MS: no deformity or atrophy. Skin: warm and dry, no rash. Neuro:  Strength and sensation are intact. Psych: Normal affect.  Accessory Clinical Findings    ECG - Regular sinus rhythm, 69, no acute ST or T changes.  Assessment & Plan   1.  Unstable angina: Patient is a several week history of intermittent rest and  exertional substernal chest tightness radiating to his back and shoulders. Symptoms typically last 5-10 minutes resolve spontaneously. There are no predictability symptoms. Symptoms are different than prior dysphagia symptoms. He recently underwent stress testing which showed a medium defect of moderate severity in the basal anteroseptal, basal inferior, mid inferoseptal, and mid inferior region consistent with infarct and peri-infarct ischemia and an EF of 45-54%. Isosorbide mononitrate was recently added to his regimen and he has not yet noticed  significant difference in symptoms. We discussed options for additional evaluation including diagnostic catheterization.  The patient understands that risks include but are not limited to stroke (1 in 1000), death (1 in 41), kidney failure [usually temporary] (1 in 500), bleeding (1 in 200), allergic reaction [possibly serious] (1 in 200), and agrees to proceed.  Of note, he does have chronic pain in the right radial area due to prior procedure.  He will not likely tolerate TR band on that side post-procedure (pain simply w/ checking pulse today).  Plan on either L radial or R femoral approach. We will check labs today. I have provided him with a prescription for sublingual nitroglycerin. He is scheduled for diagnostic catheterization next Friday. He knows to call [phone] if he has recurrent/recalcitrant chest discomfort. He remains on aspirin, beta blocker, and ARB therapy. He is not currently on statin but given prior history of diabetes, coronary calcification on CT, and probable coronary artery disease, we will likely plan to initiate statin therapy post catheterization.  2. Essential hypertension: Blood pressure is stable today. He brings a list of pressures with him and he does sometimes run low associated with lightheadedness. I have asked him to reduce his hydrochlorothiazide to 12.5 mg daily. He will otherwise remain on beta blocker, ARB, and long-acting nitrate  therapy.  3. Type 2 diabetes mellitus: This is managed by primary care. A1c was 6.2 in July 2017. He will hold metformin on the day of his catheterization.   4. History of dysphagia with GERD/hiatal hernia/esophageal stricture/Mallory-Weiss tear: He is followed closely by gastroenterology.  5. Disposition: Precatheterization labs today. Diagnostic catheterization next week. Follow-up in 2 weeks.   Murray Hodgkins, NP 06/07/2016, 3:34 PM

## 2016-06-14 NOTE — Progress Notes (Signed)
Pts left radial site is clean, dry and intact. VSS, no complaints of pain, pulses positive. I will continue to assess.

## 2016-06-14 NOTE — Progress Notes (Signed)
Pt admitted to room 237 s/p cardiac catherization with stent placement. VSS, no complaints of pain, left radial access site had gauze and transparent dressing which is clean, dry and intact. No hematoma is noted in the area, bilateral radial pulses are present. I will continue to assess.

## 2016-06-14 NOTE — Progress Notes (Signed)
Patient remains clinically stable post stent placement per Dr Fletcher Anon. Dressing now on left wrist with tr band off, no bleeding nor hematoma visible at this time, denies complaints.

## 2016-06-14 NOTE — Plan of Care (Signed)
Problem: Pain Managment: Goal: General experience of comfort will improve Outcome: Progressing Pt has no complaints of pain s/p cardiac catherization.  Problem: Physical Regulation: Goal: Will remain free from infection Outcome: Progressing Pt instructed on how to decrease his risk of infection.   Problem: Cardiovascular: Goal: Vascular access site(s) Level 0-1 will be maintained Outcome: Progressing Pt will be monitored per policy for left radial access site.

## 2016-06-14 NOTE — Discharge Instructions (Signed)

## 2016-06-14 NOTE — Interval H&P Note (Signed)
Cath Lab Visit (complete for each Cath Lab visit)  Clinical Evaluation Leading to the Procedure:   ACS: No.  Non-ACS:    Anginal Classification: CCS III  Anti-ischemic medical therapy: Maximal Therapy (2 or more classes of medications)  Non-Invasive Test Results: Intermediate-risk stress test findings: cardiac mortality 1-3%/year  Prior CABG: No previous CABG      History and Physical Interval Note:  06/14/2016 7:49 AM  Shane Mcknight  has presented today for surgery, with the diagnosis of Left Heart Cath d/t abnormal stress test and Unstable Angina  The various methods of treatment have been discussed with the patient and family. After consideration of risks, benefits and other options for treatment, the patient has consented to  Procedure(s): Left Heart Cath (Left) as a surgical intervention .  [name] history has been reviewed, patient examined, no change in status, stable for surgery.  I have reviewed the patient's chart and labs.  Questions were answered to the patient's satisfaction.     Shane Mcknight

## 2016-06-15 DIAGNOSIS — I208 Other forms of angina pectoris: Secondary | ICD-10-CM

## 2016-06-15 DIAGNOSIS — I2510 Atherosclerotic heart disease of native coronary artery without angina pectoris: Secondary | ICD-10-CM | POA: Diagnosis not present

## 2016-06-15 LAB — CBC
HCT: 34.3 % — ABNORMAL LOW (ref 40.0–52.0)
HEMOGLOBIN: 11.7 g/dL — AB (ref 13.0–18.0)
MCH: 28.4 pg (ref 26.0–34.0)
MCHC: 34.1 g/dL (ref 32.0–36.0)
MCV: 83.2 fL (ref 80.0–100.0)
Platelets: 148 10*3/uL — ABNORMAL LOW (ref 150–440)
RBC: 4.13 MIL/uL — AB (ref 4.40–5.90)
RDW: 14.8 % — ABNORMAL HIGH (ref 11.5–14.5)
WBC: 4 10*3/uL (ref 3.8–10.6)

## 2016-06-15 LAB — BASIC METABOLIC PANEL
ANION GAP: 7 (ref 5–15)
BUN: 10 mg/dL (ref 6–20)
CHLORIDE: 108 mmol/L (ref 101–111)
CO2: 26 mmol/L (ref 22–32)
Calcium: 8.3 mg/dL — ABNORMAL LOW (ref 8.9–10.3)
Creatinine, Ser: 0.85 mg/dL (ref 0.61–1.24)
GFR calc non Af Amer: >60 mL/min (ref >60)
Glucose, Bld: 122 mg/dL — ABNORMAL HIGH (ref 65–99)
POTASSIUM: 3.3 mmol/L — AB (ref 3.5–5.1)
SODIUM: 141 mmol/L (ref 135–145)

## 2016-06-15 LAB — GLUCOSE, CAPILLARY
Glucose-Capillary: 113 mg/dL — ABNORMAL HIGH (ref 65–99)
Glucose-Capillary: 111 mg/dL — ABNORMAL HIGH (ref 65–99)

## 2016-06-15 NOTE — Plan of Care (Signed)
Problem: Safety: Goal: Ability to remain free from injury will improve Outcome: Completed/Met Date Met: 06/15/16 Pt has remained injury free this admission.

## 2016-06-15 NOTE — Progress Notes (Signed)
Pulses positive. Left radial site CD&I. No complaints.

## 2016-06-17 ENCOUNTER — Encounter: Payer: Self-pay | Admitting: Cardiovascular Disease

## 2016-06-18 ENCOUNTER — Encounter: Payer: Self-pay | Admitting: *Deleted

## 2016-06-18 ENCOUNTER — Telehealth: Payer: Self-pay | Admitting: Cardiovascular Disease

## 2016-06-18 NOTE — Telephone Encounter (Signed)
Spoke with patient at length regarding appointment information. He states that while in the hospital he had asked the nurse about this echocardiogram and that it was supposed to be canceled. Reviewed with him that this test is for further evaluation and it is recommended for him to have it done. He also needs note to return to work. Let him know that based on discharge note below I would get this placed up front for him to pick up at his convenience and he was appreciative for the call and had no further questions at this time.    OK to discharge.  May return to work without limitations.  Follow up has been arranged.  Meds reviewed.    Jeneen Rinks [facility]  8:54 AM  06/15/2016

## 2016-06-18 NOTE — Telephone Encounter (Signed)
Pt states he has some questions regarding his procedure done last week, he asks if he needs an echocardiogram scheduled for 6/8. Patient also needs a note to return to work.

## 2016-06-21 ENCOUNTER — Other Ambulatory Visit: Payer: BLUE CROSS/BLUE SHIELD

## 2016-06-25 ENCOUNTER — Ambulatory Visit (INDEPENDENT_AMBULATORY_CARE_PROVIDER_SITE_OTHER): Payer: BLUE CROSS/BLUE SHIELD | Admitting: Cardiovascular Disease

## 2016-06-25 ENCOUNTER — Encounter: Payer: Self-pay | Admitting: Cardiovascular Disease

## 2016-06-25 VITALS — BP 100/60 | HR 62 | Ht 68.5 in | Wt 193.5 lb

## 2016-06-25 DIAGNOSIS — I10 Essential (primary) hypertension: Secondary | ICD-10-CM

## 2016-06-25 DIAGNOSIS — I208 Other forms of angina pectoris: Secondary | ICD-10-CM | POA: Diagnosis not present

## 2016-06-25 DIAGNOSIS — E785 Hyperlipidemia, unspecified: Secondary | ICD-10-CM

## 2016-06-25 MED ORDER — PRASUGREL HCL 10 MG PO TABS: 10.0000 mg | ORAL_TABLET | Freq: Every day | ORAL | 5 refills | Status: DC

## 2016-06-25 NOTE — Progress Notes (Signed)
Cardiology Office Note   Date:  06/25/2016   ID:  Shane Mcknight, DOB [birth year], MRN [id number]  PCP:  Einar Pheasant, MD  Cardiologist:   Kathlyn Sacramento, MD   Chief Complaint  Patient presents with  . other    Follow up s/p cardiac cath & stent placement. Meds reviewed by the pt. verbally. "doing well."      History of Present Illness: Shane Mcknight is a 66 y.o. male who presents for A follow-up visit regarding coronary artery disease. He has known history of hypertension, hyperlipidemia, type 2 diabetes, GERD and Mallory-Weiss tear. He was seen recently for new onset anginal symptoms. He underwent a nuclear stress test which showed evidence of ischemia in the inferior and inferoseptal area with mildly reduced LV systolic function. I proceeded with cardiac catheterization via the left radial artery which showed significant two-vessel coronary artery disease with severe stenosis in the distal right coronary artery. There was subtotal occlusion of OM1 which was small vessel less than 2 mm, 40% ostial left main stenosis and moderate LAD disease. Ejection fraction was normal. I performed successful angioplasty and drug-eluting stent placement to the right coronary artery. Plavix and rosuvastatin were added. He has been doing well and reports resolution of anginal symptoms. However, he complains of generalized itching since hospital discharge. No previous stroke or TIA.  Past Medical History:  Diagnosis Date  . Abnormal liver function tests   . Allergic state   . Arthritis    spine, hands, shoulder with previous cuff tear  . CAD (coronary artery disease)    a.  11/2015 CT Chest/Abd: Coronray Ca2+ noted;  b. MV: EF 45-54%, med defect of mod severity in basal infsept, basal inf, mid infsept, and mid inf region-> infarct and peri-infarct ischemia-->intermediate risk; c. 05/2016 Cath/PCI: LM 40ost, LAD 30p, 30m, D1 60ost, LCX 60ost, OM1 99, RCA 60p, 95d (2.5x15 Resolute Onyx DES), RPDA/RPLB  min irregs, D2 60, EF 55-65%/  . Colon polyp   . DDD (degenerative disc disease), lumbosacral   . Dysphagia   . Elevated transaminase level   . Esophageal stricture    a. s/p multiple dilations - last 03/2016.  . Ganglion cyst of finger of right hand   . GERD (gastroesophageal reflux disease)   . Guaiac positive stools   . Hiatal hernia   . HNP (herniated nucleus pulposus), lumbar   . Hypercholesterolemia   . Hypertension   . Lumbar radiculitis   . Mallory-Weiss tear    a. 11/2015.  . Migraine aura without headache   . Psoriatic arthritis ([place])   . Sleep apnea   . Type II diabetes mellitus ([place])     Past Surgical History:  Procedure Laterality Date  . ARTHROPLASTY     right thumb; left thumb  . BALLOON DILATION N/A 05/29/2015   Procedure: BALLOON DILATION;  Surgeon: Lollie Sails, MD;  Location: [facility] ENDOSCOPY;  Service: Endoscopy;  Laterality: N/A;  . BLEPHAROPLASTY    . CARPAL TUNNEL RELEASE     right  . CHOLECYSTECTOMY    . COLONOSCOPY    . CORONARY STENT INTERVENTION N/A 06/14/2016   Procedure: Coronary Stent Intervention;  Surgeon: Wellington Hampshire, MD;  Location: [place] CV LAB;  Service: Cardiovascular;  Laterality: N/A;  . CYST EXCISION  04/14/2015   tendon sheath cyst excision; right ring finger cyst removed and trigger finger realease  . ESOPHAGOGASTRODUODENOSCOPY    . ESOPHAGOGASTRODUODENOSCOPY (EGD) WITH PROPOFOL N/A 05/29/2015   Procedure: ESOPHAGOGASTRODUODENOSCOPY (  EGD) WITH PROPOFOL;  Surgeon: Lollie Sails, MD;  Location: [facility] ENDOSCOPY;  Service: Endoscopy;  Laterality: N/A;  . ESOPHAGOGASTRODUODENOSCOPY (EGD) WITH PROPOFOL N/A 07/04/2015   Procedure: ESOPHAGOGASTRODUODENOSCOPY (EGD) WITH PROPOFOL;  Surgeon: Lollie Sails, MD;  Location: [facility] ENDOSCOPY;  Service: Endoscopy;  Laterality: N/A;  . ESOPHAGOGASTRODUODENOSCOPY (EGD) WITH PROPOFOL N/A 01/04/2016   Procedure: ESOPHAGOGASTRODUODENOSCOPY (EGD) WITH PROPOFOL;  Surgeon: Lollie Sails,  MD;  Location: [facility] ENDOSCOPY;  Service: Endoscopy;  Laterality: N/A;  . ESOPHAGOGASTRODUODENOSCOPY (EGD) WITH PROPOFOL N/A 03/22/2016   Procedure: ESOPHAGOGASTRODUODENOSCOPY (EGD) WITH PROPOFOL;  Surgeon: Lollie Sails, MD;  Location: [facility] ENDOSCOPY;  Service: Endoscopy;  Laterality: N/A;  . FOREIGN BODY REMOVAL N/A 12/03/2015   Procedure: FOREIGN BODY REMOVAL;  Surgeon: Wilford Corner, MD;  Location: [facility] ENDOSCOPY;  Service: Endoscopy;  Laterality: N/A;  . HERNIA REPAIR    . LEFT HEART CATH Left 06/14/2016   Procedure: Left Heart Cath;  Surgeon: Wellington Hampshire, MD;  Location: [place] CV LAB;  Service: Cardiovascular;  Laterality: Left;  . LUMBAR DISC SURGERY    . NASAL SINUS SURGERY    . ROTATOR CUFF REPAIR    . TONSILLECTOMY       Current Outpatient Prescriptions  Medication Sig Dispense Refill  . aspirin EC 81 MG tablet Take 81 mg by mouth daily.    Marland Kitchen atenolol (TENORMIN) 50 MG tablet Take 1 tablet (50 mg total) by mouth 2 (two) times daily. 60 tablet 7  . clopidogrel (PLAVIX) 75 MG tablet Take 1 tablet (75 mg total) by mouth daily with breakfast. 30 tablet 6  . COSENTYX SENSOREADY PEN 150 MG/ML SOAJ Inject 1 Dose into the skin every 30 (thirty) days. Due 02/40/9735  10  . folic acid (FOLVITE) 1 MG tablet Take 1 mg by mouth daily.    Marland Kitchen gabapentin (NEURONTIN) 300 MG capsule Take 300 mg by mouth 3 (three) times daily as needed.    . hydrochlorothiazide (HYDRODIURIL) 25 MG tablet Take 0.5 tablets (12.5 mg total) by mouth daily. 30 tablet 0  . isosorbide mononitrate (IMDUR) 30 MG 24 hr tablet Take 1 tablet (30 mg total) by mouth daily. (Patient taking differently: Take 30 mg by mouth daily as needed. ) 30 tablet 3  . losartan (COZAAR) 50 MG tablet TAKE 1 TABLET BY MOUTH EVERY DAY 90 tablet 0  . metFORMIN (GLUCOPHAGE) 500 MG tablet TAKE 4 TABLETS BY MOUTH EVERY EVENING. 120 tablet 0  . Multiple Vitamin (MULTIVITAMIN) tablet Take 1 tablet by mouth daily.    . nitroGLYCERIN  (NITROSTAT) 0.4 MG SL tablet Place 1 tablet (0.4 mg total) under the tongue every 5 (five) minutes as needed. 25 tablet 3  . pantoprazole (PROTONIX) 40 MG tablet Take 1 tablet (40 mg total) by mouth 2 (two) times daily. 60 tablet 0  . rosuvastatin (CRESTOR) 20 MG tablet Take 1 tablet (20 mg total) by mouth daily at 6 PM. 30 tablet 6  . saccharomyces boulardii (FLORASTOR) 250 MG capsule Take 250 mg by mouth 2 (two) times daily.    . traMADol (ULTRAM) 50 MG tablet Take 1 tablet (50 mg total) by mouth 2 (two) times daily as needed. 40 tablet 0   No current facility-administered medications for this visit.     Allergies:   Amoxicillin; Ephedrine; Levsin [hyoscyamine sulfate]; Naprosyn [naproxen]; Penicillins; Sudafed [pseudoephedrine hcl]; Vancomycin; Vicodin [hydrocodone-acetaminophen]; Other; and Shellfish allergy    Social History:  The patient  reports that he quit smoking about 33 years ago. He  has never used smokeless tobacco. He reports that he drinks alcohol. He reports that he does not use drugs.   Family History:  [name] family history includes Hypertension in his mother; Lung cancer in his father.    ROS:  Please see the history of present illness.   Otherwise, review of systems are positive for none.   All other systems are reviewed and negative.    PHYSICAL EXAM: VS:  BP 100/60 (BP Location: Left Arm, Patient Position: Sitting, Cuff Size: Normal)   Pulse 62   Ht 5' 8.5" (1.74 m)   Wt 193 lb 8 oz (87.8 kg)   BMI 28.99 kg/m  , BMI Body mass index is 28.99 kg/m. GEN: Well nourished, well developed, in no acute distress  HEENT: normal  Neck: no JVD, carotid bruits, or masses Cardiac: RRR; no murmurs, rubs, or gallops,no edema  Respiratory:  clear to auscultation bilaterally, normal work of breathing GI: soft, nontender, nondistended, + BS MS: no deformity or atrophy  Skin: warm and dry, Mild rash on the back Neuro:  Strength and sensation are intact Psych: euthymic  mood, full affect   EKG:  EKG is ordered today. The ekg ordered today demonstrates normal sinus rhythm with sinus arrhythmia.   Recent Labs: 12/05/2015: Magnesium 1.6 02/06/2016: ALT 19 06/15/2016: BUN 10; Creatinine, Ser 0.85; Hemoglobin 11.7; Platelets 148; Potassium 3.3; Sodium 141    Lipid Panel    Component Value Date/Time   CHOL 162 08/04/2015 0822   TRIG 181 (H) 12/03/2015 1510   HDL 35.90 (L) 08/04/2015 0822   CHOLHDL 5 08/04/2015 0822   VLDL 26.2 08/04/2015 0822   LDLCALC 100 (H) 08/04/2015 0822      Wt Readings from Last 3 Encounters:  06/25/16 193 lb 8 oz (87.8 kg)  06/14/16 190 lb 9.6 oz (86.5 kg)  06/07/16 194 lb (88 kg)      PAD Screen 05/21/2016  Previous PAD dx? No  Previous surgical procedure? No  Pain with walking? No  Feet/toe relief with dangling? No  Painful, non-healing ulcers? No  Extremities discolored? No      ASSESSMENT AND PLAN:  1.  Coronary artery disease involving native coronary arteries with other forms of angina: Symptoms resolved completely after recent angioplasty and drug-eluting stent placement to the distal right coronary artery. He has residual moderate disease for which I recommend aggressive medical therapy. I discussed with him cardiac rehabilitation but he is not able to attend.  I suspect that his itching and mild rash is likely due to clopidogrel. Thus, I discontinued this and switched him to Effient 10 mg once daily. Check CBC in one month given that he has known hemorrhoids.  2. Hyperlipidemia: He was started on rosuvastatin 20 mg daily. Check fasting lipid and liver profile during next visit.  3. Essential hypertension: Blood pressure is controlled.    Disposition:   FU with me in 1 month  Signed,  Kathlyn Sacramento, MD  06/25/2016 3:57 PM    [place]

## 2016-06-25 NOTE — Patient Instructions (Signed)
Medication Instructions:  Your physician has recommended you make the following change in your medication:  STOP taking plavix START taking effient 10mg  once daily   Labwork: CBC, lipid and liver profile in one month  Testing/Procedures: none  Follow-Up: Your physician recommends that you schedule a follow-up appointment in: one month with Dr. Fletcher Anon.    Any Other Special Instructions Will Be Listed Below (If Applicable).     If you need a refill on your cardiac medications before your next appointment, please call your pharmacy.

## 2016-07-05 ENCOUNTER — Telehealth: Payer: Self-pay | Admitting: Internal Medicine

## 2016-07-05 NOTE — Telephone Encounter (Signed)
Patient stated since we do not have appointments today, he was going to go to urgent care.

## 2016-07-05 NOTE — Telephone Encounter (Signed)
Pt called and stated that he is having rt ear irritation. It is between the ear canal and the surrounding ear, says that it is itchy. Please advise, thank you!  Call pt @ [phone]

## 2016-08-01 ENCOUNTER — Ambulatory Visit (INDEPENDENT_AMBULATORY_CARE_PROVIDER_SITE_OTHER): Payer: BLUE CROSS/BLUE SHIELD | Admitting: Nurse Practitioner

## 2016-08-01 ENCOUNTER — Encounter: Payer: Self-pay | Admitting: Nurse Practitioner

## 2016-08-01 VITALS — BP 110/68 | HR 73 | Ht 68.5 in | Wt 197.8 lb

## 2016-08-01 DIAGNOSIS — I10 Essential (primary) hypertension: Secondary | ICD-10-CM

## 2016-08-01 DIAGNOSIS — I2510 Atherosclerotic heart disease of native coronary artery without angina pectoris: Secondary | ICD-10-CM | POA: Diagnosis not present

## 2016-08-01 DIAGNOSIS — E785 Hyperlipidemia, unspecified: Secondary | ICD-10-CM | POA: Diagnosis not present

## 2016-08-01 NOTE — Progress Notes (Signed)
Office Visit    Patient Name: Shane Mcknight Date of Encounter: 08/01/2016  Primary Care Provider:  Einar Pheasant, MD Primary Cardiologist:  Jerilynn Mages. Fletcher Anon, MD   Chief Complaint    66 year old male with a history of CAD status post recent RCA stenting, hypertension, hyperlipidemia, diabetes, sleep apnea, GERD, hiatal hernia, and esophageal stricture, who presents for follow-up.  Past Medical History    Past Medical History:  Diagnosis Date  . Abnormal liver function tests   . Allergic state   . Arthritis    spine, hands, shoulder with previous cuff tear  . CAD (coronary artery disease)    a.  11/2015 CT Chest/Abd: Coronray Ca2+ noted;  b. MV: EF 45-54%, med defect of mod severity in basal infsept, basal inf, mid infsept, and mid inf region-> infarct and peri-infarct ischemia-->intermediate risk; c. 05/2016 Cath/PCI: LM 40ost, LAD 30p, 6m, D1 60ost, LCX 60ost, OM1 99 (small->Med Rx), RCA 60p, 95d (2.5x15 Resolute Onyx DES), RPDA/RPLB min irregs, D2 60, EF 55-65%/  . Colon polyp   . DDD (degenerative disc disease), lumbosacral   . Dysphagia   . Elevated transaminase level   . Esophageal stricture    a. s/p multiple dilations - last 03/2016.  . Ganglion cyst of finger of right hand   . GERD (gastroesophageal reflux disease)   . Guaiac positive stools   . Hiatal hernia   . HNP (herniated nucleus pulposus), lumbar   . Hypercholesterolemia   . Hypertension   . Lumbar radiculitis   . Mallory-Weiss tear    a. 11/2015.  . Migraine aura without headache   . Psoriatic arthritis ([place])   . Sleep apnea   . Type II diabetes mellitus ([place])    Past Surgical History:  Procedure Laterality Date  . ARTHROPLASTY     right thumb; left thumb  . BALLOON DILATION N/A 05/29/2015   Procedure: BALLOON DILATION;  Surgeon: Lollie Sails, MD;  Location: [facility] ENDOSCOPY;  Service: Endoscopy;  Laterality: N/A;  . BLEPHAROPLASTY    . CARPAL TUNNEL RELEASE     right  . CHOLECYSTECTOMY    .  COLONOSCOPY    . CORONARY STENT INTERVENTION N/A 06/14/2016   Procedure: Coronary Stent Intervention;  Surgeon: Wellington Hampshire, MD;  Location: [place] [facility] LAB;  Service: Cardiovascular;  Laterality: N/A;  . CYST EXCISION  04/14/2015   tendon sheath cyst excision; right ring finger cyst removed and trigger finger realease  . ESOPHAGOGASTRODUODENOSCOPY    . ESOPHAGOGASTRODUODENOSCOPY (EGD) WITH PROPOFOL N/A 05/29/2015   Procedure: ESOPHAGOGASTRODUODENOSCOPY (EGD) WITH PROPOFOL;  Surgeon: Lollie Sails, MD;  Location: [facility] ENDOSCOPY;  Service: Endoscopy;  Laterality: N/A;  . ESOPHAGOGASTRODUODENOSCOPY (EGD) WITH PROPOFOL N/A 07/04/2015   Procedure: ESOPHAGOGASTRODUODENOSCOPY (EGD) WITH PROPOFOL;  Surgeon: Lollie Sails, MD;  Location: [facility] ENDOSCOPY;  Service: Endoscopy;  Laterality: N/A;  . ESOPHAGOGASTRODUODENOSCOPY (EGD) WITH PROPOFOL N/A 01/04/2016   Procedure: ESOPHAGOGASTRODUODENOSCOPY (EGD) WITH PROPOFOL;  Surgeon: Lollie Sails, MD;  Location: [facility] ENDOSCOPY;  Service: Endoscopy;  Laterality: N/A;  . ESOPHAGOGASTRODUODENOSCOPY (EGD) WITH PROPOFOL N/A 03/22/2016   Procedure: ESOPHAGOGASTRODUODENOSCOPY (EGD) WITH PROPOFOL;  Surgeon: Lollie Sails, MD;  Location: [facility] ENDOSCOPY;  Service: Endoscopy;  Laterality: N/A;  . FOREIGN BODY REMOVAL N/A 12/03/2015   Procedure: FOREIGN BODY REMOVAL;  Surgeon: Wilford Corner, MD;  Location: [facility] ENDOSCOPY;  Service: Endoscopy;  Laterality: N/A;  . HERNIA REPAIR    . LEFT HEART CATH Left 06/14/2016   Procedure: Left Heart Cath;  Surgeon: Fletcher Anon,  Mertie Clause, MD;  Location: [place] [facility] LAB;  Service: Cardiovascular;  Laterality: Left;  . LUMBAR DISC SURGERY    . NASAL SINUS SURGERY    . ROTATOR CUFF REPAIR    . TONSILLECTOMY      Allergies  Allergies  Allergen Reactions  . Amoxicillin   . Ephedrine Other (See Comments)    pain  . Levsin [Hyoscyamine Sulfate] Other (See Comments)    Urinary retention  . Naprosyn [Naproxen]  Other (See Comments)    GI upset   . Penicillins   . Sudafed [Pseudoephedrine Hcl]   . Vancomycin   . Vicodin [Hydrocodone-Acetaminophen]     Other reaction(s): Other (See Comments) GI Upset  . Other Rash    Paper tape  . Shellfish Allergy Rash    History of Present Illness    66 year old male with the above complex past medical history including CAD status post catheterization following abnormal stress test in May 2018, revealing severe RCA disease. This was successfully treated with a drug-eluting stent. Patient also has severe obtuse marginal disease, however this is a small vessel and is being medically managed. Other history includes hypertension, hyperlipidemia, diabetes, sleep apnea, GERD, hiatal hernia, and esophageal stricture status post dilatations.  He was last seen in clinic about one month ago, when he was experiencing itching and his Plavix was switched to Effient. Since then, itching has more or less resolved. He has not had any rash. He has noticed increased frequency of headaches over the past month. He does have a history of migraines. He was wondering if maybe one of his medicines may be playing a role. Headaches are often associated with neck shoulder and upper back discomfort and he would like a holiday from Crestor to see if symptoms improve.  From a cardiac standpoint, he has done well without chest pain or dyspnea. He denies PND, orthopnea, dizziness, syncope, edema or palpitations, or early satiety.  Home Medications    Prior to Admission medications   Medication Sig Start Date End Date Taking? Authorizing Provider  aspirin EC 81 MG tablet Take 81 mg by mouth daily.   Yes [provider]  atenolol (TENORMIN) 50 MG tablet Take 1 tablet (50 mg total) by mouth 2 (two) times daily. 01/10/16  Yes Scott, Randell Patient, MD  COSENTYX SENSOREADY PEN 150 MG/ML SOAJ Inject 1 Dose into the skin every 30 (thirty) days. Due 12/16/2015 04/06/15  Yes [provider]    folic acid (FOLVITE) 1 MG tablet Take 1 mg by mouth daily.   Yes [provider]  gabapentin (NEURONTIN) 300 MG capsule Take 300 mg by mouth 3 (three) times daily as needed. 05/14/13  Yes Einar Pheasant, MD  hydrochlorothiazide (HYDRODIURIL) 25 MG tablet Take 0.5 tablets (12.5 mg total) by mouth daily. 06/07/16  Yes Rogelia Mire, NP  isosorbide mononitrate (IMDUR) 30 MG 24 hr tablet Take 1 tablet (30 mg total) by mouth daily. Patient taking differently: Take 30 mg by mouth daily as needed.  06/05/16 09/03/16 Yes End, Harrell Gave, MD  losartan (COZAAR) 50 MG tablet TAKE 1 TABLET BY MOUTH EVERY DAY 05/17/16  Yes Einar Pheasant, MD  metFORMIN (GLUCOPHAGE) 500 MG tablet TAKE 4 TABLETS BY MOUTH EVERY EVENING. 06/03/16  Yes Einar Pheasant, MD  Multiple Vitamin (MULTIVITAMIN) tablet Take 1 tablet by mouth daily.   Yes [provider]  nitroGLYCERIN (NITROSTAT) 0.4 MG SL tablet Place 1 tablet (0.4 mg total) under the tongue every 5 (five) minutes as needed. 06/07/16  Yes Rogelia Mire, NP  pantoprazole (PROTONIX) 40 MG tablet Take 1 tablet (40 mg total) by mouth 2 (two) times daily. 12/08/15  Yes Dustin Flock, MD  prasugrel (EFFIENT) 10 MG TABS tablet Take 1 tablet (10 mg total) by mouth daily. 06/25/16 09/23/16 Yes Wellington Hampshire, MD  rosuvastatin (CRESTOR) 20 MG tablet Take 1 tablet (20 mg total) by mouth daily at 6 PM. 06/14/16  Yes Rogelia Mire, NP  saccharomyces boulardii (FLORASTOR) 250 MG capsule Take 250 mg by mouth 2 (two) times daily.   Yes [provider]  traMADol (ULTRAM) 50 MG tablet Take 1 tablet (50 mg total) by mouth 2 (two) times daily as needed. 08/11/15  Yes Einar Pheasant, MD    Review of Systems    As above, he has been doing well from a cardiac standpoint without chest pain, palpitations, dyspnea, PND, orthopnea, dizziness, syncope, edema, or early satiety. He does experience headaches and has a history of migraines.  All other systems  reviewed and are otherwise negative except as noted above.  Physical Exam    VS:  BP 110/68 (BP Location: Left Arm, Patient Position: Sitting, Cuff Size: Normal)   Pulse 73   Ht 5' 8.5" (1.74 m)   Wt 197 lb 12 oz (89.7 kg)   BMI 29.63 kg/m  , BMI Body mass index is 29.63 kg/m. GEN: Well nourished, well developed, in no acute distress.  HEENT: normal.  Neck: Supple, no JVD, carotid bruits, or masses. Cardiac: RRR, no murmurs, rubs, or gallops. No clubbing, cyanosis, edema.  Radials/DP/PT 2+ and equal bilaterally.  Respiratory:  Respirations regular and unlabored, clear to auscultation bilaterally. GI: Soft, nontender, nondistended, BS + x 4. MS: no deformity or atrophy. Skin: warm and dry, no rash. Neuro:  Strength and sensation are intact. Psych: Normal affect.  Accessory Clinical Findings    None  Assessment & Plan    1.  Coronary artery disease: Status post PCI and drug eluting stent placement to the right coronary artery in May. He does have residual small vessel obtuse marginal disease which is being medically managed. He has done well without chest pain or dyspnea. He is no longer having any significant itching since switching from Plavix to Effient. Of note, he is no longer taking long-acting nitrate therapy. He otherwise remains on aspirin, beta blocker, ARB, and statin therapy.  2. Essential hypertension: Blood pressure is stable on beta blocker, diuretic, and ARB.  3. Hyperlipidemia: He was placed on Crestor therapy in May. I'm going to arrange for fasting lipids drawn tomorrow morning.  4. Type 2 diabetes mellitus: This is followed by primary care patient remains on metformin therapy.  5. Headaches: Patient has a history of migraines and has noted increased in frequency that seem to be associated with upper back, shoulder, and neck discomfort. The symptoms increased since Crestor therapy was initiated, he would like to take her Crestor holiday to see if this changes  things. I advised that would be okay that he should notify us if symptoms improve and if we need to switch him to an alternate statin.  6. GERD/hiatal hernia: Symptoms have been stable. He remains on PPI therapy.  7. Disposition: Patient will follow-up with Dr. Fletcher Anon in 4-6 months or sooner if necessary.   Murray Hodgkins, NP 08/01/2016, 4:03 PM

## 2016-08-01 NOTE — Patient Instructions (Addendum)
Labwork: Your physician recommends that you return for lab work. Make sure not to eat or drink after midnight then go to [place] and check in at the first desk on the right.   Follow-Up: Your physician recommends that you schedule a follow-up appointment in: 4-6 months with Dr. Fletcher Shane Mcknight  It was a pleasure seeing you today here in the office. Please do not hesitate to give [place] a call back if you have any further questions. [place], BSN      Cholesterol Cholesterol is a fat. Your body needs a small amount of cholesterol. Cholesterol (plaque) may build up in your blood vessels (arteries). That makes you more likely to have a heart attack or stroke. You cannot feel your cholesterol level. Having a blood test is the only way to find out if your level is high. Keep your test results. Work with your doctor to keep your cholesterol at a good level. What do the results mean?  Total cholesterol is how much cholesterol is in your blood.  LDL is bad cholesterol. This is the type that can build up. Try to have low LDL.  HDL is good cholesterol. It cleans your blood vessels and carries LDL away. Try to have high HDL.  Triglycerides are fat that the body can store or burn for energy. What are good levels of cholesterol?  Total cholesterol below 200.  LDL below 100 is good for people who have health risks. LDL below 70 is good for people who have very high risks.  HDL above 40 is good. It is best to have HDL of 60 or higher.  Triglycerides below 150. How can I lower my cholesterol? Diet Follow your diet program as told by your doctor.  Choose fish, white meat chicken, or [place] that is roasted or baked. Try not to eat red meat, fried foods, sausage, or lunch meats.  Eat lots of fresh fruits and vegetables.  Choose whole grains, beans, pasta, potatoes, and cereals.  Choose olive oil, corn oil, or canola oil. Only use small amounts.  Try not to eat  butter, mayonnaise, shortening, or palm kernel oils.  Try not to eat foods with trans fats.  Choose low-fat or nonfat dairy foods. ? Drink skim or nonfat milk. ? Eat low-fat or nonfat yogurt and cheeses. ? Try not to drink whole milk or cream. ? Try not to eat ice cream, egg yolks, or full-fat cheeses.  Healthy desserts include angel food cake, ginger snaps, animal crackers, hard candy, popsicles, and low-fat or nonfat frozen yogurt. Try not to eat pastries, cakes, pies, and cookies.  Exercise Follow your exercise program as told by your doctor.  Be more active. Try gardening, walking, and taking the stairs.  Ask your doctor about ways that you can be more active.  Medicine  Take over-the-counter and prescription medicines only as told by your doctor.  This information is not intended to replace advice given to you by your health care provider. Make sure you discuss any questions you have with your health care provider. Document Released: 03/29/2008 Document Revised: 08/02/2015 Document Reviewed: 07/13/2015 Elsevier Interactive Patient Education  2017 [place] Profile Test Why am I having this test? The lipid profile test gives results that can help predict the likelihood of developing heart disease. The test is also used to monitor treatment for high cholesterol to see if you are reaching your goals. A lipid profile measures the following:  Total cholesterol.  Cholesterol is a waxy fat in your blood. If your total cholesterol is elevated, this can increase your risk of coronary heart disease.  High-density lipoprotein (HDL). This is known as the good cholesterol. Having a high level of HDL is good. Your HDL level may be low if you smoke or do not get enough exercise.  Low-density lipoprotein (LDL). This is known as the bad cholesterol and is responsible for the formation of plaque in the arteries. Having a low level of LDL is best.  Cholesterol to HDL ratio. This is  calculated by dividing the total cholesterol by the HDL cholesterol. The ratio is used by health care providers for determining your risk of heart disease. A low ratio is best.  Triglycerides. These are a type of fat in the blood responsible for providing energy to your cells. Low levels are best.  What kind of sample is taken? A blood sample is required for this test. It is usually collected by inserting a needle into a vein. How do I prepare for this test? Do not eat or drink anything after midnight on the night before the test or as directed by your health care provider. What are the reference ranges? Reference ranges are considered healthy ranges established after testing a large group of healthy people. Reference ranges may vary among different people, labs, and hospitals. It is your responsibility to obtain your test results. Ask the lab or department performing the test when and how you will get your results. Reference ranges for the lipid profile test are as follows: Total Cholesterol  Adult or elderly: less than 200 mg/dL or less than 5.20 mmol/L (SI units).  Child: 120-200 mg/dL.  Infant: 70-175 mg/dL.  Newborns: 53-135 mg/dL. HDL  Male: greater than 45 mg/dL or greater than 0.75 mmol/L (SI units).  Male: greater than 55 mg/dL or greater than 0.91 mmol/L (SI units). HDL reference values based on risk of heart disease:  For low risk of heart disease: ? Male: 60 mg/dL or 1.55 mmol/L. ? Male: 70 mg/dL or 1.81 mmol/L.  For moderate risk of heart disease: ? Male: 45 mg/dL or 1.17 mmol/L. ? Male: 55 mg/dL or 1.42 mmol/L.  For high risk of heart disease: ? Male: 25 mg/dL or 0.65 mmol/L. ? Male: 35 mg/dL or 0.90 mmol/L.  LDL  Adult: less than 130 mg/dL.  Children: less than 110 mg/dL. Cholesterol to HDL Ratio Reference values based on risk for coronary heart disease:  Risk that is one half average: ? Male: 3.4. ? Male: 3.3.  Average risk: ? Male:  5.0. ? Male: 4.4.  Risk that is two times average (moderate risk): ? Male: 10.0. ? Male: 7.0.  Risk that is three times average (high risk): ? Male: 24.0. ? Male: 11.0.  Triglycerides  Adult or elderly: ? Male: 40-160 mg/dL or 0.45-1.81 mmol/L (SI units). ? Male: 35-135 mg/dL or 0.40-1.52 mmol/L (SI units).  Children 40-45 years old: ? Male: 30-86 mg/dL. ? Male: 32-99 mg/dL.  Children 70-83 years old: ? Male: 31-108 mg/dL. ? Male: 35-114 mg/dL.  Children 39-67 years old: ? Male: 36-138 mg/dL. ? Male: 41-138 mg/dL.  Children 45-52 years old: ? Male: 40-163 mg/dL. ? Male: 40-128 mg/dL. Triglycerides should be less than 400 mg/dL even when you are not fasting. What do the results mean? Talk with your health care provider to discuss your results, treatment options, and if necessary, the need for more tests. Talk with your health care provider if you have any questions  about your results. Talk with your health care provider to discuss your results, treatment options, and if necessary, the need for more tests. Talk with your health care provider if you have any questions about your results. This information is not intended to replace advice given to you by your health care provider. Make sure you discuss any questions you have with your health care provider. Document Released: 01/25/2004 Document Revised: 09/06/2015 Document Reviewed: 04/22/2013 Elsevier Interactive Patient Education  [employer].

## 2016-08-02 ENCOUNTER — Other Ambulatory Visit
Admission: RE | Admit: 2016-08-02 | Discharge: 2016-08-02 | Disposition: A | Discharge status: Home / Self Care | Payer: BLUE CROSS/BLUE SHIELD | Source: Ambulatory Visit | Attending: Nurse Practitioner | Admitting: Nurse Practitioner

## 2016-08-02 DIAGNOSIS — I2510 Atherosclerotic heart disease of native coronary artery without angina pectoris: Secondary | ICD-10-CM | POA: Insufficient documentation

## 2016-08-02 DIAGNOSIS — I10 Essential (primary) hypertension: Secondary | ICD-10-CM | POA: Insufficient documentation

## 2016-08-02 LAB — LIPID PANEL
Cholesterol: 102 mg/dL (ref 0–200)
HDL: 31 mg/dL — ABNORMAL LOW (ref >40)
LDL CALC: 48 mg/dL (ref 0–99)
TRIGLYCERIDES: 117 mg/dL (ref <150)
Total CHOL/HDL Ratio: 3.3 RATIO
VLDL: 23 mg/dL (ref 0–40)

## 2016-08-19 ENCOUNTER — Other Ambulatory Visit: Payer: Self-pay | Admitting: Internal Medicine

## 2016-08-19 NOTE — Telephone Encounter (Signed)
Refilled: 05/17/2016 Last OV: 02/20/2016 Next OV: not scheduled Was supposed to follow up in April and did not.

## 2016-08-19 NOTE — Telephone Encounter (Signed)
Need to schedule f/u appt.  Once scheduled, then can refill until appt

## 2016-08-24 ENCOUNTER — Other Ambulatory Visit: Payer: Self-pay | Admitting: Internal Medicine

## 2016-08-24 ENCOUNTER — Encounter: Payer: Self-pay | Admitting: Internal Medicine

## 2016-08-26 ENCOUNTER — Other Ambulatory Visit: Payer: Self-pay

## 2016-08-26 MED ORDER — LOSARTAN POTASSIUM 50 MG PO TABS: 50.0000 mg | ORAL_TABLET | Freq: Every day | ORAL | 0 refills | Status: DC

## 2016-08-30 ENCOUNTER — Other Ambulatory Visit: Payer: Self-pay | Admitting: Internal Medicine

## 2016-09-06 NOTE — Telephone Encounter (Signed)
App made pt informed

## 2016-09-11 ENCOUNTER — Other Ambulatory Visit: Payer: Self-pay | Admitting: Gastroenterology

## 2016-09-11 DIAGNOSIS — R1312 Dysphagia, oropharyngeal phase: Secondary | ICD-10-CM

## 2016-09-27 ENCOUNTER — Ambulatory Visit
Admission: RE | Admit: 2016-09-27 | Discharge: 2016-09-27 | Disposition: A | Discharge status: Home / Self Care | Payer: BLUE CROSS/BLUE SHIELD | Source: Ambulatory Visit | Attending: Gastroenterology | Admitting: Gastroenterology

## 2016-09-27 DIAGNOSIS — R1312 Dysphagia, oropharyngeal phase: Secondary | ICD-10-CM | POA: Insufficient documentation

## 2016-09-27 NOTE — Therapy (Signed)
[facility] [place], [place], [address] Phone: [phone]   Fax:     Modified Barium Swallow  Patient Details  Name: Shane Mcknight MRN: [id number] Date of Birth: 1951-01-08 No Data Recorded  Encounter Date: 09/27/2016      End of Session - 09/27/16 1342    Visit Number 1   Number of Visits 1   Date for SLP Re-Evaluation 09/27/16   SLP Start Time 36   SLP Stop Time  1332   SLP Time Calculation (min) 52 min   Activity Tolerance Patient tolerated treatment well      Past Medical History:  Diagnosis Date  . Abnormal liver function tests   . Allergic state   . Arthritis    spine, hands, shoulder with previous cuff tear  . CAD (coronary artery disease)    a.  11/2015 CT Chest/Abd: Coronray Ca2+ noted;  b. MV: EF 45-54%, med defect of mod severity in basal infsept, basal inf, mid infsept, and mid inf region-> infarct and peri-infarct ischemia-->intermediate risk; c. 05/2016 Cath/PCI: LM 40ost, LAD 30p, 64m, D1 60ost, LCX 60ost, OM1 99 (small->Med Rx), RCA 60p, 95d (2.5x15 Resolute Onyx DES), RPDA/RPLB min irregs, D2 60, EF 55-65%/  . Colon polyp   . DDD (degenerative disc disease), lumbosacral   . Dysphagia   . Elevated transaminase level   . Esophageal stricture    a. s/p multiple dilations - last 03/2016.  . Ganglion cyst of finger of right hand   . GERD (gastroesophageal reflux disease)   . Guaiac positive stools   . Hiatal hernia   . HNP (herniated nucleus pulposus), lumbar   . Hypercholesterolemia   . Hypertension   . Lumbar radiculitis   . Mallory-Weiss tear    a. 11/2015.  . Migraine aura without headache   . Psoriatic arthritis ([place])   . Sleep apnea   . Type II diabetes mellitus ([place])     Past Surgical History:  Procedure Laterality Date  . ARTHROPLASTY     right thumb; left thumb  . BALLOON DILATION N/A 05/29/2015   Procedure: BALLOON DILATION;  Surgeon: Lollie Sails, MD;   Location: [facility] ENDOSCOPY;  Service: Endoscopy;  Laterality: N/A;  . BLEPHAROPLASTY    . CARPAL TUNNEL RELEASE     right  . CHOLECYSTECTOMY    . COLONOSCOPY    . CORONARY STENT INTERVENTION N/A 06/14/2016   Procedure: Coronary Stent Intervention;  Surgeon: Wellington Hampshire, MD;  Location: [facility] LAB;  Service: Cardiovascular;  Laterality: N/A;  . CYST EXCISION  04/14/2015   tendon sheath cyst excision; right ring finger cyst removed and trigger finger realease  . ESOPHAGOGASTRODUODENOSCOPY    . ESOPHAGOGASTRODUODENOSCOPY (EGD) WITH PROPOFOL N/A 05/29/2015   Procedure: ESOPHAGOGASTRODUODENOSCOPY (EGD) WITH PROPOFOL;  Surgeon: Lollie Sails, MD;  Location: [facility] ENDOSCOPY;  Service: Endoscopy;  Laterality: N/A;  . ESOPHAGOGASTRODUODENOSCOPY (EGD) WITH PROPOFOL N/A 07/04/2015   Procedure: ESOPHAGOGASTRODUODENOSCOPY (EGD) WITH PROPOFOL;  Surgeon: Lollie Sails, MD;  Location: [facility] ENDOSCOPY;  Service: Endoscopy;  Laterality: N/A;  . ESOPHAGOGASTRODUODENOSCOPY (EGD) WITH PROPOFOL N/A 01/04/2016   Procedure: ESOPHAGOGASTRODUODENOSCOPY (EGD) WITH PROPOFOL;  Surgeon: Lollie Sails, MD;  Location: [facility] ENDOSCOPY;  Service: Endoscopy;  Laterality: N/A;  . ESOPHAGOGASTRODUODENOSCOPY (EGD) WITH PROPOFOL N/A 03/22/2016   Procedure: ESOPHAGOGASTRODUODENOSCOPY (EGD) WITH PROPOFOL;  Surgeon: Lollie Sails, MD;  Location: [facility] ENDOSCOPY;  Service: Endoscopy;  Laterality: N/A;  . FOREIGN BODY REMOVAL N/A 12/03/2015  Procedure: FOREIGN BODY REMOVAL;  Surgeon: Wilford Corner, MD;  Location: [facility] ENDOSCOPY;  Service: Endoscopy;  Laterality: N/A;  . HERNIA REPAIR    . LEFT HEART CATH Left 06/14/2016   Procedure: Left Heart Cath;  Surgeon: Wellington Hampshire, MD;  Location: [facility] LAB;  Service: Cardiovascular;  Laterality: Left;  . LUMBAR DISC SURGERY    . NASAL SINUS SURGERY    . ROTATOR CUFF REPAIR    . TONSILLECTOMY      There were no vitals filed for this visit.    Subjective: Patient behavior: (alertness, ability to follow instructions, etc.): the patient is able to verbalize his swallowing history and follow directions.  Chief complaint: the patient reports pressure in his throat while eating and drinking.  He denies sensation of aspiration.   Objective:  Radiological Procedure: A videoflouroscopic evaluation of oral-preparatory, reflex initiation, and pharyngeal phases of the swallow was performed; as well as a screening of the upper esophageal phase.  I. POSTURE: Upright in MBS chair  II. VIEW: Lateral  III. COMPENSATORY STRATEGIES: N/A  IV. BOLUSES ADMINISTERED:   Thin Liquid: 1 small, 3 rapid consecutive   Nectar-thick Liquid: 1 moderate sip   Honey-thick Liquid: N/A   Puree: 1 teaspoon presentation   Mechanical Soft: 1/4 graham cracker in applesauce   Barium tablet:  V. RESULTS OF EVALUATION: A. ORAL PREPARATORY PHASE: (The lips, tongue, and velum are observed for strength and coordination)       **Overall Severity Rating: WNL  B. SWALLOW INITIATION/REFLEX: (The reflex is normal if "triggered" by the time the bolus reached the base of the tongue)  **Overall Severity Rating: WNL  C. PHARYNGEAL PHASE: (Pharyngeal function is normal if the bolus shows rapid, smooth, and continuous transit through the pharynx and there is no pharyngeal residue after the swallow)  **Overall Severity Rating: WNL  D. LARYNGEAL PENETRATION: (Material entering into the laryngeal inlet/vestibule but not aspirated) None  E. ASPIRATION: None  F. ESOPHAGEAL PHASE: (Screening of the upper esophagus) In the cervical esophagus there is a finger-like protrusion along the posterior [employer] during swallow (does not impede flow of boluses) consistent with prominent cricopharyngeus.    ASSESSMENT: This 66 year old man; with concern for pressure in the upper throat during eating/drinking; is presenting with normal oropharyngeal swallowing.  Oral control of the bolus  including oral hold, rotary mastication, and anterior to posterior transfer are within normal limits. Timing of the pharyngeal swallow is within normal limits.  Aspects of the pharyngeal stage of swallowing including tongue base retraction, hyolaryngeal excursion, epiglottic inversion, and duration/amplitude of UES opening are within normal limits.  A 12.5 mm barium tablet moved directly through the pharynx and UES.  There is no observed pharyngeal residue, laryngeal penetration, or tracheal aspiration.  In the cervical esophagus there is a finger-like protrusion along the posterior [place] during swallow (does not impede flow of boluses) consistent with prominent cricopharyngeus.  [name] experience with pressure does not appear to be due to oropharyngeal swallow function.  The patient may benefit from referral to ENT for visualization of hypopharynx and larynx.  PLAN/RECOMMENDATIONS:   A. Diet: Regular   B. Swallowing Precautions: No special precautions recommended    C. Recommended consultation to ENT   D. Therapy recommendations: ST is not indicated   E. Results and recommendations were discussed with the patient immediately following the study and the final report routed to the referring practitioner.        Oropharyngeal dysphagia - Plan:  DG Swallowing Func-Speech Pathology, DG Swallowing Func-Speech Pathology        Problem List Patient Active Problem List   Diagnosis Date Noted  . CAD (coronary artery disease) 06/14/2016  . Hyperlipidemia 06/14/2016  . Abnormal stress test   . 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 Chest tightness 05/17/2016  . Cough 02/11/2016  . Dizziness 02/11/2016  . Food impaction of esophagus 12/03/2015  . Dysphagia 12/03/2015  . Left shoulder pain 08/13/2015  . Anemia 08/13/2015  . Right groin pain 04/15/2015  . Health care maintenance 07/10/2014  . History of colonic polyps 09/10/2013  . Elevated PSA measurement 04/25/2013  . Skin lesion 04/12/2013  .  Rectal bleeding 04/12/2013  . Obstructive sleep apnea 03/16/2012  . Diabetes mellitus ([place]) 01/30/2012  . Hypertension 01/30/2012  . Psoriatic arthritis ([place]) 01/30/2012  . Abnormal liver function tests 01/30/2012  . GERD (gastroesophageal reflux disease) 01/30/2012   Leroy Sea, MS/CCC- SLP  Lou Miner 09/27/2016, 1:43 PM  [facility] [place], [place], [address] Phone: [phone]   Fax:     Name: SAXON BARICH MRN: [id number] Date of Birth: [birth year]

## 2016-09-29 ENCOUNTER — Other Ambulatory Visit: Payer: Self-pay | Admitting: Internal Medicine

## 2016-10-02 ENCOUNTER — Encounter: Payer: Self-pay | Admitting: Internal Medicine

## 2016-10-02 NOTE — Telephone Encounter (Signed)
If persistent rash, he needs to be evaluated.  I think he sees dermatology.  Does he want [place] to make appt with dermatology.  May save two appts.  If not, then can someone see him here.

## 2016-10-04 ENCOUNTER — Other Ambulatory Visit: Payer: Self-pay | Admitting: Internal Medicine

## 2016-10-11 ENCOUNTER — Telehealth: Payer: Self-pay | Admitting: Internal Medicine

## 2016-10-11 DIAGNOSIS — E0822 Diabetes mellitus due to underlying condition with diabetic chronic kidney disease: Secondary | ICD-10-CM

## 2016-10-11 DIAGNOSIS — I10 Essential (primary) hypertension: Secondary | ICD-10-CM

## 2016-10-11 DIAGNOSIS — N183 Chronic kidney disease, stage 3 (moderate): Principal | ICD-10-CM

## 2016-10-11 NOTE — Telephone Encounter (Signed)
Ok to add a1c and met b.   Thanks

## 2016-10-11 NOTE — Telephone Encounter (Signed)
OK to add A1c [facility] quality Metric missing is A1c can I add for appointment scheduled 11/15/16?

## 2016-10-11 NOTE — Telephone Encounter (Signed)
Labs in

## 2016-10-24 ENCOUNTER — Other Ambulatory Visit: Payer: Self-pay | Admitting: Internal Medicine

## 2016-11-08 ENCOUNTER — Other Ambulatory Visit: Payer: Self-pay | Admitting: Internal Medicine

## 2016-11-15 ENCOUNTER — Ambulatory Visit (INDEPENDENT_AMBULATORY_CARE_PROVIDER_SITE_OTHER): Payer: BLUE CROSS/BLUE SHIELD | Admitting: Internal Medicine

## 2016-11-15 ENCOUNTER — Encounter: Payer: Self-pay | Admitting: Internal Medicine

## 2016-11-15 VITALS — BP 118/66 | HR 64 | Temp 98.6°F | Resp 14 | Ht 69.0 in | Wt 192.4 lb

## 2016-11-15 DIAGNOSIS — R7989 Other specified abnormal findings of blood chemistry: Secondary | ICD-10-CM

## 2016-11-15 DIAGNOSIS — E0822 Diabetes mellitus due to underlying condition with diabetic chronic kidney disease: Secondary | ICD-10-CM | POA: Diagnosis not present

## 2016-11-15 DIAGNOSIS — Z86010 Personal history of colonic polyps: Secondary | ICD-10-CM

## 2016-11-15 DIAGNOSIS — I2510 Atherosclerotic heart disease of native coronary artery without angina pectoris: Secondary | ICD-10-CM | POA: Diagnosis not present

## 2016-11-15 DIAGNOSIS — L989 Disorder of the skin and subcutaneous tissue, unspecified: Secondary | ICD-10-CM | POA: Diagnosis not present

## 2016-11-15 DIAGNOSIS — N183 Chronic kidney disease, stage 3 unspecified: Secondary | ICD-10-CM

## 2016-11-15 DIAGNOSIS — K219 Gastro-esophageal reflux disease without esophagitis: Secondary | ICD-10-CM

## 2016-11-15 DIAGNOSIS — R1310 Dysphagia, unspecified: Secondary | ICD-10-CM

## 2016-11-15 DIAGNOSIS — R945 Abnormal results of liver function studies: Secondary | ICD-10-CM | POA: Diagnosis not present

## 2016-11-15 DIAGNOSIS — R1319 Other dysphagia: Secondary | ICD-10-CM

## 2016-11-15 DIAGNOSIS — E785 Hyperlipidemia, unspecified: Secondary | ICD-10-CM

## 2016-11-15 DIAGNOSIS — D649 Anemia, unspecified: Secondary | ICD-10-CM

## 2016-11-15 DIAGNOSIS — L4050 Arthropathic psoriasis, unspecified: Secondary | ICD-10-CM

## 2016-11-15 DIAGNOSIS — I10 Essential (primary) hypertension: Secondary | ICD-10-CM | POA: Diagnosis not present

## 2016-11-15 DIAGNOSIS — Z1159 Encounter for screening for other viral diseases: Secondary | ICD-10-CM

## 2016-11-15 NOTE — Progress Notes (Signed)
Patient ID: Shane Mcknight, male   DOB: 07/03/1950, 66 y.o.   MRN: [id number]   Subjective:    Patient ID: Shane Mcknight, male    DOB: 08/30/1950, 66 y.o.   MRN: [id number]  HPI  Patient here for a scheduled follow up.  Recently had cardiac cath that revealed severe RCA disease.  S/p RCA stenting.  Had possible allergic rxn to plavix.  Now on Effient.  Doing well.  No chest pain.  No sob.  Followed by cardiology.  He has also been having swallowing issues.  Has had episodes of solids and liquids sticking.  Occasionally has had to regurgitate foods.  Feels like he has pressure in one area of his anterior neck.  Had barium swallow - ok.  Saw ENT - ok. States (per ENT) was to be referred back to GI.  No nausea.  Eating.  No abdominal pain.  Bowels stable.     Past Medical History:  Diagnosis Date  . Abnormal liver function tests   . Allergic state   . Arthritis    spine, hands, shoulder with previous cuff tear  . CAD (coronary artery disease)    a.  11/2015 CT Chest/Abd: Coronray Ca2+ noted;  b. MV: EF 45-54%, med defect of mod severity in basal infsept, basal inf, mid infsept, and mid inf region-> infarct and peri-infarct ischemia-->intermediate risk; c. 05/2016 Cath/PCI: LM 40ost, LAD 30p, 50m, D1 60ost, LCX 60ost, OM1 99 (small->Med Rx), RCA 60p, 95d (2.5x15 Resolute Onyx DES), RPDA/RPLB min irregs, D2 60, EF 55-65%/  . Colon polyp   . DDD (degenerative disc disease), lumbosacral   . Dysphagia   . Elevated transaminase level   . Esophageal stricture    a. s/p multiple dilations - last 03/2016.  . Ganglion cyst of finger of right hand   . GERD (gastroesophageal reflux disease)   . Guaiac positive stools   . Hiatal hernia   . HNP (herniated nucleus pulposus), lumbar   . Hypercholesterolemia   . Hypertension   . Lumbar radiculitis   . Mallory-Weiss tear    a. 11/2015.  . Migraine aura without headache   . Psoriatic arthritis (HCC)   . Sleep apnea   . Type II diabetes mellitus (HCC)     Past Surgical History:  Procedure Laterality Date  . ARTHROPLASTY     right thumb; left thumb  . BLEPHAROPLASTY    . CARPAL TUNNEL RELEASE     right  . CHOLECYSTECTOMY    . COLONOSCOPY    . CYST EXCISION  04/14/2015   tendon sheath cyst excision; right ring finger cyst removed and trigger finger realease  . ESOPHAGOGASTRODUODENOSCOPY    . HERNIA REPAIR    . LUMBAR DISC SURGERY    . NASAL SINUS SURGERY    . ROTATOR CUFF REPAIR    . TONSILLECTOMY     Family History  Problem Relation Age of Onset  . Lung cancer Father   . Hypertension Mother    Social History   Socioeconomic History  . Marital status: Married    Spouse name: None  . Number of children: None  . Years of education: None  . Highest education level: None  Social Needs  . Financial resource strain: None  . Food insecurity - worry: None  . Food insecurity - inability: None  . Transportation needs - medical: None  . Transportation needs - non-medical: None  Occupational History  . None  Tobacco Use  . Smoking status: Former   Smoker    Last attempt to quit: 07/06/1982    Years since quitting: 34.3  . Smokeless tobacco: Never Used  Substance and Sexual Activity  . Alcohol use: Yes    Alcohol/week: 0.0 oz    Comment: occasional drink.  . Drug use: No  . Sexual activity: None  Other Topics Concern  . None  Social History Narrative   Lives locally with wife.  Active but does not routinely exercise.    Outpatient Encounter Medications as of 11/15/2016  Medication Sig  . aspirin EC 81 MG tablet Take 81 mg by mouth daily.  Marland Kitchen atenolol (TENORMIN) 50 MG tablet TAKE 1 TABLET(50 MG) BY MOUTH TWICE DAILY  . COSENTYX SENSOREADY PEN 150 MG/ML SOAJ Inject 1 Dose into the skin every 30 (thirty) days. Due 82/99/3716  . folic acid (FOLVITE) 1 MG tablet Take 1 mg by mouth daily.  Marland Kitchen gabapentin (NEURONTIN) 300 MG capsule Take 300 mg by mouth 2 (two) times daily.   . hydrochlorothiazide (HYDRODIURIL) 25 MG tablet Take  0.5 tablets (12.5 mg total) by mouth daily.  . hydrochlorothiazide (HYDRODIURIL) 25 MG tablet TAKE 1 TABLET BY MOUTH EVERY DAY  . losartan (COZAAR) 50 MG tablet Take 1 tablet (50 mg total) by mouth daily. Patient needs to schedule a follow up appt. thanks  . metFORMIN (GLUCOPHAGE) 500 MG tablet TAKE 4 TABLETS BY MOUTH EVERY EVENING.  . Multiple Vitamin (MULTIVITAMIN) tablet Take 1 tablet by mouth daily.  . nitroGLYCERIN (NITROSTAT) 0.4 MG SL tablet Place 1 tablet (0.4 mg total) under the tongue every 5 (five) minutes as needed.  . pantoprazole (PROTONIX) 40 MG tablet Take 1 tablet (40 mg total) by mouth 2 (two) times daily.  . prasugrel (EFFIENT) 10 MG TABS tablet Take 10 mg by mouth daily.  . rosuvastatin (CRESTOR) 20 MG tablet Take 1 tablet (20 mg total) by mouth daily at 6 PM.  . saccharomyces boulardii (FLORASTOR) 250 MG capsule Take 250 mg by mouth 2 (two) times daily.  . traMADol (ULTRAM) 50 MG tablet Take 1 tablet (50 mg total) by mouth 2 (two) times daily as needed.  . vitamin E 100 UNIT capsule Take 400 Units by mouth daily.  . isosorbide mononitrate (IMDUR) 30 MG 24 hr tablet Take 1 tablet (30 mg total) by mouth daily. (Patient taking differently: Take 30 mg by mouth daily as needed. )   No facility-administered encounter medications on file as of 11/15/2016.     Review of Systems  Constitutional: Negative for appetite change and unexpected weight change.  HENT: Negative for congestion and sinus pressure.   Respiratory: Negative for cough, chest tightness and shortness of breath.   Cardiovascular: Negative for chest pain, palpitations and leg swelling.  Gastrointestinal: Negative for abdominal pain, diarrhea, nausea and vomiting.  Genitourinary: Negative for difficulty urinating and dysuria.  Musculoskeletal: Negative for myalgias.  Skin: Negative for color change and rash.  Neurological: Negative for dizziness, light-headedness and headaches.  Psychiatric/Behavioral: Negative for  agitation and dysphoric mood.       Objective:    Physical Exam  Constitutional: He appears well-developed and well-nourished. No distress.  HENT:  Nose: Nose normal.  Mouth/Throat: Oropharynx is clear and moist.  Neck: Neck supple. No thyromegaly present.  Cardiovascular: Normal rate and regular rhythm.  Pulmonary/Chest: Effort normal and breath sounds normal. No respiratory distress.  Abdominal: Soft. Bowel sounds are normal. There is no tenderness.  Musculoskeletal: He exhibits no edema or tenderness.  Lymphadenopathy:    He has  no cervical adenopathy.  Skin: No erythema.  Psychiatric: He has a normal mood and affect. His behavior is normal.    BP 118/66 (BP Location: Left Arm, Patient Position: Sitting, Cuff Size: Normal)   Pulse 64   Temp 98.6 F (37 C) (Oral)   Resp 14   Ht 5' 9" (1.753 m)   Wt 192 lb 6.4 oz (87.3 kg)   SpO2 98%   BMI 28.41 kg/m  Wt Readings from Last 3 Encounters:  11/15/16 192 lb 6.4 oz (87.3 kg)  08/01/16 197 lb 12 oz (89.7 kg)  06/25/16 193 lb 8 oz (87.8 kg)     Lab Results  Component Value Date   WBC 4.0 06/15/2016   HGB 11.7 (L) 06/15/2016   HCT 34.3 (L) 06/15/2016   PLT 148 (L) 06/15/2016   GLUCOSE 122 (H) 06/15/2016   CHOL 102 08/02/2016   TRIG 117 08/02/2016   HDL 31 (L) 08/02/2016   LDLCALC 48 08/02/2016   ALT 19 02/06/2016   AST 14 02/06/2016   NA 141 06/15/2016   K 3.3 (L) 06/15/2016   CL 108 06/15/2016   CREATININE 0.85 06/15/2016   BUN 10 06/15/2016   CO2 26 06/15/2016   TSH 1.93 04/07/2015   PSA 1.50 04/07/2015   INR 1.1 06/07/2016   HGBA1C 6.2 08/04/2015   MICROALBUR 2.3 (H) 08/04/2015    Dg Swallowing Func-speech Pathology  Result Date: 10/31/2016 Please refer to "Notes" tab for Speech Pathology notes.      Assessment & Plan:   Problem List Items Addressed This Visit    Abnormal liver function tests    Follow liver panel.        Anemia    Has been evaluated by GI.  Recheck cbc and ferritin.  Also  confirm not b12 deficient.        Relevant Orders   CBC with Differential/Platelet   Ferritin   IBC panel   Vitamin B12   CAD (coronary artery disease) - Primary    S/p stent RCA.  On effient.  Followed by cardiology.  Stable.  Continue risk factor modification.        Diabetes mellitus ([place])    Low carb diet and exercise.  Follow met b and a1c.        Relevant Orders   Hemoglobin A1c   Microalbumin / creatinine urine ratio   Dysphagia    Saw GI.  Had barium swallow - ok.  Saw ENT - ok.  Request referral back to GI.        Relevant Orders   Ambulatory referral to Gastroenterology   GERD (gastroesophageal reflux disease)    Controlled on protonix.        History of colonic polyps    Colonoscopy 08/2013.  Recommended f/u in 3 years.  He just had stent placed. Postponed temporarily.        Hyperlipidemia    Low cholesterol diet and exercise.  On crestor.  Follow lipid panel and liver function tests.          Relevant Orders   Hepatic function panel   Lipid panel   Hypertension    Blood pressure under good control.  Continue same medication regimen.  Follow pressures.  Follow metabolic panel.        Relevant Orders   TSH   Basic metabolic panel   Psoriatic arthritis ([place])    Followed by rheumatology.        Skin lesion  Persistent leg lesion.  Most clear with his treatments.  Discussed referral back to dermatology.  He declines.  Wants to follow.         Other Visit Diagnoses    Need for hepatitis C screening test       Relevant Orders   Hepatitis C antibody       , , MD  

## 2016-11-16 NOTE — Telephone Encounter (Signed)
Ok to schedule pneumonia (prevnar) and tetanus shot.

## 2016-11-18 ENCOUNTER — Other Ambulatory Visit: Payer: Self-pay | Admitting: Internal Medicine

## 2016-11-18 ENCOUNTER — Encounter: Payer: Self-pay | Admitting: Internal Medicine

## 2016-11-18 DIAGNOSIS — N183 Chronic kidney disease, stage 3 unspecified: Secondary | ICD-10-CM

## 2016-11-18 DIAGNOSIS — Z125 Encounter for screening for malignant neoplasm of prostate: Secondary | ICD-10-CM

## 2016-11-18 DIAGNOSIS — E0822 Diabetes mellitus due to underlying condition with diabetic chronic kidney disease: Secondary | ICD-10-CM

## 2016-11-18 NOTE — Assessment & Plan Note (Signed)
Follow liver panel.  

## 2016-11-18 NOTE — Assessment & Plan Note (Signed)
Blood pressure under good control.  Continue same medication regimen.  Follow pressures.  Follow metabolic panel.   

## 2016-11-18 NOTE — Telephone Encounter (Signed)
Ok to schedule nurse visit.

## 2016-11-18 NOTE — Assessment & Plan Note (Signed)
Followed by rheumatology. 

## 2016-11-18 NOTE — Assessment & Plan Note (Signed)
Has been evaluated by GI.  Recheck cbc and ferritin.  Also confirm not b12 deficient.

## 2016-11-18 NOTE — Progress Notes (Signed)
Order placed for lab 

## 2016-11-18 NOTE — Assessment & Plan Note (Signed)
Low cholesterol diet and exercise.  On crestor.  Follow lipid panel and liver function tests.  

## 2016-11-18 NOTE — Assessment & Plan Note (Signed)
Colonoscopy 08/2013.  Recommended f/u in 3 years.  He just had stent placed. Postponed temporarily.

## 2016-11-18 NOTE — Assessment & Plan Note (Signed)
S/p stent RCA.  On effient.  Followed by cardiology.  Stable.  Continue risk factor modification.

## 2016-11-18 NOTE — Assessment & Plan Note (Signed)
Persistent leg lesion.  Most clear with his treatments.  Discussed referral back to dermatology.  He declines.  Wants to follow.

## 2016-11-18 NOTE — Assessment & Plan Note (Signed)
Controlled on protonix.   

## 2016-11-18 NOTE — Assessment & Plan Note (Signed)
Saw GI.  Had barium swallow - ok.  Saw ENT - ok.  Request referral back to GI.

## 2016-11-18 NOTE — Assessment & Plan Note (Signed)
Low carb diet and exercise.  Follow met b and a1c.   

## 2016-11-21 ENCOUNTER — Other Ambulatory Visit: Payer: Self-pay | Admitting: Internal Medicine

## 2016-11-21 MED ORDER — GABAPENTIN 300 MG PO CAPS: 300.0000 mg | ORAL_CAPSULE | Freq: Two times a day (BID) | ORAL | 1 refills | Status: DC

## 2016-11-21 NOTE — Telephone Encounter (Signed)
Ok to refill 

## 2016-11-21 NOTE — Telephone Encounter (Signed)
Please clarify at pts pharmacy who has been refilling the medication and when last refilled.

## 2016-11-22 ENCOUNTER — Ambulatory Visit (INDEPENDENT_AMBULATORY_CARE_PROVIDER_SITE_OTHER): Payer: BLUE CROSS/BLUE SHIELD

## 2016-11-22 DIAGNOSIS — Z23 Encounter for immunization: Secondary | ICD-10-CM

## 2016-11-23 ENCOUNTER — Other Ambulatory Visit: Payer: Self-pay | Admitting: Internal Medicine

## 2016-11-25 ENCOUNTER — Encounter: Payer: Self-pay | Admitting: Internal Medicine

## 2016-11-26 NOTE — Telephone Encounter (Signed)
Patient stated arm is feeling better today, not as sore or as red, no fever for 24 hours, advised patient to let office know if any change.

## 2016-11-26 NOTE — Telephone Encounter (Signed)
Please call and f/u with pt regarding how he is doing today.  Confirm arm doing ok.   Thanks

## 2016-11-29 ENCOUNTER — Encounter: Payer: Self-pay | Admitting: Internal Medicine

## 2016-11-29 ENCOUNTER — Other Ambulatory Visit (INDEPENDENT_AMBULATORY_CARE_PROVIDER_SITE_OTHER): Payer: BLUE CROSS/BLUE SHIELD

## 2016-11-29 DIAGNOSIS — Z1159 Encounter for screening for other viral diseases: Secondary | ICD-10-CM

## 2016-11-29 DIAGNOSIS — I10 Essential (primary) hypertension: Secondary | ICD-10-CM | POA: Diagnosis not present

## 2016-11-29 DIAGNOSIS — N183 Chronic kidney disease, stage 3 unspecified: Secondary | ICD-10-CM

## 2016-11-29 DIAGNOSIS — Z125 Encounter for screening for malignant neoplasm of prostate: Secondary | ICD-10-CM | POA: Diagnosis not present

## 2016-11-29 DIAGNOSIS — E785 Hyperlipidemia, unspecified: Secondary | ICD-10-CM | POA: Diagnosis not present

## 2016-11-29 DIAGNOSIS — D649 Anemia, unspecified: Secondary | ICD-10-CM

## 2016-11-29 DIAGNOSIS — E0822 Diabetes mellitus due to underlying condition with diabetic chronic kidney disease: Secondary | ICD-10-CM | POA: Diagnosis not present

## 2016-11-29 LAB — CBC WITH DIFFERENTIAL/PLATELET
BASOS ABS: 0 10*3/uL (ref 0.0–0.1)
BASOS PCT: 0.6 % (ref 0.0–3.0)
Eosinophils Absolute: 0.3 10*3/uL (ref 0.0–0.7)
Eosinophils Relative: 5.8 % — ABNORMAL HIGH (ref 0.0–5.0)
HEMATOCRIT: 38.8 % — AB (ref 39.0–52.0)
Hemoglobin: 12.7 g/dL — ABNORMAL LOW (ref 13.0–17.0)
LYMPHS ABS: 1.7 10*3/uL (ref 0.7–4.0)
Lymphocytes Relative: 32.8 % (ref 12.0–46.0)
MCHC: 32.8 g/dL (ref 30.0–36.0)
MCV: 85.4 fl (ref 78.0–100.0)
MONOS PCT: 11.8 % (ref 3.0–12.0)
Monocytes Absolute: 0.6 10*3/uL (ref 0.1–1.0)
NEUTROS ABS: 2.5 10*3/uL (ref 1.4–7.7)
NEUTROS PCT: 49 % (ref 43.0–77.0)
PLATELETS: 207 10*3/uL (ref 150.0–400.0)
RBC: 4.54 Mil/uL (ref 4.22–5.81)
RDW: 16 % — AB (ref 11.5–15.5)
WBC: 5 10*3/uL (ref 4.0–10.5)

## 2016-11-29 LAB — FERRITIN: Ferritin: 16 ng/mL — ABNORMAL LOW (ref 22.0–322.0)

## 2016-11-29 LAB — TSH: TSH: 1.53 u[IU]/mL (ref 0.35–4.50)

## 2016-11-29 LAB — BASIC METABOLIC PANEL
BUN: 13 mg/dL (ref 6–23)
CHLORIDE: 101 meq/L (ref 96–112)
CO2: 31 mEq/L (ref 19–32)
Calcium: 9.4 mg/dL (ref 8.4–10.5)
Creatinine, Ser: 0.9 mg/dL (ref 0.40–1.50)
GFR: 89.15 mL/min (ref >60.00)
Glucose, Bld: 118 mg/dL — ABNORMAL HIGH (ref 70–99)
POTASSIUM: 3.7 meq/L (ref 3.5–5.1)
Sodium: 142 mEq/L (ref 135–145)

## 2016-11-29 LAB — HEPATIC FUNCTION PANEL
ALBUMIN: 4.2 g/dL (ref 3.5–5.2)
ALK PHOS: 39 U/L (ref 39–117)
ALT: 15 U/L (ref 0–53)
AST: 16 U/L (ref 0–37)
BILIRUBIN TOTAL: 0.5 mg/dL (ref 0.2–1.2)
Bilirubin, Direct: 0.1 mg/dL (ref 0.0–0.3)
Total Protein: 7.3 g/dL (ref 6.0–8.3)

## 2016-11-29 LAB — LIPID PANEL
CHOLESTEROL: 92 mg/dL (ref 0–200)
HDL: 30.9 mg/dL — AB (ref >39.00)
LDL Cholesterol: 40 mg/dL (ref 0–99)
NonHDL: 61.27
Total CHOL/HDL Ratio: 3
Triglycerides: 105 mg/dL (ref 0.0–149.0)
VLDL: 21 mg/dL (ref 0.0–40.0)

## 2016-11-29 LAB — HEMOGLOBIN A1C: HEMOGLOBIN A1C: 6.6 % — AB (ref 4.6–6.5)

## 2016-11-29 LAB — IBC PANEL
Iron: 67 ug/dL (ref 42–165)
SATURATION RATIOS: 17.1 % — AB (ref 20.0–50.0)
TRANSFERRIN: 280 mg/dL (ref 212.0–360.0)

## 2016-11-29 LAB — MICROALBUMIN / CREATININE URINE RATIO
Creatinine,U: 242.9 mg/dL
Microalb Creat Ratio: 1.8 mg/g (ref 0.0–30.0)
Microalb, Ur: 4.4 mg/dL — ABNORMAL HIGH (ref 0.0–1.9)

## 2016-11-29 LAB — PSA, MEDICARE: PSA: 2.35 ng/ml (ref 0.10–4.00)

## 2016-11-29 LAB — VITAMIN B12: Vitamin B-12: 171 pg/mL — ABNORMAL LOW (ref 211–911)

## 2016-11-30 LAB — HEPATITIS C ANTIBODY
HEP C AB: NONREACTIVE
SIGNAL TO CUT-OFF: 0.05 (ref <1.00)

## 2016-12-03 ENCOUNTER — Other Ambulatory Visit: Payer: Self-pay | Admitting: Cardiovascular Disease

## 2016-12-03 ENCOUNTER — Other Ambulatory Visit: Payer: Self-pay | Admitting: Internal Medicine

## 2016-12-13 ENCOUNTER — Ambulatory Visit: Payer: BLUE CROSS/BLUE SHIELD

## 2016-12-17 ENCOUNTER — Encounter: Payer: Self-pay | Admitting: Internal Medicine

## 2016-12-26 ENCOUNTER — Encounter: Payer: Self-pay | Admitting: Internal Medicine

## 2016-12-27 ENCOUNTER — Ambulatory Visit (INDEPENDENT_AMBULATORY_CARE_PROVIDER_SITE_OTHER): Payer: BLUE CROSS/BLUE SHIELD

## 2016-12-27 DIAGNOSIS — E538 Deficiency of other specified B group vitamins: Secondary | ICD-10-CM | POA: Diagnosis not present

## 2016-12-27 MED ORDER — CYANOCOBALAMIN 1000 MCG/ML IJ SOLN
1000.0000 ug | Freq: Once | INTRAMUSCULAR | Status: AC
  Administered 2016-12-27: 1000 ug via INTRAMUSCULAR

## 2016-12-27 NOTE — Progress Notes (Addendum)
Patient came in for B-12 injection. Patient tolerated injection very well.  He wanted to know if he can give himself the B-12 injections in the future as to not have to come in to the clinic.  Patient stated he has been trained and had to give his wife shots in the past.  Please advise.  Reviewed above information.  I am ok with pt administering his B12 injections, but he needs to come in for next b12 and show confirm proper administration.    Dr Nicki Reaper

## 2016-12-28 ENCOUNTER — Other Ambulatory Visit: Payer: Self-pay | Admitting: Nurse Practitioner

## 2016-12-28 ENCOUNTER — Other Ambulatory Visit: Payer: Self-pay | Admitting: Internal Medicine

## 2016-12-28 NOTE — Progress Notes (Signed)
See note.  Needs to come in for next B12 and confirm proper administration.

## 2016-12-31 ENCOUNTER — Other Ambulatory Visit: Payer: Self-pay | Admitting: Internal Medicine

## 2016-12-31 ENCOUNTER — Ambulatory Visit
Admission: RE | Admit: 2016-12-31 | Payer: BLUE CROSS/BLUE SHIELD | Source: Ambulatory Visit | Admitting: Gastroenterology

## 2016-12-31 ENCOUNTER — Encounter: Admission: RE | Payer: Self-pay | Source: Ambulatory Visit

## 2016-12-31 SURGERY — COLONOSCOPY WITH PROPOFOL
Anesthesia: General

## 2016-12-31 NOTE — Progress Notes (Signed)
Ok to given injections with same instructions (at home).

## 2016-12-31 NOTE — Progress Notes (Signed)
Patient demonstrated on how to inject his self during nurse visit. And everything he was doing is correct for giving himself an injection.

## 2017-01-03 LAB — HM DIABETES EYE EXAM

## 2017-01-13 ENCOUNTER — Other Ambulatory Visit: Payer: Self-pay | Admitting: Internal Medicine

## 2017-01-14 ENCOUNTER — Encounter: Payer: Self-pay | Admitting: Internal Medicine

## 2017-02-02 ENCOUNTER — Other Ambulatory Visit: Payer: Self-pay | Admitting: Internal Medicine

## 2017-02-05 ENCOUNTER — Telehealth: Payer: Self-pay | Admitting: Cardiovascular Disease

## 2017-02-05 NOTE — Telephone Encounter (Signed)
Dr. Fletcher Anon has addressed this concern with Dr. Donnella Sham

## 2017-02-05 NOTE — Telephone Encounter (Signed)
Dr. Gustavo Lah wants to talk to [facility]  asap re: mallory weiss tear patient on blood thinner   Rhonda from [facility] calling .  Given phone number to [facility] office .  Also paged Dr. Fletcher Anon with call back information.  Marylou Mccoy that there is an on call provider that can be contacted via [facility] switchboard for emergencies as well.

## 2017-02-06 ENCOUNTER — Other Ambulatory Visit: Payer: Self-pay | Admitting: *Deleted

## 2017-02-06 DIAGNOSIS — I6529 Occlusion and stenosis of unspecified carotid artery: Secondary | ICD-10-CM

## 2017-02-07 ENCOUNTER — Other Ambulatory Visit: Payer: Self-pay | Admitting: Internal Medicine

## 2017-02-13 ENCOUNTER — Encounter: Payer: Self-pay | Admitting: *Deleted

## 2017-02-14 ENCOUNTER — Encounter
Admission: RE | Disposition: A | Discharge status: Home / Self Care | Payer: Self-pay | Source: Ambulatory Visit | Attending: Gastroenterology

## 2017-02-14 ENCOUNTER — Ambulatory Visit: Payer: BLUE CROSS/BLUE SHIELD | Admitting: Anesthesiology

## 2017-02-14 ENCOUNTER — Ambulatory Visit
Admission: RE | Admit: 2017-02-14 | Discharge: 2017-02-14 | Disposition: A | Discharge status: Home / Self Care | Payer: BLUE CROSS/BLUE SHIELD | Source: Ambulatory Visit | Attending: Gastroenterology | Admitting: Gastroenterology

## 2017-02-14 DIAGNOSIS — Z7984 Long term (current) use of oral hypoglycemic drugs: Secondary | ICD-10-CM | POA: Diagnosis not present

## 2017-02-14 DIAGNOSIS — I10 Essential (primary) hypertension: Secondary | ICD-10-CM | POA: Insufficient documentation

## 2017-02-14 DIAGNOSIS — I2510 Atherosclerotic heart disease of native coronary artery without angina pectoris: Secondary | ICD-10-CM | POA: Insufficient documentation

## 2017-02-14 DIAGNOSIS — K449 Diaphragmatic hernia without obstruction or gangrene: Secondary | ICD-10-CM | POA: Insufficient documentation

## 2017-02-14 DIAGNOSIS — Z881 Allergy status to other antibiotic agents status: Secondary | ICD-10-CM | POA: Diagnosis not present

## 2017-02-14 DIAGNOSIS — B3781 Candidal esophagitis: Secondary | ICD-10-CM | POA: Insufficient documentation

## 2017-02-14 DIAGNOSIS — G4730 Sleep apnea, unspecified: Secondary | ICD-10-CM | POA: Insufficient documentation

## 2017-02-14 DIAGNOSIS — Z888 Allergy status to other drugs, medicaments and biological substances status: Secondary | ICD-10-CM | POA: Diagnosis not present

## 2017-02-14 DIAGNOSIS — Z7982 Long term (current) use of aspirin: Secondary | ICD-10-CM | POA: Insufficient documentation

## 2017-02-14 DIAGNOSIS — Z886 Allergy status to analgesic agent status: Secondary | ICD-10-CM | POA: Diagnosis not present

## 2017-02-14 DIAGNOSIS — Z79899 Other long term (current) drug therapy: Secondary | ICD-10-CM | POA: Insufficient documentation

## 2017-02-14 DIAGNOSIS — Z880 Allergy status to penicillin: Secondary | ICD-10-CM | POA: Diagnosis not present

## 2017-02-14 DIAGNOSIS — Z885 Allergy status to narcotic agent status: Secondary | ICD-10-CM | POA: Insufficient documentation

## 2017-02-14 DIAGNOSIS — E119 Type 2 diabetes mellitus without complications: Secondary | ICD-10-CM | POA: Insufficient documentation

## 2017-02-14 DIAGNOSIS — K219 Gastro-esophageal reflux disease without esophagitis: Secondary | ICD-10-CM | POA: Insufficient documentation

## 2017-02-14 DIAGNOSIS — E7800 Pure hypercholesterolemia, unspecified: Secondary | ICD-10-CM | POA: Diagnosis not present

## 2017-02-14 DIAGNOSIS — K222 Esophageal obstruction: Secondary | ICD-10-CM | POA: Diagnosis present

## 2017-02-14 HISTORY — PX: BALLOON DILATION: SHX5330

## 2017-02-14 HISTORY — PX: ESOPHAGOGASTRODUODENOSCOPY (EGD) WITH PROPOFOL: SHX5813

## 2017-02-14 LAB — GLUCOSE, CAPILLARY: Glucose-Capillary: 114 mg/dL — ABNORMAL HIGH (ref 65–99)

## 2017-02-14 SURGERY — ESOPHAGOGASTRODUODENOSCOPY (EGD) WITH PROPOFOL
Anesthesia: General

## 2017-02-14 MED ORDER — GLYCOPYRROLATE 0.2 MG/ML IJ SOLN
INTRAMUSCULAR | Status: AC
  Filled 2017-02-14: qty 1

## 2017-02-14 MED ORDER — PROPOFOL 500 MG/50ML IV EMUL
INTRAVENOUS | Status: DC | PRN
  Administered 2017-02-14: 150 ug/kg/min via INTRAVENOUS

## 2017-02-14 MED ORDER — MIDAZOLAM HCL 2 MG/2ML IJ SOLN
INTRAMUSCULAR | Status: DC | PRN
  Administered 2017-02-14: 2 mg via INTRAVENOUS

## 2017-02-14 MED ORDER — GLYCOPYRROLATE 0.2 MG/ML IJ SOLN
INTRAMUSCULAR | Status: DC | PRN
  Administered 2017-02-14: 0.2 mg via INTRAVENOUS

## 2017-02-14 MED ORDER — SODIUM CHLORIDE 0.9 % IV SOLN
INTRAVENOUS | Status: DC
  Administered 2017-02-14: 10:00:00 via INTRAVENOUS
  Administered 2017-02-14: 1000 mL via INTRAVENOUS

## 2017-02-14 MED ORDER — MIDAZOLAM HCL 2 MG/2ML IJ SOLN
INTRAMUSCULAR | Status: AC
  Filled 2017-02-14: qty 2

## 2017-02-14 MED ORDER — SODIUM CHLORIDE 0.9 % IV SOLN: INTRAVENOUS | Status: DC

## 2017-02-14 MED ORDER — PHENYLEPHRINE HCL 10 MG/ML IJ SOLN
INTRAMUSCULAR | Status: DC | PRN
  Administered 2017-02-14 (×2): 200 ug via INTRAVENOUS

## 2017-02-14 MED ORDER — PROPOFOL 500 MG/50ML IV EMUL
INTRAVENOUS | Status: AC
  Filled 2017-02-14: qty 50

## 2017-02-14 MED ORDER — LIDOCAINE HCL (PF) 2 % IJ SOLN
INTRAMUSCULAR | Status: AC
  Filled 2017-02-14: qty 10

## 2017-02-14 MED ORDER — LIDOCAINE HCL (CARDIAC) 20 MG/ML IV SOLN
INTRAVENOUS | Status: DC | PRN
  Administered 2017-02-14: 80 mg via INTRAVENOUS

## 2017-02-14 MED ORDER — PROPOFOL 10 MG/ML IV BOLUS
INTRAVENOUS | Status: DC | PRN
  Administered 2017-02-14: 60 mg via INTRAVENOUS

## 2017-02-14 NOTE — Anesthesia Post-op Follow-up Note (Signed)
Anesthesia QCDR form completed.        

## 2017-02-14 NOTE — Op Note (Addendum)
[facility] Patient Name: Shane Mcknight Procedure Date: 02/14/2017 7:30 AM MRN: [id number] Account #: [id number] Date of Birth: 1950/11/25 Admit Type: Outpatient Age: 67 Room: [facility] ENDO ROOM 4 Gender: Male Note Status: Finalized Procedure:            Upper GI endoscopy Indications:          Dysphagia Providers:            Lollie Sails, MD, Manya Silvas, MD Referring MD:         Einar Pheasant, MD (Referring MD) Medicines:            Monitored Anesthesia Care Complications:        No immediate complications. Procedure:            Pre-Anesthesia Assessment:                       - ASA Grade Assessment: III - A patient with severe                        systemic disease.                       After obtaining informed consent, the endoscope was                        passed under direct vision. Throughout the procedure,                        the patient's blood pressure, pulse, and oxygen                        saturations were monitored continuously. The Endoscope                        was introduced through the mouth, and advanced to the                        lower third of esophagus. The upper GI endoscopy was                        accomplished without difficulty. The patient tolerated                        the procedure well. Findings:      Patchy candidiasis was found in the entire esophagus.      One severe benign-appearing, intrinsic stenosis was found. This measured       7-8 mm (inner diameter). The H190 would not pass. A guidewire was placed       and the scope was withdrawn. Dilation was performed with a Savary       dilator with mild resistance at 42 Fr and 45 Fr and moderate resistance       at 42 Fr. Impression:           - Monilial esophagitis.                       - Benign-appearing esophageal stenosis. Dilated.                       - No specimens collected. Recommendation:       -  Discharge patient to home.              - Clear liquid diet today.                       - Full liquid diet for 2 days, then advance as                        tolerated to soft diet for 1 week.                       - Use Protonix (pantoprazole) 40 mg PO BID.                       - Diflucan (fluconazole) 200 mg day one then 100 mg PO                        daily for 4 days.                       - Repeat upper endoscopy in 2-3 weeks. for retreatment. Procedure Code(s):    --- Professional ---                       [phone], Esophagoscopy, flexible, transoral; with                        insertion of guide wire followed by passage of                        dilator(s) over guide wire Diagnosis Code(s):    --- Professional ---                       B37.81, Candidal esophagitis                       K22.2, Esophageal obstruction                       R13.10, Dysphagia, unspecified CPT copyright 2016 American Medical Association. All rights reserved. The codes documented in this report are preliminary and upon coder review may  be revised to meet current compliance requirements. Lollie Sails, MD 02/14/2017 10:29:27 AM This report has been signed electronically. Manya Silvas, MD Number of Addenda: 0 Note Initiated On: 02/14/2017 7:30 AM      [facility]

## 2017-02-14 NOTE — Transfer of Care (Signed)
Immediate Anesthesia Transfer of Care Note  Patient: Shane Mcknight  Procedure(s) Performed: ESOPHAGOGASTRODUODENOSCOPY (EGD) WITH PROPOFOL (N/A ) BALLOON DILATION (N/A )  Patient Location: Endoscopy Unit  Anesthesia Type:General  Level of Consciousness: drowsy and patient cooperative  Airway & Oxygen Therapy: Patient Spontanous Breathing and Patient connected to nasal cannula oxygen  Post-op Assessment: Report given to RN and Post -op Vital signs reviewed and stable  Post vital signs: Reviewed and stable  Last Vitals:  Vitals:   02/14/17 0937 02/14/17 1020  BP: (!) 99/50   Pulse: (!) 54   Resp: 16   Temp: (!) 36.4 C (!) 36.2 C  SpO2: 98%     Last Pain:  Vitals:   02/14/17 1020  TempSrc: Tympanic         Complications: No apparent anesthesia complications

## 2017-02-14 NOTE — Anesthesia Preprocedure Evaluation (Signed)
Anesthesia Evaluation  Patient identified by MRN, date of birth, ID band Patient awake    Reviewed: Allergy & Precautions, NPO status , Patient's Chart, lab work & pertinent test results  History of Anesthesia Complications Negative for: history of anesthetic complications  Airway Mallampati: II  TM Distance: >3 FB Neck ROM: Full    Dental  (+) Upper Dentures, Lower Dentures   Pulmonary sleep apnea , neg COPD, former smoker,    breath sounds clear to auscultation- rhonchi (-) wheezing      Cardiovascular Exercise Tolerance: Good hypertension, Pt. on medications (-) angina+ CAD and + Cardiac Stents  (-) CABG  Rhythm:Regular Rate:Normal - Systolic murmurs and - Diastolic murmurs L heart cath 06/14/16:  The left ventricular systolic function is normal.  LV end diastolic pressure is normal.  The left ventricular ejection fraction is 55-65% by visual estimate.  Prox RCA lesion, 60 %stenosed.  2nd RPLB lesion, 60 %stenosed.  1st Mrg lesion, 99 %stenosed.  Mid LAD lesion, 50 %stenosed.  Ost LM to LM lesion, 40 %stenosed.  Prox LAD lesion, 30 %stenosed.  Ost 1st Diag to 1st Diag lesion, 60 %stenosed.  Ost Cx lesion, 60 %stenosed.  A STENT RESOLUTE ONYX 2.5X15 drug eluting stent was successfully placed.  Dist RCA lesion, 95 %stenosed.  Post intervention, there is a 0% residual stenosis    Neuro/Psych  Headaches, negative psych ROS   GI/Hepatic Neg liver ROS, hiatal hernia, GERD  ,  Endo/Other  diabetes, Oral Hypoglycemic Agents  Renal/GU negative Renal ROS     Musculoskeletal  (+) Arthritis ,   Abdominal (+) - obese,   Peds  Hematology  (+) anemia ,   Anesthesia Other Findings Past Medical History: No date: Abnormal liver function tests No date: Allergic state No date: Arthritis     Comment:  spine, hands, shoulder with previous cuff tear No date: CAD (coronary artery disease)     Comment:  a.   11/2015 CT Chest/Abd: Coronray Ca2+ noted;  b. MV:               EF 45-54%, med defect of mod severity in basal infsept,               basal inf, mid infsept, and mid inf region-> infarct and               peri-infarct ischemia-->intermediate risk; c. 05/2016               Cath/PCI: LM 40ost, LAD 30p, 62m, D1 60ost, LCX 60ost,               OM1 99 (small->Med Rx), RCA 60p, 95d (2.5x15 Resolute               Onyx DES), RPDA/RPLB min irregs, D2 60, EF 55-65%/ No date: Colon polyp No date: DDD (degenerative disc disease), lumbosacral No date: Dysphagia No date: Elevated transaminase level No date: Esophageal stricture     Comment:  a. s/p multiple dilations - last 03/2016. No date: Ganglion cyst of finger of right hand No date: GERD (gastroesophageal reflux disease) No date: Guaiac positive stools No date: Hiatal hernia No date: HNP (herniated nucleus pulposus), lumbar No date: Hypercholesterolemia No date: Hypertension No date: Lumbar radiculitis No date: Mallory-Weiss tear     Comment:  a. 11/2015. No date: Migraine aura without headache No date: Psoriatic arthritis ([place]) No date: Sleep apnea No date: Type II diabetes mellitus ([place])   Reproductive/Obstetrics  Anesthesia Physical Anesthesia Plan  ASA: III  Anesthesia Plan: General   Post-op Pain Management:    Induction: Intravenous  PONV Risk Score and Plan: 1 and Propofol infusion  Airway Management Planned: Natural Airway  Additional Equipment:   Intra-op Plan:   Post-operative Plan:   Informed Consent: I have reviewed the patients History and Physical, chart, labs and discussed the procedure including the risks, benefits and alternatives for the proposed anesthesia with the patient or authorized representative who has indicated his/her understanding and acceptance.   Dental advisory given  Plan Discussed with: CRNA and Anesthesiologist  Anesthesia Plan  Comments:         Anesthesia Quick Evaluation

## 2017-02-14 NOTE — Anesthesia Postprocedure Evaluation (Signed)
Anesthesia Post Note  Patient: Shane Mcknight  Procedure(s) Performed: ESOPHAGOGASTRODUODENOSCOPY (EGD) WITH PROPOFOL (N/A ) BALLOON DILATION (N/A )  Patient location during evaluation: Endoscopy Anesthesia Type: General Level of consciousness: awake and alert and oriented Pain management: pain level controlled Vital Signs Assessment: post-procedure vital signs reviewed and stable Respiratory status: spontaneous breathing, nonlabored ventilation and respiratory function stable Cardiovascular status: blood pressure returned to baseline and stable Postop Assessment: no signs of nausea or vomiting Anesthetic complications: no     Last Vitals:  Vitals:   02/14/17 1040 02/14/17 1050  BP: (!) 93/59 96/62  Pulse:    Resp: 19   Temp:    SpO2:      Last Pain:  Vitals:   02/14/17 1050  TempSrc:   PainSc: 0-No pain                 Chenoah Mcnally

## 2017-02-14 NOTE — H&P (Signed)
Outpatient short stay form Pre-procedure 02/14/2017 9:51 AM Lollie Sails MD  Primary Physician: Dr. Einar Pheasant  Reason for visit: Dysphagia  History of present illness: Patient is a 67 year old male presenting today as above.  Has a personal history of recurrent dysphagia over the past several years.  He has been dilated several times.  Initially about a week ago where something got stuck and then he had some irritation passages from pink mucous no bright red bleeding.  This was on SPECT duration/emesis.  He has done well since then.  He has been taken off of his Effient but thinner now day #9-10.  Taken 81 mg aspirin which he is stopped for 2 days.  Takes no other aspirin products or blood thinning agents.  He has had no regurgitation since this most recent episode.  He currently has no odynophagia.    Current Facility-Administered Medications:  .  0.9 %  sodium chloride infusion, , Intravenous, Continuous, Lollie Sails, MD .  0.9 %  sodium chloride infusion, , Intravenous, Continuous, Lollie Sails, MD  Medications Prior to Admission  Medication Sig Dispense Refill Last Dose  . aspirin EC 81 MG tablet Take 81 mg by mouth daily.   02/13/2017 at Unknown time  . atenolol (TENORMIN) 50 MG tablet TAKE 1 TABLET(50 MG) BY MOUTH TWICE DAILY 60 tablet 0 02/14/2017 at 0600  . COSENTYX SENSOREADY PEN 150 MG/ML SOAJ Inject 1 Dose into the skin every 30 (thirty) days. Due 12/16/2015  10 Past Week at Unknown time  . folic acid (FOLVITE) 1 MG tablet Take 1 mg by mouth daily.   02/13/2017 at Unknown time  . gabapentin (NEURONTIN) 300 MG capsule TAKE 1 CAPSULE(300 MG) BY MOUTH TWICE DAILY 60 capsule 0 02/13/2017 at Unknown time  . hydrochlorothiazide (HYDRODIURIL) 25 MG tablet Take 0.5 tablets (12.5 mg total) by mouth daily. 30 tablet 0 02/14/2017 at 0600  . hydrochlorothiazide (HYDRODIURIL) 25 MG tablet TAKE 1 TABLET BY MOUTH EVERY DAY 30 tablet 0 02/14/2017 at Unknown time  . losartan (COZAAR)  50 MG tablet TAKE 1 TABLET(50 MG) BY MOUTH DAILY. FOLLOW UP APPOINTMENT.THANKS 90 tablet 0 02/14/2017 at 0600  . metFORMIN (GLUCOPHAGE) 500 MG tablet TAKE 4 TABLETS BY MOUTH EVERY EVENING. 120 tablet 0 02/14/2017 at 0600  . Multiple Vitamin (MULTIVITAMIN) tablet Take 1 tablet by mouth daily.   Past Week at Unknown time  . nitroGLYCERIN (NITROSTAT) 0.4 MG SL tablet Place 1 tablet (0.4 mg total) under the tongue every 5 (five) minutes as needed. 25 tablet 3 Past Month at Unknown time  . pantoprazole (PROTONIX) 40 MG tablet Take 1 tablet (40 mg total) by mouth 2 (two) times daily. 60 tablet 0 02/14/2017 at 0600  . prasugrel (EFFIENT) 10 MG TABS tablet Take 10 mg by mouth daily.   Past Week at Unknown time  . prasugrel (EFFIENT) 10 MG TABS tablet TAKE 1 TABLET(10 MG) BY MOUTH DAILY 30 tablet 3 Past Week at Unknown time  . vitamin E 100 UNIT capsule Take 400 Units by mouth daily.   Past Week at Unknown time  . isosorbide mononitrate (IMDUR) 30 MG 24 hr tablet Take 1 tablet (30 mg total) by mouth daily. (Patient taking differently: Take 30 mg by mouth daily as needed. ) 30 tablet 3 Taking  . rosuvastatin (CRESTOR) 20 MG tablet TAKE 1 TABLET BY MOUTH EVERY DAY AT 6 PM (Patient not taking: Reported on 02/14/2017) 30 tablet 2 Not Taking at Unknown time  . saccharomyces  boulardii (FLORASTOR) 250 MG capsule Take 250 mg by mouth 2 (two) times daily.   Not Taking at Unknown time  . traMADol (ULTRAM) 50 MG tablet Take 1 tablet (50 mg total) by mouth 2 (two) times daily as needed. 40 tablet 0 Taking     Allergies  Allergen Reactions  . Amoxicillin   . Ephedrine Other (See Comments)    pain  . Levsin [Hyoscyamine Sulfate] Other (See Comments)    Urinary retention  . Naprosyn [Naproxen] Other (See Comments)    GI upset   . Penicillins   . Sudafed [Pseudoephedrine Hcl]   . Vancomycin   . Vicodin [Hydrocodone-Acetaminophen]     Other reaction(s): Other (See Comments) GI Upset  . Other Rash    Paper tape  .  Shellfish Allergy Rash     Past Medical History:  Diagnosis Date  . Abnormal liver function tests   . Allergic state   . Arthritis    spine, hands, shoulder with previous cuff tear  . CAD (coronary artery disease)    a.  11/2015 CT Chest/Abd: Coronray Ca2+ noted;  b. MV: EF 45-54%, med defect of mod severity in basal infsept, basal inf, mid infsept, and mid inf region-> infarct and peri-infarct ischemia-->intermediate risk; c. 05/2016 Cath/PCI: LM 40ost, LAD 30p, 45m, D1 60ost, LCX 60ost, OM1 99 (small->Med Rx), RCA 60p, 95d (2.5x15 Resolute Onyx DES), RPDA/RPLB min irregs, D2 60, EF 55-65%/  . Colon polyp   . DDD (degenerative disc disease), lumbosacral   . Dysphagia   . Elevated transaminase level   . Esophageal stricture    a. s/p multiple dilations - last 03/2016.  . Ganglion cyst of finger of right hand   . GERD (gastroesophageal reflux disease)   . Guaiac positive stools   . Hiatal hernia   . HNP (herniated nucleus pulposus), lumbar   . Hypercholesterolemia   . Hypertension   . Lumbar radiculitis   . Mallory-Weiss tear    a. 11/2015.  . Migraine aura without headache   . Psoriatic arthritis ([place])   . Sleep apnea   . Type II diabetes mellitus ([place])     Review of systems:      Physical Exam    Heart and lungs: Regular rate and rhythm without rub or gallop, lungs are bilaterally clear.    HEENT: Normocephalic atraumatic eyes are anicteric    Other:    Pertinant exam for procedure: Soft nontender nondistended bowel sounds positive normoactive.    Planned proceedures: EGD and indicated procedures. I have discussed the risks benefits and complications of procedures to include not limited to bleeding, infection, perforation and the risk of sedation and the patient wishes to proceed.    Lollie Sails, MD Gastroenterology 02/14/2017  9:51 AM

## 2017-02-17 ENCOUNTER — Encounter: Payer: Self-pay | Admitting: Gastroenterology

## 2017-02-21 ENCOUNTER — Encounter: Payer: Self-pay | Admitting: Internal Medicine

## 2017-02-21 ENCOUNTER — Ambulatory Visit (INDEPENDENT_AMBULATORY_CARE_PROVIDER_SITE_OTHER): Payer: BLUE CROSS/BLUE SHIELD | Admitting: Internal Medicine

## 2017-02-21 VITALS — BP 120/78 | HR 64 | Temp 98.2°F | Resp 18 | Ht 66.75 in | Wt 186.1 lb

## 2017-02-21 DIAGNOSIS — L4050 Arthropathic psoriasis, unspecified: Secondary | ICD-10-CM

## 2017-02-21 DIAGNOSIS — R945 Abnormal results of liver function studies: Secondary | ICD-10-CM

## 2017-02-21 DIAGNOSIS — K219 Gastro-esophageal reflux disease without esophagitis: Secondary | ICD-10-CM

## 2017-02-21 DIAGNOSIS — I10 Essential (primary) hypertension: Secondary | ICD-10-CM | POA: Diagnosis not present

## 2017-02-21 DIAGNOSIS — N183 Chronic kidney disease, stage 3 unspecified: Secondary | ICD-10-CM

## 2017-02-21 DIAGNOSIS — Z0000 Encounter for general adult medical examination without abnormal findings: Secondary | ICD-10-CM | POA: Diagnosis not present

## 2017-02-21 DIAGNOSIS — D649 Anemia, unspecified: Secondary | ICD-10-CM

## 2017-02-21 DIAGNOSIS — I2510 Atherosclerotic heart disease of native coronary artery without angina pectoris: Secondary | ICD-10-CM

## 2017-02-21 DIAGNOSIS — L989 Disorder of the skin and subcutaneous tissue, unspecified: Secondary | ICD-10-CM

## 2017-02-21 DIAGNOSIS — R1310 Dysphagia, unspecified: Secondary | ICD-10-CM

## 2017-02-21 DIAGNOSIS — E0822 Diabetes mellitus due to underlying condition with diabetic chronic kidney disease: Secondary | ICD-10-CM

## 2017-02-21 DIAGNOSIS — R7989 Other specified abnormal findings of blood chemistry: Secondary | ICD-10-CM

## 2017-02-21 DIAGNOSIS — R1319 Other dysphagia: Secondary | ICD-10-CM

## 2017-02-21 LAB — HM DIABETES FOOT EXAM

## 2017-02-21 NOTE — Assessment & Plan Note (Signed)
Physical today 02/21/17.

## 2017-02-21 NOTE — Progress Notes (Signed)
Patient ID: Shane Mcknight, male   DOB: December 04, 1950, 67 y.o.   MRN: [id number]   Subjective:    Patient ID: Shane Mcknight, male    DOB: 02/24/1950, 67 y.o.   MRN: [id number]  HPI  Patient here for his physical exam.  He reports he is doing relatively well.  Had problems with swallowing.  Recent evaluation by GI - EGD - monilial esophagus and esophageal stenosis.  Eating soft foods, liquid diet and starting to advance diet.  No chest pain.  No sob.  No abdominal pain.  Bowels moving.  Persistent lesion on ankle. Discussed referral back to dermatology.  He wants to hold on referral.  Blood pressures averaging 110-120/60.     Past Medical History:  Diagnosis Date  . Abnormal liver function tests   . Allergic state   . Arthritis    spine, hands, shoulder with previous cuff tear  . CAD (coronary artery disease)    a.  11/2015 CT Chest/Abd: Coronray Ca2+ noted;  b. MV: EF 45-54%, med defect of mod severity in basal infsept, basal inf, mid infsept, and mid inf region-> infarct and peri-infarct ischemia-->intermediate risk; c. 05/2016 Cath/PCI: LM 40ost, LAD 30p, 91m D1 60ost, LCX 60ost, OM1 99 (small->Med Rx), RCA 60p, 95d (2.5x15 Resolute Onyx DES), RPDA/RPLB min irregs, D2 60, EF 55-65%/  . Colon polyp   . DDD (degenerative disc disease), lumbosacral   . Dysphagia   . Elevated transaminase level   . Esophageal stricture    a. s/p multiple dilations - last 03/2016.  . Ganglion cyst of finger of right hand   . GERD (gastroesophageal reflux disease)   . Guaiac positive stools   . Hiatal hernia   . HNP (herniated nucleus pulposus), lumbar   . Hypercholesterolemia   . Hypertension   . Lumbar radiculitis   . Mallory-Weiss tear    a. 11/2015.  . Migraine aura without headache   . [place]   . Sleep apnea   . [place]    Past Surgical History:  Procedure Laterality Date  . ARTHROPLASTY     right thumb; left thumb  . BALLOON DILATION N/A 05/29/2015   Procedure: BALLOON DILATION;  Surgeon: MLollie Sails MD;  [facility];  Service: Endoscopy;  Laterality: N/A;  . BALLOON DILATION N/A 02/14/2017   Procedure: BALLOON DILATION;  Surgeon: SLollie Sails MD;  [facility];  Service: Endoscopy;  Laterality: N/A;  . BLEPHAROPLASTY    . CARPAL TUNNEL RELEASE     right  . CHOLECYSTECTOMY    . COLONOSCOPY    . CORONARY STENT INTERVENTION N/A 06/14/2016   Procedure: Coronary Stent Intervention;  Surgeon: AWellington Hampshire MD;  [facility];  Service: Cardiovascular;  Laterality: N/A;  . CYST EXCISION  04/14/2015   tendon sheath cyst excision; right ring finger cyst removed and trigger finger realease  . ESOPHAGOGASTRODUODENOSCOPY    . ESOPHAGOGASTRODUODENOSCOPY (EGD) WITH PROPOFOL N/A 05/29/2015   Procedure: ESOPHAGOGASTRODUODENOSCOPY (EGD) WITH PROPOFOL;  Surgeon: MLollie Sails MD;  [facility];  Service: Endoscopy;  Laterality: N/A;  . ESOPHAGOGASTRODUODENOSCOPY (EGD) WITH PROPOFOL N/A 07/04/2015   Procedure: ESOPHAGOGASTRODUODENOSCOPY (EGD) WITH PROPOFOL;  Surgeon: MLollie Sails MD;  [facility];  Service: Endoscopy;  Laterality: N/A;  . ESOPHAGOGASTRODUODENOSCOPY (EGD) WITH PROPOFOL N/A 01/04/2016   Procedure: ESOPHAGOGASTRODUODENOSCOPY (EGD) WITH PROPOFOL;  Surgeon: MLollie Sails MD;  [facility];  Service: Endoscopy;  Laterality: N/A;  .  ESOPHAGOGASTRODUODENOSCOPY (EGD) WITH PROPOFOL N/A 03/22/2016   Procedure: ESOPHAGOGASTRODUODENOSCOPY (EGD) WITH PROPOFOL;  Surgeon: Lollie Sails, MD;  [facility];  Service: Endoscopy;  Laterality: N/A;  . ESOPHAGOGASTRODUODENOSCOPY (EGD) WITH PROPOFOL N/A 02/14/2017   Procedure: ESOPHAGOGASTRODUODENOSCOPY (EGD) WITH PROPOFOL;  Surgeon: Lollie Sails, MD;  [facility];  Service: Endoscopy;  Laterality: N/A;  . EYE SURGERY    . FOREIGN BODY REMOVAL N/A 12/03/2015   Procedure:  FOREIGN BODY REMOVAL;  Surgeon: Wilford Corner, MD;  [facility];  Service: Endoscopy;  Laterality: N/A;  . HERNIA REPAIR    . LEFT HEART CATH Left 06/14/2016   Procedure: Left Heart Cath;  Surgeon: Wellington Hampshire, MD;  Location: [place] [facility];  Service: Cardiovascular;  Laterality: Left;  . LUMBAR DISC SURGERY    . NASAL SINUS SURGERY    . ROTATOR CUFF REPAIR    . TONSILLECTOMY     Family History  Problem Relation Age of Onset  . Lung cancer Father   . Hypertension Mother    Social History   Socioeconomic History  . Marital status: Married    Spouse name: None  . Number of children: None  . Years of education: None  . Highest education level: None  Social Needs  . Financial resource strain: None  . Food insecurity - worry: None  . Food insecurity - inability: None  . Transportation needs - medical: None  . Transportation needs - non-medical: None  Occupational History  . None  Tobacco Use  . Smoking status: Former Smoker    Last attempt to quit: 07/06/1982    Years since quitting: 34.6  . Smokeless tobacco: Never Used  Substance and Sexual Activity  . Alcohol use: Yes    Alcohol/week: 0.0 oz    Comment: occasional drink.  . Drug use: No  . Sexual activity: None  Other Topics Concern  . None  Social History Narrative   Lives locally with wife.  Active but does not routinely exercise.    Outpatient Encounter Medications as of 02/21/2017  Medication Sig  . aspirin EC 81 MG tablet Take 81 mg by mouth daily.  Marland Kitchen atenolol (TENORMIN) 50 MG tablet TAKE 1 TABLET(50 MG) BY MOUTH TWICE DAILY  . COSENTYX SENSOREADY PEN 150 MG/ML SOAJ Inject 1 Dose into the skin every 30 (thirty) days. Due 32/44/0102  . folic acid (FOLVITE) 1 MG tablet Take 1 mg by mouth daily.  Marland Kitchen gabapentin (NEURONTIN) 300 MG capsule TAKE 1 CAPSULE(300 MG) BY MOUTH TWICE DAILY  . hydrochlorothiazide (HYDRODIURIL) 25 MG tablet TAKE 1 TABLET BY MOUTH EVERY DAY  . losartan (COZAAR) 50 MG  tablet TAKE 1 TABLET(50 MG) BY MOUTH DAILY. FOLLOW UP APPOINTMENT.THANKS  . metFORMIN (GLUCOPHAGE) 500 MG tablet TAKE 4 TABLETS BY MOUTH EVERY EVENING.  . Multiple Vitamin (MULTIVITAMIN) tablet Take 1 tablet by mouth daily.  . nitroGLYCERIN (NITROSTAT) 0.4 MG SL tablet Place 1 tablet (0.4 mg total) under the tongue every 5 (five) minutes as needed.  . pantoprazole (PROTONIX) 40 MG tablet Take 1 tablet (40 mg total) by mouth 2 (two) times daily.  . prasugrel (EFFIENT) 10 MG TABS tablet TAKE 1 TABLET(10 MG) BY MOUTH DAILY  . rosuvastatin (CRESTOR) 20 MG tablet TAKE 1 TABLET BY MOUTH EVERY DAY AT 6 PM  . vitamin E 100 UNIT capsule Take 400 Units by mouth daily.  . [DISCONTINUED] hydrochlorothiazide (HYDRODIURIL) 25 MG tablet Take 0.5 tablets (12.5 mg total) by mouth daily.  . [DISCONTINUED] isosorbide  mononitrate (IMDUR) 30 MG 24 hr tablet Take 1 tablet (30 mg total) by mouth daily. (Patient taking differently: Take 30 mg by mouth daily as needed. )  . [DISCONTINUED] prasugrel (EFFIENT) 10 MG TABS tablet Take 10 mg by mouth daily.  . [DISCONTINUED] saccharomyces boulardii (FLORASTOR) 250 MG capsule Take 250 mg by mouth 2 (two) times daily.  . [DISCONTINUED] traMADol (ULTRAM) 50 MG tablet Take 1 tablet (50 mg total) by mouth 2 (two) times daily as needed.   No facility-administered encounter medications on file as of 02/21/2017.     Review of Systems  Constitutional: Negative for appetite change and unexpected weight change.  HENT: Negative for congestion and sinus pressure.   Eyes: Negative for pain and visual disturbance.  Respiratory: Negative for cough, chest tightness and shortness of breath.   Cardiovascular: Negative for chest pain, palpitations and leg swelling.  Gastrointestinal: Negative for abdominal pain, diarrhea, nausea and vomiting.       Swallowing difficulty as outlined.    Genitourinary: Negative for difficulty urinating and frequency.  Musculoskeletal: Negative for back pain  and myalgias.  Skin: Negative for color change and rash.  Neurological: Negative for dizziness, light-headedness and headaches.  Hematological: Negative for adenopathy. Does not bruise/bleed easily.  Psychiatric/Behavioral: Negative for agitation and dysphoric mood.       Objective:     Blood pressure rechecked by me:  114/68  Physical Exam  Constitutional: He is oriented to person, place, and time. He appears well-developed and well-nourished. No distress.  HENT:  Head: Normocephalic and atraumatic.  Nose: Nose normal.  Mouth/Throat: Oropharynx is clear and moist. No oropharyngeal exudate.  Eyes: Conjunctivae are normal. Right eye exhibits no discharge. Left eye exhibits no discharge.  Neck: Neck supple. No thyromegaly present.  Cardiovascular: Normal rate and regular rhythm.  Pulmonary/Chest: Breath sounds normal. No respiratory distress. He has no wheezes.  Abdominal: Soft. Bowel sounds are normal. There is no tenderness.  Genitourinary: Rectum normal and penis normal.  Musculoskeletal: He exhibits no edema or tenderness.  Lymphadenopathy:    He has no cervical adenopathy.  Neurological: He is alert and oriented to person, place, and time.  Skin: Skin is warm and dry. No erythema.  Circular lesion - left medial ankle.  Feet:  Sensation intact to light touch, pin prick.  No lesions.    Psychiatric: He has a normal mood and affect. His behavior is normal.    BP 120/78   Pulse 64   Temp 98.2 F (36.8 C) (Oral)   Resp 18   Ht 5' 6.75" (1.695 m)   Wt 186 lb 2 oz (84.4 kg)   SpO2 97%   BMI 29.37 kg/m  Wt Readings from Last 3 Encounters:  02/21/17 186 lb 2 oz (84.4 kg)  02/14/17 185 lb (83.9 kg)  11/15/16 192 lb 6.4 oz (87.3 kg)     Lab Results  Component Value Date   WBC 5.0 11/29/2016   HGB 12.7 (L) 11/29/2016   HCT 38.8 (L) 11/29/2016   PLT 207.0 11/29/2016   GLUCOSE 118 (H) 11/29/2016   CHOL 92 11/29/2016   TRIG 105.0 11/29/2016   HDL 30.90 (L) 11/29/2016    LDLCALC 40 11/29/2016   ALT 15 11/29/2016   AST 16 11/29/2016   NA 142 11/29/2016   K 3.7 11/29/2016   CL 101 11/29/2016   CREATININE 0.90 11/29/2016   BUN 13 11/29/2016   CO2 31 11/29/2016   TSH 1.53 11/29/2016   PSA 2.35 11/29/2016  INR 1.1 06/07/2016   HGBA1C 6.6 (H) 11/29/2016   MICROALBUR 4.4 (H) 11/29/2016       Assessment & Plan:   Problem List Items Addressed This Visit    Abnormal liver function tests    Follow liver panel.  Diet and exercise.        Relevant Orders   Hepatic function panel   Anemia    Follow cbc.  Seeing GI.  Undergoing w/up.       Relevant Orders   CBC with Differential/Platelet   Ferritin   CAD (coronary artery disease)    S/p stent RCA.  On effient.  Followed by cardiology.  Stable.  Continue risk factor modification.        Diabetes mellitus ([place])    Low carb diet and exercise.  Follow met b and a1c.        Relevant Orders   Hemoglobin A1c   Lipid panel   Dysphagia    Seeing GI.  Recent EGD as outlined.  Eating.  Keep f/u with GI.       GERD (gastroesophageal reflux disease)    Controlled on protonix.        Health care maintenance    Physical today 02/21/17.        Hypertension    Blood pressure under good control.  Continue same medication regimen.  Follow pressures.  Follow metabolic panel.        Relevant Orders   Basic metabolic panel   Psoriatic arthritis ([place])    Followed by rheumatology.        Skin lesion    Persistent ankle lesion.  Discussed referral to dermatology.  He declines.         Other Visit Diagnoses    Routine general medical examination at a health care facility    -  Primary       Einar Pheasant, MD

## 2017-02-24 ENCOUNTER — Encounter: Payer: Self-pay | Admitting: Internal Medicine

## 2017-02-24 NOTE — Assessment & Plan Note (Signed)
Followed by rheumatology. 

## 2017-02-24 NOTE — Assessment & Plan Note (Signed)
Persistent ankle lesion.  Discussed referral to dermatology.  He declines.

## 2017-02-24 NOTE — Assessment & Plan Note (Signed)
S/p stent RCA.  On effient.  Followed by cardiology.  Stable.  Continue risk factor modification.

## 2017-02-24 NOTE — Assessment & Plan Note (Signed)
Low carb diet and exercise.  Follow met b and a1c.   

## 2017-02-24 NOTE — Assessment & Plan Note (Signed)
Seeing GI.  Recent EGD as outlined.  Eating.  Keep f/u with GI.

## 2017-02-24 NOTE — Assessment & Plan Note (Signed)
Blood pressure under good control.  Continue same medication regimen.  Follow pressures.  Follow metabolic panel.   

## 2017-02-24 NOTE — Assessment & Plan Note (Signed)
Follow liver panel.  Diet and exercise.   

## 2017-02-24 NOTE — Assessment & Plan Note (Signed)
Controlled on protonix.   

## 2017-02-24 NOTE — Assessment & Plan Note (Signed)
Follow cbc.  Seeing GI.  Undergoing w/up.

## 2017-02-26 ENCOUNTER — Encounter: Payer: Self-pay | Admitting: Internal Medicine

## 2017-02-26 DIAGNOSIS — R200 Anesthesia of skin: Secondary | ICD-10-CM | POA: Insufficient documentation

## 2017-03-04 ENCOUNTER — Other Ambulatory Visit: Payer: Self-pay | Admitting: Internal Medicine

## 2017-03-21 ENCOUNTER — Ambulatory Visit (INDEPENDENT_AMBULATORY_CARE_PROVIDER_SITE_OTHER): Payer: Medicare Other

## 2017-03-21 ENCOUNTER — Other Ambulatory Visit (INDEPENDENT_AMBULATORY_CARE_PROVIDER_SITE_OTHER): Payer: Medicare Other

## 2017-03-21 DIAGNOSIS — E0822 Diabetes mellitus due to underlying condition with diabetic chronic kidney disease: Secondary | ICD-10-CM | POA: Diagnosis not present

## 2017-03-21 DIAGNOSIS — R7989 Other specified abnormal findings of blood chemistry: Secondary | ICD-10-CM

## 2017-03-21 DIAGNOSIS — I6529 Occlusion and stenosis of unspecified carotid artery: Secondary | ICD-10-CM | POA: Diagnosis not present

## 2017-03-21 DIAGNOSIS — I10 Essential (primary) hypertension: Secondary | ICD-10-CM

## 2017-03-21 DIAGNOSIS — R945 Abnormal results of liver function studies: Secondary | ICD-10-CM

## 2017-03-21 DIAGNOSIS — N183 Chronic kidney disease, stage 3 unspecified: Secondary | ICD-10-CM

## 2017-03-21 DIAGNOSIS — D649 Anemia, unspecified: Secondary | ICD-10-CM

## 2017-03-21 LAB — CBC WITH DIFFERENTIAL/PLATELET
BASOS PCT: 0.5 % (ref 0.0–3.0)
Basophils Absolute: 0 10*3/uL (ref 0.0–0.1)
EOS ABS: 1.2 10*3/uL — AB (ref 0.0–0.7)
Eosinophils Relative: 19.3 % — ABNORMAL HIGH (ref 0.0–5.0)
HEMATOCRIT: 37.9 % — AB (ref 39.0–52.0)
Hemoglobin: 12.9 g/dL — ABNORMAL LOW (ref 13.0–17.0)
LYMPHS PCT: 23.4 % (ref 12.0–46.0)
Lymphs Abs: 1.5 10*3/uL (ref 0.7–4.0)
MCHC: 34.1 g/dL (ref 30.0–36.0)
MCV: 83.2 fl (ref 78.0–100.0)
MONO ABS: 0.5 10*3/uL (ref 0.1–1.0)
Monocytes Relative: 8.3 % (ref 3.0–12.0)
NEUTROS ABS: 3.1 10*3/uL (ref 1.4–7.7)
Neutrophils Relative %: 48.5 % (ref 43.0–77.0)
PLATELETS: 198 10*3/uL (ref 150.0–400.0)
RBC: 4.56 Mil/uL (ref 4.22–5.81)
RDW: 14.8 % (ref 11.5–15.5)
WBC: 6.4 10*3/uL (ref 4.0–10.5)

## 2017-03-21 LAB — BASIC METABOLIC PANEL
BUN: 13 mg/dL (ref 6–23)
CHLORIDE: 100 meq/L (ref 96–112)
CO2: 31 mEq/L (ref 19–32)
CREATININE: 0.91 mg/dL (ref 0.40–1.50)
Calcium: 9.8 mg/dL (ref 8.4–10.5)
GFR: 87.94 mL/min (ref >60.00)
GLUCOSE: 144 mg/dL — AB (ref 70–99)
Potassium: 3.7 mEq/L (ref 3.5–5.1)
Sodium: 139 mEq/L (ref 135–145)

## 2017-03-21 LAB — LIPID PANEL
CHOLESTEROL: 107 mg/dL (ref 0–200)
HDL: 37 mg/dL — ABNORMAL LOW (ref >39.00)
LDL CALC: 44 mg/dL (ref 0–99)
NONHDL: 70.45
Total CHOL/HDL Ratio: 3
Triglycerides: 130 mg/dL (ref 0.0–149.0)
VLDL: 26 mg/dL (ref 0.0–40.0)

## 2017-03-21 LAB — HEPATIC FUNCTION PANEL
ALT: 15 U/L (ref 0–53)
AST: 13 U/L (ref 0–37)
Albumin: 4.3 g/dL (ref 3.5–5.2)
Alkaline Phosphatase: 41 U/L (ref 39–117)
BILIRUBIN DIRECT: 0.1 mg/dL (ref 0.0–0.3)
BILIRUBIN TOTAL: 0.4 mg/dL (ref 0.2–1.2)
Total Protein: 7.7 g/dL (ref 6.0–8.3)

## 2017-03-21 LAB — HEMOGLOBIN A1C: Hgb A1c MFr Bld: 6.7 % — ABNORMAL HIGH (ref 4.6–6.5)

## 2017-03-21 LAB — FERRITIN: FERRITIN: 8.3 ng/mL — AB (ref 22.0–322.0)

## 2017-04-07 ENCOUNTER — Encounter: Payer: Self-pay | Admitting: Internal Medicine

## 2017-04-08 ENCOUNTER — Telehealth: Payer: Self-pay

## 2017-04-08 ENCOUNTER — Ambulatory Visit (INDEPENDENT_AMBULATORY_CARE_PROVIDER_SITE_OTHER): Payer: Medicare Other | Admitting: Internal Medicine

## 2017-04-08 ENCOUNTER — Encounter: Payer: Self-pay | Admitting: Internal Medicine

## 2017-04-08 DIAGNOSIS — I10 Essential (primary) hypertension: Secondary | ICD-10-CM | POA: Diagnosis not present

## 2017-04-08 DIAGNOSIS — K219 Gastro-esophageal reflux disease without esophagitis: Secondary | ICD-10-CM | POA: Diagnosis not present

## 2017-04-08 DIAGNOSIS — N183 Chronic kidney disease, stage 3 unspecified: Secondary | ICD-10-CM

## 2017-04-08 DIAGNOSIS — J0110 Acute frontal sinusitis, unspecified: Secondary | ICD-10-CM

## 2017-04-08 DIAGNOSIS — E0822 Diabetes mellitus due to underlying condition with diabetic chronic kidney disease: Secondary | ICD-10-CM | POA: Diagnosis not present

## 2017-04-08 MED ORDER — DOXYCYCLINE HYCLATE 100 MG PO TABS: 100.0000 mg | ORAL_TABLET | Freq: Two times a day (BID) | ORAL | 0 refills | Status: DC

## 2017-04-08 NOTE — Progress Notes (Signed)
Patient ID: Shane Mcknight, male   DOB: December 11, 1950, 67 y.o.   MRN: [id number]   Subjective:    Patient ID: Shane Mcknight, male    DOB: [birth year], 67 y.o.   MRN: [id number]  HPI  Patient here as a work in with concerns regarding increased cough and congestion.  States symptoms have been present over the last couple of weeks.  Increased cough and congestion.  Tmax 100.7.  Some increased sinus pressure and left ear hurts.  Increased nasal congestion - productive of green colored mucus.  Noticed some increased drainage with cough productive of colored (green and bloody mucus).  States mucus draining down and then coughing out.  No hemoptysis.  No sob.  Some decreased appetite.  No vomiting.  Previous diarrhea.  Has been taking tussin, tylenol and using saline nasal spray.     Past Medical History:  Diagnosis Date  . Abnormal liver function tests   . Allergic state   . Arthritis    spine, hands, shoulder with previous cuff tear  . CAD (coronary artery disease)    a.  11/2015 CT Chest/Abd: Coronray Ca2+ noted;  b. MV: EF 45-54%, med defect of mod severity in basal infsept, basal inf, mid infsept, and mid inf region-> infarct and peri-infarct ischemia-->intermediate risk; c. 05/2016 Cath/PCI: LM 40ost, LAD 30p, 56m D1 60ost, LCX 60ost, OM1 99 (small->Med Rx), RCA 60p, 95d (2.5x15 Resolute Onyx DES), RPDA/RPLB min irregs, D2 60, EF 55-65%/  . Colon polyp   . DDD (degenerative disc disease), lumbosacral   . Dysphagia   . Elevated transaminase level   . Esophageal stricture    a. s/p multiple dilations - last 03/2016.  . Ganglion cyst of finger of right hand   . GERD (gastroesophageal reflux disease)   . Guaiac positive stools   . Hiatal hernia   . HNP (herniated nucleus pulposus), lumbar   . Hypercholesterolemia   . Hypertension   . Lumbar radiculitis   . Mallory-Weiss tear    a. 11/2015.  . Migraine aura without headache    migraine, visual  . [place]   . Sleep apnea    does not uses CPAP  . Type II diabetes mellitus ([place]    Past Surgical History:  Procedure Laterality Date  . ARTHROPLASTY     right thumb; left thumb  . BALLOON DILATION N/A 05/29/2015   Procedure: BALLOON DILATION;  Surgeon: MLollie Sails MD;  [facility]  Service: Endoscopy;  Laterality: N/A;  . BALLOON DILATION N/A 02/14/2017   Procedure: BALLOON DILATION;  Surgeon: SLollie Sails MD;  [facility];  Service: Endoscopy;  Laterality: N/A;  . BLEPHAROPLASTY    . CARDIAC CATHETERIZATION     1 stent   . CARPAL TUNNEL RELEASE     right  . CHOLECYSTECTOMY    . COLONOSCOPY    . CORONARY STENT INTERVENTION N/A 06/14/2016   Procedure: Coronary Stent Intervention;  Surgeon: AWellington Hampshire MD;  [facility];  Service: Cardiovascular;  Laterality: N/A;  . CYST EXCISION  04/14/2015   tendon sheath cyst excision; right ring finger cyst removed and trigger finger realease  . ESOPHAGOGASTRODUODENOSCOPY    . ESOPHAGOGASTRODUODENOSCOPY (EGD) WITH PROPOFOL N/A 05/29/2015   Procedure: ESOPHAGOGASTRODUODENOSCOPY (EGD) WITH PROPOFOL;  Surgeon: MLollie Sails MD;  [facility];  Service: Endoscopy;  Laterality: N/A;  . ESOPHAGOGASTRODUODENOSCOPY (EGD) WITH PROPOFOL N/A 07/04/2015   Procedure: ESOPHAGOGASTRODUODENOSCOPY (EGD) WITH PROPOFOL;  Surgeon: MBillie Ruddy  Gustavo Lah, MD;  Location: [facility] ENDOSCOPY;  Service: Endoscopy;  Laterality: N/A;  . ESOPHAGOGASTRODUODENOSCOPY (EGD) WITH PROPOFOL N/A 01/04/2016   Procedure: ESOPHAGOGASTRODUODENOSCOPY (EGD) WITH PROPOFOL;  Surgeon: Lollie Sails, MD;  [facility]COPY;  Service: Endoscopy;  Laterality: N/A;  . ESOPHAGOGASTRODUODENOSCOPY (EGD) WITH PROPOFOL N/A 03/22/2016   Procedure: ESOPHAGOGASTRODUODENOSCOPY (EGD) WITH PROPOFOL;  Surgeon: Lollie Sails, MD;  Location: [facility] ENDOSCOPY;  Service: Endoscopy;  Laterality: N/A;  . ESOPHAGOGASTRODUODENOSCOPY (EGD) WITH PROPOFOL N/A 02/14/2017     Procedure: ESOPHAGOGASTRODUODENOSCOPY (EGD) WITH PROPOFOL;  Surgeon: Lollie Sails, MD;  Location: [facility] ENDOSCOPY;  Service: Endoscopy;  Laterality: N/A;  . EYE SURGERY    . FOREIGN BODY REMOVAL N/A 12/03/2015   Procedure: FOREIGN BODY REMOVAL;  Surgeon: Wilford Corner, MD;  Location[facility] END[facility]COPY;  Service: Endoscopy;  Laterality: N/A;  . HERNIA REPAIR    . LEFT HEART CATH Left 06/14/2016   Procedure: Left Heart Cath;  Surgeon: Wellington Hampshire, MD;  Location: [place] CV LAB;  Service: Cardiovascular;  Laterality: Left;  . LUMBAR DISC SURGERY    . NASAL SINUS SURGERY    . ROTATOR CUFF REPAIR    . TONSILLECTOMY     Family History  Problem Relation Age of Onset  . Lung cancer Father   . Cancer Father   . Hypertension Mother   . Aneurysm Son    Social History   Socioeconomic History  . Marital status: Married    Spouse name: Not on file  . Number of children: Not on file  . Years of education: Not on file  . Highest education level: Not on file  Occupational History  . Not on file  Social Needs  . Financial resource strain: Not on file  . Food insecurity:    Worry: Not on file    Inability: Not on file  . Transportation needs:    Medical: Not on file    Non-medical: Not on file  Tobacco Use  . Smoking status: Former Smoker    Last attempt to quit: 07/06/1982    Years since quitting: 34.7  . Smokeless tobacco: Never Used  Substance and Sexual Activity  . Alcohol use: Yes    Alcohol/week: 0.0 oz    Comment: occasional drink= 1-2 times a year  . Drug use: No  . Sexual activity: Not on file  Lifestyle  . Physical activity:    Days per week: Not on file    Minutes per session: Not on file  . Stress: Not on file  Relationships  . Social connections:    Talks on phone: Not on file    Gets together: Not on file    Attends religious service: Not on file    Active member of club or organization: Not on file    Attends meetings of clubs or organizations:  Not on file    Relationship status: Not on file  Other Topics Concern  . Not on file  Social History Narrative   Lives locally with wife.  Active but does not routinely exercise.    Outpatient Encounter Medications as of 04/08/2017  Medication Sig  . aspirin EC 81 MG tablet Take 81 mg by mouth daily.  Marland Kitchen atenolol (TENORMIN) 50 MG tablet TAKE 1 TABLET(50 MG) BY MOUTH TWICE DAILY  . COSENTYX SENSOREADY PEN 150 MG/ML SOAJ Inject 1 Dose into the skin every 30 (thirty) days. Due 84/69/6295  . folic acid (FOLVITE) 1 MG tablet Take 1 mg by mouth daily.  Marland Kitchen  gabapentin (NEURONTIN) 300 MG capsule TAKE 1 CAPSULE(300 MG) BY MOUTH TWICE DAILY  . hydrochlorothiazide (HYDRODIURIL) 25 MG tablet TAKE 1 TABLET BY MOUTH EVERY DAY  . losartan (COZAAR) 50 MG tablet TAKE 1 TABLET(50 MG) BY MOUTH DAILY. FOLLOW UP APPOINTMENT.THANKS  . metFORMIN (GLUCOPHAGE) 500 MG tablet TAKE 4 TABLETS BY MOUTH EVERY EVENING. (Patient taking differently: Takes 2 tablets in am and 2 tablets in pm)  . Multiple Vitamin (MULTIVITAMIN) tablet Take 1 tablet by mouth daily.  . nitroGLYCERIN (NITROSTAT) 0.4 MG SL tablet Place 1 tablet (0.4 mg total) under the tongue every 5 (five) minutes as needed.  . pantoprazole (PROTONIX) 40 MG tablet Take 1 tablet (40 mg total) by mouth 2 (two) times daily.  . prasugrel (EFFIENT) 10 MG TABS tablet TAKE 1 TABLET(10 MG) BY MOUTH DAILY  . rosuvastatin (CRESTOR) 20 MG tablet TAKE 1 TABLET BY MOUTH EVERY DAY AT 6 PM  . vitamin E 100 UNIT capsule Take 400 Units by mouth daily.  Marland Kitchen doxycycline (VIBRA-TABS) 100 MG tablet Take 1 tablet (100 mg total) by mouth 2 (two) times daily. (Patient not taking: Reported on 04/11/2017)   No facility-administered encounter medications on file as of 04/08/2017.     Review of Systems  Constitutional: Positive for fever.       Decreased appetite.    HENT: Positive for congestion, postnasal drip and sinus pressure.   Respiratory: Positive for cough. Negative for chest  tightness and shortness of breath.   Cardiovascular: Negative for chest pain and leg swelling.  Gastrointestinal: Negative for abdominal pain, nausea and vomiting.       Some previous diarrhea.    Musculoskeletal: Negative for joint swelling and myalgias.  Skin: Negative for color change and rash.  Neurological: Negative for dizziness, light-headedness and headaches.  Psychiatric/Behavioral: Negative for agitation and dysphoric mood.       Objective:    Physical Exam  Constitutional: He appears well-developed and well-nourished. No distress.  Cardiovascular: Normal rate and regular rhythm.  Pulmonary/Chest: Effort normal and breath sounds normal. No respiratory distress.  Abdominal: Soft. Bowel sounds are normal. There is no tenderness.  Musculoskeletal: He exhibits no edema.  Psychiatric: He has a normal mood and affect. His behavior is normal.    BP (!) 142/88 (BP Location: Left Arm, Patient Position: Sitting, Cuff Size: Normal)   Pulse (!) 54   Temp 98.4 F (36.9 C) (Oral)   Ht '5\' 9"'  (1.753 m)   Wt 189 lb 2 oz (85.8 kg)   SpO2 98%   BMI 27.93 kg/m  Wt Readings from Last 3 Encounters:  04/11/17 187 lb (84.8 kg)  04/08/17 189 lb 2 oz (85.8 kg)  02/21/17 186 lb 2 oz (84.4 kg)     Lab Results  Component Value Date   WBC 6.4 03/21/2017   HGB 12.9 (L) 03/21/2017   HCT 37.9 (L) 03/21/2017   PLT 198.0 03/21/2017   GLUCOSE 144 (H) 03/21/2017   CHOL 107 03/21/2017   TRIG 130.0 03/21/2017   HDL 37.00 (L) 03/21/2017   LDLCALC 44 03/21/2017   ALT 15 03/21/2017   AST 13 03/21/2017   NA 139 03/21/2017   K 3.7 03/21/2017   CL 100 03/21/2017   CREATININE 0.91 03/21/2017   BUN 13 03/21/2017   CO2 31 03/21/2017   TSH 1.53 11/29/2016   PSA 2.35 11/29/2016   INR 1.1 06/07/2016   HGBA1C 6.7 (H) 03/21/2017   MICROALBUR 4.4 (H) 11/29/2016       Assessment &  Plan:   Problem List Items Addressed This Visit    Diabetes mellitus ([place])    Low carb diet and exercise.  Follow  met b and a1c.       GERD (gastroesophageal reflux disease)    Controlled on protonix.  Planning for EGD.  He will notify GI of his current infection and treatment.        Hypertension    Blood pressure has been under good control.  Continue current medication regimen.  Follow pressures.  Follow metabolic panel.        Sinusitis    Symptoms appear to be c/w sinus infection/URI.  Treat with doxycycline.  Probiotic as directed.  Saline nasal spry and nasacort nasal spray as directed.  Mucinex/robitussin as directed.  Follow.        Relevant Medications   doxycycline (VIBRA-TABS) 100 MG tablet       Einar Pheasant, MD

## 2017-04-08 NOTE — Patient Instructions (Signed)
Saline nasal spray - flush nose at least 2-3x/day  nasacort nasal spray - 2 sprays each nostril one time per day.  Do this in the evening.    Take a probiotic daily while you are on the antibiotics and for two weeks after completing the antibiotics.

## 2017-04-08 NOTE — Telephone Encounter (Signed)
Reason for call: cough , drainage yellow in color  Symptoms: cough, drainage, low grade fever 100.5  Duration Saturday  Medications: Tussin CF, Now Tussin DM Last seen for this problem: Seen by:N/a [phone]

## 2017-04-08 NOTE — Telephone Encounter (Signed)
Scheduled with dr. Nicki Reaper

## 2017-04-08 NOTE — Progress Notes (Signed)
Pre-visit discussion using our clinic review tool. No additional management support is needed unless otherwise documented below in the visit note.  

## 2017-04-08 NOTE — Telephone Encounter (Signed)
Copied from [place] [id number]. Topic: Appointment Scheduling - Scheduling Inquiry for Clinic >> Apr 08, 2017 10:00 AM Synthia Innocent wrote: Reason for CRM: Would like to be worked in today for sinus congestion, cough and low grade fever. Offered another location, pt declined

## 2017-04-10 ENCOUNTER — Encounter: Payer: Self-pay | Admitting: *Deleted

## 2017-04-11 ENCOUNTER — Ambulatory Visit: Payer: Medicare Other | Admitting: Registered Nurse

## 2017-04-11 ENCOUNTER — Other Ambulatory Visit: Payer: Self-pay | Admitting: Nurse Practitioner

## 2017-04-11 ENCOUNTER — Encounter
Admission: RE | Disposition: A | Discharge status: Home / Self Care | Payer: Self-pay | Source: Ambulatory Visit | Attending: Gastroenterology

## 2017-04-11 ENCOUNTER — Other Ambulatory Visit: Payer: Self-pay

## 2017-04-11 ENCOUNTER — Ambulatory Visit
Admission: RE | Admit: 2017-04-11 | Discharge: 2017-04-11 | Disposition: A | Discharge status: Home / Self Care | Payer: Medicare Other | Source: Ambulatory Visit | Attending: Gastroenterology | Admitting: Gastroenterology

## 2017-04-11 ENCOUNTER — Other Ambulatory Visit: Payer: Self-pay | Admitting: Cardiovascular Disease

## 2017-04-11 ENCOUNTER — Encounter: Payer: Self-pay | Admitting: Internal Medicine

## 2017-04-11 DIAGNOSIS — Z79899 Other long term (current) drug therapy: Secondary | ICD-10-CM | POA: Diagnosis not present

## 2017-04-11 DIAGNOSIS — Z7902 Long term (current) use of antithrombotics/antiplatelets: Secondary | ICD-10-CM | POA: Insufficient documentation

## 2017-04-11 DIAGNOSIS — Z7982 Long term (current) use of aspirin: Secondary | ICD-10-CM | POA: Diagnosis not present

## 2017-04-11 DIAGNOSIS — M1990 Unspecified osteoarthritis, unspecified site: Secondary | ICD-10-CM | POA: Diagnosis not present

## 2017-04-11 DIAGNOSIS — Z888 Allergy status to other drugs, medicaments and biological substances status: Secondary | ICD-10-CM | POA: Insufficient documentation

## 2017-04-11 DIAGNOSIS — I10 Essential (primary) hypertension: Secondary | ICD-10-CM | POA: Insufficient documentation

## 2017-04-11 DIAGNOSIS — R1310 Dysphagia, unspecified: Secondary | ICD-10-CM | POA: Diagnosis not present

## 2017-04-11 DIAGNOSIS — Z7984 Long term (current) use of oral hypoglycemic drugs: Secondary | ICD-10-CM | POA: Diagnosis not present

## 2017-04-11 DIAGNOSIS — K449 Diaphragmatic hernia without obstruction or gangrene: Secondary | ICD-10-CM | POA: Insufficient documentation

## 2017-04-11 DIAGNOSIS — K2210 Ulcer of esophagus without bleeding: Secondary | ICD-10-CM | POA: Diagnosis not present

## 2017-04-11 DIAGNOSIS — K222 Esophageal obstruction: Secondary | ICD-10-CM | POA: Insufficient documentation

## 2017-04-11 DIAGNOSIS — K210 Gastro-esophageal reflux disease with esophagitis: Secondary | ICD-10-CM | POA: Diagnosis not present

## 2017-04-11 DIAGNOSIS — Z955 Presence of coronary angioplasty implant and graft: Secondary | ICD-10-CM | POA: Insufficient documentation

## 2017-04-11 DIAGNOSIS — E119 Type 2 diabetes mellitus without complications: Secondary | ICD-10-CM | POA: Diagnosis not present

## 2017-04-11 DIAGNOSIS — I2510 Atherosclerotic heart disease of native coronary artery without angina pectoris: Secondary | ICD-10-CM | POA: Diagnosis not present

## 2017-04-11 DIAGNOSIS — E7800 Pure hypercholesterolemia, unspecified: Secondary | ICD-10-CM | POA: Diagnosis not present

## 2017-04-11 DIAGNOSIS — K219 Gastro-esophageal reflux disease without esophagitis: Secondary | ICD-10-CM | POA: Insufficient documentation

## 2017-04-11 HISTORY — PX: ESOPHAGOGASTRODUODENOSCOPY (EGD) WITH PROPOFOL: SHX5813

## 2017-04-11 LAB — GLUCOSE, CAPILLARY: GLUCOSE-CAPILLARY: 117 mg/dL — AB (ref 65–99)

## 2017-04-11 SURGERY — ESOPHAGOGASTRODUODENOSCOPY (EGD) WITH PROPOFOL
Anesthesia: General

## 2017-04-11 MED ORDER — LIDOCAINE HCL (PF) 2 % IJ SOLN
INTRAMUSCULAR | Status: AC
  Filled 2017-04-11: qty 10

## 2017-04-11 MED ORDER — SODIUM CHLORIDE 0.9 % IV SOLN: INTRAVENOUS | Status: DC

## 2017-04-11 MED ORDER — GLYCOPYRROLATE 0.2 MG/ML IJ SOLN
INTRAMUSCULAR | Status: DC | PRN
  Administered 2017-04-11: 0.2 mg via INTRAVENOUS

## 2017-04-11 MED ORDER — PROPOFOL 10 MG/ML IV BOLUS
INTRAVENOUS | Status: DC | PRN
  Administered 2017-04-11: 100 mg via INTRAVENOUS

## 2017-04-11 MED ORDER — GLYCOPYRROLATE 0.2 MG/ML IJ SOLN
INTRAMUSCULAR | Status: AC
  Filled 2017-04-11: qty 1

## 2017-04-11 MED ORDER — LIDOCAINE HCL (CARDIAC) 20 MG/ML IV SOLN
INTRAVENOUS | Status: DC | PRN
  Administered 2017-04-11: 100 mg via INTRAVENOUS

## 2017-04-11 MED ORDER — PROPOFOL 500 MG/50ML IV EMUL
INTRAVENOUS | Status: DC | PRN
  Administered 2017-04-11: 200 ug/kg/min via INTRAVENOUS

## 2017-04-11 MED ORDER — SODIUM CHLORIDE 0.9 % IV SOLN
INTRAVENOUS | Status: DC
  Administered 2017-04-11 (×2): via INTRAVENOUS

## 2017-04-11 MED ORDER — PROPOFOL 500 MG/50ML IV EMUL
INTRAVENOUS | Status: AC
Start: 2017-04-11 — End: ?
  Filled 2017-04-11: qty 50

## 2017-04-11 NOTE — Assessment & Plan Note (Signed)
Controlled on protonix.  Planning for EGD.  He will notify GI of his current infection and treatment.

## 2017-04-11 NOTE — Assessment & Plan Note (Signed)
Low carb diet and exercise.  Follow met b and a1c.  

## 2017-04-11 NOTE — Assessment & Plan Note (Signed)
Symptoms appear to be c/w sinus infection/URI.  Treat with doxycycline.  Probiotic as directed.  Saline nasal spry and nasacort nasal spray as directed.  Mucinex/robitussin as directed.  Follow.

## 2017-04-11 NOTE — Anesthesia Post-op Follow-up Note (Signed)
Anesthesia QCDR form completed.        

## 2017-04-11 NOTE — Anesthesia Preprocedure Evaluation (Signed)
Anesthesia Evaluation  Patient identified by MRN, date of birth, ID band Patient awake    Reviewed: Allergy & Precautions, H&P , NPO status , Patient's Chart, lab work & pertinent test results, reviewed documented beta blocker date and time   Airway Mallampati: II   Neck ROM: full    Dental  (+) Teeth Intact   Pulmonary neg pulmonary ROS, sleep apnea , former smoker,    Pulmonary exam normal        Cardiovascular Exercise Tolerance: Poor hypertension, + angina with exertion + CAD  negative cardio ROS Normal cardiovascular exam Rhythm:regular Rate:Normal     Neuro/Psych  Headaches,  Neuromuscular disease negative neurological ROS  negative psych ROS   GI/Hepatic negative GI ROS, Neg liver ROS, hiatal hernia, GERD  Medicated,  Endo/Other  negative endocrine ROSdiabetes  Renal/GU negative Renal ROS  negative genitourinary   Musculoskeletal   Abdominal   Peds  Hematology negative hematology ROS (+) anemia ,   Anesthesia Other Findings Past Medical History: No date: Abnormal liver function tests No date: Allergic state No date: Arthritis     Comment:  spine, hands, shoulder with previous cuff tear No date: CAD (coronary artery disease)     Comment:  a.  11/2015 CT Chest/Abd: Coronray Ca2+ noted;  b. MV:               EF 45-54%, med defect of mod severity in basal infsept,               basal inf, mid infsept, and mid inf region-> infarct and               peri-infarct ischemia-->intermediate risk; c. 05/2016               Cath/PCI: LM 40ost, LAD 30p, 59m, D1 60ost, LCX 60ost,               OM1 99 (small->Med Rx), RCA 60p, 95d (2.5x15 Resolute               Onyx DES), RPDA/RPLB min irregs, D2 60, EF 55-65%/ No date: Colon polyp No date: DDD (degenerative disc disease), lumbosacral No date: Dysphagia No date: Elevated transaminase level No date: Esophageal stricture     Comment:  a. s/p multiple dilations - last  03/2016. No date: Ganglion cyst of finger of right hand No date: GERD (gastroesophageal reflux disease) No date: Guaiac positive stools No date: Hiatal hernia No date: HNP (herniated nucleus pulposus), lumbar No date: Hypercholesterolemia No date: Hypertension No date: Lumbar radiculitis No date: Mallory-Weiss tear     Comment:  a. 11/2015. No date: Migraine aura without headache     Comment:  migraine, visual No date: Psoriatic arthritis ([place]) No date: Sleep apnea     Comment:  does not uses CPAP No date: Type II diabetes mellitus ([place]) Past Surgical History: No date: ARTHROPLASTY     Comment:  right thumb; left thumb 05/29/2015: BALLOON DILATION; N/A     Comment:  Procedure: BALLOON DILATION;  Surgeon: Lollie Sails, MD;  Location: [facility] ENDOSCOPY;  Service:               Endoscopy;  Laterality: N/A; 02/14/2017: BALLOON DILATION; N/A     Comment:  Procedure: BALLOON DILATION;  Surgeon: Lollie Sails, MD;  Location: [facility] ENDOSCOPY;  Service: Endoscopy;                Laterality: N/A; No date: BLEPHAROPLASTY No date: CARDIAC CATHETERIZATION     Comment:  1 stent  No date: CARPAL TUNNEL RELEASE     Comment:  right No date: CHOLECYSTECTOMY No date: COLONOSCOPY 06/14/2016: CORONARY STENT INTERVENTION; N/A     Comment:  Procedure: Coronary Stent Intervention;  Surgeon: Wellington Hampshire, MD;  Location: [place] CV LAB;                Service: Cardiovascular;  Laterality: N/A; 04/14/2015: CYST EXCISION     Comment:  tendon sheath cyst excision; right ring finger cyst               removed and trigger finger realease No date: ESOPHAGOGASTRODUODENOSCOPY 05/29/2015: ESOPHAGOGASTRODUODENOSCOPY (EGD) WITH PROPOFOL; N/A     Comment:  Procedure: ESOPHAGOGASTRODUODENOSCOPY (EGD) WITH               PROPOFOL;  Surgeon: Lollie Sails, MD;  Location:               [facility] ENDOSCOPY;  Service: Endoscopy;  Laterality: N/A; 07/04/2015:  ESOPHAGOGASTRODUODENOSCOPY (EGD) WITH PROPOFOL; N/A     Comment:  Procedure: ESOPHAGOGASTRODUODENOSCOPY (EGD) WITH               PROPOFOL;  Surgeon: Lollie Sails, MD;  Location:               [facility] ENDOSCOPY;  Service: Endoscopy;  Laterality: N/A; 01/04/2016: ESOPHAGOGASTRODUODENOSCOPY (EGD) WITH PROPOFOL; N/A     Comment:  Procedure: ESOPHAGOGASTRODUODENOSCOPY (EGD) WITH               PROPOFOL;  Surgeon: Lollie Sails, MD;  Location:               [facility] ENDOSCOPY;  Service: Endoscopy;  Laterality: N/A; 03/22/2016: ESOPHAGOGASTRODUODENOSCOPY (EGD) WITH PROPOFOL; N/A     Comment:  Procedure: ESOPHAGOGASTRODUODENOSCOPY (EGD) WITH               PROPOFOL;  Surgeon: Lollie Sails, MD;  Location:               [facility] ENDOSCOPY;  Service: Endoscopy;  Laterality: N/A; 02/14/2017: ESOPHAGOGASTRODUODENOSCOPY (EGD) WITH PROPOFOL; N/A     Comment:  Procedure: ESOPHAGOGASTRODUODENOSCOPY (EGD) WITH               PROPOFOL;  Surgeon: Lollie Sails, MD;  Location:               [facility] ENDOSCOPY;  Service: Endoscopy;  Laterality: N/A; No date: EYE SURGERY 12/03/2015: FOREIGN BODY REMOVAL; N/A     Comment:  Procedure: FOREIGN BODY REMOVAL;  Surgeon: Wilford Corner, MD;  Location: [facility] ENDOSCOPY;  Service:               Endoscopy;  Laterality: N/A; No date: HERNIA REPAIR 06/14/2016: LEFT HEART CATH; Left     Comment:  Procedure: Left Heart Cath;  Surgeon: Wellington Hampshire,              MD;  Location: [place] CV LAB;  Service:               Cardiovascular;  Laterality: Left; No date: LUMBAR DISC SURGERY No date: NASAL SINUS SURGERY No date: ROTATOR CUFF REPAIR No  date: TONSILLECTOMY BMI    Body Mass Index:  28.02 kg/m     Reproductive/Obstetrics negative OB ROS                             Anesthesia Physical Anesthesia Plan  ASA: IV  Anesthesia Plan: General   Post-op Pain Management:    Induction:   PONV Risk Score and Plan:   Airway  Management Planned:   Additional Equipment:   Intra-op Plan:   Post-operative Plan:   Informed Consent: I have reviewed the patients History and Physical, chart, labs and discussed the procedure including the risks, benefits and alternatives for the proposed anesthesia with the patient or authorized representative who has indicated his/her understanding and acceptance.   Dental Advisory Given  Plan Discussed with: CRNA  Anesthesia Plan Comments:         Anesthesia Quick Evaluation

## 2017-04-11 NOTE — Assessment & Plan Note (Signed)
Blood pressure has been under good control.  Continue current medication regimen.  Follow pressures.  Follow metabolic panel.  

## 2017-04-11 NOTE — Anesthesia Postprocedure Evaluation (Signed)
Anesthesia Post Note  Patient: Shane Mcknight  Procedure(s) Performed: ESOPHAGOGASTRODUODENOSCOPY (EGD) WITH PROPOFOL (N/A )  Patient location during evaluation: PACU Anesthesia Type: General Level of consciousness: awake and alert Pain management: pain level controlled Vital Signs Assessment: post-procedure vital signs reviewed and stable Respiratory status: spontaneous breathing, nonlabored ventilation, respiratory function stable and patient connected to nasal cannula oxygen Cardiovascular status: blood pressure returned to baseline and stable Postop Assessment: no apparent nausea or vomiting Anesthetic complications: no     Last Vitals:  Vitals:   04/11/17 1034 04/11/17 1044  BP: 93/67 104/66  Pulse: 70 65  Resp: 20 19  Temp:    SpO2: 100% 100%    Last Pain:  Vitals:   04/11/17 1044  TempSrc:   PainSc: 0-No pain                 [name]

## 2017-04-11 NOTE — Op Note (Addendum)
[facility] Gastroenterology Patient Name: Shane Mcknight Procedure Date: 04/11/2017 9:46 AM MRN: [id number] Account #: [id number] Date of Birth: [birth year] Admit Type: Outpatient Age: 67 Room: [facility] ENDO ROOM 3 Gender: Male Note Status: Finalized Procedure:            Upper GI endoscopy Indications:          Dysphagia Providers:            Lollie Sails, MD, Manya Silvas, MD Referring MD:         Einar Pheasant, MD (Referring MD) Medicines:            Monitored Anesthesia Care Complications:        No immediate complications. Procedure:            Pre-Anesthesia Assessment:                       - ASA Grade Assessment: III - A patient with severe                        systemic disease.                       After obtaining informed consent, the endoscope was                        passed under direct vision. Throughout the procedure,                        the patient's blood pressure, pulse, and oxygen                        saturations were monitored continuously. The Endoscope                        was introduced through the mouth, and advanced to the                        third part of duodenum. The patient tolerated the                        procedure well. The upper GI endoscopy was accomplished                        without difficulty. Findings:      LA Grade A (one or more mucosal breaks less than 5 mm, not extending       between tops of 2 mucosal folds) esophagitis with no bleeding was found.      The exam of the esophagus was otherwise normal.      Diffuse minimal erythematous mucosa without bleeding was found in the       gastric body.      The exam of the duodenum was otherwise normal.      The cardia and gastric fundus were normal on retroflexion.      A small hiatal hernia was present.      A low-grade of narrowing Schatzki ring (acquired) was found at the       gastroesophageal junction.The scope passes through this with minimal    difficulty. A guidewire was placed and the scope was withdrawn. Dilation       was performed with a Savary  dilator with no resistance at 48 Fr and 51       Fr and mild resistance at 45 Fr.      Abnormal motility was noted in the lower third of the esophagus. The       cricopharyngeus was normal. There is spasticity of the esophageal body.       Tertiary peristaltic waves are noted. Impression:           - LA Grade A erosive esophagitis.                       - Erythematous mucosa in the gastric body.                       - Small hiatal hernia.                       - Low-grade of narrowing Schatzki ring. Dilated.                       - No specimens collected. Recommendation:       - Use Protonix (pantoprazole) 40 mg PO BID daily.                       - Clear liquid diet today.                       - Full liquid diet for 2 days, then advance as                        tolerated to soft diet for 3 days. Procedure Code(s):    --- Professional ---                       [phone], Esophagogastroduodenoscopy, flexible, transoral;                        with insertion of guide wire followed by passage of                        dilator(s) through esophagus over guide wire Diagnosis Code(s):    --- Professional ---                       K20.8, Other esophagitis                       K31.89, Other diseases of stomach and duodenum                       K44.9, Diaphragmatic hernia without obstruction or                        gangrene                       K22.2, Esophageal obstruction                       R13.10, Dysphagia, unspecified CPT copyright 2016 [employer] Association. All rights reserved. The codes documented in this report are preliminary and upon coder review may  be revised to meet current compliance requirements. Lollie Sails, MD 04/11/2017 10:21:45 AM This report has been signed electronically.  Manya Silvas, MD Number of Addenda: 0 Note Initiated On: 04/11/2017  9:46 AM      [facility]

## 2017-04-11 NOTE — Transfer of Care (Signed)
Immediate Anesthesia Transfer of Care Note  Patient: Shane Mcknight  Procedure(s) Performed: ESOPHAGOGASTRODUODENOSCOPY (EGD) WITH PROPOFOL (N/A )  Patient Location: PACU  Anesthesia Type:General  Level of Consciousness: awake  Airway & Oxygen Therapy: Patient Spontanous Breathing  Post-op Assessment: Report given to RN  Post vital signs: stable  Last Vitals:  Vitals Value Taken Time  BP 95/59 04/11/2017 10:15 AM  Temp 36.1 C 04/11/2017 10:15 AM  Pulse 71 04/11/2017 10:15 AM  Resp 17 04/11/2017 10:15 AM  SpO2 98 % 04/11/2017 10:15 AM    Last Pain:  Vitals:   04/11/17 1015  TempSrc: Tympanic         Complications: No apparent anesthesia complications

## 2017-04-11 NOTE — H&P (Signed)
Outpatient short stay form Pre-procedure 04/11/2017 9:40 AM Shane Sails MD Primary Physician: Dr. Einar Pheasant  Reason for visit: EGD  History of present illness: Patient is a 67 year old male presenting today as above.  He has a long history of dysphagia with recurrent narrowing at the GE junction.  He has been dilated multiple times.  He is presenting today for repeat procedure.  He is still having some issues with dysphagia and however he states he has not had to regurgitate food since his last EGD.  That was done on 02/14/2017 with dilation to a 45 [place] via savory dilation.  Patient patient does take blood thinners due to his history of coronary stents.  Is held Effient for 1 week.  Is continue taking 81 mg aspirin 3 yesterday.    Current Facility-Administered Medications:  .  0.9 %  sodium chloride infusion, , Intravenous, Continuous, Shane Sails, MD, Last Rate: 20 mL/hr at 04/11/17 0850 .  0.9 %  sodium chloride infusion, , Intravenous, Continuous, Shane Sails, MD  Medications Prior to Admission  Medication Sig Dispense Refill Last Dose  . aspirin EC 81 MG tablet Take 81 mg by mouth daily.   04/10/2017 at Unknown time  . atenolol (TENORMIN) 50 MG tablet TAKE 1 TABLET(50 MG) BY MOUTH TWICE DAILY 60 tablet 3 04/11/2017 at 0600  . COSENTYX SENSOREADY PEN 150 MG/ML SOAJ Inject 1 Dose into the skin every 30 (thirty) days. Due 12/16/2015  10 Past Month at Unknown time  . folic acid (FOLVITE) 1 MG tablet Take 1 mg by mouth daily.   04/10/2017 at Unknown time  . gabapentin (NEURONTIN) 300 MG capsule TAKE 1 CAPSULE(300 MG) BY MOUTH TWICE DAILY 60 capsule 3 04/10/2017 at Unknown time  . hydrochlorothiazide (HYDRODIURIL) 25 MG tablet TAKE 1 TABLET BY MOUTH EVERY DAY 30 tablet 3 04/11/2017 at Unknown time  . losartan (COZAAR) 50 MG tablet TAKE 1 TABLET(50 MG) BY MOUTH DAILY. FOLLOW UP APPOINTMENT.THANKS 90 tablet 0 04/11/2017 at 0600  . metFORMIN (GLUCOPHAGE) 500 MG tablet TAKE 4  TABLETS BY MOUTH EVERY EVENING. (Patient taking differently: Takes 2 tablets in am and 2 tablets in pm) 120 tablet 3 04/11/2017 at 0600  . Multiple Vitamin (MULTIVITAMIN) tablet Take 1 tablet by mouth daily.   Past Week at Unknown time  . nitroGLYCERIN (NITROSTAT) 0.4 MG SL tablet Place 1 tablet (0.4 mg total) under the tongue every 5 (five) minutes as needed. 25 tablet 3 Taking  . pantoprazole (PROTONIX) 40 MG tablet Take 1 tablet (40 mg total) by mouth 2 (two) times daily. 60 tablet 0 04/11/2017 at 0600  . rosuvastatin (CRESTOR) 20 MG tablet TAKE 1 TABLET BY MOUTH EVERY DAY AT 6 PM 30 tablet 2 04/10/2017 at Unknown time  . traMADol (ULTRAM) 50 MG tablet Take by mouth 2 (two) times daily as needed.     . vitamin E 100 UNIT capsule Take 400 Units by mouth daily.   04/10/2017 at Unknown time  . dicyclomine (BENTYL) 10 MG capsule Take 10 mg by mouth 4 (four) times daily -  before meals and at bedtime.   Not Taking at Unknown time  . doxycycline (VIBRA-TABS) 100 MG tablet Take 1 tablet (100 mg total) by mouth 2 (two) times daily. (Patient not taking: Reported on 04/11/2017) 20 tablet 0 Completed Course at Unknown time  . Hyoscyamine Sulfate (HYOSCYAMINE PO) Take 0.125 mg by mouth 4 (four) times daily.   Not Taking at Unknown time  . prasugrel (EFFIENT)  10 MG TABS tablet TAKE 1 TABLET(10 MG) BY MOUTH DAILY 30 tablet 3 04/03/2017     Allergies  Allergen Reactions  . Amoxicillin   . Ephedrine Other (See Comments)    pain  . Levsin [Hyoscyamine Sulfate] Other (See Comments)    Urinary retention  . Naprosyn [Naproxen] Other (See Comments)    GI upset   . Penicillins   . Sudafed [Pseudoephedrine Hcl]   . Vancomycin   . Vicodin [Hydrocodone-Acetaminophen]     Other reaction(s): Other (See Comments) GI Upset  . Other Rash    Paper tape  . Shellfish Allergy Rash     Past Medical History:  Diagnosis Date  . Abnormal liver function tests   . Allergic state   . Arthritis    spine, hands, shoulder  with previous cuff tear  . CAD (coronary artery disease)    a.  11/2015 CT Chest/Abd: Coronray Ca2+ noted;  b. MV: EF 45-54%, med defect of mod severity in basal infsept, basal inf, mid infsept, and mid inf region-> infarct and peri-infarct ischemia-->intermediate risk; c. 05/2016 Cath/PCI: LM 40ost, LAD 30p, 48m, D1 60ost, LCX 60ost, OM1 99 (small->Med Rx), RCA 60p, 95d (2.5x15 Resolute Onyx DES), RPDA/RPLB min irregs, D2 60, EF 55-65%/  . Colon polyp   . DDD (degenerative disc disease), lumbosacral   . Dysphagia   . Elevated transaminase level   . Esophageal stricture    a. s/p multiple dilations - last 03/2016.  . Ganglion cyst of finger of right hand   . GERD (gastroesophageal reflux disease)   . Guaiac positive stools   . Hiatal hernia   . HNP (herniated nucleus pulposus), lumbar   . Hypercholesterolemia   . Hypertension   . Lumbar radiculitis   . Mallory-Weiss tear    a. 11/2015.  . Migraine aura without headache    migraine, visual  . Psoriatic arthritis ([place])   . Sleep apnea    does not uses CPAP  . Type II diabetes mellitus ([place])     Review of systems:      Physical Exam    Heart and lungs: Regular rate and rhythm without rub or gallop, lungs are bilaterally clear.    HEENT: Normocephalic atraumatic eyes are anicteric    Other:    Pertinant exam for procedure: Soft nontender nondistended bowel sounds positive normoactive    Planned proceedures: EGD possible dilatation. I have discussed the risks benefits and complications of procedures to include not limited to bleeding, infection, perforation and the risk of sedation and the patient wishes to proceed.    Shane Sails, MD Gastroenterology 04/11/2017  9:40 AM

## 2017-04-14 ENCOUNTER — Encounter: Payer: Self-pay | Admitting: Gastroenterology

## 2017-04-17 ENCOUNTER — Telehealth: Payer: Self-pay | Admitting: Radiology

## 2017-04-17 NOTE — Telephone Encounter (Signed)
Pt coming in for labs tomorrow, please place future orders. Thank you.  

## 2017-04-18 ENCOUNTER — Other Ambulatory Visit (INDEPENDENT_AMBULATORY_CARE_PROVIDER_SITE_OTHER): Payer: Medicare Other

## 2017-04-18 ENCOUNTER — Other Ambulatory Visit: Payer: Self-pay | Admitting: Internal Medicine

## 2017-04-18 DIAGNOSIS — D649 Anemia, unspecified: Secondary | ICD-10-CM | POA: Diagnosis not present

## 2017-04-18 LAB — CBC WITH DIFFERENTIAL/PLATELET
BASOS PCT: 0.2 % (ref 0.0–3.0)
Basophils Absolute: 0 10*3/uL (ref 0.0–0.1)
EOS ABS: 0.6 10*3/uL (ref 0.0–0.7)
Eosinophils Relative: 9.6 % — ABNORMAL HIGH (ref 0.0–5.0)
HEMATOCRIT: 38.8 % — AB (ref 39.0–52.0)
Hemoglobin: 13 g/dL (ref 13.0–17.0)
LYMPHS PCT: 23.5 % (ref 12.0–46.0)
Lymphs Abs: 1.5 10*3/uL (ref 0.7–4.0)
MCHC: 33.7 g/dL (ref 30.0–36.0)
MCV: 83.9 fl (ref 78.0–100.0)
MONO ABS: 0.7 10*3/uL (ref 0.1–1.0)
Monocytes Relative: 10.8 % (ref 3.0–12.0)
NEUTROS ABS: 3.6 10*3/uL (ref 1.4–7.7)
Neutrophils Relative %: 55.9 % (ref 43.0–77.0)
PLATELETS: 193 10*3/uL (ref 150.0–400.0)
RBC: 4.62 Mil/uL (ref 4.22–5.81)
RDW: 16.1 % — AB (ref 11.5–15.5)
WBC: 6.4 10*3/uL (ref 4.0–10.5)

## 2017-04-18 LAB — FERRITIN: Ferritin: 8 ng/mL — ABNORMAL LOW (ref 22.0–322.0)

## 2017-04-18 NOTE — Telephone Encounter (Signed)
Order placed for f/u cbc and ferritin.   

## 2017-04-18 NOTE — Progress Notes (Signed)
Order placed for f/u cbc and ferritin.   

## 2017-04-24 ENCOUNTER — Other Ambulatory Visit: Payer: Self-pay | Admitting: *Deleted

## 2017-04-24 MED ORDER — ROSUVASTATIN CALCIUM 20 MG PO TABS: ORAL_TABLET | ORAL | 0 refills | Status: DC

## 2017-04-25 DIAGNOSIS — E138 Other specified diabetes mellitus with unspecified complications: Secondary | ICD-10-CM | POA: Diagnosis not present

## 2017-04-25 DIAGNOSIS — G5602 Carpal tunnel syndrome, left upper limb: Secondary | ICD-10-CM | POA: Diagnosis not present

## 2017-04-25 DIAGNOSIS — G5601 Carpal tunnel syndrome, right upper limb: Secondary | ICD-10-CM | POA: Diagnosis not present

## 2017-05-22 ENCOUNTER — Other Ambulatory Visit: Payer: Self-pay | Admitting: Nurse Practitioner

## 2017-06-02 ENCOUNTER — Other Ambulatory Visit: Payer: Self-pay

## 2017-06-02 MED ORDER — LOSARTAN POTASSIUM 50 MG PO TABS: ORAL_TABLET | ORAL | 0 refills | Status: DC

## 2017-06-16 ENCOUNTER — Telehealth: Payer: Self-pay | Admitting: Internal Medicine

## 2017-06-16 ENCOUNTER — Other Ambulatory Visit: Payer: Self-pay

## 2017-06-16 ENCOUNTER — Other Ambulatory Visit: Payer: Self-pay | Admitting: Internal Medicine

## 2017-06-16 MED ORDER — METFORMIN HCL 500 MG PO TABS: ORAL_TABLET | ORAL | 0 refills | Status: DC

## 2017-06-16 NOTE — Telephone Encounter (Signed)
Copied from [place] [id number]. Topic: Quick Communication - See Telephone Encounter >> Jun 16, 2017  3:00 PM Synthia Innocent wrote: CRM for notification. See Telephone encounter for: 06/16/17. Home delivery has not delivered his metFORMIN (GLUCOPHAGE) 500 MG tablet, he is now out. Any samples available?

## 2017-06-16 NOTE — Telephone Encounter (Signed)
Patient came in office today to see if we had samples. Dr. Lars Mage nurse is assisting patient with this matter.

## 2017-06-25 ENCOUNTER — Other Ambulatory Visit: Payer: Medicare Other

## 2017-06-27 ENCOUNTER — Ambulatory Visit (INDEPENDENT_AMBULATORY_CARE_PROVIDER_SITE_OTHER): Payer: Medicare Other

## 2017-06-27 ENCOUNTER — Encounter
Admission: RE | Admit: 2017-06-27 | Discharge: 2017-06-27 | Disposition: A | Discharge status: Home / Self Care | Payer: Medicare Other | Source: Ambulatory Visit | Attending: Unknown Physician Specialty | Admitting: Unknown Physician Specialty

## 2017-06-27 ENCOUNTER — Ambulatory Visit (INDEPENDENT_AMBULATORY_CARE_PROVIDER_SITE_OTHER): Payer: Medicare Other | Admitting: Internal Medicine

## 2017-06-27 ENCOUNTER — Other Ambulatory Visit: Payer: Self-pay

## 2017-06-27 ENCOUNTER — Encounter: Payer: Self-pay | Admitting: Internal Medicine

## 2017-06-27 VITALS — BP 116/72 | HR 71 | Temp 98.5°F | Resp 16 | Ht 69.0 in | Wt 194.3 lb

## 2017-06-27 DIAGNOSIS — D649 Anemia, unspecified: Secondary | ICD-10-CM

## 2017-06-27 DIAGNOSIS — R7989 Other specified abnormal findings of blood chemistry: Secondary | ICD-10-CM

## 2017-06-27 DIAGNOSIS — Z01810 Encounter for preprocedural cardiovascular examination: Secondary | ICD-10-CM | POA: Insufficient documentation

## 2017-06-27 DIAGNOSIS — N183 Chronic kidney disease, stage 3 (moderate): Secondary | ICD-10-CM

## 2017-06-27 DIAGNOSIS — R945 Abnormal results of liver function studies: Secondary | ICD-10-CM | POA: Diagnosis not present

## 2017-06-27 DIAGNOSIS — R200 Anesthesia of skin: Secondary | ICD-10-CM

## 2017-06-27 DIAGNOSIS — E119 Type 2 diabetes mellitus without complications: Secondary | ICD-10-CM | POA: Diagnosis not present

## 2017-06-27 DIAGNOSIS — Z01812 Encounter for preprocedural laboratory examination: Secondary | ICD-10-CM | POA: Diagnosis not present

## 2017-06-27 DIAGNOSIS — L4050 Arthropathic psoriasis, unspecified: Secondary | ICD-10-CM | POA: Diagnosis not present

## 2017-06-27 DIAGNOSIS — I10 Essential (primary) hypertension: Secondary | ICD-10-CM

## 2017-06-27 DIAGNOSIS — J9811 Atelectasis: Secondary | ICD-10-CM | POA: Diagnosis not present

## 2017-06-27 DIAGNOSIS — K219 Gastro-esophageal reflux disease without esophagitis: Secondary | ICD-10-CM

## 2017-06-27 DIAGNOSIS — E785 Hyperlipidemia, unspecified: Secondary | ICD-10-CM

## 2017-06-27 DIAGNOSIS — Z01818 Encounter for other preprocedural examination: Secondary | ICD-10-CM

## 2017-06-27 DIAGNOSIS — I2510 Atherosclerotic heart disease of native coronary artery without angina pectoris: Secondary | ICD-10-CM

## 2017-06-27 DIAGNOSIS — E0822 Diabetes mellitus due to underlying condition with diabetic chronic kidney disease: Secondary | ICD-10-CM | POA: Diagnosis not present

## 2017-06-27 HISTORY — DX: Candidal esophagitis: B37.81

## 2017-06-27 LAB — CBC WITH DIFFERENTIAL/PLATELET
BASOS ABS: 0 10*3/uL (ref 0.0–0.1)
Basophils Relative: 0.6 % (ref 0.0–3.0)
Eosinophils Absolute: 0.4 10*3/uL (ref 0.0–0.7)
Eosinophils Relative: 5.9 % — ABNORMAL HIGH (ref 0.0–5.0)
HCT: 36.4 % — ABNORMAL LOW (ref 39.0–52.0)
Hemoglobin: 12.2 g/dL — ABNORMAL LOW (ref 13.0–17.0)
LYMPHS ABS: 1.6 10*3/uL (ref 0.7–4.0)
Lymphocytes Relative: 25 % (ref 12.0–46.0)
MCHC: 33.6 g/dL (ref 30.0–36.0)
MCV: 83.8 fl (ref 78.0–100.0)
Monocytes Absolute: 0.7 10*3/uL (ref 0.1–1.0)
Monocytes Relative: 10.8 % (ref 3.0–12.0)
NEUTROS ABS: 3.8 10*3/uL (ref 1.4–7.7)
NEUTROS PCT: 57.7 % (ref 43.0–77.0)
PLATELETS: 205 10*3/uL (ref 150.0–400.0)
RBC: 4.35 Mil/uL (ref 4.22–5.81)
RDW: 16.1 % — ABNORMAL HIGH (ref 11.5–15.5)
WBC: 6.6 10*3/uL (ref 4.0–10.5)

## 2017-06-27 LAB — BASIC METABOLIC PANEL
ANION GAP: 10 (ref 5–15)
BUN: 13 mg/dL (ref 6–20)
CALCIUM: 9.4 mg/dL (ref 8.9–10.3)
CO2: 26 mmol/L (ref 22–32)
CREATININE: 0.77 mg/dL (ref 0.61–1.24)
Chloride: 100 mmol/L — ABNORMAL LOW (ref 101–111)
GFR calc Af Amer: >60 mL/min (ref >60)
GLUCOSE: 172 mg/dL — AB (ref 65–99)
Potassium: 3.6 mmol/L (ref 3.5–5.1)
Sodium: 136 mmol/L (ref 135–145)

## 2017-06-27 LAB — HEPATIC FUNCTION PANEL
ALT: 18 U/L (ref 0–53)
AST: 15 U/L (ref 0–37)
Albumin: 4.4 g/dL (ref 3.5–5.2)
Alkaline Phosphatase: 38 U/L — ABNORMAL LOW (ref 39–117)
BILIRUBIN DIRECT: 0.1 mg/dL (ref 0.0–0.3)
BILIRUBIN TOTAL: 0.4 mg/dL (ref 0.2–1.2)
TOTAL PROTEIN: 7.6 g/dL (ref 6.0–8.3)

## 2017-06-27 LAB — VITAMIN D 25 HYDROXY (VIT D DEFICIENCY, FRACTURES): VITD: 29.52 ng/mL — AB (ref 30.00–100.00)

## 2017-06-27 LAB — FERRITIN: Ferritin: 6.6 ng/mL — ABNORMAL LOW (ref 22.0–322.0)

## 2017-06-27 LAB — HEMOGLOBIN A1C: Hgb A1c MFr Bld: 6.9 % — ABNORMAL HIGH (ref 4.6–6.5)

## 2017-06-27 NOTE — Progress Notes (Signed)
Patient ID: Shane Mcknight, male   DOB: [birth year], 67 y.o.   MRN: [id number]   Subjective:    Patient ID: Shane Mcknight, male    DOB: Jan 09, 1951, 67 y.o.   MRN: [id number]  HPI  Patient here for a scheduled follow up and for pre op evaluation.  He reports he is doing well except for the pain and numbness in his hands.  Planning for carpal tunnel surgery.  Scheduled for next week.  He went for pre op at the hospital earlier today.  Had EKG.  Reports no chest pain.  No sob.  No acid reflux.  No abdominal pain.  Bowels moving.  Some occasional allergy issues, but denies any significant sinus pressure or congestion.  Discussed with him the need to talk with Dr Fletcher Anon regarding cardiac clearance and he plans to discuss recommendations for stopping the blood thinner with Dr Fletcher Anon.      Past Medical History:  Diagnosis Date  . Abnormal liver function tests   . Allergic state   . Arthritis    spine, hands, shoulder with previous cuff tear  . CAD (coronary artery disease)    a.  11/2015 CT Chest/Abd: Coronray Ca2+ noted;  b. MV: EF 45-54%, med defect of mod severity in basal infsept, basal inf, mid infsept, and mid inf region-> infarct and peri-infarct ischemia-->intermediate risk; c. 05/2016 Cath/PCI: LM 40ost, LAD 30p, 9m D1 60ost, LCX 60ost, OM1 99 (small->Med Rx), RCA 60p, 95d (2.5x15 Resolute Onyx DES), RPDA/RPLB min irregs, D2 60, EF 55-65%/  . Candidiasis of esophagus ([place]   . Colon polyp   . DDD (degenerative disc disease), lumbosacral   . Dysphagia   . Elevated transaminase level   . Esophageal stricture    a. s/p multiple dilations - last 03/2016.  . Ganglion cyst of finger of right hand   . GERD (gastroesophageal reflux disease)   . Guaiac positive stools   . Hiatal hernia   . HNP (herniated nucleus pulposus), lumbar   . Hypercholesterolemia   . Hypertension   . Lumbar radiculitis   . Mallory-Weiss tear    a. 11/2015.  . Migraine aura without headache    migraine, visual  .  Psoriatic arthritis ([place]   . Sleep apnea    does not uses CPAP  . Type II diabetes mellitus ([place]    Past Surgical History:  Procedure Laterality Date  . ARTHROPLASTY     right thumb; left thumb  . BALLOON DILATION N/A 05/29/2015   Procedure: BALLOON DILATION;  Surgeon: MLollie Sails MD;  [facility];  Service: Endoscopy;  Laterality: N/A;  . BALLOON DILATION N/A 02/14/2017   Procedure: BALLOON DILATION;  Surgeon: SLollie Sails MD;  [facility];  Service: Endoscopy;  Laterality: N/A;  . BLEPHAROPLASTY    . CARDIAC CATHETERIZATION     1 stent   . CARPAL TUNNEL RELEASE     right  . CHOLECYSTECTOMY    . COLONOSCOPY    . CORONARY STENT INTERVENTION N/A 06/14/2016   Procedure: Coronary Stent Intervention;  Surgeon: AWellington Hampshire MD;  [facility];  Service: Cardiovascular;  Laterality: N/A;  . CYST EXCISION  04/14/2015   tendon sheath cyst excision; right ring finger cyst removed and trigger finger realease  . ESOPHAGOGASTRODUODENOSCOPY    . ESOPHAGOGASTRODUODENOSCOPY (EGD) WITH PROPOFOL N/A 05/29/2015   Procedure: ESOPHAGOGASTRODUODENOSCOPY (EGD) WITH PROPOFOL;  Surgeon: MLollie Sails MD;  [facility];  Service: Endoscopy;  Laterality:  N/A;  . ESOPHAGOGASTRODUODENOSCOPY (EGD) WITH PROPOFOL N/A 07/04/2015   Procedure: ESOPHAGOGASTRODUODENOSCOPY (EGD) WITH PROPOFOL;  Surgeon: Lollie Sails, MD;  Location[facility] ENDOSCOPY;  Service: Endoscopy;  Laterality: N/A;  . ESOPHAGOGASTRODUODENOSCOPY (EGD) WITH PROPOFOL N/A 01/04/2016   Procedure: ESOPHAGOGASTRODUODENOSCOPY (EGD) WITH PROPOFOL;  Surgeon: Lollie Sails, MD;  Location: [facility] ENDOSCOPY;  Service: Endoscopy;  Laterality: N/A;  . ESOPHAGOGASTRODUODENOSCOPY (EGD) WITH PROPOFOL N/A 03/22/2016   Procedure: ESOPHAGOGASTRODUODENOSCOPY (EGD) WITH PROPOFOL;  Surgeon: Lollie Sails, MD;  Location: [facility] ENDOSCOPY;  Service: Endoscopy;  Laterality: N/A;  .  ESOPHAGOGASTRODUODENOSCOPY (EGD) WITH PROPOFOL N/A 02/14/2017   Procedure: ESOPHAGOGASTRODUODENOSCOPY (EGD) WITH PROPOFOL;  Surgeon: Lollie Sails, MD;  Location: [facility] ENDOSCOPY;  Service: Endoscopy;  Laterality: N/A;  . ESOPHAGOGASTRODUODENOSCOPY (EGD) WITH PROPOFOL N/A 04/11/2017   Procedure: ESOPHAGOGASTRODUODENOSCOPY (EGD) WITH PROPOFOL;  Surgeon: Lollie Sails, MD;  Location: [facility] ENDOSCOPY;  Service: Endoscopy;  Laterality: N/A;  . EYE SURGERY    . FOREIGN BODY REMOVAL N/A 12/03/2015   Procedure: FOREIGN BODY REMOVAL;  Surgeon: Wilford Corner, MD;  Location: [facility] ENDOSCOPY;  Service: Endoscopy;  Laterality: N/A;  . HERNIA REPAIR    . LEFT HEART CATH Left 06/14/2016   Procedure: Left Heart Cath;  Surgeon: Wellington Hampshire, MD;  Location: [place] CV LAB;  Service: Cardiovascular;  Laterality: Left;  . LUMBAR DISC SURGERY     injections; herniated disc; percutaneous discectomy  . NASAL SINUS SURGERY  2008  . percutaneous lumbar discectomy    . ROTATOR CUFF REPAIR Right   . TONSILLECTOMY     Family History  Problem Relation Age of Onset  . Lung cancer Father   . Cancer Father   . Hypertension Mother   . Aneurysm Son    Social History   Socioeconomic History  . Marital status: Married    Spouse name: Not on file  . Number of children: Not on file  . Years of education: Not on file  . Highest education level: Not on file  Occupational History  . Not on file  Social Needs  . Financial resource strain: Not on file  . Food insecurity:    Worry: Not on file    Inability: Not on file  . Transportation needs:    Medical: Not on file    Non-medical: Not on file  Tobacco Use  . Smoking status: Former Smoker    Types: Cigarettes    Last attempt to quit: 07/06/1982    Years since quitting: 35.0  . Smokeless tobacco: Never Used  Substance and Sexual Activity  . Alcohol use: Yes    Alcohol/week: 0.0 oz    Comment: rarely  . Drug use: No  . Sexual activity: Not on  file  Lifestyle  . Physical activity:    Days per week: Not on file    Minutes per session: Not on file  . Stress: Not on file  Relationships  . Social connections:    Talks on phone: Not on file    Gets together: Not on file    Attends religious service: Not on file    Active member of club or organization: Not on file    Attends meetings of clubs or organizations: Not on file    Relationship status: Not on file  Other Topics Concern  . Not on file  Social History Narrative   Lives locally with wife.  Active but does not routinely exercise.    Outpatient Encounter Medications as of 06/27/2017  Medication Sig  .  aspirin EC 81 MG tablet Take 81 mg by mouth daily.  Marland Kitchen atenolol (TENORMIN) 50 MG tablet TAKE 1 TABLET(50 MG) BY MOUTH TWICE DAILY  . COSENTYX SENSOREADY PEN 150 MG/ML SOAJ Inject 300 mg into the skin every 30 (thirty) days. Due 12/16/2015  . FOLIC ACID PO Take 10 mg by mouth daily.   Marland Kitchen gabapentin (NEURONTIN) 300 MG capsule TAKE 1 CAPSULE(300 MG) BY MOUTH TWICE DAILY (Patient taking differently: TAKE 1 CAPSULE(300 MG) BY MOUTH AT BEDTIME AS NEEDED FOR PAIN)  . hydrochlorothiazide (HYDRODIURIL) 25 MG tablet TAKE 1 TABLET BY MOUTH EVERY DAY  . losartan (COZAAR) 50 MG tablet TAKE 1 TABLET(50 MG) BY MOUTH DAILY. FOLLOW UP APPOINTMENT.THANKS  . metFORMIN (GLUCOPHAGE) 500 MG tablet TAKE 4 TABLETS BY MOUTH EVERY EVENING. (Patient taking differently: Take 1,000 mg by mouth 2 (two) times daily with a meal. )  . Multiple Vitamin (MULTIVITAMIN) tablet Take 1 tablet by mouth daily.  . nitroGLYCERIN (NITROSTAT) 0.4 MG SL tablet Place 1 tablet (0.4 mg total) under the tongue every 5 (five) minutes as needed.  . pantoprazole (PROTONIX) 40 MG tablet Take 1 tablet (40 mg total) by mouth 2 (two) times daily.  . prasugrel (EFFIENT) 10 MG TABS tablet TAKE 1 TABLET(10 MG) BY MOUTH DAILY  . rosuvastatin (CRESTOR) 20 MG tablet TAKE 1 TABLET BY MOUTH EVERY DAY AT 6: 00 PM  . vitamin E 100 UNIT  capsule Take 400 Units by mouth daily.   No facility-administered encounter medications on file as of 06/27/2017.     Review of Systems  Constitutional: Negative for appetite change and unexpected weight change.  HENT: Negative for congestion and sinus pressure.   Respiratory: Negative for cough, chest tightness and shortness of breath.   Cardiovascular: Negative for chest pain, palpitations and leg swelling.  Gastrointestinal: Negative for abdominal pain, diarrhea, nausea and vomiting.  Genitourinary: Negative for difficulty urinating and dysuria.  Musculoskeletal: Negative for myalgias.       Increased discomfort in his hands from CTS.    Skin: Negative for color change and rash.  Neurological: Negative for dizziness, light-headedness and headaches.  Psychiatric/Behavioral: Negative for agitation and dysphoric mood.       Objective:     Blood pressure rechecked by me:  128/68  Physical Exam  Constitutional: He appears well-developed and well-nourished. No distress.  HENT:  Nose: Nose normal.  Mouth/Throat: Oropharynx is clear and moist.  Neck: Neck supple.  Cardiovascular: Normal rate and regular rhythm.  Pulmonary/Chest: Effort normal and breath sounds normal. No respiratory distress.  Abdominal: Soft. Bowel sounds are normal. There is no tenderness.  Musculoskeletal: He exhibits no edema or tenderness.  Lymphadenopathy:    He has no cervical adenopathy.  Skin: No rash noted. No erythema.  Psychiatric: He has a normal mood and affect. His behavior is normal.    BP 116/72 (BP Location: Left Arm, Patient Position: Sitting, Cuff Size: Normal)   Pulse 71   Temp 98.5 F (36.9 C) (Oral)   Resp 16   Ht '5\' 9"'  (1.753 m)   Wt 194 lb 4.8 oz (88.1 kg)   SpO2 98%   BMI 28.69 kg/m  Wt Readings from Last 3 Encounters:  06/27/17 194 lb 4.8 oz (88.1 kg)  06/27/17 194 lb 4.8 oz (88.1 kg)  04/11/17 187 lb (84.8 kg)     Lab Results  Component Value Date   WBC 6.6 06/27/2017     HGB 12.2 (L) 06/27/2017   HCT 36.4 (L) 06/27/2017  PLT 205.0 06/27/2017   GLUCOSE 172 (H) 06/27/2017   CHOL 107 03/21/2017   TRIG 130.0 03/21/2017   HDL 37.00 (L) 03/21/2017   LDLCALC 44 03/21/2017   ALT 18 06/27/2017   AST 15 06/27/2017   NA 136 06/27/2017   K 3.6 06/27/2017   CL 100 (L) 06/27/2017   CREATININE 0.77 06/27/2017   BUN 13 06/27/2017   CO2 26 06/27/2017   TSH 1.53 11/29/2016   PSA 2.35 11/29/2016   INR 1.1 06/07/2016   HGBA1C 6.9 (H) 06/27/2017   MICROALBUR 4.4 (H) 11/29/2016       Assessment & Plan:   Problem List Items Addressed This Visit    Abnormal liver function tests - Primary    Follow liver panel.        Relevant Orders   Hepatic function panel (Completed)   Anemia    Has seen GI.  Recheck cbc.        Relevant Orders   CBC with Differential/Platelet (Completed)   Ferritin (Completed)   Bilateral hand numbness    With CTS.  Planning for surgery.  Due to see ortho today.        CAD (coronary artery disease)    S/p stent RCA.  On effient.  Followed by cardiology.  Discussed the need for cardiac clearance and he plans to discuss with Dr Fletcher Anon regarding recommendations for holding the effient.        Diabetes mellitus ([place])    Low carb diet and exercise.  Follow met b and a1c.        Relevant Orders   Hemoglobin A1c (Completed)   GERD (gastroesophageal reflux disease)    Controlled on current regimen.        Hyperlipidemia    On crestor.  Has eaten today.  Unable to check cholesterol.  Low cholesterol diet and exercise.  Follow lipid panel and liver function tests.        Hypertension    Blood pressure under good control.  Continue same medication regimen.  Follow pressures.  Follow metabolic panel.        Pre-op evaluation    Pt went for pre op today at hospital.  EKG obtained.  Sees cardiology.  Discussed the need for cardiac clearance and recommendation for stopping blood thinner - through Dr Fletcher Anon.  States he plans to  discuss with Dr Fletcher Anon.  Currently asymptomatic.  No chest pain.  No sob.  No increased congestion.  Blood pressure on my check today ok.  Will need close intra op and post op monitoring of heart rate and blood pressure to avoid extremes.  Further cardiac recs pending cardiology assessment.        Relevant Orders   VITAMIN D 25 Hydroxy (Vit-D Deficiency, Fractures) (Completed)   DG Chest 2 View (Completed)   Psoriatic arthritis ([place])    Followed by rheumatology.            Einar Pheasant, MD

## 2017-06-27 NOTE — Patient Instructions (Signed)
Your procedure is scheduled on: Friday, July 04, 2017 Report to Day Surgery on the 2nd floor of the [facility]. To find out your arrival time, please call [phone] between 1PM - 3PM on: Thursday, July 03, 2017  REMEMBER: Instructions that are not followed completely may result in serious medical risk, up to and including death; or upon the discretion of your surgeon and anesthesiologist your surgery may need to be rescheduled.  Do not eat food after midnight the night before your procedure.  No gum chewing, lozengers or hard candies.  You may however, drink water up to 2 hours before you are scheduled to arrive for your surgery. Do not drink anything within 2 hours of the start of your surgery.  No Alcohol for 24 hours before or after surgery.  No Smoking including e-cigarettes for 24 hours prior to surgery.  No chewable tobacco products for at least 6 hours prior to surgery.  No nicotine patches on the day of surgery.  On the morning of surgery brush your teeth with toothpaste and water, you may rinse your mouth with mouthwash if you wish. Do not swallow any toothpaste or mouthwash.  Notify your doctor if there is any change in your medical condition (cold, fever, infection).  Do not wear jewelry, make-up, hairpins, clips or nail polish.  Do not wear lotions, powders, or perfumes. You may wear deodorant.  Do not shave 48 hours prior to surgery. Men may shave face and neck.  Contacts and dentures may not be worn into surgery.  Do not bring valuables to the hospital, including drivers license, insurance or credit cards.  [facility] is not responsible for any belongings or valuables.   TAKE THESE MEDICATIONS THE MORNING OF SURGERY:  1.  Atenolol 2.  Pantoprazole  Use CHG Soap or wipes as directed on instruction sheet.  Stop Metformin 2 days prior to surgery. Last day to take is Tuesday, June 18. Resume after surgery.  Follow recommendations from Cardiologist,  Pulmonologist or PCP regarding stopping Aspirin and PRASUGREL.  NOW!  Stop Anti-inflammatories (NSAIDS) such as Advil, Aleve, Ibuprofen, Motrin, Naproxen, Naprosyn and Aspirin based products such as Excedrin, Goodys Powder, BC Powder. (May take Tylenol or Acetaminophen if needed.)  NOW!  Stop ANY OVER THE COUNTER supplements until after surgery. (FOLIC ACID AND VITAMIN E) (May continue multivitamin.)  Wear comfortable clothing (specific to your surgery type) to the hospital.  Plan for stool softeners for home use.  If you are being discharged the day of surgery, you will not be allowed to drive home. You will need a responsible adult to drive you home and stay with you that night.   If you are taking public transportation, you will need to have a responsible adult with you. Please confirm with your physician that it is acceptable to use public transportation.   Please call [phone] if you have any questions about these instructions.

## 2017-06-30 ENCOUNTER — Encounter: Payer: Self-pay | Admitting: Internal Medicine

## 2017-06-30 DIAGNOSIS — Z01818 Encounter for other preprocedural examination: Secondary | ICD-10-CM | POA: Insufficient documentation

## 2017-06-30 NOTE — Assessment & Plan Note (Signed)
Follow liver panel.  

## 2017-06-30 NOTE — Assessment & Plan Note (Signed)
Blood pressure under good control.  Continue same medication regimen.  Follow pressures.  Follow metabolic panel.   

## 2017-06-30 NOTE — Assessment & Plan Note (Signed)
With CTS.  Planning for surgery.  Due to see ortho today.

## 2017-06-30 NOTE — Assessment & Plan Note (Signed)
Pt went for pre op today at hospital.  EKG obtained.  Sees cardiology.  Discussed the need for cardiac clearance and recommendation for stopping blood thinner - through Dr Fletcher Anon.  States he plans to discuss with Dr Fletcher Anon.  Currently asymptomatic.  No chest pain.  No sob.  No increased congestion.  Blood pressure on my check today ok.  Will need close intra op and post op monitoring of heart rate and blood pressure to avoid extremes.  Further cardiac recs pending cardiology assessment.

## 2017-06-30 NOTE — Assessment & Plan Note (Signed)
On crestor.  Has eaten today.  Unable to check cholesterol.  Low cholesterol diet and exercise.  Follow lipid panel and liver function tests.

## 2017-06-30 NOTE — Assessment & Plan Note (Signed)
Controlled on current regimen.   

## 2017-06-30 NOTE — Assessment & Plan Note (Signed)
Followed by rheumatology. 

## 2017-06-30 NOTE — Assessment & Plan Note (Signed)
S/p stent RCA.  On effient.  Followed by cardiology.  Discussed the need for cardiac clearance and he plans to discuss with Dr Fletcher Anon regarding recommendations for holding the effient.

## 2017-06-30 NOTE — Assessment & Plan Note (Signed)
Has seen GI.  Recheck cbc.   

## 2017-06-30 NOTE — Assessment & Plan Note (Signed)
Low carb diet and exercise.  Follow met b and a1c.   

## 2017-07-01 ENCOUNTER — Telehealth: Payer: Self-pay | Admitting: Cardiovascular Disease

## 2017-07-01 NOTE — Telephone Encounter (Signed)
I spoke with the patient.  He is aware he will need to come into the office to be seen for clearance. He is agreeable to being seen by Ignacia Bayley, NP on 07/02/17 at 2:30 pm.

## 2017-07-01 NOTE — Telephone Encounter (Signed)
I called and spoke with Cheila at [facility] (Orthopedics) to find out what type of anesthesia the patient will require for his surgery. Per Cheila the patient will require general anesthesia.  She states the patient stopped his "blood thinner" on Friday because he hadn't heard from anyone. I advised Cheila that our office was just notified of the patient's surgery today.  She is aware that we will need to contact the patient to come in for cardiac clearance evaluation and that I will be in touch with him.   I left a message for the patient to call back. Reviewed with Ignacia Bayley, NP and he has agreed to see the patient tomorrow at 2:30 pm.

## 2017-07-01 NOTE — Telephone Encounter (Signed)
° °   [facility] Pre-operative Risk Assessment    Request for surgical clearance:  1. What type of surgery is being performed? Bilateral Open Carpal Tunnel Release   2. When is this surgery scheduled? 07/04/17   3. What type of clearance is required (medical clearance vs. Pharmacy clearance to hold med vs. Both)? Medical   4. Are there any medications that need to be held prior to surgery and how long? Not listed  5. Practice name and name of physician performing surgery? [facility] Dr Leanor Kail   6. What is your office phone number [phone]   7.   What is your office fax number [phone]  8.   Anesthesia type (None, local, MAC, general) ? Not listed

## 2017-07-01 NOTE — Telephone Encounter (Signed)
Spoke with patient earlier. See result note

## 2017-07-02 ENCOUNTER — Encounter: Payer: Self-pay | Admitting: Nurse Practitioner

## 2017-07-02 ENCOUNTER — Ambulatory Visit: Payer: Medicare Other | Admitting: Nurse Practitioner

## 2017-07-02 VITALS — BP 109/69 | HR 71 | Ht 68.5 in | Wt 195.2 lb

## 2017-07-02 DIAGNOSIS — E119 Type 2 diabetes mellitus without complications: Secondary | ICD-10-CM

## 2017-07-02 DIAGNOSIS — E785 Hyperlipidemia, unspecified: Secondary | ICD-10-CM

## 2017-07-02 DIAGNOSIS — I10 Essential (primary) hypertension: Secondary | ICD-10-CM

## 2017-07-02 DIAGNOSIS — Z01810 Encounter for preprocedural cardiovascular examination: Secondary | ICD-10-CM

## 2017-07-02 DIAGNOSIS — I2510 Atherosclerotic heart disease of native coronary artery without angina pectoris: Secondary | ICD-10-CM

## 2017-07-02 MED ORDER — NITROGLYCERIN 0.4 MG SL SUBL: 0.4000 mg | SUBLINGUAL_TABLET | SUBLINGUAL | 6 refills | Status: AC | PRN

## 2017-07-02 NOTE — Progress Notes (Signed)
Office Visit    Patient Name: Shane Mcknight Date of Encounter: 07/02/2017  Primary Care Provider:  Einar Pheasant, MD Primary Cardiologist:  Kathlyn Sacramento, MD  Chief Complaint     67 year old male with a history of CAD status post RCA stenting in June 2018, hypertension, hyperlipidemia, diabetes, sleep apnea, GERD, hiatal hernia, and esophageal stricture who presents for follow-up and preoperative cardiovascular examination.  Past Medical History    Past Medical History:  Diagnosis Date  . Abnormal liver function tests   . Allergic state   . Arthritis    spine, hands, shoulder with previous cuff tear  . CAD (coronary artery disease)    a.  11/2015 CT Chest/Abd: Coronray Ca2+ noted;  b. MV: EF 45-54%, med defect of mod severity in basal infsept, basal inf, mid infsept, and mid inf region-> infarct and peri-infarct ischemia-->intermediate risk; c. 05/2016 Cath/PCI: LM 40ost, LAD 30p, 31m, D1 60ost, LCX 60ost, OM1 99 (small->Med Rx), RCA 60p, 95d (2.5x15 Resolute Onyx DES), RPDA/RPLB min irregs, D2 60, EF 55-65%/  . Candidiasis of esophagus ([place])   . Colon polyp   . DDD (degenerative disc disease), lumbosacral   . Dysphagia   . Elevated transaminase level   . Esophageal stricture    a. s/p multiple dilations - last 03/2016.  . Ganglion cyst of finger of right hand   . GERD (gastroesophageal reflux disease)   . Guaiac positive stools   . Hiatal hernia   . HNP (herniated nucleus pulposus), lumbar   . Hypercholesterolemia   . Hypertension   . Lumbar radiculitis   . Mallory-Weiss tear    a. 11/2015.  . Migraine aura without headache    migraine, visual  . Psoriatic arthritis ([place])   . Sleep apnea    does not uses CPAP  . Type II diabetes mellitus ([place])    Past Surgical History:  Procedure Laterality Date  . ARTHROPLASTY     right thumb; left thumb  . BALLOON DILATION N/A 05/29/2015   Procedure: BALLOON DILATION;  Surgeon: Lollie Sails, MD;  [facility]  ENDOSCOPY;  Service: Endoscopy;  Laterality: N/A;  . BALLOON DILATION N/A 02/14/2017   Procedure: BALLOON DILATION;  Surgeon: Lollie Sails, MD;  Location: [facility] ENDOSCOPY;  Service: Endoscopy;  Laterality: N/A;  . BLEPHAROPLASTY    . CARDIAC CATHETERIZATION     1 stent   . CARPAL TUNNEL RELEASE     right  . CHOLECYSTECTOMY    . COLONOSCOPY    . CORONARY STENT INTERVENTION N/A 06/14/2016   Procedure: Coronary Stent Intervention;  Surgeon: Wellington Hampshire, MD;  Location: [place] [facility] LAB;  Service: Cardiovascular;  Laterality: N/A;  . CYST EXCISION  04/14/2015   tendon sheath cyst excision; right ring finger cyst removed and trigger finger realease  . ESOPHAGOGASTRODUODENOSCOPY    . ESOPHAGOGASTRODUODENOSCOPY (EGD) WITH PROPOFOL N/A 05/29/2015   Procedure: ESOPHAGOGASTRODUODENOSCOPY (EGD) WITH PROPOFOL;  Surgeon: Lollie Sails, MD;  Location: [facility] ENDOSCOPY;  Service: Endoscopy;  Laterality: N/A;  . ESOPHAGOGASTRODUODENOSCOPY (EGD) WITH PROPOFOL N/A 07/04/2015   Procedure: ESOPHAGOGASTRODUODENOSCOPY (EGD) WITH PROPOFOL;  Surgeon: Lollie Sails, MD;  Location: [facility] ENDOSCOPY;  Service: Endoscopy;  Laterality: N/A;  . ESOPHAGOGASTRODUODENOSCOPY (EGD) WITH PROPOFOL N/A 01/04/2016   Procedure: ESOPHAGOGASTRODUODENOSCOPY (EGD) WITH PROPOFOL;  Surgeon: Lollie Sails, MD;  Location: [facility] ENDOSCOPY;  Service: Endoscopy;  Laterality: N/A;  . ESOPHAGOGASTRODUODENOSCOPY (EGD) WITH PROPOFOL N/A 03/22/2016   Procedure: ESOPHAGOGASTRODUODENOSCOPY (EGD) WITH PROPOFOL;  Surgeon: Lollie Sails,  MD;  Location: [facility] ENDOSCOPY;  Service: Endoscopy;  Laterality: N/A;  . ESOPHAGOGASTRODUODENOSCOPY (EGD) WITH PROPOFOL N/A 02/14/2017   Procedure: ESOPHAGOGASTRODUODENOSCOPY (EGD) WITH PROPOFOL;  Surgeon: Lollie Sails, MD;  Location: [facility] ENDOSCOPY;  Service: Endoscopy;  Laterality: N/A;  . ESOPHAGOGASTRODUODENOSCOPY (EGD) WITH PROPOFOL N/A 04/11/2017   Procedure: ESOPHAGOGASTRODUODENOSCOPY (EGD)  WITH PROPOFOL;  Surgeon: Lollie Sails, MD;  Location: [facility] ENDOSCOPY;  Service: Endoscopy;  Laterality: N/A;  . EYE SURGERY    . FOREIGN BODY REMOVAL N/A 12/03/2015   Procedure: FOREIGN BODY REMOVAL;  Surgeon: Wilford Corner, MD;  Location: [facility] ENDOSCOPY;  Service: Endoscopy;  Laterality: N/A;  . HERNIA REPAIR    . LEFT HEART CATH Left 06/14/2016   Procedure: Left Heart Cath;  Surgeon: Wellington Hampshire, MD;  Location: [place] [facility] LAB;  Service: Cardiovascular;  Laterality: Left;  . LUMBAR DISC SURGERY     injections; herniated disc; percutaneous discectomy  . NASAL SINUS SURGERY  2008  . percutaneous lumbar discectomy    . ROTATOR CUFF REPAIR Right   . TONSILLECTOMY      Allergies  Allergies  Allergen Reactions  . Ephedrine Other (See Comments)    pain  . Levsin [Hyoscyamine Sulfate] Other (See Comments)    Urinary retention  . Naprosyn [Naproxen] Other (See Comments)    GI upset   . Sudafed [Pseudoephedrine Hcl] Other (See Comments)    Constricts prostate flow  . Vicodin [Hydrocodone-Acetaminophen] Other (See Comments)    Other reaction(s): Other (See Comments) GI Upset  . Amoxicillin Itching and Rash  . Other Rash    Paper tape causes rash  . Penicillins Itching and Rash    Has patient had a PCN reaction causing immediate rash, facial/tongue/throat swelling, SOB or lightheadedness with hypotension: yes Has patient had a PCN reaction causing severe rash involving mucus membranes or skin necrosis: no Has patient had a PCN reaction that required hospitalization: no Has patient had a PCN reaction occurring within the last 10 years: no If all of the above answers are "NO", then may proceed with Cephalosporin use.   . Shellfish Allergy Rash  . Vancomycin Itching and Rash    History of Present Illness    67 year old male with the above complex past medical history including coronary artery disease status post abnormal stress testing in May 2018 followed by  catheterization revealing severe RCA disease.  This was successfully treated with a drug-eluting stent.  He also has severe obtuse marginal disease, however this is a small vessel and is being medically managed.  Other history includes hypertension, hyperlipidemia, diabetes, sleep apnea, GERD, hiatal hernia, esophageal stricture, Mallory-Weiss tear, and Schatzki ring.  He is status post EGD earlier this year.  At the time of EGD, he held his Effient for 5 days preprocedure and resumed post procedure.  He tolerated EGD well.  He has bilateral carpal tunnel syndrome with numbness involving his fingers and thumbs.  Numbness is worse on the left..  He has been evaluated by orthopedics and is scheduled for bilateral open carpal tunnel release on June 21.  In  that setting, he was advised to hold his Effient beginning 5 days beforehand.  He was also scheduled for preoperative clearance.  From a cardiac standpoint he has been doing well without chest pain, dyspnea, palpitations.  He is relatively active though has some hip pain that limits his activity some.  He is able to push mow his yard without difficulty or symptoms.  He denies PND, orthopnea, dizziness,  syncope, edema, or early satiety.  Home Medications    Prior to Admission medications   Medication Sig Start Date End Date Taking? Authorizing Provider  aspirin EC 81 MG tablet Take 81 mg by mouth daily.    [provider]  atenolol (TENORMIN) 50 MG tablet TAKE 1 TABLET(50 MG) BY MOUTH TWICE DAILY 03/05/17   Einar Pheasant, MD  COSENTYX SENSOREADY PEN 150 MG/ML SOAJ Inject 300 mg into the skin every 30 (thirty) days. Due 12/16/2015 04/06/15   [provider]  FOLIC ACID PO Take 10 mg by mouth daily.     [provider]  gabapentin (NEURONTIN) 300 MG capsule TAKE 1 CAPSULE(300 MG) BY MOUTH TWICE DAILY Patient taking differently: TAKE 1 CAPSULE(300 MG) BY MOUTH AT BEDTIME AS NEEDED FOR PAIN 03/05/17   Einar Pheasant, MD    hydrochlorothiazide (HYDRODIURIL) 25 MG tablet TAKE 1 TABLET BY MOUTH EVERY DAY 03/05/17   Einar Pheasant, MD  losartan (COZAAR) 50 MG tablet TAKE 1 TABLET(50 MG) BY MOUTH DAILY. FOLLOW UP APPOINTMENT.THANKS 06/02/17   Einar Pheasant, MD  metFORMIN (GLUCOPHAGE) 500 MG tablet TAKE 4 TABLETS BY MOUTH EVERY EVENING. Patient taking differently: Take 1,000 mg by mouth 2 (two) times daily with a meal.  06/16/17   Einar Pheasant, MD  Multiple Vitamin (MULTIVITAMIN) tablet Take 1 tablet by mouth daily.    [provider]  nitroGLYCERIN (NITROSTAT) 0.4 MG SL tablet Place 1 tablet (0.4 mg total) under the tongue every 5 (five) minutes as needed. 06/07/16   Theora Gianotti, NP  pantoprazole (PROTONIX) 40 MG tablet Take 1 tablet (40 mg total) by mouth 2 (two) times daily. 12/08/15   Dustin Flock, MD  prasugrel (EFFIENT) 10 MG TABS tablet TAKE 1 TABLET(10 MG) BY MOUTH DAILY 04/14/17   Wellington Hampshire, MD  rosuvastatin (CRESTOR) 20 MG tablet TAKE 1 TABLET BY MOUTH EVERY DAY AT 6: 00 PM 05/22/17   Theora Gianotti, NP  vitamin E 100 UNIT capsule Take 400 Units by mouth daily.    [provider]    Review of Systems    He denies chest pain, palpitations, dyspnea, pnd, orthopnea, n, v, dizziness, syncope, edema, weight gain, or early satiety.  Bilat wrist pain/numbness involving fingers/thumbs.  All other systems reviewed and are otherwise negative except as noted above.  Physical Exam    VS:  BP 109/69 (BP Location: Left Arm, Patient Position: Sitting, Cuff Size: Normal)   Pulse 71   Ht 5' 8.5" (1.74 m)   Wt 195 lb 4 oz (88.6 kg)   BMI 29.26 kg/m  , BMI Body mass index is 29.26 kg/m. GEN: Well nourished, well developed, in no acute distress.  HEENT: normal.  Neck: Supple, no JVD, carotid bruits, or masses. Cardiac: RRR, no murmurs, rubs, or gallops. No clubbing, cyanosis, edema.  Radials/DP/PT 2+ and equal bilaterally.  Respiratory:  Respirations regular and  unlabored, clear to auscultation bilaterally. GI: Soft, nontender, nondistended, BS + x 4. MS: no deformity or atrophy. Skin: warm and dry, no rash. Neuro:  Strength and sensation are intact. Psych: Normal affect.  Accessory Clinical Findings    ECG - 6.14.2019: Sinus Brady, 56, LAD, probable LAE  Assessment & Plan    1.  Coronary artery disease/perioperative cardiovascular examination: S/p PCI of the RCA in 06/2016 w/ residual severe small vessel/OM dzs and otw nonobstructive dzs.  He has done well over the past year w/o chest pain, dyspnea, or significant limitations in activities other than those posed  by right hip pain.  He is scheduled for bilat carpal tunnel release on 6/21 and has held his effient since earlier this week.  In light of his good fxnl status, absence of symptoms, and low risk surgery, he is @ acceptable risk to proceed with surgery w/o further ischemic testing.  Risk of cardiac event ~ 0.9%.  He should ideally continue ASA throughout the perioperative period, in addition to  blocker and Statin therapy.  He may resume effient post-op.  As it's been a year since his PCI, I will send a note to Dr. Fletcher Anon re: his opinion as to whether or not effient can be permanently d/c'd.  2.  Carpal Tunnel Syndrome:  See #1.  Surgery per ortho later this week.  3.  Essential HTN:  Stable on  blocker, ARB, and hctz.  4.  HL:  LDL 44 in March. Nl LFT's 06/27/17.  Cont statin therapy.  5.  DM II:  Metformin per pcp. A1c 6.9 last week.  6.  Dispo:  F/u in six months.  Murray Hodgkins, NP 07/02/2017, 2:52 PM

## 2017-07-02 NOTE — Patient Instructions (Signed)
Medication Instructions:  Your physician recommends that you continue on your current medications as directed. Please refer to the Current Medication list given to you today.   Labwork: NONE  Testing/Procedures: NONE  Follow-Up: Your physician wants you to follow-up in: [place]. You will receive a reminder letter in the mail two months in advance. If you don't receive a letter, please call our office to schedule the follow-up appointment.   Any Other Special Instructions Will Be Listed Below (If Applicable).     If you need a refill on your cardiac medications before your next appointment, please call your pharmacy.

## 2017-07-03 ENCOUNTER — Encounter: Payer: Self-pay | Admitting: Anesthesiology

## 2017-07-04 ENCOUNTER — Ambulatory Visit: Payer: Medicare Other | Admitting: Anesthesiology

## 2017-07-04 ENCOUNTER — Ambulatory Visit
Admission: RE | Admit: 2017-07-04 | Discharge: 2017-07-04 | Disposition: A | Discharge status: Home / Self Care | Payer: Medicare Other | Source: Ambulatory Visit | Attending: Unknown Physician Specialty | Admitting: Unknown Physician Specialty

## 2017-07-04 ENCOUNTER — Encounter
Admission: RE | Disposition: A | Discharge status: Home / Self Care | Payer: Self-pay | Source: Ambulatory Visit | Attending: Unknown Physician Specialty

## 2017-07-04 ENCOUNTER — Encounter: Payer: Self-pay | Admitting: Emergency Medicine

## 2017-07-04 ENCOUNTER — Telehealth: Payer: Self-pay | Admitting: *Deleted

## 2017-07-04 DIAGNOSIS — Z8249 Family history of ischemic heart disease and other diseases of the circulatory system: Secondary | ICD-10-CM | POA: Insufficient documentation

## 2017-07-04 DIAGNOSIS — M19041 Primary osteoarthritis, right hand: Secondary | ICD-10-CM | POA: Insufficient documentation

## 2017-07-04 DIAGNOSIS — X58XXXA Exposure to other specified factors, initial encounter: Secondary | ICD-10-CM | POA: Insufficient documentation

## 2017-07-04 DIAGNOSIS — Z955 Presence of coronary angioplasty implant and graft: Secondary | ICD-10-CM | POA: Insufficient documentation

## 2017-07-04 DIAGNOSIS — Z6829 Body mass index (BMI) 29.0-29.9, adult: Secondary | ICD-10-CM | POA: Insufficient documentation

## 2017-07-04 DIAGNOSIS — L4050 Arthropathic psoriasis, unspecified: Secondary | ICD-10-CM | POA: Diagnosis not present

## 2017-07-04 DIAGNOSIS — Z79899 Other long term (current) drug therapy: Secondary | ICD-10-CM | POA: Insufficient documentation

## 2017-07-04 DIAGNOSIS — K449 Diaphragmatic hernia without obstruction or gangrene: Secondary | ICD-10-CM | POA: Diagnosis not present

## 2017-07-04 DIAGNOSIS — E7800 Pure hypercholesterolemia, unspecified: Secondary | ICD-10-CM | POA: Insufficient documentation

## 2017-07-04 DIAGNOSIS — Z91013 Allergy to seafood: Secondary | ICD-10-CM | POA: Diagnosis not present

## 2017-07-04 DIAGNOSIS — G4733 Obstructive sleep apnea (adult) (pediatric): Secondary | ICD-10-CM | POA: Diagnosis not present

## 2017-07-04 DIAGNOSIS — M19042 Primary osteoarthritis, left hand: Secondary | ICD-10-CM | POA: Insufficient documentation

## 2017-07-04 DIAGNOSIS — Z885 Allergy status to narcotic agent status: Secondary | ICD-10-CM | POA: Insufficient documentation

## 2017-07-04 DIAGNOSIS — E119 Type 2 diabetes mellitus without complications: Secondary | ICD-10-CM | POA: Insufficient documentation

## 2017-07-04 DIAGNOSIS — Z9049 Acquired absence of other specified parts of digestive tract: Secondary | ICD-10-CM | POA: Diagnosis not present

## 2017-07-04 DIAGNOSIS — K210 Gastro-esophageal reflux disease with esophagitis: Secondary | ICD-10-CM | POA: Insufficient documentation

## 2017-07-04 DIAGNOSIS — I10 Essential (primary) hypertension: Secondary | ICD-10-CM | POA: Diagnosis not present

## 2017-07-04 DIAGNOSIS — Z86010 Personal history of colonic polyps: Secondary | ICD-10-CM | POA: Diagnosis not present

## 2017-07-04 DIAGNOSIS — M4690 Unspecified inflammatory spondylopathy, site unspecified: Secondary | ICD-10-CM | POA: Diagnosis not present

## 2017-07-04 DIAGNOSIS — Z888 Allergy status to other drugs, medicaments and biological substances status: Secondary | ICD-10-CM | POA: Insufficient documentation

## 2017-07-04 DIAGNOSIS — G5603 Carpal tunnel syndrome, bilateral upper limbs: Secondary | ICD-10-CM | POA: Insufficient documentation

## 2017-07-04 DIAGNOSIS — Z880 Allergy status to penicillin: Secondary | ICD-10-CM | POA: Insufficient documentation

## 2017-07-04 DIAGNOSIS — E669 Obesity, unspecified: Secondary | ICD-10-CM | POA: Insufficient documentation

## 2017-07-04 DIAGNOSIS — D649 Anemia, unspecified: Secondary | ICD-10-CM | POA: Insufficient documentation

## 2017-07-04 DIAGNOSIS — Z801 Family history of malignant neoplasm of trachea, bronchus and lung: Secondary | ICD-10-CM | POA: Insufficient documentation

## 2017-07-04 DIAGNOSIS — Z7982 Long term (current) use of aspirin: Secondary | ICD-10-CM | POA: Insufficient documentation

## 2017-07-04 DIAGNOSIS — Z7984 Long term (current) use of oral hypoglycemic drugs: Secondary | ICD-10-CM | POA: Diagnosis not present

## 2017-07-04 DIAGNOSIS — Z87891 Personal history of nicotine dependence: Secondary | ICD-10-CM | POA: Insufficient documentation

## 2017-07-04 DIAGNOSIS — I2510 Atherosclerotic heart disease of native coronary artery without angina pectoris: Secondary | ICD-10-CM | POA: Diagnosis not present

## 2017-07-04 HISTORY — PX: BILATERAL CARPAL TUNNEL RELEASE: SHX6508

## 2017-07-04 LAB — GLUCOSE, CAPILLARY
GLUCOSE-CAPILLARY: 182 mg/dL — AB (ref 65–99)
GLUCOSE-CAPILLARY: 153 mg/dL — AB (ref 65–99)

## 2017-07-04 SURGERY — BILATERAL CARPAL TUNNEL RELEASE
Anesthesia: General | Site: Hand | Laterality: Bilateral | Wound class: "Clean "

## 2017-07-04 MED ORDER — MIDAZOLAM HCL 2 MG/2ML IJ SOLN
INTRAMUSCULAR | Status: DC | PRN
  Administered 2017-07-04: 2 mg via INTRAVENOUS

## 2017-07-04 MED ORDER — MIDAZOLAM HCL 2 MG/2ML IJ SOLN
INTRAMUSCULAR | Status: AC
  Filled 2017-07-04: qty 2

## 2017-07-04 MED ORDER — PHENYLEPHRINE HCL 10 MG/ML IJ SOLN
INTRAMUSCULAR | Status: DC | PRN
  Administered 2017-07-04 (×5): 100 ug via INTRAVENOUS

## 2017-07-04 MED ORDER — ONDANSETRON HCL 4 MG/2ML IJ SOLN
INTRAMUSCULAR | Status: DC | PRN
  Administered 2017-07-04: 4 mg via INTRAVENOUS

## 2017-07-04 MED ORDER — BUPIVACAINE HCL (PF) 0.5 % IJ SOLN
INTRAMUSCULAR | Status: AC
  Filled 2017-07-04: qty 30

## 2017-07-04 MED ORDER — SODIUM CHLORIDE 0.9 % IV SOLN
INTRAVENOUS | Status: DC
  Administered 2017-07-04 (×2): via INTRAVENOUS

## 2017-07-04 MED ORDER — ROCURONIUM BROMIDE 100 MG/10ML IV SOLN
INTRAVENOUS | Status: DC | PRN
  Administered 2017-07-04: 40 mg via INTRAVENOUS
  Administered 2017-07-04: 10 mg via INTRAVENOUS

## 2017-07-04 MED ORDER — FENTANYL CITRATE (PF) 100 MCG/2ML IJ SOLN
INTRAMUSCULAR | Status: AC
  Filled 2017-07-04: qty 2

## 2017-07-04 MED ORDER — ACETAMINOPHEN 10 MG/ML IV SOLN
INTRAVENOUS | Status: DC | PRN
  Administered 2017-07-04: 1000 mg via INTRAVENOUS

## 2017-07-04 MED ORDER — SUGAMMADEX SODIUM 200 MG/2ML IV SOLN
INTRAVENOUS | Status: DC | PRN
  Administered 2017-07-04: 177.2 mg via INTRAVENOUS

## 2017-07-04 MED ORDER — FENTANYL CITRATE (PF) 100 MCG/2ML IJ SOLN
INTRAMUSCULAR | Status: DC | PRN
  Administered 2017-07-04 (×2): 50 ug via INTRAVENOUS

## 2017-07-04 MED ORDER — EPHEDRINE SULFATE 50 MG/ML IJ SOLN
INTRAMUSCULAR | Status: DC | PRN
  Administered 2017-07-04 (×2): 10 mg via INTRAVENOUS

## 2017-07-04 MED ORDER — NORCO 5-325 MG PO TABS: 1.0000 | ORAL_TABLET | Freq: Four times a day (QID) | ORAL | 0 refills | Status: AC | PRN

## 2017-07-04 MED ORDER — ONDANSETRON HCL 4 MG/2ML IJ SOLN: 4.0000 mg | Freq: Once | INTRAMUSCULAR | Status: DC | PRN

## 2017-07-04 MED ORDER — ACETAMINOPHEN 10 MG/ML IV SOLN
INTRAVENOUS | Status: AC
  Filled 2017-07-04: qty 100

## 2017-07-04 MED ORDER — LIDOCAINE HCL (CARDIAC) PF 100 MG/5ML IV SOSY
PREFILLED_SYRINGE | INTRAVENOUS | Status: DC | PRN
  Administered 2017-07-04: 100 mg via INTRAVENOUS

## 2017-07-04 MED ORDER — PROPOFOL 10 MG/ML IV BOLUS
INTRAVENOUS | Status: AC
  Filled 2017-07-04: qty 20

## 2017-07-04 MED ORDER — FENTANYL CITRATE (PF) 100 MCG/2ML IJ SOLN: 25.0000 ug | INTRAMUSCULAR | Status: DC | PRN

## 2017-07-04 MED ORDER — GLYCOPYRROLATE 0.2 MG/ML IJ SOLN
INTRAMUSCULAR | Status: DC | PRN
  Administered 2017-07-04: 0.2 mg via INTRAVENOUS

## 2017-07-04 MED ORDER — SUCCINYLCHOLINE CHLORIDE 20 MG/ML IJ SOLN
INTRAMUSCULAR | Status: DC | PRN
  Administered 2017-07-04: 100 mg via INTRAVENOUS

## 2017-07-04 MED ORDER — PROPOFOL 10 MG/ML IV BOLUS
INTRAVENOUS | Status: DC | PRN
  Administered 2017-07-04: 150 mg via INTRAVENOUS

## 2017-07-04 MED ORDER — BUPIVACAINE HCL (PF) 0.5 % IJ SOLN
INTRAMUSCULAR | Status: DC | PRN
  Administered 2017-07-04 (×2): 5 mL

## 2017-07-04 MED ORDER — DEXAMETHASONE SODIUM PHOSPHATE 10 MG/ML IJ SOLN
INTRAMUSCULAR | Status: DC | PRN
  Administered 2017-07-04: 10 mg via INTRAVENOUS

## 2017-07-04 SURGICAL SUPPLY — 30 items
BANDAGE ELASTIC 2 LF NS (GAUZE/BANDAGES/DRESSINGS) ×3 IMPLANT
BNDG ESMARK 4X12 TAN STRL LF (GAUZE/BANDAGES/DRESSINGS) ×3 IMPLANT
CHLORAPREP W/TINT 26ML (MISCELLANEOUS) ×5 IMPLANT
CUFF TOURN 18 STER (MISCELLANEOUS) ×3 IMPLANT
CUFF TOURN DUAL PL 12 NO SLV (MISCELLANEOUS) ×2 IMPLANT
ELECT REM PT RETURN 9FT ADLT (ELECTROSURGICAL) ×3
ELECTRODE REM PT RTRN 9FT ADLT (ELECTROSURGICAL) ×1 IMPLANT
GAUZE SPONGE 4X4 12PLY STRL (GAUZE/BANDAGES/DRESSINGS) ×3 IMPLANT
GAUZE STRETCH 2X75IN STRL (MISCELLANEOUS) ×4 IMPLANT
GLOVE BIO SURGEON STRL SZ8 (GLOVE) ×3 IMPLANT
GLOVE BIOGEL M STRL SZ7.5 (GLOVE) ×3 IMPLANT
GLOVE INDICATOR 8.0 STRL GRN (GLOVE) ×3 IMPLANT
GOWN STRL REUS W/ TWL LRG LVL3 (GOWN DISPOSABLE) ×2 IMPLANT
GOWN STRL REUS W/TWL LRG LVL3 (GOWN DISPOSABLE) ×4
GOWN STRL REUS W/TWL LRG LVL4 (GOWN DISPOSABLE) ×3 IMPLANT
KIT TURNOVER KIT A (KITS) ×3 IMPLANT
NS IRRIG 500ML POUR BTL (IV SOLUTION) ×3 IMPLANT
PACK EXTREMITY ARMC (MISCELLANEOUS) ×3 IMPLANT
PADDING CAST 2X4YD ST (MISCELLANEOUS) ×2
PADDING CAST BLEND 2X4 STRL (MISCELLANEOUS) ×1 IMPLANT
SOL PREP PVP 2OZ (MISCELLANEOUS) ×3
SOLUTION PREP PVP 2OZ (MISCELLANEOUS) ×1 IMPLANT
SPLINT CAST 1 STEP 3X12 (MISCELLANEOUS) ×5 IMPLANT
SPLINT WRIST LG LT TX990309 (SOFTGOODS) ×2 IMPLANT
SPLINT WRIST LG RT TX900304 (SOFTGOODS) ×2 IMPLANT
STOCKINETTE 48X4 2 PLY STRL (GAUZE/BANDAGES/DRESSINGS) ×1 IMPLANT
STOCKINETTE STRL 4IN 9604848 (GAUZE/BANDAGES/DRESSINGS) ×3 IMPLANT
SUT ETHILON 4-0 (SUTURE) ×4
SUT ETHILON 4-0 FS2 18XMFL BLK (SUTURE) ×2
SUTURE ETHLN 4-0 FS2 18XMF BLK (SUTURE) ×1 IMPLANT

## 2017-07-04 NOTE — Progress Notes (Signed)
Capillary refill positive to left and right hands   Can wiggle fingers bilaterally   Skin warm and dry

## 2017-07-04 NOTE — Telephone Encounter (Signed)
-----   Message from Theora Gianotti, NP sent at 07/04/2017  8:18 AM EDT ----- Hi,  I mentioned to Mr. Sagar that I would discuss permanent discontinuation of effient with Dr. Fletcher Anon when he returned from vacation.  Could you pls let Mr. Sutphen know that Dr. Fletcher Anon is ok with him permanently d/c'ing effient as it has been over a year since his PCI.  Thanks,  Gerald Stabs ----- Message ----- From: Wellington Hampshire, MD Sent: 07/03/2017   2:52 PM To: Theora Gianotti, NP  Yes , can dc effient.   ----- Message ----- From: Theora Gianotti, NP Sent: 07/02/2017   3:09 PM To: Wellington Hampshire, MD  S/p RCA pci 06/14/2016. Residual small vessel/OM dzs. Still on effient.  Do you wish to d/c?

## 2017-07-04 NOTE — Op Note (Signed)
DATE OF SURGERY:  07/04/2017  PATIENT NAME:  Shane Mcknight   DOB: [birth year]  MRN: [id number]  PRE-OPERATIVE DIAGNOSIS: Left carpal tunnel syndrome  POST-OPERATIVE DIAGNOSIS:  Same  PROCEDURE: Left carpal tunnel release  SURGEON: Dr. Leanor Kail, Brooke Bonito. M.D.  ANESTHESIA: General   INDICATIONS FOR SURGERY: Shane Mcknight is a 67 y.o. year old male with a long history of numbness and paresthesias in the left hand. Nerve conduction studies demonstrated findings consistent with left median nerve compression.The patient had not seen any significant improvement despite conservative nonsurgical intervention. After discussion of the risks and benefits of surgical intervention, the patient expressed understanding of the risks benefits and agreed with plans for carpal tunnel release.   PROCEDURE IN DETAIL: The patient had just had an open right carpal tunnel release.  He was still under general anesthesia.  Tourniquet was placed on the patient's left upper forearm.The left hand and arm were prepped  and draped in the usual sterile fashion. A "time-out" was performed as per usual protocol. The hand and forearm were exsanguinated using an Esmarch and the tourniquet was inflated to 250 mmHg.  An incision was made just ulnar to the thenar palmar crease. Dissection was carried down through the palmar fascia to the transverse carpal ligament. The transverse carpal ligament was sharply incised, taking care to protect the underlying structures within the carpal tunnel. Complete release of the transverse carpal ligament was achieved. There was no evidence of a mass or proliferative synovitis within the carpal tunnel. The median nerve did appear to be slightly flattened. The wound was irrigated with saline. The tourniquet was released at this time. It had been up for about 8 minutes. Bleeding was controlled with digital pressure and coagulation cautery. I did inject the subcutaneous tissue of the wound with about 5 cc of  0.5% Marcaine without epinephrine. The skin was then re-approximated with interrupted sutures of #4-0 nylon. A sterile dressing was applied followed by application of a removable volar splint.  The patient was then awakened and transferred to a stretcher bed.  The patient tolerated the procedures well and was transported to the PACU in stable condition. Blood loss was negligible.  Dr. Mariel Kansky. M.D.

## 2017-07-04 NOTE — Anesthesia Postprocedure Evaluation (Signed)
Anesthesia Post Note  Patient: Shane Mcknight  Procedure(s) Performed: BILATERAL CARPAL TUNNEL RELEASE (Bilateral Hand)  Patient location during evaluation: PACU Anesthesia Type: General Level of consciousness: awake and alert Pain management: pain level controlled Vital Signs Assessment: post-procedure vital signs reviewed and stable Respiratory status: spontaneous breathing, nonlabored ventilation, respiratory function stable and patient connected to nasal cannula oxygen Cardiovascular status: blood pressure returned to baseline and stable Postop Assessment: no apparent nausea or vomiting Anesthetic complications: no     Last Vitals:  Vitals:   07/04/17 1002 07/04/17 1033  BP: (!) 158/77 (!) 156/75  Pulse: (!) 57 68  Resp: 16   Temp: (!) 35.9 C   SpO2: 98% 97%    Last Pain:  Vitals:   07/04/17 1033  TempSrc:   PainSc: 0-No pain                 Jazilyn Siegenthaler S

## 2017-07-04 NOTE — Op Note (Signed)
DATE OF SURGERY:  07/04/2017  PATIENT NAME:  Shane Mcknight   DOB: 1950/02/02  MRN: [id number]  PRE-OPERATIVE DIAGNOSIS: Right carpal tunnel syndrome  POST-OPERATIVE DIAGNOSIS:  Same  PROCEDURE: Right carpal tunnel release  SURGEON: Dr. Leanor Kail, Brooke Bonito. M.D.  ANESTHESIA: General   INDICATIONS FOR SURGERY: [name] is a 67 y.o. year old male with a long history of numbness and paresthesias in the right hand. Nerve conduction studies demonstrated findings consistent with right median nerve compression.The patient had not seen any significant improvement despite conservative nonsurgical intervention. After discussion of the risks and benefits of surgical intervention, the patient expressed understanding of the risks benefits and agreed with plans for carpal tunnel release.   PROCEDURE IN DETAIL: The patient was taken the operating room where satisfactory general anesthesia was achieved. A tourniquet was placed on the patient's right upper arm.The right hand and arm were prepped  and draped in the usual sterile fashion. A "time-out" was performed as per usual protocol. The hand and forearm were exsanguinated using an Esmarch and the tourniquet was inflated to 250 mmHg.  An incision was made just ulnar to the thenar palmar crease. Dissection was carried down through the palmar fascia to the transverse carpal ligament. The transverse carpal ligament was sharply incised, taking care to protect the underlying structures within the carpal tunnel. Complete release of the transverse carpal ligament was achieved. There was no evidence of a mass or proliferative synovitis within the carpal tunnel. The median nerve did appear to be slightly flattened. The wound was irrigated with saline. The tourniquet was released at this time. It had been up for about 9 minutes. Bleeding was controlled with digital pressure and coagulation cautery. I did inject the subcutaneous tissue of the wound with about 5 cc of 0.5%  Marcaine without epinephrine. The skin was then re-approximated with interrupted sutures of #4-0 nylon. A sterile dressing was applied followed by application of a removable volar splint.  Attention was then turned to the left wrist where an open carpal tunnel release was also to be performed.  The patient tolerated the procedure well and was transported to the PACU in stable condition. Blood loss was negligible.  Dr. Mariel Kansky. M.D.

## 2017-07-04 NOTE — Telephone Encounter (Signed)
Spoke with patient and reviewed Shane Mcknight recommendations. He verbalized understanding that he could stop taking the Effient and had no further questions at this time.

## 2017-07-04 NOTE — Discharge Instructions (Signed)
Ice packs  Elevation  RTC in about 2 weeks

## 2017-07-04 NOTE — OR Nursing (Signed)
Patient moving fingers less numb some tingling noted right hand

## 2017-07-04 NOTE — Anesthesia Post-op Follow-up Note (Signed)
Anesthesia QCDR form completed.        

## 2017-07-04 NOTE — H&P (Signed)
  H and P reviewed. No changes. Uploaded at later date. 

## 2017-07-04 NOTE — Anesthesia Procedure Notes (Signed)
Procedure Name: Intubation Date/Time: 07/04/2017 8:00 AM Performed by: Nelda Marseille, CRNA Pre-anesthesia Checklist: Patient identified, Patient being monitored, Timeout performed, Emergency Drugs available and Suction available Patient Re-evaluated:Patient Re-evaluated prior to induction Oxygen Delivery Method: Circle system utilized Preoxygenation: Pre-oxygenation with 100% oxygen Induction Type: IV induction Ventilation: Mask ventilation without difficulty Laryngoscope Size: Mac and 3 Grade View: Grade I Tube type: Oral Tube size: 7.5 mm Number of attempts: 1 Airway Equipment and Method: Stylet Placement Confirmation: ETT inserted through vocal cords under direct vision,  positive ETCO2 and breath sounds checked- equal and bilateral Secured at: 21 cm Tube secured with: Tape Dental Injury: Teeth and Oropharynx as per pre-operative assessment

## 2017-07-04 NOTE — Anesthesia Preprocedure Evaluation (Signed)
Anesthesia Evaluation  Patient identified by MRN, date of birth, ID band Patient awake    Reviewed: Allergy & Precautions, NPO status , Patient's Chart, lab work & pertinent test results, reviewed documented beta blocker date and time   Airway Mallampati: II  TM Distance: >3 FB     Dental  (+) Upper Dentures, Lower Dentures   Pulmonary sleep apnea , former smoker,           Cardiovascular hypertension, Pt. on medications and Pt. on home beta blockers + angina + CAD and + Cardiac Stents       Neuro/Psych  Headaches,  Neuromuscular disease    GI/Hepatic hiatal hernia, GERD  Controlled,  Endo/Other  diabetes, Type 2  Renal/GU      Musculoskeletal  (+) Arthritis ,   Abdominal   Peds  Hematology  (+) anemia ,   Anesthesia Other Findings Does not take NTG.  Reproductive/Obstetrics                             Anesthesia Physical Anesthesia Plan  ASA: III  Anesthesia Plan: General   Post-op Pain Management:    Induction: Intravenous  PONV Risk Score and Plan:   Airway Management Planned: Oral ETT  Additional Equipment:   Intra-op Plan:   Post-operative Plan:   Informed Consent: I have reviewed the patients History and Physical, chart, labs and discussed the procedure including the risks, benefits and alternatives for the proposed anesthesia with the patient or authorized representative who has indicated his/her understanding and acceptance.     Plan Discussed with: CRNA  Anesthesia Plan Comments:         Anesthesia Quick Evaluation

## 2017-07-04 NOTE — Transfer of Care (Signed)
Immediate Anesthesia Transfer of Care Note  Patient: Shane Mcknight  Procedure(s) Performed: BILATERAL CARPAL TUNNEL RELEASE (Bilateral Hand)  Patient Location: PACU  Anesthesia Type:General  Level of Consciousness: awake, alert  and oriented  Airway & Oxygen Therapy: Patient Spontanous Breathing and Patient connected to face mask oxygen  Post-op Assessment: Report given to RN and Post -op Vital signs reviewed and stable  Post vital signs: Reviewed and stable  Last Vitals:  Vitals Value Taken Time  BP    Temp    Pulse 70 07/04/2017  9:04 AM  Resp 23 07/04/2017  9:04 AM  SpO2 97 % 07/04/2017  9:04 AM  Vitals shown include unvalidated device data.  Last Pain:  Vitals:   07/04/17 0639  TempSrc: Tympanic  PainSc: 3          Complications: No apparent anesthesia complications

## 2017-07-07 ENCOUNTER — Encounter
Admission: RE | Disposition: A | Discharge status: Home / Self Care | Payer: Self-pay | Source: Ambulatory Visit | Attending: Gastroenterology

## 2017-07-07 ENCOUNTER — Ambulatory Visit: Payer: Medicare Other | Admitting: Anesthesiology

## 2017-07-07 ENCOUNTER — Ambulatory Visit
Admission: RE | Admit: 2017-07-07 | Discharge: 2017-07-07 | Disposition: A | Discharge status: Home / Self Care | Payer: Medicare Other | Source: Ambulatory Visit | Attending: Gastroenterology | Admitting: Gastroenterology

## 2017-07-07 ENCOUNTER — Encounter: Payer: Self-pay | Admitting: Anesthesiology

## 2017-07-07 DIAGNOSIS — K219 Gastro-esophageal reflux disease without esophagitis: Secondary | ICD-10-CM | POA: Insufficient documentation

## 2017-07-07 DIAGNOSIS — X58XXXA Exposure to other specified factors, initial encounter: Secondary | ICD-10-CM | POA: Diagnosis not present

## 2017-07-07 DIAGNOSIS — I10 Essential (primary) hypertension: Secondary | ICD-10-CM | POA: Insufficient documentation

## 2017-07-07 DIAGNOSIS — T18128A Food in esophagus causing other injury, initial encounter: Secondary | ICD-10-CM | POA: Diagnosis not present

## 2017-07-07 DIAGNOSIS — R1310 Dysphagia, unspecified: Secondary | ICD-10-CM | POA: Diagnosis not present

## 2017-07-07 DIAGNOSIS — I25119 Atherosclerotic heart disease of native coronary artery with unspecified angina pectoris: Secondary | ICD-10-CM | POA: Diagnosis not present

## 2017-07-07 DIAGNOSIS — E119 Type 2 diabetes mellitus without complications: Secondary | ICD-10-CM | POA: Diagnosis not present

## 2017-07-07 DIAGNOSIS — I2510 Atherosclerotic heart disease of native coronary artery without angina pectoris: Secondary | ICD-10-CM | POA: Diagnosis not present

## 2017-07-07 DIAGNOSIS — Z87891 Personal history of nicotine dependence: Secondary | ICD-10-CM | POA: Diagnosis not present

## 2017-07-07 DIAGNOSIS — G4730 Sleep apnea, unspecified: Secondary | ICD-10-CM | POA: Insufficient documentation

## 2017-07-07 DIAGNOSIS — G4733 Obstructive sleep apnea (adult) (pediatric): Secondary | ICD-10-CM | POA: Diagnosis not present

## 2017-07-07 DIAGNOSIS — T189XXA Foreign body of alimentary tract, part unspecified, initial encounter: Secondary | ICD-10-CM | POA: Diagnosis not present

## 2017-07-07 DIAGNOSIS — T18128D Food in esophagus causing other injury, subsequent encounter: Secondary | ICD-10-CM | POA: Diagnosis not present

## 2017-07-07 DIAGNOSIS — K222 Esophageal obstruction: Secondary | ICD-10-CM | POA: Insufficient documentation

## 2017-07-07 DIAGNOSIS — R042 Hemoptysis: Secondary | ICD-10-CM | POA: Diagnosis not present

## 2017-07-07 HISTORY — PX: ESOPHAGOGASTRODUODENOSCOPY (EGD) WITH PROPOFOL: SHX5813

## 2017-07-07 LAB — GLUCOSE, CAPILLARY: Glucose-Capillary: 117 mg/dL — ABNORMAL HIGH (ref 65–99)

## 2017-07-07 SURGERY — ESOPHAGOGASTRODUODENOSCOPY (EGD) WITH PROPOFOL
Anesthesia: General

## 2017-07-07 MED ORDER — MIDAZOLAM HCL 2 MG/2ML IJ SOLN
INTRAMUSCULAR | Status: DC | PRN
  Administered 2017-07-07: 2 mg via INTRAVENOUS

## 2017-07-07 MED ORDER — MIDAZOLAM HCL 2 MG/2ML IJ SOLN
INTRAMUSCULAR | Status: AC
  Filled 2017-07-07: qty 2

## 2017-07-07 MED ORDER — ONDANSETRON HCL 4 MG/2ML IJ SOLN: 4.0000 mg | Freq: Once | INTRAMUSCULAR | Status: DC | PRN

## 2017-07-07 MED ORDER — SUGAMMADEX SODIUM 200 MG/2ML IV SOLN
INTRAVENOUS | Status: DC | PRN
  Administered 2017-07-07: 174.4 mg via INTRAVENOUS

## 2017-07-07 MED ORDER — SUCCINYLCHOLINE CHLORIDE 20 MG/ML IJ SOLN
INTRAMUSCULAR | Status: DC | PRN
  Administered 2017-07-07: 120 mg via INTRAVENOUS

## 2017-07-07 MED ORDER — SODIUM CHLORIDE 0.9 % IV SOLN
INTRAVENOUS | Status: DC
  Administered 2017-07-07: 1000 mL via INTRAVENOUS

## 2017-07-07 MED ORDER — ROCURONIUM BROMIDE 100 MG/10ML IV SOLN
INTRAVENOUS | Status: DC | PRN
  Administered 2017-07-07: 10 mg via INTRAVENOUS
  Administered 2017-07-07: 30 mg via INTRAVENOUS

## 2017-07-07 MED ORDER — FENTANYL CITRATE (PF) 100 MCG/2ML IJ SOLN: 25.0000 ug | INTRAMUSCULAR | Status: DC | PRN

## 2017-07-07 MED ORDER — PROPOFOL 10 MG/ML IV BOLUS
INTRAVENOUS | Status: DC | PRN
  Administered 2017-07-07: 200 mg via INTRAVENOUS

## 2017-07-07 NOTE — Anesthesia Preprocedure Evaluation (Signed)
Anesthesia Evaluation  Patient identified by MRN, date of birth, ID band Patient awake    Reviewed: Allergy & Precautions, NPO status , Patient's Chart, lab work & pertinent test results, reviewed documented beta blocker date and time   Airway Mallampati: II  TM Distance: >3 FB     Dental  (+) Chipped   Pulmonary sleep apnea , former smoker,           Cardiovascular hypertension, Pt. on medications and Pt. on home beta blockers + angina + CAD       Neuro/Psych  Headaches,  Neuromuscular disease    GI/Hepatic hiatal hernia, GERD  ,  Endo/Other  diabetes, Type 2  Renal/GU      Musculoskeletal  (+) Arthritis ,   Abdominal   Peds  Hematology  (+) anemia ,   Anesthesia Other Findings Echo ok.  Reproductive/Obstetrics                             Anesthesia Physical Anesthesia Plan  ASA: III  Anesthesia Plan: General   Post-op Pain Management:    Induction: Intravenous  PONV Risk Score and Plan:   Airway Management Planned: Oral ETT  Additional Equipment:   Intra-op Plan:   Post-operative Plan:   Informed Consent: I have reviewed the patients History and Physical, chart, labs and discussed the procedure including the risks, benefits and alternatives for the proposed anesthesia with the patient or authorized representative who has indicated his/her understanding and acceptance.     Plan Discussed with: CRNA  Anesthesia Plan Comments:         Anesthesia Quick Evaluation

## 2017-07-07 NOTE — Op Note (Signed)
[facility] Gastroenterology Patient Name: Shane Mcknight Procedure Date: 07/07/2017 4:29 PM MRN: [id number] Account #: [id number] Date of Birth: [birth year] Admit Type: Outpatient Age: 67 Room: [facility] ENDO ROOM 2 Gender: Male Note Status: Finalized Procedure:            Upper GI endoscopy Indications:          Foreign body in the esophagus Providers:            Lucilla Lame MD, MD Referring MD:         Einar Pheasant, MD (Referring MD) Medicines:            Propofol per Anesthesia Complications:        No immediate complications. Procedure:            Pre-Anesthesia Assessment:                       - Prior to the procedure, a History and Physical was                        performed, and patient medications and allergies were                        reviewed. [name] tolerance of previous                        anesthesia was also reviewed. The risks and benefits of                        the procedure and the sedation options and risks were                        discussed with the patient. All questions were                        answered, and informed consent was obtained. Prior                        Anticoagulants: The patient has taken no previous                        anticoagulant or antiplatelet agents. ASA Grade                        Assessment: II - A patient with mild systemic disease.                        After reviewing the risks and benefits, the patient was                        deemed in satisfactory condition to undergo the                        procedure.                       After obtaining informed consent, the endoscope was                        passed under direct vision. Throughout the procedure,  the patient's blood pressure, pulse, and oxygen                        saturations were monitored continuously. The Endoscope                        was introduced through the mouth, and advanced to the            second part of duodenum. The upper GI endoscopy was                        accomplished without difficulty. The patient tolerated                        the procedure well. Findings:      Food was found at the gastroesophageal junction. Removal of food was       accomplished.      The stomach was normal.      The examined duodenum was normal.      One benign-appearing, intrinsic moderate stenosis was found at the       gastroesophageal junction. The stenosis was traversed. A TTS dilator was       passed through the scope. Dilation with a 12-13.5-15 mm balloon dilator       was performed to 15 mm. The dilation site was examined following       endoscope reinsertion and showed moderate improvement in luminal       narrowing. Impression:           - Food at the gastroesophageal junction. Removal was                        successful.                       - Normal stomach.                       - Normal examined duodenum.                       - Benign-appearing esophageal stenosis. Dilated. Recommendation:       - Discharge patient to home.                       - Soft diet today.                       - Continue present medications. Procedure Code(s):    --- Professional ---                       [phone], Esophagogastroduodenoscopy, flexible, transoral;                        with removal of foreign body(s)                       43249, Esophagogastroduodenoscopy, flexible, transoral;                        with transendoscopic balloon dilation of esophagus                        (less than 30 mm  diameter) Diagnosis Code(s):    --- Professional ---                       S08.138I, Food in esophagus causing other injury,                        initial encounter                       T18.108A, Unspecified foreign body in esophagus causing                        other injury, initial encounter                       K22.2, Esophageal obstruction CPT copyright 2017 American Medical  Association. All rights reserved. The codes documented in this report are preliminary and upon coder review may  be revised to meet current compliance requirements. Lucilla Lame MD, MD 07/07/2017 4:45:19 PM This report has been signed electronically. Number of Addenda: 0 Note Initiated On: 07/07/2017 4:29 PM      [facility]

## 2017-07-07 NOTE — Anesthesia Procedure Notes (Signed)
Procedure Name: Intubation Date/Time: 07/07/2017 4:40 PM Performed by: Nelda Marseille, CRNA Pre-anesthesia Checklist: Patient identified, Patient being monitored, Timeout performed, Emergency Drugs available and Suction available Patient Re-evaluated:Patient Re-evaluated prior to induction Oxygen Delivery Method: Circle system utilized Preoxygenation: Pre-oxygenation with 100% oxygen Induction Type: IV induction Ventilation: Mask ventilation without difficulty Laryngoscope Size: Mac, 3 and McGraph Grade View: Grade I Tube type: Oral Tube size: 7.0 mm Number of attempts: 1 Airway Equipment and Method: Stylet and Video-laryngoscopy Placement Confirmation: ETT inserted through vocal cords under direct vision,  positive ETCO2 and breath sounds checked- equal and bilateral Secured at: 21 cm Tube secured with: Tape Dental Injury: Teeth and Oropharynx as per pre-operative assessment  Difficulty Due To: Difficulty was anticipated

## 2017-07-07 NOTE — Anesthesia Postprocedure Evaluation (Signed)
Anesthesia Post Note  Patient: Schneider O Harney  Procedure(s) Performed: ESOPHAGOGASTRODUODENOSCOPY (EGD) WITH PROPOFOL (N/A )  Patient location during evaluation: Endoscopy Anesthesia Type: General Level of consciousness: awake and alert Pain management: pain level controlled Vital Signs Assessment: post-procedure vital signs reviewed and stable Respiratory status: spontaneous breathing, nonlabored ventilation, respiratory function stable and patient connected to nasal cannula oxygen Cardiovascular status: blood pressure returned to baseline and stable Postop Assessment: no apparent nausea or vomiting Anesthetic complications: no     Last Vitals:  Vitals:   07/07/17 1718 07/07/17 1728  BP: (!) 127/92 116/86  Pulse: 82 75  Resp: 18 18  Temp: 36.6 C   SpO2: 100%     Last Pain:  Vitals:   07/07/17 1728  TempSrc:   PainSc: 0-No pain                 Lucus Lambertson S

## 2017-07-07 NOTE — H&P (Signed)
Lucilla Lame, MD [address] [place], [place] [address] Phone:[phone] Fax : [phone]  Primary Care Physician:  Einar Pheasant, MD Primary Gastroenterologist:  Dr. Allen Norris  Pre-Procedure History & Physical: HPI:  Shane Mcknight is a 67 y.o. male is here for an endoscopy.   Past Medical History:  Diagnosis Date  . Abnormal liver function tests   . Allergic state   . Arthritis    spine, hands, shoulder with previous cuff tear  . CAD (coronary artery disease)    a.  11/2015 CT Chest/Abd: Coronray Ca2+ noted;  b. MV: EF 45-54%, med defect of mod severity in basal infsept, basal inf, mid infsept, and mid inf region-> infarct and peri-infarct ischemia-->intermediate risk; c. 05/2016 Cath/PCI: LM 40ost, LAD 30p, 12m, D1 60ost, LCX 60ost, OM1 99 (small->Med Rx), RCA 60p, 95d (2.5x15 Resolute Onyx DES), RPDA/RPLB min irregs, D2 60, EF 55-65%/  . Candidiasis of esophagus ([place])   . Colon polyp   . DDD (degenerative disc disease), lumbosacral   . Dysphagia   . Elevated transaminase level   . Esophageal stricture    a. s/p multiple dilations - last 03/2016.  . Ganglion cyst of finger of right hand   . GERD (gastroesophageal reflux disease)   . Guaiac positive stools   . Hiatal hernia   . HNP (herniated nucleus pulposus), lumbar   . Hypercholesterolemia   . Hypertension   . Lumbar radiculitis   . Mallory-Weiss tear    a. 11/2015.  . Migraine aura without headache    migraine, visual  . Psoriatic arthritis ([place])   . Sleep apnea    does not uses CPAP  . Type II diabetes mellitus ([place])     Past Surgical History:  Procedure Laterality Date  . ARTHROPLASTY     right thumb; left thumb  . BALLOON DILATION N/A 05/29/2015   Procedure: BALLOON DILATION;  Surgeon: Lollie Sails, MD;  Location: [facility] ENDOSCOPY;  Service: Endoscopy;  Laterality: N/A;  . BALLOON DILATION N/A 02/14/2017   Procedure: BALLOON DILATION;  Surgeon: Lollie Sails, MD;  Location: [facility]  ENDOSCOPY;  Service: Endoscopy;  Laterality: N/A;  . BILATERAL CARPAL TUNNEL RELEASE Bilateral 07/04/2017   Procedure: BILATERAL CARPAL TUNNEL RELEASE;  Surgeon: Leanor Kail, MD;  Location: [facility] ORS;  Service: Orthopedics;  Laterality: Bilateral;  . BLEPHAROPLASTY    . CARDIAC CATHETERIZATION     1 stent   . CARPAL TUNNEL RELEASE     right  . CHOLECYSTECTOMY    . COLONOSCOPY    . CORONARY STENT INTERVENTION N/A 06/14/2016   Procedure: Coronary Stent Intervention;  Surgeon: Wellington Hampshire, MD;  Location: [place] CV LAB;  Service: Cardiovascular;  Laterality: N/A;  . CYST EXCISION  04/14/2015   tendon sheath cyst excision; right ring finger cyst removed and trigger finger realease  . ESOPHAGOGASTRODUODENOSCOPY    . ESOPHAGOGASTRODUODENOSCOPY (EGD) WITH PROPOFOL N/A 05/29/2015   Procedure: ESOPHAGOGASTRODUODENOSCOPY (EGD) WITH PROPOFOL;  Surgeon: Lollie Sails, MD;  Location: [facility] ENDOSCOPY;  Service: Endoscopy;  Laterality: N/A;  . ESOPHAGOGASTRODUODENOSCOPY (EGD) WITH PROPOFOL N/A 07/04/2015   Procedure: ESOPHAGOGASTRODUODENOSCOPY (EGD) WITH PROPOFOL;  Surgeon: Lollie Sails, MD;  Location: [facility] ENDOSCOPY;  Service: Endoscopy;  Laterality: N/A;  . ESOPHAGOGASTRODUODENOSCOPY (EGD) WITH PROPOFOL N/A 01/04/2016   Procedure: ESOPHAGOGASTRODUODENOSCOPY (EGD) WITH PROPOFOL;  Surgeon: Lollie Sails, MD;  Location: [facility] ENDOSCOPY;  Service: Endoscopy;  Laterality: N/A;  . ESOPHAGOGASTRODUODENOSCOPY (EGD) WITH PROPOFOL N/A 03/22/2016   Procedure: ESOPHAGOGASTRODUODENOSCOPY (EGD) WITH  PROPOFOL;  Surgeon: Lollie Sails, MD;  Location: [facility] ENDOSCOPY;  Service: Endoscopy;  Laterality: N/A;  . ESOPHAGOGASTRODUODENOSCOPY (EGD) WITH PROPOFOL N/A 02/14/2017   Procedure: ESOPHAGOGASTRODUODENOSCOPY (EGD) WITH PROPOFOL;  Surgeon: Lollie Sails, MD;  Location: [facility] ENDOSCOPY;  Service: Endoscopy;  Laterality: N/A;  . ESOPHAGOGASTRODUODENOSCOPY (EGD) WITH PROPOFOL N/A 04/11/2017    Procedure: ESOPHAGOGASTRODUODENOSCOPY (EGD) WITH PROPOFOL;  Surgeon: Lollie Sails, MD;  Location: [facility] ENDOSCOPY;  Service: Endoscopy;  Laterality: N/A;  . EYE SURGERY    . FOREIGN BODY REMOVAL N/A 12/03/2015   Procedure: FOREIGN BODY REMOVAL;  Surgeon: Wilford Corner, MD;  Location: [facility] ENDOSCOPY;  Service: Endoscopy;  Laterality: N/A;  . HERNIA REPAIR    . LEFT HEART CATH Left 06/14/2016   Procedure: Left Heart Cath;  Surgeon: Wellington Hampshire, MD;  Location: [place] CV LAB;  Service: Cardiovascular;  Laterality: Left;  . LUMBAR DISC SURGERY     injections; herniated disc; percutaneous discectomy  . NASAL SINUS SURGERY  2008  . percutaneous lumbar discectomy    . ROTATOR CUFF REPAIR Right   . TONSILLECTOMY      Prior to Admission medications   Medication Sig Start Date End Date Taking? Authorizing Provider  aspirin EC 81 MG tablet Take 81 mg by mouth daily.    [provider]  atenolol (TENORMIN) 50 MG tablet TAKE 1 TABLET(50 MG) BY MOUTH TWICE DAILY 03/05/17   Einar Pheasant, MD  COSENTYX SENSOREADY PEN 150 MG/ML SOAJ Inject 300 mg into the skin every 30 (thirty) days. Due 12/16/2015 04/06/15   [provider]  FOLIC ACID PO Take 10 mg by mouth daily.     [provider]  gabapentin (NEURONTIN) 300 MG capsule TAKE 1 CAPSULE(300 MG) BY MOUTH TWICE DAILY Patient taking differently: TAKE 1 CAPSULE(300 MG) BY MOUTH AT BEDTIME AS NEEDED FOR PAIN 03/05/17   Einar Pheasant, MD  hydrochlorothiazide (HYDRODIURIL) 25 MG tablet TAKE 1 TABLET BY MOUTH EVERY DAY 03/05/17   Einar Pheasant, MD  losartan (COZAAR) 50 MG tablet TAKE 1 TABLET(50 MG) BY MOUTH DAILY. FOLLOW UP APPOINTMENT.THANKS 06/02/17   Einar Pheasant, MD  metFORMIN (GLUCOPHAGE) 500 MG tablet TAKE 4 TABLETS BY MOUTH EVERY EVENING. 06/16/17   Einar Pheasant, MD  Multiple Vitamin (MULTIVITAMIN) tablet Take 1 tablet by mouth daily.    [provider]  nitroGLYCERIN (NITROSTAT) 0.4 MG SL tablet  Place 1 tablet (0.4 mg total) under the tongue every 5 (five) minutes as needed. MAXIMUM OF 3 DOSES. 07/02/17   Theora Gianotti, NP  NORCO 5-325 MG tablet Take 1-2 tablets by mouth every 6 (six) hours as needed for up to 5 days for moderate pain. MAXIMUM TOTAL ACETAMINOPHEN DOSE IS 4000 MG PER DAY 07/04/17 07/09/17  Leanor Kail, MD  pantoprazole (PROTONIX) 40 MG tablet Take 1 tablet (40 mg total) by mouth 2 (two) times daily. 12/08/15   Dustin Flock, MD  prasugrel (EFFIENT) 10 MG TABS tablet TAKE 1 TABLET(10 MG) BY MOUTH DAILY 04/14/17   Wellington Hampshire, MD  rosuvastatin (CRESTOR) 20 MG tablet TAKE 1 TABLET BY MOUTH EVERY DAY AT 6: 00 PM 05/22/17   Theora Gianotti, NP  vitamin E 100 UNIT capsule Take 400 Units by mouth daily.    [provider]    Allergies as of 07/07/2017 - Review Complete 07/07/2017  Allergen Reaction Noted  . Ephedrine Other (See Comments) 01/30/2012  . Levsin [hyoscyamine sulfate] Other (See Comments) 01/30/2012  . Naprosyn [naproxen] Other (See Comments) 01/30/2012  .  Sudafed [pseudoephedrine hcl] Other (See Comments) 01/30/2012  . Vicodin [hydrocodone-acetaminophen] Other (See Comments) 06/30/2014  . Amoxicillin Itching and Rash 01/30/2012  . Other Rash 06/30/2014  . Penicillins Itching and Rash 01/30/2012  . Shellfish allergy Rash 01/30/2012  . Vancomycin Itching and Rash 01/30/2012    Family History  Problem Relation Age of Onset  . Lung cancer Father   . Cancer Father   . Hypertension Mother   . Aneurysm Son     Social History   Socioeconomic History  . Marital status: Married    Spouse name: Not on file  . Number of children: Not on file  . Years of education: Not on file  . Highest education level: Not on file  Occupational History  . Not on file  Social Needs  . Financial resource strain: Not on file  . Food insecurity:    Worry: Not on file    Inability: Not on file  . Transportation needs:    Medical: Not on  file    Non-medical: Not on file  Tobacco Use  . Smoking status: Former Smoker    Types: Cigarettes    Last attempt to quit: 07/06/1982    Years since quitting: 35.0  . Smokeless tobacco: Never Used  Substance and Sexual Activity  . Alcohol use: Yes    Alcohol/week: 0.0 oz    Comment: rarely  . Drug use: No  . Sexual activity: Not on file  Lifestyle  . Physical activity:    Days per week: Not on file    Minutes per session: Not on file  . Stress: Not on file  Relationships  . Social connections:    Talks on phone: Not on file    Gets together: Not on file    Attends religious service: Not on file    Active member of club or organization: Not on file    Attends meetings of clubs or organizations: Not on file    Relationship status: Not on file  . Intimate partner violence:    Fear of current or ex partner: Not on file    Emotionally abused: Not on file    Physically abused: Not on file    Forced sexual activity: Not on file  Other Topics Concern  . Not on file  Social History Narrative   Lives locally with wife.  Active but does not routinely exercise.    Review of Systems: See HPI, otherwise negative ROS  Physical Exam: BP (!) 162/68   Pulse 74   Temp 97.6 F (36.4 C) (Tympanic)   Resp 18   Wt 192 lb 4.8 oz (87.2 kg)   BMI 28.81 kg/m  General:   Alert,  pleasant and cooperative in NAD Head:  Normocephalic and atraumatic. Neck:  Supple; no masses or thyromegaly. Lungs:  Clear throughout to auscultation.    Heart:  Regular rate and rhythm. Abdomen:  Soft, nontender and nondistended. Normal bowel sounds, without guarding, and without rebound.   Neurologic:  Alert and  oriented x4;  grossly normal neurologically.  Impression/Plan: Perrin Jenetta Downer Wilmot is here for an endoscopy to be performed for foreign body  Risks, benefits, limitations, and alternatives regarding  endoscopy have been reviewed with the patient.  Questions have been answered.  All parties  agreeable.   Lucilla Lame, MD  07/07/2017, 4:20 PM

## 2017-07-07 NOTE — Transfer of Care (Signed)
Immediate Anesthesia Transfer of Care Note  Patient: Shane Mcknight  Procedure(s) Performed: ESOPHAGOGASTRODUODENOSCOPY (EGD) WITH PROPOFOL (N/A )  Patient Location: PACU  Anesthesia Type:General  Level of Consciousness: sedated  Airway & Oxygen Therapy: Patient Spontanous Breathing and Patient connected to face mask oxygen  Post-op Assessment: Report given to RN and Post -op Vital signs reviewed and stable  Post vital signs: Reviewed and stable  Last Vitals:  Vitals Value Taken Time  BP    Temp    Pulse    Resp    SpO2      Last Pain:  Vitals:   07/07/17 1619  TempSrc: Tympanic  PainSc: 0-No pain         Complications: No apparent anesthesia complications

## 2017-07-07 NOTE — Anesthesia Post-op Follow-up Note (Signed)
Anesthesia QCDR form completed.        

## 2017-07-09 ENCOUNTER — Telehealth: Payer: Self-pay | Admitting: Internal Medicine

## 2017-07-09 NOTE — Telephone Encounter (Signed)
Copied from [place] [id number]. Topic: Quick Communication - Rx Refill/Question >> Jul 09, 2017 11:33 AM Selinda Flavin B, NT wrote: Medication: hydrochlorothiazide (HYDRODIURIL) 25 MG tablet (90 days)  Has the patient contacted their pharmacy? Yes.   (Agent: If no, request that the patient contact the pharmacy for the refill.) (Agent: If yes, when and what did the pharmacy advise?)  Preferred Pharmacy (with phone number or street name): [facility]  Agent: Please be advised that RX refills may take up to 3 business days. We ask that you follow-up with your pharmacy.

## 2017-07-10 ENCOUNTER — Encounter: Payer: Self-pay | Admitting: Gastroenterology

## 2017-07-10 MED ORDER — HYDROCHLOROTHIAZIDE 25 MG PO TABS: 25.0000 mg | ORAL_TABLET | Freq: Every day | ORAL | 3 refills | Status: DC

## 2017-07-10 NOTE — Addendum Note (Signed)
Addended by: Elliot Cousin on: 07/10/2017 03:36 PM   Modules accepted: Orders

## 2017-07-14 DIAGNOSIS — Z9889 Other specified postprocedural states: Secondary | ICD-10-CM | POA: Insufficient documentation

## 2017-07-16 ENCOUNTER — Encounter: Payer: Self-pay | Admitting: Internal Medicine

## 2017-07-16 ENCOUNTER — Other Ambulatory Visit: Payer: Self-pay

## 2017-07-16 ENCOUNTER — Telehealth: Payer: Self-pay | Admitting: Internal Medicine

## 2017-07-16 MED ORDER — LOSARTAN POTASSIUM 50 MG PO TABS: ORAL_TABLET | ORAL | 1 refills | Status: DC

## 2017-07-16 MED ORDER — PANTOPRAZOLE SODIUM 40 MG PO TBEC: 40.0000 mg | DELAYED_RELEASE_TABLET | Freq: Two times a day (BID) | ORAL | 1 refills | Status: DC

## 2017-07-16 MED ORDER — METFORMIN HCL 500 MG PO TABS: ORAL_TABLET | ORAL | 1 refills | Status: DC

## 2017-07-16 MED ORDER — HYDROCHLOROTHIAZIDE 25 MG PO TABS: 25.0000 mg | ORAL_TABLET | Freq: Every day | ORAL | 0 refills | Status: DC

## 2017-07-16 MED ORDER — ATENOLOL 50 MG PO TABS: ORAL_TABLET | ORAL | 3 refills | Status: DC

## 2017-07-16 NOTE — Telephone Encounter (Signed)
Request refill of HCTZ 25 mg, # 30 at local pharmacy, until his mail order supply arrives.  Last office visit 06/27/17; PCP Dr. Nicki Reaper.  Sent in 30 day refill to [facility] in [place], [place], until mail order arrives.  Pt. notified of the above.  Verb. understanding.     Message from [facility] sent at 07/16/2017 4:43 PM EDT   Summary: Pt request refill   Pt states he was informed by [facility] that the refill for hydrochlorothiazide (HYDRODIURIL) 25 MG tablet will not be filled until 07/31/17. Pt states he can not go without his heart medication and requests a 30 day supply to be sent to [facility] [place] [phone] (Phone) [phone] (Fax)

## 2017-07-16 NOTE — Telephone Encounter (Signed)
This encounter was created in error - please disregard.

## 2017-07-18 ENCOUNTER — Other Ambulatory Visit: Payer: Self-pay

## 2017-07-18 MED ORDER — METFORMIN HCL 500 MG PO TABS: ORAL_TABLET | ORAL | 1 refills | Status: DC

## 2017-07-18 MED ORDER — PANTOPRAZOLE SODIUM 40 MG PO TBEC: 40.0000 mg | DELAYED_RELEASE_TABLET | Freq: Two times a day (BID) | ORAL | 1 refills | Status: DC

## 2017-07-31 ENCOUNTER — Telehealth: Payer: Self-pay | Admitting: Radiology

## 2017-07-31 ENCOUNTER — Other Ambulatory Visit: Payer: Self-pay | Admitting: Internal Medicine

## 2017-07-31 DIAGNOSIS — D649 Anemia, unspecified: Secondary | ICD-10-CM

## 2017-07-31 NOTE — Telephone Encounter (Signed)
Pt coming in for labs tomorrow, please place future orders. Thank you.  

## 2017-07-31 NOTE — Telephone Encounter (Signed)
Orders placed for f/u labs.  

## 2017-07-31 NOTE — Progress Notes (Signed)
Order placed for f/u labs.  

## 2017-08-01 ENCOUNTER — Other Ambulatory Visit (INDEPENDENT_AMBULATORY_CARE_PROVIDER_SITE_OTHER): Payer: Medicare Other

## 2017-08-01 DIAGNOSIS — D649 Anemia, unspecified: Secondary | ICD-10-CM | POA: Diagnosis not present

## 2017-08-01 LAB — CBC WITH DIFFERENTIAL/PLATELET
BASOS ABS: 0 10*3/uL (ref 0.0–0.1)
Basophils Relative: 0.7 % (ref 0.0–3.0)
EOS PCT: 6.9 % — AB (ref 0.0–5.0)
Eosinophils Absolute: 0.4 10*3/uL (ref 0.0–0.7)
HEMATOCRIT: 35 % — AB (ref 39.0–52.0)
Hemoglobin: 12 g/dL — ABNORMAL LOW (ref 13.0–17.0)
LYMPHS ABS: 1.8 10*3/uL (ref 0.7–4.0)
Lymphocytes Relative: 30.5 % (ref 12.0–46.0)
MCHC: 34.2 g/dL (ref 30.0–36.0)
MCV: 82.8 fl (ref 78.0–100.0)
MONOS PCT: 13.5 % — AB (ref 3.0–12.0)
Monocytes Absolute: 0.8 10*3/uL (ref 0.1–1.0)
NEUTROS PCT: 48.4 % (ref 43.0–77.0)
Neutro Abs: 2.9 10*3/uL (ref 1.4–7.7)
Platelets: 210 10*3/uL (ref 150.0–400.0)
RBC: 4.22 Mil/uL (ref 4.22–5.81)
RDW: 15.5 % (ref 11.5–15.5)
WBC: 6 10*3/uL (ref 4.0–10.5)

## 2017-08-01 LAB — FERRITIN: FERRITIN: 6.4 ng/mL — AB (ref 22.0–322.0)

## 2017-08-01 LAB — VITAMIN B12: VITAMIN B 12: 151 pg/mL — AB (ref 211–911)

## 2017-08-06 ENCOUNTER — Other Ambulatory Visit: Payer: Self-pay

## 2017-08-06 MED ORDER — INTEGRA 62.5-62.5-40-3 MG PO CAPS: 1.0000 | ORAL_CAPSULE | Freq: Every day | ORAL | 1 refills | Status: DC

## 2017-08-08 ENCOUNTER — Other Ambulatory Visit: Payer: Self-pay

## 2017-08-08 ENCOUNTER — Ambulatory Visit (INDEPENDENT_AMBULATORY_CARE_PROVIDER_SITE_OTHER): Payer: Medicare Other

## 2017-08-08 DIAGNOSIS — E538 Deficiency of other specified B group vitamins: Secondary | ICD-10-CM

## 2017-08-08 MED ORDER — INTEGRA 62.5-62.5-40-3 MG PO CAPS: 1.0000 | ORAL_CAPSULE | Freq: Every day | ORAL | 1 refills | Status: DC

## 2017-08-08 MED ORDER — CYANOCOBALAMIN 1000 MCG/ML IJ SOLN: INTRAMUSCULAR | 3 refills | Status: DC

## 2017-08-08 MED ORDER — "SYRINGE/NEEDLE (DISP) 25G X 5/8"" 3 ML MISC": 11 refills | Status: DC

## 2017-08-08 MED ORDER — "NEEDLE (DISP) 25G X 1"" MISC": 11 refills | Status: DC

## 2017-08-08 MED ORDER — CYANOCOBALAMIN 1000 MCG/ML IJ SOLN
1000.0000 ug | Freq: Once | INTRAMUSCULAR | Status: AC
  Administered 2017-08-08: 1000 ug via INTRAMUSCULAR

## 2017-08-08 NOTE — Progress Notes (Addendum)
Patient came in today to get B12 injection. Patient was instructed how to administer his own after today. Patient understood and had no questions, complaints, or concerns. Patient was given injection in Left thigh and tolerated well.   Reviewed.  Dr Nicki Reaper

## 2017-08-22 ENCOUNTER — Other Ambulatory Visit: Payer: Self-pay | Admitting: *Deleted

## 2017-08-22 MED ORDER — ROSUVASTATIN CALCIUM 20 MG PO TABS: ORAL_TABLET | ORAL | 2 refills | Status: DC

## 2017-08-26 ENCOUNTER — Telehealth: Payer: Self-pay

## 2017-08-26 MED ORDER — ROSUVASTATIN CALCIUM 20 MG PO TABS: ORAL_TABLET | ORAL | 2 refills | Status: DC

## 2017-08-26 NOTE — Telephone Encounter (Signed)
Medication refill pt has an appointment 12/19, 90 day 1 refill

## 2017-08-28 DIAGNOSIS — L409 Psoriasis, unspecified: Secondary | ICD-10-CM | POA: Diagnosis not present

## 2017-08-28 DIAGNOSIS — R252 Cramp and spasm: Secondary | ICD-10-CM | POA: Diagnosis not present

## 2017-08-28 DIAGNOSIS — G5601 Carpal tunnel syndrome, right upper limb: Secondary | ICD-10-CM | POA: Diagnosis not present

## 2017-08-28 DIAGNOSIS — G5602 Carpal tunnel syndrome, left upper limb: Secondary | ICD-10-CM | POA: Diagnosis not present

## 2017-08-28 DIAGNOSIS — L4050 Arthropathic psoriasis, unspecified: Secondary | ICD-10-CM | POA: Diagnosis not present

## 2017-10-08 ENCOUNTER — Encounter: Payer: Self-pay | Admitting: Internal Medicine

## 2017-10-09 ENCOUNTER — Other Ambulatory Visit: Payer: Self-pay

## 2017-10-09 MED ORDER — ATENOLOL 50 MG PO TABS: ORAL_TABLET | ORAL | 1 refills | Status: DC

## 2017-10-09 MED ORDER — GABAPENTIN 300 MG PO CAPS: ORAL_CAPSULE | ORAL | 1 refills | Status: DC

## 2017-10-27 ENCOUNTER — Telehealth: Payer: Self-pay | Admitting: Radiology

## 2017-10-27 NOTE — Telephone Encounter (Signed)
Pt coming in for labs tomorrow, please place future orders. Thank you.  

## 2017-10-28 ENCOUNTER — Other Ambulatory Visit: Payer: Self-pay | Admitting: Internal Medicine

## 2017-10-28 ENCOUNTER — Other Ambulatory Visit: Payer: Medicare Other

## 2017-10-28 DIAGNOSIS — R945 Abnormal results of liver function studies: Secondary | ICD-10-CM

## 2017-10-28 DIAGNOSIS — D649 Anemia, unspecified: Secondary | ICD-10-CM

## 2017-10-28 DIAGNOSIS — E119 Type 2 diabetes mellitus without complications: Secondary | ICD-10-CM

## 2017-10-28 DIAGNOSIS — E785 Hyperlipidemia, unspecified: Secondary | ICD-10-CM

## 2017-10-28 DIAGNOSIS — I10 Essential (primary) hypertension: Secondary | ICD-10-CM

## 2017-10-28 DIAGNOSIS — R7989 Other specified abnormal findings of blood chemistry: Secondary | ICD-10-CM

## 2017-10-28 NOTE — Telephone Encounter (Signed)
Orders placed for f/u labs.  

## 2017-10-28 NOTE — Progress Notes (Signed)
Orders placed for f/u labs.  

## 2017-10-29 ENCOUNTER — Other Ambulatory Visit (INDEPENDENT_AMBULATORY_CARE_PROVIDER_SITE_OTHER): Payer: Medicare Other

## 2017-10-29 DIAGNOSIS — D649 Anemia, unspecified: Secondary | ICD-10-CM | POA: Diagnosis not present

## 2017-10-29 DIAGNOSIS — E785 Hyperlipidemia, unspecified: Secondary | ICD-10-CM

## 2017-10-29 DIAGNOSIS — E119 Type 2 diabetes mellitus without complications: Secondary | ICD-10-CM

## 2017-10-29 LAB — CBC WITH DIFFERENTIAL/PLATELET
BASOS PCT: 0.4 % (ref 0.0–3.0)
Basophils Absolute: 0 10*3/uL (ref 0.0–0.1)
Eosinophils Absolute: 0.4 10*3/uL (ref 0.0–0.7)
Eosinophils Relative: 6.1 % — ABNORMAL HIGH (ref 0.0–5.0)
HEMATOCRIT: 39.5 % (ref 39.0–52.0)
Hemoglobin: 13.8 g/dL (ref 13.0–17.0)
LYMPHS PCT: 26.1 % (ref 12.0–46.0)
Lymphs Abs: 1.7 10*3/uL (ref 0.7–4.0)
MCHC: 34.9 g/dL (ref 30.0–36.0)
MCV: 86.4 fl (ref 78.0–100.0)
MONOS PCT: 12.9 % — AB (ref 3.0–12.0)
Monocytes Absolute: 0.8 10*3/uL (ref 0.1–1.0)
NEUTROS ABS: 3.5 10*3/uL (ref 1.4–7.7)
Neutrophils Relative %: 54.5 % (ref 43.0–77.0)
PLATELETS: 173 10*3/uL (ref 150.0–400.0)
RBC: 4.57 Mil/uL (ref 4.22–5.81)
RDW: 16.7 % — AB (ref 11.5–15.5)
WBC: 6.5 10*3/uL (ref 4.0–10.5)

## 2017-10-29 LAB — HEPATIC FUNCTION PANEL
ALT: 18 U/L (ref 0–53)
AST: 14 U/L (ref 0–37)
Albumin: 4.5 g/dL (ref 3.5–5.2)
Alkaline Phosphatase: 44 U/L (ref 39–117)
BILIRUBIN TOTAL: 0.5 mg/dL (ref 0.2–1.2)
Bilirubin, Direct: 0.1 mg/dL (ref 0.0–0.3)
Total Protein: 7.5 g/dL (ref 6.0–8.3)

## 2017-10-29 LAB — BASIC METABOLIC PANEL
BUN: 12 mg/dL (ref 6–23)
CALCIUM: 9.6 mg/dL (ref 8.4–10.5)
CO2: 31 mEq/L (ref 19–32)
Chloride: 99 mEq/L (ref 96–112)
Creatinine, Ser: 0.93 mg/dL (ref 0.40–1.50)
GFR: 85.6 mL/min (ref >60.00)
GLUCOSE: 159 mg/dL — AB (ref 70–99)
Potassium: 3.7 mEq/L (ref 3.5–5.1)
SODIUM: 139 meq/L (ref 135–145)

## 2017-10-29 LAB — LIPID PANEL
CHOLESTEROL: 105 mg/dL (ref 0–200)
HDL: 33.9 mg/dL — AB (ref >39.00)
LDL Cholesterol: 41 mg/dL (ref 0–99)
NONHDL: 70.83
Total CHOL/HDL Ratio: 3
Triglycerides: 150 mg/dL — ABNORMAL HIGH (ref 0.0–149.0)
VLDL: 30 mg/dL (ref 0.0–40.0)

## 2017-10-29 LAB — FERRITIN: Ferritin: 13.3 ng/mL — ABNORMAL LOW (ref 22.0–322.0)

## 2017-10-29 LAB — HEMOGLOBIN A1C: Hgb A1c MFr Bld: 6.4 % (ref 4.6–6.5)

## 2017-10-30 ENCOUNTER — Encounter: Payer: Self-pay | Admitting: Internal Medicine

## 2017-10-31 ENCOUNTER — Encounter: Payer: Self-pay | Admitting: Internal Medicine

## 2017-10-31 ENCOUNTER — Ambulatory Visit (INDEPENDENT_AMBULATORY_CARE_PROVIDER_SITE_OTHER): Payer: Medicare Other | Admitting: Internal Medicine

## 2017-10-31 DIAGNOSIS — E785 Hyperlipidemia, unspecified: Secondary | ICD-10-CM

## 2017-10-31 DIAGNOSIS — R945 Abnormal results of liver function studies: Secondary | ICD-10-CM | POA: Diagnosis not present

## 2017-10-31 DIAGNOSIS — D649 Anemia, unspecified: Secondary | ICD-10-CM | POA: Diagnosis not present

## 2017-10-31 DIAGNOSIS — R1319 Other dysphagia: Secondary | ICD-10-CM

## 2017-10-31 DIAGNOSIS — I10 Essential (primary) hypertension: Secondary | ICD-10-CM

## 2017-10-31 DIAGNOSIS — R7989 Other specified abnormal findings of blood chemistry: Secondary | ICD-10-CM

## 2017-10-31 DIAGNOSIS — E119 Type 2 diabetes mellitus without complications: Secondary | ICD-10-CM

## 2017-10-31 DIAGNOSIS — I2510 Atherosclerotic heart disease of native coronary artery without angina pectoris: Secondary | ICD-10-CM

## 2017-10-31 DIAGNOSIS — R1310 Dysphagia, unspecified: Secondary | ICD-10-CM

## 2017-10-31 DIAGNOSIS — L4050 Arthropathic psoriasis, unspecified: Secondary | ICD-10-CM

## 2017-10-31 DIAGNOSIS — K4090 Unilateral inguinal hernia, without obstruction or gangrene, not specified as recurrent: Secondary | ICD-10-CM

## 2017-10-31 NOTE — Progress Notes (Signed)
Patient ID: Shane Mcknight, male   DOB: Dec 10, 1950, 67 y.o.   MRN: [id number]   Subjective:    Patient ID: Shane Mcknight, male    DOB: 09/27/1950, 67 y.o.   MRN: [id number]  HPI  Patient here for a scheduled follow up.  He sees Dr Jefm Bryant for f/u psoriatic arthritis.  On consentyx.  Stable.  Last evalauted 08/2017. Recommended f/u in 6 months.  S/p CTS 07/04/17.  Doing relatively well.  Still some discomfort - left hand.   Discussed f/u with ortho.  Has been followed by GI for dysphagia.  S/p EGD 07/07/17.  Still with swallowing issues.  Discussed importance of taking small bites, chewing food well and eating slowly.  Will schedule f/u with GI.  No chest pain.  No sob.  Does report concern regarding left inguinal hernia.  More fullness. No pain.  Request referral for evaluation.  Bowels moving.  No urine change.     Past Medical History:  Diagnosis Date  . Abnormal liver function tests   . Allergic state   . Arthritis    spine, hands, shoulder with previous cuff tear  . CAD (coronary artery disease)    a.  11/2015 CT Chest/Abd: Coronray Ca2+ noted;  b. MV: EF 45-54%, med defect of mod severity in basal infsept, basal inf, mid infsept, and mid inf region-> infarct and peri-infarct ischemia-->intermediate risk; c. 05/2016 Cath/PCI: LM 40ost, LAD 30p, 44m D1 60ost, LCX 60ost, OM1 99 (small->Med Rx), RCA 60p, 95d (2.5x15 Resolute Onyx DES), RPDA/RPLB min irregs, D2 60, EF 55-65%/  . Candidiasis of esophagus ([place]   . Colon polyp   . DDD (degenerative disc disease), lumbosacral   . Dysphagia   . Elevated transaminase level   . Esophageal stricture    a. s/p multiple dilations - last 03/2016.  . Ganglion cyst of finger of right hand   . GERD (gastroesophageal reflux disease)   . Guaiac positive stools   . Hiatal hernia   . HNP (herniated nucleus pulposus), lumbar   . Hypercholesterolemia   . Hypertension   . Lumbar radiculitis   . Mallory-Weiss tear    a. 11/2015.  . 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 Psoriatic arthritis ([place]   . Sleep apnea    does not uses CPAP  . Type II diabetes mellitus ([place]    Past Surgical History:  Procedure Laterality Date  . ARTHROPLASTY     right thumb; left thumb  . BALLOON DILATION N/A 05/29/2015   Procedure: BALLOON DILATION;  Surgeon: MLollie Sails MD;  [facility]  Service: Endoscopy;  Laterality: N/A;  . BALLOON DILATION N/A 02/14/2017   Procedure: BALLOON DILATION;  Surgeon: SLollie Sails MD;  [facility];  Service: Endoscopy;  Laterality: N/A;  . BILATERAL CARPAL TUNNEL RELEASE Bilateral 07/04/2017   Procedure: BILATERAL CARPAL TUNNEL RELEASE;  Surgeon: KLeanor Kail MD;  Location: [facility] ORS;  Service: Orthopedics;  Laterality: Bilateral;  . BLEPHAROPLASTY    . CARDIAC CATHETERIZATION     1 stent   . CARPAL TUNNEL RELEASE     right  . CHOLECYSTECTOMY    . COLONOSCOPY    . CORONARY STENT INTERVENTION N/A 06/14/2016   Procedure: Coronary Stent Intervention;  Surgeon: AWellington Hampshire MD;  [facility];  Service: Cardiovascular;  Laterality: N/A;  . CYST EXCISION  04/14/2015   tendon sheath cyst excision; right ring finger cyst removed and trigger finger realease  .  ESOPHAGOGASTRODUODENOSCOPY    . ESOPHAGOGASTRODUODENOSCOPY (EGD) WITH PROPOFOL N/A 05/29/2015   Procedure: ESOPHAGOGASTRODUODENOSCOPY (EGD) WITH PROPOFOL;  Surgeon: Lollie Sails, MD;  Location[facility] ENDOSCOPY;  Service: Endoscopy;  Laterality: N/A;  . ESOPHAGOGASTRODUODENOSCOPY (EGD) WITH PROPOFOL N/A 07/04/2015   Procedure: ESOPHAGOGASTRODUODENOSCOPY (EGD) WITH PROPOFOL;  Surgeon: Lollie Sails, MD;  Location[facility] END[facility]COPY;  Service: Endoscopy;  Laterality: N/A;  . ESOPHAGOGASTRODUODENOSCOPY (EGD) WITH PROPOFOL N/A 01/04/2016   Procedure: ESOPHAGOGASTRODUODENOSCOPY (EGD) WITH PROPOFOL;  Surgeon: Lollie Sails, MD;  Location: [facility] ENDOSCOPY;  Service: Endoscopy;  Laterality: N/A;  .  ESOPHAGOGASTRODUODENOSCOPY (EGD) WITH PROPOFOL N/A 03/22/2016   Procedure: ESOPHAGOGASTRODUODENOSCOPY (EGD) WITH PROPOFOL;  Surgeon: Lollie Sails, MD;  Location: [facility] ENDOSCOPY;  Service: Endoscopy;  Laterality: N/A;  . ESOPHAGOGASTRODUODENOSCOPY (EGD) WITH PROPOFOL N/A 02/14/2017   Procedure: ESOPHAGOGASTRODUODENOSCOPY (EGD) WITH PROPOFOL;  Surgeon: Lollie Sails, MD;  Location: [facility] ENDOSCOPY;  Service: Endoscopy;  Laterality: N/A;  . ESOPHAGOGASTRODUODENOSCOPY (EGD) WITH PROPOFOL N/A 04/11/2017   Procedure: ESOPHAGOGASTRODUODENOSCOPY (EGD) WITH PROPOFOL;  Surgeon: Lollie Sails, MD;  Location: [facility] ENDOSCOPY;  Service: Endoscopy;  Laterality: N/A;  . ESOPHAGOGASTRODUODENOSCOPY (EGD) WITH PROPOFOL N/A 07/07/2017   Procedure: ESOPHAGOGASTRODUODENOSCOPY (EGD) WITH PROPOFOL;  Surgeon: Lucilla Lame, MD;  Location: [facility] ENDOSCOPY;  Service: Endoscopy;  Laterality: N/A;  . EYE SURGERY    . FOREIGN BODY REMOVAL N/A 12/03/2015   Procedure: FOREIGN BODY REMOVAL;  Surgeon: Wilford Corner, MD;  Location[facility] ENDOSCOPY;  Service: Endoscopy;  Laterality: N/A;  . HERNIA REPAIR    . LEFT HEART CATH Left 06/14/2016   Procedure: Left Heart Cath;  Surgeon: Wellington Hampshire, MD;  Location: [place] CV LAB;  Service: Cardiovascular;  Laterality: Left;  . LUMBAR DISC SURGERY     injections; herniated disc; percutaneous discectomy  . NASAL SINUS SURGERY  2008  . percutaneous lumbar discectomy    . ROTATOR CUFF REPAIR Right   . TONSILLECTOMY     Family History  Problem Relation Age of Onset  . Lung cancer Father   . Cancer Father   . Hypertension Mother   . Aneurysm Son    Social History   Socioeconomic History  . Marital status: Married    Spouse name: Not on file  . Number of children: Not on file  . Years of education: Not on file  . Highest education level: Not on file  Occupational History  . Not on file  Social Needs  . Financial resource strain: Not on file  . Food  insecurity:    Worry: Not on file    Inability: Not on file  . Transportation needs:    Medical: Not on file    Non-medical: Not on file  Tobacco Use  . Smoking status: Former Smoker    Types: Cigarettes    Last attempt to quit: 07/06/1982    Years since quitting: 35.3  . Smokeless tobacco: Never Used  Substance and Sexual Activity  . Alcohol use: Yes    Alcohol/week: 0.0 standard drinks    Comment: rarely  . Drug use: No  . Sexual activity: Not on file  Lifestyle  . Physical activity:    Days per week: Not on file    Minutes per session: Not on file  . Stress: Not on file  Relationships  . Social connections:    Talks on phone: Not on file    Gets together: Not on file    Attends religious service: Not on file    Active member of  club or organization: Not on file    Attends meetings of clubs or organizations: Not on file    Relationship status: Not on file  Other Topics Concern  . Not on file  Social History Narrative   Lives locally with wife.  Active but does not routinely exercise.    Outpatient Encounter Medications as of 10/31/2017  Medication Sig  . aspirin EC 81 MG tablet Take 81 mg by mouth daily.  Marland Kitchen atenolol (TENORMIN) 50 MG tablet TAKE 1 TABLET(50 MG) BY MOUTH TWICE DAILY  . COSENTYX SENSOREADY PEN 150 MG/ML SOAJ Inject 300 mg into the skin every 30 (thirty) days. Due 12/16/2015  . cyanocobalamin (,VITAMIN B-12,) 1000 MCG/ML injection Inject 1 mL into the muscle once a week for the next 3 weeks and then once every 30 days.  . Fe Fum-FePoly-Vit C-Vit B3 (INTEGRA) 62.5-62.5-40-3 MG CAPS Take 1 capsule by mouth daily.  Marland Kitchen FOLIC ACID PO Take 10 mg by mouth daily.   Marland Kitchen gabapentin (NEURONTIN) 300 MG capsule TAKE 1 CAPSULE(300 MG) BY MOUTH AT BEDTIME AS NEEDED FOR PAIN  . hydrochlorothiazide (HYDRODIURIL) 25 MG tablet Take 1 tablet (25 mg total) by mouth daily.  Marland Kitchen losartan (COZAAR) 50 MG tablet TAKE 1 TABLET(50 MG) BY MOUTH DAILY. FOLLOW UP APPOINTMENT.THANKS  .  metFORMIN (GLUCOPHAGE) 500 MG tablet TAKE 4 TABLETS BY MOUTH EVERY EVENING.  . Multiple Vitamin (MULTIVITAMIN) tablet Take 1 tablet by mouth daily.  . nitroGLYCERIN (NITROSTAT) 0.4 MG SL tablet Place 1 tablet (0.4 mg total) under the tongue every 5 (five) minutes as needed. MAXIMUM OF 3 DOSES.  Marland Kitchen pantoprazole (PROTONIX) 40 MG tablet Take 1 tablet (40 mg total) by mouth 2 (two) times daily.  . rosuvastatin (CRESTOR) 20 MG tablet TAKE 1 TABLET BY MOUTH EVERY DAY AT 6: 00 PM  . SYRINGE-NEEDLE, DISP, 3 ML 25G X 5/8" 3 ML MISC Use as instructed with B12 injection.  . vitamin E 100 UNIT capsule Take 400 Units by mouth daily.  . [DISCONTINUED] NEEDLE, DISP, 25 G 25G X 1" MISC Use as directed with B12 injection  . [DISCONTINUED] prasugrel (EFFIENT) 10 MG TABS tablet TAKE 1 TABLET(10 MG) BY MOUTH DAILY  . [DISCONTINUED] hydrochlorothiazide (HYDRODIURIL) 25 MG tablet Take 1 tablet (25 mg total) by mouth daily.   No facility-administered encounter medications on file as of 10/31/2017.     Review of Systems  Constitutional: Negative for appetite change and unexpected weight change.  HENT: Negative for congestion and sinus pressure.   Respiratory: Negative for cough, chest tightness and shortness of breath.   Cardiovascular: Negative for chest pain, palpitations and leg swelling.  Gastrointestinal: Negative for abdominal pain, diarrhea, nausea and vomiting.       Dysphagia.    Genitourinary: Negative for difficulty urinating and dysuria.  Musculoskeletal: Negative for myalgias.       Left hand pain s/p CTS.    Skin: Negative for color change and rash.  Neurological: Negative for dizziness, light-headedness and headaches.  Psychiatric/Behavioral: Negative for agitation and dysphoric mood.       Objective:    Physical Exam  Constitutional: He appears well-developed and well-nourished. No distress.  HENT:  Nose: Nose normal.  Mouth/Throat: Oropharynx is clear and moist.  Neck: Neck supple. No  thyromegaly present.  Cardiovascular: Normal rate and regular rhythm.  Pulmonary/Chest: Effort normal and breath sounds normal. No respiratory distress.  Abdominal: Soft. Bowel sounds are normal. There is no tenderness.  Fullness - left inguinal - no pain.  Musculoskeletal: He exhibits no edema or tenderness.  Lymphadenopathy:    He has no cervical adenopathy.  Skin: No rash noted. No erythema.  Psychiatric: He has a normal mood and affect. His behavior is normal.    BP 118/66 (BP Location: Left Arm, Patient Position: Sitting, Cuff Size: Normal)   Pulse 69   Temp 98.2 F (36.8 C) (Oral)   Resp 18   Wt 192 lb 12.8 oz (87.5 kg)   SpO2 98%   BMI 28.89 kg/m  Wt Readings from Last 3 Encounters:  10/31/17 192 lb 12.8 oz (87.5 kg)  07/07/17 192 lb 4.8 oz (87.2 kg)  07/04/17 195 lb 4 oz (88.6 kg)     Lab Results  Component Value Date   WBC 6.5 10/29/2017   HGB 13.8 10/29/2017   HCT 39.5 10/29/2017   PLT 173.0 10/29/2017   GLUCOSE 159 (H) 10/29/2017   CHOL 105 10/29/2017   TRIG 150.0 (H) 10/29/2017   HDL 33.90 (L) 10/29/2017   LDLCALC 41 10/29/2017   ALT 18 10/29/2017   AST 14 10/29/2017   NA 139 10/29/2017   K 3.7 10/29/2017   CL 99 10/29/2017   CREATININE 0.93 10/29/2017   BUN 12 10/29/2017   CO2 31 10/29/2017   TSH 1.53 11/29/2016   PSA 2.35 11/29/2016   INR 1.1 06/07/2016   HGBA1C 6.4 10/29/2017   MICROALBUR 4.4 (H) 11/29/2016       Assessment & Plan:   Problem List Items Addressed This Visit    Abnormal liver function tests    Recent liver panel wnl.  Follow liver function tests.        Relevant Orders   Hepatic function panel   Anemia    Has been evaluated by GI.  Follow cbc.        CAD (coronary artery disease)    S/p stent RCA.  On effient.  Followed by cardiology.  No chest pain.  Currently doing well.  Continue risk factor modification.        Diabetes mellitus ([place])    Low carb diet and exercise.  Follow met b and a1c.        Relevant  Orders   Hemoglobin W1U   Basic metabolic panel   Microalbumin / creatinine urine ratio   Dysphagia    S/p EGD 06/2017.  Still with persistent issues.  Discussed chewing food well, eating slowly and taking small bites.  Get him back in with GI for further evaluation.        Relevant Orders   Ambulatory referral to Gastroenterology   Hernia, inguinal    Some increased fullness. No pain.  Request referral to surgery for evaluation.        Relevant Orders   Ambulatory referral to General Surgery   Hyperlipidemia    On crestor.  Low cholesterol diet and exercise.  Follow lipid panel and liver function tests.        Relevant Orders   Lipid panel   Hypertension    Blood pressure under good control.  Continue same medication regimen.  Follow pressures.  Follow metabolic panel.        Relevant Orders   TSH   Psoriatic arthritis ([place])    Followed by Dr Jefm Bryant.  Stable.            Einar Pheasant, MD

## 2017-11-03 ENCOUNTER — Encounter: Payer: Self-pay | Admitting: Internal Medicine

## 2017-11-03 DIAGNOSIS — K4090 Unilateral inguinal hernia, without obstruction or gangrene, not specified as recurrent: Secondary | ICD-10-CM | POA: Insufficient documentation

## 2017-11-03 NOTE — Assessment & Plan Note (Signed)
Some increased fullness. No pain.  Request referral to surgery for evaluation.

## 2017-11-03 NOTE — Assessment & Plan Note (Signed)
Blood pressure under good control.  Continue same medication regimen.  Follow pressures.  Follow metabolic panel.   

## 2017-11-03 NOTE — Assessment & Plan Note (Signed)
S/p EGD 06/2017.  Still with persistent issues.  Discussed chewing food well, eating slowly and taking small bites.  Get him back in with GI for further evaluation.

## 2017-11-03 NOTE — Assessment & Plan Note (Signed)
Has been evaluated by GI.  Follow cbc.   

## 2017-11-03 NOTE — Assessment & Plan Note (Signed)
Followed by Dr [facility].  Stable.  

## 2017-11-03 NOTE — Assessment & Plan Note (Signed)
Recent liver panel wnl.  Follow liver function tests.

## 2017-11-03 NOTE — Assessment & Plan Note (Signed)
Low carb diet and exercise.  Follow met b and a1c.   

## 2017-11-03 NOTE — Assessment & Plan Note (Signed)
On crestor.  Low cholesterol diet and exercise.  Follow lipid panel and liver function tests.   

## 2017-11-03 NOTE — Assessment & Plan Note (Signed)
S/p stent RCA.  On effient.  Followed by cardiology.  No chest pain.  Currently doing well.  Continue risk factor modification.

## 2017-11-14 ENCOUNTER — Other Ambulatory Visit: Payer: Self-pay

## 2017-11-14 ENCOUNTER — Ambulatory Visit (INDEPENDENT_AMBULATORY_CARE_PROVIDER_SITE_OTHER): Payer: Medicare Other | Admitting: Surgery

## 2017-11-14 ENCOUNTER — Encounter: Payer: Self-pay | Admitting: Surgery

## 2017-11-14 VITALS — BP 156/74 | HR 74 | Temp 98.1°F | Ht 68.5 in | Wt 193.0 lb

## 2017-11-14 DIAGNOSIS — K4090 Unilateral inguinal hernia, without obstruction or gangrene, not specified as recurrent: Secondary | ICD-10-CM

## 2017-11-14 NOTE — Patient Instructions (Addendum)
Inguinal Hernia, Adult An inguinal hernia is when fat or the intestines push through the area where the leg meets the lower belly (groin) and make a rounded lump (bulge). This condition happens over time. There are three types of inguinal hernias. These types include:  Hernias that can be pushed back into the belly (are reducible).  Hernias that cannot be pushed back into the belly (are incarcerated).  Hernias that cannot be pushed back into the belly and lose their blood supply (get strangulated). This type needs emergency surgery.  Follow these instructions at home: Lifestyle  Drink enough fluid to keep your urine (pee) clear or pale yellow.  Eat plenty of fruits, vegetables, and whole grains. These have a lot of fiber. Talk with your doctor if you have questions.  Avoid lifting heavy objects.  Avoid standing for long periods of time.  Do not use tobacco products. These include cigarettes, chewing tobacco, or e-cigarettes. If you need help quitting, ask your doctor.  Try to stay at a healthy weight. General instructions  Do not try to force the hernia back in.  Watch your hernia for any changes in color or size. Let your doctor know if there are any changes.  Take over-the-counter and prescription medicines only as told by your doctor.  Keep all follow-up visits as told by your doctor. This is important. Contact a doctor if:  You have a fever.  You have new symptoms.  Your symptoms get worse. Get help right away if:  The area where the legs meets the lower belly has: ? Pain that gets worse suddenly. ? A bulge that gets bigger suddenly and does not go down. ? A bulge that turns red or purple. ? A bulge that is painful to the touch.  You are a man and your scrotum: ? Suddenly feels painful. ? Suddenly changes in size.  You feel sick to your stomach (nauseous) and this feeling does not go away.  You throw up (vomit) and this keeps happening.  You feel your heart  beating a lot more quickly than normal.  You cannot poop (have a bowel movement) or pass gas. This information is not intended to replace advice given to you by your health care provider. Make sure you discuss any questions you have with your health care provider. Document Released: 01/31/2006 Document Revised: 06/08/2015 Document Reviewed: 11/10/2013 Elsevier Interactive Patient Education  2018 [employer]  

## 2017-11-16 ENCOUNTER — Encounter: Payer: Self-pay | Admitting: Surgery

## 2017-11-16 NOTE — Progress Notes (Signed)
11/16/2017  Reason for Visit:  Left inguinal hernia  Referring Provider:  Einar Pheasant, MD  History of Present Illness: Shane Mcknight is a 67 y.o. male who presents for evaluation of a left inguinal hernia.  Patient reports he's had this for a year and a half and more recently has become more bothersome.  He denies any significant pain but reports sometimes having pressure and fullness at the groin area.  This does not extend to the scrotum.  Denies any obstruction symptoms, particularly denies any nausea, vomiting, abdominal pain, diarrhea, constipation, or blood in the stool.  He hernia is reducible and he's able to reduce it himself.   Of note he has a history of right inguinal hernia repair in the 1970s and denies any symptoms on the right.  Past Medical History: Past Medical History:  Diagnosis Date  . Abnormal liver function tests   . Allergic state   . Arthritis    spine, hands, shoulder with previous cuff tear  . CAD (coronary artery disease)    a.  11/2015 CT Chest/Abd: Coronray Ca2+ noted;  b. MV: EF 45-54%, med defect of mod severity in basal infsept, basal inf, mid infsept, and mid inf region-> infarct and peri-infarct ischemia-->intermediate risk; c. 05/2016 Cath/PCI: LM 40ost, LAD 30p, 11m, D1 60ost, LCX 60ost, OM1 99 (small->Med Rx), RCA 60p, 95d (2.5x15 Resolute Onyx DES), RPDA/RPLB min irregs, D2 60, EF 55-65%/  . Candidiasis of esophagus ([place])   . Colon polyp   . DDD (degenerative disc disease), lumbosacral   . Dysphagia   . Elevated transaminase level   . Esophageal stricture    a. s/p multiple dilations - last 03/2016.  . Ganglion cyst of finger of right hand   . GERD (gastroesophageal reflux disease)   . Guaiac positive stools   . Hiatal hernia   . HNP (herniated nucleus pulposus), lumbar   . Hypercholesterolemia   . Hypertension   . Lumbar radiculitis   . Mallory-Weiss tear    a. 11/2015.  . Migraine aura without headache    migraine, visual  . Psoriatic  arthritis ([place])   . Sleep apnea    does not uses CPAP  . Type II diabetes mellitus ([place])      Past Surgical History: Past Surgical History:  Procedure Laterality Date  . ARTHROPLASTY     right thumb; left thumb  . BALLOON DILATION N/A 05/29/2015   Procedure: BALLOON DILATION;  Surgeon: Lollie Sails, MD;  Location: [facility] ENDOSCOPY;  Service: Endoscopy;  Laterality: N/A;  . BALLOON DILATION N/A 02/14/2017   Procedure: BALLOON DILATION;  Surgeon: Lollie Sails, MD;  Location: [facility] ENDOSCOPY;  Service: Endoscopy;  Laterality: N/A;  . BILATERAL CARPAL TUNNEL RELEASE Bilateral 07/04/2017   Procedure: BILATERAL CARPAL TUNNEL RELEASE;  Surgeon: Leanor Kail, MD;  Location: [facility] ORS;  Service: Orthopedics;  Laterality: Bilateral;  . BLEPHAROPLASTY    . CARDIAC CATHETERIZATION     1 stent   . CARPAL TUNNEL RELEASE     right  . CHOLECYSTECTOMY    . COLONOSCOPY    . CORONARY STENT INTERVENTION N/A 06/14/2016   Procedure: Coronary Stent Intervention;  Surgeon: Wellington Hampshire, MD;  Location: [place] [facility];  Service: Cardiovascular;  Laterality: N/A;  . CYST EXCISION  04/14/2015   tendon sheath cyst excision; right ring finger cyst removed and trigger finger realease  . ESOPHAGOGASTRODUODENOSCOPY    . ESOPHAGOGASTRODUODENOSCOPY (EGD) WITH PROPOFOL N/A 05/29/2015   Procedure: ESOPHAGOGASTRODUODENOSCOPY (EGD) WITH PROPOFOL;  Surgeon: Lollie Sails, MD;  Location: [facility] ENDOSCOPY;  Service: Endoscopy;  Laterality: N/A;  . ESOPHAGOGASTRODUODENOSCOPY (EGD) WITH PROPOFOL N/A 07/04/2015   Procedure: ESOPHAGOGASTRODUODENOSCOPY (EGD) WITH PROPOFOL;  Surgeon: Lollie Sails, MD;  Location: [facility] ENDOSCOPY;  Service: Endoscopy;  Laterality: N/A;  . ESOPHAGOGASTRODUODENOSCOPY (EGD) WITH PROPOFOL N/A 01/04/2016   Procedure: ESOPHAGOGASTRODUODENOSCOPY (EGD) WITH PROPOFOL;  Surgeon: Lollie Sails, MD;  Location: [facility] ENDOSCOPY;  Service: Endoscopy;  Laterality: N/A;  .  ESOPHAGOGASTRODUODENOSCOPY (EGD) WITH PROPOFOL N/A 03/22/2016   Procedure: ESOPHAGOGASTRODUODENOSCOPY (EGD) WITH PROPOFOL;  Surgeon: Lollie Sails, MD;  Location: [facility] ENDOSCOPY;  Service: Endoscopy;  Laterality: N/A;  . ESOPHAGOGASTRODUODENOSCOPY (EGD) WITH PROPOFOL N/A 02/14/2017   Procedure: ESOPHAGOGASTRODUODENOSCOPY (EGD) WITH PROPOFOL;  Surgeon: Lollie Sails, MD;  Location: [facility] ENDOSCOPY;  Service: Endoscopy;  Laterality: N/A;  . ESOPHAGOGASTRODUODENOSCOPY (EGD) WITH PROPOFOL N/A 04/11/2017   Procedure: ESOPHAGOGASTRODUODENOSCOPY (EGD) WITH PROPOFOL;  Surgeon: Lollie Sails, MD;  Location: [facility] ENDOSCOPY;  Service: Endoscopy;  Laterality: N/A;  . ESOPHAGOGASTRODUODENOSCOPY (EGD) WITH PROPOFOL N/A 07/07/2017   Procedure: ESOPHAGOGASTRODUODENOSCOPY (EGD) WITH PROPOFOL;  Surgeon: Lucilla Lame, MD;  Location: [facility] ENDOSCOPY;  Service: Endoscopy;  Laterality: N/A;  . EYE SURGERY    . FOREIGN BODY REMOVAL N/A 12/03/2015   Procedure: FOREIGN BODY REMOVAL;  Surgeon: Wilford Corner, MD;  Location: [facility] ENDOSCOPY;  Service: Endoscopy;  Laterality: N/A;  . HERNIA REPAIR    . LEFT HEART CATH Left 06/14/2016   Procedure: Left Heart Cath;  Surgeon: Wellington Hampshire, MD;  Location: [place] [facility];  Service: Cardiovascular;  Laterality: Left;  . LUMBAR DISC SURGERY     injections; herniated disc; percutaneous discectomy  . NASAL SINUS SURGERY  2008  . percutaneous lumbar discectomy    . ROTATOR CUFF REPAIR Right   . TONSILLECTOMY      Home Medications: Prior to Admission medications   Medication Sig Start Date End Date Taking? Authorizing Provider  aspirin EC 81 MG tablet Take 81 mg by mouth daily.   Yes [provider]  atenolol (TENORMIN) 50 MG tablet TAKE 1 TABLET(50 MG) BY MOUTH TWICE DAILY 10/09/17  Yes Scott, Randell Patient, MD  COSENTYX SENSOREADY PEN 150 MG/ML SOAJ Inject 300 mg into the skin every 30 (thirty) days. Due 12/16/2015 04/06/15  Yes [provider]   cyanocobalamin (,VITAMIN B-12,) 1000 MCG/ML injection Inject 1 mL into the muscle once a week for the next 3 weeks and then once every 30 days. 08/08/17  Yes Einar Pheasant, MD  Fe Fum-FePoly-Vit C-Vit B3 (INTEGRA) 62.5-62.5-40-3 MG CAPS Take 1 capsule by mouth daily. 08/08/17  Yes Einar Pheasant, MD  FOLIC ACID PO Take 10 mg by mouth daily.    Yes [provider]  gabapentin (NEURONTIN) 300 MG capsule TAKE 1 CAPSULE(300 MG) BY MOUTH AT BEDTIME AS NEEDED FOR PAIN 10/09/17  Yes Einar Pheasant, MD  hydrochlorothiazide (HYDRODIURIL) 25 MG tablet Take 1 tablet (25 mg total) by mouth daily. 07/16/17  Yes Einar Pheasant, MD  losartan (COZAAR) 50 MG tablet TAKE 1 TABLET(50 MG) BY MOUTH DAILY. FOLLOW UP APPOINTMENT.THANKS 07/16/17  Yes Einar Pheasant, MD  metFORMIN (GLUCOPHAGE) 500 MG tablet TAKE 4 TABLETS BY MOUTH EVERY EVENING. 07/18/17  Yes Einar Pheasant, MD  Multiple Vitamin (MULTIVITAMIN) tablet Take 1 tablet by mouth daily.   Yes [provider]  nitroGLYCERIN (NITROSTAT) 0.4 MG SL tablet Place 1 tablet (0.4 mg total) under the tongue every 5 (five) minutes as needed. MAXIMUM OF 3 DOSES. 07/02/17  Yes Sharolyn Douglas,  Clance Boll, NP  pantoprazole (PROTONIX) 40 MG tablet Take 1 tablet (40 mg total) by mouth 2 (two) times daily. 07/18/17  Yes Einar Pheasant, MD  rosuvastatin (CRESTOR) 20 MG tablet TAKE 1 TABLET BY MOUTH EVERY DAY AT 6: 00 PM 08/26/17  Yes Theora Gianotti, NP  SYRINGE-NEEDLE, DISP, 3 ML 25G X 5/8" 3 ML MISC Use as instructed with B12 injection. 08/08/17  Yes Einar Pheasant, MD  vitamin E 100 UNIT capsule Take 400 Units by mouth daily.   Yes [provider]    Allergies: Allergies  Allergen Reactions  . Ephedrine Other (See Comments)    pain  . Levsin [Hyoscyamine Sulfate] Other (See Comments)    Urinary retention  . Naprosyn [Naproxen] Other (See Comments)    GI upset   . Sudafed [Pseudoephedrine Hcl] Other (See Comments)    Constricts prostate  flow  . Vicodin [Hydrocodone-Acetaminophen] Other (See Comments)    Other reaction(s): Other (See Comments) GI Upset  . Amoxicillin Itching and Rash  . Other Rash    Paper tape causes rash  . Penicillins Itching and Rash    Has patient had a PCN reaction causing immediate rash, facial/tongue/throat swelling, SOB or lightheadedness with hypotension: yes Has patient had a PCN reaction causing severe rash involving mucus membranes or skin necrosis: no Has patient had a PCN reaction that required hospitalization: no Has patient had a PCN reaction occurring within the last 10 years: no If all of the above answers are "NO", then may proceed with Cephalosporin use.   . Shellfish Allergy Rash  . Vancomycin Itching and Rash    Social History:  reports that he quit smoking about 35 years ago. His smoking use included cigarettes. He has never used smokeless tobacco. He reports that he drinks alcohol. He reports that he does not use drugs.   Family History: Family History  Problem Relation Age of Onset  . Lung cancer Father   . Cancer Father   . Hypertension Mother   . Aneurysm Son     Review of Systems: Review of Systems  Constitutional: Negative for chills and fever.  HENT: Negative for hearing loss.   Respiratory: Negative for shortness of breath.   Cardiovascular: Negative for chest pain.  Gastrointestinal: Negative for abdominal pain, diarrhea, nausea and vomiting.  Genitourinary: Negative for dysuria.  Musculoskeletal: Negative for myalgias.  Neurological: Negative for dizziness.  Psychiatric/Behavioral: Negative for depression.    Physical Exam BP (!) 156/74   Pulse 74   Temp 98.1 F (36.7 C) (Skin)   Ht 5' 8.5" (1.74 m)   Wt 193 lb (87.5 kg)   SpO2 98% Comment: room air  BMI 28.92 kg/m  CONSTITUTIONAL: No acute distress HEENT:  Normocephalic, atraumatic, extraocular motion intact. NECK: Trachea is midline, and there is no jugular venous distension.  RESPIRATORY:   Lungs are clear, and breath sounds are equal bilaterally. Normal respiratory effort without pathologic use of accessory muscles. CARDIOVASCULAR: Heart is regular without murmurs, gallops, or rubs. GI: The abdomen is soft, non-distended, non-tender to palpation.  Patient has a reducible left inguinal hernia, not particularly tender during reduction.  On the right side, there is no hernia recurrence.  MUSCULOSKELETAL:  Normal muscle strength and tone in all four extremities.  No peripheral edema or cyanosis. SKIN: Skin turgor is normal. There are no pathologic skin lesions.  NEUROLOGIC:  Motor and sensation is grossly normal.  Cranial nerves are grossly intact. PSYCH:  Alert and oriented to person, place and  time. Affect is normal.  Laboratory Analysis: Labs 10/29/17: Na 139, K 3.7, Cl 99, CO2 31, BUN 12, Cr 0.93.  LFTs normal.  WBC 6.5, Hgb 13.8, Hct 39.5, Plt 173.  Imaging: None  Assessment and Plan: This is a 67 y.o. male with a reducible left inguinal hernia.  --Discussed with the patient that his hernia is reducible and his symptoms are mild.  He is interested in hernia repair and we can certainly help.  Described the types of hernias ranging from his that is reducible to strangulated.  Given that his is reducible, there is no urgency to repair and in fact, he wants to wait until December for repair if possible.  This sounds very reasonable to [place].   --In the meantime, he will need clearance by cardiology given his prior history.  He had an appointment initially scheduled for 12/20, but we'll move it up to 11/21 so he can ben seen sooner for clearance for OR in late December as he requests.   --We'll also seen him early December so we can finalize OR date and discuss the surgery more in depth and update H&P.  Face-to-face time spent with the patient and care providers was 60 minutes, with more than 50% of the time spent counseling, educating, and coordinating care of the patient.     Melvyn Neth, [facility]

## 2017-11-30 NOTE — Progress Notes (Signed)
Cardiology Office Note Date:  12/04/2017  Patient ID:  Shane Mcknight, DOB [birth year], MRN [id number] PCP:  Einar Pheasant, MD  Cardiologist:  Dr. Fletcher Anon, MD    Chief Complaint: Preoperative cardiac evaluation   History of Present Illness: Shane Mcknight is a 67 y.o. male with history of CAD status post RCA stenting in 06/2016, HTN, HLD, DM2, sleep apnea, GERD, hiatal hernia, bilateral carpal tunnel syndrome s/p bilateral release in 06/2017, anemia, and esophageal stricture, Mallory-Weiss tear, Schatzki ring s/p prior EGD x 2 in 2019 who presents for preoperative cardiac evaluation for elective inguinal hernia repair.  Patient underwent stress testing in 05/2016 that was abnormal which was followed up by a LHC in 06/2016 that showed severe 2-vessel CAD involving the RCA and OM1 as outlined below. Cath details: ostial left main 40%, proximal LAD 30%, mid LAD 50%, D1 60%, ostial LCx 60%, OM1 99%, proximal RCA 60%, distal RCA 95%, 2nd RPLB 60%. He underwent successful PCI/DES to the distal RCA. The subtotal occlusion of the OM1 was medically managed given this was a small vessel of less than 2 mm in diameter and supplied a small area. There was no pressure dampening with engagement of the left main. Normal LVSF with normal LVEDP. He has previously held his Effient for carpal tunnel release this year without issues. This was not resumed following this surgery given he was doing well and it had been > 12 months since his PCI. He was last seen in the office in 06/2017 and was doing well from a cardiac perspective. He was seen by general surgery on 11/14/2017 for evaluation of a reducible left inguinal hernia that he wanted to get surgically repaired. He would like to have this done in 12/2017.   Labs: 10/2017 - A1c 6.4, LFT normal, LDL 41, K+ 3.7, SCr 0.93, HGB 13.8 (improvd from prior of 12)  He come sin doing well today. He denies any symptoms concerning for angina, SOB, palpitations, diaphoresis, dizziness,  presyncope, or syncope. No lower extremity swelling, abdominal distension, or orthopnea. He continues to live a very active lifestyle, building barn doors, working on fences, yard work, in addition to his regular job. He is not limited in these activities from a cardiac perspective. He is limited secondary to chronic right shoulder and low back pain. He is compliant with medications. No falls since he was last seen. He is able to achieve at least 31.45 METs per [facility] Activity Index.   Past Medical History:  Diagnosis Date  . Abnormal liver function tests   . Allergic state   . Arthritis    spine, hands, shoulder with previous cuff tear  . CAD (coronary artery disease)    a.  11/2015 CT Chest/Abd: Coronray Ca2+ noted;  b. MV: EF 45-54%, med defect of mod severity in basal infsept, basal inf, mid infsept, and mid inf region-> infarct and peri-infarct ischemia-->intermediate risk; c. 05/2016 Cath/PCI: LM 40ost, LAD 30p, 46m, D1 60ost, LCX 60ost, OM1 99 (small->Med Rx), RCA 60p, 95d (2.5x15 Resolute Onyx DES), RPDA/RPLB min irregs, D2 60, EF 55-65%/  . Candidiasis of esophagus ([place])   . Colon polyp   . DDD (degenerative disc disease), lumbosacral   . Dysphagia   . Elevated transaminase level   . Esophageal stricture    a. s/p multiple dilations - last 03/2016.  . Ganglion cyst of finger of right hand   . GERD (gastroesophageal reflux disease)   . Guaiac positive stools   . Hiatal hernia   .  HNP (herniated nucleus pulposus), lumbar   . Hypercholesterolemia   . Hypertension   . Lumbar radiculitis   . Mallory-Weiss tear    a. 11/2015.  . Migraine aura without headache    migraine, visual  . Psoriatic arthritis ([place])   . Sleep apnea    does not uses CPAP  . Type II diabetes mellitus ([place])     Past Surgical History:  Procedure Laterality Date  . ARTHROPLASTY     right thumb; left thumb  . BALLOON DILATION N/A 05/29/2015   Procedure: BALLOON DILATION;  Surgeon: Lollie Sails, MD;   Location: [facility] ENDOSCOPY;  Service: Endoscopy;  Laterality: N/A;  . BALLOON DILATION N/A 02/14/2017   Procedure: BALLOON DILATION;  Surgeon: Lollie Sails, MD;  Location: [facility] ENDOSCOPY;  Service: Endoscopy;  Laterality: N/A;  . BILATERAL CARPAL TUNNEL RELEASE Bilateral 07/04/2017   Procedure: BILATERAL CARPAL TUNNEL RELEASE;  Surgeon: Leanor Kail, MD;  Location: [facility] ORS;  Service: Orthopedics;  Laterality: Bilateral;  . BLEPHAROPLASTY    . CARDIAC CATHETERIZATION     1 stent   . CARPAL TUNNEL RELEASE     right  . CHOLECYSTECTOMY    . COLONOSCOPY    . CORONARY STENT INTERVENTION N/A 06/14/2016   Procedure: Coronary Stent Intervention;  Surgeon: Wellington Hampshire, MD;  Location: [facility] CV LAB;  Service: Cardiovascular;  Laterality: N/A;  . CYST EXCISION  04/14/2015   tendon sheath cyst excision; right ring finger cyst removed and trigger finger realease  . ESOPHAGOGASTRODUODENOSCOPY    . ESOPHAGOGASTRODUODENOSCOPY (EGD) WITH PROPOFOL N/A 05/29/2015   Procedure: ESOPHAGOGASTRODUODENOSCOPY (EGD) WITH PROPOFOL;  Surgeon: Lollie Sails, MD;  Location: [facility] ENDOSCOPY;  Service: Endoscopy;  Laterality: N/A;  . ESOPHAGOGASTRODUODENOSCOPY (EGD) WITH PROPOFOL N/A 07/04/2015   Procedure: ESOPHAGOGASTRODUODENOSCOPY (EGD) WITH PROPOFOL;  Surgeon: Lollie Sails, MD;  Location: [facility] ENDOSCOPY;  Service: Endoscopy;  Laterality: N/A;  . ESOPHAGOGASTRODUODENOSCOPY (EGD) WITH PROPOFOL N/A 01/04/2016   Procedure: ESOPHAGOGASTRODUODENOSCOPY (EGD) WITH PROPOFOL;  Surgeon: Lollie Sails, MD;  Location: [facility] ENDOSCOPY;  Service: Endoscopy;  Laterality: N/A;  . ESOPHAGOGASTRODUODENOSCOPY (EGD) WITH PROPOFOL N/A 03/22/2016   Procedure: ESOPHAGOGASTRODUODENOSCOPY (EGD) WITH PROPOFOL;  Surgeon: Lollie Sails, MD;  Location[facility];  Service: Endoscopy;  Laterality: N/A;  . ESOPHAGOGASTRODUODENOSCOPY (EGD) WITH PROPOFOL N/A 02/14/2017   Procedure: ESOPHAGOGASTRODUODENOSCOPY (EGD) WITH  PROPOFOL;  Surgeon: Lollie Sails, MD;  Location: [facility] ENDOSCOPY;  Service: Endoscopy;  Laterality: N/A;  . ESOPHAGOGASTRODUODENOSCOPY (EGD) WITH PROPOFOL N/A 04/11/2017   Procedure: ESOPHAGOGASTRODUODENOSCOPY (EGD) WITH PROPOFOL;  Surgeon: Lollie Sails, MD;  Location: [facility] ENDOSCOPY;  Service: Endoscopy;  Laterality: N/A;  . ESOPHAGOGASTRODUODENOSCOPY (EGD) WITH PROPOFOL N/A 07/07/2017   Procedure: ESOPHAGOGASTRODUODENOSCOPY (EGD) WITH PROPOFOL;  Surgeon: Lucilla Lame, MD;  Location: [facility] ENDOSCOPY;  Service: Endoscopy;  Laterality: N/A;  . EYE SURGERY    . FOREIGN BODY REMOVAL N/A 12/03/2015   Procedure: FOREIGN BODY REMOVAL;  Surgeon: Wilford Corner, MD;  Location: [facility] ENDOSCOPY;  Service: Endoscopy;  Laterality: N/A;  . HERNIA REPAIR    . LEFT HEART CATH Left 06/14/2016   Procedure: Left Heart Cath;  Surgeon: Wellington Hampshire, MD;  Location: [facility] CV LAB;  Service: Cardiovascular;  Laterality: Left;  . LUMBAR DISC SURGERY     injections; herniated disc; percutaneous discectomy  . NASAL SINUS SURGERY  2008  . percutaneous lumbar discectomy    . ROTATOR CUFF REPAIR Right   . TONSILLECTOMY      Current Meds  Medication Sig  .  aspirin EC 81 MG tablet Take 81 mg by mouth daily.  Marland Kitchen atenolol (TENORMIN) 50 MG tablet TAKE 1 TABLET(50 MG) BY MOUTH TWICE DAILY  . COSENTYX SENSOREADY PEN 150 MG/ML SOAJ Inject 300 mg into the skin every 30 (thirty) days. Due 12/16/2015  . cyanocobalamin (,VITAMIN B-12,) 1000 MCG/ML injection Inject 1 mL into the muscle once a week for the next 3 weeks and then once every 30 days.  . Fe Fum-FePoly-Vit C-Vit B3 (INTEGRA) 62.5-62.5-40-3 MG CAPS Take 1 capsule by mouth daily.  Marland Kitchen FOLIC ACID PO Take 10 mg by mouth daily.   Marland Kitchen gabapentin (NEURONTIN) 300 MG capsule TAKE 1 CAPSULE(300 MG) BY MOUTH AT BEDTIME AS NEEDED FOR PAIN  . hydrochlorothiazide (HYDRODIURIL) 25 MG tablet Take 1 tablet (25 mg total) by mouth daily.  Marland Kitchen losartan (COZAAR) 50 MG tablet  TAKE 1 TABLET(50 MG) BY MOUTH DAILY. FOLLOW UP APPOINTMENT.THANKS  . metFORMIN (GLUCOPHAGE) 500 MG tablet TAKE 4 TABLETS BY MOUTH EVERY EVENING. (Patient taking differently: Take 500 mg by mouth 2 (two) times daily with a meal. )  . Multiple Vitamin (MULTIVITAMIN) tablet Take 1 tablet by mouth daily.  . nitroGLYCERIN (NITROSTAT) 0.4 MG SL tablet Place 1 tablet (0.4 mg total) under the tongue every 5 (five) minutes as needed. MAXIMUM OF 3 DOSES.  Marland Kitchen pantoprazole (PROTONIX) 40 MG tablet Take 1 tablet (40 mg total) by mouth 2 (two) times daily.  . rosuvastatin (CRESTOR) 20 MG tablet TAKE 1 TABLET BY MOUTH EVERY DAY AT 6: 00 PM  . SYRINGE-NEEDLE, DISP, 3 ML 25G X 5/8" 3 ML MISC Use as instructed with B12 injection.  . vitamin E 100 UNIT capsule Take 400 Units by mouth daily.    Allergies:   Ephedrine; Levsin [hyoscyamine sulfate]; Naprosyn [naproxen]; Sudafed [pseudoephedrine hcl]; Vicodin [hydrocodone-acetaminophen]; Amoxicillin; Other; Penicillins; Shellfish allergy; and Vancomycin   Social History:  The patient  reports that he quit smoking about 35 years ago. His smoking use included cigarettes. He has never used smokeless tobacco. He reports that he drinks alcohol. He reports that he does not use drugs.   Family History:  [name] family history includes Aneurysm in his son; Cancer in his father; Hypertension in his mother; Lung cancer in his father.  ROS:   Review of Systems  Constitutional: Negative for chills, diaphoresis, fever, malaise/fatigue and weight loss.  HENT: Negative for congestion.   Eyes: Negative for discharge and redness.  Respiratory: Negative for cough, hemoptysis, sputum production, shortness of breath and wheezing.   Cardiovascular: Negative for chest pain, palpitations, orthopnea, claudication, leg swelling and PND.  Gastrointestinal: Negative for abdominal pain, blood in stool, heartburn, melena, nausea and vomiting.  Genitourinary: Negative for hematuria.    Musculoskeletal: Negative for falls and myalgias.  Skin: Negative for rash.  Neurological: Negative for dizziness, tingling, tremors, sensory change, speech change, focal weakness, loss of consciousness and weakness.  Endo/Heme/Allergies: Does not bruise/bleed easily.  Psychiatric/Behavioral: Negative for substance abuse. The patient is not nervous/anxious.   All other systems reviewed and are negative.    PHYSICAL EXAM:  VS:  BP 120/78 (BP Location: Left Arm, Patient Position: Sitting, Cuff Size: Large)   Pulse 62   Ht 5' 8.5" (1.74 m)   Wt 195 lb 8 oz (88.7 kg)   BMI 29.29 kg/m  BMI: Body mass index is 29.29 kg/m.  Physical Exam  Constitutional: He is oriented to person, place, and time. He appears well-developed and well-nourished.  HENT:  Head: Normocephalic and atraumatic.  Eyes:  Right eye exhibits no discharge. Left eye exhibits no discharge.  Neck: Normal range of motion. No JVD present.  Cardiovascular: Normal rate, regular rhythm, S1 normal, S2 normal and normal heart sounds. Exam reveals no distant heart sounds, no friction rub, no midsystolic click and no opening snap.  No murmur heard. Pulses:      Posterior tibial pulses are 2+ on the right side, and 2+ on the left side.  Pulmonary/Chest: Effort normal and breath sounds normal. No respiratory distress. He has no decreased breath sounds. He has no wheezes. He has no rales. He exhibits no tenderness.  Abdominal: Soft. He exhibits no distension. There is no tenderness.  Musculoskeletal: He exhibits no edema.  Neurological: He is alert and oriented to person, place, and time.  Skin: Skin is warm and dry. No cyanosis. Nails show no clubbing.  Psychiatric: He has a normal mood and affect. His speech is normal and behavior is normal. Judgment and thought content normal.     EKG:  Was ordered and interpreted by me today. Shows NSR, 62 bpm, left axis deviation, poor R wave progression along the precordial leads, no acute  st/t changes (unchanged from prior)  Recent Labs: 10/29/2017: ALT 18; BUN 12; Creatinine, Ser 0.93; Hemoglobin 13.8; Platelets 173.0; Potassium 3.7; Sodium 139  10/29/2017: Cholesterol 105; HDL 33.90; LDL Cholesterol 41; Total CHOL/HDL Ratio 3; Triglycerides 150.0; VLDL 30.0   CrCl cannot be calculated (Patient's most recent lab result is older than the maximum 21 days allowed.).   Wt Readings from Last 3 Encounters:  12/04/17 195 lb 8 oz (88.7 kg)  11/14/17 193 lb (87.5 kg)  10/31/17 192 lb 12.8 oz (87.5 kg)     Other studies reviewed: Additional studies/records reviewed today include: summarized above  ASSESSMENT AND PLAN:  1. Pre-operative cardiac evaluation: He continues to do well and has undergone several noncardiac procedure this year without issues. He is able to achieve at least 31.45 METs. His only limiting factors being orthopedic pain. His is low risk for a low risk surgery with an estimated risk of cardiac event being 0.9%. No further cardiac testing is needed prior to surgery as this will not reduce his risk any further. Recommend he continue ASA 81 mg daily throughout the perioperative period, in addition to Coreg and Crestor.  2. CAD involving the native coronary arteries without angina: No symptoms concerning for angina. He is > 12 months out from last PCI and has been off Effient since leading up to his carpal tunnel release in 06/2017 without issues. Continue ASA, Coreg, and Crestor. Aggressive secondary prevention. No plans for ischemic evaluation at this time.   3. HTN: Blood pressure is well controlled today. Continue Coreg, losartan, and HCTZ.   4. HLD: Most recent LDL from 10/2017 of 41 with normal LFT at that time. Continue Crestor.   5. Inguinal hernia: Continues to be easily reducible. Per surgery.   Disposition: F/u with Dr. Fletcher Anon or an APP in 6-12 months.   Current medicines are reviewed at length with the patient today.  The patient did not have any  concerns regarding medicines.  Signed, Christell Faith, PA-C 12/04/2017 3:11 PM     [address] [place], [place] [phone]

## 2017-12-01 ENCOUNTER — Encounter: Payer: Self-pay | Admitting: Internal Medicine

## 2017-12-02 ENCOUNTER — Other Ambulatory Visit: Payer: Self-pay

## 2017-12-02 MED ORDER — HYDROCHLOROTHIAZIDE 25 MG PO TABS: 25.0000 mg | ORAL_TABLET | Freq: Every day | ORAL | 1 refills | Status: DC

## 2017-12-04 ENCOUNTER — Ambulatory Visit: Payer: Medicare Other | Admitting: Physician Assistant

## 2017-12-04 ENCOUNTER — Other Ambulatory Visit: Payer: Self-pay | Admitting: Internal Medicine

## 2017-12-04 ENCOUNTER — Encounter: Payer: Self-pay | Admitting: Physician Assistant

## 2017-12-04 VITALS — BP 120/78 | HR 62 | Ht 68.5 in | Wt 195.5 lb

## 2017-12-04 DIAGNOSIS — E785 Hyperlipidemia, unspecified: Secondary | ICD-10-CM | POA: Diagnosis not present

## 2017-12-04 DIAGNOSIS — Z01810 Encounter for preprocedural cardiovascular examination: Secondary | ICD-10-CM | POA: Diagnosis not present

## 2017-12-04 DIAGNOSIS — I10 Essential (primary) hypertension: Secondary | ICD-10-CM | POA: Diagnosis not present

## 2017-12-04 DIAGNOSIS — K4090 Unilateral inguinal hernia, without obstruction or gangrene, not specified as recurrent: Secondary | ICD-10-CM

## 2017-12-04 DIAGNOSIS — I2510 Atherosclerotic heart disease of native coronary artery without angina pectoris: Secondary | ICD-10-CM

## 2017-12-04 NOTE — Patient Instructions (Signed)
Medication Instructions:  Your physician recommends that you continue on your current medications as directed. Please refer to the Current Medication list given to you today.  If you need a refill on your cardiac medications before your next appointment, please call your pharmacy.   Lab work: NONE If you have labs (blood work) drawn today and your tests are completely normal, you will receive your results only by: Marland Kitchen MyChart Message (if you have MyChart) OR . A paper copy in the mail If you have any lab test that is abnormal or we need to change your treatment, we will call you to review the results.  Testing/Procedures: NONE   Follow-Up: At [facility], you and your health needs are our priority.  As part of our continuing mission to provide you with exceptional heart care, we have created designated Provider Care Teams.  These Care Teams include your primary Cardiologist (physician) and Advanced Practice Providers (APPs -  Physician Assistants and Nurse Practitioners) who all work together to provide you with the care you need, when you need it. You will need a follow up appointment in 6-12  months.  Please call our office 2 months in advance to schedule this appointment.  You may see Kathlyn Sacramento, MD or one of the following Advanced Practice Providers on your designated Care Team:   Murray Hodgkins, NP Christell Faith, PA-C . Marrianne Mood, PA-C  Any Other Special Instructions Will Be Listed Below (If Applicable). Thank you for choosing [facility]!

## 2017-12-04 NOTE — Telephone Encounter (Signed)
Pt asking for a short supply of prescription to be sent to [facility] until he can reorder on 12/11/17 form [facility]. Pt states he will be out of town next week and with his current supply of medication he will be out of the medication by the weekend.Pt states he is currently taking 2-200 mg capsules every night due to arthritis pain. Pt informed that the current prescription on file is for Gabapentin 300 mg by mouth at bedtime as needed. Pt asked to return call to the office to clarify current prescription of Gabapentin. Pt verbalized understanding and states he will return call to the office once he gets off work and has current prescription bottle.

## 2017-12-04 NOTE — Telephone Encounter (Signed)
Pt returning call stating that his current prescription is for Gabapentin 300 mg and even though the prescription is written to take one capsule at bedtime as needed the pt states he has been taking 2 capsules at bedtime. Pt states he has 6 capsule of the current prescription left and since he has been taking 2 at bedtime he has run out of medication. Pt reports that he has been taking Gabapentin 5 out of 7 nights and has tried to skip some nights as much he can. Pt requesting a new prescription for Gabapentin 300 mg to take 2 capsules at bedtime as needed and would like for a 30 day supply sent to [facility] in [place] until he can receive the prescription via mail order with [facility].Pt reports that he is leaving to go out of town on next Wednesday and would like to have the medication before he leaves.

## 2017-12-04 NOTE — Addendum Note (Signed)
Addended by: Dimple Nanas on: 12/04/2017 06:25 PM   Modules accepted: Orders

## 2017-12-04 NOTE — Telephone Encounter (Signed)
TC to patient. Left VM with instructions on how to have his refill transferred to his local pharmacy. He is to call his local pharmacy and request they call his mail order pharmacy ([facility]) and have them transfer his gabapentin 300 MG, he has one refill on file ( I verified with [facility]) to his local pharmacy of choice.

## 2017-12-04 NOTE — Telephone Encounter (Signed)
Copied from [place] [id number]. Topic: Quick Communication - Rx Refill/Question >> Dec 04, 2017  8:16 AM Keene Breath wrote: Medication: gabapentin (NEURONTIN) 300 MG capsule  Patient called to request a refill for the above medication.  Patient stated he will run out by the end of the weekend and his going out of town.  Also would like it sent to the local pharmacy listed below.  Preferred Pharmacy (with phone number or street name): [facility] [place], [place] [phone] (Phone) [phone] (Fax)

## 2017-12-05 DIAGNOSIS — R1314 Dysphagia, pharyngoesophageal phase: Secondary | ICD-10-CM | POA: Diagnosis not present

## 2017-12-05 DIAGNOSIS — K210 Gastro-esophageal reflux disease with esophagitis: Secondary | ICD-10-CM | POA: Diagnosis not present

## 2017-12-05 DIAGNOSIS — Z8719 Personal history of other diseases of the digestive system: Secondary | ICD-10-CM | POA: Diagnosis not present

## 2017-12-05 DIAGNOSIS — Z86010 Personal history of colonic polyps: Secondary | ICD-10-CM | POA: Diagnosis not present

## 2017-12-05 NOTE — Telephone Encounter (Signed)
Last OV yesterday 12/04/17. Ok to refill?

## 2017-12-06 MED ORDER — GABAPENTIN 300 MG PO CAPS: ORAL_CAPSULE | ORAL | 0 refills | Status: DC

## 2017-12-06 NOTE — Telephone Encounter (Signed)
I sent in rx for gabapentin #60 with no refills to [facility] as pt requested. Need to confirm with pt if he needs a new rx for gabapentin sent in to mail order with correct directions.  If so, ok to send in 90 day supply with one refill.  Will need to cancel previous rx.

## 2017-12-06 NOTE — Addendum Note (Signed)
Addended by: Alisa Graff on: 12/06/2017 11:18 AM   Modules accepted: Orders

## 2017-12-08 NOTE — Telephone Encounter (Signed)
Pt stated he got his refill from [facility] but would like it switched to [facility] next time

## 2017-12-08 NOTE — Telephone Encounter (Signed)
LMTCB

## 2017-12-09 ENCOUNTER — Other Ambulatory Visit: Payer: Self-pay | Admitting: Internal Medicine

## 2017-12-19 ENCOUNTER — Encounter: Payer: Self-pay | Admitting: Surgery

## 2017-12-19 ENCOUNTER — Other Ambulatory Visit: Payer: Self-pay

## 2017-12-19 ENCOUNTER — Ambulatory Visit (INDEPENDENT_AMBULATORY_CARE_PROVIDER_SITE_OTHER): Payer: Medicare Other | Admitting: Surgery

## 2017-12-19 VITALS — BP 147/93 | HR 68 | Temp 98.1°F | Ht 68.5 in | Wt 192.2 lb

## 2017-12-19 DIAGNOSIS — K4090 Unilateral inguinal hernia, without obstruction or gangrene, not specified as recurrent: Secondary | ICD-10-CM | POA: Diagnosis not present

## 2017-12-19 NOTE — H&P (View-Only) (Signed)
12/19/2017  History of Present Illness: Shane Mcknight is a 67 y.o. male presenting for discussion of left inguinal hernia repair.  He saw his cardiologist on 11/21 and was cleared for surgery without any further testing needed.  Patient reports that he remains active and has still the same symptoms with no worsening.  He is interested in surgery after 12/12.  Past Medical History: Past Medical History:  Diagnosis Date  . Abnormal liver function tests   . Allergic state   . Arthritis    spine, hands, shoulder with previous cuff tear  . CAD (coronary artery disease)    a.  11/2015 CT Chest/Abd: Coronray Ca2+ noted;  b. MV: EF 45-54%, med defect of mod severity in basal infsept, basal inf, mid infsept, and mid inf region-> infarct and peri-infarct ischemia-->intermediate risk; c. 05/2016 Cath/PCI: LM 40ost, LAD 30p, 19m, D1 60ost, LCX 60ost, OM1 99 (small->Med Rx), RCA 60p, 95d (2.5x15 Resolute Onyx DES), RPDA/RPLB min irregs, D2 60, EF 55-65%/  . Candidiasis of esophagus ([place])   . Colon polyp   . DDD (degenerative disc disease), lumbosacral   . Dysphagia   . Elevated transaminase level   . Esophageal stricture    a. s/p multiple dilations - last 03/2016.  . Ganglion cyst of finger of right hand   . GERD (gastroesophageal reflux disease)   . Guaiac positive stools   . Hiatal hernia   . HNP (herniated nucleus pulposus), lumbar   . Hypercholesterolemia   . Hypertension   . Lumbar radiculitis   . Mallory-Weiss tear    a. 11/2015.  . Migraine aura without headache    migraine, visual  . Psoriatic arthritis ([place])   . Sleep apnea    does not uses CPAP  . Type II diabetes mellitus ([place])      Past Surgical History: Past Surgical History:  Procedure Laterality Date  . ARTHROPLASTY     right thumb; left thumb  . BALLOON DILATION N/A 05/29/2015   Procedure: BALLOON DILATION;  Surgeon: Lollie Sails, MD;  Location: [facility] ENDOSCOPY;  Service: Endoscopy;  Laterality: N/A;  . BALLOON  DILATION N/A 02/14/2017   Procedure: BALLOON DILATION;  Surgeon: Lollie Sails, MD;  Location: [facility] ENDOSCOPY;  Service: Endoscopy;  Laterality: N/A;  . BILATERAL CARPAL TUNNEL RELEASE Bilateral 07/04/2017   Procedure: BILATERAL CARPAL TUNNEL RELEASE;  Surgeon: Leanor Kail, MD;  Location: [facility] ORS;  Service: Orthopedics;  Laterality: Bilateral;  . BLEPHAROPLASTY    . CARDIAC CATHETERIZATION     1 stent   . CARPAL TUNNEL RELEASE     right  . CHOLECYSTECTOMY    . COLONOSCOPY    . CORONARY STENT INTERVENTION N/A 06/14/2016   Procedure: Coronary Stent Intervention;  Surgeon: Wellington Hampshire, MD;  Location: [place] CV LAB;  Service: Cardiovascular;  Laterality: N/A;  . CYST EXCISION  04/14/2015   tendon sheath cyst excision; right ring finger cyst removed and trigger finger realease  . ESOPHAGOGASTRODUODENOSCOPY    . ESOPHAGOGASTRODUODENOSCOPY (EGD) WITH PROPOFOL N/A 05/29/2015   Procedure: ESOPHAGOGASTRODUODENOSCOPY (EGD) WITH PROPOFOL;  Surgeon: Lollie Sails, MD;  Location: [facility] ENDOSCOPY;  Service: Endoscopy;  Laterality: N/A;  . ESOPHAGOGASTRODUODENOSCOPY (EGD) WITH PROPOFOL N/A 07/04/2015   Procedure: ESOPHAGOGASTRODUODENOSCOPY (EGD) WITH PROPOFOL;  Surgeon: Lollie Sails, MD;  Location: [facility] ENDOSCOPY;  Service: Endoscopy;  Laterality: N/A;  . ESOPHAGOGASTRODUODENOSCOPY (EGD) WITH PROPOFOL N/A 01/04/2016   Procedure: ESOPHAGOGASTRODUODENOSCOPY (EGD) WITH PROPOFOL;  Surgeon: Lollie Sails, MD;  Location: [facility] ENDOSCOPY;  Service: Endoscopy;  Laterality: N/A;  . ESOPHAGOGASTRODUODENOSCOPY (EGD) WITH PROPOFOL N/A 03/22/2016   Procedure: ESOPHAGOGASTRODUODENOSCOPY (EGD) WITH PROPOFOL;  Surgeon: Lollie Sails, MD;  Location: [facility] ENDOSCOPY;  Service: Endoscopy;  Laterality: N/A;  . ESOPHAGOGASTRODUODENOSCOPY (EGD) WITH PROPOFOL N/A 02/14/2017   Procedure: ESOPHAGOGASTRODUODENOSCOPY (EGD) WITH PROPOFOL;  Surgeon: Lollie Sails, MD;  Location: [facility] ENDOSCOPY;  Service:  Endoscopy;  Laterality: N/A;  . ESOPHAGOGASTRODUODENOSCOPY (EGD) WITH PROPOFOL N/A 04/11/2017   Procedure: ESOPHAGOGASTRODUODENOSCOPY (EGD) WITH PROPOFOL;  Surgeon: Lollie Sails, MD;  Location: [facility] ENDOSCOPY;  Service: Endoscopy;  Laterality: N/A;  . ESOPHAGOGASTRODUODENOSCOPY (EGD) WITH PROPOFOL N/A 07/07/2017   Procedure: ESOPHAGOGASTRODUODENOSCOPY (EGD) WITH PROPOFOL;  Surgeon: Lucilla Lame, MD;  Location: [facility] ENDOSCOPY;  Service: Endoscopy;  Laterality: N/A;  . EYE SURGERY    . FOREIGN BODY REMOVAL N/A 12/03/2015   Procedure: FOREIGN BODY REMOVAL;  Surgeon: Wilford Corner, MD;  Location: [facility] ENDOSCOPY;  Service: Endoscopy;  Laterality: N/A;  . HERNIA REPAIR    . LEFT HEART CATH Left 06/14/2016   Procedure: Left Heart Cath;  Surgeon: Wellington Hampshire, MD;  Location: [place] CV LAB;  Service: Cardiovascular;  Laterality: Left;  . LUMBAR DISC SURGERY     injections; herniated disc; percutaneous discectomy  . NASAL SINUS SURGERY  2008  . percutaneous lumbar discectomy    . ROTATOR CUFF REPAIR Right   . TONSILLECTOMY      Home Medications: Prior to Admission medications   Medication Sig Start Date End Date Taking? Authorizing Provider  aspirin EC 81 MG tablet Take 81 mg by mouth daily.    [provider]  atenolol (TENORMIN) 50 MG tablet TAKE 1 TABLET(50 MG) BY MOUTH TWICE DAILY 10/09/17   Einar Pheasant, MD  COSENTYX SENSOREADY PEN 150 MG/ML SOAJ Inject 300 mg into the skin every 30 (thirty) days. Due 12/16/2015 04/06/15   [provider]  cyanocobalamin (,VITAMIN B-12,) 1000 MCG/ML injection Inject 1 mL into the muscle once a week for the next 3 weeks and then once every 30 days. 08/08/17   Einar Pheasant, MD  Fe Fum-FePoly-Vit C-Vit B3 (INTEGRA) 62.5-62.5-40-3 MG CAPS Take 1 capsule by mouth daily. 08/08/17   Einar Pheasant, MD  FOLIC ACID PO Take 10 mg by mouth daily.     [provider]  gabapentin (NEURONTIN) 300 MG capsule TAKE 2 CAPSULES BY  MOUTH AT BEDTIME AS NEEDED FOR PAIN 12/06/17   Einar Pheasant, MD  hydrochlorothiazide (HYDRODIURIL) 25 MG tablet Take 1 tablet (25 mg total) by mouth daily. 12/02/17   Einar Pheasant, MD  losartan (COZAAR) 50 MG tablet TAKE 1 TABLET(50 MG) BY MOUTH DAILY. FOLLOW UP APPOINTMENT.THANKS 07/16/17   Einar Pheasant, MD  metFORMIN (GLUCOPHAGE) 500 MG tablet TAKE 4 TABLETS BY MOUTH EVERY EVENING. Patient taking differently: Take 500 mg by mouth 2 (two) times daily with a meal.  07/18/17   Einar Pheasant, MD  Multiple Vitamin (MULTIVITAMIN) tablet Take 1 tablet by mouth daily.    [provider]  nitroGLYCERIN (NITROSTAT) 0.4 MG SL tablet Place 1 tablet (0.4 mg total) under the tongue every 5 (five) minutes as needed. MAXIMUM OF 3 DOSES. 07/02/17   Theora Gianotti, NP  pantoprazole (PROTONIX) 40 MG tablet TAKE 1 TABLET BY MOUTH  TWICE A DAY 12/09/17   Einar Pheasant, MD  rosuvastatin (CRESTOR) 20 MG tablet TAKE 1 TABLET BY MOUTH EVERY DAY AT 6: 00 PM 08/26/17   Theora Gianotti, NP  SYRINGE-NEEDLE, DISP, 3 ML 25G X 5/8" 3  ML MISC Use as instructed with B12 injection. 08/08/17   Einar Pheasant, MD  vitamin E 100 UNIT capsule Take 400 Units by mouth daily.    [provider]    Allergies: Allergies  Allergen Reactions  . Ephedrine Other (See Comments)    pain  . Levsin [Hyoscyamine Sulfate] Other (See Comments)    Urinary retention  . Naprosyn [Naproxen] Other (See Comments)    GI upset   . Sudafed [Pseudoephedrine Hcl] Other (See Comments)    Constricts prostate flow  . Vicodin [Hydrocodone-Acetaminophen] Other (See Comments)    Other reaction(s): Other (See Comments) GI Upset  . Amoxicillin Itching and Rash  . Other Rash    Paper tape causes rash  . Penicillins Itching and Rash    Has patient had a PCN reaction causing immediate rash, facial/tongue/throat swelling, SOB or lightheadedness with hypotension: yes Has patient had a PCN reaction causing severe  rash involving mucus membranes or skin necrosis: no Has patient had a PCN reaction that required hospitalization: no Has patient had a PCN reaction occurring within the last 10 years: no If all of the above answers are "NO", then may proceed with Cephalosporin use.   . Shellfish Allergy Rash  . Vancomycin Itching and Rash    Review of Systems: Review of Systems  Constitutional: Negative for chills and fever.  Respiratory: Negative for shortness of breath.   Cardiovascular: Negative for chest pain.  Gastrointestinal: Negative for abdominal pain, nausea and vomiting.    Physical Exam BP (!) 147/93   Pulse 68   Temp 98.1 F (36.7 C) (Temporal)   Ht 5' 8.5" (1.74 m)   Wt 192 lb 3.2 oz (87.2 kg)   SpO2 98%   BMI 28.80 kg/m  CONSTITUTIONAL: No acute distress HEENT:  Normocephalic, atraumatic, extraocular motion intact. RESPIRATORY:  Lungs are clear, and breath sounds are equal bilaterally. Normal respiratory effort without pathologic use of accessory muscles. CARDIOVASCULAR: Heart is regular without murmurs, gallops, or rubs. GI: The abdomen is soft, nondistended, nontender.  Patient has a reducible left inguinal hernia. NEUROLOGIC:  Motor and sensation is grossly normal.  Cranial nerves are grossly intact. PSYCH:  Alert and oriented to person, place and time. Affect is normal.   Assessment and Plan: This is a 67 y.o. male with a reducible left inguinal hernia.  -Discussed with the patient the role for an open left inguinal hernia repair with mesh.  This will be done on 01/01/2018.  Discussed with patient the risk of bleeding, infection, injury to surrounding structures.  Discussed with him that after surgery he would remain with no heavy lifting or pushing of anything more than 10 to 15 pounds for period of 4 weeks.  He understands this restriction.  He is willing to proceed with surgery. -We will discuss with the patient's cardiologist whether it would be okay for him to come off  his aspirin for 3 days preop and 2 days postop.  If they agree, we will send a message to the patient to stop his aspirin at the appropriate date.  Face-to-face time spent with the patient and care providers was 15 minutes, with more than 50% of the time spent counseling, educating, and coordinating care of the patient.     Melvyn Neth, [facility]

## 2017-12-19 NOTE — Patient Instructions (Addendum)
Patient needs to be scheduled for surgery with Dr.Piscoya for hernia repair on 01/01/18. Will inform patient with instructions on stopping aspirin.   Call the office with any questions or concerns.    Laparoscopic Inguinal Hernia Repair, Adult Laparoscopic inguinal hernia repair is a surgical procedure to repair a small weak spot in the groin muscles that allows fat or intestine from inside the abdomen to bulge out (inguinal hernia). This procedure may be planned, or it may be an emergency procedure. During the procedure, tissue that has bulged out is moved back into place, and the opening in the groin muscles is repaired. This is done through three small incisions in the abdomen. A thin tube with a light and camera on the end (laparoscope) will be used to help perform the procedure. Tell a health care provider about:  Any allergies you have.  All medicines you are taking, including vitamins, herbs, eye drops, creams, and over-the-counter medicines.  Any problems you or family members have had with anesthetic medicines.  Any blood disorders you have.  Any surgeries you have had.  Any medical conditions you have.  Whether you are pregnant or may be pregnant. What are the risks? Generally, this is a safe procedure. However, problems may occur, including:  Infection.  Bleeding.  Allergic reactions to medicines.  Damage to other structures or organs.  Long-term pain and swelling of the scrotum, in men.  Testicle damage in men.  Inability to completely empty the bladder (urinary retention).  A collection of fluid that builds up under the skin (seroma).  The hernia coming back (recurrence).  What happens before the procedure? Medicines  Ask your health care provider about: ? Changing or stopping your regular medicines. This is especially important if you are taking diabetes medicines or blood thinners. ? Taking over-the-counter medicines, vitamins, herbs, and  supplements. ? Taking medicines such as aspirin and ibuprofen. These medicines can thin your blood. Do not take these medicines unless your health care provider tells you to take them.  You may be given antibiotic medicine to help prevent an infection. Staying hydrated Follow instructions from your health care provider about hydration, which may include:  Up to 2 hours before the procedure - you may continue to drink clear liquids, such as water, clear fruit juice, black coffee, and plain tea.  Eating and drinking restrictions Follow instructions from your health care provider about eating and drinking, which may include:  8 hours before the procedure - stop eating heavy meals or foods such as meat, fried foods, or fatty foods.  6 hours before the procedure - stop eating light meals or foods, such as toast or cereal.  6 hours before the procedure - stop drinking milk or drinks that contain milk.  2 hours before the procedure - stop drinking clear liquids.  General instructions  Do not use any products that contain nicotine or tobacco, such as cigarettes and e-cigarettes. If you need help quitting, ask your health care provider.  You may be asked to shower with a germ-killing soap.  Plan to have someone take you home from the hospital or clinic.  Plan to have a responsible adult care for you for at least 24 hours after you leave the hospital or clinic. This is important. What happens during the procedure?  To lower your risk of infection: ? Your health care team will wash or sanitize their hands. ? Hair may be removed from the surgical area. ? Your skin will be washed with  soap.  An IV will be inserted into one of your veins.  You will be given one or more of the following: ? A medicine to help you relax (sedative). ? A medicine to make you fall asleep (general anesthetic).  Three small incisions will be made in your abdomen.  Your abdomen will be inflated with carbon  dioxide gas to make the surgical area easier to see.  A laparoscope and surgical instruments will be inserted through the incisions. The laparoscope will send images of the inside of your abdomen to a monitor in the room.  Tissue that is bulging through the hernia may be removed or moved back into its normal place.  The hernia opening will be closed with a sheet of surgical mesh.  The surgical instruments and laparoscope will be removed.  Your incisions will be closed with stitches (sutures) and adhesive strips.  A bandage (dressing) will be placed over your incisions. The procedure may vary among health care providers and hospitals. What happens after the procedure?  Your blood pressure, heart rate, breathing rate, and blood oxygen level will be monitored until the medicines you were given have worn off.  You will be given pain medicine as needed.  You may continue to receive medicines and fluids through an IV. The IV will be removed after you can drink fluids well.  You will be encouraged to get up and move around, and to take deep breaths frequently.  Do not drive for 24 hours if you were given a sedative during your procedure. Summary  Laparoscopic inguinal hernia repair is a surgical procedure to repair a small weak spot in the groin muscles that allows fat or intestine from inside the abdomen to bulge out (inguinal hernia).  This procedure is done through three small incisions in the abdomen. A thin tube with a light and camera on the end (laparoscope) will be used to help perform the procedure.  After the procedure, you will be encouraged to get up and move around, and to take deep breaths frequently. This information is not intended to replace advice given to you by your health care provider. Make sure you discuss any questions you have with your health care provider. Document Released: 04/11/2016 Document Revised: 04/11/2016 Document Reviewed: 04/11/2016 Elsevier  Interactive Patient Education  2018 [place].   Inguinal Hernia, Adult An inguinal hernia is when fat or the intestines push through the area where the leg meets the lower belly (groin) and make a rounded lump (bulge). This condition happens over time. There are three types of inguinal hernias. These types include:  Hernias that can be pushed back into the belly (are reducible).  Hernias that cannot be pushed back into the belly (are incarcerated).  Hernias that cannot be pushed back into the belly and lose their blood supply (get strangulated). This type needs emergency surgery.  Follow these instructions at home: Lifestyle  Drink enough fluid to keep your urine (pee) clear or pale yellow.  Eat plenty of fruits, vegetables, and whole grains. These have a lot of fiber. Talk with your doctor if you have questions.  Avoid lifting heavy objects.  Avoid standing for long periods of time.  Do not use tobacco products. These include cigarettes, chewing tobacco, or e-cigarettes. If you need help quitting, ask your doctor.  Try to stay at a healthy weight. General instructions  Do not try to force the hernia back in.  Watch your hernia for any changes in color or size. Let  your doctor know if there are any changes.  Take over-the-counter and prescription medicines only as told by your doctor.  Keep all follow-up visits as told by your doctor. This is important. Contact a doctor if:  You have a fever.  You have new symptoms.  Your symptoms get worse. Get help right away if:  The area where the legs meets the lower belly has: ? Pain that gets worse suddenly. ? A bulge that gets bigger suddenly and does not go down. ? A bulge that turns red or purple. ? A bulge that is painful to the touch.  You are a man and your scrotum: ? Suddenly feels painful. ? Suddenly changes in size.  You feel sick to your stomach (nauseous) and this feeling does not go away.  You throw up  (vomit) and this keeps happening.  You feel your heart beating a lot more quickly than normal.  You cannot poop (have a bowel movement) or pass gas. This information is not intended to replace advice given to you by your health care provider. Make sure you discuss any questions you have with your health care provider. Document Released: 01/31/2006 Document Revised: 06/08/2015 Document Reviewed: 11/10/2013 Elsevier Interactive Patient Education  [employer].  The patient is scheduled for surgery at [facility] with Dr Hampton Abbot on 01/01/18. He will pre admit at the hospital and we will call him with that time and date.

## 2017-12-19 NOTE — Progress Notes (Signed)
12/19/2017  History of Present Illness: Shane Mcknight is a 67 y.o. male presenting for discussion of left inguinal hernia repair.  He saw his cardiologist on 11/21 and was cleared for surgery without any further testing needed.  Patient reports that he remains active and has still the same symptoms with no worsening.  He is interested in surgery after 12/12.  Past Medical History: Past Medical History:  Diagnosis Date  . Abnormal liver function tests   . Allergic state   . Arthritis    spine, hands, shoulder with previous cuff tear  . CAD (coronary artery disease)    a.  11/2015 CT Chest/Abd: Coronray Ca2+ noted;  b. MV: EF 45-54%, med defect of mod severity in basal infsept, basal inf, mid infsept, and mid inf region-> infarct and peri-infarct ischemia-->intermediate risk; c. 05/2016 Cath/PCI: LM 40ost, LAD 30p, 45m, D1 60ost, LCX 60ost, OM1 99 (small->Med Rx), RCA 60p, 95d (2.5x15 Resolute Onyx DES), RPDA/RPLB min irregs, D2 60, EF 55-65%/  . Candidiasis of esophagus ([place])   . Colon polyp   . DDD (degenerative disc disease), lumbosacral   . Dysphagia   . Elevated transaminase level   . Esophageal stricture    a. s/p multiple dilations - last 03/2016.  . Ganglion cyst of finger of right hand   . GERD (gastroesophageal reflux disease)   . Guaiac positive stools   . Hiatal hernia   . HNP (herniated nucleus pulposus), lumbar   . Hypercholesterolemia   . Hypertension   . Lumbar radiculitis   . Mallory-Weiss tear    a. 11/2015.  . Migraine aura without headache    migraine, visual  . Psoriatic arthritis ([place])   . Sleep apnea    does not uses CPAP  . Type II diabetes mellitus ([place])      Past Surgical History: Past Surgical History:  Procedure Laterality Date  . ARTHROPLASTY     right thumb; left thumb  . BALLOON DILATION N/A 05/29/2015   Procedure: BALLOON DILATION;  Surgeon: Lollie Sails, MD;  Location: [facility] ENDOSCOPY;  Service: Endoscopy;  Laterality: N/A;  . BALLOON  DILATION N/A 02/14/2017   Procedure: BALLOON DILATION;  Surgeon: Lollie Sails, MD;  Location: [facility] ENDOSCOPY;  Service: Endoscopy;  Laterality: N/A;  . BILATERAL CARPAL TUNNEL RELEASE Bilateral 07/04/2017   Procedure: BILATERAL CARPAL TUNNEL RELEASE;  Surgeon: Leanor Kail, MD;  Location: [facility] ORS;  Service: Orthopedics;  Laterality: Bilateral;  . BLEPHAROPLASTY    . CARDIAC CATHETERIZATION     1 stent   . CARPAL TUNNEL RELEASE     right  . CHOLECYSTECTOMY    . COLONOSCOPY    . CORONARY STENT INTERVENTION N/A 06/14/2016   Procedure: Coronary Stent Intervention;  Surgeon: Wellington Hampshire, MD;  Location: [place] CV LAB;  Service: Cardiovascular;  Laterality: N/A;  . CYST EXCISION  04/14/2015   tendon sheath cyst excision; right ring finger cyst removed and trigger finger realease  . ESOPHAGOGASTRODUODENOSCOPY    . ESOPHAGOGASTRODUODENOSCOPY (EGD) WITH PROPOFOL N/A 05/29/2015   Procedure: ESOPHAGOGASTRODUODENOSCOPY (EGD) WITH PROPOFOL;  Surgeon: Lollie Sails, MD;  Location: [facility] ENDOSCOPY;  Service: Endoscopy;  Laterality: N/A;  . ESOPHAGOGASTRODUODENOSCOPY (EGD) WITH PROPOFOL N/A 07/04/2015   Procedure: ESOPHAGOGASTRODUODENOSCOPY (EGD) WITH PROPOFOL;  Surgeon: Lollie Sails, MD;  Location: [facility] ENDOSCOPY;  Service: Endoscopy;  Laterality: N/A;  . ESOPHAGOGASTRODUODENOSCOPY (EGD) WITH PROPOFOL N/A 01/04/2016   Procedure: ESOPHAGOGASTRODUODENOSCOPY (EGD) WITH PROPOFOL;  Surgeon: Lollie Sails, MD;  Location: [facility] ENDOSCOPY;  Service: Endoscopy;  Laterality: N/A;  . ESOPHAGOGASTRODUODENOSCOPY (EGD) WITH PROPOFOL N/A 03/22/2016   Procedure: ESOPHAGOGASTRODUODENOSCOPY (EGD) WITH PROPOFOL;  Surgeon: Lollie Sails, MD;  Location: [facility] ENDOSCOPY;  Service: Endoscopy;  Laterality: N/A;  . ESOPHAGOGASTRODUODENOSCOPY (EGD) WITH PROPOFOL N/A 02/14/2017   Procedure: ESOPHAGOGASTRODUODENOSCOPY (EGD) WITH PROPOFOL;  Surgeon: Lollie Sails, MD;  Location: [facility] ENDOSCOPY;  Service:  Endoscopy;  Laterality: N/A;  . ESOPHAGOGASTRODUODENOSCOPY (EGD) WITH PROPOFOL N/A 04/11/2017   Procedure: ESOPHAGOGASTRODUODENOSCOPY (EGD) WITH PROPOFOL;  Surgeon: Lollie Sails, MD;  Location[facility] ENDOSCOPY;  Service: Endoscopy;  Laterality: N/A;  . ESOPHAGOGASTRODUODENOSCOPY (EGD) WITH PROPOFOL N/A 07/07/2017   Procedure: ESOPHAGOGASTRODUODENOSCOPY (EGD) WITH PROPOFOL;  Surgeon: Lucilla Lame, MD;  Location: [facility] ENDOSCOPY;  Service: Endoscopy;  Laterality: N/A;  . EYE SURGERY    . FOREIGN BODY REMOVAL N/A 12/03/2015   Procedure: FOREIGN BODY REMOVAL;  Surgeon: Wilford Corner, MD;  Location: [facility] ENDOSCOPY;  Service: Endoscopy;  Laterality: N/A;  . HERNIA REPAIR    . LEFT HEART CATH Left 06/14/2016   Procedure: Left Heart Cath;  Surgeon: Wellington Hampshire, MD;  Location: [place] CV LAB;  Service: Cardiovascular;  Laterality: Left;  . LUMBAR DISC SURGERY     injections; herniated disc; percutaneous discectomy  . NASAL SINUS SURGERY  2008  . percutaneous lumbar discectomy    . ROTATOR CUFF REPAIR Right   . TONSILLECTOMY      Home Medications: Prior to Admission medications   Medication Sig Start Date End Date Taking? Authorizing Provider  aspirin EC 81 MG tablet Take 81 mg by mouth daily.    [provider]  atenolol (TENORMIN) 50 MG tablet TAKE 1 TABLET(50 MG) BY MOUTH TWICE DAILY 10/09/17   Einar Pheasant, MD  COSENTYX SENSOREADY PEN 150 MG/ML SOAJ Inject 300 mg into the skin every 30 (thirty) days. Due 12/16/2015 04/06/15   [provider]  cyanocobalamin (,VITAMIN B-12,) 1000 MCG/ML injection Inject 1 mL into the muscle once a week for the next 3 weeks and then once every 30 days. 08/08/17   Einar Pheasant, MD  Fe Fum-FePoly-Vit C-Vit B3 (INTEGRA) 62.5-62.5-40-3 MG CAPS Take 1 capsule by mouth daily. 08/08/17   Einar Pheasant, MD  FOLIC ACID PO Take 10 mg by mouth daily.     [provider]  gabapentin (NEURONTIN) 300 MG capsule TAKE 2 CAPSULES BY  MOUTH AT BEDTIME AS NEEDED FOR PAIN 12/06/17   Einar Pheasant, MD  hydrochlorothiazide (HYDRODIURIL) 25 MG tablet Take 1 tablet (25 mg total) by mouth daily. 12/02/17   Einar Pheasant, MD  losartan (COZAAR) 50 MG tablet TAKE 1 TABLET(50 MG) BY MOUTH DAILY. FOLLOW UP APPOINTMENT.THANKS 07/16/17   Einar Pheasant, MD  metFORMIN (GLUCOPHAGE) 500 MG tablet TAKE 4 TABLETS BY MOUTH EVERY EVENING. Patient taking differently: Take 500 mg by mouth 2 (two) times daily with a meal.  07/18/17   Einar Pheasant, MD  Multiple Vitamin (MULTIVITAMIN) tablet Take 1 tablet by mouth daily.    [provider]  nitroGLYCERIN (NITROSTAT) 0.4 MG SL tablet Place 1 tablet (0.4 mg total) under the tongue every 5 (five) minutes as needed. MAXIMUM OF 3 DOSES. 07/02/17   Theora Gianotti, NP  pantoprazole (PROTONIX) 40 MG tablet TAKE 1 TABLET BY MOUTH  TWICE A DAY 12/09/17   Einar Pheasant, MD  rosuvastatin (CRESTOR) 20 MG tablet TAKE 1 TABLET BY MOUTH EVERY DAY AT 6: 00 PM 08/26/17   Theora Gianotti, NP  SYRINGE-NEEDLE, DISP, 3 ML 25G X 5/8" 3  ML MISC Use as instructed with B12 injection. 08/08/17   Einar Pheasant, MD  vitamin E 100 UNIT capsule Take 400 Units by mouth daily.    [provider]    Allergies: Allergies  Allergen Reactions  . Ephedrine Other (See Comments)    pain  . Levsin [Hyoscyamine Sulfate] Other (See Comments)    Urinary retention  . Naprosyn [Naproxen] Other (See Comments)    GI upset   . Sudafed [Pseudoephedrine Hcl] Other (See Comments)    Constricts prostate flow  . Vicodin [Hydrocodone-Acetaminophen] Other (See Comments)    Other reaction(s): Other (See Comments) GI Upset  . Amoxicillin Itching and Rash  . Other Rash    Paper tape causes rash  . Penicillins Itching and Rash    Has patient had a PCN reaction causing immediate rash, facial/tongue/throat swelling, SOB or lightheadedness with hypotension: yes Has patient had a PCN reaction causing severe  rash involving mucus membranes or skin necrosis: no Has patient had a PCN reaction that required hospitalization: no Has patient had a PCN reaction occurring within the last 10 years: no If all of the above answers are "NO", then may proceed with Cephalosporin use.   . Shellfish Allergy Rash  . Vancomycin Itching and Rash    Review of Systems: Review of Systems  Constitutional: Negative for chills and fever.  Respiratory: Negative for shortness of breath.   Cardiovascular: Negative for chest pain.  Gastrointestinal: Negative for abdominal pain, nausea and vomiting.    Physical Exam BP (!) 147/93   Pulse 68   Temp 98.1 F (36.7 C) (Temporal)   Ht 5' 8.5" (1.74 m)   Wt 192 lb 3.2 oz (87.2 kg)   SpO2 98%   BMI 28.80 kg/m  CONSTITUTIONAL: No acute distress HEENT:  Normocephalic, atraumatic, extraocular motion intact. RESPIRATORY:  Lungs are clear, and breath sounds are equal bilaterally. Normal respiratory effort without pathologic use of accessory muscles. CARDIOVASCULAR: Heart is regular without murmurs, gallops, or rubs. GI: The abdomen is soft, nondistended, nontender.  Patient has a reducible left inguinal hernia. NEUROLOGIC:  Motor and sensation is grossly normal.  Cranial nerves are grossly intact. PSYCH:  Alert and oriented to person, place and time. Affect is normal.   Assessment and Plan: This is a 67 y.o. male with a reducible left inguinal hernia.  -Discussed with the patient the role for an open left inguinal hernia repair with mesh.  This will be done on 01/01/2018.  Discussed with patient the risk of bleeding, infection, injury to surrounding structures.  Discussed with him that after surgery he would remain with no heavy lifting or pushing of anything more than 10 to 15 pounds for period of 4 weeks.  He understands this restriction.  He is willing to proceed with surgery. -We will discuss with the patient's cardiologist whether it would be okay for him to come off  his aspirin for 3 days preop and 2 days postop.  If they agree, we will send a message to the patient to stop his aspirin at the appropriate date.  Face-to-face time spent with the patient and care providers was 15 minutes, with more than 50% of the time spent counseling, educating, and coordinating care of the patient.     Melvyn Neth, [facility]

## 2017-12-19 NOTE — Addendum Note (Signed)
Addended by: Riki Sheer on: 12/19/2017 10:25 AM   Modules accepted: Orders, SmartSet

## 2017-12-23 ENCOUNTER — Telehealth: Payer: Self-pay

## 2017-12-23 NOTE — Telephone Encounter (Signed)
Patient notified that he will pre admit on 12/26/17 at 9:45 am.

## 2017-12-26 ENCOUNTER — Other Ambulatory Visit: Payer: Self-pay

## 2017-12-26 ENCOUNTER — Encounter
Admission: RE | Admit: 2017-12-26 | Discharge: 2017-12-26 | Disposition: A | Discharge status: Home / Self Care | Payer: Medicare Other | Source: Ambulatory Visit | Attending: Surgery | Admitting: Surgery

## 2017-12-26 DIAGNOSIS — Z01818 Encounter for other preprocedural examination: Secondary | ICD-10-CM | POA: Insufficient documentation

## 2017-12-26 HISTORY — DX: Anemia, unspecified: D64.9

## 2017-12-26 HISTORY — DX: Esophageal obstruction: K22.2

## 2017-12-26 NOTE — Patient Instructions (Signed)
Your procedure is scheduled on: Thursday, January 01, 2018  Report to [place]    DO NOT STOP ON THE FIRST FLOOR TO REGISTER  To find out your arrival time please call [phone] between 1PM - 3PM on Wednesday, December 31, 2017  Remember: Instructions that are not followed completely may result in serious medical risk,  up to and including death, or upon the discretion of your surgeon and anesthesiologist your  surgery may need to be rescheduled.     _X__ 1. Do not eat food after midnight the night before your procedure.                 No gum chewing or hard candies.                    ABSOLUTELY NOTHING SOLID IN YOUR MOUTH AFTER MIDNIGHT.                  You may drink clear liquids up to 2 hours before you are scheduled to arrive for your surgery-                   DO not drink clear liquids within 2 hours of the start of your surgery.                  Clear Liquids include:  water, apple juice without pulp, clear carbohydrate                 drink such as Clearfast of Gatorade, Black Coffee or Tea (Do not add                 anything to coffee or tea). NO JELLO OR BROTH OR DAIRY PRODUCTS IN YOUR COFFEE  __X__2.  On the morning of surgery brush your teeth with toothpaste and water,                   You may rinse your mouth with mouthwash if you wish.                        Do not swallow any toothpaste of mouthwash.     _X__ 3.  No Alcohol for 24 hours before or after surgery.   _X__ 4.  Do Not Smoke or use e-cigarettes For 24 Hours Prior to Your Surgery.                 Do not use any chewable tobacco products for at least 6 hours prior to                 surgery.  ____  5.  Bring all medications with you on the day of surgery if instructed.   ____  6.  Notify your doctor if there is any change in your medical condition      (cold, fever, infections).     Do not wear jewelry, make-up, hairpins, clips or nail  polish. Do not wear lotions, powders, or perfumes. You may wear deodorant. Do not shave 48 hours prior to surgery. Men may shave face and neck. Do not bring valuables to the hospital.    [facility] is not responsible for any belongings or valuables.  Contacts, dentures or bridgework may not be worn into surgery. Leave your suitcase in the car. After surgery it may be brought to your room. For patients admitted to the hospital, discharge time  is determined by your treatment team.   Patients discharged the day of surgery will not be allowed to drive home.   Please read over the following fact sheets that you were given:   PREPARING FOR SURGERY   _X__ Take these medicines the morning of surgery with A SIP OF WATER:    1. ATENOLOL  2. PROTONIX - MAKE SURE TO TAKE THE NIGHT BEFORE SURGERY ALSO  3. GABAPENTIN (TAKE ONLY 300 MG) PRIOR TO COMING TO HOSPITAL  4.  5.  6.  ____ Fleet Enema (as directed)   ___X_ Use CHG Soap as directed  ____ Use inhalers on the day of surgery  __X__ Stop metformin 2 days prior to surgery. LAST DOSE ON 12/29/17 AT [facility]  _X___ Stop ASPIRIN AS PER CARDIOLOGIST (DR. PISCOYA WOULD LIKE IT STOPPED                 3 DAYS PRIOR TO SURGERY AND STAY OFF OF IT FOR 2 DAYS AFTER SURGERY)  __X__ Stop Anti-inflammatories NOW   _X___ Stop supplements until after surgery.                STOP VITAMIN E NOW.  YOU MAY CONTINUE TAKING YOUR MULTIVITAMIN,                JUST DO NOT TAKE ON THE MORNING OF SURGERY. ____ Bring C-Pap to the hospital.   CONTINUE TAKING HYDROCHLOROTHIAZIDE AND LOSARTAN UP UNTIL THE DAY OF SURGERY.    DO NOT TAKE ON SURGERY MORNING.  CONTINUE TAKING YOUR IRON AND CRESTOR AT NIGHT.   [place].  HAVE STOOL SOFTENERS AVAILABLE FOR AFTER SURGERY.  SUGGEST YOU START TAKING      THEM A DAY OR TWO PRIOR TO SURGERY.  YOU DO NOT WANT TO STRAIN AFTER  SURGERY.

## 2017-12-26 NOTE — Pre-Procedure Instructions (Signed)
Per patient, cardiologist prefers that he NOT stop Aspirin prior to surgery but Dr. Hampton Abbot would like it stopped 3 days prior and 2 days post surgery. Patient to stop by cardiology office now to speak with them about whether to stop it or not.

## 2017-12-31 MED ORDER — CLINDAMYCIN PHOSPHATE 900 MG/50ML IV SOLN
900.0000 mg | INTRAVENOUS | Status: AC
  Administered 2018-01-01: 900 mg via INTRAVENOUS

## 2018-01-01 ENCOUNTER — Other Ambulatory Visit: Payer: Self-pay

## 2018-01-01 ENCOUNTER — Encounter: Payer: Self-pay | Admitting: Anesthesiology

## 2018-01-01 ENCOUNTER — Encounter
Admission: RE | Disposition: A | Discharge status: Home / Self Care | Payer: Self-pay | Source: Home / Self Care | Attending: Surgery

## 2018-01-01 ENCOUNTER — Ambulatory Visit: Payer: Medicare Other | Admitting: Anesthesiology

## 2018-01-01 ENCOUNTER — Ambulatory Visit
Admission: RE | Admit: 2018-01-01 | Discharge: 2018-01-01 | Disposition: A | Discharge status: Home / Self Care | Payer: Medicare Other | Attending: Surgery | Admitting: Surgery

## 2018-01-01 DIAGNOSIS — E7800 Pure hypercholesterolemia, unspecified: Secondary | ICD-10-CM | POA: Insufficient documentation

## 2018-01-01 DIAGNOSIS — Z87891 Personal history of nicotine dependence: Secondary | ICD-10-CM | POA: Insufficient documentation

## 2018-01-01 DIAGNOSIS — Z7982 Long term (current) use of aspirin: Secondary | ICD-10-CM | POA: Diagnosis not present

## 2018-01-01 DIAGNOSIS — G4730 Sleep apnea, unspecified: Secondary | ICD-10-CM | POA: Diagnosis not present

## 2018-01-01 DIAGNOSIS — D176 Benign lipomatous neoplasm of spermatic cord: Secondary | ICD-10-CM | POA: Diagnosis not present

## 2018-01-01 DIAGNOSIS — I10 Essential (primary) hypertension: Secondary | ICD-10-CM | POA: Diagnosis not present

## 2018-01-01 DIAGNOSIS — Z79899 Other long term (current) drug therapy: Secondary | ICD-10-CM | POA: Diagnosis not present

## 2018-01-01 DIAGNOSIS — K4090 Unilateral inguinal hernia, without obstruction or gangrene, not specified as recurrent: Secondary | ICD-10-CM | POA: Diagnosis not present

## 2018-01-01 DIAGNOSIS — Z7984 Long term (current) use of oral hypoglycemic drugs: Secondary | ICD-10-CM | POA: Insufficient documentation

## 2018-01-01 DIAGNOSIS — E119 Type 2 diabetes mellitus without complications: Secondary | ICD-10-CM | POA: Diagnosis not present

## 2018-01-01 DIAGNOSIS — K219 Gastro-esophageal reflux disease without esophagitis: Secondary | ICD-10-CM | POA: Diagnosis not present

## 2018-01-01 DIAGNOSIS — Z955 Presence of coronary angioplasty implant and graft: Secondary | ICD-10-CM | POA: Insufficient documentation

## 2018-01-01 DIAGNOSIS — I2510 Atherosclerotic heart disease of native coronary artery without angina pectoris: Secondary | ICD-10-CM | POA: Insufficient documentation

## 2018-01-01 HISTORY — PX: INGUINAL HERNIA REPAIR: SHX194

## 2018-01-01 LAB — GLUCOSE, CAPILLARY
Glucose-Capillary: 146 mg/dL — ABNORMAL HIGH (ref 70–99)
Glucose-Capillary: 128 mg/dL — ABNORMAL HIGH (ref 70–99)

## 2018-01-01 SURGERY — REPAIR, HERNIA, INGUINAL, ADULT
Anesthesia: General | Site: Abdomen | Laterality: Left

## 2018-01-01 MED ORDER — CHLORHEXIDINE GLUCONATE CLOTH 2 % EX PADS: 6.0000 | MEDICATED_PAD | Freq: Once | CUTANEOUS | Status: DC

## 2018-01-01 MED ORDER — BUPIVACAINE HCL (PF) 0.25 % IJ SOLN
INTRAMUSCULAR | Status: DC | PRN
  Administered 2018-01-01: 30 mL

## 2018-01-01 MED ORDER — FENTANYL CITRATE (PF) 100 MCG/2ML IJ SOLN
25.0000 ug | INTRAMUSCULAR | Status: DC | PRN
  Administered 2018-01-01: 25 ug via INTRAVENOUS

## 2018-01-01 MED ORDER — SODIUM CHLORIDE FLUSH 0.9 % IV SOLN
INTRAVENOUS | Status: AC
  Filled 2018-01-01: qty 10

## 2018-01-01 MED ORDER — FENTANYL CITRATE (PF) 100 MCG/2ML IJ SOLN
INTRAMUSCULAR | Status: AC
  Filled 2018-01-01: qty 2

## 2018-01-01 MED ORDER — FENTANYL CITRATE (PF) 100 MCG/2ML IJ SOLN
INTRAMUSCULAR | Status: DC | PRN
  Administered 2018-01-01 (×2): 50 ug via INTRAVENOUS

## 2018-01-01 MED ORDER — BUPIVACAINE LIPOSOME 1.3 % IJ SUSP: 20.0000 mL | Freq: Once | INTRAMUSCULAR | Status: DC

## 2018-01-01 MED ORDER — BUPIVACAINE LIPOSOME 1.3 % IJ SUSP
INTRAMUSCULAR | Status: AC
  Filled 2018-01-01: qty 20

## 2018-01-01 MED ORDER — CLINDAMYCIN PHOSPHATE 900 MG/50ML IV SOLN
INTRAVENOUS | Status: AC
  Filled 2018-01-01: qty 50

## 2018-01-01 MED ORDER — ACETAMINOPHEN 500 MG PO TABS
ORAL_TABLET | ORAL | Status: AC
  Filled 2018-01-01: qty 2

## 2018-01-01 MED ORDER — PROPOFOL 10 MG/ML IV BOLUS
INTRAVENOUS | Status: DC | PRN
  Administered 2018-01-01: 150 mg via INTRAVENOUS

## 2018-01-01 MED ORDER — GLYCOPYRROLATE 0.2 MG/ML IJ SOLN
INTRAMUSCULAR | Status: AC
  Filled 2018-01-01: qty 1

## 2018-01-01 MED ORDER — GLYCOPYRROLATE 0.2 MG/ML IJ SOLN
INTRAMUSCULAR | Status: DC | PRN
  Administered 2018-01-01: 0.2 mg via INTRAVENOUS

## 2018-01-01 MED ORDER — SODIUM CHLORIDE 0.9 % IV SOLN
INTRAVENOUS | Status: DC
  Administered 2018-01-01: 09:00:00 via INTRAVENOUS

## 2018-01-01 MED ORDER — OXYCODONE HCL 5 MG PO TABS: 5.0000 mg | ORAL_TABLET | ORAL | 0 refills | Status: DC | PRN

## 2018-01-01 MED ORDER — ACETAMINOPHEN 500 MG PO TABS
1000.0000 mg | ORAL_TABLET | ORAL | Status: AC
  Administered 2018-01-01: 1000 mg via ORAL

## 2018-01-01 MED ORDER — ONDANSETRON HCL 4 MG/2ML IJ SOLN
INTRAMUSCULAR | Status: AC
  Filled 2018-01-01: qty 2

## 2018-01-01 MED ORDER — EPHEDRINE SULFATE 50 MG/ML IJ SOLN
INTRAMUSCULAR | Status: DC | PRN
  Administered 2018-01-01 (×3): 10 mg via INTRAVENOUS

## 2018-01-01 MED ORDER — GABAPENTIN 300 MG PO CAPS: 300.0000 mg | ORAL_CAPSULE | ORAL | Status: DC

## 2018-01-01 MED ORDER — PROPOFOL 10 MG/ML IV BOLUS
INTRAVENOUS | Status: AC
  Filled 2018-01-01: qty 20

## 2018-01-01 MED ORDER — LIDOCAINE HCL (CARDIAC) PF 100 MG/5ML IV SOSY
PREFILLED_SYRINGE | INTRAVENOUS | Status: DC | PRN
  Administered 2018-01-01: 80 mg via INTRAVENOUS

## 2018-01-01 MED ORDER — SODIUM CHLORIDE 0.9 % IV SOLN
INTRAVENOUS | Status: DC | PRN
  Administered 2018-01-01: 30 mL

## 2018-01-01 MED ORDER — FENTANYL CITRATE (PF) 100 MCG/2ML IJ SOLN
INTRAMUSCULAR | Status: AC
  Administered 2018-01-01: 25 ug via INTRAVENOUS
  Filled 2018-01-01: qty 2

## 2018-01-01 MED ORDER — LIDOCAINE HCL (PF) 2 % IJ SOLN
INTRAMUSCULAR | Status: AC
  Filled 2018-01-01: qty 10

## 2018-01-01 MED ORDER — BUPIVACAINE HCL (PF) 0.25 % IJ SOLN
INTRAMUSCULAR | Status: AC
  Filled 2018-01-01: qty 30

## 2018-01-01 MED ORDER — ONDANSETRON HCL 4 MG/2ML IJ SOLN: 4.0000 mg | Freq: Once | INTRAMUSCULAR | Status: DC | PRN

## 2018-01-01 MED ORDER — ONDANSETRON HCL 4 MG/2ML IJ SOLN
INTRAMUSCULAR | Status: DC | PRN
  Administered 2018-01-01: 4 mg via INTRAVENOUS

## 2018-01-01 SURGICAL SUPPLY — 35 items
BLADE SURG 15 STRL LF DISP TIS (BLADE) ×1 IMPLANT
BLADE SURG 15 STRL SS (BLADE) ×1
CANISTER SUCT 1200ML W/VALVE (MISCELLANEOUS) ×2 IMPLANT
CHLORAPREP W/TINT 26ML (MISCELLANEOUS) ×2 IMPLANT
COVER WAND RF STERILE (DRAPES) ×2 IMPLANT
DERMABOND ADVANCED (GAUZE/BANDAGES/DRESSINGS) ×1
DERMABOND ADVANCED .7 DNX12 (GAUZE/BANDAGES/DRESSINGS) ×1 IMPLANT
DRAIN PENROSE 1/4X12 LTX (DRAIN) ×2 IMPLANT
DRAPE LAPAROTOMY 100X77 ABD (DRAPES) ×2 IMPLANT
ELECT CAUTERY BLADE 6.4 (BLADE) ×2 IMPLANT
ELECT REM PT RETURN 9FT ADLT (ELECTROSURGICAL) ×2
ELECTRODE REM PT RTRN 9FT ADLT (ELECTROSURGICAL) ×1 IMPLANT
GLOVE SURG SYN 7.0 (GLOVE) ×2 IMPLANT
GLOVE SURG SYN 7.5  E (GLOVE) ×1
GLOVE SURG SYN 7.5 E (GLOVE) ×1 IMPLANT
GOWN STRL REUS W/ TWL LRG LVL3 (GOWN DISPOSABLE) ×2 IMPLANT
GOWN STRL REUS W/TWL LRG LVL3 (GOWN DISPOSABLE) ×2
LABEL OR SOLS (LABEL) ×2 IMPLANT
MESH MARLEX PLUG MEDIUM (Mesh General) ×2 IMPLANT
NEEDLE HYPO 22GX1.5 SAFETY (NEEDLE) ×2 IMPLANT
NS IRRIG 500ML POUR BTL (IV SOLUTION) ×2 IMPLANT
PACK BASIN MINOR ARMC (MISCELLANEOUS) ×2 IMPLANT
SPONGE LAP 18X18 RF (DISPOSABLE) ×2 IMPLANT
SUT MNCRL 4-0 (SUTURE) ×1
SUT MNCRL 4-0 27XMFL (SUTURE) ×1
SUT PROLENE 2 0 SH DA (SUTURE) ×6 IMPLANT
SUT SILK 2 0 (SUTURE) ×1
SUT SILK 2-0 18XBRD TIE 12 (SUTURE) ×1 IMPLANT
SUT VIC AB 2-0 CT1 (SUTURE) ×4 IMPLANT
SUT VIC AB 3-0 SH 27 (SUTURE) ×1
SUT VIC AB 3-0 SH 27X BRD (SUTURE) ×1 IMPLANT
SUTURE MNCRL 4-0 27XMF (SUTURE) ×1 IMPLANT
SYR 10ML LL (SYRINGE) ×2 IMPLANT
SYR 30ML LL (SYRINGE) ×2 IMPLANT
SYR BULB IRRIG 60ML STRL (SYRINGE) ×2 IMPLANT

## 2018-01-01 NOTE — Interval H&P Note (Signed)
History and Physical Interval Note:  01/01/2018 9:47 AM  Shane Mcknight  has presented today for surgery, with the diagnosis of left inguinal hernia  The various methods of treatment have been discussed with the patient and family. After consideration of risks, benefits and other options for treatment, the patient has consented to  Procedure(s): OPEN HERNIA REPAIR INGUINAL ADULT (Left) as a surgical intervention .  [name] history has been reviewed, patient examined, no change in status, stable for surgery.  I have reviewed the patient's chart and labs.  Questions were answered to the patient's satisfaction.     Ridhaan Dreibelbis

## 2018-01-01 NOTE — Anesthesia Postprocedure Evaluation (Signed)
Anesthesia Post Note  Patient: Shane Mcknight  Procedure(s) Performed: OPEN HERNIA REPAIR INGUINAL ADULT (Left Abdomen)  Patient location during evaluation: PACU Anesthesia Type: General Level of consciousness: awake and alert Pain management: pain level controlled Vital Signs Assessment: post-procedure vital signs reviewed and stable Respiratory status: spontaneous breathing, nonlabored ventilation, respiratory function stable and patient connected to nasal cannula oxygen Cardiovascular status: blood pressure returned to baseline and stable Postop Assessment: no apparent nausea or vomiting Anesthetic complications: no     Last Vitals:  Vitals:   01/01/18 1345 01/01/18 1406  BP: (!) 142/86 (!) 149/82  Pulse: 70 67  Resp: 17 18  Temp: 36.8 C 36.7 C  SpO2: 99% 99%    Last Pain:  Vitals:   01/01/18 1406  TempSrc: Oral  PainSc: 2                  Tavi Hoogendoorn S

## 2018-01-01 NOTE — Transfer of Care (Signed)
Immediate Anesthesia Transfer of Care Note  Patient: Shane Mcknight  Procedure(s) Performed: OPEN HERNIA REPAIR INGUINAL ADULT (Left Abdomen)  Patient Location: PACU  Anesthesia Type:General  Level of Consciousness: drowsy  Airway & Oxygen Therapy: Patient Spontanous Breathing and Patient connected to face mask oxygen  Post-op Assessment: Report given to RN and Post -op Vital signs reviewed and stable  Post vital signs: Reviewed and stable  Last Vitals:  Vitals Value Taken Time  BP 145/83 01/01/2018  1:17 PM  Temp 37.1 C 01/01/2018  1:15 PM  Pulse 65 01/01/2018  1:17 PM  Resp 16 01/01/2018  1:17 PM  SpO2 100 % 01/01/2018  1:17 PM    Last Pain:  Vitals:   01/01/18 0814  TempSrc: Temporal  PainSc: 6          Complications: No apparent anesthesia complications

## 2018-01-01 NOTE — Op Note (Signed)
  Procedure Date:  01/01/2018  Pre-operative Diagnosis:  Left inguinal hernia  Post-operative Diagnosis:  Left inguinal hernia  Procedure:  Open Left Inguinal Hernia Repair with Mesh  Surgeon:  Melvyn Neth, MD  Anesthesia:  General endotracheal  Estimated Blood Loss:  15 ml  Specimens:  Left cord lipoma, left hernia sac  Complications:  None  Indications for Procedure:  This is a 67 y.o. male who presents with a left inguinal hernia.  The options of surgery versus observation were reviewed with the patient and/or family. The risks of bleeding, abscess or infection, recurrence of symptoms, potential for an open procedure, injury to surrounding structures, and chronic pain were all discussed with the patient and was willing to proceed.  Description of Procedure: The patient was correctly identified in the preoperative area and brought into the operating room.  The patient was placed supine with VTE prophylaxis in place.  Appropriate time-outs were performed.  Anesthesia was induced and the patient was intubated.  Appropriate antibiotics were infused.  The left groin and lower abdomen were prepped and draped in a sterile fashion. An oblique incision was made between the pubic symphysis extending laterally toward the ASIS. Using electrocautery, the subcutaneous tissues were dissected, assuring adequate hemostasis, until reaching the external oblique aponeurosis.  A 1 cm incision was made over the aponeurosis and extended laterally and medially toward the external inguinal ring avoiding injury to the ilioinguinal nerve.  The cord was encircled using a Penrose drain and using medial and lateral retraction, the cord structures were identified and the hernia sac was identified and dissected free.  A cord lipoma was encountered and dissected down to the base of the internal ring, ligated with 2-0 Silk tie and resected.  The stump was pushed back into the abdominal cavity.  A Bard plug was then  secured into the internal ring using 2-0 prolene, with careful attention not to injure the cord structures.  A Bard medium mesh was then placed and sutured to the pubic tubercle, shelving edge, and conjoined tendon with interrupted 2-0 Prolene sutures.  The tails of the mesh were crossed behind the cord and sutured together creating a new internal ring.  The external oblique was then closed in running fashion with 2-0 Vicryl, creating a new external ring.  The wound was irrigated and the incision was closed in three layers with running 2-0 Vicryl for scarpas fascia, interrupted 3-0 Vicryl for dermis, and running 4-0 Monocryl for skin.  The wound was cleaned and sealed with DermaBond.  The patient was emerged from anesthesia and extubated and brought to the recovery room for further management.  The patient tolerated the procedure well and all counts were correct at the end of the case.   Melvyn Neth, MD

## 2018-01-01 NOTE — Anesthesia Post-op Follow-up Note (Signed)
Anesthesia QCDR form completed.        

## 2018-01-01 NOTE — Anesthesia Preprocedure Evaluation (Addendum)
Anesthesia Evaluation  Patient identified by MRN, date of birth, ID band Patient awake    Reviewed: Allergy & Precautions, NPO status , Patient's Chart, lab work & pertinent test results, reviewed documented beta blocker date and time   Airway Mallampati: II  TM Distance: >3 FB     Dental  (+) Upper Dentures, Lower Dentures   Pulmonary sleep apnea , former smoker,           Cardiovascular hypertension, Pt. on medications and Pt. on home beta blockers + angina + CAD and + Cardiac Stents       Neuro/Psych  Headaches,  Neuromuscular disease    GI/Hepatic hiatal hernia, GERD  Controlled,  Endo/Other  diabetes, Type 2  Renal/GU      Musculoskeletal  (+) Arthritis ,   Abdominal   Peds  Hematology  (+) anemia ,   Anesthesia Other Findings Echo 45-55.  Reproductive/Obstetrics                            Anesthesia Physical Anesthesia Plan  ASA: III  Anesthesia Plan: General   Post-op Pain Management:    Induction: Intravenous  PONV Risk Score and Plan:   Airway Management Planned: LMA  Additional Equipment:   Intra-op Plan:   Post-operative Plan:   Informed Consent: I have reviewed the patients History and Physical, chart, labs and discussed the procedure including the risks, benefits and alternatives for the proposed anesthesia with the patient or authorized representative who has indicated his/her understanding and acceptance.     Plan Discussed with: CRNA  Anesthesia Plan Comments:         Anesthesia Quick Evaluation

## 2018-01-01 NOTE — Discharge Instructions (Addendum)
Open Hernia Repair, Adult, Care After These instructions give you information about caring for yourself after your procedure. Your doctor may also give you more specific instructions. If you have problems or questions, contact your doctor. Follow these instructions at home: Surgical cut (incision) care   Follow instructions from your doctor about how to take care of your surgical cut area. Make sure you: ? Wash your hands with soap and water before you change your bandage (dressing). If you cannot use soap and water, use hand sanitizer. ? Change your bandage as told by your doctor. ? Leave stitches (sutures), skin glue, or skin tape (adhesive) strips in place. They may need to stay in place for 2 weeks or longer. If tape strips get loose and curl up, you may trim the loose edges. Do not remove tape strips completely unless your doctor says it is okay.  Check your surgical cut every day for signs of infection. Check for: ? More redness, swelling, or pain. ? More fluid or blood. ? Warmth. ? Pus or a bad smell. Activity  Do not drive or use heavy machinery while taking prescription pain medicine. Do not drive until your doctor says it is okay.  Until your doctor says it is okay: ? Do not lift anything that is heavier than 10 lb (4.5 kg). ? Do not play contact sports.  Return to your normal activities as told by your doctor. Ask your doctor what activities are safe. General instructions  To prevent or treat having a hard time pooping (constipation) while you are taking prescription pain medicine, your doctor may recommend that you: ? Drink enough fluid to keep your pee (urine) clear or pale yellow. ? Take over-the-counter or prescription medicines. ? Eat foods that are high in fiber, such as fresh fruits and vegetables, whole grains, and beans. ? Limit foods that are high in fat and processed sugars, such as fried and sweet foods.  Take over-the-counter and prescription medicines only as  told by your doctor.  Do not take baths, swim, or use a hot tub until your doctor says it is okay.  Keep all follow-up visits as told by your doctor. This is important. Contact a doctor if:  You develop a rash.  You have more redness, swelling, or pain around your surgical cut.  You have more fluid or blood coming from your surgical cut.  Your surgical cut feels warm to the touch.  You have pus or a bad smell coming from your surgical cut.  You have a fever or chills.  You have blood in your poop (stool).  You have not pooped in 2-3 days.  Medicine does not help your pain. Get help right away if:  You have chest pain or you are short of breath.  You feel light-headed.  You feel weak and dizzy (feel faint).  You have very bad pain.  You throw up (vomit) and your pain is worse. This information is not intended to replace advice given to you by your health care provider. Make sure you discuss any questions you have with your health care provider. Document Released: 01/21/2014 Document Revised: 07/21/2015 Document Reviewed: 06/14/2015 Elsevier Interactive Patient Education  2019 [place]   1) The drugs that you were given will stay in your system until tomorrow so for the next 24 hours you should not:  A) Drive an automobile B) Make any legal decisions C) Drink any alcoholic beverage   2) You  may resume regular meals tomorrow.  Today it is better to start with liquids and gradually work up to solid foods.  You may eat anything you prefer, but it is better to start with liquids, then soup and crackers, and gradually work up to solid foods.   3) Please notify your doctor immediately if you have any unusual bleeding, trouble breathing, redness and pain at the surgery site, drainage, fever, or pain not relieved by medication.    4) Additional Instructions:        Please contact your physician with any  problems or Same Day Surgery at [phone], Monday through Friday 6 am to 4 pm, or [place] at [facility] number at [phone].

## 2018-01-02 ENCOUNTER — Encounter: Payer: Self-pay | Admitting: Surgery

## 2018-01-02 ENCOUNTER — Ambulatory Visit (INDEPENDENT_AMBULATORY_CARE_PROVIDER_SITE_OTHER): Payer: Medicare Other | Admitting: Surgery

## 2018-01-02 ENCOUNTER — Ambulatory Visit: Payer: Medicare Other | Admitting: Cardiovascular Disease

## 2018-01-02 ENCOUNTER — Other Ambulatory Visit: Payer: Self-pay

## 2018-01-02 VITALS — BP 132/90 | HR 105 | Temp 99.9°F | Resp 16 | Ht 68.5 in | Wt 187.8 lb

## 2018-01-02 DIAGNOSIS — Z09 Encounter for follow-up examination after completed treatment for conditions other than malignant neoplasm: Secondary | ICD-10-CM

## 2018-01-02 DIAGNOSIS — S301XXA Contusion of abdominal wall, initial encounter: Secondary | ICD-10-CM

## 2018-01-02 DIAGNOSIS — K4090 Unilateral inguinal hernia, without obstruction or gangrene, not specified as recurrent: Secondary | ICD-10-CM

## 2018-01-02 MED ORDER — CLINDAMYCIN HCL 300 MG PO CAPS: 300.0000 mg | ORAL_CAPSULE | Freq: Three times a day (TID) | ORAL | 0 refills | Status: DC

## 2018-01-02 NOTE — Progress Notes (Signed)
01/02/2018  HPI: Shane Mcknight is a 67 y.o. male s/p open left inguinal hernia repair on 12/19.  He developed a fever of 102.2 this morning and called to be seen.  He took Tylenol and his temperature now is 99.9.  He reports soreness at the incision and bruising at the groin and scrotum.  No troubles with urination.  Denies any coughing or sneezing or congestion.  Vital signs: BP 132/90   Pulse (!) 105   Temp 99.9 F (37.7 C) (Temporal)   Resp 16   Ht 5' 8.5" (1.74 m)   Wt 187 lb 12.8 oz (85.2 kg)   SpO2 100%   BMI 28.14 kg/m    Physical Exam: Constitutional: No acute distress Abdomen:  Soft, nondistended, with appropriate tenderness to palpation at the left groin incision.  Incision is clean, dry, intact without any erythema.  There is moderate ecchymosis of the left groin at the the incision as well as of the left scrotum.  There is swelling at the left groin likely from a hematoma given the amount of bruising.  Assessment/Plan: This is a 67 y.o. male s/p left inguinal hernia repair with mesh.  --Discussed with the patient that I do not think he's developed an infection and likely this is a hematoma as a result of surgery and his continued Aspirin.   --However, as a precaution, will start him on a prophylactic course of Clindamycin for 1 week course.  He is allergic to PCN and develops stomach upset and pain with Sulfa drugs. --Follow up in 1 week to check on incision and hematoma.   Melvyn Neth, [facility]

## 2018-01-02 NOTE — Patient Instructions (Addendum)
Patient to return as scheduled. Rx sent to pharmacy.   The patient is aware to call back for any questions or concerns.

## 2018-01-05 LAB — SURGICAL PATHOLOGY

## 2018-01-08 ENCOUNTER — Encounter: Payer: Self-pay | Admitting: Internal Medicine

## 2018-01-09 ENCOUNTER — Ambulatory Visit (INDEPENDENT_AMBULATORY_CARE_PROVIDER_SITE_OTHER): Payer: Medicare Other | Admitting: Surgery

## 2018-01-09 ENCOUNTER — Other Ambulatory Visit: Payer: Self-pay

## 2018-01-09 ENCOUNTER — Encounter: Payer: Self-pay | Admitting: Surgery

## 2018-01-09 VITALS — BP 117/76 | HR 76 | Temp 97.8°F | Resp 13 | Ht 68.0 in | Wt 185.0 lb

## 2018-01-09 DIAGNOSIS — K4090 Unilateral inguinal hernia, without obstruction or gangrene, not specified as recurrent: Secondary | ICD-10-CM

## 2018-01-09 DIAGNOSIS — S301XXD Contusion of abdominal wall, subsequent encounter: Secondary | ICD-10-CM

## 2018-01-09 DIAGNOSIS — Z09 Encounter for follow-up examination after completed treatment for conditions other than malignant neoplasm: Secondary | ICD-10-CM

## 2018-01-09 NOTE — Progress Notes (Signed)
01/09/2018  HPI: Shane Mcknight is a 67 y.o. male s/p left inguinal hernia repair on 12/19.  He was seen on 12/20 due to fevers and a hematoma on the left groin.  As a precaution he was started on clindamycin.  He denies any further fever and reports the last two days have been low grade.  He has not seen his PCP about his fevers and denies other symptoms.  He did not get a flu shot this year.  From groin standpoint, he reports soreness at the incision site going to the left scrotum, but this is stable.  He bruising is improving along the abdomen and is reabsorbing at the skin.  Vital signs: BP 117/76   Pulse 76   Temp 97.8 F (36.6 C) (Skin)   Resp 13   Ht 5\' 8"  (1.727 m)   Wt 185 lb (83.9 kg)   SpO2 98%   BMI 28.13 kg/m    Physical Exam: Constitutional: No acute distress Abdomen:  Soft, nondistended, appropriately tender to palpation.  Left groin incision is clean, dry, intact.  No evidence of infection.  There is a palpable firmness beneath the incision consistent with scar tissue as well as deeper consistent with hematoma.  The ecchymosis of the skin is improving.  There is mild swelling of the scrotum but no tenderness.  Ecchymosis of scrotum also improving.    Assessment/Plan: This is a 67 y.o. male s/p left inguinal hernia repair.  --Discussed with the patient that I still believe this is a post-op hematoma, compounded by the fact that he could not stop his aspirin prior to surgery.  This however is improving.  Reassured patient that I do not see any signs of infection and the incision is healthy. --Recommended that he use ice for the soreness at the groin level and he can also wear a jock strap to help with scrotal support. --will follow up in two weeks.   Melvyn Neth, [facility]

## 2018-01-09 NOTE — Patient Instructions (Signed)
Return in two weeks. The patient is aware to call back for any questions or concerns.  

## 2018-01-19 ENCOUNTER — Telehealth: Payer: Self-pay | Admitting: *Deleted

## 2018-01-19 NOTE — Telephone Encounter (Signed)
Patient states he is going to keep his appt with Dr. [place] Abbot on Friday, For him to check the area.

## 2018-01-19 NOTE — Telephone Encounter (Signed)
Patient called and stated that his Incision site feels like something is pulling and tearing when sitting down, he is also having some moderate pain with movement. Please call and  advise

## 2018-01-20 ENCOUNTER — Encounter: Payer: Medicare Other | Admitting: Surgery

## 2018-01-21 ENCOUNTER — Other Ambulatory Visit: Payer: Self-pay | Admitting: Internal Medicine

## 2018-01-23 ENCOUNTER — Other Ambulatory Visit: Payer: Self-pay

## 2018-01-23 ENCOUNTER — Encounter: Payer: Self-pay | Admitting: Surgery

## 2018-01-23 ENCOUNTER — Encounter: Payer: Medicare Other | Admitting: Surgery

## 2018-01-23 ENCOUNTER — Ambulatory Visit (INDEPENDENT_AMBULATORY_CARE_PROVIDER_SITE_OTHER): Payer: Medicare Other | Admitting: Surgery

## 2018-01-23 VITALS — BP 110/62 | HR 64 | Temp 98.0°F | Resp 14 | Ht 68.0 in | Wt 186.4 lb

## 2018-01-23 DIAGNOSIS — Z09 Encounter for follow-up examination after completed treatment for conditions other than malignant neoplasm: Secondary | ICD-10-CM

## 2018-01-23 DIAGNOSIS — K4090 Unilateral inguinal hernia, without obstruction or gangrene, not specified as recurrent: Secondary | ICD-10-CM

## 2018-01-23 DIAGNOSIS — R1032 Left lower quadrant pain: Secondary | ICD-10-CM

## 2018-01-23 MED ORDER — OXYCODONE HCL 5 MG PO TABS: 5.0000 mg | ORAL_TABLET | Freq: Four times a day (QID) | ORAL | 0 refills | Status: DC | PRN

## 2018-01-23 NOTE — Patient Instructions (Signed)
We have refilled you prescription for your oxycodone.  Your ultrasound is scheduled for 01/29/18 at 3:30 pm at [facility]. Please report to the first desk on the right.  You will arrive by 3:15 pm.

## 2018-01-23 NOTE — Progress Notes (Signed)
01/23/2018  HPI: Shane Mcknight is a 68 y.o. male s/p left inguinal hernia repair with mesh on 01/01/2018.  He presents today for follow-up.  He did have a complication of significant hematoma and bruising of his lower left and right abdominal [place].  This has improved and the swelling has improved as well, but he still has an area of tenderness in the left groin towards the distal portion of the incision which is also hard and swollen.  She does report that this causes him pain at times and is more difficult for him to mobilize due to it.  Vital signs: BP 110/62   Pulse 64   Temp 98 F (36.7 C)   Resp 14   Ht 5\' 8"  (1.727 m)   Wt 186 lb 6.4 oz (84.6 kg)   BMI 28.34 kg/m    Physical Exam: Constitutional: No acute distress Abdomen: Soft, nondistended, with tenderness to palpation at the distal portion of the incision of the left groin towards the scrotum.  There is an area of firmness that is non-mobile and does not change with Valsalva or coughing.  I do not see any evidence of a recurrent hernia at this point.  The incision itself is clean dry and intact with no erythema no further ecchymosis.  Assessment/Plan: This is a 68 y.o. male s/p left inguinal hernia repair with mesh.  Discussed with the patient that this could still be residual hematoma that has not absorbed yet versus scarring from his healing.  However due to the pain, we will order an ultrasound of the left groin to further investigate.  If this is just a hematoma that still resolving, we will call him with the results.  If this is nothing more, then we will have him follow-up with [place] in the clinic to discuss any potential plans.  In the meantime, we will refill his prescription for oxycodone so he can continue with pain control.   Melvyn Neth, [facility]

## 2018-01-29 ENCOUNTER — Ambulatory Visit
Admission: RE | Admit: 2018-01-29 | Discharge: 2018-01-29 | Disposition: A | Discharge status: Home / Self Care | Payer: Medicare Other | Source: Ambulatory Visit | Attending: Surgery | Admitting: Surgery

## 2018-01-29 DIAGNOSIS — R1030 Lower abdominal pain, unspecified: Secondary | ICD-10-CM | POA: Diagnosis not present

## 2018-01-29 DIAGNOSIS — R1032 Left lower quadrant pain: Secondary | ICD-10-CM | POA: Insufficient documentation

## 2018-02-13 ENCOUNTER — Encounter
Admission: RE | Disposition: A | Discharge status: Home / Self Care | Payer: Self-pay | Source: Home / Self Care | Attending: Gastroenterology

## 2018-02-13 ENCOUNTER — Ambulatory Visit: Payer: Medicare Other | Admitting: Anesthesiology

## 2018-02-13 ENCOUNTER — Ambulatory Visit
Admission: RE | Admit: 2018-02-13 | Discharge: 2018-02-13 | Disposition: A | Discharge status: Home / Self Care | Payer: Medicare Other | Attending: Gastroenterology | Admitting: Gastroenterology

## 2018-02-13 DIAGNOSIS — K219 Gastro-esophageal reflux disease without esophagitis: Secondary | ICD-10-CM | POA: Diagnosis not present

## 2018-02-13 DIAGNOSIS — N400 Enlarged prostate without lower urinary tract symptoms: Secondary | ICD-10-CM | POA: Diagnosis not present

## 2018-02-13 DIAGNOSIS — Z86010 Personal history of colonic polyps: Secondary | ICD-10-CM | POA: Insufficient documentation

## 2018-02-13 DIAGNOSIS — I10 Essential (primary) hypertension: Secondary | ICD-10-CM | POA: Diagnosis not present

## 2018-02-13 DIAGNOSIS — K449 Diaphragmatic hernia without obstruction or gangrene: Secondary | ICD-10-CM | POA: Diagnosis not present

## 2018-02-13 DIAGNOSIS — K224 Dyskinesia of esophagus: Secondary | ICD-10-CM | POA: Diagnosis not present

## 2018-02-13 DIAGNOSIS — E119 Type 2 diabetes mellitus without complications: Secondary | ICD-10-CM | POA: Insufficient documentation

## 2018-02-13 DIAGNOSIS — K2960 Other gastritis without bleeding: Secondary | ICD-10-CM | POA: Insufficient documentation

## 2018-02-13 DIAGNOSIS — G4730 Sleep apnea, unspecified: Secondary | ICD-10-CM | POA: Insufficient documentation

## 2018-02-13 DIAGNOSIS — G709 Myoneural disorder, unspecified: Secondary | ICD-10-CM | POA: Diagnosis not present

## 2018-02-13 DIAGNOSIS — Z955 Presence of coronary angioplasty implant and graft: Secondary | ICD-10-CM | POA: Diagnosis not present

## 2018-02-13 DIAGNOSIS — M1990 Unspecified osteoarthritis, unspecified site: Secondary | ICD-10-CM | POA: Insufficient documentation

## 2018-02-13 DIAGNOSIS — R1310 Dysphagia, unspecified: Secondary | ICD-10-CM | POA: Insufficient documentation

## 2018-02-13 DIAGNOSIS — D649 Anemia, unspecified: Secondary | ICD-10-CM | POA: Diagnosis not present

## 2018-02-13 DIAGNOSIS — I2510 Atherosclerotic heart disease of native coronary artery without angina pectoris: Secondary | ICD-10-CM | POA: Diagnosis not present

## 2018-02-13 DIAGNOSIS — K2970 Gastritis, unspecified, without bleeding: Secondary | ICD-10-CM | POA: Diagnosis not present

## 2018-02-13 DIAGNOSIS — K222 Esophageal obstruction: Secondary | ICD-10-CM | POA: Diagnosis not present

## 2018-02-13 DIAGNOSIS — K640 First degree hemorrhoids: Secondary | ICD-10-CM | POA: Diagnosis not present

## 2018-02-13 DIAGNOSIS — Z87891 Personal history of nicotine dependence: Secondary | ICD-10-CM | POA: Insufficient documentation

## 2018-02-13 DIAGNOSIS — K5730 Diverticulosis of large intestine without perforation or abscess without bleeding: Secondary | ICD-10-CM | POA: Insufficient documentation

## 2018-02-13 DIAGNOSIS — K5790 Diverticulosis of intestine, part unspecified, without perforation or abscess without bleeding: Secondary | ICD-10-CM | POA: Diagnosis not present

## 2018-02-13 DIAGNOSIS — K648 Other hemorrhoids: Secondary | ICD-10-CM | POA: Diagnosis not present

## 2018-02-13 HISTORY — DX: Other esophagitis without bleeding: K20.80

## 2018-02-13 HISTORY — PX: ESOPHAGOGASTRODUODENOSCOPY (EGD) WITH PROPOFOL: SHX5813

## 2018-02-13 HISTORY — DX: Other esophagitis: K20.8

## 2018-02-13 HISTORY — PX: COLONOSCOPY WITH PROPOFOL: SHX5780

## 2018-02-13 LAB — GLUCOSE, CAPILLARY: Glucose-Capillary: 127 mg/dL — ABNORMAL HIGH (ref 70–99)

## 2018-02-13 SURGERY — COLONOSCOPY WITH PROPOFOL
Anesthesia: General

## 2018-02-13 MED ORDER — SODIUM CHLORIDE 0.9 % IV SOLN
INTRAVENOUS | Status: DC
  Administered 2018-02-13: 07:00:00 via INTRAVENOUS
  Administered 2018-02-13: 1000 mL via INTRAVENOUS

## 2018-02-13 MED ORDER — PROPOFOL 500 MG/50ML IV EMUL
INTRAVENOUS | Status: AC
  Filled 2018-02-13: qty 50

## 2018-02-13 MED ORDER — PROPOFOL 10 MG/ML IV BOLUS
INTRAVENOUS | Status: DC | PRN
  Administered 2018-02-13 (×3): 50 mg via INTRAVENOUS
  Administered 2018-02-13: 100 mg via INTRAVENOUS

## 2018-02-13 MED ORDER — PROPOFOL 500 MG/50ML IV EMUL
INTRAVENOUS | Status: DC | PRN
  Administered 2018-02-13: 80 ug/kg/min via INTRAVENOUS

## 2018-02-13 MED ORDER — SODIUM CHLORIDE 0.9 % IV SOLN
INTRAVENOUS | Status: DC | PRN
  Administered 2018-02-13: 100 ug via INTRAVENOUS

## 2018-02-13 NOTE — Anesthesia Preprocedure Evaluation (Signed)
Anesthesia Evaluation  Patient identified by MRN, date of birth, ID band Patient awake    Reviewed: Allergy & Precautions, NPO status , Patient's Chart, lab work & pertinent test results, reviewed documented beta blocker date and time   Airway Mallampati: II  TM Distance: >3 FB     Dental  (+) Chipped   Pulmonary sleep apnea , former smoker,           Cardiovascular hypertension, Pt. on medications and Pt. on home beta blockers + angina + CAD and + Cardiac Stents       Neuro/Psych  Headaches,  Neuromuscular disease    GI/Hepatic hiatal hernia, GERD  ,  Endo/Other  diabetes, Type 2  Renal/GU      Musculoskeletal  (+) Arthritis ,   Abdominal   Peds  Hematology  (+) anemia ,   Anesthesia Other Findings EKG ok. EF 55-65.  Reproductive/Obstetrics                             Anesthesia Physical Anesthesia Plan  ASA: III  Anesthesia Plan: General   Post-op Pain Management:    Induction: Intravenous  PONV Risk Score and Plan:   Airway Management Planned:   Additional Equipment:   Intra-op Plan:   Post-operative Plan:   Informed Consent: I have reviewed the patients History and Physical, chart, labs and discussed the procedure including the risks, benefits and alternatives for the proposed anesthesia with the patient or authorized representative who has indicated his/her understanding and acceptance.       Plan Discussed with: CRNA  Anesthesia Plan Comments:         Anesthesia Quick Evaluation

## 2018-02-13 NOTE — Anesthesia Post-op Follow-up Note (Signed)
Anesthesia QCDR form completed.        

## 2018-02-13 NOTE — Op Note (Signed)
[facility] Gastroenterology Patient Name: Shane Mcknight Procedure Date: 02/13/2018 7:27 AM MRN: [id number] Account #: [id number] Date of Birth: [birth year] Admit Type: Outpatient Age: 68 Room: [facility] ENDO ROOM 3 Gender: Male Note Status: Finalized Procedure:            Colonoscopy Indications:          Personal history of colonic polyps Providers:            Lollie Sails, MD Complications:        No immediate complications. Procedure:            Pre-Anesthesia Assessment:                       - ASA Grade Assessment: III - A patient with severe                        systemic disease.                       After obtaining informed consent, the colonoscope was                        passed under direct vision. Throughout the procedure,                        the patient's blood pressure, pulse, and oxygen                        saturations were monitored continuously. The was                        introduced through the anus and advanced to the the                        cecum, identified by appendiceal orifice and ileocecal                        valve. The colonoscopy was performed without                        difficulty. The patient tolerated the procedure well.                        The quality of the bowel preparation was good. Findings:      A few small-mouthed diverticula were found in the sigmoid colon.      Non-bleeding internal hemorrhoids were found during anoscopy. The       hemorrhoids were small and Grade I (internal hemorrhoids that do not       prolapse).      The exam was otherwise without abnormality.      The digital rectal exam findings include enlarged prostate. Impression:           - Diverticulosis in the sigmoid colon.                       - Non-bleeding internal hemorrhoids.                       - The examination was otherwise normal.                       -  Enlarged prostate found on digital rectal exam.                       -  No specimens collected. Recommendation:       - Discharge patient to home. Procedure Code(s):    --- Professional ---                       [phone], Colonoscopy, flexible; diagnostic, including                        collection of specimen(s) by brushing or washing, when                        performed (separate procedure) Diagnosis Code(s):    --- Professional ---                       K64.0, First degree hemorrhoids                       Z86.010, Personal history of colonic polyps                       K57.30, Diverticulosis of large intestine without                        perforation or abscess without bleeding                       N40.0, Benign prostatic hyperplasia without lower                        urinary tract symptoms CPT copyright 2018 American Medical Association. All rights reserved. The codes documented in this report are preliminary and upon coder review may  be revised to meet current compliance requirements. Lollie Sails, MD 02/13/2018 8:23:08 AM This report has been signed electronically. Number of Addenda: 0 Note Initiated On: 02/13/2018 7:27 AM Scope Withdrawal Time: 0 hours 4 minutes 41 seconds  Total Procedure Duration: 0 hours 14 minutes 26 seconds       [facility]

## 2018-02-13 NOTE — Op Note (Signed)
[facility] Gastroenterology Patient Name: Shane Mcknight Procedure Date: 02/13/2018 7:27 AM MRN: [id number] Account #: [id number] Date of Birth: [birth year] Admit Type: Outpatient Age: 68 Room: [facility] ENDO ROOM 3 Gender: Male Note Status: Finalized Procedure:            Upper GI endoscopy Indications:          Dysphagia Providers:            Lollie Sails, MD, Manya Silvas, MD Medicines:            Monitored Anesthesia Care Complications:        No immediate complications. Procedure:            Pre-Anesthesia Assessment:                       - ASA Grade Assessment: III - A patient with severe                        systemic disease.                       After obtaining informed consent, the endoscope was                        passed under direct vision. Throughout the procedure,                        the patient's blood pressure, pulse, and oxygen                        saturations were monitored continuously. The Endoscope                        was introduced through the mouth, and advanced to the                        third part of duodenum. The upper GI endoscopy was                        accomplished without difficulty. The patient tolerated                        the procedure well. Findings:      A low-grade of narrowing Schatzki ring was found at the gastroesophageal       junction. The H 190 scope was passed with some difficulty into the       gastric vault.      Abnormal motility was noted in the middle third of the esophagus and in       the lower third of the esophagus. The cricopharyngeus was normal. There       is spasticity of the esophageal body. Tertiary peristaltic waves are       noted.      Patchy moderate inflammation characterized by congestion (edema) and       erythema was found in the gastric antrum.      A small to moderate hiatal hernia was present as seen in retroflex       examination.      The examined duodenum was  normal.      A guidewire was placed and the scope was withdrawn. Dilation was  performed at the gastroesophageal junction with a Savary dilator with       mild resistance at 38 Fr, 42 Fr, 45 Fr and 48 Fr. Impression:           - Low-grade of narrowing Schatzki ring.                       - Abnormal esophageal motility.                       - Erosive gastritis.                       - Small to moderate hiatal hernia.                       - Normal examined duodenum.                       - Dilation performed at the gastroesophageal junction.                       - No specimens collected. Recommendation:       - Clear liquid diet for 2 days.                       - Full liquid diet for 2 days, then advance as                        tolerated to soft diet for 2 days. Lollie Sails, MD 02/13/2018 8:01:28 AM This report has been signed electronically. Manya Silvas, MD Number of Addenda: 0 Note Initiated On: 02/13/2018 7:27 AM      [facility]

## 2018-02-13 NOTE — Anesthesia Postprocedure Evaluation (Signed)
Anesthesia Post Note  Patient: Duvid O Kindt  Procedure(s) Performed: COLONOSCOPY WITH PROPOFOL (N/A ) ESOPHAGOGASTRODUODENOSCOPY (EGD) WITH PROPOFOL (N/A )  Patient location during evaluation: Endoscopy Anesthesia Type: General Level of consciousness: awake and alert Pain management: pain level controlled Vital Signs Assessment: post-procedure vital signs reviewed and stable Respiratory status: spontaneous breathing, nonlabored ventilation, respiratory function stable and patient connected to nasal cannula oxygen Cardiovascular status: blood pressure returned to baseline and stable Postop Assessment: no apparent nausea or vomiting Anesthetic complications: no     Last Vitals:  Vitals:   02/13/18 0822 02/13/18 0824  BP: (!) 73/46 (!) 73/46  Pulse: 65 64  Resp: 17 20  Temp: 36.6 C   SpO2: 98% 97%    Last Pain:  Vitals:   02/13/18 0842  TempSrc:   PainSc: 0-No pain                 Eilis Chestnutt S

## 2018-02-13 NOTE — Transfer of Care (Signed)
Immediate Anesthesia Transfer of Care Note  Patient: Shane Mcknight  Procedure(s) Performed: COLONOSCOPY WITH PROPOFOL (N/A ) ESOPHAGOGASTRODUODENOSCOPY (EGD) WITH PROPOFOL (N/A )  Patient Location: PACU  Anesthesia Type:General  Level of Consciousness: awake  Airway & Oxygen Therapy: Patient Spontanous Breathing  Post-op Assessment: Report given to RN  Post vital signs: stable  Last Vitals:  Vitals Value Taken Time  BP 73/46 02/13/2018  8:23 AM  Temp 36.6 C 02/13/2018  8:22 AM  Pulse 64 02/13/2018  8:23 AM  Resp 17 02/13/2018  8:23 AM  SpO2 97 % 02/13/2018  8:23 AM  Vitals shown include unvalidated device data.  Last Pain:  Vitals:   02/13/18 0822  TempSrc: Tympanic  PainSc: Asleep         Complications: No apparent anesthesia complications

## 2018-02-13 NOTE — H&P (Signed)
Outpatient short stay form Pre-procedure 02/13/2018 7:27 AM Lollie Sails MD  Primary Physician: Einar Pheasant, MD  Reason for visit: EGD and colonoscopy  History of present illness: Patient is a 68 year old male presenting today for EGD and colonoscopy.  He has a well-known history of dysphagia and recurrent stenosis of the distal esophagus and has been dilated a number of times over the past several years.  His last EGD was urgently done this past June due to food being stuck and he was dilated to a 15 in size balloon at that time.  Continues to have some issues with dysphagia.  Was previously taking Effient however states he has been off of that for about a year.  He takes no blood thinner with the exception of 81 mg aspirin that was last taken yesterday.  Has no issues with rectal bleeding abdominal pain or diarrhea.    Current Facility-Administered Medications:  .  0.9 %  sodium chloride infusion, , Intravenous, Continuous, Lollie Sails, MD, Last Rate: 20 mL/hr at 02/13/18 0710, 1,000 mL at 02/13/18 0710  Medications Prior to Admission  Medication Sig Dispense Refill Last Dose  . acetaminophen (TYLENOL) 500 MG tablet Take 1,000 mg by mouth daily as needed for moderate pain.   Past Week at Unknown time  . aspirin EC 81 MG tablet Take 81 mg by mouth every evening.    02/12/2018 at Unknown time  . atenolol (TENORMIN) 50 MG tablet TAKE 1 TABLET(50 MG) BY MOUTH TWICE DAILY 180 tablet 1 02/13/2018 at 0500  . cyanocobalamin (,VITAMIN B-12,) 1000 MCG/ML injection Inject 1 mL into the muscle once a week for the next 3 weeks and then once every 30 days. (Patient taking differently: Inject 1,000 mcg into the muscle every 30 (thirty) days. ) 10 mL 3 Past Week at Unknown time  . Fe Fum-FePoly-Vit C-Vit B3 (INTEGRA) 62.5-62.5-40-3 MG CAPS Take 1 capsule by mouth daily. (Patient taking differently: Take 1 capsule by mouth every evening. ) 90 capsule 1 Past Week at Unknown time  . FOLIC ACID PO  Take 10 mcg by mouth every evening.    Past Week at Unknown time  . gabapentin (NEURONTIN) 300 MG capsule TAKE 2 CAPSULES BY MOUTH AT BEDTIME AS NEEDED FOR PAIN (Patient taking differently: Take 600 mg by mouth at bedtime as needed (pain). ) 60 capsule 0 Past Month at Unknown time  . hydrochlorothiazide (HYDRODIURIL) 25 MG tablet Take 1 tablet (25 mg total) by mouth daily. 90 tablet 1 02/13/2018 at 0500  . losartan (COZAAR) 50 MG tablet TAKE 1 TABLET BY MOUTH  DAILY 90 tablet 1 02/13/2018 at 0500  . metFORMIN (GLUCOPHAGE) 500 MG tablet Take 2 tablets (1,000 mg total) by mouth 2 (two) times daily with a meal. 360 tablet 1 02/12/2018 at Unknown time  . Multiple Vitamin (MULTIVITAMIN) tablet Take 1 tablet by mouth daily.   Past Week at Unknown time  . nitroGLYCERIN (NITROSTAT) 0.4 MG SL tablet Place 1 tablet (0.4 mg total) under the tongue every 5 (five) minutes as needed. MAXIMUM OF 3 DOSES. 25 tablet 6 Past Month at Unknown time  . pantoprazole (PROTONIX) 40 MG tablet TAKE 1 TABLET BY MOUTH  TWICE A DAY 180 tablet 1 02/12/2018 at Unknown time  . rosuvastatin (CRESTOR) 20 MG tablet TAKE 1 TABLET BY MOUTH EVERY DAY AT 6: 00 PM 90 tablet 2 02/12/2018 at Unknown time  . SYRINGE-NEEDLE, DISP, 3 ML 25G X 5/8" 3 ML MISC Use as instructed with B12  injection. 50 each 11 Past Week at Unknown time  . triamcinolone cream (KENALOG) 0.5 % Apply 1 application topically 2 (two) times daily as needed (scaling).   Past Month at Unknown time  . vitamin E 400 UNIT capsule Take 400 Units by mouth every evening.   Past Month at Unknown time  . clindamycin (CLEOCIN) 300 MG capsule Take 1 capsule (300 mg total) by mouth 3 (three) times daily. 21 capsule 0 Taking  . COSENTYX SENSOREADY PEN 150 MG/ML SOAJ Inject 300 mg into the skin every 30 (thirty) days.   10 Completed Course at Unknown time  . oxyCODONE (OXY IR/ROXICODONE) 5 MG immediate release tablet Take 1 tablet (5 mg total) by mouth every 6 (six) hours as needed for severe  pain. (Patient not taking: Reported on 02/13/2018) 30 tablet 0 Completed Course at Unknown time     Allergies  Allergen Reactions  . Ephedrine Other (See Comments)    Causing prostate to hurt. Using for sinus bronchodilators  . Nsaids Other (See Comments)    GI upset, history of esophagitis and Mallory-Weis tear  . Vancomycin Itching and Rash    Same as pcn with itching  . Amoxicillin Itching and Rash  . Levsin [Hyoscyamine Sulfate] Other (See Comments)    Urinary retention  . Naprosyn [Naproxen] Other (See Comments)    GI upset Also happens with other NSAIDS   . Other Rash    Paper tape causes rash Plastic tape is okay  . Penicillins Itching and Rash    Has patient had a PCN reaction causing immediate rash, facial/tongue/throat swelling, SOB or lightheadedness with hypotension: yes Has patient had a PCN reaction causing severe rash involving mucus membranes or skin necrosis: no Has patient had a PCN reaction that required hospitalization: no Has patient had a PCN reaction occurring within the last 10 years: no If all of the above answers are "NO", then may proceed with Cephalosporin use.   . Shellfish Allergy Rash    Ingested shellfish cCuses rash and itching along with stomach sickness. Pt tolerates betadine   . Sudafed [Pseudoephedrine Hcl] Other (See Comments)    Constricts prostate flow (from ephedrine d/t sinus meds)     Past Medical History:  Diagnosis Date  . Abnormal liver function tests   . Allergic state   . Anemia   . Arthritis    spine, hands, shoulder with previous cuff tear  . CAD (coronary artery disease)    a.  11/2015 CT Chest/Abd: Coronray Ca2+ noted;  b. MV: EF 45-54%, med defect of mod severity in basal infsept, basal inf, mid infsept, and mid inf region-> infarct and peri-infarct ischemia-->intermediate risk; c. 05/2016 Cath/PCI: LM 40ost, LAD 30p, 92m, D1 60ost, LCX 60ost, OM1 99 (small->Med Rx), RCA 60p, 95d (2.5x15 Resolute Onyx DES), RPDA/RPLB min  irregs, D2 60, EF 55-65%/  . Candidiasis of esophagus ([place])   . Colon polyp   . DDD (degenerative disc disease), lumbosacral   . Dysphagia   . Elevated transaminase level   . Esophageal stricture    a. s/p multiple dilations - last 03/2016.  Marland Kitchen Esophagitis, [place] grade D   . Ganglion cyst of finger of right hand   . GERD (gastroesophageal reflux disease)   . Guaiac positive stools   . Hiatal hernia   . HNP (herniated nucleus pulposus), lumbar   . Hypercholesterolemia   . Hypertension   . Lumbar radiculitis   . Mallory-Weiss tear    a. 11/2015.  Marland Kitchen  Migraine aura without headache    migraine, visual  . Psoriasis    with psoriatic arthritis  . Psoriatic arthritis (HCC)    severe  . Schatzki's ring   . Sleep apnea    does not uses CPAP. has lost weight so is much better.  . Type II diabetes mellitus ([place])     Review of systems:      Physical Exam    Heart and lungs: The rate and rhythm without rub or gallop, lungs are bilaterally clear.    HEENT: Normocephalic atraumatic eyes are anicteric    Other:    Pertinant exam for procedure: Soft nontender nondistended bowel sounds positive normoactive    Planned proceedures: EGD, colonoscopy and indicated procedures. I have discussed the risks benefits and complications of procedures to include not limited to bleeding, infection, perforation and the risk of sedation and the patient wishes to proceed.    Lollie Sails, MD Gastroenterology 02/13/2018  7:27 AM

## 2018-02-16 ENCOUNTER — Encounter: Payer: Self-pay | Admitting: Gastroenterology

## 2018-02-17 ENCOUNTER — Other Ambulatory Visit: Payer: Self-pay | Admitting: Internal Medicine

## 2018-02-26 DIAGNOSIS — L4050 Arthropathic psoriasis, unspecified: Secondary | ICD-10-CM | POA: Diagnosis not present

## 2018-02-26 DIAGNOSIS — L409 Psoriasis, unspecified: Secondary | ICD-10-CM | POA: Diagnosis not present

## 2018-02-26 DIAGNOSIS — G5601 Carpal tunnel syndrome, right upper limb: Secondary | ICD-10-CM | POA: Diagnosis not present

## 2018-02-26 DIAGNOSIS — G5602 Carpal tunnel syndrome, left upper limb: Secondary | ICD-10-CM | POA: Diagnosis not present

## 2018-02-26 DIAGNOSIS — Z79899 Other long term (current) drug therapy: Secondary | ICD-10-CM | POA: Diagnosis not present

## 2018-02-27 ENCOUNTER — Other Ambulatory Visit (INDEPENDENT_AMBULATORY_CARE_PROVIDER_SITE_OTHER): Payer: Medicare Other

## 2018-02-27 DIAGNOSIS — E119 Type 2 diabetes mellitus without complications: Secondary | ICD-10-CM

## 2018-02-27 DIAGNOSIS — R945 Abnormal results of liver function studies: Secondary | ICD-10-CM | POA: Diagnosis not present

## 2018-02-27 DIAGNOSIS — E785 Hyperlipidemia, unspecified: Secondary | ICD-10-CM | POA: Diagnosis not present

## 2018-02-27 DIAGNOSIS — I10 Essential (primary) hypertension: Secondary | ICD-10-CM | POA: Diagnosis not present

## 2018-02-27 DIAGNOSIS — R7989 Other specified abnormal findings of blood chemistry: Secondary | ICD-10-CM

## 2018-02-27 LAB — LIPID PANEL
Cholesterol: 104 mg/dL (ref 0–200)
HDL: 38.3 mg/dL — ABNORMAL LOW (ref >39.00)
LDL Cholesterol: 45 mg/dL (ref 0–99)
NONHDL: 65.91
Total CHOL/HDL Ratio: 3
Triglycerides: 107 mg/dL (ref 0.0–149.0)
VLDL: 21.4 mg/dL (ref 0.0–40.0)

## 2018-02-27 LAB — HEPATIC FUNCTION PANEL
ALT: 20 U/L (ref 0–53)
AST: 18 U/L (ref 0–37)
Albumin: 4.3 g/dL (ref 3.5–5.2)
Alkaline Phosphatase: 45 U/L (ref 39–117)
Bilirubin, Direct: 0.1 mg/dL (ref 0.0–0.3)
Total Bilirubin: 0.4 mg/dL (ref 0.2–1.2)
Total Protein: 7.4 g/dL (ref 6.0–8.3)

## 2018-02-27 LAB — BASIC METABOLIC PANEL
BUN: 9 mg/dL (ref 6–23)
CO2: 31 mEq/L (ref 19–32)
Calcium: 9.3 mg/dL (ref 8.4–10.5)
Chloride: 101 mEq/L (ref 96–112)
Creatinine, Ser: 0.89 mg/dL (ref 0.40–1.50)
GFR: 84.65 mL/min (ref >60.00)
Glucose, Bld: 106 mg/dL — ABNORMAL HIGH (ref 70–99)
Potassium: 3.4 mEq/L — ABNORMAL LOW (ref 3.5–5.1)
Sodium: 142 mEq/L (ref 135–145)

## 2018-02-27 LAB — MICROALBUMIN / CREATININE URINE RATIO
Creatinine,U: 351.6 mg/dL
Microalb Creat Ratio: 3.9 mg/g (ref 0.0–30.0)
Microalb, Ur: 13.6 mg/dL — ABNORMAL HIGH (ref 0.0–1.9)

## 2018-02-27 LAB — TSH: TSH: 3.1 u[IU]/mL (ref 0.35–4.50)

## 2018-02-27 LAB — HEMOGLOBIN A1C: Hgb A1c MFr Bld: 6 % (ref 4.6–6.5)

## 2018-03-03 ENCOUNTER — Telehealth: Payer: Self-pay

## 2018-03-03 NOTE — Telephone Encounter (Signed)
Pt stated that he saw the results on his mychart and understands them.

## 2018-03-03 NOTE — Telephone Encounter (Signed)
FYI  Copied from [place] [id number]. Topic: Quick Communication - Lab Results (Clinic Use ONLY) >> Mar 03, 2018 10:36 AM Gordy Councilman, CMA wrote: Called patient to inform them of 02/27/2018 lab results. When patient returns call, triage nurse may disclose results.  Lesia Hausen >> Mar 03, 2018 10:55 AM Yvette Rack wrote: Pt calling back about labs he states that he saw hem on line and that he understands results

## 2018-03-06 ENCOUNTER — Encounter: Payer: Medicare Other | Admitting: Internal Medicine

## 2018-03-13 ENCOUNTER — Ambulatory Visit (INDEPENDENT_AMBULATORY_CARE_PROVIDER_SITE_OTHER): Payer: Medicare Other | Admitting: Internal Medicine

## 2018-03-13 ENCOUNTER — Encounter: Payer: Self-pay | Admitting: Internal Medicine

## 2018-03-13 VITALS — BP 118/70 | HR 76 | Temp 97.8°F | Resp 16 | Ht 68.0 in | Wt 188.0 lb

## 2018-03-13 DIAGNOSIS — I2510 Atherosclerotic heart disease of native coronary artery without angina pectoris: Secondary | ICD-10-CM

## 2018-03-13 DIAGNOSIS — E785 Hyperlipidemia, unspecified: Secondary | ICD-10-CM

## 2018-03-13 DIAGNOSIS — E876 Hypokalemia: Secondary | ICD-10-CM

## 2018-03-13 DIAGNOSIS — E119 Type 2 diabetes mellitus without complications: Secondary | ICD-10-CM

## 2018-03-13 DIAGNOSIS — Z0000 Encounter for general adult medical examination without abnormal findings: Secondary | ICD-10-CM

## 2018-03-13 DIAGNOSIS — R7989 Other specified abnormal findings of blood chemistry: Secondary | ICD-10-CM

## 2018-03-13 DIAGNOSIS — E0822 Diabetes mellitus due to underlying condition with diabetic chronic kidney disease: Secondary | ICD-10-CM

## 2018-03-13 DIAGNOSIS — D649 Anemia, unspecified: Secondary | ICD-10-CM | POA: Diagnosis not present

## 2018-03-13 DIAGNOSIS — I10 Essential (primary) hypertension: Secondary | ICD-10-CM

## 2018-03-13 DIAGNOSIS — N183 Chronic kidney disease, stage 3 (moderate): Secondary | ICD-10-CM

## 2018-03-13 DIAGNOSIS — Z125 Encounter for screening for malignant neoplasm of prostate: Secondary | ICD-10-CM | POA: Diagnosis not present

## 2018-03-13 DIAGNOSIS — R945 Abnormal results of liver function studies: Secondary | ICD-10-CM

## 2018-03-13 DIAGNOSIS — K219 Gastro-esophageal reflux disease without esophagitis: Secondary | ICD-10-CM

## 2018-03-13 DIAGNOSIS — L4050 Arthropathic psoriasis, unspecified: Secondary | ICD-10-CM

## 2018-03-13 NOTE — Assessment & Plan Note (Addendum)
Physical today 03/13/18.  Check psa.  Just had colonoscopy.

## 2018-03-13 NOTE — Progress Notes (Signed)
Patient ID: Shane Mcknight, male   DOB: 13-Jun-1950, 68 y.o.   MRN: [id number]   Subjective:    Patient ID: Shane Mcknight, male    DOB: 1950/08/11, 68 y.o.   MRN: [id number]  HPI  Patient here for his physical exam.  States he is doing relatively well.  Recently inguinal hernia surgery.  Doing well.  Healing well.  Still with some right hip discomfort. Plans to discuss with Dr Jefm Bryant.  Also, still having issues with bilateral hands.  Stable.  Has seen ortho.  Has been seeing GI.  S/p EGD and colonoscopy.  Planning to f/u with [facility].  No chest pain.  No sob.  No abdominal pain.  Bowels moving.  No urine change.  Discussed recent labs.  Low potassium.  Recheck today.  Overall he feels things are fairly stable.     Past Medical History:  Diagnosis Date  . Abnormal liver function tests   . Allergic state   . Anemia   . Arthritis    spine, hands, shoulder with previous cuff tear  . CAD (coronary artery disease)    a.  11/2015 CT Chest/Abd: Coronray Ca2+ noted;  b. MV: EF 45-54%, med defect of mod severity in basal infsept, basal inf, mid infsept, and mid inf region-> infarct and peri-infarct ischemia-->intermediate risk; c. 05/2016 Cath/PCI: LM 40ost, LAD 30p, 66m D1 60ost, LCX 60ost, OM1 99 (small->Med Rx), RCA 60p, 95d (2.5x15 Resolute Onyx DES), RPDA/RPLB min irregs, D2 60, EF 55-65%/  . Candidiasis of esophagus ([place]   . Colon polyp   . DDD (degenerative disc disease), lumbosacral   . Dysphagia   . Elevated transaminase level   . Esophageal stricture    a. s/p multiple dilations - last 03/2016.  .Marland KitchenEsophagitis, [place] grade D   . Ganglion cyst of finger of right hand   . GERD (gastroesophageal reflux disease)   . Guaiac positive stools   . Hiatal hernia   . HNP (herniated nucleus pulposus), lumbar   . Hypercholesterolemia   . Hypertension   . Lumbar radiculitis   . Mallory-Weiss tear    a. 11/2015.  . Migraine aura without headache    migraine, visual  . Psoriasis    with  psoriatic arthritis  . Psoriatic arthritis (HCC)    severe  . Schatzki's ring   . Sleep apnea    does not uses CPAP. has lost weight so is much better.  . Type II diabetes mellitus ([place]    Past Surgical History:  Procedure Laterality Date  . ARTHROPLASTY     right thumb; left thumb with 2 screws and plastic joint  . BALLOON DILATION N/A 05/29/2015   Procedure: BALLOON DILATION;  Surgeon: MLollie Sails MD;  [facility];  Service: Endoscopy;  Laterality: N/A;  . BALLOON DILATION N/A 02/14/2017   Procedure: BALLOON DILATION;  Surgeon: SLollie Sails MD;  [facility];  Service: Endoscopy;  Laterality: N/A;  . BILATERAL CARPAL TUNNEL RELEASE Bilateral 07/04/2017   Procedure: BILATERAL CARPAL TUNNEL RELEASE;  Surgeon: KLeanor Kail MD;  Location: [facility] ORS;  Service: Orthopedics;  Laterality: Bilateral;  . BLEPHAROPLASTY    . CARDIAC CATHETERIZATION     1 stent   . CARPAL TUNNEL RELEASE  2005   right  . CHOLECYSTECTOMY  2008  . COLONOSCOPY    . COLONOSCOPY WITH PROPOFOL N/A 02/13/2018   Procedure: COLONOSCOPY WITH PROPOFOL;  Surgeon: SLollie Sails MD;  [facility];  Service: Endoscopy;  Laterality: N/A;  . CORONARY STENT INTERVENTION N/A 06/14/2016   Procedure: Coronary Stent Intervention;  Surgeon: Wellington Hampshire, MD;  Location: [place] [facility] LAB;  Service: Cardiovascular;  Laterality: N/A;  . CYST EXCISION  04/14/2015   tendon sheath cyst excision; right ring finger cyst removed and trigger finger realease  . ESOPHAGOGASTRODUODENOSCOPY    . ESOPHAGOGASTRODUODENOSCOPY (EGD) WITH PROPOFOL N/A 05/29/2015   Procedure: ESOPHAGOGASTRODUODENOSCOPY (EGD) WITH PROPOFOL;  Surgeon: Lollie Sails, MD;  [facility] END[facility]COPY;  Service: Endoscopy;  Laterality: N/A;  . ESOPHAGOGASTRODUODENOSCOPY (EGD) WITH PROPOFOL N/A 07/04/2015   Procedure: ESOPHAGOGASTRODUODENOSCOPY (EGD) WITH PROPOFOL;  Surgeon: Lollie Sails, MD;  Location[facility];  Service: Endoscopy;  Laterality: N/A;  . ESOPHAGOGASTRODUODENOSCOPY (EGD) WITH PROPOFOL N/A 01/04/2016   Procedure: ESOPHAGOGASTRODUODENOSCOPY (EGD) WITH PROPOFOL;  Surgeon: Lollie Sails, MD;  Location[facility] ENDOSCOPY;  Service: Endoscopy;  Laterality: N/A;  . ESOPHAGOGASTRODUODENOSCOPY (EGD) WITH PROPOFOL N/A 03/22/2016   Procedure: ESOPHAGOGASTRODUODENOSCOPY (EGD) WITH PROPOFOL;  Surgeon: Lollie Sails, MD;  Location: [facility] ENDOSCOPY;  Service: Endoscopy;  Laterality: N/A;  . ESOPHAGOGASTRODUODENOSCOPY (EGD) WITH PROPOFOL N/A 02/14/2017   Procedure: ESOPHAGOGASTRODUODENOSCOPY (EGD) WITH PROPOFOL;  Surgeon: Lollie Sails, MD;  Location: [facility] ENDOSCOPY;  Service: Endoscopy;  Laterality: N/A;  . ESOPHAGOGASTRODUODENOSCOPY (EGD) WITH PROPOFOL N/A 04/11/2017   Procedure: ESOPHAGOGASTRODUODENOSCOPY (EGD) WITH PROPOFOL;  Surgeon: Lollie Sails, MD;  Location: [facility] ENDOSCOPY;  Service: Endoscopy;  Laterality: N/A;  . ESOPHAGOGASTRODUODENOSCOPY (EGD) WITH PROPOFOL N/A 07/07/2017   Procedure: ESOPHAGOGASTRODUODENOSCOPY (EGD) WITH PROPOFOL;  Surgeon: Lucilla Lame, MD;  Location: [facility] ENDOSCOPY;  Service: Endoscopy;  Laterality: N/A;  . ESOPHAGOGASTRODUODENOSCOPY (EGD) WITH PROPOFOL N/A 02/13/2018   Procedure: ESOPHAGOGASTRODUODENOSCOPY (EGD) WITH PROPOFOL;  Surgeon: Lollie Sails, MD;  Location: [facility] ENDOSCOPY;  Service: Endoscopy;  Laterality: N/A;  . EYE SURGERY    . FOREIGN BODY REMOVAL N/A 12/03/2015   Procedure: FOREIGN BODY REMOVAL;  Surgeon: Wilford Corner, MD;  Location: [facility] ENDOSCOPY;  Service: Endoscopy;  Laterality: N/A;  . HERNIA REPAIR Right 1970   inguinal  . INGUINAL HERNIA REPAIR Left 01/01/2018   Procedure: OPEN HERNIA REPAIR INGUINAL ADULT;  Surgeon: Olean Ree, MD;  Location: [facility] ORS;  Service: General;  Laterality: Left;  . LEFT HEART CATH Left 06/14/2016   Procedure: Left Heart Cath;  Surgeon: Wellington Hampshire, MD;  Location: [place] [facility] LAB;   Service: Cardiovascular;  Laterality: Left;  . LUMBAR DISC SURGERY  2004   injections; herniated disc; percutaneous discectomy  . NASAL SINUS SURGERY  2008  . percutaneous lumbar discectomy    . ROTATOR CUFF REPAIR Right   . TONSILLECTOMY     Family History  Problem Relation Age of Onset  . Lung cancer Father   . Cancer Father   . Hypertension Mother   . Aneurysm Son    Social History   Socioeconomic History  . Marital status: Married    Spouse name: Pamala Hurry  . Number of children: Not on file  . Years of education: Not on file  . Highest education level: Not on file  Occupational History    Comment: not heavy work  Social Needs  . Financial resource strain: Not on file  . Food insecurity:    Worry: Not on file    Inability: Not on file  . Transportation needs:    Medical: Not on file    Non-medical: Not on file  Tobacco Use  . Smoking status: Former Smoker    Types: Cigarettes  Last attempt to quit: 07/06/1982    Years since quitting: 35.7  . Smokeless tobacco: Never Used  Substance and Sexual Activity  . Alcohol use: Yes    Alcohol/week: 0.0 standard drinks    Comment: rarely  . Drug use: No  . Sexual activity: Not on file  Lifestyle  . Physical activity:    Days per week: Not on file    Minutes per session: Not on file  . Stress: Not on file  Relationships  . Social connections:    Talks on phone: Not on file    Gets together: Not on file    Attends religious service: Not on file    Active member of club or organization: Not on file    Attends meetings of clubs or organizations: Not on file    Relationship status: Not on file  Other Topics Concern  . Not on file  Social History Narrative   Lives locally with wife.  Active but does not routinely exercise.    Outpatient Encounter Medications as of 03/13/2018  Medication Sig  . acetaminophen (TYLENOL) 500 MG tablet Take 1,000 mg by mouth daily as needed for moderate pain.  Marland Kitchen aspirin EC 81 MG tablet  Take 81 mg by mouth every evening.   Marland Kitchen atenolol (TENORMIN) 50 MG tablet TAKE 1 TABLET BY MOUTH  TWICE A DAY  . clindamycin (CLEOCIN) 300 MG capsule Take 1 capsule (300 mg total) by mouth 3 (three) times daily.  [place] SENSOREADY PEN 150 MG/ML SOAJ Inject 300 mg into the skin every 30 (thirty) days.   . cyanocobalamin (,VITAMIN B-12,) 1000 MCG/ML injection Inject 1 mL into the muscle once a week for the next 3 weeks and then once every 30 days. (Patient taking differently: Inject 1,000 mcg into the muscle every 30 (thirty) days. )  . Fe Fum-FePoly-Vit C-Vit B3 (INTEGRA) 62.5-62.5-40-3 MG CAPS Take 1 capsule by mouth daily. (Patient taking differently: Take 1 capsule by mouth every evening. )  . FOLIC ACID PO Take 10 mcg by mouth every evening.   . gabapentin (NEURONTIN) 300 MG capsule TAKE 1 CAPSULE BY MOUTH AT  BEDTIME AS NEEDED FOR PAIN  . hydrochlorothiazide (HYDRODIURIL) 25 MG tablet Take 1 tablet (25 mg total) by mouth daily.  Marland Kitchen losartan (COZAAR) 50 MG tablet TAKE 1 TABLET BY MOUTH  DAILY  . metFORMIN (GLUCOPHAGE) 500 MG tablet Take 2 tablets (1,000 mg total) by mouth 2 (two) times daily with a meal.  . Multiple Vitamin (MULTIVITAMIN) tablet Take 1 tablet by mouth daily.  . nitroGLYCERIN (NITROSTAT) 0.4 MG SL tablet Place 1 tablet (0.4 mg total) under the tongue every 5 (five) minutes as needed. MAXIMUM OF 3 DOSES.  Marland Kitchen oxyCODONE (OXY IR/ROXICODONE) 5 MG immediate release tablet Take 1 tablet (5 mg total) by mouth every 6 (six) hours as needed for severe pain. (Patient not taking: Reported on 02/13/2018)  . pantoprazole (PROTONIX) 40 MG tablet TAKE 1 TABLET BY MOUTH  TWICE A DAY  . rosuvastatin (CRESTOR) 20 MG tablet TAKE 1 TABLET BY MOUTH EVERY DAY AT 6: 00 PM  . SYRINGE-NEEDLE, DISP, 3 ML 25G X 5/8" 3 ML MISC Use as instructed with B12 injection.  . triamcinolone cream (KENALOG) 0.5 % Apply 1 application topically 2 (two) times daily as needed (scaling).  . vitamin E 400 UNIT capsule Take  400 Units by mouth every evening.   No facility-administered encounter medications on file as of 03/13/2018.     Review of Systems  Constitutional: Negative for appetite change and unexpected weight change.  HENT: Negative for congestion and sinus pressure.   Eyes: Negative for pain and visual disturbance.  Respiratory: Negative for cough, chest tightness and shortness of breath.   Cardiovascular: Negative for chest pain, palpitations and leg swelling.  Gastrointestinal: Negative for abdominal pain, diarrhea and nausea.  Genitourinary: Negative for difficulty urinating and dysuria.  Musculoskeletal: Negative for joint swelling and myalgias.       Left shoulder pain.  Stable.  Some persistent wrist/hand issues.  Seeing ortho.  Some right hip discomfort as outlined.    Skin: Negative for rash and wound.  Neurological: Negative for dizziness and headaches.  Hematological: Negative for adenopathy. Does not bruise/bleed easily.  Psychiatric/Behavioral: Negative for decreased concentration and dysphoric mood.       Objective:    Physical Exam Constitutional:      General: He is not in acute distress.    Appearance: Normal appearance. He is well-developed.  HENT:     Head: Normocephalic and atraumatic.     Nose: Nose normal. No congestion.     Mouth/Throat:     Pharynx: No oropharyngeal exudate or posterior oropharyngeal erythema.  Eyes:     General:        Right eye: No discharge.        Left eye: No discharge.     Conjunctiva/sclera: Conjunctivae normal.  Neck:     Musculoskeletal: Neck supple. No muscular tenderness.     Thyroid: No thyromegaly.  Cardiovascular:     Rate and Rhythm: Normal rate and regular rhythm.  Pulmonary:     Effort: No respiratory distress.     Breath sounds: Normal breath sounds. No wheezing.  Abdominal:     General: Bowel sounds are normal.     Palpations: Abdomen is soft.     Tenderness: There is no abdominal tenderness.  Genitourinary:     Comments: Not performed.  Musculoskeletal:        General: No swelling or tenderness.  Lymphadenopathy:     Cervical: No cervical adenopathy.  Skin:    Findings: No erythema or rash.  Neurological:     Mental Status: He is alert and oriented to person, place, and time.  Psychiatric:        Mood and Affect: Mood normal.        Behavior: Behavior normal.     BP 118/70   Pulse 76   Temp 97.8 F (36.6 C) (Oral)   Resp 16   Ht _0  (1.727 m)   Wt 188 lb (85.3 kg)   SpO2 98%   BMI 28.59 kg/m  Wt Readings from Last 3 Encounters:  03/13/18 188 lb (85.3 kg)  02/13/18 179 lb (81.2 kg)  01/23/18 186 lb 6.4 oz (84.6 kg)     Lab Results  Component Value Date   WBC 6.5 10/29/2017   HGB 13.8 10/29/2017   HCT 39.5 10/29/2017   PLT 173.0 10/29/2017   GLUCOSE 106 (H) 02/27/2018   CHOL 104 02/27/2018   TRIG 107.0 02/27/2018   HDL 38.30 (L) 02/27/2018   LDLCALC 45 02/27/2018   ALT 20 02/27/2018   AST 18 02/27/2018   NA 142 02/27/2018   K 3.8 03/13/2018   CL 101 02/27/2018   CREATININE 0.89 02/27/2018   BUN 9 02/27/2018   CO2 31 02/27/2018   TSH 3.10 02/27/2018   PSA 1.5 03/13/2018   INR 1.1 06/07/2016   HGBA1C 6.0 02/27/2018   MICROALBUR 13.6 (  H) 02/27/2018    [place] Soft Tissue Non Vascular  Result Date: 01/30/2018 CLINICAL DATA:  Left inguinal region pain since inguinal hernia repair on 01/01/2018. EXAM: ULTRASOUND LEFT LOWER EXTREMITY LIMITED TECHNIQUE: Ultrasound examination of the lower extremity soft tissues was performed in the area of clinical concern. COMPARISON:  None. FINDINGS: There is a complex mass containing fluid components, below the inguinal herniorrhaphy scar. This extends for 12 x 3 x 5 cm, longest dimension from lateral to medial. This is most likely a hematoma. No visualized hernia. IMPRESSION: 1. Complex mass with fluid components, most likely a hematoma, deep to the herniorrhaphy scar, measuring 12 x 3 x 5 cm. Electronically Signed    By: Lajean Manes M.D.   On: 01/30/2018 08:33       Assessment & Plan:   Problem List Items Addressed This Visit    Abnormal liver function tests    Follow liver function tests.        Anemia    Seeing GI.  Follow cbc.        CAD (coronary artery disease)    S/p stent RCA.  On effient.  Followed by cardiology.  No chest pain.  Continue risk factor modification.       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  Follow met b and a1c.   Lab Results  Component Value Date   HGBA1C 6.0 02/27/2018        Relevant Orders   Hemoglobin G5X   Basic metabolic panel   GERD (gastroesophageal reflux disease)    Just had f/u EGD.  Continue current regimen.  Continue f/u with GI.        Health care maintenance    Physical today 03/13/18.  Check psa.  Just had colonoscopy.        Hyperlipidemia    On crestor.  Low cholesterol diet and exercise.  Follow lipid panel and liver function tests.        Relevant Orders   Hepatic function panel   Lipid panel   Hypertension    Blood pressure under good control.  Continue same medication regimen.  Follow pressures.  Follow metabolic panel.        Psoriatic arthritis ([place])    Followed by Dr Jefm Bryant.  Stable.  Plans to discuss right hip pain with him.  Follow.        Other Visit Diagnoses    Routine general medical examination at a health care facility    -  Primary   Hypokalemia       Relevant Orders   Potassium (Completed)   Prostate cancer screening       Relevant Orders   PSA (Completed)       Einar Pheasant, MD

## 2018-03-14 ENCOUNTER — Encounter: Payer: Self-pay | Admitting: Internal Medicine

## 2018-03-14 LAB — PSA: PSA: 1.5 ng/mL (ref <=4.0)

## 2018-03-14 LAB — POTASSIUM: Potassium: 3.8 mmol/L (ref 3.5–5.3)

## 2018-03-15 ENCOUNTER — Encounter: Payer: Self-pay | Admitting: Internal Medicine

## 2018-03-15 NOTE — Assessment & Plan Note (Signed)
Low carb diet and exercise.  Follow met b and a1c.   Lab Results  Component Value Date   HGBA1C 6.0 02/27/2018

## 2018-03-15 NOTE — Assessment & Plan Note (Signed)
On crestor.  Low cholesterol diet and exercise.  Follow lipid panel and liver function tests.   

## 2018-03-15 NOTE — Assessment & Plan Note (Signed)
S/p stent RCA.  On effient.  Followed by cardiology.  No chest pain.  Continue risk factor modification.

## 2018-03-15 NOTE — Assessment & Plan Note (Signed)
Blood pressure under good control.  Continue same medication regimen.  Follow pressures.  Follow metabolic panel.   

## 2018-03-15 NOTE — Assessment & Plan Note (Signed)
Seeing GI.  Follow cbc.  

## 2018-03-15 NOTE — Assessment & Plan Note (Signed)
Followed by Dr Jefm Bryant.  Stable.  Plans to discuss right hip pain with him.  Follow.

## 2018-03-15 NOTE — Assessment & Plan Note (Signed)
Follow liver function tests.   

## 2018-03-15 NOTE — Assessment & Plan Note (Signed)
Just had f/u EGD.  Continue current regimen.  Continue f/u with GI.

## 2018-04-15 DIAGNOSIS — R1310 Dysphagia, unspecified: Secondary | ICD-10-CM | POA: Diagnosis not present

## 2018-04-15 DIAGNOSIS — K222 Esophageal obstruction: Secondary | ICD-10-CM | POA: Diagnosis not present

## 2018-04-20 ENCOUNTER — Other Ambulatory Visit: Payer: Self-pay | Admitting: Internal Medicine

## 2018-04-30 ENCOUNTER — Other Ambulatory Visit: Payer: Self-pay | Admitting: Internal Medicine

## 2018-05-12 DIAGNOSIS — L4050 Arthropathic psoriasis, unspecified: Secondary | ICD-10-CM | POA: Diagnosis not present

## 2018-06-04 ENCOUNTER — Other Ambulatory Visit: Payer: Self-pay | Admitting: Internal Medicine

## 2018-06-04 ENCOUNTER — Other Ambulatory Visit: Payer: Self-pay | Admitting: *Deleted

## 2018-06-04 MED ORDER — ROSUVASTATIN CALCIUM 20 MG PO TABS: ORAL_TABLET | ORAL | 2 refills | Status: DC

## 2018-06-08 ENCOUNTER — Other Ambulatory Visit: Payer: Self-pay | Admitting: Internal Medicine

## 2018-06-12 DIAGNOSIS — Z79899 Other long term (current) drug therapy: Secondary | ICD-10-CM | POA: Diagnosis not present

## 2018-06-12 DIAGNOSIS — L4050 Arthropathic psoriasis, unspecified: Secondary | ICD-10-CM | POA: Diagnosis not present

## 2018-06-18 ENCOUNTER — Telehealth: Payer: Self-pay

## 2018-06-18 NOTE — Telephone Encounter (Signed)
Virtual Visit Pre-Appointment Phone Call  "[name], I am calling you today to discuss your upcoming appointment. We are currently trying to limit exposure to the virus that causes COVID-19 by seeing patients at home rather than in the office."  1. "What is the BEST phone number to call the day of the visit?" - include this in appointment notes  2. Do you have or have access to (through a family member/friend) a smartphone with video capability that we can use for your visit?" a. If yes - list this number in appt notes as cell (if different from BEST phone #) and list the appointment type as a VIDEO visit in appointment notes b. If no - list the appointment type as a PHONE visit in appointment notes  3. Confirm consent - "In the setting of the current Covid19 crisis, you are scheduled for a phone visit with your provider on 06/23/2018 at 1:30PM.  Just as we do with many in-office visits, in order for you to participate in this visit, we must obtain consent.  If you'd like, I can send this to your mychart (if signed up) or email for you to review.  Otherwise, I can obtain your verbal consent now.  All virtual visits are billed to your insurance company just like a normal visit would be.  By agreeing to a virtual visit, we'd like you to understand that the technology does not allow for your provider to perform an examination, and thus may limit your provider's ability to fully assess your condition. If your provider identifies any concerns that need to be evaluated in person, we will make arrangements to do so.  Finally, though the technology is pretty good, we cannot assure that it will always work on either your or our end, and in the setting of a video visit, we may have to convert it to a phone-only visit.  In either situation, we cannot ensure that we have a secure connection.  Are you willing to proceed?" STAFF: Did the patient verbally acknowledge consent to telehealth visit? Document YES/NO here:  YES  4. Advise patient to be prepared - "Two hours prior to your appointment, go ahead and check your blood pressure, pulse, oxygen saturation, and your weight (if you have the equipment to check those) and write them all down. When your visit starts, your provider will ask you for this information. If you have an Apple Watch or Kardia device, please plan to have heart rate information ready on the day of your appointment. Please have a pen and paper handy nearby the day of the visit as well."  5. Give patient instructions for MyChart download to smartphone OR [email or web address] as below if video visit (depending on what platform provider is using)  6. Inform patient they will receive a phone call 15 minutes prior to their appointment time (may be from unknown caller ID) so they should be prepared to answer    Shane Mcknight has been deemed a candidate for a follow-up tele-health visit to limit community exposure during the Covid-19 pandemic. I spoke with the patient via phone to ensure availability of phone/video source, confirm preferred email & phone number, and discuss instructions and expectations.  I reminded Shane Mcknight to be prepared with any vital sign and/or heart rhythm information that could potentially be obtained via home monitoring, at the time of his visit. I reminded Shane Mcknight to expect a phone call prior to his visit.  Shane Mcknight 06/18/2018 11:48 AM    FULL LENGTH CONSENT FOR TELE-HEALTH VISIT   I hereby voluntarily request, consent and authorize [facility] and its employed or contracted physicians, physician assistants, nurse practitioners or other licensed health care professionals (the Practitioner), to provide me with telemedicine health care services (the Services") as deemed necessary by the treating Practitioner. I acknowledge and consent to receive the Services by the Practitioner via telemedicine. I understand that the  telemedicine visit will involve communicating with the Practitioner through live audiovisual communication technology and the disclosure of certain medical information by electronic transmission. I acknowledge that I have been given the opportunity to request an in-person assessment or other available alternative prior to the telemedicine visit and am voluntarily participating in the telemedicine visit.  I understand that I have the right to withhold or withdraw my consent to the use of telemedicine in the course of my care at any time, without affecting my right to future care or treatment, and that the Practitioner or I may terminate the telemedicine visit at any time. I understand that I have the right to inspect all information obtained and/or recorded in the course of the telemedicine visit and may receive copies of available information for a reasonable fee.  I understand that some of the potential risks of receiving the Services via telemedicine include:   Delay or interruption in medical evaluation due to technological equipment failure or disruption;  Information transmitted may not be sufficient (e.g. poor resolution of images) to allow for appropriate medical decision making by the Practitioner; and/or   In rare instances, security protocols could fail, causing a breach of personal health information.  Furthermore, I acknowledge that it is my responsibility to provide information about my medical history, conditions and care that is complete and accurate to the best of my ability. I acknowledge that Practitioner's advice, recommendations, and/or decision may be based on factors not within their control, such as incomplete or inaccurate data provided by me or distortions of diagnostic images or specimens that may result from electronic transmissions. I understand that the practice of medicine is not an exact science and that Practitioner makes no warranties or guarantees regarding treatment  outcomes. I acknowledge that I will receive a copy of this consent concurrently upon execution via email to the email address I last provided but may also request a printed copy by calling the office of [facility].    I understand that my insurance will be billed for this visit.   I have read or had this consent read to me.  I understand the contents of this consent, which adequately explains the benefits and risks of the Services being provided via telemedicine.   I have been provided ample opportunity to ask questions regarding this consent and the Services and have had my questions answered to my satisfaction.  I give my informed consent for the services to be provided through the use of telemedicine in my medical care  By participating in this telemedicine visit I agree to the above.

## 2018-06-22 NOTE — Progress Notes (Signed)
Virtual Visit via Telephone Note   This visit type was conducted due to national recommendations for restrictions regarding the COVID-19 Pandemic (e.g. social distancing) in an effort to limit this patient's exposure and mitigate transmission in our community.  Due to his co-morbid illnesses, this patient is at least at moderate risk for complications without adequate follow up.  This format is felt to be most appropriate for this patient at this time.  The patient did not have access to video technology/had technical difficulties with video requiring transitioning to audio format only (telephone).  All issues noted in this document were discussed and addressed.  No physical exam could be performed with this format.  Please refer to the patient's chart for his  consent to telehealth for [facility].   Date:  06/23/2018   ID:  Shane Mcknight, DOB 10/21/1950, MRN [id number]  Patient Location: Home Provider Location: Home  PCP:  Shane Pheasant, MD  Cardiologist:  Shane Sacramento, MD  Electrophysiologist:  None   Evaluation Performed:  Follow-Up Visit  Chief Complaint:  Follow up  History of Present Illness:    Shane Mcknight is a 68 y.o. male with history of CAD status post RCA stenting in 06/2016, HTN, HLD, DM2, carotid artery disease, with 1-39% RICA stenosis and 30-09% LICA stenosis from 02/3298, sleep apnea, GERD, hiatal hernia, bilateral carpal tunnel syndrome s/p bilateral release in 06/2017, anemia, and esophageal stricture, Mallory-Weiss tear, Schatzki ring s/p prior EGD x 2 in 2019 who presents for follow up of his CAD.  Patient underwent stress testing in 05/2016 that was abnormal which was followed up by a LHC in 06/2016 that showed severe 2-vessel CAD involving the RCA and OM1 as outlined below. Cath details: ostial left main 40%, proximal LAD 30%, mid LAD 50%, D1 60%, ostial LCx 60%, OM1 99%, proximal RCA 60%, distal RCA 95%, 2nd RPLB 60%. He underwent successful PCI/DES to the distal  RCA. The subtotal occlusion of the OM1 was medically managed given this was a small vessel of less than 2 mm in diameter and supplied a small area. There was no pressure dampening with engagement of the left main. Normal LVSF with normal LVEDP. He was last seen in the office in 11/2017 for preoperative evaluation for inguinal hernia repair and was doing well. He subsequently underwent hernia repair in 12/2017.   He is seen in telemedicine follow up today and is doing very well. No chest pain, SOB, palpitations, dizziness, presyncope, or syncope. No lower extremity swelling, abdominal distension, orthopnea, PND or early satiety. He is now having to mow his son's yard as well, which is approximately 3 times the size of the patient's yard. He is doing this with a push mower and has been without any concerning symptoms. Blood pressure remains well controlled. No falls. He does not have any concerns at this time.   Labs: 02/2018 - LDL 45, LFT normal, A1c 6.0, TSH normal, K+ 3.8, SCr 0.89   The patient does not have symptoms concerning for COVID-19 infection (fever, chills, cough, or new shortness of breath).    Past Medical History:  Diagnosis Date   Abnormal liver function tests    Allergic state    Anemia    Arthritis    spine, hands, shoulder with previous cuff tear   CAD (coronary artery disease)    a.  11/2015 CT Chest/Abd: Coronray Ca2+ noted;  b. MV: EF 45-54%, med defect of mod severity in basal infsept, basal inf, mid infsept,  and mid inf region-> infarct and peri-infarct ischemia-->intermediate risk; c. 05/2016 Cath/PCI: LM 40ost, LAD 30p, 25m, D1 60ost, LCX 60ost, OM1 99 (small->Med Rx), RCA 60p, 95d (2.5x15 Resolute Onyx DES), RPDA/RPLB min irregs, D2 60, EF 55-65%/   Candidiasis of esophagus (HCC)    Colon polyp    DDD (degenerative disc disease), lumbosacral    Dysphagia    Elevated transaminase level    Esophageal stricture    a. s/p multiple dilations - last 03/2016.    Esophagitis, [place] grade D    Ganglion cyst of finger of right hand    GERD (gastroesophageal reflux disease)    Guaiac positive stools    Hiatal hernia    HNP (herniated nucleus pulposus), lumbar    Hypercholesterolemia    Hypertension    Lumbar radiculitis    Mallory-Weiss tear    a. 11/2015.   Migraine aura without headache    migraine, visual   Psoriasis    with psoriatic arthritis   Psoriatic arthritis ([place])    severe   Schatzki's ring    Sleep apnea    does not uses CPAP. has lost weight so is much better.   Type II diabetes mellitus ([place])    Past Surgical History:  Procedure Laterality Date   ARTHROPLASTY     right thumb; left thumb with 2 screws and plastic joint   BALLOON DILATION N/A 05/29/2015   Procedure: BALLOON DILATION;  Surgeon: Lollie Sails, MD;  Location: [facility] ENDOSCOPY;  Service: Endoscopy;  Laterality: N/A;   BALLOON DILATION N/A 02/14/2017   Procedure: BALLOON DILATION;  Surgeon: Lollie Sails, MD;  Location[facility] ENDOSCOPY;  Service: Endoscopy;  Laterality: N/A;   BILATERAL CARPAL TUNNEL RELEASE Bilateral 07/04/2017   Procedure: BILATERAL CARPAL TUNNEL RELEASE;  Surgeon: Leanor Kail, MD;  Location: [facility] ORS;  Service: Orthopedics;  Laterality: Bilateral;   BLEPHAROPLASTY     CARDIAC CATHETERIZATION     1 stent    CARPAL TUNNEL RELEASE  2005   right   CHOLECYSTECTOMY  2008   COLONOSCOPY     COLONOSCOPY WITH PROPOFOL N/A 02/13/2018   Procedure: COLONOSCOPY WITH PROPOFOL;  Surgeon: Lollie Sails, MD;  Location: [facility] ENDOSCOPY;  Service: Endoscopy;  Laterality: N/A;   CORONARY STENT INTERVENTION N/A 06/14/2016   Procedure: Coronary Stent Intervention;  Surgeon: Wellington Hampshire, MD;  Location: [place] CV LAB;  Service: Cardiovascular;  Laterality: N/A;   CYST EXCISION  04/14/2015   tendon sheath cyst excision; right ring finger cyst removed and trigger finger realease   ESOPHAGOGASTRODUODENOSCOPY      ESOPHAGOGASTRODUODENOSCOPY (EGD) WITH PROPOFOL N/A 05/29/2015   Procedure: ESOPHAGOGASTRODUODENOSCOPY (EGD) WITH PROPOFOL;  Surgeon: Lollie Sails, MD;  Location: [facility] ENDOSCOPY;  Service: Endoscopy;  Laterality: N/A;   ESOPHAGOGASTRODUODENOSCOPY (EGD) WITH PROPOFOL N/A 07/04/2015   Procedure: ESOPHAGOGASTRODUODENOSCOPY (EGD) WITH PROPOFOL;  Surgeon: Lollie Sails, MD;  Location: [facility] ENDOSCOPY;  Service: Endoscopy;  Laterality: N/A;   ESOPHAGOGASTRODUODENOSCOPY (EGD) WITH PROPOFOL N/A 01/04/2016   Procedure: ESOPHAGOGASTRODUODENOSCOPY (EGD) WITH PROPOFOL;  Surgeon: Lollie Sails, MD;  Location: [facility] ENDOSCOPY;  Service: Endoscopy;  Laterality: N/A;   ESOPHAGOGASTRODUODENOSCOPY (EGD) WITH PROPOFOL N/A 03/22/2016   Procedure: ESOPHAGOGASTRODUODENOSCOPY (EGD) WITH PROPOFOL;  Surgeon: Lollie Sails, MD;  Location: [facility] ENDOSCOPY;  Service: Endoscopy;  Laterality: N/A;   ESOPHAGOGASTRODUODENOSCOPY (EGD) WITH PROPOFOL N/A 02/14/2017   Procedure: ESOPHAGOGASTRODUODENOSCOPY (EGD) WITH PROPOFOL;  Surgeon: Lollie Sails, MD;  Location: [facility] ENDOSCOPY;  Service: Endoscopy;  Laterality: N/A;  ESOPHAGOGASTRODUODENOSCOPY (EGD) WITH PROPOFOL N/A 04/11/2017   Procedure: ESOPHAGOGASTRODUODENOSCOPY (EGD) WITH PROPOFOL;  Surgeon: Lollie Sails, MD;  Location: [facility] ENDOSCOPY;  Service: Endoscopy;  Laterality: N/A;   ESOPHAGOGASTRODUODENOSCOPY (EGD) WITH PROPOFOL N/A 07/07/2017   Procedure: ESOPHAGOGASTRODUODENOSCOPY (EGD) WITH PROPOFOL;  Surgeon: Lucilla Lame, MD;  Location: [facility] ENDOSCOPY;  Service: Endoscopy;  Laterality: N/A;   ESOPHAGOGASTRODUODENOSCOPY (EGD) WITH PROPOFOL N/A 02/13/2018   Procedure: ESOPHAGOGASTRODUODENOSCOPY (EGD) WITH PROPOFOL;  Surgeon: Lollie Sails, MD;  Location: [facility] ENDOSCOPY;  Service: Endoscopy;  Laterality: N/A;   EYE SURGERY     FOREIGN BODY REMOVAL N/A 12/03/2015   Procedure: FOREIGN BODY REMOVAL;  Surgeon: Wilford Corner, MD;  Location: [facility]  ENDOSCOPY;  Service: Endoscopy;  Laterality: N/A;   HERNIA REPAIR Right 1970   inguinal   INGUINAL HERNIA REPAIR Left 01/01/2018   Procedure: OPEN HERNIA REPAIR INGUINAL ADULT;  Surgeon: Olean Ree, MD;  Location: [facility] ORS;  Service: General;  Laterality: Left;   LEFT HEART CATH Left 06/14/2016   Procedure: Left Heart Cath;  Surgeon: Wellington Hampshire, MD;  Location: [place] CV LAB;  Service: Cardiovascular;  Laterality: Left;   LUMBAR DISC SURGERY  2004   injections; herniated disc; percutaneous discectomy   NASAL SINUS SURGERY  2008   percutaneous lumbar discectomy     ROTATOR CUFF REPAIR Right    TONSILLECTOMY       Current Meds  Medication Sig   acetaminophen (TYLENOL) 500 MG tablet Take 1,000 mg by mouth daily as needed for moderate pain.   aspirin EC 81 MG tablet Take 81 mg by mouth every evening.    atenolol (TENORMIN) 50 MG tablet TAKE 1 TABLET BY MOUTH  TWICE A DAY   COSENTYX SENSOREADY PEN 150 MG/ML SOAJ Inject 300 mg into the skin every 30 (thirty) days.    cyanocobalamin (,VITAMIN B-12,) 1000 MCG/ML injection Inject 1 mL into the muscle once a week for the next 3 weeks and then once every 30 days. (Patient taking differently: Inject 1,000 mcg into the muscle every 30 (thirty) days. )   Fe Fum-FePoly-Vit C-Vit B3 (INTEGRA) 62.5-62.5-40-3 MG CAPS Take 1 capsule by mouth daily. (Patient taking differently: Take 1 capsule by mouth every other day. )   FOLIC ACID PO Take 10 mcg by mouth every evening.    gabapentin (NEURONTIN) 300 MG capsule TAKE 1 CAPSULE BY MOUTH AT  BEDTIME AS NEEDED FOR PAIN   hydrochlorothiazide (HYDRODIURIL) 25 MG tablet TAKE 1 TABLET BY MOUTH  DAILY   losartan (COZAAR) 50 MG tablet TAKE 1 TABLET BY MOUTH  DAILY   metFORMIN (GLUCOPHAGE) 500 MG tablet Take 2 tablets (1,000 mg total) by mouth 2 (two) times daily with a meal.   methotrexate (RHEUMATREX) 2.5 MG tablet Take 2.5 mg by mouth once a week. Take 8 tablets every Friday.    Multiple Vitamin (MULTIVITAMIN) tablet Take 1 tablet by mouth daily.   nitroGLYCERIN (NITROSTAT) 0.4 MG SL tablet Place 1 tablet (0.4 mg total) under the tongue every 5 (five) minutes as needed. MAXIMUM OF 3 DOSES.   pantoprazole (PROTONIX) 40 MG tablet TAKE 1 TABLET BY MOUTH  TWICE A DAY   rosuvastatin (CRESTOR) 20 MG tablet TAKE 1 TABLET BY MOUTH EVERY DAY AT 6: 00 PM   SYRINGE-NEEDLE, DISP, 3 ML 25G X 5/8" 3 ML MISC Use as instructed with B12 injection.   vitamin E 400 UNIT capsule Take 400 Units by mouth every evening.     Allergies:   Ephedrine; Nsaids; Vancomycin;  Amoxicillin; Levsin [hyoscyamine sulfate]; Naprosyn [naproxen]; Other; Penicillins; Shellfish allergy; and Sudafed [pseudoephedrine hcl]   Social History   Tobacco Use   Smoking status: Former Smoker    Types: Cigarettes    Last attempt to quit: 07/06/1982    Years since quitting: 35.9   Smokeless tobacco: Never Used  Substance Use Topics   Alcohol use: Yes    Alcohol/week: 0.0 standard drinks    Comment: rarely   Drug use: No     Family Hx: [name] family history includes Aneurysm in his son; Cancer in his father; Hypertension in his mother; Lung cancer in his father.  ROS:   Please see the history of present illness.     All other systems reviewed and are negative.   Prior CV studies:   The following studies were reviewed today:  LHC 06/2016:  The left ventricular systolic function is normal.  LV end diastolic pressure is normal.  The left ventricular ejection fraction is 55-65% by visual estimate.  Prox RCA lesion, 60 %stenosed.  2nd RPLB lesion, 60 %stenosed.  1st Mrg lesion, 99 %stenosed.  Mid LAD lesion, 50 %stenosed.  Ost LM to LM lesion, 40 %stenosed.  Prox LAD lesion, 30 %stenosed.  Ost 1st Diag to 1st Diag lesion, 60 %stenosed.  Ost Cx lesion, 60 %stenosed.  A STENT RESOLUTE ONYX 2.5X15 drug eluting stent was successfully placed.  Dist RCA lesion, 95  %stenosed.  Post intervention, there is a 0% residual stenosis.   1. Significant 2 vessel coronary artery disease. The culprit for abnormal stress test is severe stenosis in the distal right coronary artery. There is subtotal occlusion of OM1 which is a small vessel less than 2 mm in diameter supplying a small area. There is 40% ostial left main stenosis and moderate LAD disease. There was no pressure dampening with engaging the left main. 2. Normal LV systolic function and normal left ventricular end-diastolic pressure. 3. Successful angioplasty and drug-eluting stent placement to the distal right coronary artery.  Recommendations: Dual antiplatelet therapy for at least 6 months. Aggressive treatment of residual coronary artery disease. I started the patient on rosuvastatin.  Labs/Other Tests and Data Reviewed:    EKG:  No ECG reviewed.  Recent Labs: 10/29/2017: Hemoglobin 13.8; Platelets 173.0 02/27/2018: ALT 20; BUN 9; Creatinine, Ser 0.89; Sodium 142; TSH 3.10 03/13/2018: Potassium 3.8   Recent Lipid Panel Lab Results  Component Value Date/Time   CHOL 104 02/27/2018 08:23 AM   TRIG 107.0 02/27/2018 08:23 AM   HDL 38.30 (L) 02/27/2018 08:23 AM   CHOLHDL 3 02/27/2018 08:23 AM   LDLCALC 45 02/27/2018 08:23 AM    Wt Readings from Last 3 Encounters:  06/23/18 186 lb (84.4 kg)  03/13/18 188 lb (85.3 kg)  02/13/18 179 lb (81.2 kg)     Objective:    Vital Signs:  BP 128/77 Comment: evening of 06/22/2018   Pulse 71    Ht 5' 8.5" (1.74 m)    Wt 186 lb (84.4 kg) Comment: taken on 06/22/2018   BMI 27.87 kg/m    VITAL SIGNS:  reviewed  ASSESSMENT & PLAN:    1. CAD involving the native coronary arteries without angina: He is doing well and is without any symptoms concerning for angina. Continue ASA, atenolol, and Crestor. Aggressive secondary prevention. No plans for ischemic evaluation at this time.   2. Carotid artery disease: Noted to have 95-18% LICA stenosis by ultrasound in  03/2017. Follow up carotid artery ultrasound is deferred until  03/2019 given insurance/finanical concerns and COVID-19. Continue optimal BP and lipid control.   3. HTN: Blood pressure is well controlled today. Continue current therapy.   4. HLD: LDL of 45 from 02/2018. Remains on Crestor.   COVID-19 Education: The signs and symptoms of COVID-19 were discussed with the patient and how to seek care for testing (follow up with PCP or arrange E-visit).  The importance of social distancing was discussed today.  Time:   Today, I have spent 13 minutes with the patient with telehealth technology discussing the above problems.     Medication Adjustments/Labs and Tests Ordered: Current medicines are reviewed at length with the patient today.  Concerns regarding medicines are outlined above.   Tests Ordered: No orders of the defined types were placed in this encounter.   Medication Changes: No orders of the defined types were placed in this encounter.   Disposition:  Follow up in 9 month(s)  Signed, Christell Faith, PA-C  06/23/2018 1:27 PM    [facility]

## 2018-06-23 ENCOUNTER — Telehealth (INDEPENDENT_AMBULATORY_CARE_PROVIDER_SITE_OTHER): Payer: Medicare Other | Admitting: Physician Assistant

## 2018-06-23 ENCOUNTER — Encounter: Payer: Self-pay | Admitting: Physician Assistant

## 2018-06-23 ENCOUNTER — Other Ambulatory Visit: Payer: Self-pay

## 2018-06-23 VITALS — BP 128/77 | HR 71 | Ht 68.5 in | Wt 186.0 lb

## 2018-06-23 DIAGNOSIS — I2510 Atherosclerotic heart disease of native coronary artery without angina pectoris: Secondary | ICD-10-CM

## 2018-06-23 DIAGNOSIS — I6522 Occlusion and stenosis of left carotid artery: Secondary | ICD-10-CM

## 2018-06-23 DIAGNOSIS — I10 Essential (primary) hypertension: Secondary | ICD-10-CM

## 2018-06-23 DIAGNOSIS — E785 Hyperlipidemia, unspecified: Secondary | ICD-10-CM

## 2018-06-23 NOTE — Patient Instructions (Signed)
It was a pleasure to speak with you on the phone today! Thank you for allowing [place] to continue taking care of your [facility] needs during this time.   Feel free to call as needed for questions and concerns related to your cardiac needs.   Medication Instructions:  Your physician recommends that you continue on your current medications as directed. Please refer to the Current Medication list given to you today.  If you need a refill on your cardiac medications before your next appointment, please call your pharmacy.   Lab work: None ordered If you have labs (blood work) drawn today and your tests are completely normal, you will receive your results only by: Marland Kitchen MyChart Message (if you have MyChart) OR . A paper copy in the mail If you have any lab test that is abnormal or we need to change your treatment, we will call you to review the results.  Testing/Procedures: Date intended 03/2019 Your physician has requested that you have a carotid duplex. This test is an ultrasound of the carotid arteries in your neck. It looks at blood flow through these arteries that supply the brain with blood. Allow one hour for this exam. There are no restrictions or special instructions.    Follow-Up: At [facility], you and your health needs are our priority.  As part of our continuing mission to provide you with exceptional heart care, we have created designated Provider Care Teams.  These Care Teams include your primary Cardiologist (physician) and Advanced Practice Providers (APPs -  Physician Assistants and Nurse Practitioners) who all work together to provide you with the care you need, when you need it. You will need a follow up appointment in 9 months.  Please call our office 2 months in advance to schedule this appointment.  You may see Kathlyn Sacramento, MD or Christell Faith, PA-C.

## 2018-07-09 DIAGNOSIS — M25512 Pain in left shoulder: Secondary | ICD-10-CM | POA: Diagnosis not present

## 2018-07-09 DIAGNOSIS — G8929 Other chronic pain: Secondary | ICD-10-CM | POA: Diagnosis not present

## 2018-07-09 DIAGNOSIS — M79642 Pain in left hand: Secondary | ICD-10-CM | POA: Diagnosis not present

## 2018-07-09 DIAGNOSIS — L409 Psoriasis, unspecified: Secondary | ICD-10-CM | POA: Diagnosis not present

## 2018-07-09 DIAGNOSIS — L4050 Arthropathic psoriasis, unspecified: Secondary | ICD-10-CM | POA: Diagnosis not present

## 2018-07-10 ENCOUNTER — Other Ambulatory Visit: Payer: Self-pay

## 2018-07-10 ENCOUNTER — Other Ambulatory Visit (INDEPENDENT_AMBULATORY_CARE_PROVIDER_SITE_OTHER): Payer: Medicare Other

## 2018-07-10 DIAGNOSIS — E0822 Diabetes mellitus due to underlying condition with diabetic chronic kidney disease: Secondary | ICD-10-CM | POA: Diagnosis not present

## 2018-07-10 DIAGNOSIS — N183 Chronic kidney disease, stage 3 unspecified: Secondary | ICD-10-CM

## 2018-07-10 DIAGNOSIS — E785 Hyperlipidemia, unspecified: Secondary | ICD-10-CM

## 2018-07-10 LAB — BASIC METABOLIC PANEL
BUN: 26 mg/dL — ABNORMAL HIGH (ref 6–23)
CO2: 29 mEq/L (ref 19–32)
Calcium: 9.5 mg/dL (ref 8.4–10.5)
Chloride: 101 mEq/L (ref 96–112)
Creatinine, Ser: 0.96 mg/dL (ref 0.40–1.50)
GFR: 77.49 mL/min (ref >60.00)
Glucose, Bld: 145 mg/dL — ABNORMAL HIGH (ref 70–99)
Potassium: 3.8 mEq/L (ref 3.5–5.1)
Sodium: 140 mEq/L (ref 135–145)

## 2018-07-10 LAB — LIPID PANEL
Cholesterol: 87 mg/dL (ref 0–200)
HDL: 31.3 mg/dL — ABNORMAL LOW (ref >39.00)
LDL Cholesterol: 20 mg/dL (ref 0–99)
NonHDL: 55.49
Total CHOL/HDL Ratio: 3
Triglycerides: 178 mg/dL — ABNORMAL HIGH (ref 0.0–149.0)
VLDL: 35.6 mg/dL (ref 0.0–40.0)

## 2018-07-10 LAB — HEPATIC FUNCTION PANEL
ALT: 18 U/L (ref 0–53)
AST: 15 U/L (ref 0–37)
Albumin: 4.4 g/dL (ref 3.5–5.2)
Alkaline Phosphatase: 40 U/L (ref 39–117)
Bilirubin, Direct: 0.1 mg/dL (ref 0.0–0.3)
Total Bilirubin: 0.4 mg/dL (ref 0.2–1.2)
Total Protein: 7 g/dL (ref 6.0–8.3)

## 2018-07-10 LAB — HEMOGLOBIN A1C: Hgb A1c MFr Bld: 6.2 % (ref 4.6–6.5)

## 2018-07-10 IMAGING — CR DG CHEST 2V
1 series · 2 of 2 positions shown · non-contrast
Comparison: 11/23/2015

CLINICAL DATA: Chest tightness.

EXAM:
CHEST  2 VIEW

[Series 1: dg chest 2 view · 0.14mm/px · 2 of 2 slices shown]
[im 1/2]
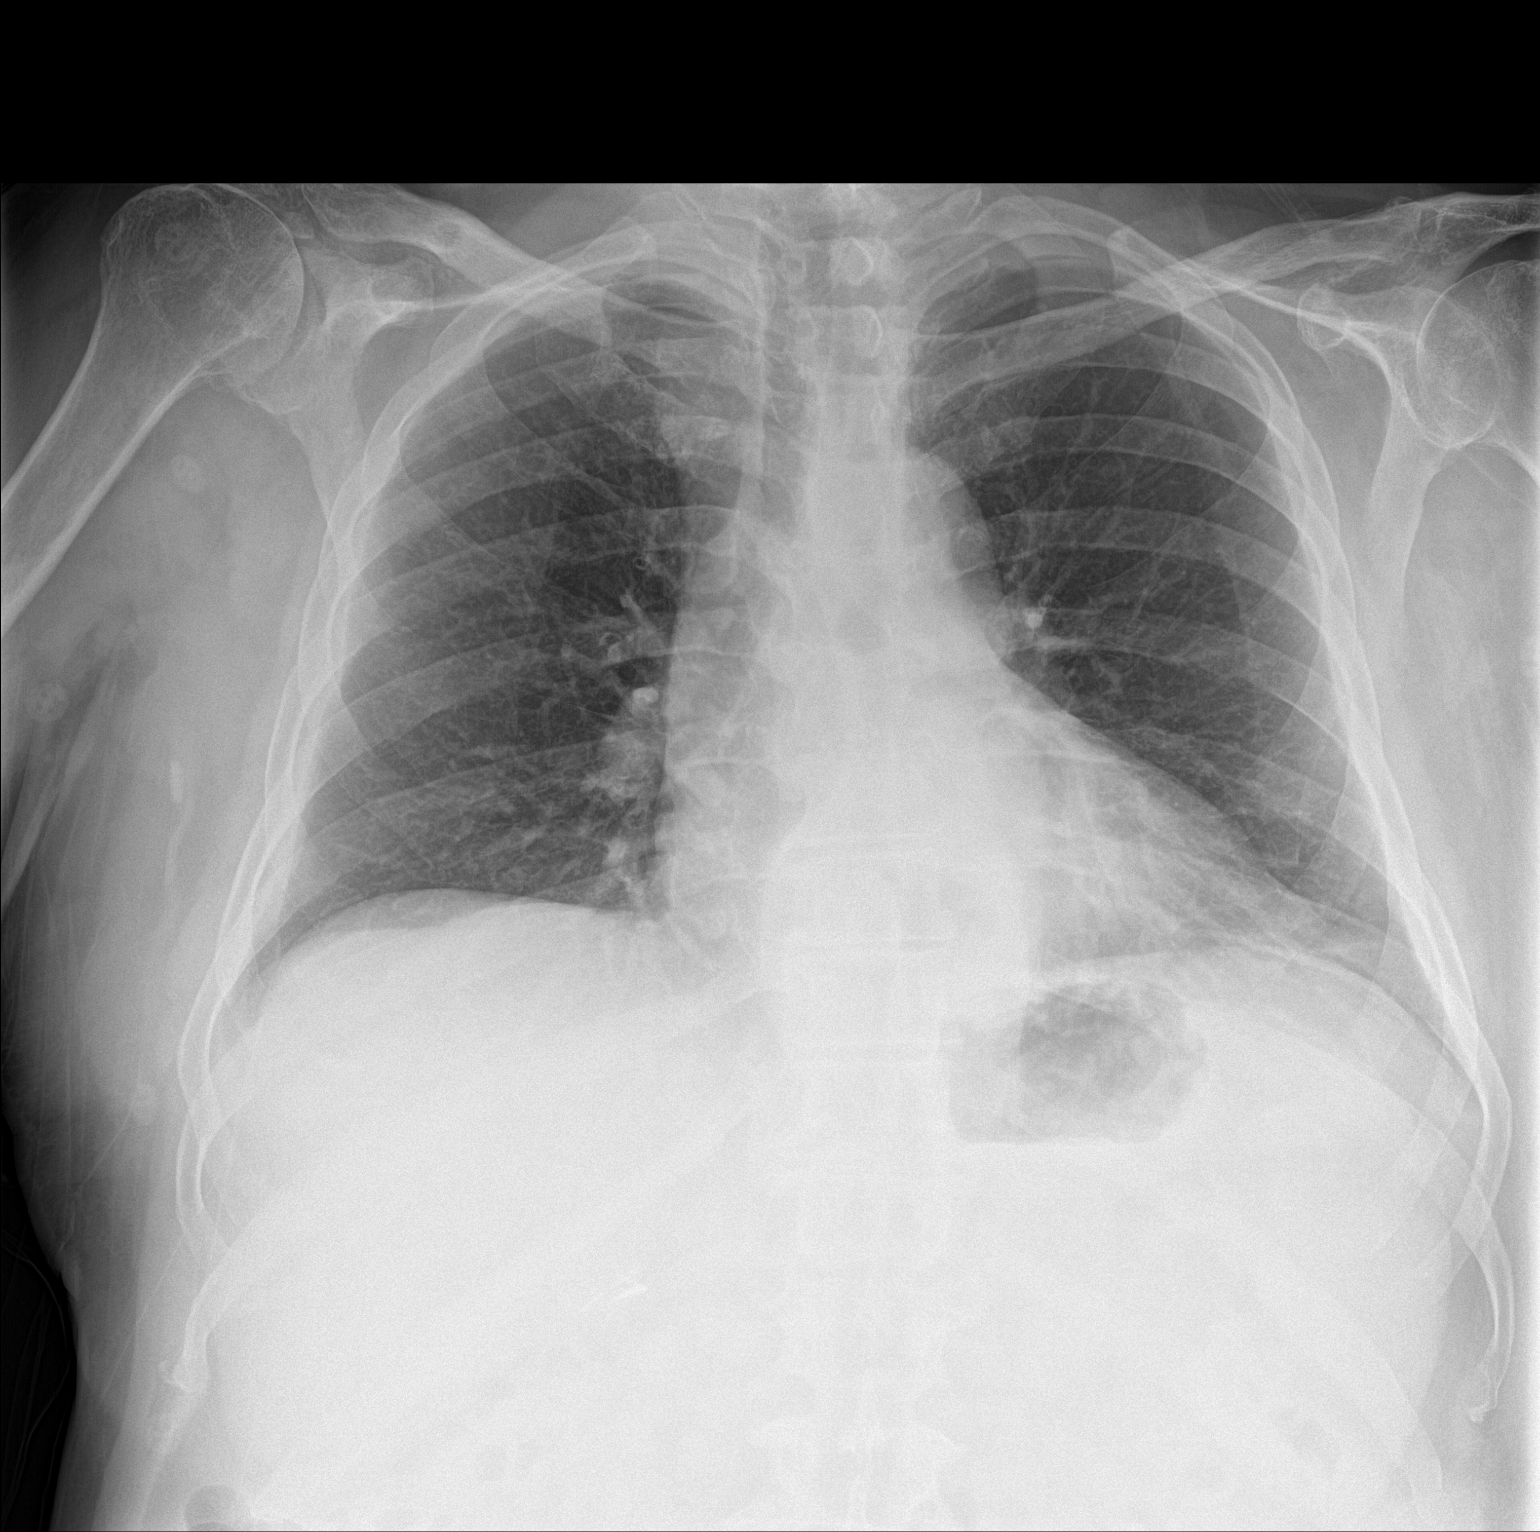
[im 2/2]
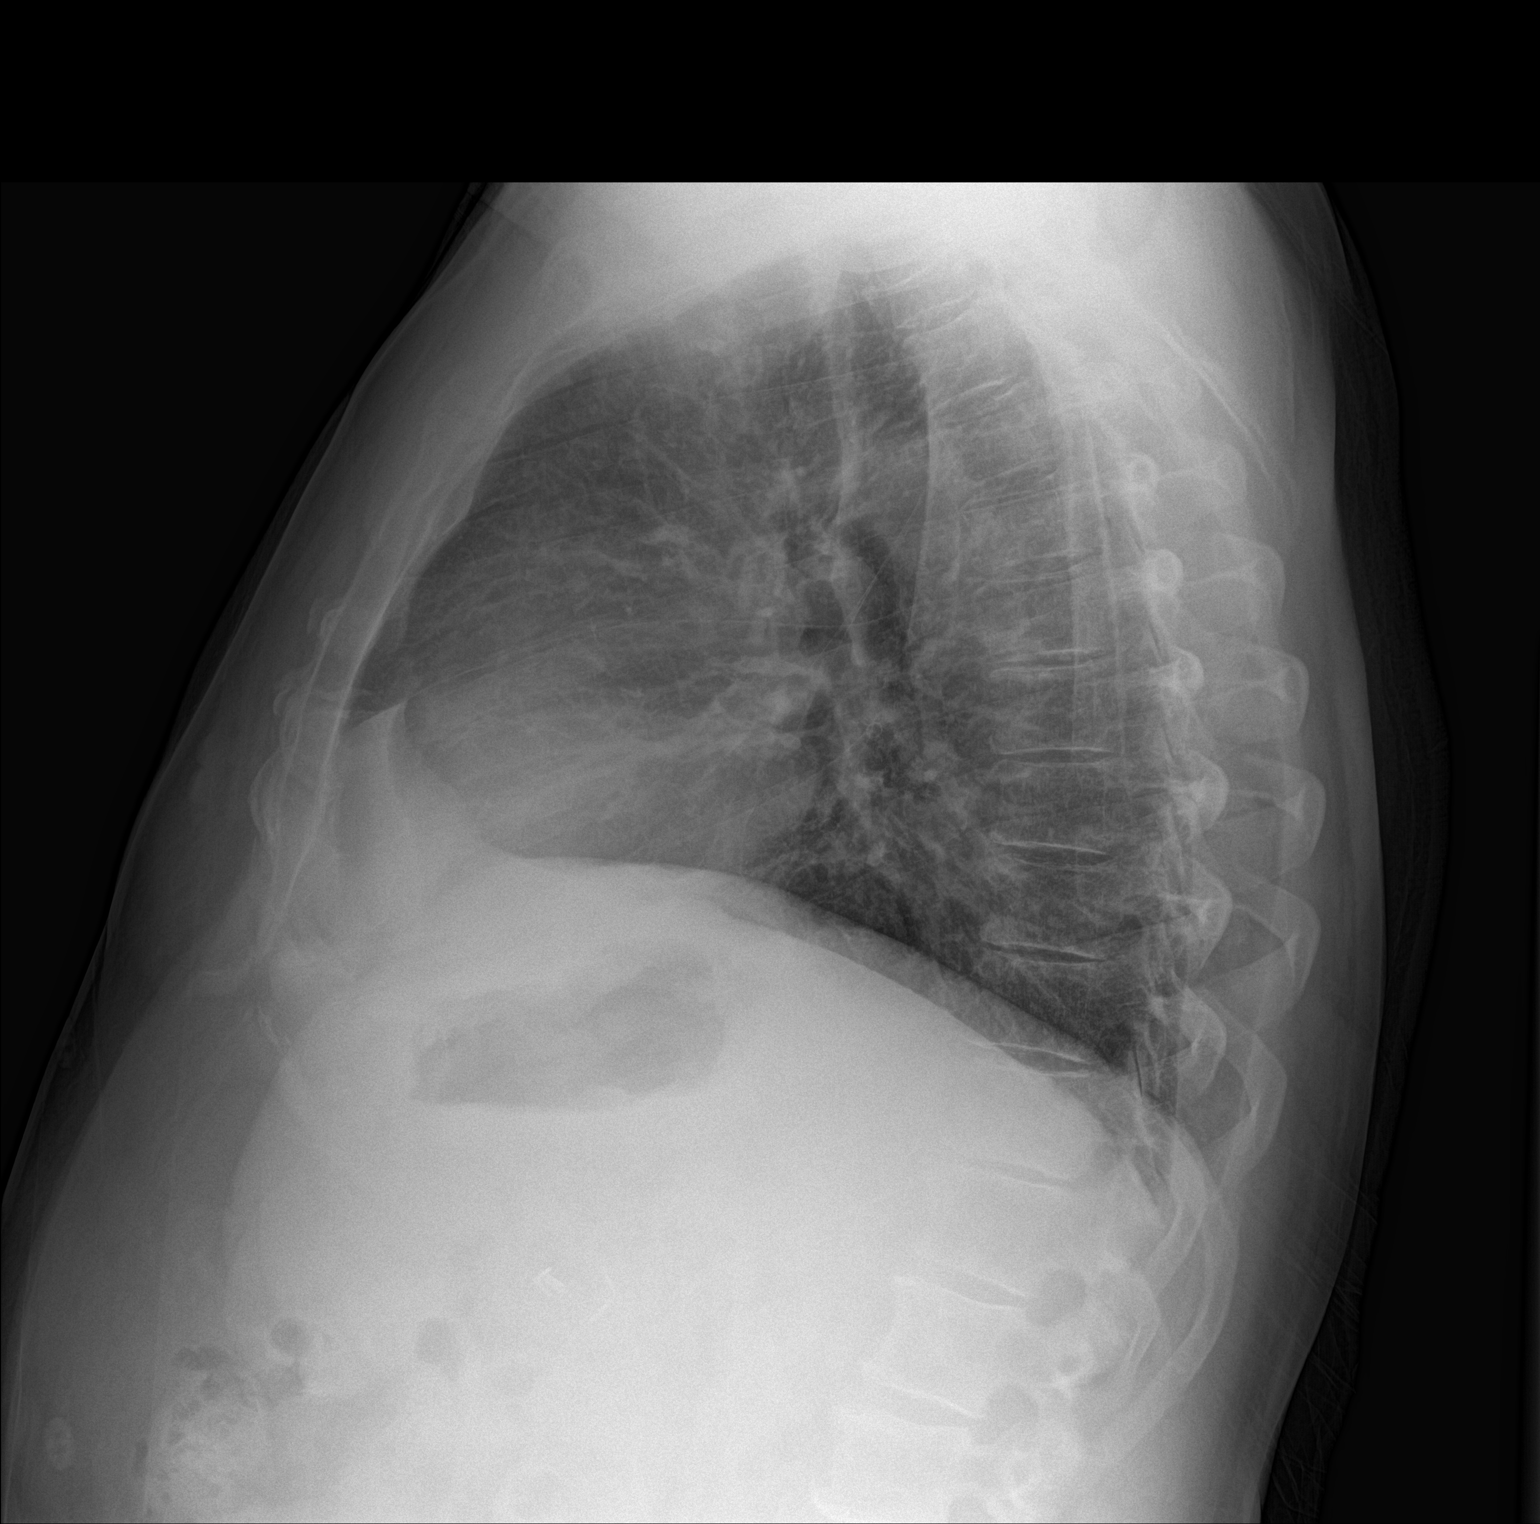

[2 of 2 positions shown; findings below may reference images not displayed]

FINDINGS: Heart and mediastinal contours are within normal limits. No focal
opacities or effusions. No acute bony abnormality. Small hiatal
hernia.
IMPRESSION: No active cardiopulmonary disease.

## 2018-07-11 IMAGING — DX DG CHEST 1V PORT
1 series · 1 of 1 positions shown · non-contrast
Comparison: CT scan of the chest December 03, 2015 and chest
x-ray of the same date

CLINICAL DATA: Respiratory failure, history of diabetes,
gastroesophageal reflux, remote history of smoking.

EXAM:
PORTABLE CHEST 1 VIEW

[chest ap]
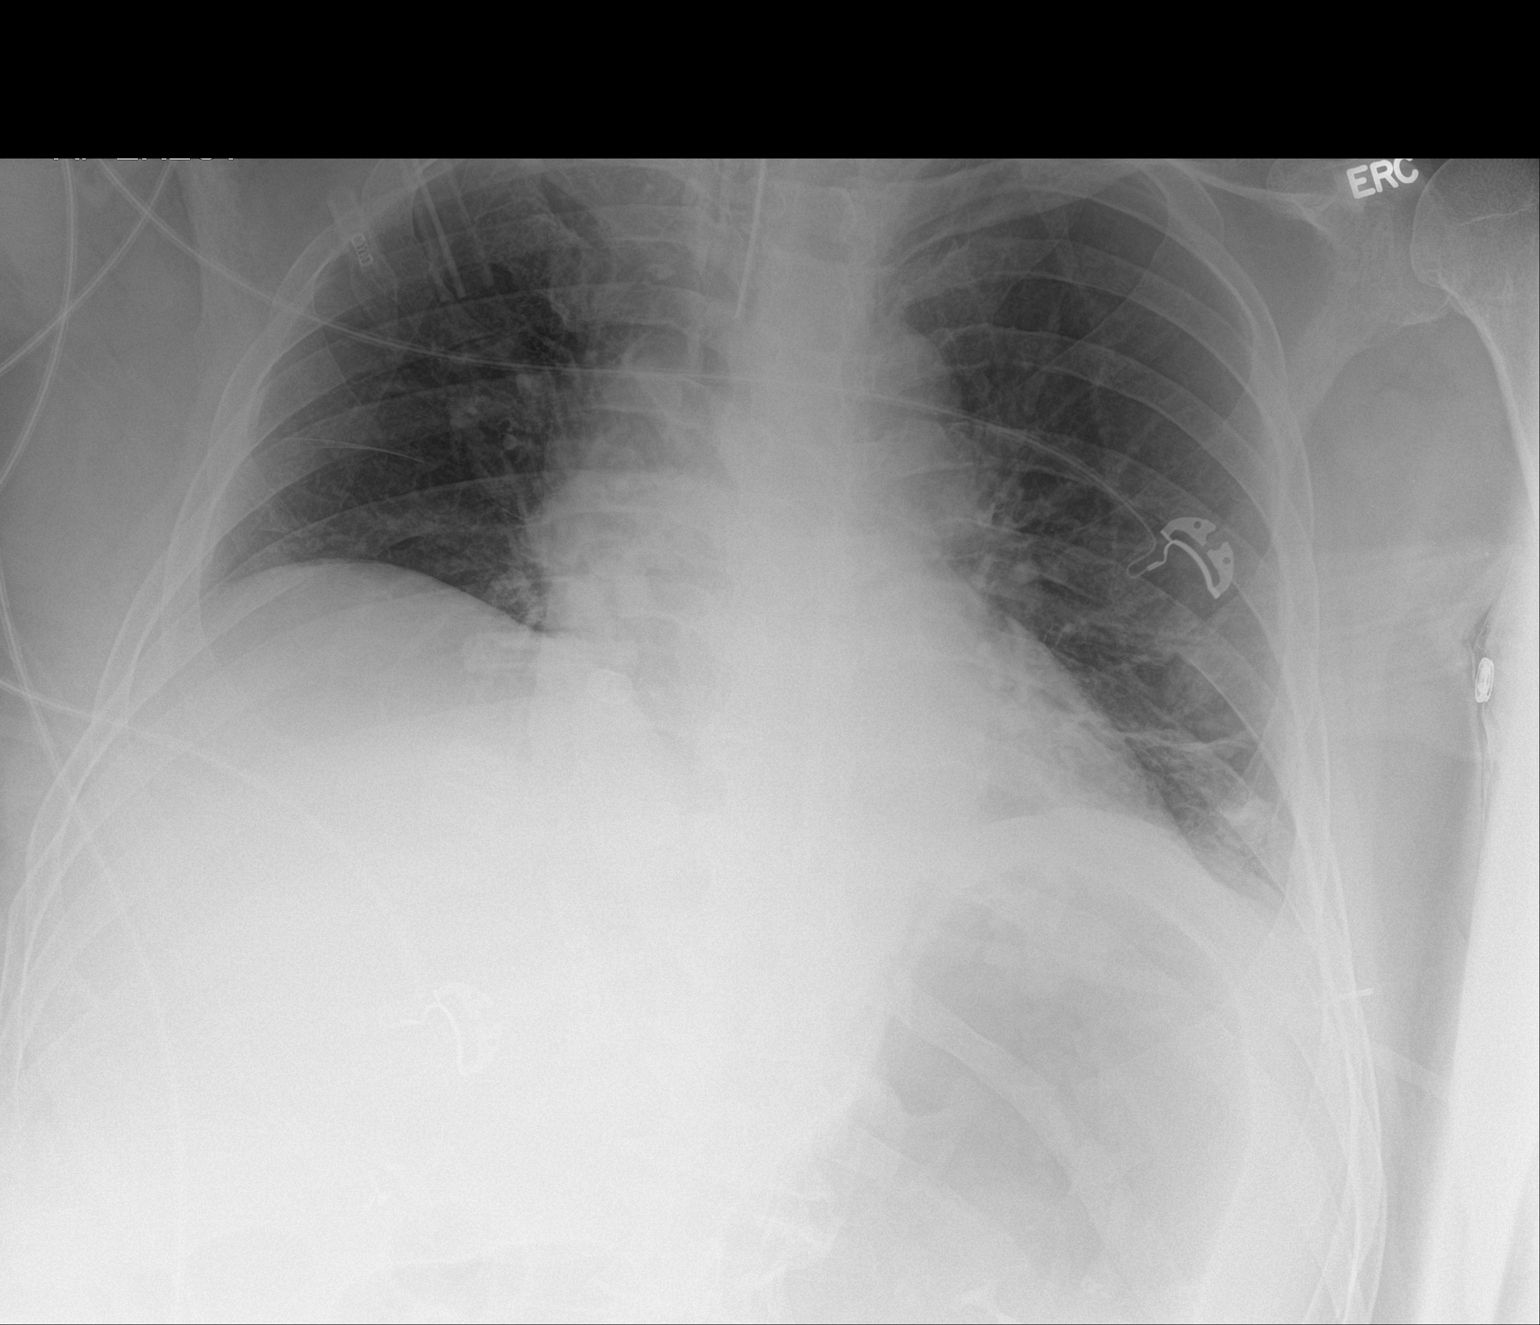

[1 of 1 positions shown; findings below may reference images not displayed]

FINDINGS: The right hemidiaphragm is higher than the left. There remains
gaseous distention of the stomach. There is patchy density at the
left lung base consistent with atelectasis. There is no pleural
effusion. The cardiac silhouette is enlarged. The pulmonary
vascularity is not engorged. The endotracheal tube tip lies 3.7 cm
above the carina. There is mild tortuosity of the descending
thoracic aorta.
IMPRESSION: Bilateral hypo inflation. Left lower lobe atelectasis or early
pneumonia. Cardiomegaly without pulmonary edema. The support tubes
are in reasonable position. No significant mediastinal widening
allowing for the hypo inflation.

Persistent gaseous distention of the stomach.

## 2018-07-11 IMAGING — DX DG CHEST 1V PORT
1 series · 1 of 1 positions shown · non-contrast
Comparison: 12/04/2015 .

CLINICAL DATA: Central line placement.

EXAM:
PORTABLE CHEST 1 VIEW

[chest ap]
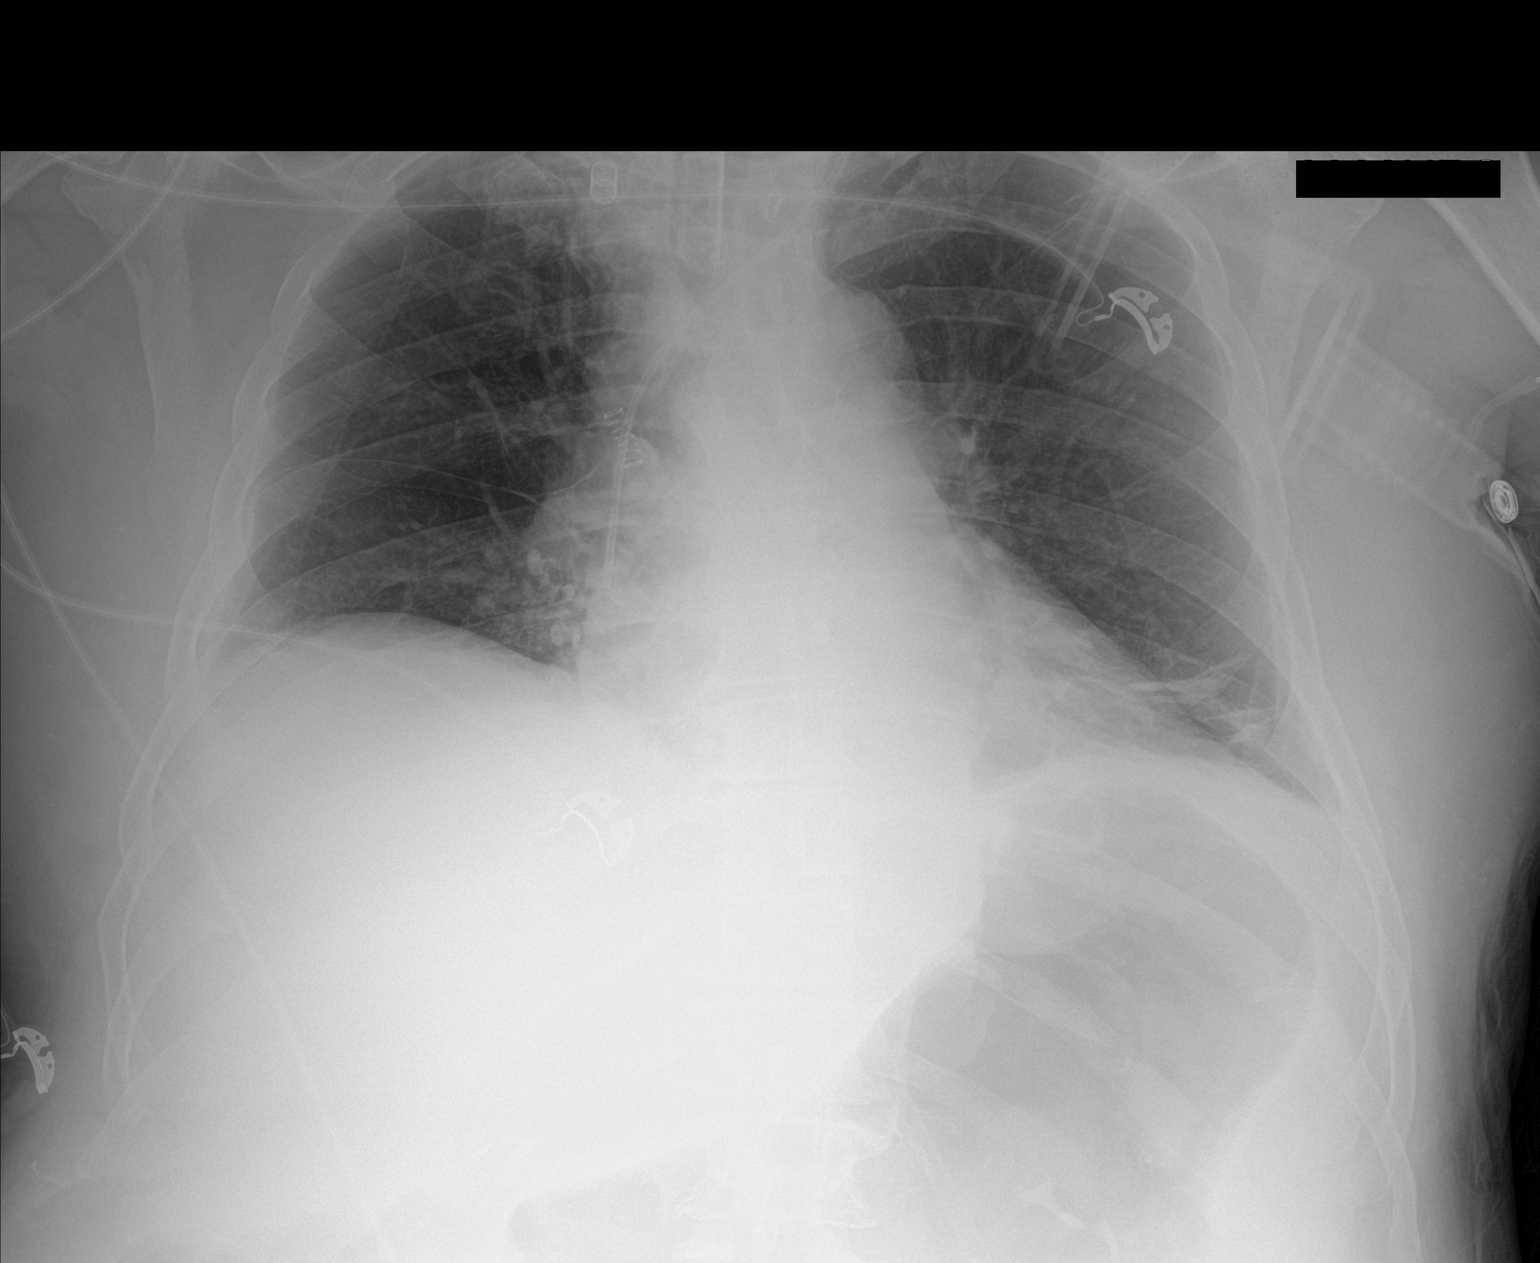

[1 of 1 positions shown; findings below may reference images not displayed]

FINDINGS: Endotracheal tube, left IJ line in stable position. Cardiomegaly
with normal pulmonary vascularity. Low lung volumes with mild
bibasilar atelectasis. No pleural effusion or pneumothorax.
IMPRESSION: 1. Lines and tubes in stable position.

2. Low lung volumes with mild bibasilar atelectasis.

## 2018-07-12 IMAGING — DX DG CHEST 1V
1 series · 1 of 1 positions shown · non-contrast
Comparison: 12/04/2015

CLINICAL DATA: Dyspnea.

EXAM:
CHEST 1 VIEW

[chest ap]
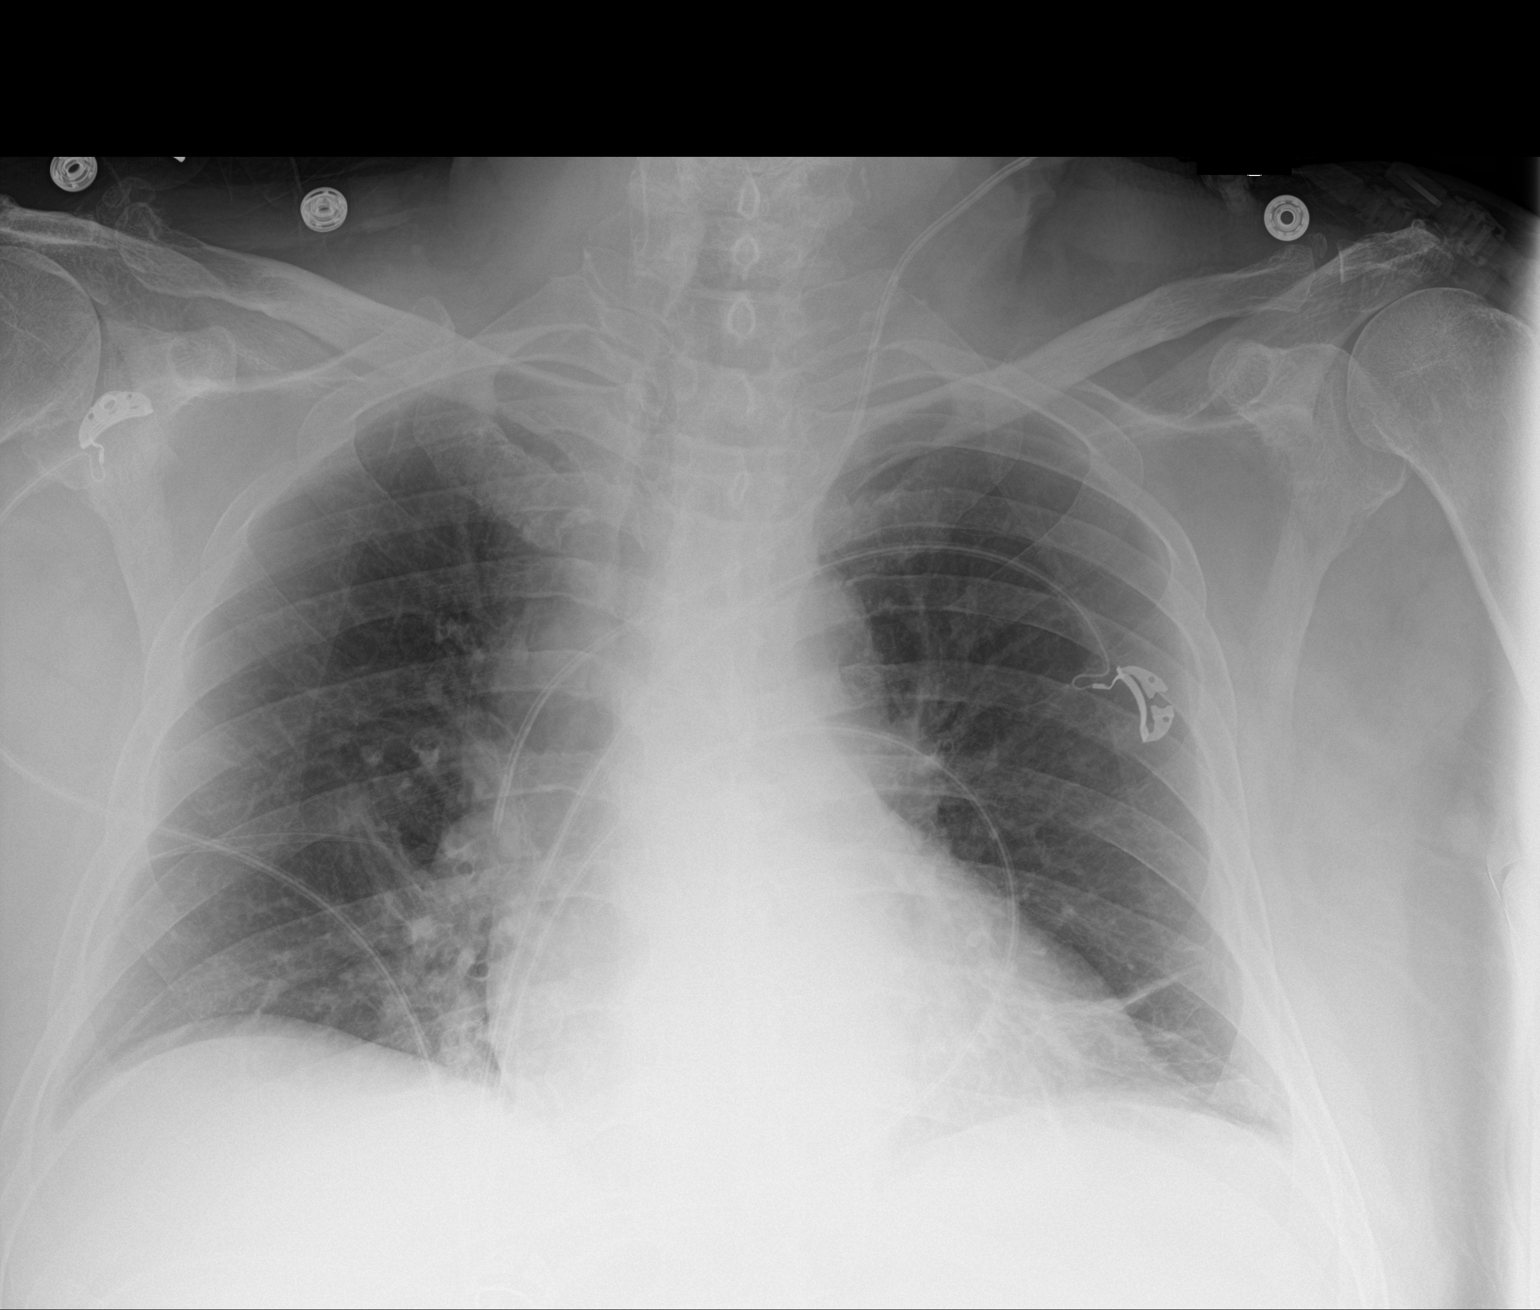

[1 of 1 positions shown; findings below may reference images not displayed]

FINDINGS: Endotracheal tube has been removed. Left jugular catheter remains in
place and terminates over the lower SVC. In the cardiac silhouette
is upper limits of normal to mildly enlarged, unchanged. Mild left
greater than right basilar opacities are unchanged and compatible
with atelectasis. There is no evidence of new airspace
consolidation, overt pulmonary edema, sizable pleural effusion, or
pneumothorax.
IMPRESSION: Interval extubation.  Unchanged bibasilar atelectasis.

## 2018-07-24 ENCOUNTER — Other Ambulatory Visit: Payer: Self-pay

## 2018-07-24 ENCOUNTER — Encounter: Payer: Self-pay | Admitting: Internal Medicine

## 2018-07-24 ENCOUNTER — Ambulatory Visit (INDEPENDENT_AMBULATORY_CARE_PROVIDER_SITE_OTHER): Payer: Medicare Other | Admitting: Internal Medicine

## 2018-07-24 DIAGNOSIS — R1310 Dysphagia, unspecified: Secondary | ICD-10-CM

## 2018-07-24 DIAGNOSIS — D649 Anemia, unspecified: Secondary | ICD-10-CM

## 2018-07-24 DIAGNOSIS — R945 Abnormal results of liver function studies: Secondary | ICD-10-CM | POA: Diagnosis not present

## 2018-07-24 DIAGNOSIS — K219 Gastro-esophageal reflux disease without esophagitis: Secondary | ICD-10-CM

## 2018-07-24 DIAGNOSIS — E785 Hyperlipidemia, unspecified: Secondary | ICD-10-CM

## 2018-07-24 DIAGNOSIS — R7989 Other specified abnormal findings of blood chemistry: Secondary | ICD-10-CM

## 2018-07-24 DIAGNOSIS — N183 Chronic kidney disease, stage 3 unspecified: Secondary | ICD-10-CM

## 2018-07-24 DIAGNOSIS — I2510 Atherosclerotic heart disease of native coronary artery without angina pectoris: Secondary | ICD-10-CM | POA: Diagnosis not present

## 2018-07-24 DIAGNOSIS — E0822 Diabetes mellitus due to underlying condition with diabetic chronic kidney disease: Secondary | ICD-10-CM

## 2018-07-24 DIAGNOSIS — R1319 Other dysphagia: Secondary | ICD-10-CM

## 2018-07-24 DIAGNOSIS — L4050 Arthropathic psoriasis, unspecified: Secondary | ICD-10-CM

## 2018-07-24 DIAGNOSIS — I10 Essential (primary) hypertension: Secondary | ICD-10-CM

## 2018-07-24 MED ORDER — GABAPENTIN 300 MG PO CAPS: ORAL_CAPSULE | ORAL | 1 refills | Status: DC

## 2018-07-24 MED ORDER — ROSUVASTATIN CALCIUM 10 MG PO TABS: 10.0000 mg | ORAL_TABLET | Freq: Every day | ORAL | 0 refills | Status: DC

## 2018-07-24 NOTE — Progress Notes (Signed)
Patient ID: Shane Mcknight, male   DOB: [birth year], 68 y.o.   MRN: [id number]   Virtual Visit via telephone Note  This visit type was conducted due to national recommendations for restrictions regarding the COVID-19 pandemic (e.g. social distancing).  This format is felt to be most appropriate for this patient at this time.  All issues noted in this document were discussed and addressed.  No physical exam was performed (except for noted visual exam findings with Video Visits).   I connected with Hicks Beshears by telephone and verified that I am speaking with the correct person using two identifiers. Location patient: home Location provider: work Persons participating in the telephone visit: patient, provider  I discussed the limitations, risks, security and privacy concerns of performing an evaluation and management service by telephone and the availability of in person appointments. The patient expressed understanding and agreed to proceed.   Reason for visit: scheduled follow up.   HPI: He reports he is doing well.  Planning to go back to work next week.  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  Plans to wear mask.  Trying to stay active.  No chest pain.  No sob.  No acid reflux.  Sees Dr Jefm Bryant for f/u psoriatic arthritis.  Last evaluated 07/09/18.  Left shoulder injection.  Still some issues with his hands.  Saw cardiology 06/23/18.  Stable.  States his blood pressure has been doing well.  Blood pressures averaging 130s/60-70s.  Discussed recent labs.  Decreased crestor to 62m q day.  Has hiatal hernia.  Reports has to watch how much he eats.  Does not eat large meals.  Discussed further evaluation.  He declines.  Wants to monitor.     ROS: See pertinent positives and negatives per HPI.  Past Medical History:  Diagnosis Date  . Abnormal liver function tests   . Allergic state   . Anemia   . Arthritis    spine, hands, shoulder with previous cuff tear  . CAD (coronary  artery disease)    a.  11/2015 CT Chest/Abd: Coronray Ca2+ noted;  b. MV: EF 45-54%, med defect of mod severity in basal infsept, basal inf, mid infsept, and mid inf region-> infarct and peri-infarct ischemia-->intermediate risk; c. 05/2016 Cath/PCI: LM 40ost, LAD 30p, 585mD1 60ost, LCX 60ost, OM1 99 (small->Med Rx), RCA 60p, 95d (2.5x15 Resolute Onyx DES), RPDA/RPLB min irregs, D2 60, EF 55-65%/  . Candidiasis of esophagus ([facility]  . Colon polyp   . DDD (degenerative disc disease), lumbosacral   . Dysphagia   . Elevated transaminase level   . Esophageal stricture    a. s/p multiple dilations - last 03/2016.  . Marland Kitchensophagitis, [place] grade D   . Ganglion cyst of finger of right hand   . GERD (gastroesophageal reflux disease)   . Guaiac positive stools   . Hiatal hernia   . HNP (herniated nucleus pulposus), lumbar   . Hypercholesterolemia   . Hypertension   . Lumbar radiculitis   . Mallory-Weiss tear    a. 11/2015.  . Migraine aura without headache    migraine, visual  . Psoriasis    with psoriatic arthritis  . Psoriatic arthritis (HCC)    severe  . Schatzki's ring   . Sleep apnea    does not uses CPAP. has lost weight so is much better.  . Type II diabetes mellitus ([facility]    Past Surgical History:  Procedure Laterality Date  .  ARTHROPLASTY     right thumb; left thumb with 2 screws and plastic joint  . BALLOON DILATION N/A 05/29/2015   Procedure: BALLOON DILATION;  Surgeon: Lollie Sails, MD;  Location: [facility] ENDOSCOPY;  Service: Endoscopy;  Laterality: N/A;  . BALLOON DILATION N/A 02/14/2017   Procedure: BALLOON DILATION;  Surgeon: Lollie Sails, MD;  Location: [facility] ENDOSCOPY;  Service: Endoscopy;  Laterality: N/A;  . BILATERAL CARPAL TUNNEL RELEASE Bilateral 07/04/2017   Procedure: BILATERAL CARPAL TUNNEL RELEASE;  Surgeon: Leanor Kail, MD;  Location: [facility] ORS;  Service: Orthopedics;  Laterality: Bilateral;  . BLEPHAROPLASTY    . CARDIAC CATHETERIZATION     1  stent   . CARPAL TUNNEL RELEASE  2005   right  . CHOLECYSTECTOMY  2008  . COLONOSCOPY    . COLONOSCOPY WITH PROPOFOL N/A 02/13/2018   Procedure: COLONOSCOPY WITH PROPOFOL;  Surgeon: Lollie Sails, MD;  Location: [facility] ENDOSCOPY;  Service: Endoscopy;  Laterality: N/A;  . CORONARY STENT INTERVENTION N/A 06/14/2016   Procedure: Coronary Stent Intervention;  Surgeon: Wellington Hampshire, MD;  Location: [place] CV LAB;  Service: Cardiovascular;  Laterality: N/A;  . CYST EXCISION  04/14/2015   tendon sheath cyst excision; right ring finger cyst removed and trigger finger realease  . ESOPHAGOGASTRODUODENOSCOPY    . ESOPHAGOGASTRODUODENOSCOPY (EGD) WITH PROPOFOL N/A 05/29/2015   Procedure: ESOPHAGOGASTRODUODENOSCOPY (EGD) WITH PROPOFOL;  Surgeon: Lollie Sails, MD;  Location: [facility] ENDOSCOPY;  Service: Endoscopy;  Laterality: N/A;  . ESOPHAGOGASTRODUODENOSCOPY (EGD) WITH PROPOFOL N/A 07/04/2015   Procedure: ESOPHAGOGASTRODUODENOSCOPY (EGD) WITH PROPOFOL;  Surgeon: Lollie Sails, MD;  Location: [facility] ENDOSCOPY;  Service: Endoscopy;  Laterality: N/A;  . ESOPHAGOGASTRODUODENOSCOPY (EGD) WITH PROPOFOL N/A 01/04/2016   Procedure: ESOPHAGOGASTRODUODENOSCOPY (EGD) WITH PROPOFOL;  Surgeon: Lollie Sails, MD;  Location: [facility] ENDOSCOPY;  Service: Endoscopy;  Laterality: N/A;  . ESOPHAGOGASTRODUODENOSCOPY (EGD) WITH PROPOFOL N/A 03/22/2016   Procedure: ESOPHAGOGASTRODUODENOSCOPY (EGD) WITH PROPOFOL;  Surgeon: Lollie Sails, MD;  Location: [facility] ENDOSCOPY;  Service: Endoscopy;  Laterality: N/A;  . ESOPHAGOGASTRODUODENOSCOPY (EGD) WITH PROPOFOL N/A 02/14/2017   Procedure: ESOPHAGOGASTRODUODENOSCOPY (EGD) WITH PROPOFOL;  Surgeon: Lollie Sails, MD;  Location: [facility] ENDOSCOPY;  Service: Endoscopy;  Laterality: N/A;  . ESOPHAGOGASTRODUODENOSCOPY (EGD) WITH PROPOFOL N/A 04/11/2017   Procedure: ESOPHAGOGASTRODUODENOSCOPY (EGD) WITH PROPOFOL;  Surgeon: Lollie Sails, MD;  Location: [facility] ENDOSCOPY;   Service: Endoscopy;  Laterality: N/A;  . ESOPHAGOGASTRODUODENOSCOPY (EGD) WITH PROPOFOL N/A 07/07/2017   Procedure: ESOPHAGOGASTRODUODENOSCOPY (EGD) WITH PROPOFOL;  Surgeon: Lucilla Lame, MD;  Location: [facility] ENDOSCOPY;  Service: Endoscopy;  Laterality: N/A;  . ESOPHAGOGASTRODUODENOSCOPY (EGD) WITH PROPOFOL N/A 02/13/2018   Procedure: ESOPHAGOGASTRODUODENOSCOPY (EGD) WITH PROPOFOL;  Surgeon: Lollie Sails, MD;  Location: [facility] ENDOSCOPY;  Service: Endoscopy;  Laterality: N/A;  . EYE SURGERY    . FOREIGN BODY REMOVAL N/A 12/03/2015   Procedure: FOREIGN BODY REMOVAL;  Surgeon: Wilford Corner, MD;  Location: [facility] ENDOSCOPY;  Service: Endoscopy;  Laterality: N/A;  . HERNIA REPAIR Right 1970   inguinal  . INGUINAL HERNIA REPAIR Left 01/01/2018   Procedure: OPEN HERNIA REPAIR INGUINAL ADULT;  Surgeon: Olean Ree, MD;  Location: [facility] ORS;  Service: General;  Laterality: Left;  . LEFT HEART CATH Left 06/14/2016   Procedure: Left Heart Cath;  Surgeon: Wellington Hampshire, MD;  Location: [place] CV LAB;  Service: Cardiovascular;  Laterality: Left;  . LUMBAR DISC SURGERY  2004   injections; herniated disc; percutaneous discectomy  . NASAL SINUS SURGERY  2008  . percutaneous lumbar discectomy    .  ROTATOR CUFF REPAIR Right   . TONSILLECTOMY      Family History  Problem Relation Age of Onset  . Lung cancer Father   . Cancer Father   . Hypertension Mother   . Aneurysm Son     SOCIAL HX: reviewed.    Current Outpatient Medications:  .  acetaminophen (TYLENOL) 500 MG tablet, Take 1,000 mg by mouth daily as needed for moderate pain., Disp: , Rfl:  .  aspirin EC 81 MG tablet, Take 81 mg by mouth every evening. , Disp: , Rfl:  .  atenolol (TENORMIN) 50 MG tablet, TAKE 1 TABLET BY MOUTH  TWICE A DAY, Disp: 180 tablet, Rfl: 1 .  COSENTYX SENSOREADY PEN 150 MG/ML SOAJ, Inject 300 mg into the skin every 30 (thirty) days. , Disp: , Rfl: 10 .  cyanocobalamin (,VITAMIN B-12,) 1000 MCG/ML  injection, Inject 1,000 mcg into the muscle every 30 (thirty) days., Disp: , Rfl:  .  Fe Fum-FePoly-Vit C-Vit B3 (INTEGRA) 62.5-62.5-40-3 MG CAPS, Take 1 capsule by mouth daily. (Patient taking differently: Take 1 capsule by mouth every other day. ), Disp: 90 capsule, Rfl: 1 .  FOLIC ACID PO, Take 10 mcg by mouth every evening. , Disp: , Rfl:  .  gabapentin (NEURONTIN) 300 MG capsule, Take 1-2 capsules q hs, Disp: 180 capsule, Rfl: 1 .  hydrochlorothiazide (HYDRODIURIL) 25 MG tablet, TAKE 1 TABLET BY MOUTH  DAILY, Disp: 90 tablet, Rfl: 1 .  losartan (COZAAR) 50 MG tablet, TAKE 1 TABLET BY MOUTH  DAILY, Disp: 90 tablet, Rfl: 1 .  metFORMIN (GLUCOPHAGE) 500 MG tablet, Take 2 tablets (1,000 mg total) by mouth 2 (two) times daily with a meal., Disp: 360 tablet, Rfl: 1 .  methotrexate (RHEUMATREX) 2.5 MG tablet, Take 2.5 mg by mouth once a week. Take 8 tablets every Friday., Disp: , Rfl:  .  Multiple Vitamin (MULTIVITAMIN) tablet, Take 1 tablet by mouth daily., Disp: , Rfl:  .  nitroGLYCERIN (NITROSTAT) 0.4 MG SL tablet, Place 1 tablet (0.4 mg total) under the tongue every 5 (five) minutes as needed. MAXIMUM OF 3 DOSES., Disp: 25 tablet, Rfl: 6 .  pantoprazole (PROTONIX) 40 MG tablet, TAKE 1 TABLET BY MOUTH  TWICE A DAY, Disp: 180 tablet, Rfl: 1 .  rosuvastatin (CRESTOR) 10 MG tablet, Take 1 tablet (10 mg total) by mouth daily., Disp: 90 tablet, Rfl: 0 .  SYRINGE-NEEDLE, DISP, 3 ML 25G X 5/8" 3 ML MISC, Use as instructed with B12 injection., Disp: 50 each, Rfl: 11 .  triamcinolone cream (KENALOG) 0.5 %, Apply 1 application topically 2 (two) times daily as needed (scaling)., Disp: , Rfl:  .  vitamin E 400 UNIT capsule, Take 400 Units by mouth every evening., Disp: , Rfl:   EXAM:  VITALS per patient if applicable: 409/81, 66  GENERAL: alert.  Sounds to be in no acute distress.  Answering questions appropriately.    PSYCH/NEURO: pleasant and cooperative, no obvious depression or anxiety, speech and  thought processing grossly intact  ASSESSMENT AND PLAN:  Discussed the following assessment and plan:  Abnormal liver function tests Follow liver function tests.    Anemia Follow cbc. Has been evaluated by GI.   CAD (coronary artery disease) S/p stent RCA. Followed by cardiology.  Continue risk factor modification.     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  Follow met b and a1c.  Dysphagia States has hiatal hernia as outlined.  Discussed further evaluation and w/up.  He declines.  Follow.    GERD (gastroesophageal reflux disease) Controlled on protonix.  Follow.    Hyperlipidemia On crestor.  Low cholesterol dieta and exercise. Follow lipid panel and liver function tests.    Hypertension Blood pressure doing well.  Continue current medication regimen.  Follow pressures.  Follow metabolic panel.    Psoriatic arthritis Followed by Dr Jefm Bryant.  Stable.  Continue current medications.      I discussed the assessment and treatment plan with the patient. The patient was provided an opportunity to ask questions and all were answered. The patient agreed with the plan and demonstrated an understanding of the instructions.   The patient was advised to call back or seek an in-person evaluation if the symptoms worsen or if the condition fails to improve as anticipated.  I provided 22 minutes of non-face-to-face time during this encounter.   Einar Pheasant, MD

## 2018-07-27 ENCOUNTER — Encounter: Payer: Self-pay | Admitting: Internal Medicine

## 2018-07-27 NOTE — Assessment & Plan Note (Signed)
On crestor.  Low cholesterol dieta and exercise. Follow lipid panel and liver function tests.

## 2018-07-27 NOTE — Assessment & Plan Note (Signed)
Blood pressure doing well.  Continue current medication regimen.  Follow pressures.  Follow metabolic panel.  

## 2018-07-27 NOTE — Assessment & Plan Note (Signed)
States has hiatal hernia as outlined.  Discussed further evaluation and w/up.  He declines.  Follow.

## 2018-07-27 NOTE — Assessment & Plan Note (Signed)
Low carb diet and exercise.  Follow met b and a1c.  

## 2018-07-27 NOTE — Assessment & Plan Note (Signed)
Follow liver function tests.   

## 2018-07-27 NOTE — Assessment & Plan Note (Signed)
Follow cbc. Has been evaluated by GI.

## 2018-07-27 NOTE — Assessment & Plan Note (Signed)
Followed by Dr Jefm Bryant.  Stable.  Continue current medications.

## 2018-07-27 NOTE — Assessment & Plan Note (Signed)
Controlled on protonix.  Follow.   

## 2018-07-27 NOTE — Assessment & Plan Note (Signed)
S/p stent RCA. Followed by cardiology.  Continue risk factor modification.

## 2018-07-31 ENCOUNTER — Other Ambulatory Visit: Payer: Self-pay | Admitting: Internal Medicine

## 2018-08-26 DIAGNOSIS — E119 Type 2 diabetes mellitus without complications: Secondary | ICD-10-CM | POA: Diagnosis not present

## 2018-08-26 LAB — HM DIABETES EYE EXAM

## 2018-08-27 DIAGNOSIS — G8929 Other chronic pain: Secondary | ICD-10-CM | POA: Diagnosis not present

## 2018-08-27 DIAGNOSIS — M25512 Pain in left shoulder: Secondary | ICD-10-CM | POA: Diagnosis not present

## 2018-08-31 ENCOUNTER — Other Ambulatory Visit: Payer: Self-pay | Admitting: Internal Medicine

## 2018-09-01 NOTE — Telephone Encounter (Signed)
This is a historical medication in his med list. Is it okay to refill?

## 2018-09-01 NOTE — Telephone Encounter (Signed)
rx sent in for B12 1061mcg q month 3ml with one refill.

## 2018-09-08 ENCOUNTER — Telehealth: Payer: Self-pay | Admitting: Cardiovascular Disease

## 2018-09-08 NOTE — Telephone Encounter (Signed)
Spoke with the patient. Pt sts that the has been having episodes of chest tightness associated with activity that started late last week. Pt sts that the episodes are short in duration and relieved with rest. Pt sts that over the weekend he and his son were installing his back door. He developed an episode of chest tigtness associated with dizziness. Pt denies n/v, diaphoresis,sob. Swelling, palpitations. The episode was relieved with 1 Nitro. Pt sts that he continued working but would have to take breaks frequently to rest. Pt has not has any episodes of chest tightness today. Adv the pt that I would recommend scheduling an appt for evaluation.   Appt scheduled with Ignacia Bayley, NP on 09/10/18 @ 2:30pm. Pt adv that he should arrive 30-40 min prior, enter through the medical mall, and wear a face mask. Pt adv to use Nitro prn as directed for chest tightness. Pt adv to seek emergent care for chest pain not relieved by Nitro. Pt has asked to be called if an earlier appt becomes available.  COVID screening done.      COVID-19 Pre-Screening Questions:  . In the past 7 to 10 days have you had a cough,  shortness of breath, headache, congestion, fever (100 or greater) body aches, chills, sore throat, or sudden loss of taste or sense of smell? No . Have you been around anyone with known Covid 19. No . Have you been around anyone who is awaiting Covid 19 test results in the past 7 to 10 days? No . Have you been around anyone who has been exposed to Covid 19, or has mentioned symptoms of Covid 19 within the past 7 to 10 days? No

## 2018-09-08 NOTE — Telephone Encounter (Signed)
Pt c/o of Chest Pain: STAT if CP now or developed within 24 hours  1. Are you having CP right now? No, is having more of a chest tightness than pain   2. Are you experiencing any other symptoms (ex. SOB, nausea, vomiting, sweating)? Saturday patient was weak, had to sit down often, dizziness, BP has been high and low.  Some readings: Sat : 180/100 and 140/90 Mon: it was low 90/50 Pulse has been high 130, 110, 113  3. How long have you been experiencing CP? Starting noticing around Friday 8/21  4. Is your CP continuous or coming and going? Tightness is coming and going   5. Have you taken Nitroglycerin? Yes, on Saturday  ?

## 2018-09-10 ENCOUNTER — Encounter: Payer: Self-pay | Admitting: Nurse Practitioner

## 2018-09-10 ENCOUNTER — Other Ambulatory Visit
Admission: RE | Admit: 2018-09-10 | Discharge: 2018-09-10 | Disposition: A | Discharge status: Home / Self Care | Payer: Medicare Other | Source: Ambulatory Visit | Attending: Nurse Practitioner | Admitting: Nurse Practitioner

## 2018-09-10 ENCOUNTER — Ambulatory Visit (INDEPENDENT_AMBULATORY_CARE_PROVIDER_SITE_OTHER): Payer: Medicare Other | Admitting: Nurse Practitioner

## 2018-09-10 ENCOUNTER — Other Ambulatory Visit: Payer: Self-pay

## 2018-09-10 VITALS — BP 114/64 | HR 59 | Ht 68.5 in | Wt 182.0 lb

## 2018-09-10 DIAGNOSIS — E785 Hyperlipidemia, unspecified: Secondary | ICD-10-CM | POA: Diagnosis not present

## 2018-09-10 DIAGNOSIS — I25110 Atherosclerotic heart disease of native coronary artery with unstable angina pectoris: Secondary | ICD-10-CM | POA: Insufficient documentation

## 2018-09-10 DIAGNOSIS — I10 Essential (primary) hypertension: Secondary | ICD-10-CM | POA: Diagnosis not present

## 2018-09-10 DIAGNOSIS — I200 Unstable angina: Secondary | ICD-10-CM | POA: Diagnosis not present

## 2018-09-10 LAB — BASIC METABOLIC PANEL
Anion gap: 13 (ref 5–15)
BUN: 21 mg/dL (ref 8–23)
CO2: 24 mmol/L (ref 22–32)
Calcium: 9.5 mg/dL (ref 8.9–10.3)
Chloride: 101 mmol/L (ref 98–111)
Creatinine, Ser: 0.99 mg/dL (ref 0.61–1.24)
GFR calc Af Amer: >60 mL/min (ref >60)
GFR calc non Af Amer: >60 mL/min (ref >60)
Glucose, Bld: 171 mg/dL — ABNORMAL HIGH (ref 70–99)
Potassium: 3.7 mmol/L (ref 3.5–5.1)
Sodium: 138 mmol/L (ref 135–145)

## 2018-09-10 LAB — CBC
HCT: 35.4 % — ABNORMAL LOW (ref 39.0–52.0)
Hemoglobin: 12.3 g/dL — ABNORMAL LOW (ref 13.0–17.0)
MCH: 32.3 pg (ref 26.0–34.0)
MCHC: 34.7 g/dL (ref 30.0–36.0)
MCV: 92.9 fL (ref 80.0–100.0)
Platelets: 200 10*3/uL (ref 150–400)
RBC: 3.81 MIL/uL — ABNORMAL LOW (ref 4.22–5.81)
RDW: 16 % — ABNORMAL HIGH (ref 11.5–15.5)
WBC: 6.1 10*3/uL (ref 4.0–10.5)
nRBC: 0 % (ref 0.0–0.2)

## 2018-09-10 NOTE — Progress Notes (Signed)
Office Visit    Patient Name: Shane Mcknight Date of Encounter: 09/10/2018  Primary Care Provider:  Einar Pheasant, MD Primary Cardiologist:  Kathlyn Sacramento, MD  Chief Complaint    68 year old male with a history of CAD status post RCA stenting in June 2018, hypertension, hyperlipidemia, diabetes, sleep apnea, GERD, hiatal hernia, and esophageal stricture, who presents for follow-up related to exertional chest pain.  Past Medical History    Past Medical History:  Diagnosis Date   Abnormal liver function tests    Allergic state    Anemia    Arthritis    spine, hands, shoulder with previous cuff tear   CAD (coronary artery disease)    a.  11/2015 CT Chest/Abd: Coronary Ca2+ noted;  b. MV: EF 45-54%, med defect of mod severity in basal infsept, basal inf, mid infsept, and mid inf region-> infarct and peri-infarct ischemia-->intermediate risk; c. 05/2016 Cath/PCI: LM 40ost, LAD 30p, 34m, D1 60ost, LCX 60ost, OM1 99 (small->Med Rx), RCA 60p, 95d (2.5x15 Resolute Onyx DES), RPDA/RPLB min irregs, D2 60, EF 55-65%/   Candidiasis of esophagus (HCC)    Colon polyp    DDD (degenerative disc disease), lumbosacral    Dysphagia    Elevated transaminase level    Esophageal stricture    a. s/p multiple dilations - last 03/2016.   Esophagitis, [place] grade D    Ganglion cyst of finger of right hand    GERD (gastroesophageal reflux disease)    Guaiac positive stools    Hiatal hernia    HNP (herniated nucleus pulposus), lumbar    Hypercholesterolemia    Hypertension    Lumbar radiculitis    Mallory-Weiss tear    a. 11/2015.   Migraine aura without headache    migraine, visual   Psoriasis    with psoriatic arthritis   Psoriatic arthritis ([place])    severe   Schatzki's ring    Sleep apnea    does not uses CPAP. has lost weight so is much better.   Type II diabetes mellitus ([place])    Past Surgical History:  Procedure Laterality Date   ARTHROPLASTY     right thumb; left thumb with 2 screws and plastic joint   BALLOON DILATION N/A 05/29/2015   Procedure: BALLOON DILATION;  Surgeon: Lollie Sails, MD;  Location: [facility] ENDOSCOPY;  Service: Endoscopy;  Laterality: N/A;   BALLOON DILATION N/A 02/14/2017   Procedure: BALLOON DILATION;  Surgeon: Lollie Sails, MD;  Location: [facility] ENDOSCOPY;  Service: Endoscopy;  Laterality: N/A;   BILATERAL CARPAL TUNNEL RELEASE Bilateral 07/04/2017   Procedure: BILATERAL CARPAL TUNNEL RELEASE;  Surgeon: Leanor Kail, MD;  Location: [facility] ORS;  Service: Orthopedics;  Laterality: Bilateral;   BLEPHAROPLASTY     CARDIAC CATHETERIZATION     1 stent    CARPAL TUNNEL RELEASE  2005   right   CHOLECYSTECTOMY  2008   COLONOSCOPY     COLONOSCOPY WITH PROPOFOL N/A 02/13/2018   Procedure: COLONOSCOPY WITH PROPOFOL;  Surgeon: Lollie Sails, MD;  Location: [facility] ENDOSCOPY;  Service: Endoscopy;  Laterality: N/A;   CORONARY STENT INTERVENTION N/A 06/14/2016   Procedure: Coronary Stent Intervention;  Surgeon: Wellington Hampshire, MD;  Location: [place] CV LAB;  Service: Cardiovascular;  Laterality: N/A;   CYST EXCISION  04/14/2015   tendon sheath cyst excision; right ring finger cyst removed and trigger finger realease   ESOPHAGOGASTRODUODENOSCOPY     ESOPHAGOGASTRODUODENOSCOPY (EGD) WITH PROPOFOL N/A 05/29/2015   Procedure: ESOPHAGOGASTRODUODENOSCOPY (EGD)  WITH PROPOFOL;  Surgeon: Lollie Sails, MD;  Location: [facility] ENDOSCOPY;  Service: Endoscopy;  Laterality: N/A;   ESOPHAGOGASTRODUODENOSCOPY (EGD) WITH PROPOFOL N/A 07/04/2015   Procedure: ESOPHAGOGASTRODUODENOSCOPY (EGD) WITH PROPOFOL;  Surgeon: Lollie Sails, MD;  Location: [facility] ENDOSCOPY;  Service: Endoscopy;  Laterality: N/A;   ESOPHAGOGASTRODUODENOSCOPY (EGD) WITH PROPOFOL N/A 01/04/2016   Procedure: ESOPHAGOGASTRODUODENOSCOPY (EGD) WITH PROPOFOL;  Surgeon: Lollie Sails, MD;  Location: [facility] ENDOSCOPY;  Service: Endoscopy;  Laterality:  N/A;   ESOPHAGOGASTRODUODENOSCOPY (EGD) WITH PROPOFOL N/A 03/22/2016   Procedure: ESOPHAGOGASTRODUODENOSCOPY (EGD) WITH PROPOFOL;  Surgeon: Lollie Sails, MD;  Location: [facility] ENDOSCOPY;  Service: Endoscopy;  Laterality: N/A;   ESOPHAGOGASTRODUODENOSCOPY (EGD) WITH PROPOFOL N/A 02/14/2017   Procedure: ESOPHAGOGASTRODUODENOSCOPY (EGD) WITH PROPOFOL;  Surgeon: Lollie Sails, MD;  Location: [facility] ENDOSCOPY;  Service: Endoscopy;  Laterality: N/A;   ESOPHAGOGASTRODUODENOSCOPY (EGD) WITH PROPOFOL N/A 04/11/2017   Procedure: ESOPHAGOGASTRODUODENOSCOPY (EGD) WITH PROPOFOL;  Surgeon: Lollie Sails, MD;  Location: [facility] ENDOSCOPY;  Service: Endoscopy;  Laterality: N/A;   ESOPHAGOGASTRODUODENOSCOPY (EGD) WITH PROPOFOL N/A 07/07/2017   Procedure: ESOPHAGOGASTRODUODENOSCOPY (EGD) WITH PROPOFOL;  Surgeon: Lucilla Lame, MD;  Location: [facility] ENDOSCOPY;  Service: Endoscopy;  Laterality: N/A;   ESOPHAGOGASTRODUODENOSCOPY (EGD) WITH PROPOFOL N/A 02/13/2018   Procedure: ESOPHAGOGASTRODUODENOSCOPY (EGD) WITH PROPOFOL;  Surgeon: Lollie Sails, MD;  Location: [facility] ENDOSCOPY;  Service: Endoscopy;  Laterality: N/A;   EYE SURGERY     FOREIGN BODY REMOVAL N/A 12/03/2015   Procedure: FOREIGN BODY REMOVAL;  Surgeon: Wilford Corner, MD;  Location: [facility] ENDOSCOPY;  Service: Endoscopy;  Laterality: N/A;   HERNIA REPAIR Right 1970   inguinal   INGUINAL HERNIA REPAIR Left 01/01/2018   Procedure: OPEN HERNIA REPAIR INGUINAL ADULT;  Surgeon: Olean Ree, MD;  Location: [facility] ORS;  Service: General;  Laterality: Left;   LEFT HEART CATH Left 06/14/2016   Procedure: Left Heart Cath;  Surgeon: Wellington Hampshire, MD;  Location: [place] CV LAB;  Service: Cardiovascular;  Laterality: Left;   LUMBAR DISC SURGERY  2004   injections; herniated disc; percutaneous discectomy   NASAL SINUS SURGERY  2008   percutaneous lumbar discectomy     ROTATOR CUFF REPAIR Right    TONSILLECTOMY       Allergies  Allergies  Allergen Reactions   Ephedrine Other (See Comments)    Causing prostate to hurt. Using for sinus bronchodilators   Nsaids Other (See Comments)    GI upset, history of esophagitis and Mallory-Weis tear   Vancomycin Itching and Rash    Same as pcn with itching   Amoxicillin Itching and Rash   Levsin [Hyoscyamine Sulfate] Other (See Comments)    Urinary retention   Naprosyn [Naproxen] Other (See Comments)    GI upset Also happens with other NSAIDS    Other Rash    Paper tape causes rash Plastic tape is okay   Penicillins Itching and Rash    Has patient had a PCN reaction causing immediate rash, facial/tongue/throat swelling, SOB or lightheadedness with hypotension: yes Has patient had a PCN reaction causing severe rash involving mucus membranes or skin necrosis: no Has patient had a PCN reaction that required hospitalization: no Has patient had a PCN reaction occurring within the last 10 years: no If all of the above answers are "NO", then may proceed with Cephalosporin use.    Shellfish Allergy Rash    Ingested shellfish cCuses rash and itching along with stomach sickness. Pt tolerates betadine    Sudafed [Pseudoephedrine Hcl] Other (See Comments)  Constricts prostate flow (from ephedrine d/t sinus meds)    History of Present Illness    68 year old male with above complex past medical history including CAD status post abnormal stress testing in May 2018, followed by catheterization revealing severe RCA disease.  This was successfully treated with a drug-eluting stent.  He also has severe obtuse marginal disease however, this is a small vessel and is being medically managed.  Other history includes hypertension, hyperlipidemia, diabetes, sleep apnea, GERD, hiatal hernia, esophageal stricture, Mallory-Weiss tear, and Schatzki ring.  He also has a history of bilateral carpal tunnel syndrome with numbness involving his fingers and thumbs.  He  underwent carpal tunnel release last June without incident.  Effient was discontinued prior to the procedure.  Mr. Tranchina was last seen via telemedicine visit in June of this year, at which time he was doing quite well without symptoms or limitations.  Unfortunately, over the past week or so, he has been experiencing exertional substernal chest heaviness associated with fatigue, weakness, and mild lightheadedness, occurring with relatively minimal activity, lasting up to about 10 minutes, and resolving with rest.  He has not noticed any significant dyspnea on exertion.  Current symptoms are similar to what he experienced prior to his stenting in 2018 and he is interested in pursuing diagnostic catheterization if necessary.  He denies palpitations, PND, orthopnea, syncope, edema, or early satiety.  He has been compliant with his medications.  Home Medications    Prior to Admission medications   Medication Sig Start Date End Date Taking? Authorizing Provider  acetaminophen (TYLENOL) 500 MG tablet Take 1,000 mg by mouth daily as needed for moderate pain.   Yes [provider]  aspirin EC 81 MG tablet Take 81 mg by mouth every evening.    Yes [provider]  atenolol (TENORMIN) 50 MG tablet TAKE 1 TABLET BY MOUTH  TWICE A DAY 08/03/18  Yes Scott, Randell Patient, MD  COSENTYX SENSOREADY PEN 150 MG/ML SOAJ Inject 300 mg into the skin every 30 (thirty) days.  04/06/15  Yes [provider]  cyanocobalamin (,VITAMIN B-12,) 1000 MCG/ML injection Inject 1 mL (1,000 mcg total) into the muscle every 30 (thirty) days. 09/01/18  Yes Einar Pheasant, MD  FOLIC ACID PO Take 10 mcg by mouth every evening.    Yes [provider]  gabapentin (NEURONTIN) 300 MG capsule Take 1-2 capsules q hs Patient taking differently: Take 1-2 capsules prn 07/24/18  Yes Einar Pheasant, MD  hydrochlorothiazide (HYDRODIURIL) 25 MG tablet TAKE 1 TABLET BY MOUTH  DAILY 08/03/18  Yes Einar Pheasant, MD  losartan  (COZAAR) 50 MG tablet TAKE 1 TABLET BY MOUTH  DAILY 06/09/18  Yes Einar Pheasant, MD  metFORMIN (GLUCOPHAGE) 500 MG tablet TAKE 2 TABLETS BY MOUTH 2  TIMES DAILY WITH MEALS. 08/03/18  Yes Einar Pheasant, MD  methotrexate (RHEUMATREX) 2.5 MG tablet Take 2.5 mg by mouth once a week. Take 8 tablets every Friday. 02/26/18  Yes [provider]  Multiple Vitamin (MULTIVITAMIN) tablet Take 1 tablet by mouth daily.   Yes [provider]  nitroGLYCERIN (NITROSTAT) 0.4 MG SL tablet Place 1 tablet (0.4 mg total) under the tongue every 5 (five) minutes as needed. MAXIMUM OF 3 DOSES. 07/02/17  Yes Theora Gianotti, NP  pantoprazole (PROTONIX) 40 MG tablet TAKE 1 TABLET BY MOUTH  TWICE A DAY 06/05/18  Yes Einar Pheasant, MD  rosuvastatin (CRESTOR) 10 MG tablet Take 1 tablet (10 mg total) by mouth daily. 07/24/18  Yes Einar Pheasant,  MD  SYRINGE-NEEDLE, DISP, 3 ML 25G X 5/8" 3 ML MISC Use as instructed with B12 injection. 08/08/17  Yes Einar Pheasant, MD  triamcinolone cream (KENALOG) 0.5 % Apply 1 application topically 2 (two) times daily as needed (scaling).   Yes [provider]  vitamin E 400 UNIT capsule Take 400 Units by mouth every evening.   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  Scientist, product/process development strain: Not on file   Food insecurity    Worry: Not on file    Inability: Not on file   Transportation needs    Medical: Not on file    Non-medical: Not on file  Tobacco Use   Smoking status: Former Smoker    Types: Cigarettes     Quit date: 07/06/1982    Years since quitting: 36.2   Smokeless tobacco: Never Used  Substance and Sexual Activity   Alcohol use: Yes    Alcohol/week: 0.0 standard drinks    Comment: rarely   Drug use: No   Sexual activity: Not on file  Lifestyle   Physical activity    Days per week: Not on file    Minutes per session: Not on file   Stress: Not on file  Relationships   Social connections    Talks on phone: Not on file    Gets together: Not on file    Attends religious service: Not on file    Active member of club or organization: Not on file    Attends meetings of clubs or organizations: Not on file    Relationship status: Not on file   Intimate partner violence    Fear of current or ex partner: Not on file    Emotionally abused: Not on file    Physically abused: Not on file    Forced sexual activity: Not on file  Other Topics Concern   Not on file  Social History Narrative   Lives locally with wife.  Active but does not routinely exercise.    Review of Systems    General:  No chills, fever, night sweats or weight changes.  Cardiovascular:  +++ ex chest pain associated with fatigue and occasional lightheadedness.  No dyspnea on exertion, edema, orthopnea, palpitations, paroxysmal nocturnal dyspnea. Dermatological: No rash, lesions/masses Respiratory: No cough, dyspnea Urologic: No hematuria, dysuria Abdominal:   Occasional dysphagia.  No nausea, vomiting, diarrhea, bright red blood per rectum, melena, or hematemesis Neurologic:  No visual changes, wkns, changes in mental status. All other systems reviewed and are otherwise negative except as noted above.  Physical Exam    VS:  BP 114/64 (BP Location: Left Arm, Patient Position: Sitting, Cuff Size: Normal)    Pulse (!) 59    Ht 5' 8.5" (1.74 m)    Wt 182 lb (82.6 kg)    BMI 27.27 kg/m  , BMI Body mass index is 27.27 kg/m. GEN: Well nourished, well developed, in no acute distress. HEENT:  normal. Neck: Supple,  no JVD, carotid bruits, or masses. Cardiac: RRR, no murmurs, rubs, or gallops. No clubbing, cyanosis, edema.  Radials/DP/PT 2+ and equal bilaterally.  Respiratory:  Respirations regular and unlabored, clear to auscultation bilaterally. GI: Soft, nontender, nondistended, BS + x 4. MS: no deformity or atrophy. Skin: warm and dry, no rash. Neuro:  Strength and sensation are intact. Psych: Normal affect.  Accessory Clinical Findings    ECG personally reviewed by me today -sinus bradycardia, 59, left axis deviation- no acute changes.  Lab Results  Component Value Date   WBC 6.5 10/29/2017   HGB 13.8 10/29/2017   HCT 39.5 10/29/2017   MCV 86.4 10/29/2017   PLT 173.0 10/29/2017   Lab Results  Component Value Date   CREATININE 0.96 07/10/2018   BUN 26 (H) 07/10/2018   NA 140 07/10/2018   K 3.8 07/10/2018   CL 101 07/10/2018   CO2 29 07/10/2018   Lab Results  Component Value Date   ALT 18 07/10/2018   AST 15 07/10/2018   ALKPHOS 40 07/10/2018   BILITOT 0.4 07/10/2018   Lab Results  Component Value Date   CHOL 87 07/10/2018   HDL 31.30 (L) 07/10/2018   LDLCALC 20 07/10/2018   TRIG 178.0 (H) 07/10/2018   CHOLHDL 3 07/10/2018    Lab Results  Component Value Date   HGBA1C 6.2 07/10/2018    Assessment & Plan    1.  Unstable angina/coronary artery disease: Patient with prior history of coronary artery disease status post drug-eluting stent placement to the distal RCA in May 2018.  Catheterization at that time showed moderate nonobstructive disease throughout his coronary tree and also more severe disease in a small obtuse marginal.  He has been medically managed since then his has done quite well over the past 2+ years but over the past week has been having exertional substernal chest pressure associated with weakness and fatigue and occasional lightheadedness.  Symptoms last about 10 minutes and resolve with rest.  He identifies symptoms as being similar to what he experienced  back in 2018.  ECG is unchanged today and vital signs are stable.  We discussed options for management including stress testing and diagnostic catheterization.  The patient understands that risks include but are not limited to stroke (1 in 1000), death (1 in 9), kidney failure [usually temporary] (1 in 500), bleeding (1 in 200), allergic reaction [possibly serious] (1 in 200), and agrees to proceed with diagnostic catheterization.  **Of note he has chronic right wrist pain status post prior surgery and in that setting, his left radial was used at the time of his last PCI.  We will obtain labs and COVID testing today and he is scheduled for August 31 with Dr. Fletcher Anon.  He remains on aspirin, statin, beta-blocker, and ARB therapy.  He does have as needed nitrates.  I did consider adding long-acting nitrate however he notes that during episodes, he often sees drops in his blood pressure even prior to taking sublingual nitroglycerin and has had associated lightheadedness.  2.  Essential hypertension: Stable on current regimen.  Blood pressures appear to run soft during these episodes.  3.  Hyperlipidemia: LDL of 20 earlier this year on rosuvastatin therapy.  4.  Type 2 diabetes mellitus: A1c 6.2 earlier this year on metformin.  He is also on statin and ARB.  5.  Schatzki's ring/esophageal stricture: Followed closely by GI.  He does continue to experience dysphagia.  6.  Disposition: Follow-up lab work  in COVID-19 testing today.  Plan for diagnostic catheterization August 31.  Follow-up in clinic in approximately 2 weeks.  Murray Hodgkins, NP 09/10/2018, 3:06 PM

## 2018-09-10 NOTE — Patient Instructions (Signed)
Medication Instructions:  Your physician recommends that you continue on your current medications as directed. Please refer to the Current Medication list given to you today.  If you need a refill on your cardiac medications before your next appointment, please call your pharmacy.   Lab work: 1- Your physician recommends that you return for lab work TODAY at [facility]. (CBC, BMET) No appt is needed. Hours are M-F 7AM- 6 PM.  2- CV testing Please report to the PAT testing site on ___TODAY_____ date _____after visit______ time for your DRIVE THRU covid testing that is required prior to your procedure.    If you have labs (blood work) drawn today and your tests are completely normal, you will receive your results only by: Marland Kitchen MyChart Message (if you have MyChart) OR . A paper copy in the mail If you have any lab test that is abnormal or we need to change your treatment, we will call you to review the results.  Testing/Procedures: 1- L heart cath    [address], [address] [place] [address] Dept: [phone] Loc: [place]  09/10/2018  You are scheduled for a Cardiac Catheterization on Monday, August 31 with Dr. Kathlyn Sacramento.  1. Please arrive at the [place] [facility] at 9:30 AM (This time is one hours before your procedure to ensure your preparation). Free valet parking service is available.   Special note: Every effort is made to have your procedure done on time. Please understand that emergencies sometimes delay scheduled procedures.  2. Diet: Do not eat solid foods after midnight.  The patient may have clear liquids until 5am upon the day of the procedure.  3. Labs: You will need to have blood drawn on TODAY 4. Medication instructions in preparation for your procedure:  Stop taking, HTCZ (Hydrochlorothiazide) Sunday, August 30,  Do not take Diabetes  Med Glucophage (Metformin) on the day of the procedure and HOLD 48 HOURS AFTER THE PROCEDURE.  On the morning of your procedure, take your Aspirin and any morning medicines NOT listed above.  You may use sips of water.  5. Plan for one night stay--bring personal belongings. 6. Bring a current list of your medications and current insurance cards. 7. You MUST have a responsible person to drive you home. 8. Someone MUST be with you the first 24 hours after you arrive home or your discharge will be delayed. 9. Please wear clothes that are easy to get on and off and wear slip-on shoes.  Thank you for allowing [place] to care for you!   -- [place] [facility]   Follow-Up: At [facility], you and your health needs are our priority.  As part of our continuing mission to provide you with exceptional heart care, we have created designated Provider Care Teams.  These Care Teams include your primary Cardiologist (physician) and Advanced Practice Providers (APPs -  Physician Assistants and Nurse Practitioners) who all work together to provide you with the care you need, when you need it. You will need a follow up appointment in 2 weeks. You may see Kathlyn Sacramento, MD or Murray Hodgkins, NP.

## 2018-09-10 NOTE — H&P (View-Only) (Signed)
Office Visit    Patient Name: Shane Mcknight Date of Encounter: 09/10/2018  Primary Care Provider:  Einar Pheasant, MD Primary Cardiologist:  Kathlyn Sacramento, MD  Chief Complaint    68 year old male with a history of CAD status post RCA stenting in June 2018, hypertension, hyperlipidemia, diabetes, sleep apnea, GERD, hiatal hernia, and esophageal stricture, who presents for follow-up related to exertional chest pain.  Past Medical History    Past Medical History:  Diagnosis Date  . Abnormal liver function tests   . Allergic state   . Anemia   . Arthritis    spine, hands, shoulder with previous cuff tear  . CAD (coronary artery disease)    a.  11/2015 CT Chest/Abd: Coronary Ca2+ noted;  b. MV: EF 45-54%, med defect of mod severity in basal infsept, basal inf, mid infsept, and mid inf region-> infarct and peri-infarct ischemia-->intermediate risk; c. 05/2016 Cath/PCI: LM 40ost, LAD 30p, 42m, D1 60ost, LCX 60ost, OM1 99 (small->Med Rx), RCA 60p, 95d (2.5x15 Resolute Onyx DES), RPDA/RPLB min irregs, D2 60, EF 55-65%/  . Candidiasis of esophagus ([place])   . Colon polyp   . DDD (degenerative disc disease), lumbosacral   . Dysphagia   . Elevated transaminase level   . Esophageal stricture    a. s/p multiple dilations - last 03/2016.  Marland Kitchen Esophagitis, [place] grade D   . Ganglion cyst of finger of right hand   . GERD (gastroesophageal reflux disease)   . Guaiac positive stools   . Hiatal hernia   . HNP (herniated nucleus pulposus), lumbar   . Hypercholesterolemia   . Hypertension   . Lumbar radiculitis   . Mallory-Weiss tear    a. 11/2015.  . Migraine aura without headache    migraine, visual  . Psoriasis    with psoriatic arthritis  . Psoriatic arthritis (HCC)    severe  . Schatzki's ring   . Sleep apnea    does not uses CPAP. has lost weight so is much better.  . Type II diabetes mellitus ([place])    Past Surgical History:  Procedure Laterality Date  . ARTHROPLASTY      right thumb; left thumb with 2 screws and plastic joint  . BALLOON DILATION N/A 05/29/2015   Procedure: BALLOON DILATION;  Surgeon: Lollie Sails, MD;  Location: [facility] ENDOSCOPY;  Service: Endoscopy;  Laterality: N/A;  . BALLOON DILATION N/A 02/14/2017   Procedure: BALLOON DILATION;  Surgeon: Lollie Sails, MD;  Location: [facility] ENDOSCOPY;  Service: Endoscopy;  Laterality: N/A;  . BILATERAL CARPAL TUNNEL RELEASE Bilateral 07/04/2017   Procedure: BILATERAL CARPAL TUNNEL RELEASE;  Surgeon: Leanor Kail, MD;  Location: [facility] ORS;  Service: Orthopedics;  Laterality: Bilateral;  . BLEPHAROPLASTY    . CARDIAC CATHETERIZATION     1 stent   . CARPAL TUNNEL RELEASE  2005   right  . CHOLECYSTECTOMY  2008  . COLONOSCOPY    . COLONOSCOPY WITH PROPOFOL N/A 02/13/2018   Procedure: COLONOSCOPY WITH PROPOFOL;  Surgeon: Lollie Sails, MD;  Location: [facility] ENDOSCOPY;  Service: Endoscopy;  Laterality: N/A;  . CORONARY STENT INTERVENTION N/A 06/14/2016   Procedure: Coronary Stent Intervention;  Surgeon: Wellington Hampshire, MD;  Location: [place] [facility] LAB;  Service: Cardiovascular;  Laterality: N/A;  . CYST EXCISION  04/14/2015   tendon sheath cyst excision; right ring finger cyst removed and trigger finger realease  . ESOPHAGOGASTRODUODENOSCOPY    . ESOPHAGOGASTRODUODENOSCOPY (EGD) WITH PROPOFOL N/A 05/29/2015   Procedure: ESOPHAGOGASTRODUODENOSCOPY (  EGD) WITH PROPOFOL;  Surgeon: Lollie Sails, MD;  Location: [facility] ENDOSCOPY;  Service: Endoscopy;  Laterality: N/A;  . ESOPHAGOGASTRODUODENOSCOPY (EGD) WITH PROPOFOL N/A 07/04/2015   Procedure: ESOPHAGOGASTRODUODENOSCOPY (EGD) WITH PROPOFOL;  Surgeon: Lollie Sails, MD;  Location: [facility] ENDOSCOPY;  Service: Endoscopy;  Laterality: N/A;  . ESOPHAGOGASTRODUODENOSCOPY (EGD) WITH PROPOFOL N/A 01/04/2016   Procedure: ESOPHAGOGASTRODUODENOSCOPY (EGD) WITH PROPOFOL;  Surgeon: Lollie Sails, MD;  Location: [facility] ENDOSCOPY;  Service: Endoscopy;  Laterality:  N/A;  . ESOPHAGOGASTRODUODENOSCOPY (EGD) WITH PROPOFOL N/A 03/22/2016   Procedure: ESOPHAGOGASTRODUODENOSCOPY (EGD) WITH PROPOFOL;  Surgeon: Lollie Sails, MD;  Location: [facility] ENDOSCOPY;  Service: Endoscopy;  Laterality: N/A;  . ESOPHAGOGASTRODUODENOSCOPY (EGD) WITH PROPOFOL N/A 02/14/2017   Procedure: ESOPHAGOGASTRODUODENOSCOPY (EGD) WITH PROPOFOL;  Surgeon: Lollie Sails, MD;  Location: [facility] ENDOSCOPY;  Service: Endoscopy;  Laterality: N/A;  . ESOPHAGOGASTRODUODENOSCOPY (EGD) WITH PROPOFOL N/A 04/11/2017   Procedure: ESOPHAGOGASTRODUODENOSCOPY (EGD) WITH PROPOFOL;  Surgeon: Lollie Sails, MD;  Location: [facility] ENDOSCOPY;  Service: Endoscopy;  Laterality: N/A;  . ESOPHAGOGASTRODUODENOSCOPY (EGD) WITH PROPOFOL N/A 07/07/2017   Procedure: ESOPHAGOGASTRODUODENOSCOPY (EGD) WITH PROPOFOL;  Surgeon: Lucilla Lame, MD;  Location: [facility] ENDOSCOPY;  Service: Endoscopy;  Laterality: N/A;  . ESOPHAGOGASTRODUODENOSCOPY (EGD) WITH PROPOFOL N/A 02/13/2018   Procedure: ESOPHAGOGASTRODUODENOSCOPY (EGD) WITH PROPOFOL;  Surgeon: Lollie Sails, MD;  Location: [facility] ENDOSCOPY;  Service: Endoscopy;  Laterality: N/A;  . EYE SURGERY    . FOREIGN BODY REMOVAL N/A 12/03/2015   Procedure: FOREIGN BODY REMOVAL;  Surgeon: Wilford Corner, MD;  Location: [facility] ENDOSCOPY;  Service: Endoscopy;  Laterality: N/A;  . HERNIA REPAIR Right 1970   inguinal  . INGUINAL HERNIA REPAIR Left 01/01/2018   Procedure: OPEN HERNIA REPAIR INGUINAL ADULT;  Surgeon: Olean Ree, MD;  Location: [facility] ORS;  Service: General;  Laterality: Left;  . LEFT HEART CATH Left 06/14/2016   Procedure: Left Heart Cath;  Surgeon: Wellington Hampshire, MD;  Location: [place] CV LAB;  Service: Cardiovascular;  Laterality: Left;  . LUMBAR DISC SURGERY  2004   injections; herniated disc; percutaneous discectomy  . NASAL SINUS SURGERY  2008  . percutaneous lumbar discectomy    . ROTATOR CUFF REPAIR Right   . TONSILLECTOMY       Allergies  Allergies  Allergen Reactions  . Ephedrine Other (See Comments)    Causing prostate to hurt. Using for sinus bronchodilators  . Nsaids Other (See Comments)    GI upset, history of esophagitis and Mallory-Weis tear  . Vancomycin Itching and Rash    Same as pcn with itching  . Amoxicillin Itching and Rash  . Levsin [Hyoscyamine Sulfate] Other (See Comments)    Urinary retention  . Naprosyn [Naproxen] Other (See Comments)    GI upset Also happens with other NSAIDS   . Other Rash    Paper tape causes rash Plastic tape is okay  . Penicillins Itching and Rash    Has patient had a PCN reaction causing immediate rash, facial/tongue/throat swelling, SOB or lightheadedness with hypotension: yes Has patient had a PCN reaction causing severe rash involving mucus membranes or skin necrosis: no Has patient had a PCN reaction that required hospitalization: no Has patient had a PCN reaction occurring within the last 10 years: no If all of the above answers are "NO", then may proceed with Cephalosporin use.   . Shellfish Allergy Rash    Ingested shellfish cCuses rash and itching along with stomach sickness. Pt tolerates betadine   . Sudafed [Pseudoephedrine Hcl] Other (See Comments)  Constricts prostate flow (from ephedrine d/t sinus meds)    History of Present Illness    68 year old male with above complex past medical history including CAD status post abnormal stress testing in May 2018, followed by catheterization revealing severe RCA disease.  This was successfully treated with a drug-eluting stent.  He also has severe obtuse marginal disease however, this is a small vessel and is being medically managed.  Other history includes hypertension, hyperlipidemia, diabetes, sleep apnea, GERD, hiatal hernia, esophageal stricture, Mallory-Weiss tear, and Schatzki ring.  He also has a history of bilateral carpal tunnel syndrome with numbness involving his fingers and thumbs.  He  underwent carpal tunnel release last June without incident.  Effient was discontinued prior to the procedure.  Mr. Ary was last seen via telemedicine visit in June of this year, at which time he was doing quite well without symptoms or limitations.  Unfortunately, over the past week or so, he has been experiencing exertional substernal chest heaviness associated with fatigue, weakness, and mild lightheadedness, occurring with relatively minimal activity, lasting up to about 10 minutes, and resolving with rest.  He has not noticed any significant dyspnea on exertion.  Current symptoms are similar to what he experienced prior to his stenting in 2018 and he is interested in pursuing diagnostic catheterization if necessary.  He denies palpitations, PND, orthopnea, syncope, edema, or early satiety.  He has been compliant with his medications.  Home Medications    Prior to Admission medications   Medication Sig Start Date End Date Taking? Authorizing Provider  acetaminophen (TYLENOL) 500 MG tablet Take 1,000 mg by mouth daily as needed for moderate pain.   Yes [provider]  aspirin EC 81 MG tablet Take 81 mg by mouth every evening.    Yes [provider]  atenolol (TENORMIN) 50 MG tablet TAKE 1 TABLET BY MOUTH  TWICE A DAY 08/03/18  Yes Scott, Randell Patient, MD  COSENTYX SENSOREADY PEN 150 MG/ML SOAJ Inject 300 mg into the skin every 30 (thirty) days.  04/06/15  Yes [provider]  cyanocobalamin (,VITAMIN B-12,) 1000 MCG/ML injection Inject 1 mL (1,000 mcg total) into the muscle every 30 (thirty) days. 09/01/18  Yes Einar Pheasant, MD  FOLIC ACID PO Take 10 mcg by mouth every evening.    Yes [provider]  gabapentin (NEURONTIN) 300 MG capsule Take 1-2 capsules q hs Patient taking differently: Take 1-2 capsules prn 07/24/18  Yes Einar Pheasant, MD  hydrochlorothiazide (HYDRODIURIL) 25 MG tablet TAKE 1 TABLET BY MOUTH  DAILY 08/03/18  Yes Einar Pheasant, MD  losartan  (COZAAR) 50 MG tablet TAKE 1 TABLET BY MOUTH  DAILY 06/09/18  Yes Einar Pheasant, MD  metFORMIN (GLUCOPHAGE) 500 MG tablet TAKE 2 TABLETS BY MOUTH 2  TIMES DAILY WITH MEALS. 08/03/18  Yes Einar Pheasant, MD  methotrexate (RHEUMATREX) 2.5 MG tablet Take 2.5 mg by mouth once a week. Take 8 tablets every Friday. 02/26/18  Yes [provider]  Multiple Vitamin (MULTIVITAMIN) tablet Take 1 tablet by mouth daily.   Yes [provider]  nitroGLYCERIN (NITROSTAT) 0.4 MG SL tablet Place 1 tablet (0.4 mg total) under the tongue every 5 (five) minutes as needed. MAXIMUM OF 3 DOSES. 07/02/17  Yes Theora Gianotti, NP  pantoprazole (PROTONIX) 40 MG tablet TAKE 1 TABLET BY MOUTH  TWICE A DAY 06/05/18  Yes Einar Pheasant, MD  rosuvastatin (CRESTOR) 10 MG tablet Take 1 tablet (10 mg total) by mouth daily. 07/24/18  Yes Einar Pheasant,  MD  SYRINGE-NEEDLE, DISP, 3 ML 25G X 5/8" 3 ML MISC Use as instructed with B12 injection. 08/08/17  Yes Einar Pheasant, MD  triamcinolone cream (KENALOG) 0.5 % Apply 1 application topically 2 (two) times daily as needed (scaling).   Yes [provider]  vitamin E 400 UNIT capsule Take 400 Units by mouth every evening.   Yes [provider]   Family History    Family History  Problem Relation Age of Onset  . Lung cancer Father   . Cancer Father   . Hypertension Mother   . Aneurysm Son     Social History    Social History   Socioeconomic History  . Marital status: Married    Spouse name: Pamala Hurry  . Number of children: Not on file  . Years of education: Not on file  . Highest education level: Not on file  Occupational History    Comment: not heavy work  Social Needs  . Financial resource strain: Not on file  . Food insecurity    Worry: Not on file    Inability: Not on file  . Transportation needs    Medical: Not on file    Non-medical: Not on file  Tobacco Use  . Smoking status: Former Smoker    Types: Cigarettes     Quit date: 07/06/1982    Years since quitting: 36.2  . Smokeless tobacco: Never Used  Substance and Sexual Activity  . Alcohol use: Yes    Alcohol/week: 0.0 standard drinks    Comment: rarely  . Drug use: No  . Sexual activity: Not on file  Lifestyle  . Physical activity    Days per week: Not on file    Minutes per session: Not on file  . Stress: Not on file  Relationships  . Social Herbalist on phone: Not on file    Gets together: Not on file    Attends religious service: Not on file    Active member of club or organization: Not on file    Attends meetings of clubs or organizations: Not on file    Relationship status: Not on file  . Intimate partner violence    Fear of current or ex partner: Not on file    Emotionally abused: Not on file    Physically abused: Not on file    Forced sexual activity: Not on file  Other Topics Concern  . Not on file  Social History Narrative   Lives locally with wife.  Active but does not routinely exercise.    Review of Systems    General:  No chills, fever, night sweats or weight changes.  Cardiovascular:  +++ ex chest pain associated with fatigue and occasional lightheadedness.  No dyspnea on exertion, edema, orthopnea, palpitations, paroxysmal nocturnal dyspnea. Dermatological: No rash, lesions/masses Respiratory: No cough, dyspnea Urologic: No hematuria, dysuria Abdominal:   Occasional dysphagia.  No nausea, vomiting, diarrhea, bright red blood per rectum, melena, or hematemesis Neurologic:  No visual changes, wkns, changes in mental status. All other systems reviewed and are otherwise negative except as noted above.  Physical Exam    VS:  BP 114/64 (BP Location: Left Arm, Patient Position: Sitting, Cuff Size: Normal)   Pulse (!) 59   Ht 5' 8.5" (1.74 m)   Wt 182 lb (82.6 kg)   BMI 27.27 kg/m  , BMI Body mass index is 27.27 kg/m. GEN: Well nourished, well developed, in no acute distress. HEENT: normal. Neck: Supple,  no JVD, carotid bruits, or masses. Cardiac: RRR, no murmurs, rubs, or gallops. No clubbing, cyanosis, edema.  Radials/DP/PT 2+ and equal bilaterally.  Respiratory:  Respirations regular and unlabored, clear to auscultation bilaterally. GI: Soft, nontender, nondistended, BS + x 4. MS: no deformity or atrophy. Skin: warm and dry, no rash. Neuro:  Strength and sensation are intact. Psych: Normal affect.  Accessory Clinical Findings    ECG personally reviewed by me today -sinus bradycardia, 59, left axis deviation- no acute changes.  Lab Results  Component Value Date   WBC 6.5 10/29/2017   HGB 13.8 10/29/2017   HCT 39.5 10/29/2017   MCV 86.4 10/29/2017   PLT 173.0 10/29/2017   Lab Results  Component Value Date   CREATININE 0.96 07/10/2018   BUN 26 (H) 07/10/2018   NA 140 07/10/2018   K 3.8 07/10/2018   CL 101 07/10/2018   CO2 29 07/10/2018   Lab Results  Component Value Date   ALT 18 07/10/2018   AST 15 07/10/2018   ALKPHOS 40 07/10/2018   BILITOT 0.4 07/10/2018   Lab Results  Component Value Date   CHOL 87 07/10/2018   HDL 31.30 (L) 07/10/2018   LDLCALC 20 07/10/2018   TRIG 178.0 (H) 07/10/2018   CHOLHDL 3 07/10/2018    Lab Results  Component Value Date   HGBA1C 6.2 07/10/2018    Assessment & Plan    1.  Unstable angina/coronary artery disease: Patient with prior history of coronary artery disease status post drug-eluting stent placement to the distal RCA in May 2018.  Catheterization at that time showed moderate nonobstructive disease throughout his coronary tree and also more severe disease in a small obtuse marginal.  He has been medically managed since then his has done quite well over the past 2+ years but over the past week has been having exertional substernal chest pressure associated with weakness and fatigue and occasional lightheadedness.  Symptoms last about 10 minutes and resolve with rest.  He identifies symptoms as being similar to what he experienced  back in 2018.  ECG is unchanged today and vital signs are stable.  We discussed options for management including stress testing and diagnostic catheterization.  The patient understands that risks include but are not limited to stroke (1 in 1000), death (1 in 44), kidney failure [usually temporary] (1 in 500), bleeding (1 in 200), allergic reaction [possibly serious] (1 in 200), and agrees to proceed with diagnostic catheterization.  **Of note he has chronic right wrist pain status post prior surgery and in that setting, his left radial was used at the time of his last PCI.  We will obtain labs and COVID testing today and he is scheduled for August 31 with Dr. Fletcher Anon.  He remains on aspirin, statin, beta-blocker, and ARB therapy.  He does have as needed nitrates.  I did consider adding long-acting nitrate however he notes that during episodes, he often sees drops in his blood pressure even prior to taking sublingual nitroglycerin and has had associated lightheadedness.  2.  Essential hypertension: Stable on current regimen.  Blood pressures appear to run soft during these episodes.  3.  Hyperlipidemia: LDL of 20 earlier this year on rosuvastatin therapy.  4.  Type 2 diabetes mellitus: A1c 6.2 earlier this year on metformin.  He is also on statin and ARB.  5.  Schatzki's ring/esophageal stricture: Followed closely by GI.  He does continue to experience dysphagia.  6.  Disposition: Follow-up lab work in [place] testing today.  Plan for diagnostic catheterization August 31.  Follow-up in clinic in approximately 2 weeks.  Murray Hodgkins, NP 09/10/2018, 3:06 PM

## 2018-09-11 ENCOUNTER — Other Ambulatory Visit
Admission: RE | Admit: 2018-09-11 | Discharge: 2018-09-11 | Disposition: A | Discharge status: Home / Self Care | Payer: Medicare Other | Source: Ambulatory Visit | Attending: Cardiovascular Disease | Admitting: Cardiovascular Disease

## 2018-09-11 DIAGNOSIS — Z01812 Encounter for preprocedural laboratory examination: Secondary | ICD-10-CM | POA: Insufficient documentation

## 2018-09-11 DIAGNOSIS — Z20828 Contact with and (suspected) exposure to other viral communicable diseases: Secondary | ICD-10-CM | POA: Insufficient documentation

## 2018-09-12 LAB — SARS CORONAVIRUS 2 (TAT 6-24 HRS): SARS Coronavirus 2: NEGATIVE

## 2018-09-14 ENCOUNTER — Encounter
Admission: RE | Disposition: A | Discharge status: Home / Self Care | Payer: Self-pay | Source: Home / Self Care | Attending: Cardiovascular Disease

## 2018-09-14 ENCOUNTER — Other Ambulatory Visit: Payer: Self-pay

## 2018-09-14 ENCOUNTER — Ambulatory Visit
Admission: RE | Admit: 2018-09-14 | Discharge: 2018-09-14 | Disposition: A | Discharge status: Home / Self Care | Payer: Medicare Other | Attending: Cardiovascular Disease | Admitting: Cardiovascular Disease

## 2018-09-14 DIAGNOSIS — Z955 Presence of coronary angioplasty implant and graft: Secondary | ICD-10-CM | POA: Insufficient documentation

## 2018-09-14 DIAGNOSIS — Z881 Allergy status to other antibiotic agents status: Secondary | ICD-10-CM | POA: Diagnosis not present

## 2018-09-14 DIAGNOSIS — Z87891 Personal history of nicotine dependence: Secondary | ICD-10-CM | POA: Diagnosis not present

## 2018-09-14 DIAGNOSIS — K222 Esophageal obstruction: Secondary | ICD-10-CM | POA: Diagnosis not present

## 2018-09-14 DIAGNOSIS — G4730 Sleep apnea, unspecified: Secondary | ICD-10-CM | POA: Insufficient documentation

## 2018-09-14 DIAGNOSIS — E7800 Pure hypercholesterolemia, unspecified: Secondary | ICD-10-CM | POA: Insufficient documentation

## 2018-09-14 DIAGNOSIS — Z79899 Other long term (current) drug therapy: Secondary | ICD-10-CM | POA: Insufficient documentation

## 2018-09-14 DIAGNOSIS — I2510 Atherosclerotic heart disease of native coronary artery without angina pectoris: Secondary | ICD-10-CM | POA: Diagnosis not present

## 2018-09-14 DIAGNOSIS — E785 Hyperlipidemia, unspecified: Secondary | ICD-10-CM | POA: Insufficient documentation

## 2018-09-14 DIAGNOSIS — E119 Type 2 diabetes mellitus without complications: Secondary | ICD-10-CM | POA: Diagnosis not present

## 2018-09-14 DIAGNOSIS — L4050 Arthropathic psoriasis, unspecified: Secondary | ICD-10-CM | POA: Insufficient documentation

## 2018-09-14 DIAGNOSIS — R1310 Dysphagia, unspecified: Secondary | ICD-10-CM | POA: Insufficient documentation

## 2018-09-14 DIAGNOSIS — K219 Gastro-esophageal reflux disease without esophagitis: Secondary | ICD-10-CM | POA: Diagnosis not present

## 2018-09-14 DIAGNOSIS — R079 Chest pain, unspecified: Secondary | ICD-10-CM | POA: Diagnosis not present

## 2018-09-14 DIAGNOSIS — Z7982 Long term (current) use of aspirin: Secondary | ICD-10-CM | POA: Insufficient documentation

## 2018-09-14 DIAGNOSIS — I200 Unstable angina: Secondary | ICD-10-CM

## 2018-09-14 DIAGNOSIS — Z886 Allergy status to analgesic agent status: Secondary | ICD-10-CM | POA: Diagnosis not present

## 2018-09-14 DIAGNOSIS — Z888 Allergy status to other drugs, medicaments and biological substances status: Secondary | ICD-10-CM | POA: Diagnosis not present

## 2018-09-14 DIAGNOSIS — Z880 Allergy status to penicillin: Secondary | ICD-10-CM | POA: Diagnosis not present

## 2018-09-14 DIAGNOSIS — I25110 Atherosclerotic heart disease of native coronary artery with unstable angina pectoris: Secondary | ICD-10-CM | POA: Diagnosis not present

## 2018-09-14 DIAGNOSIS — Z7984 Long term (current) use of oral hypoglycemic drugs: Secondary | ICD-10-CM | POA: Insufficient documentation

## 2018-09-14 DIAGNOSIS — I10 Essential (primary) hypertension: Secondary | ICD-10-CM | POA: Insufficient documentation

## 2018-09-14 HISTORY — PX: LEFT HEART CATH AND CORONARY ANGIOGRAPHY: CATH118249

## 2018-09-14 LAB — GLUCOSE, CAPILLARY
Glucose-Capillary: 131 mg/dL — ABNORMAL HIGH (ref 70–99)
Glucose-Capillary: 100 mg/dL — ABNORMAL HIGH (ref 70–99)

## 2018-09-14 SURGERY — LEFT HEART CATH AND CORONARY ANGIOGRAPHY
Anesthesia: Moderate Sedation | Laterality: Left

## 2018-09-14 MED ORDER — VERAPAMIL HCL 2.5 MG/ML IV SOLN
INTRAVENOUS | Status: DC | PRN
  Administered 2018-09-14: 2.5 mg via INTRA_ARTERIAL

## 2018-09-14 MED ORDER — HEPARIN SODIUM (PORCINE) 1000 UNIT/ML IJ SOLN
INTRAMUSCULAR | Status: AC
  Filled 2018-09-14: qty 1

## 2018-09-14 MED ORDER — SODIUM CHLORIDE 0.9 % IV SOLN: INTRAVENOUS | Status: DC

## 2018-09-14 MED ORDER — SODIUM CHLORIDE 0.9% FLUSH: 3.0000 mL | Freq: Two times a day (BID) | INTRAVENOUS | Status: DC

## 2018-09-14 MED ORDER — ASPIRIN 81 MG PO CHEW
81.0000 mg | CHEWABLE_TABLET | ORAL | Status: AC
  Administered 2018-09-14: 81 mg via ORAL

## 2018-09-14 MED ORDER — ISOSORBIDE MONONITRATE ER 30 MG PO TB24: 30.0000 mg | ORAL_TABLET | Freq: Every day | ORAL | 6 refills | Status: DC

## 2018-09-14 MED ORDER — ASPIRIN 81 MG PO CHEW
CHEWABLE_TABLET | ORAL | Status: AC
  Filled 2018-09-14: qty 1

## 2018-09-14 MED ORDER — HEPARIN (PORCINE) IN NACL 1000-0.9 UT/500ML-% IV SOLN
INTRAVENOUS | Status: AC
  Filled 2018-09-14: qty 1000

## 2018-09-14 MED ORDER — SODIUM CHLORIDE 0.9 % IV SOLN: 250.0000 mL | INTRAVENOUS | Status: DC | PRN

## 2018-09-14 MED ORDER — SODIUM CHLORIDE 0.9 % WEIGHT BASED INFUSION
3.0000 mL/kg/h | INTRAVENOUS | Status: AC
  Administered 2018-09-14: 3 mL/kg/h via INTRAVENOUS

## 2018-09-14 MED ORDER — SODIUM CHLORIDE 0.9% FLUSH: 3.0000 mL | INTRAVENOUS | Status: DC | PRN

## 2018-09-14 MED ORDER — VERAPAMIL HCL 2.5 MG/ML IV SOLN
INTRAVENOUS | Status: AC
  Filled 2018-09-14: qty 2

## 2018-09-14 MED ORDER — HEPARIN (PORCINE) IN NACL 2000-0.9 UNIT/L-% IV SOLN
INTRAVENOUS | Status: DC | PRN
  Administered 2018-09-14: 500 mL

## 2018-09-14 MED ORDER — FENTANYL CITRATE (PF) 100 MCG/2ML IJ SOLN
INTRAMUSCULAR | Status: AC
  Filled 2018-09-14: qty 2

## 2018-09-14 MED ORDER — MIDAZOLAM HCL 2 MG/2ML IJ SOLN
INTRAMUSCULAR | Status: DC | PRN
  Administered 2018-09-14: 1 mg via INTRAVENOUS

## 2018-09-14 MED ORDER — HEPARIN SODIUM (PORCINE) 1000 UNIT/ML IJ SOLN
INTRAMUSCULAR | Status: DC | PRN
  Administered 2018-09-14: 4000 [IU] via INTRAVENOUS

## 2018-09-14 MED ORDER — FENTANYL CITRATE (PF) 100 MCG/2ML IJ SOLN
INTRAMUSCULAR | Status: DC | PRN
  Administered 2018-09-14: 25 ug via INTRAVENOUS

## 2018-09-14 MED ORDER — IOHEXOL 300 MG/ML  SOLN
INTRAMUSCULAR | Status: DC | PRN
  Administered 2018-09-14: 14:00:00 70 mL via INTRA_ARTERIAL

## 2018-09-14 MED ORDER — SODIUM CHLORIDE 0.9 % WEIGHT BASED INFUSION: 1.0000 mL/kg/h | INTRAVENOUS | Status: DC

## 2018-09-14 MED ORDER — ACETAMINOPHEN 325 MG PO TABS: 650.0000 mg | ORAL_TABLET | ORAL | Status: DC | PRN

## 2018-09-14 MED ORDER — ONDANSETRON HCL 4 MG/2ML IJ SOLN: 4.0000 mg | Freq: Four times a day (QID) | INTRAMUSCULAR | Status: DC | PRN

## 2018-09-14 MED ORDER — MIDAZOLAM HCL 2 MG/2ML IJ SOLN
INTRAMUSCULAR | Status: AC
  Filled 2018-09-14: qty 2

## 2018-09-14 SURGICAL SUPPLY — 8 items
CATH INFINITI 5FR ANG PIGTAIL (CATHETERS) ×2 IMPLANT
CATH INFINITI 5FR JL4 (CATHETERS) ×2 IMPLANT
CATH INFINITI JR4 5F (CATHETERS) ×2 IMPLANT
DEVICE RAD TR BAND REGULAR (VASCULAR PRODUCTS) ×2 IMPLANT
GLIDESHEATH SLEND SS 6F .021 (SHEATH) ×2 IMPLANT
KIT MANI 3VAL PERCEP (MISCELLANEOUS) ×3 IMPLANT
PACK CARDIAC CATH (CUSTOM PROCEDURE TRAY) ×3 IMPLANT
WIRE ROSEN-J .035X260CM (WIRE) ×2 IMPLANT

## 2018-09-14 NOTE — Progress Notes (Signed)
Dr. Fletcher Anon at bedside, speaking with Shane Mcknight. Re: Cath results. Unable to reach Shane Mcknight. Wife Pamala Hurry via phone; left message for wife to phone back. Shane Mcknight. Verbalizes understanding of conversation.

## 2018-09-14 NOTE — Interval H&P Note (Signed)
Cath Lab Visit (complete for each Cath Lab visit)  Clinical Evaluation Leading to the Procedure:   ACS: No.  Non-ACS:    Anginal Classification: CCS III  Anti-ischemic medical therapy: Minimal Therapy (1 class of medications)  Non-Invasive Test Results: No non-invasive testing performed  Prior CABG: No previous CABG      History and Physical Interval Note:  09/14/2018 1:38 PM  Shane Mcknight  has presented today for surgery, with the diagnosis of LT Heart Cath   Chest Pain.  The various methods of treatment have been discussed with the patient and family. After consideration of risks, benefits and other options for treatment, the patient has consented to  Procedure(s): LEFT HEART CATH AND CORONARY ANGIOGRAPHY (Left) as a surgical intervention.  [name] history has been reviewed, patient examined, no change in status, stable for surgery.  I have reviewed the patient's chart and labs.  Questions were answered to the patient's satisfaction.     Kathlyn Sacramento

## 2018-09-15 ENCOUNTER — Encounter: Payer: Self-pay | Admitting: Cardiovascular Disease

## 2018-09-18 ENCOUNTER — Telehealth: Payer: Self-pay | Admitting: Cardiovascular Disease

## 2018-09-18 NOTE — Telephone Encounter (Signed)
Patient contacted our office requesting letter to return back to work on 09/22/2018.  And discussion with clinical staff the patient has been doing well since his cardiac cath and denies any symptoms concerning for worsening angina.  He appears to be tolerating medications without issues.  He has scheduled follow-up with me on 09/28/2018.  Given he will be greater than 1 week out from his diagnostic cath without needed intervention on 9/8 and in the setting of the patient being without anginal symptoms he is able to return to work at his request.

## 2018-09-18 NOTE — Telephone Encounter (Signed)
Spoke with the patient. Patient sts that he is not having any cardiac symptoms and he is requesting a letter to return to work on 09/22/18. The letter needs to specify if he can return w/wo restrictions. Patient sts that he needs the letter by 3pm today. Advised the patient that Dr. Fletcher Anon is out of the office this afternoon. He is scheduled to see Christell Faith, PA on 09/28/18. Advised the patient that I will fwd the request to [facility].   Spoke with the patient. Advised him that Thurmond Butts will draft the letter and I will fwd it through [place] as he requested. Patient voiced appreciation for the assistance.

## 2018-09-18 NOTE — Telephone Encounter (Signed)
Patient calling in following heart cath procedure on 8/31. Patient is needing a note for returning to work involving patient restrictions. Patient is returning on 9/8. Patient provided fax # [phone] attn to [facility].   Please advise patient regardless.

## 2018-09-18 NOTE — Telephone Encounter (Signed)
Patient calling in stating he needs to letter by 3 pm today. Please advise

## 2018-09-18 NOTE — Telephone Encounter (Signed)
Per patient please send to mychart instead

## 2018-09-18 NOTE — Telephone Encounter (Signed)
Noted. Request for a letter to return to work w/wo restrictions  fwd to Dr. Fletcher Anon.

## 2018-09-22 ENCOUNTER — Other Ambulatory Visit: Payer: Self-pay

## 2018-09-22 ENCOUNTER — Telehealth: Payer: Self-pay

## 2018-09-22 ENCOUNTER — Emergency Department
Admission: EM | Admit: 2018-09-22 | Discharge: 2018-09-23 | Disposition: A | Discharge status: Home / Self Care | Payer: Medicare Other | Attending: Emergency Medicine | Admitting: Emergency Medicine

## 2018-09-22 ENCOUNTER — Encounter: Payer: Self-pay | Admitting: Emergency Medicine

## 2018-09-22 ENCOUNTER — Telehealth: Payer: Self-pay | Admitting: Physician Assistant

## 2018-09-22 ENCOUNTER — Encounter: Payer: Self-pay | Admitting: Internal Medicine

## 2018-09-22 ENCOUNTER — Ambulatory Visit (INDEPENDENT_AMBULATORY_CARE_PROVIDER_SITE_OTHER): Payer: Medicare Other | Admitting: Internal Medicine

## 2018-09-22 ENCOUNTER — Emergency Department: Payer: Medicare Other

## 2018-09-22 DIAGNOSIS — Z7982 Long term (current) use of aspirin: Secondary | ICD-10-CM | POA: Diagnosis not present

## 2018-09-22 DIAGNOSIS — L4050 Arthropathic psoriasis, unspecified: Secondary | ICD-10-CM | POA: Diagnosis not present

## 2018-09-22 DIAGNOSIS — R509 Fever, unspecified: Secondary | ICD-10-CM

## 2018-09-22 DIAGNOSIS — N183 Chronic kidney disease, stage 3 (moderate): Secondary | ICD-10-CM | POA: Diagnosis not present

## 2018-09-22 DIAGNOSIS — E1122 Type 2 diabetes mellitus with diabetic chronic kidney disease: Secondary | ICD-10-CM | POA: Diagnosis not present

## 2018-09-22 DIAGNOSIS — Z79899 Other long term (current) drug therapy: Secondary | ICD-10-CM | POA: Diagnosis not present

## 2018-09-22 DIAGNOSIS — I25110 Atherosclerotic heart disease of native coronary artery with unstable angina pectoris: Secondary | ICD-10-CM

## 2018-09-22 DIAGNOSIS — R0789 Other chest pain: Secondary | ICD-10-CM | POA: Diagnosis not present

## 2018-09-22 DIAGNOSIS — R1310 Dysphagia, unspecified: Secondary | ICD-10-CM

## 2018-09-22 DIAGNOSIS — I2510 Atherosclerotic heart disease of native coronary artery without angina pectoris: Secondary | ICD-10-CM | POA: Diagnosis not present

## 2018-09-22 DIAGNOSIS — R1319 Other dysphagia: Secondary | ICD-10-CM

## 2018-09-22 DIAGNOSIS — I129 Hypertensive chronic kidney disease with stage 1 through stage 4 chronic kidney disease, or unspecified chronic kidney disease: Secondary | ICD-10-CM | POA: Insufficient documentation

## 2018-09-22 DIAGNOSIS — E0822 Diabetes mellitus due to underlying condition with diabetic chronic kidney disease: Secondary | ICD-10-CM | POA: Diagnosis not present

## 2018-09-22 DIAGNOSIS — Z87891 Personal history of nicotine dependence: Secondary | ICD-10-CM | POA: Diagnosis not present

## 2018-09-22 DIAGNOSIS — R6889 Other general symptoms and signs: Secondary | ICD-10-CM | POA: Diagnosis not present

## 2018-09-22 DIAGNOSIS — Z20822 Contact with and (suspected) exposure to covid-19: Secondary | ICD-10-CM

## 2018-09-22 DIAGNOSIS — Z794 Long term (current) use of insulin: Secondary | ICD-10-CM | POA: Insufficient documentation

## 2018-09-22 DIAGNOSIS — R5082 Postprocedural fever: Secondary | ICD-10-CM

## 2018-09-22 LAB — TROPONIN I (HIGH SENSITIVITY)
Troponin I (High Sensitivity): 3 ng/L (ref <18)
Troponin I (High Sensitivity): 3 ng/L (ref <18)

## 2018-09-22 LAB — CBC
HCT: 34.5 % — ABNORMAL LOW (ref 39.0–52.0)
Hemoglobin: 11.7 g/dL — ABNORMAL LOW (ref 13.0–17.0)
MCH: 32.1 pg (ref 26.0–34.0)
MCHC: 33.9 g/dL (ref 30.0–36.0)
MCV: 94.5 fL (ref 80.0–100.0)
Platelets: 196 10*3/uL (ref 150–400)
RBC: 3.65 MIL/uL — ABNORMAL LOW (ref 4.22–5.81)
RDW: 15.8 % — ABNORMAL HIGH (ref 11.5–15.5)
WBC: 5.5 10*3/uL (ref 4.0–10.5)
nRBC: 0 % (ref 0.0–0.2)

## 2018-09-22 LAB — BASIC METABOLIC PANEL
Anion gap: 9 (ref 5–15)
BUN: 13 mg/dL (ref 8–23)
CO2: 23 mmol/L (ref 22–32)
Calcium: 9.4 mg/dL (ref 8.9–10.3)
Chloride: 112 mmol/L — ABNORMAL HIGH (ref 98–111)
Creatinine, Ser: 0.85 mg/dL (ref 0.61–1.24)
GFR calc Af Amer: >60 mL/min (ref >60)
GFR calc non Af Amer: >60 mL/min (ref >60)
Glucose, Bld: 104 mg/dL — ABNORMAL HIGH (ref 70–99)
Potassium: 3.8 mmol/L (ref 3.5–5.1)
Sodium: 144 mmol/L (ref 135–145)

## 2018-09-22 NOTE — Progress Notes (Signed)
Patient ID: Shane Mcknight, male   DOB: August 05, 1950, 68 y.o.   MRN: [id number]   Virtual Visit via video Note  This visit type was conducted due to national recommendations for restrictions regarding the COVID-19 pandemic (e.g. social distancing).  This format is felt to be most appropriate for this patient at this time.  All issues noted in this document were discussed and addressed.  No physical exam was performed (except for noted visual exam findings with Video Visits).   I connected with Angie Griffee by a video enabled telemedicine application or telephone and verified that I am speaking with the correct person using two identifiers. Location patient: home Location provider: work or home office Persons participating in the virtual visit: patient, provider  I discussed the limitations, risks, security and privacy concerns of performing an evaluation and management service by video and the availability of in person appointments. The patient expressed understanding and agreed to proceed.   Reason for visit: acute visit.  HPI: Had a heart catheterization on 09/14/18 for intermittent chest tightness.  Over the last 6 days, he has had persistent fever.  Tmax 101.4.  Taking scheduled tylenol with persistent intermittent fever - today 100.6.  Report some drainage - usual.  No headache now.  No sinus pressure or sore throat.  Does report cough.  No sob.  No abdominal pain.  Swallowing issues persistent , but stable.  No vomiting.  No change in bowels - normally loose.  Tick removed one month ago.  Rash on his knee - comes and goes.  No other rash.  He relates the rash on his knee to his psoriasis.  No change with urination.  Had an episode yesterday when he was taking the trash out, chest tight and blood pressure dropped. Called cardiology today given fever after cath and chest tightness with decreased blood pressure (82/44).   Was instructed to stop losartan.  Appetite is better now.  Trying to stay hydrated.   Is on immunosuppressive medication for treatment of his psoriatic arthritis.     ROS: See pertinent positives and negatives per HPI.  Past Medical History:  Diagnosis Date  . Abnormal liver function tests   . Allergic state   . Anemia   . Arthritis    spine, hands, shoulder with previous cuff tear  . CAD (coronary artery disease)    a.  11/2015 CT Chest/Abd: Coronary Ca2+ noted;  b. MV: EF 45-54%, med defect of mod severity in basal infsept, basal inf, mid infsept, and mid inf region-> infarct and peri-infarct ischemia-->intermediate risk; c. 05/2016 Cath/PCI: LM 40ost, LAD 30p, 52m D1 60ost, LCX 60ost, OM1 99 (small->Med Rx), RCA 60p, 95d (2.5x15 Resolute Onyx DES), RPDA/RPLB min irregs, D2 60, EF 55-65%/  . Candidiasis of esophagus ([place]   . Colon polyp   . DDD (degenerative disc disease), lumbosacral   . Dysphagia   . Elevated transaminase level   . Esophageal stricture    a. s/p multiple dilations - last 03/2016.  .Marland KitchenEsophagitis, [place] grade D   . Ganglion cyst of finger of right hand   . GERD (gastroesophageal reflux disease)   . Guaiac positive stools   . Hiatal hernia   . HNP (herniated nucleus pulposus), lumbar   . Hypercholesterolemia   . Hypertension   . Lumbar radiculitis   . Mallory-Weiss tear    a. 11/2015.  . Migraine aura without headache    migraine, visual  . Psoriasis    with psoriatic arthritis  .  Psoriatic arthritis (HCC)    severe  . Schatzki's ring   . Sleep apnea    does not uses CPAP. has lost weight so is much better.  . Type II diabetes mellitus ([place])     Past Surgical History:  Procedure Laterality Date  . ARTHROPLASTY     right thumb; left thumb with 2 screws and plastic joint  . BALLOON DILATION N/A 05/29/2015   Procedure: BALLOON DILATION;  Surgeon: Lollie Sails, MD;  Location: [facility] ENDOSCOPY;  Service: Endoscopy;  Laterality: N/A;  . BALLOON DILATION N/A 02/14/2017   Procedure: BALLOON DILATION;  Surgeon: Lollie Sails, MD;   Location: [facility] ENDOSCOPY;  Service: Endoscopy;  Laterality: N/A;  . BILATERAL CARPAL TUNNEL RELEASE Bilateral 07/04/2017   Procedure: BILATERAL CARPAL TUNNEL RELEASE;  Surgeon: Leanor Kail, MD;  Location: [facility] ORS;  Service: Orthopedics;  Laterality: Bilateral;  . BLEPHAROPLASTY    . CARDIAC CATHETERIZATION     1 stent   . CARPAL TUNNEL RELEASE  2005   right  . CHOLECYSTECTOMY  2008  . COLONOSCOPY    . COLONOSCOPY WITH PROPOFOL N/A 02/13/2018   Procedure: COLONOSCOPY WITH PROPOFOL;  Surgeon: Lollie Sails, MD;  Location: [facility] ENDOSCOPY;  Service: Endoscopy;  Laterality: N/A;  . CORONARY STENT INTERVENTION N/A 06/14/2016   Procedure: Coronary Stent Intervention;  Surgeon: Wellington Hampshire, MD;  Location: [place] CV LAB;  Service: Cardiovascular;  Laterality: N/A;  . CYST EXCISION  04/14/2015   tendon sheath cyst excision; right ring finger cyst removed and trigger finger realease  . ESOPHAGOGASTRODUODENOSCOPY    . ESOPHAGOGASTRODUODENOSCOPY (EGD) WITH PROPOFOL N/A 05/29/2015   Procedure: ESOPHAGOGASTRODUODENOSCOPY (EGD) WITH PROPOFOL;  Surgeon: Lollie Sails, MD;  Location: [facility] ENDOSCOPY;  Service: Endoscopy;  Laterality: N/A;  . ESOPHAGOGASTRODUODENOSCOPY (EGD) WITH PROPOFOL N/A 07/04/2015   Procedure: ESOPHAGOGASTRODUODENOSCOPY (EGD) WITH PROPOFOL;  Surgeon: Lollie Sails, MD;  Location: [facility] ENDOSCOPY;  Service: Endoscopy;  Laterality: N/A;  . ESOPHAGOGASTRODUODENOSCOPY (EGD) WITH PROPOFOL N/A 01/04/2016   Procedure: ESOPHAGOGASTRODUODENOSCOPY (EGD) WITH PROPOFOL;  Surgeon: Lollie Sails, MD;  Location: [facility] ENDOSCOPY;  Service: Endoscopy;  Laterality: N/A;  . ESOPHAGOGASTRODUODENOSCOPY (EGD) WITH PROPOFOL N/A 03/22/2016   Procedure: ESOPHAGOGASTRODUODENOSCOPY (EGD) WITH PROPOFOL;  Surgeon: Lollie Sails, MD;  Location: [facility] ENDOSCOPY;  Service: Endoscopy;  Laterality: N/A;  . ESOPHAGOGASTRODUODENOSCOPY (EGD) WITH PROPOFOL N/A 02/14/2017   Procedure:  ESOPHAGOGASTRODUODENOSCOPY (EGD) WITH PROPOFOL;  Surgeon: Lollie Sails, MD;  Location: [facility] ENDOSCOPY;  Service: Endoscopy;  Laterality: N/A;  . ESOPHAGOGASTRODUODENOSCOPY (EGD) WITH PROPOFOL N/A 04/11/2017   Procedure: ESOPHAGOGASTRODUODENOSCOPY (EGD) WITH PROPOFOL;  Surgeon: Lollie Sails, MD;  Location: [facility] ENDOSCOPY;  Service: Endoscopy;  Laterality: N/A;  . ESOPHAGOGASTRODUODENOSCOPY (EGD) WITH PROPOFOL N/A 07/07/2017   Procedure: ESOPHAGOGASTRODUODENOSCOPY (EGD) WITH PROPOFOL;  Surgeon: Lucilla Lame, MD;  Location: [facility] ENDOSCOPY;  Service: Endoscopy;  Laterality: N/A;  . ESOPHAGOGASTRODUODENOSCOPY (EGD) WITH PROPOFOL N/A 02/13/2018   Procedure: ESOPHAGOGASTRODUODENOSCOPY (EGD) WITH PROPOFOL;  Surgeon: Lollie Sails, MD;  Location: [facility] ENDOSCOPY;  Service: Endoscopy;  Laterality: N/A;  . EYE SURGERY    . FOREIGN BODY REMOVAL N/A 12/03/2015   Procedure: FOREIGN BODY REMOVAL;  Surgeon: Wilford Corner, MD;  Location: [facility] ENDOSCOPY;  Service: Endoscopy;  Laterality: N/A;  . HERNIA REPAIR Right 1970   inguinal  . INGUINAL HERNIA REPAIR Left 01/01/2018   Procedure: OPEN HERNIA REPAIR INGUINAL ADULT;  Surgeon: Olean Ree, MD;  Location: [facility] ORS;  Service: General;  Laterality: Left;  . LEFT HEART CATH Left 06/14/2016  Procedure: Left Heart Cath;  Surgeon: Wellington Hampshire, MD;  Location: [facility] CV LAB;  Service: Cardiovascular;  Laterality: Left;  . LEFT HEART CATH AND CORONARY ANGIOGRAPHY Left 09/14/2018   Procedure: LEFT HEART CATH AND CORONARY ANGIOGRAPHY;  Surgeon: Wellington Hampshire, MD;  Location: [place] CV LAB;  Service: Cardiovascular;  Laterality: Left;  . LUMBAR DISC SURGERY  2004   injections; herniated disc; percutaneous discectomy  . NASAL SINUS SURGERY  2008  . percutaneous lumbar discectomy    . ROTATOR CUFF REPAIR Right   . TONSILLECTOMY      Family History  Problem Relation Age of Onset  . Lung cancer Father   . Cancer Father   .  Hypertension Mother   . Aneurysm Son     SOCIAL HX: reviewed.    Current Outpatient Medications:  .  acetaminophen (TYLENOL) 500 MG tablet, Take 1,000 mg by mouth daily as needed for moderate pain., Disp: , Rfl:  .  aspirin EC 81 MG tablet, Take 81 mg by mouth every evening. , Disp: , Rfl:  .  atenolol (TENORMIN) 50 MG tablet, TAKE 1 TABLET BY MOUTH  TWICE A DAY, Disp: 180 tablet, Rfl: 1 .  COSENTYX SENSOREADY PEN 150 MG/ML SOAJ, Inject 300 mg into the skin every 30 (thirty) days. , Disp: , Rfl: 10 .  cyanocobalamin (,VITAMIN B-12,) 1000 MCG/ML injection, Inject 1 mL (1,000 mcg total) into the muscle every 30 (thirty) days., Disp: 10 mL, Rfl: 1 .  folic acid (FOLVITE) 1 MG tablet, Take 1 mg by mouth every evening. , Disp: , Rfl:  .  gabapentin (NEURONTIN) 300 MG capsule, Take 1-2 capsules q hs (Patient taking differently: Take 300-600 mg by mouth at bedtime as needed (pain). Take 1-2 capsules prn), Disp: 180 capsule, Rfl: 1 .  isosorbide mononitrate (IMDUR) 30 MG 24 hr tablet, Take 1 tablet (30 mg total) by mouth daily., Disp: 30 tablet, Rfl: 6 .  metFORMIN (GLUCOPHAGE) 500 MG tablet, TAKE 2 TABLETS BY MOUTH 2  TIMES DAILY WITH MEALS. (Patient taking differently: Take 1,000 mg by mouth 2 (two) times daily with a meal. ), Disp: 360 tablet, Rfl: 1 .  methotrexate (RHEUMATREX) 2.5 MG tablet, Take 20 mg by mouth once a week. Take 8 tablets every Friday., Disp: , Rfl:  .  Multiple Vitamin (MULTIVITAMIN) tablet, Take 1 tablet by mouth daily., Disp: , Rfl:  .  nitroGLYCERIN (NITROSTAT) 0.4 MG SL tablet, Place 1 tablet (0.4 mg total) under the tongue every 5 (five) minutes as needed. MAXIMUM OF 3 DOSES., Disp: 25 tablet, Rfl: 6 .  pantoprazole (PROTONIX) 40 MG tablet, TAKE 1 TABLET BY MOUTH  TWICE A DAY, Disp: 180 tablet, Rfl: 1 .  rosuvastatin (CRESTOR) 10 MG tablet, Take 1 tablet (10 mg total) by mouth daily., Disp: 90 tablet, Rfl: 0 .  SYRINGE-NEEDLE, DISP, 3 ML 25G X 5/8" 3 ML MISC, Use as  instructed with B12 injection., Disp: 50 each, Rfl: 11 .  triamcinolone cream (KENALOG) 0.5 %, Apply 1 application topically 2 (two) times daily as needed (scaling)., Disp: , Rfl:  .  vitamin E 400 UNIT capsule, Take 400 Units by mouth every evening., Disp: , Rfl:   EXAM:  VITALS per patient if applicable:  672.0, 947/09, 85  GENERAL: alert, oriented, no acute distress.    HEENT: atraumatic, conjunttiva clear, no obvious abnormalities on inspection of external nose and ears  NECK: normal movements of the head and neck  LUNGS: on inspection  no signs of respiratory distress, breathing rate appears normal, no obvious gross SOB, gasping or wheezing  CV: no obvious cyanosis  PSYCH/NEURO: pleasant and cooperative, no obvious depression or anxiety, speech and thought processing grossly intact  ASSESSMENT AND PLAN:  Discussed the following assessment and plan:  Fever Unclear etiology.  COVID test pending.  With cough, intermittent chest tightness.  Recent heart catheterization.  States site looks good.  Persistent fever now for 6 days - on atc tylenol.  Needs further testing, i.e, blood cultures, cbc, metabolic panel and cxr.  Discussed with him.  Concern given on immunosuppressive agent to treat his psoriatic arthritis.  Episode in the last 24 hours of chest tightness with associated hypotension.  Discussed ER evaluation.  He was in agreement.  To ER for further testing and evaluation.  ER notified.     Dysphagia Persistent issue.  Unchanged.  Has to get cardiac issues sorted through before further testing.    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, without long-term current use of insulin (HCC) Sugars have been under good control.  Follow met b and a1c.    CAD (coronary artery disease) Previous stent RCA.  Did well until approximately 10 days ago.  With intermittent chest tightness with exertion and associated hypotension, had recent heart cath.   Followed by cardiology - Dr Fletcher Anon.  Losartan stopped today.  With fever, cough, etc now.  To ER for further evaluation and testing.    Psoriatic arthritis Followed by Dr Jefm Bryant.  On immunosuppressive agent.  With fever, etc - to ER for further testing and evaluation.      I discussed the assessment and treatment plan with the patient. The patient was provided an opportunity to ask questions and all were answered. The patient agreed with the plan and demonstrated an understanding of the instructions.   The patient was advised to call back or seek an in-person evaluation if the symptoms worsen or if the condition fails to improve as anticipated.   Einar Pheasant, MD

## 2018-09-22 NOTE — Telephone Encounter (Signed)
I spoke with the patient regarding Christell Faith, PA's recommendations to stop losartan, but continue atenolol & imdur.  He is aware if symptoms persist, then he we may need to consider a stress test to further evaluate.  I have advised him we will call him back on Thursday to follow up.  Per the patient, his BP's are ~ 99991111 systolic, but will drop with exercise.  He states he went for a COVID swab today and is currently awaiting Dr. Nicki Reaper to call him back.   I have notified Christell Faith, PA verbally of the above information.  He is aware I will call the patient back on Thursday.  Per Thurmond Butts, he has advised the patient to stay out of work this week.

## 2018-09-22 NOTE — Assessment & Plan Note (Signed)
Sugars have been under good control.  Follow met b and a1c.

## 2018-09-22 NOTE — Telephone Encounter (Signed)
Pt c/o of Chest Pain: STAT if CP now or developed within 24 hours  1. Are you having CP right now? No  2. Are you experiencing any other symptoms (ex. SOB, nausea, vomiting, sweating)?  Weakness after walking to get his trash can  3. How long have you been experiencing CP?  Just yesterday  4. Is your CP continuous or coming and going? Continual after exertion.  5. Have you taken Nitroglycerin?   No ? bp yesterday 91/54, HR 80

## 2018-09-22 NOTE — Assessment & Plan Note (Signed)
Unclear etiology.  COVID test pending.  With cough, intermittent chest tightness.  Recent heart catheterization.  States site looks good.  Persistent fever now for 6 days - on atc tylenol.  Needs further testing, i.e, blood cultures, cbc, metabolic panel and cxr.  Discussed with him.  Concern given on immunosuppressive agent to treat his psoriatic arthritis.  Episode in the last 24 hours of chest tightness with associated hypotension.  Discussed ER evaluation.  He was in agreement.  To ER for further testing and evaluation.  ER notified.

## 2018-09-22 NOTE — Telephone Encounter (Signed)
Agree with recommendation for patient to follow-up with PCP regarding fever.  With regards to symptoms and BP, recommend he stop losartan in an effort to allow for higher blood pressure.  Would ideally prefer to keep beta-blocker and long-acting nitrate on board for antianginal effect.  If symptoms persist and to follow-up despite optimization of medical therapy and/or limitation by BP we can consider stress testing to evaluate for clinically significant ischemia as noted in the cath report.

## 2018-09-22 NOTE — Assessment & Plan Note (Signed)
Persistent issue.  Unchanged.  Has to get cardiac issues sorted through before further testing.

## 2018-09-22 NOTE — Assessment & Plan Note (Signed)
Followed by Dr Jefm Bryant.  On immunosuppressive agent.  With fever, etc - to ER for further testing and evaluation.

## 2018-09-22 NOTE — Telephone Encounter (Signed)
Patient called in to office because he was advised by cardiology to consult with PCP about low grade fever he has been running the last several days. He had heart cath done on 8/31. Has been running a low grade fever of 100-101 the last several days. Fever stays about 99 with tylenol. Pt confirmed no other symptoms of being sick but did notice some chest tightness and SOB with exertion one day this past weekend after he had went outside. Pt noted no signs of infection at his incisions. Does not feel bad but was supposed to return to work this morning and was not able to due to fever. Pt stated that he reached out to cardiology thinking that it could be related to recent procedure. A message was sent to Dr Fletcher Anon as well.

## 2018-09-22 NOTE — Assessment & Plan Note (Signed)
Previous stent RCA.  Did well until approximately 10 days ago.  With intermittent chest tightness with exertion and associated hypotension, had recent heart cath.  Followed by cardiology - Dr Fletcher Anon.  Losartan stopped today.  With fever, cough, etc now.  To ER for further evaluation and testing.

## 2018-09-22 NOTE — ED Provider Notes (Signed)
[facility] Emergency Department Provider Note  ____________________________________________   First MD Initiated Contact with Patient 09/22/18 2303     (approximate)  I have reviewed the triage vital signs and the nursing notes.   HISTORY  Chief Complaint Fever and Chest Pain    HPI Shane Mcknight is a 68 y.o. male with medical history as listed below which notably includes psoriasis for which he is on immunosuppressive medication and  who had a heart catheterization by Dr. Fletcher Anon about 8 days ago.  He presents tonight for persistent fevers that are been going on for about a week, starting approximately 2 days after the catheterization.  He has had a fever every day and especially in the evening as high as 101.8.  He has not had any other significant symptoms.  He has had a persistent mild cough that is occasionally productive that is been going on for a couple of weeks.  He has been having episodes of chest tightness which was the reason for the catheterization and that has not changed.  He was found to have significant multivessel disease but did not require stent placement and his medications were optimized.  He had an episode of low blood pressure within the last couple of days and was told to stop taking his losartan.  His symptoms have been gradual in onset and persistent, nothing in particular seems to make them better or worse.  He has had no contact with COVID-19 patients and denies chest pain, just the intermittent episodes of tightness.  He denies sore throat, nausea, vomiting, abdominal pain, dysuria, and he denies any rash or swelling or signs of infection at the catheterization site.  He had a virtual visit with his primary care doctor today and he had a COVID-19 test sent, but she recommended he come to the ED for further evaluation.        Past Medical History:  Diagnosis Date  . Abnormal liver function tests   . Allergic state   . Anemia   .  Arthritis    spine, hands, shoulder with previous cuff tear  . CAD (coronary artery disease)    a.  11/2015 CT Chest/Abd: Coronary Ca2+ noted;  b. MV: EF 45-54%, med defect of mod severity in basal infsept, basal inf, mid infsept, and mid inf region-> infarct and peri-infarct ischemia-->intermediate risk; c. 05/2016 Cath/PCI: LM 40ost, LAD 30p, 21m, D1 60ost, LCX 60ost, OM1 99 (small->Med Rx), RCA 60p, 95d (2.5x15 Resolute Onyx DES), RPDA/RPLB min irregs, D2 60, EF 55-65%/  . Candidiasis of esophagus ([place])   . Colon polyp   . DDD (degenerative disc disease), lumbosacral   . Dysphagia   . Elevated transaminase level   . Esophageal stricture    a. s/p multiple dilations - last 03/2016.  Marland Kitchen Esophagitis, [place] grade D   . Ganglion cyst of finger of right hand   . GERD (gastroesophageal reflux disease)   . Guaiac positive stools   . Hiatal hernia   . HNP (herniated nucleus pulposus), lumbar   . Hypercholesterolemia   . Hypertension   . Lumbar radiculitis   . Mallory-Weiss tear    a. 11/2015.  . Migraine aura without headache    migraine, visual  . Psoriasis    with psoriatic arthritis  . Psoriatic arthritis (HCC)    severe  . Schatzki's ring   . Sleep apnea    does not uses CPAP. has lost weight so is much better.  Marland Kitchen  Type II diabetes mellitus [facility])     Patient Active Problem List   Diagnosis Date Noted  . Fever 09/22/2018  . Reducible left inguinal hernia 11/03/2017  . Stricture and stenosis of esophagus   . Pre-op evaluation 06/30/2017  . Bilateral hand numbness 02/26/2017  . Need for pneumococcal vaccination 11/22/2016  . CAD (coronary artery disease) 06/14/2016  . Hyperlipidemia 06/14/2016  . Abnormal stress test   . Effort angina ([place])   . Dizziness 02/11/2016  . Food impaction of esophagus 12/03/2015  . Dysphagia 12/03/2015  . Left shoulder pain 08/13/2015  . Anemia 08/13/2015  . Right groin pain 04/15/2015  . Health care maintenance 07/10/2014  . Sinusitis  03/11/2014  . History of colonic polyps 09/10/2013  . Elevated PSA measurement 04/25/2013  . Skin lesion 04/12/2013  . Rectal bleeding 04/12/2013  . Obstructive sleep apnea 03/16/2012  . 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 Hypertension 01/30/2012  . Psoriatic arthritis ([place]) 01/30/2012  . Abnormal liver function tests 01/30/2012  . GERD (gastroesophageal reflux disease) 01/30/2012    Past Surgical History:  Procedure Laterality Date  . ARTHROPLASTY     right thumb; left thumb with 2 screws and plastic joint  . BALLOON DILATION N/A 05/29/2015   Procedure: BALLOON DILATION;  Surgeon: Lollie Sails, MD;  Location: [facility] ENDOSCOPY;  Service: Endoscopy;  Laterality: N/A;  . BALLOON DILATION N/A 02/14/2017   Procedure: BALLOON DILATION;  Surgeon: Lollie Sails, MD;  Location: [facility] ENDOSCOPY;  Service: Endoscopy;  Laterality: N/A;  . BILATERAL CARPAL TUNNEL RELEASE Bilateral 07/04/2017   Procedure: BILATERAL CARPAL TUNNEL RELEASE;  Surgeon: Leanor Kail, MD;  Location: [facility] ORS;  Service: Orthopedics;  Laterality: Bilateral;  . BLEPHAROPLASTY    . CARDIAC CATHETERIZATION     1 stent   . CARPAL TUNNEL RELEASE  2005   right  . CHOLECYSTECTOMY  2008  . COLONOSCOPY    . COLONOSCOPY WITH PROPOFOL N/A 02/13/2018   Procedure: COLONOSCOPY WITH PROPOFOL;  Surgeon: Lollie Sails, MD;  Location: [facility] ENDOSCOPY;  Service: Endoscopy;  Laterality: N/A;  . CORONARY STENT INTERVENTION N/A 06/14/2016   Procedure: Coronary Stent Intervention;  Surgeon: Wellington Hampshire, MD;  Location: [place] CV LAB;  Service: Cardiovascular;  Laterality: N/A;  . CYST EXCISION  04/14/2015   tendon sheath cyst excision; right ring finger cyst removed and trigger finger realease  . ESOPHAGOGASTRODUODENOSCOPY    . ESOPHAGOGASTRODUODENOSCOPY (EGD) WITH PROPOFOL N/A 05/29/2015   Procedure:  ESOPHAGOGASTRODUODENOSCOPY (EGD) WITH PROPOFOL;  Surgeon: Lollie Sails, MD;  Location: [facility] ENDOSCOPY;  Service: Endoscopy;  Laterality: N/A;  . ESOPHAGOGASTRODUODENOSCOPY (EGD) WITH PROPOFOL N/A 07/04/2015   Procedure: ESOPHAGOGASTRODUODENOSCOPY (EGD) WITH PROPOFOL;  Surgeon: Lollie Sails, MD;  Location: [facility] ENDOSCOPY;  Service: Endoscopy;  Laterality: N/A;  . ESOPHAGOGASTRODUODENOSCOPY (EGD) WITH PROPOFOL N/A 01/04/2016   Procedure: ESOPHAGOGASTRODUODENOSCOPY (EGD) WITH PROPOFOL;  Surgeon: Lollie Sails, MD;  Location: [facility] ENDOSCOPY;  Service: Endoscopy;  Laterality: N/A;  . ESOPHAGOGASTRODUODENOSCOPY (EGD) WITH PROPOFOL N/A 03/22/2016   Procedure: ESOPHAGOGASTRODUODENOSCOPY (EGD) WITH PROPOFOL;  Surgeon: Lollie Sails, MD;  Location: [facility] ENDOSCOPY;  Service: Endoscopy;  Laterality: N/A;  . ESOPHAGOGASTRODUODENOSCOPY (EGD) WITH PROPOFOL N/A 02/14/2017   Procedure: ESOPHAGOGASTRODUODENOSCOPY (EGD) WITH PROPOFOL;  Surgeon: Lollie Sails, MD;  Location: [facility] ENDOSCOPY;  Service: Endoscopy;  Laterality: N/A;  . ESOPHAGOGASTRODUODENOSCOPY (EGD) WITH PROPOFOL N/A 04/11/2017   Procedure: ESOPHAGOGASTRODUODENOSCOPY (EGD) WITH PROPOFOL;  Surgeon: Lollie Sails, MD;  Location: [facility] ENDOSCOPY;  Service: Endoscopy;  Laterality: N/A;  . ESOPHAGOGASTRODUODENOSCOPY (EGD) WITH PROPOFOL N/A 07/07/2017   Procedure: ESOPHAGOGASTRODUODENOSCOPY (EGD) WITH PROPOFOL;  Surgeon: Lucilla Lame, MD;  Location: [facility] ENDOSCOPY;  Service: Endoscopy;  Laterality: N/A;  . ESOPHAGOGASTRODUODENOSCOPY (EGD) WITH PROPOFOL N/A 02/13/2018   Procedure: ESOPHAGOGASTRODUODENOSCOPY (EGD) WITH PROPOFOL;  Surgeon: Lollie Sails, MD;  Location: [facility] ENDOSCOPY;  Service: Endoscopy;  Laterality: N/A;  . EYE SURGERY    . FOREIGN BODY REMOVAL N/A 12/03/2015   Procedure: FOREIGN BODY REMOVAL;  Surgeon: Wilford Corner, MD;  Location: [facility] ENDOSCOPY;  Service: Endoscopy;  Laterality: N/A;  . HERNIA REPAIR Right 1970    inguinal  . INGUINAL HERNIA REPAIR Left 01/01/2018   Procedure: OPEN HERNIA REPAIR INGUINAL ADULT;  Surgeon: Olean Ree, MD;  Location: [facility] ORS;  Service: General;  Laterality: Left;  . LEFT HEART CATH Left 06/14/2016   Procedure: Left Heart Cath;  Surgeon: Wellington Hampshire, MD;  Location: [place] CV LAB;  Service: Cardiovascular;  Laterality: Left;  . LEFT HEART CATH AND CORONARY ANGIOGRAPHY Left 09/14/2018   Procedure: LEFT HEART CATH AND CORONARY ANGIOGRAPHY;  Surgeon: Wellington Hampshire, MD;  Location: [place] CV LAB;  Service: Cardiovascular;  Laterality: Left;  . LUMBAR DISC SURGERY  2004   injections; herniated disc; percutaneous discectomy  . NASAL SINUS SURGERY  2008  . percutaneous lumbar discectomy    . ROTATOR CUFF REPAIR Right   . TONSILLECTOMY      Prior to Admission medications   Medication Sig Start Date End Date Taking? Authorizing Provider  acetaminophen (TYLENOL) 500 MG tablet Take 1,000 mg by mouth daily as needed for moderate pain.    [provider]  aspirin EC 81 MG tablet Take 81 mg by mouth every evening.     [provider]  atenolol (TENORMIN) 50 MG tablet TAKE 1 TABLET BY MOUTH  TWICE A DAY 08/03/18   Einar Pheasant, MD  COSENTYX SENSOREADY PEN 150 MG/ML SOAJ Inject 300 mg into the skin every 30 (thirty) days.  04/06/15   [provider]  cyanocobalamin (,VITAMIN B-12,) 1000 MCG/ML injection Inject 1 mL (1,000 mcg total) into the muscle every 30 (thirty) days. 09/01/18   Einar Pheasant, MD  folic acid (FOLVITE) 1 MG tablet Take 1 mg by mouth every evening.     [provider]  gabapentin (NEURONTIN) 300 MG capsule Take 1-2 capsules q hs Patient taking differently: Take 300-600 mg by mouth at bedtime as needed (pain). Take 1-2 capsules prn 07/24/18   Einar Pheasant, MD  isosorbide mononitrate (IMDUR) 30 MG 24 hr tablet Take 1 tablet (30 mg total) by mouth daily. 09/14/18 09/14/19  Wellington Hampshire, MD  metFORMIN  (GLUCOPHAGE) 500 MG tablet TAKE 2 TABLETS BY MOUTH 2  TIMES DAILY WITH MEALS. Patient taking differently: Take 1,000 mg by mouth 2 (two) times daily with a meal.  08/03/18   Einar Pheasant, MD  methotrexate (RHEUMATREX) 2.5 MG tablet Take 20 mg by mouth once a week. Take 8 tablets every Friday. 02/26/18   [provider]  Multiple Vitamin (MULTIVITAMIN) tablet Take 1 tablet by mouth daily.    [provider]  nitroGLYCERIN (NITROSTAT) 0.4 MG SL tablet Place 1 tablet (0.4 mg total) under the tongue every 5 (five) minutes as needed. MAXIMUM OF 3 DOSES. 07/02/17   Theora Gianotti, NP  pantoprazole (PROTONIX) 40 MG tablet TAKE 1 TABLET BY MOUTH  TWICE A DAY  06/05/18   Einar Pheasant, MD  rosuvastatin (CRESTOR) 10 MG tablet Take 1 tablet (10 mg total) by mouth daily. 07/24/18   Einar Pheasant, MD  SYRINGE-NEEDLE, DISP, 3 ML 25G X 5/8" 3 ML MISC Use as instructed with B12 injection. 08/08/17   Einar Pheasant, MD  triamcinolone cream (KENALOG) 0.5 % Apply 1 application topically 2 (two) times daily as needed (scaling).    [provider]  vitamin E 400 UNIT capsule Take 400 Units by mouth every evening.    [provider]    Allergies Ephedrine, Nsaids, Vancomycin, Amoxicillin, Levsin [hyoscyamine sulfate], Naprosyn [naproxen], Other, Penicillins, Shellfish allergy, and Sudafed [pseudoephedrine hcl]  Family History  Problem Relation Age of Onset  . Lung cancer Father   . Cancer Father   . Hypertension Mother   . Aneurysm Son     Social History Social History   Tobacco Use  . Smoking status: Former Smoker    Types: Cigarettes    Quit date: 07/06/1982    Years since quitting: 36.2  . Smokeless tobacco: Never Used  Substance Use Topics  . Alcohol use: Yes    Alcohol/week: 0.0 standard drinks  . Drug use: No    Review of Systems Constitutional: +fever Eyes: No visual changes. ENT: No sore throat. Cardiovascular: Episodes of chest tightness as  described above. Respiratory: Denies shortness of breath.  Intermittent mild occasionally productive cough for about 2 weeks. Gastrointestinal: No abdominal pain.  No nausea, no vomiting.  No diarrhea.  No constipation. Genitourinary: Negative for dysuria. Musculoskeletal: Negative for neck pain.  Negative for back pain. Integumentary: Negative for rash. Neurological: Negative for headaches, focal weakness or numbness.   ____________________________________________   PHYSICAL EXAM:  VITAL SIGNS: ED Triage Vitals [09/22/18 1845]  Enc Vitals Group     BP 119/88     Pulse Rate 81     Resp 16     Temp 98.9 F (37.2 C)     Temp Source Oral     SpO2 95 %     Weight 82.6 kg (182 lb)     Height 1.727 m (5\' 8" )     Head Circumference      Peak Flow      Pain Score 0     Pain Loc      Pain Edu?      Excl. in [place]?     Constitutional: Alert and oriented.  Generally well-appearing and in no distress. Eyes: Conjunctivae are normal.  Head: Atraumatic. Nose: No congestion/rhinnorhea. Mouth/Throat: Mucous membranes are moist. Neck: No stridor.  No meningeal signs.   Cardiovascular: Normal rate, regular rhythm. Good peripheral circulation. Grossly normal heart sounds. Respiratory: Normal respiratory effort.  No retractions. Gastrointestinal: Soft and nontender. No distention.  Musculoskeletal: No lower extremity tenderness nor edema. No gross deformities of extremities. Neurologic:  Normal speech and language. No gross focal neurologic deficits are appreciated.  Skin:  Skin is warm, dry and intact.  Scattered patchy psoriasis, no evidence of cellulitis. Psychiatric: Mood and affect are normal. Speech and behavior are normal.  ____________________________________________   LABS (all labs ordered are listed, but only abnormal results are displayed)  Labs Reviewed  BASIC METABOLIC PANEL - Abnormal; Notable for the following components:      Result Value   Chloride 112 (*)     Glucose, Bld 104 (*)    All other components within normal limits  CBC - Abnormal; Notable for the following components:   RBC 3.65 (*)  Hemoglobin 11.7 (*)    HCT 34.5 (*)    RDW 15.8 (*)    All other components within normal limits  URINALYSIS, ROUTINE W REFLEX MICROSCOPIC - Abnormal; Notable for the following components:   Color, Urine YELLOW (*)    APPearance CLEAR (*)    Specific Gravity, Urine 1.031 (*)    Ketones, ur 5 (*)    All other components within normal limits  URINE CULTURE  CULTURE, BLOOD (ROUTINE X 2)  CULTURE, BLOOD (ROUTINE X 2)  PROCALCITONIN  TROPONIN I (HIGH SENSITIVITY)  TROPONIN I (HIGH SENSITIVITY)   ____________________________________________  EKG  ED ECG REPORT I, Hinda Kehr, the attending physician, personally viewed and interpreted this ECG.  Date: 09/22/2018 EKG Time: 18: 52 Rate: 71 Rhythm: normal sinus rhythm QRS Axis: normal Intervals: normal ST/T Wave abnormalities: normal Narrative Interpretation: no evidence of acute ischemia  ____________________________________________  RADIOLOGY I, Hinda Kehr, personally viewed and evaluated these images (plain radiographs) as part of my medical decision making, as well as reviewing the written report by the radiologist.  ED MD interpretation: As per radiology, suboptimal inspiration likely accounts for the mild atelectasis in the left lower lobe.  Official radiology report(s): Dg Chest 2 View  Result Date: 09/22/2018 CLINICAL DATA:  Approximate one-week history of fever and chest tightness. EXAM: CHEST - 2 VIEW COMPARISON:  06/27/2017 and earlier. FINDINGS: Cardiomediastinal silhouette unremarkable and unchanged. Suboptimal inspiration which accounts for mild atelectasis in the LEFT LOWER LOBE. Lungs otherwise clear. Bronchovascular markings normal. No localized airspace consolidation. No pleural effusions. No pneumothorax. Normal pulmonary vascularity. Degenerative changes involving the  thoracic and visualized lumbar spine. IMPRESSION: Suboptimal inspiration accounts for mild atelectasis in the LEFT LOWER LOBE. No acute cardiopulmonary disease otherwise. Electronically Signed   By: Evangeline Dakin M.D.   On: 09/22/2018 20:12    ____________________________________________   PROCEDURES   Procedure(s) performed (including Critical Care):  Procedures   ____________________________________________   INITIAL IMPRESSION / MDM / [place] / ED COURSE  As part of my medical decision making, I reviewed the following data within the [place] History obtained from family, Nursing notes reviewed and incorporated, Labs reviewed , EKG interpreted , Old chart reviewed, Radiograph reviewed  and Notes from prior ED visits   Differential diagnosis includes, but is not limited to, nonspecific viral infection, COVID-19, bacteremia, urinary tract infection, pneumonia.  The patient is well-appearing in no distress with reassuring vital signs.  He initially had a temperature of 100.4 but it resolved without intervention.  His oxygen saturation has been appropriate in the mid 90s and he does not have any tachycardia.  He is in no respiratory distress and his respiratory symptoms have not changed for the last couple of weeks.  He had a negative COVID-19 test prior to his cardiac catheterization.  He has no other specific signs or symptoms of infection.  His metabolic panel and CBC are all reassuring.  I discussed the plan with the patient and explained that he does not need to stay in the hospital which he found reassuring because he does not want to be here.  I am checking a urinalysis and blood cultures.  He does not require additional COVID-19 testing because an outpatient results is already pending and he should have the results in a day or 2.  He knows that he will be contacted if his cultures come back positive.  I gave my usual customary return precautions should  he develop new or worsening  symptoms and he understands and agrees with the plan.  Of note, although radiology mentioned some atelectasis in the left lower lobe, which theoretically could represent a developing lobar pneumonia, I think it is unlikely based on his reassuring vital signs, his level of comfort, and his overall reassuring work-up.  I will not start him on empiric antibiotics but he knows to return to the ED if his symptoms worsen.      Clinical Course as of Sep 23 43  Wed Sep 23, 2018  0032 No evidence of acute infection in the urine.  Will proceed with discharge as planned.  Discussed with patient who is comfortable with the plan and is currently asymptomatic.  Urinalysis, Routine w reflex microscopic(!) [CF]  0044 Procalcitonin: <0.10 [CF]    Clinical Course User Index [CF] Hinda Kehr, MD     ____________________________________________  FINAL CLINICAL IMPRESSION(S) / ED DIAGNOSES  Final diagnoses:  Fever, unspecified fever cause     MEDICATIONS GIVEN DURING THIS VISIT:  Medications - No data to display   ED Discharge Orders    None      *Please note:  Shane Mcknight was evaluated in Emergency Department on 09/23/2018 for the symptoms described in the history of present illness. He was evaluated in the context of the global COVID-19 pandemic, which necessitated consideration that the patient might be at risk for infection with the SARS-CoV-2 virus that causes COVID-19. Institutional protocols and algorithms that pertain to the evaluation of patients at risk for COVID-19 are in a state of rapid change based on information released by regulatory bodies including the CDC and federal and state organizations. These policies and algorithms were followed during the patient's care in the ED.  Some ED evaluations and interventions may be delayed as a result of limited staffing during the pandemic.*  Note:  This document was prepared using Dragon voice recognition software  and may include unintentional dictation errors.   Hinda Kehr, MD 09/23/18 [phone]

## 2018-09-22 NOTE — ED Triage Notes (Signed)
Pt in via POV, sent here per PCP due to ongoing fever.  Pt reports cardiac cath 8/31 w/ fever beginning 2 days later.  Pt also reports same chest tightness that he was experiencing prior to catheterization.  Ambulatory to triage, NAD noted at this time.

## 2018-09-22 NOTE — Telephone Encounter (Signed)
Can schedule doxy to discuss.  Also see if agreeable to covid test and order

## 2018-09-22 NOTE — Telephone Encounter (Signed)
Spoke with the patient. Patient sts that yesterday after bringing his trash tot the curb. He experienced an episode of chest tightness and weakness. The patient denies sob, palpitations, diaphoresis, n/v. The chest tightness was relieved with rest. Patient checked his BP right after 91/54 80bpm. His BP has been running under [id number] systolic. He also reports a low grad fever 100-101 F over the last several days. He has been taking Tylenol for several days. His temperature this morning 99.62F. He denies  Other COVID symptoms (sore throat, loss in taste, sob) His cath site is dry and intact with no sign of infection. He was to return to work today, but has not due to the episode yesterday and his low grade temp.  Patient is taking: Losartan 50mg  daily Atenolol 50mg  bid Imdur 30mg  daily.   Adv the patient to contact his pcp asap regarding the low grade temperature that he has had for several days.  Adv the patient that I will fwd an update to Dr. Fletcher Anon regarding the episode of chest tightness, weakness and low BP.

## 2018-09-22 NOTE — Telephone Encounter (Signed)
Pt scheduled and will go to [facility] this afternoon prior to appt to be tested

## 2018-09-23 ENCOUNTER — Telehealth: Payer: Self-pay

## 2018-09-23 ENCOUNTER — Ambulatory Visit: Payer: Medicare Other | Admitting: Nurse Practitioner

## 2018-09-23 ENCOUNTER — Encounter: Payer: Self-pay | Admitting: Internal Medicine

## 2018-09-23 LAB — URINE CULTURE
Culture: NO GROWTH
Special Requests: NORMAL

## 2018-09-23 LAB — URINALYSIS, ROUTINE W REFLEX MICROSCOPIC
Bilirubin Urine: NEGATIVE
Glucose, UA: NEGATIVE mg/dL
Hgb urine dipstick: NEGATIVE
Ketones, ur: 5 mg/dL — AB
Leukocytes,Ua: NEGATIVE
Nitrite: NEGATIVE
Protein, ur: NEGATIVE mg/dL
Specific Gravity, Urine: 1.031 — ABNORMAL HIGH (ref 1.005–1.030)
pH: 5 (ref 5.0–8.0)

## 2018-09-23 LAB — PROCALCITONIN: Procalcitonin: <0.1 ng/mL

## 2018-09-23 LAB — NOVEL CORONAVIRUS, NAA: SARS-CoV-2, NAA: NOT DETECTED

## 2018-09-23 NOTE — Telephone Encounter (Signed)
Copied from [place] [id number]. Topic: General - Other >> Sep 23, 2018 11:06 AM Keene Breath wrote: Reason for CRM: Patient called to ask for a note for his employer regarding some health issues he has and regarding the COVID testing.  Patient stated that it can be put on My Chart so he can print it out.  Please call to discuss.  CB# [phone]

## 2018-09-23 NOTE — Telephone Encounter (Signed)
Patient is needing letter stating that he needs to be out of work due to medical issues and COVID testing.

## 2018-09-23 NOTE — Telephone Encounter (Signed)
Pt has letter via my chart

## 2018-09-23 NOTE — Telephone Encounter (Signed)
Letter printed. Thanks!

## 2018-09-23 NOTE — Progress Notes (Deleted)
Cardiology Office Note    Date:  09/23/2018   ID:  [name], DOB 02-01-1950, MRN [id number]  PCP:  Einar Pheasant, MD  Cardiologist:  Kathlyn Sacramento, MD  Electrophysiologist:  None   Chief Complaint: Follow up  History of Present Illness:   Shane Mcknight is a 68 y.o. male with history of CAD status post PCI/DES to the RCA in 06/2016, DM2, HTN, HLD, carotid artery disease with 1 to 39% RICA stenosis and 40 to [id number] LICA stenosis from [id number], sleep apnea, hiatal hernia, esophageal stricture, Mallory-Weiss tear, Schatzki ring, GERD, anemia, and bilateral carpal tunnel syndrome status post carpal tunnel release in 2019 without incident who presents for follow-up of diagnostic cath.  Patient underwent stress testing in 05/2016 that was abnormal which was followed up by a LHC in 06/2016 that showed severe 2-vessel CAD involving the RCA and OM1 as outlined below. Cath details: ostial left main 40%, proximal LAD 30%, mid LAD 50%, D1 60%, ostial LCx 60%, OM1 99%, proximal RCA 60%, distal RCA 95%, 2nd RPLB 60%. He underwent successful PCI/DES to the distalRCA. The subtotal occlusion of the OM1 was medically managed given this was a small vessel of less than 2 mm in diameter and supplied a small area. There was no pressure dampening with engagement of the left main. Normal LVSF with normal LVEDP.  He was seen virtually in 06/2018 and doing well from a cardiac perspective.  More recently, he was seen in the office in late 08/2018 noting a 1 week history of exertional substernal chest heaviness with associated fatigue, weakness, and lightheadedness that resolved with rest.  Given this, he underwent diagnostic cath on 09/14/2018 which showed a patent distal RCA stent with no significant restenosis as well as stable moderate disease in the mid LAD and proximal RCA.  OM1 was now completely occluded (this was 99% stenosis previously though overall a small branch).  The only difference from his most recent cardiac cath  was progression of distal RCA disease just proximal to the stent and a tortuous segment causing approximately 60% stenosis.  LV systolic function was normal with a mildly elevated LVEDP.  There was no clear culprit for the patient's anginal symptoms though he was noted to have moderate three-vessel CAD.  Recommendation was made to optimize medical therapy and to proceed with stress testing down the road to evaluate for ischemia in the RCA or LAD distributions in spite of optimization of medical therapy.  Following his cardiac cath, he called requesting to go back to work prior to his office visit later this month and stated he was doing well without any symptoms concerning for angina.  In this setting work note was provided.  However, he again contacted our office on 09/22/2018 indicating return of exertional chest discomfort and weakness while bringing in his trash cans with associated hypotension with BP in the 90s over 50s.  There was also patient reported fever with a T-max of 101.8.  It was recommended he follow-up with his PCP that day regarding his fever.  Given his hypotension it was recommended he discontinue losartan.  Patient saw PCP virtually on 9/8 with recommendation to undergo COVID testing and proceed to the ED.  Work-up in the ED was relatively unrevealing with high-sensitivity troponin 32, normal PCT, potassium 3.8, serum creatinine 0.85, WBC 5.5, Hgb 11.7, PLT 196, blood culture no growth to date x2, chest x-ray showing mild atelectasis in the left lower lobe, and EKG showing sinus rhythm with no  acute ST-T changes.  COVID testing remains pending at this time.  ***   Labs: As above 06/2018 - total cholesterol 87, triglyceride 178, HDL 31, LDL 20, AST/ALT normal, albumin 4.4, A1c 6.2 02/2018 - TSH normal   Past Medical History:  Diagnosis Date   Abnormal liver function tests    Allergic state    Anemia    Arthritis    spine, hands, shoulder with previous cuff tear   CAD  (coronary artery disease)    a.  11/2015 CT Chest/Abd: Coronary Ca2+ noted;  b. MV: EF 45-54%, med defect of mod severity in basal infsept, basal inf, mid infsept, and mid inf region-> infarct and peri-infarct ischemia-->intermediate risk; c. 05/2016 Cath/PCI: LM 40ost, LAD 30p, 75m, D1 60ost, LCX 60ost, OM1 99 (small->Med Rx), RCA 60p, 95d (2.5x15 Resolute Onyx DES), RPDA/RPLB min irregs, D2 60, EF 55-65%/   Candidiasis of esophagus (HCC)    Colon polyp    DDD (degenerative disc disease), lumbosacral    Dysphagia    Elevated transaminase level    Esophageal stricture    a. s/p multiple dilations - last 03/2016.   Esophagitis, [place] grade D    Ganglion cyst of finger of right hand    GERD (gastroesophageal reflux disease)    Guaiac positive stools    Hiatal hernia    HNP (herniated nucleus pulposus), lumbar    Hypercholesterolemia    Hypertension    Lumbar radiculitis    Mallory-Weiss tear    a. 11/2015.   Migraine aura without headache    migraine, visual   Psoriasis    with psoriatic arthritis   Psoriatic arthritis ([place])    severe   Schatzki's ring    Sleep apnea    does not uses CPAP. has lost weight so is much better.   Type II diabetes mellitus ([place])     Past Surgical History:  Procedure Laterality Date   ARTHROPLASTY     right thumb; left thumb with 2 screws and plastic joint   BALLOON DILATION N/A 05/29/2015   Procedure: BALLOON DILATION;  Surgeon: Lollie Sails, MD;  Location: [facility] ENDOSCOPY;  Service: Endoscopy;  Laterality: N/A;   BALLOON DILATION N/A 02/14/2017   Procedure: BALLOON DILATION;  Surgeon: Lollie Sails, MD;  Location: [facility] ENDOSCOPY;  Service: Endoscopy;  Laterality: N/A;   BILATERAL CARPAL TUNNEL RELEASE Bilateral 07/04/2017   Procedure: BILATERAL CARPAL TUNNEL RELEASE;  Surgeon: Leanor Kail, MD;  Location: [facility] ORS;  Service: Orthopedics;  Laterality: Bilateral;   BLEPHAROPLASTY     CARDIAC CATHETERIZATION      1 stent    CARPAL TUNNEL RELEASE  2005   right   CHOLECYSTECTOMY  2008   COLONOSCOPY     COLONOSCOPY WITH PROPOFOL N/A 02/13/2018   Procedure: COLONOSCOPY WITH PROPOFOL;  Surgeon: Lollie Sails, MD;  Location: [facility] ENDOSCOPY;  Service: Endoscopy;  Laterality: N/A;   CORONARY STENT INTERVENTION N/A 06/14/2016   Procedure: Coronary Stent Intervention;  Surgeon: Wellington Hampshire, MD;  Location: [place] CV LAB;  Service: Cardiovascular;  Laterality: N/A;   CYST EXCISION  04/14/2015   tendon sheath cyst excision; right ring finger cyst removed and trigger finger realease   ESOPHAGOGASTRODUODENOSCOPY     ESOPHAGOGASTRODUODENOSCOPY (EGD) WITH PROPOFOL N/A 05/29/2015   Procedure: ESOPHAGOGASTRODUODENOSCOPY (EGD) WITH PROPOFOL;  Surgeon: Lollie Sails, MD;  Location: [facility] ENDOSCOPY;  Service: Endoscopy;  Laterality: N/A;   ESOPHAGOGASTRODUODENOSCOPY (EGD) WITH PROPOFOL N/A 07/04/2015   Procedure: ESOPHAGOGASTRODUODENOSCOPY (EGD) WITH PROPOFOL;  Surgeon: Lollie Sails, MD;  Location: [facility] ENDOSCOPY;  Service: Endoscopy;  Laterality: N/A;   ESOPHAGOGASTRODUODENOSCOPY (EGD) WITH PROPOFOL N/A 01/04/2016   Procedure: ESOPHAGOGASTRODUODENOSCOPY (EGD) WITH PROPOFOL;  Surgeon: Lollie Sails, MD;  Location: [facility] ENDOSCOPY;  Service: Endoscopy;  Laterality: N/A;   ESOPHAGOGASTRODUODENOSCOPY (EGD) WITH PROPOFOL N/A 03/22/2016   Procedure: ESOPHAGOGASTRODUODENOSCOPY (EGD) WITH PROPOFOL;  Surgeon: Lollie Sails, MD;  Location: [facility] ENDOSCOPY;  Service: Endoscopy;  Laterality: N/A;   ESOPHAGOGASTRODUODENOSCOPY (EGD) WITH PROPOFOL N/A 02/14/2017   Procedure: ESOPHAGOGASTRODUODENOSCOPY (EGD) WITH PROPOFOL;  Surgeon: Lollie Sails, MD;  Location: [facility] ENDOSCOPY;  Service: Endoscopy;  Laterality: N/A;   ESOPHAGOGASTRODUODENOSCOPY (EGD) WITH PROPOFOL N/A 04/11/2017   Procedure: ESOPHAGOGASTRODUODENOSCOPY (EGD) WITH PROPOFOL;  Surgeon: Lollie Sails, MD;  Location: [facility]  ENDOSCOPY;  Service: Endoscopy;  Laterality: N/A;   ESOPHAGOGASTRODUODENOSCOPY (EGD) WITH PROPOFOL N/A 07/07/2017   Procedure: ESOPHAGOGASTRODUODENOSCOPY (EGD) WITH PROPOFOL;  Surgeon: Lucilla Lame, MD;  Location: [facility] ENDOSCOPY;  Service: Endoscopy;  Laterality: N/A;   ESOPHAGOGASTRODUODENOSCOPY (EGD) WITH PROPOFOL N/A 02/13/2018   Procedure: ESOPHAGOGASTRODUODENOSCOPY (EGD) WITH PROPOFOL;  Surgeon: Lollie Sails, MD;  Location: [facility] ENDOSCOPY;  Service: Endoscopy;  Laterality: N/A;   EYE SURGERY     FOREIGN BODY REMOVAL N/A 12/03/2015   Procedure: FOREIGN BODY REMOVAL;  Surgeon: Wilford Corner, MD;  Location: [facility] ENDOSCOPY;  Service: Endoscopy;  Laterality: N/A;   HERNIA REPAIR Right 1970   inguinal   INGUINAL HERNIA REPAIR Left 01/01/2018   Procedure: OPEN HERNIA REPAIR INGUINAL ADULT;  Surgeon: Olean Ree, MD;  Location: [facility] ORS;  Service: General;  Laterality: Left;   LEFT HEART CATH Left 06/14/2016   Procedure: Left Heart Cath;  Surgeon: Wellington Hampshire, MD;  Location: [place] CV LAB;  Service: Cardiovascular;  Laterality: Left;   LEFT HEART CATH AND CORONARY ANGIOGRAPHY Left 09/14/2018   Procedure: LEFT HEART CATH AND CORONARY ANGIOGRAPHY;  Surgeon: Wellington Hampshire, MD;  Location: [place] CV LAB;  Service: Cardiovascular;  Laterality: Left;   LUMBAR [place] SURGERY  2004   injections; herniated disc; percutaneous discectomy   NASAL SINUS SURGERY  2008   percutaneous lumbar discectomy     ROTATOR CUFF REPAIR Right    TONSILLECTOMY      Current Medications: No outpatient medications have been marked as taking for the 09/28/18 encounter (Appointment) with Rise Mu, PA-C.    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, product/process development strain: Not on file   Food insecurity    Worry: Not on file    Inability: Not on file   Transportation needs    Medical: Not on file    Non-medical: Not on file  Tobacco Use   Smoking status: Former Smoker    Types: Cigarettes    Quit date: 07/06/1982    Years since quitting: 36.2   Smokeless tobacco: Never Used  Substance and Sexual Activity   Alcohol use: Yes    Alcohol/week: 0.0 standard drinks   Drug use: No   Sexual activity: Not on file  Lifestyle   Physical activity  Days per week: Not on file    Minutes per session: Not on file   Stress: Not on file  Relationships   Social connections    Talks on phone: Not on file    Gets together: Not on file    Attends religious service: Not on file    Active member of club or organization: Not on file    Attends meetings of clubs or organizations: Not on file    Relationship status: Not on file  Other Topics Concern   Not on file  Social History Narrative   Lives locally with wife.  Active but does not routinely exercise.     Family History:  [name] family history includes Aneurysm in his son; Cancer in his father; Hypertension in his mother; Lung cancer in his father.  ROS:   ROS   EKGs/Labs/Other Studies Reviewed:    Studies reviewed were summarized above. The additional studies were reviewed today: As above  EKG:  EKG is ordered today.  The EKG ordered today demonstrates ***  Recent Labs: 02/27/2018: TSH 3.10 07/10/2018: ALT 18 09/22/2018: BUN 13; Creatinine, Ser 0.85; Hemoglobin 11.7; Platelets 196; Potassium 3.8; Sodium 144  Recent Lipid Panel    Component Value Date/Time   CHOL 87 07/10/2018 0801   TRIG 178.0 (H) 07/10/2018 0801   HDL 31.30 (L) 07/10/2018 0801   CHOLHDL 3 07/10/2018 0801    VLDL 35.6 07/10/2018 0801   LDLCALC 20 07/10/2018 0801    PHYSICAL EXAM:    VS:  There were no vitals taken for this visit.  BMI: There is no height or weight on file to calculate BMI.  Physical Exam  Wt Readings from Last 3 Encounters:  09/22/18 182 lb (82.6 kg)  09/14/18 182 lb 1.6 oz (82.6 kg)  09/10/18 182 lb (82.6 kg)     ASSESSMENT & PLAN:   1. ***  Disposition: F/u with Dr. Fletcher Anon or an APP in ***.   Medication Adjustments/Labs and Tests Ordered: Current medicines are reviewed at length with the patient today.  Concerns regarding medicines are outlined above. Medication changes, Labs and Tests ordered today are summarized above and listed in the Patient Instructions accessible in Encounters.   Signed, Christell Faith, PA-C 09/23/2018 1:15 PM     [place] [address] [place] [address] [place], [place] [address] [phone]

## 2018-09-23 NOTE — Discharge Instructions (Signed)
Your work-up tonight was reassuring.  Your lab work, chest x-ray, EKG, and urinalysis are all within normal limits.  Please await the results of your outpatient COVID-19 test, and your blood cultures are also pending; if they come back positive, meaning that you have bacteria in your blood, you will be contacted by the hospital to return to the ED for antibiotics.  Otherwise you should follow-up with your primary care doctor.  Return to the emergency department with new or worsening symptoms that concern you.

## 2018-09-24 ENCOUNTER — Other Ambulatory Visit: Payer: Self-pay | Admitting: Internal Medicine

## 2018-09-24 ENCOUNTER — Encounter: Payer: Self-pay | Admitting: Internal Medicine

## 2018-09-24 MED ORDER — PREDNISONE 10 MG PO TABS: ORAL_TABLET | ORAL | 0 refills | Status: DC

## 2018-09-24 NOTE — Progress Notes (Signed)
rx sent in for prednisone.   

## 2018-09-24 NOTE — Telephone Encounter (Signed)
I called and spoke with the patient to follow up on his BP/ symptoms since stopping losartan.   Per the patient, his BP (HR) yesterday were: - 8 am: 106/ 62 (79) - late AM: 116/65 (85) late AM - mid day: 111/65 (85) - late PM: 142/ 76 (84)  Per the patient, he did take losartan yesterday as he forgot to take this out of his pill box.  He checked his BP while on the phone with me and he was 141/80 (77) He has not had losartan today.  He does have intermittent chest "tightness" with exertion.   His PCP sent him to the ER on 9/8 for further work up and evaluation of his fevers. He was COVID negative. He states he has a dry cough. His fever is up/ down- he was 98.4 early this morning, then last temp was 99.4.  He has forwarded his temp readings to Dr. Nicki Reaper via [place].   I have advised the patient that he might require further stress testing, but this may have to wait until after his fevers resolve.  He is aware I will forward this message to Dr. Hillery Hunter, PA to review and we will call him back with further recommendations.  The patient voices understanding and is agreeable.

## 2018-09-25 NOTE — Telephone Encounter (Signed)
BP appears to be improving off losartan.  Will plan to follow-up with him regarding his symptoms at his office visit first part of next week.  If symptoms change or worsen recommend he proceed to the ED.

## 2018-09-25 NOTE — Telephone Encounter (Signed)
Patient verbalized understanding of Ryan's recommendations and plan.

## 2018-09-27 LAB — CULTURE, BLOOD (ROUTINE X 2)
Culture: NO GROWTH
Culture: NO GROWTH

## 2018-09-28 ENCOUNTER — Ambulatory Visit: Payer: Medicare Other | Admitting: Physician Assistant
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
## 2018-10-01 ENCOUNTER — Other Ambulatory Visit: Payer: Self-pay

## 2018-10-01 ENCOUNTER — Encounter: Payer: Self-pay | Admitting: Physician Assistant

## 2018-10-01 ENCOUNTER — Ambulatory Visit (INDEPENDENT_AMBULATORY_CARE_PROVIDER_SITE_OTHER): Payer: Medicare Other | Admitting: Physician Assistant

## 2018-10-01 VITALS — BP 110/80 | HR 52 | Temp 98.1°F | Ht 68.5 in | Wt 180.5 lb

## 2018-10-01 DIAGNOSIS — I2510 Atherosclerotic heart disease of native coronary artery without angina pectoris: Secondary | ICD-10-CM

## 2018-10-01 DIAGNOSIS — K219 Gastro-esophageal reflux disease without esophagitis: Secondary | ICD-10-CM | POA: Diagnosis not present

## 2018-10-01 DIAGNOSIS — E785 Hyperlipidemia, unspecified: Secondary | ICD-10-CM | POA: Diagnosis not present

## 2018-10-01 DIAGNOSIS — E119 Type 2 diabetes mellitus without complications: Secondary | ICD-10-CM

## 2018-10-01 DIAGNOSIS — I10 Essential (primary) hypertension: Secondary | ICD-10-CM | POA: Diagnosis not present

## 2018-10-01 DIAGNOSIS — I5022 Chronic systolic (congestive) heart failure: Secondary | ICD-10-CM

## 2018-10-01 MED ORDER — LOSARTAN POTASSIUM 25 MG PO TABS: 12.5000 mg | ORAL_TABLET | Freq: Every day | ORAL | 3 refills | Status: DC

## 2018-10-01 NOTE — Patient Instructions (Signed)
Medication Instructions:  Your physician has recommended you make the following change in your medication:  1. START Losartan 25 mg take 1/2 pill (12.5 mg) Once daily  If you need a refill on your cardiac medications before your next appointment, please call your pharmacy.   Lab work: None If you have labs (blood work) drawn today and your tests are completely normal, you will receive your results only by: Marland Kitchen MyChart Message (if you have MyChart) OR . A paper copy in the mail If you have any lab test that is abnormal or we need to change your treatment, we will call you to review the results.  Testing/Procedures: None  Follow-Up: At [facility], you and your health needs are our priority.  As part of our continuing mission to provide you with exceptional heart care, we have created designated Provider Care Teams.  These Care Teams include your primary Cardiologist (physician) and Advanced Practice Providers (APPs -  Physician Assistants and Nurse Practitioners) who all work together to provide you with the care you need, when you need it. You will need a follow up appointment in 3 months.  Please call our office 2 months in advance to schedule this appointment.  You may see Kathlyn Sacramento, MD or one of the following Advanced Practice Providers on your designated Care Team:   Murray Hodgkins, NP Christell Faith, PA-C . Marrianne Mood, PA-C  Any Other Special Instructions Will Be Listed Below (If Applicable).

## 2018-10-08 DIAGNOSIS — L4050 Arthropathic psoriasis, unspecified: Secondary | ICD-10-CM | POA: Diagnosis not present

## 2018-10-08 DIAGNOSIS — Z79899 Other long term (current) drug therapy: Secondary | ICD-10-CM | POA: Diagnosis not present

## 2018-10-12 ENCOUNTER — Other Ambulatory Visit: Payer: Self-pay | Admitting: Internal Medicine

## 2018-11-13 ENCOUNTER — Encounter: Payer: Self-pay | Admitting: Internal Medicine

## 2018-11-13 NOTE — Telephone Encounter (Signed)
Pt scheduled for visit Monday. Advised if symptoms worsen he will need to be evaluated over the weekend at acute care. Pt understood

## 2018-11-16 ENCOUNTER — Other Ambulatory Visit: Payer: Self-pay

## 2018-11-16 ENCOUNTER — Ambulatory Visit (INDEPENDENT_AMBULATORY_CARE_PROVIDER_SITE_OTHER): Payer: Medicare Other | Admitting: Internal Medicine

## 2018-11-16 DIAGNOSIS — D649 Anemia, unspecified: Secondary | ICD-10-CM | POA: Diagnosis not present

## 2018-11-16 DIAGNOSIS — R945 Abnormal results of liver function studies: Secondary | ICD-10-CM

## 2018-11-16 DIAGNOSIS — R7989 Other specified abnormal findings of blood chemistry: Secondary | ICD-10-CM

## 2018-11-16 DIAGNOSIS — E0822 Diabetes mellitus due to underlying condition with diabetic chronic kidney disease: Secondary | ICD-10-CM | POA: Diagnosis not present

## 2018-11-16 DIAGNOSIS — L4050 Arthropathic psoriasis, unspecified: Secondary | ICD-10-CM

## 2018-11-16 DIAGNOSIS — N183 Chronic kidney disease, stage 3 unspecified: Secondary | ICD-10-CM

## 2018-11-16 DIAGNOSIS — K219 Gastro-esophageal reflux disease without esophagitis: Secondary | ICD-10-CM

## 2018-11-16 DIAGNOSIS — I10 Essential (primary) hypertension: Secondary | ICD-10-CM

## 2018-11-16 DIAGNOSIS — L989 Disorder of the skin and subcutaneous tissue, unspecified: Secondary | ICD-10-CM | POA: Diagnosis not present

## 2018-11-16 NOTE — Progress Notes (Signed)
Patient ID: Shane Mcknight, male   DOB: 10/15/1950, 68 y.o.   MRN: [id number]   Subjective:    Patient ID: Shane Mcknight, male    DOB: September 15, 1950, 68 y.o.   MRN: [id number]  HPI  Patient here as a work in with concerns regarding skin changes - fingers.  Describes localized on multiple fingers.  Some pain.  No other rash.  No fever.  Staying active.  No chest pain.  No sob.  No acid reflux.  No abdominal pain.  Bowels moving.  Breathing doing well.  No cough or congestion.  S/p RCA stent - 2018.  Saw cardiology 09/2018.  They recommended restarting losartan.12.25m q day.  States blood pressures doing well.     Past Medical History:  Diagnosis Date  . Abnormal liver function tests   . Allergic state   . Anemia   . Arthritis    spine, hands, shoulder with previous cuff tear  . CAD (coronary artery disease)    a.  11/2015 CT Chest/Abd: Coronary Ca2+ noted;  b. MV: EF 45-54%, med defect of mod severity in basal infsept, basal inf, mid infsept, and mid inf region-> infarct and peri-infarct ischemia-->intermediate risk; c. 05/2016 Cath/PCI: LM 40ost, LAD 30p, 568mD1 60ost, LCX 60ost, OM1 99 (small->Med Rx), RCA 60p, 95d (2.5x15 Resolute Onyx DES), RPDA/RPLB min irregs, D2 60, EF 55-65%/  . [place]asis of [place] ([place]  . Colon polyp   . DDD (degenerative disc disease), lumbosacral   . Dysphagia   . Elevated transaminase level   . Esophageal stricture    a. s/p multiple dilations - last 03/2016.  . Marland Kitchensophagitis, [place] grade D   . Ganglion cyst of finger of right hand   . GERD (gastroesophageal reflux disease)   . Guaiac positive stools   . Hiatal hernia   . HNP (herniated nucleus pulposus), lumbar   . Hypercholesterolemia   . Hypertension   . Lumbar radiculitis   . Mallory-Weiss tear    a. 11/2015.  . Migraine aura without headache    migraine, visual  . Psoriasis    with psoriatic arthritis  . Psoriatic arthritis (HCC)    severe  . Schatzki's ring   . Sleep apnea    does not  uses CPAP. has lost weight so is much better.  . Type II diabetes mellitus ([place]   Past Surgical History:  Procedure Laterality Date  . ARTHROPLASTY     right thumb; left thumb with 2 screws and plastic joint  . BALLOON DILATION N/A 05/29/2015   Procedure: BALLOON DILATION;  Surgeon: MaLollie SailsMD;  [facility];  Service: Endoscopy;  Laterality: N/A;  . BALLOON DILATION N/A 02/14/2017   Procedure: BALLOON DILATION;  Surgeon: SkLollie SailsMD;  [facility];  Service: Endoscopy;  Laterality: N/A;  . BILATERAL CARPAL TUNNEL RELEASE Bilateral 07/04/2017   Procedure: BILATERAL CARPAL TUNNEL RELEASE;  Surgeon: KeLeanor KailMD;  [facility]: [facility];  Service: Orthopedics;  Laterality: Bilateral;  . BLEPHAROPLASTY    . CARDIAC CATHETERIZATION     1 stent   . CARPAL TUNNEL RELEASE  2005   right  . CHOLECYSTECTOMY  2008  . COLONOSCOPY    . COLONOSCOPY WITH PROPOFOL N/A 02/13/2018   Procedure: COLONOSCOPY WITH PROPOFOL;  Surgeon: SkLollie SailsMD;  [facility];  Service: Endoscopy;  Laterality: N/A;  . CORONARY STENT INTERVENTION N/A 06/14/2016   Procedure: Coronary Stent Intervention;  Surgeon: ArWellington Hampshire  MD;  Location: [place] CV LAB;  Service: Cardiovascular;  Laterality: N/A;  . CYST EXCISION  04/14/2015   tendon sheath cyst excision; right ring finger cyst removed and trigger finger realease  . ESOPHAGOGASTRODUODENOSCOPY    . ESOPHAGOGASTRODUODENOSCOPY (EGD) WITH PROPOFOL N/A 05/29/2015   Procedure: ESOPHAGOGASTRODUODENOSCOPY (EGD) WITH PROPOFOL;  Surgeon: Lollie Sails, MD;  Location: [facility] END[facility]COPY;  Service: Endoscopy;  Laterality: N/A;  . ESOPHAGOGASTRODUODENOSCOPY (EGD) WITH PROPOFOL N/A 07/04/2015   Procedure: ESOPHAGOGASTRODUODENOSCOPY (EGD) WITH PROPOFOL;  Surgeon: Lollie Sails, MD;  Location: [facility] ENDOSCOPY;  Service: Endoscopy;  Laterality: N/A;  . ESOPHAGOGASTRODUODENOSCOPY (EGD) WITH PROPOFOL N/A  01/04/2016   Procedure: ESOPHAGOGASTRODUODENOSCOPY (EGD) WITH PROPOFOL;  Surgeon: Lollie Sails, MD;  Location[facility];  Service: Endoscopy;  Laterality: N/A;  . ESOPHAGOGASTRODUODENOSCOPY (EGD) WITH PROPOFOL N/A 03/22/2016   Procedure: ESOPHAGOGASTRODUODENOSCOPY (EGD) WITH PROPOFOL;  Surgeon: Lollie Sails, MD;  Location[facility];  Service: Endoscopy;  Laterality: N/A;  . ESOPHAGOGASTRODUODENOSCOPY (EGD) WITH PROPOFOL N/A 02/14/2017   Procedure: ESOPHAGOGASTRODUODENOSCOPY (EGD) WITH PROPOFOL;  Surgeon: Lollie Sails, MD;  Location: [facility] ENDOSCOPY;  Service: Endoscopy;  Laterality: N/A;  . ESOPHAGOGASTRODUODENOSCOPY (EGD) WITH PROPOFOL N/A 04/11/2017   Procedure: ESOPHAGOGASTRODUODENOSCOPY (EGD) WITH PROPOFOL;  Surgeon: Lollie Sails, MD;  Location: [facility] ENDOSCOPY;  Service: Endoscopy;  Laterality: N/A;  . ESOPHAGOGASTRODUODENOSCOPY (EGD) WITH PROPOFOL N/A 07/07/2017   Procedure: ESOPHAGOGASTRODUODENOSCOPY (EGD) WITH PROPOFOL;  Surgeon: Lucilla Lame, MD;  Location: [facility] ENDOSCOPY;  Service: Endoscopy;  Laterality: N/A;  . ESOPHAGOGASTRODUODENOSCOPY (EGD) WITH PROPOFOL N/A 02/13/2018   Procedure: ESOPHAGOGASTRODUODENOSCOPY (EGD) WITH PROPOFOL;  Surgeon: Lollie Sails, MD;  Location: [facility] ENDOSCOPY;  Service: Endoscopy;  Laterality: N/A;  . EYE SURGERY    . FOREIGN BODY REMOVAL N/A 12/03/2015   Procedure: FOREIGN BODY REMOVAL;  Surgeon: Wilford Corner, MD;  Location: [facility] ENDOSCOPY;  Service: Endoscopy;  Laterality: N/A;  . HERNIA REPAIR Right 1970   inguinal  . INGUINAL HERNIA REPAIR Left 01/01/2018   Procedure: OPEN HERNIA REPAIR INGUINAL ADULT;  Surgeon: Olean Ree, MD;  Location: [facility] ORS;  Service: General;  Laterality: Left;  . LEFT HEART CATH Left 06/14/2016   Procedure: Left Heart Cath;  Surgeon: Wellington Hampshire, MD;  Location: [place] CV LAB;  Service: Cardiovascular;  Laterality: Left;  . LEFT HEART CATH AND CORONARY ANGIOGRAPHY Left 09/14/2018    Procedure: LEFT HEART CATH AND CORONARY ANGIOGRAPHY;  Surgeon: Wellington Hampshire, MD;  Location: [place] CV LAB;  Service: Cardiovascular;  Laterality: Left;  . LUMBAR DISC SURGERY  2004   injections; herniated disc; percutaneous discectomy  . NASAL SINUS SURGERY  2008  . percutaneous lumbar discectomy    . ROTATOR CUFF REPAIR Right   . TONSILLECTOMY     Family History  Problem Relation Age of Onset  . Lung cancer Father   . Cancer Father   . Hypertension Mother   . Aneurysm Son    Social History   Socioeconomic History  . Marital status: Married    Spouse name: Pamala Hurry  . Number of children: Not on file  . Years of education: Not on file  . Highest education level: Not on file  Occupational History    Comment: not heavy work  Social Needs  . Financial resource strain: Not on file  . Food insecurity    Worry: Not on file    Inability: Not on file  . Transportation needs    Medical: Not on file    Non-medical: Not on file  Tobacco Use  . Smoking status: Former Smoker    Types: Cigarettes    Quit date: 07/06/1982    Years since quitting: 36.4  . Smokeless tobacco: Never Used  Substance and Sexual Activity  . Alcohol use: Yes    Alcohol/week: 0.0 standard drinks  . Drug use: No  . Sexual activity: Not on file  Lifestyle  . Physical activity    Days per week: Not on file    Minutes per session: Not on file  . Stress: Not on file  Relationships  . Social Herbalist on phone: Not on file    Gets together: Not on file    Attends religious service: Not on file    Active member of club or organization: Not on file    Attends meetings of clubs or organizations: Not on file    Relationship status: Not on file  Other Topics Concern  . Not on file  Social History Narrative   Lives locally with wife.  Active but does not routinely exercise.    Outpatient Encounter Medications as of 11/16/2018  Medication Sig  . acetaminophen (TYLENOL) 500 MG tablet  Take 1,000 mg by mouth daily as needed for moderate pain.  Marland Kitchen aspirin EC 81 MG tablet Take 81 mg by mouth every evening.   Marland Kitchen atenolol (TENORMIN) 50 MG tablet TAKE 1 TABLET BY MOUTH  TWICE A DAY  . COSENTYX SENSOREADY PEN 150 MG/ML SOAJ Inject 300 mg into the skin every 30 (thirty) days.   . cyanocobalamin (,VITAMIN B-12,) 1000 MCG/ML injection Inject 1 mL (1,000 mcg total) into the muscle every 30 (thirty) days.  . folic acid (FOLVITE) 1 MG tablet Take 1 mg by mouth every evening.   . gabapentin (NEURONTIN) 300 MG capsule Take 1-2 capsules q hs (Patient taking differently: Take 300-600 mg by mouth at bedtime as needed (pain). Take 1-2 capsules prn)  . isosorbide mononitrate (IMDUR) 30 MG 24 hr tablet Take 1 tablet (30 mg total) by mouth daily.  . metFORMIN (GLUCOPHAGE) 500 MG tablet TAKE 2 TABLETS BY MOUTH 2  TIMES DAILY WITH MEALS. (Patient taking differently: Take 1,000 mg by mouth 2 (two) times daily with a meal. )  . methotrexate (RHEUMATREX) 2.5 MG tablet Take 20 mg by mouth once a week. Take 8 tablets every Friday.  . Multiple Vitamin (MULTIVITAMIN) tablet Take 1 tablet by mouth daily.  . nitroGLYCERIN (NITROSTAT) 0.4 MG SL tablet Place 1 tablet (0.4 mg total) under the tongue every 5 (five) minutes as needed. MAXIMUM OF 3 DOSES.  Marland Kitchen pantoprazole (PROTONIX) 40 MG tablet TAKE 1 TABLET BY MOUTH  TWICE DAILY  . predniSONE (DELTASONE) 10 MG tablet Take 4 tablets x 1 day and then decrease by 1/2 tablet per day until down to zero mg.  . rosuvastatin (CRESTOR) 10 MG tablet Take 1 tablet (10 mg total) by mouth daily.  . SYRINGE-NEEDLE, DISP, 3 ML 25G X 5/8" 3 ML MISC Use as instructed with B12 injection.  . triamcinolone cream (KENALOG) 0.5 % Apply 1 application topically 2 (two) times daily as needed (scaling).  . vitamin E 400 UNIT capsule Take 400 Units by mouth every evening.  . [DISCONTINUED] losartan (COZAAR) 25 MG tablet Take 0.5 tablets (12.5 mg total) by mouth daily.   No  facility-administered encounter medications on file as of 11/16/2018.    Review of Systems  Constitutional: Negative for appetite change and unexpected weight change.  HENT: Negative for congestion and sinus pressure.  Respiratory: Negative for cough, chest tightness and shortness of breath.   Cardiovascular: Negative for chest pain, palpitations and leg swelling.  Gastrointestinal: Negative for abdominal pain, diarrhea, nausea and vomiting.  Genitourinary: Negative for difficulty urinating and dysuria.  Musculoskeletal: Negative for joint swelling and myalgias.  Skin: Negative for color change and rash.       Two small pin point lesions - finger - appear to be c/w wart.  Has increased sensitivity and some minimal erythema - fingers.    Neurological: Negative for dizziness, light-headedness and headaches.  Psychiatric/Behavioral: Negative for agitation and dysphoric mood.       Objective:    Physical Exam Constitutional:      General: He is not in acute distress.    Appearance: Normal appearance. He is well-developed.  HENT:     Head: Normocephalic and atraumatic.     Right Ear: External ear normal.     Left Ear: External ear normal.  Eyes:     General: No scleral icterus.       Right eye: No discharge.        Left eye: No discharge.     Conjunctiva/sclera: Conjunctivae normal.  Cardiovascular:     Rate and Rhythm: Normal rate and regular rhythm.  Pulmonary:     Effort: Pulmonary effort is normal. No respiratory distress.     Breath sounds: Normal breath sounds.  Abdominal:     General: Bowel sounds are normal.     Palpations: Abdomen is soft.     Tenderness: There is no abdominal tenderness.  Musculoskeletal:        General: No swelling or tenderness.  Skin:    Findings: No erythema or rash.  Neurological:     Mental Status: He is alert.  Psychiatric:        Mood and Affect: Mood normal.        Behavior: Behavior normal.     BP 138/82   Pulse 71   Temp 98.5 F  (36.9 C) (Temporal)   Resp 16   Wt 184 lb 6 oz (83.6 kg)   SpO2 96%   BMI 27.63 kg/m  Wt Readings from Last 3 Encounters:  11/16/18 184 lb 6 oz (83.6 kg)  10/01/18 180 lb 8 oz (81.9 kg)  09/22/18 182 lb (82.6 kg)     Lab Results  Component Value Date   WBC 5.5 09/22/2018   HGB 11.7 (L) 09/22/2018   HCT 34.5 (L) 09/22/2018   PLT 196 09/22/2018   GLUCOSE 104 (H) 09/22/2018   CHOL 87 07/10/2018   TRIG 178.0 (H) 07/10/2018   HDL 31.30 (L) 07/10/2018   LDLCALC 20 07/10/2018   ALT 18 07/10/2018   AST 15 07/10/2018   NA 144 09/22/2018   K 3.8 09/22/2018   CL 112 (H) 09/22/2018   CREATININE 0.85 09/22/2018   BUN 13 09/22/2018   CO2 23 09/22/2018   TSH 3.10 02/27/2018   PSA 1.5 03/13/2018   INR 1.1 06/07/2016   HGBA1C 6.2 07/10/2018   MICROALBUR 13.6 (H) 02/27/2018    Dg Chest 2 View  Result Date: 09/22/2018 CLINICAL DATA:  Approximate one-week history of fever and chest tightness. EXAM: CHEST - 2 VIEW COMPARISON:  06/27/2017 and earlier. FINDINGS: Cardiomediastinal silhouette unremarkable and unchanged. Suboptimal inspiration which accounts for mild atelectasis in the LEFT LOWER LOBE. Lungs otherwise clear. Bronchovascular markings normal. No localized airspace consolidation. No pleural effusions. No pneumothorax. Normal pulmonary vascularity. Degenerative changes involving the thoracic and visualized lumbar  spine. IMPRESSION: Suboptimal inspiration accounts for mild atelectasis in the LEFT LOWER LOBE. No acute cardiopulmonary disease otherwise. Electronically Signed   By: Evangeline Dakin M.D.   On: 09/22/2018 20:12       Assessment & Plan:   Problem List Items Addressed This Visit    Abnormal liver function tests    Follow liver function tests.       Anemia    Follow cbc.       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  Follow met b and a1c.         GERD (gastroesophageal reflux disease)    Controlled.  Follow.       Hypertension    Blood pressure on recheck improved.  Continue current medication regimen.  Follow metabolic panel.        Psoriatic arthritis ([place])    Followed by Dr Jefm Bryant.  On immunosuppressive agent.  Stable.      Skin lesion of hand    Couple of lesions appear to be c/w warts.  Some increased sensitivity.  Will have dermatology evaluate.        Relevant Orders   Ambulatory referral to Dermatology       Einar Pheasant, MD

## 2018-11-18 ENCOUNTER — Other Ambulatory Visit: Payer: Self-pay | Admitting: Internal Medicine

## 2018-11-18 DIAGNOSIS — I5022 Chronic systolic (congestive) heart failure: Secondary | ICD-10-CM

## 2018-11-18 MED ORDER — LOSARTAN POTASSIUM 25 MG PO TABS: 12.5000 mg | ORAL_TABLET | Freq: Every day | ORAL | 3 refills | Status: DC

## 2018-11-21 ENCOUNTER — Encounter: Payer: Self-pay | Admitting: Internal Medicine

## 2018-11-21 NOTE — Assessment & Plan Note (Signed)
Low carb diet and exercise.  Follow met b and a1c.   

## 2018-11-21 NOTE — Assessment & Plan Note (Signed)
Blood pressure on recheck improved.  Continue current medication regimen.  Follow metabolic panel.

## 2018-11-21 NOTE — Assessment & Plan Note (Signed)
Couple of lesions appear to be c/w warts.  Some increased sensitivity.  Will have dermatology evaluate.

## 2018-11-21 NOTE — Assessment & Plan Note (Signed)
Follow cbc.  

## 2018-11-21 NOTE — Assessment & Plan Note (Signed)
Followed by Dr Jefm Bryant.  On immunosuppressive agent.  Stable.

## 2018-11-21 NOTE — Assessment & Plan Note (Signed)
Follow liver function tests.   

## 2018-11-21 NOTE — Assessment & Plan Note (Signed)
Controlled.  Follow.   

## 2018-11-22 ENCOUNTER — Encounter: Payer: Self-pay | Admitting: Internal Medicine

## 2018-12-01 ENCOUNTER — Other Ambulatory Visit: Payer: Self-pay | Admitting: Internal Medicine

## 2018-12-02 ENCOUNTER — Encounter: Payer: Self-pay | Admitting: Internal Medicine

## 2018-12-02 DIAGNOSIS — Z0000 Encounter for general adult medical examination without abnormal findings: Secondary | ICD-10-CM

## 2018-12-15 ENCOUNTER — Other Ambulatory Visit: Payer: Self-pay | Admitting: *Deleted

## 2018-12-15 MED ORDER — ISOSORBIDE MONONITRATE ER 30 MG PO TB24: 30.0000 mg | ORAL_TABLET | Freq: Every day | ORAL | 0 refills | Status: DC

## 2018-12-16 ENCOUNTER — Encounter: Payer: Self-pay | Admitting: Internal Medicine

## 2018-12-16 ENCOUNTER — Other Ambulatory Visit: Payer: Self-pay | Admitting: Internal Medicine

## 2018-12-18 ENCOUNTER — Ambulatory Visit (INDEPENDENT_AMBULATORY_CARE_PROVIDER_SITE_OTHER): Payer: Medicare Other | Admitting: Cardiovascular Disease

## 2018-12-18 ENCOUNTER — Other Ambulatory Visit: Payer: Self-pay

## 2018-12-18 ENCOUNTER — Encounter: Payer: Self-pay | Admitting: Cardiovascular Disease

## 2018-12-18 VITALS — BP 142/72 | HR 62 | Ht 68.5 in | Wt 187.5 lb

## 2018-12-18 DIAGNOSIS — I10 Essential (primary) hypertension: Secondary | ICD-10-CM | POA: Diagnosis not present

## 2018-12-18 DIAGNOSIS — I25118 Atherosclerotic heart disease of native coronary artery with other forms of angina pectoris: Secondary | ICD-10-CM | POA: Diagnosis not present

## 2018-12-18 DIAGNOSIS — E785 Hyperlipidemia, unspecified: Secondary | ICD-10-CM | POA: Diagnosis not present

## 2018-12-18 NOTE — Progress Notes (Signed)
Cardiology Office Note   Date:  12/18/2018   ID:  Shane Mcknight, DOB 09-19-1950, MRN [id number]  PCP:  Einar Pheasant, MD  Cardiologist:   Kathlyn Sacramento, MD   Chief Complaint  Patient presents with  . other    3 month follow up. patient denies chest pain and SOB at this time. Meds reviewed verbally with patient.       History of Present Illness: Shane Mcknight is a 68 y.o. male who presents for a follow-up visit regarding coronary artery disease. He has known history of hypertension, hyperlipidemia, type 2 diabetes, GERD and Mallory-Weiss tear.  The patient had angiogram in 2018.  Nuclear stress test showed inferior Brougher ischemia.  Cardiac catheterization at that time showed significant two-vessel coronary artery disease with severe stenosis in the distal right coronary artery. There was subtotal occlusion of OM1 which was small vessel less than 2 mm, 40% ostial left main stenosis and moderate LAD disease. Ejection fraction was normal. I performed successful angioplasty and drug-eluting stent placement to the right coronary artery.  He had recurrent anginal symptoms in August of this year.  I proceeded with repeat cardiac catheterization which showed patent distal RCA stent with no significant restenosis.  There was stable moderate mid LAD stenosis and proximal RCA stenosis.  OM1 was noted to be completely occluded.  In addition, there was moderate distal RCA disease just proximal to the previously placed stent.  Ejection fraction was normal with mildly elevated left ventricular end-diastolic pressure.  Imdur was added with subsequent improvement in anginal symptoms.  He had issues with hypotension that required decreasing the dose of losartan.  Currently he is feeling very well with no chest pain or shortness of breath.   Past Medical History:  Diagnosis Date  . Abnormal liver function tests   . Allergic state   . Anemia   . Arthritis    spine, hands, shoulder with previous cuff tear   . CAD (coronary artery disease)    a.  11/2015 CT Chest/Abd: Coronary Ca2+ noted;  b. MV: EF 45-54%, med defect of mod severity in basal infsept, basal inf, mid infsept, and mid inf region-> infarct and peri-infarct ischemia-->intermediate risk; c. 05/2016 Cath/PCI: LM 40ost, LAD 30p, 20m, D1 60ost, LCX 60ost, OM1 99 (small->Med Rx), RCA 60p, 95d (2.5x15 Resolute Onyx DES), RPDA/RPLB min irregs, D2 60, EF 55-65%/  . Candidiasis of esophagus ([place])   . Colon polyp   . DDD (degenerative disc disease), lumbosacral   . Dysphagia   . Elevated transaminase level   . Esophageal stricture    a. s/p multiple dilations - last 03/2016.  Marland Kitchen Esophagitis, [place] grade D   . Ganglion cyst of finger of right hand   . GERD (gastroesophageal reflux disease)   . Guaiac positive stools   . Hiatal hernia   . HNP (herniated nucleus pulposus), lumbar   . Hypercholesterolemia   . Hypertension   . Lumbar radiculitis   . Mallory-Weiss tear    a. 11/2015.  . Migraine aura without headache    migraine, visual  . Psoriasis    with psoriatic arthritis  . Psoriatic arthritis (HCC)    severe  . Schatzki's ring   . Sleep apnea    does not uses CPAP. has lost weight so is much better.  . Type II diabetes mellitus ([place])     Past Surgical History:  Procedure Laterality Date  . ARTHROPLASTY     right thumb; left thumb  with 2 screws and plastic joint  . BALLOON DILATION N/A 05/29/2015   Procedure: BALLOON DILATION;  Surgeon: Lollie Sails, MD;  Location: [facility] ENDOSCOPY;  Service: Endoscopy;  Laterality: N/A;  . BALLOON DILATION N/A 02/14/2017   Procedure: BALLOON DILATION;  Surgeon: Lollie Sails, MD;  Location: [facility] ENDOSCOPY;  Service: Endoscopy;  Laterality: N/A;  . BILATERAL CARPAL TUNNEL RELEASE Bilateral 07/04/2017   Procedure: BILATERAL CARPAL TUNNEL RELEASE;  Surgeon: Leanor Kail, MD;  Location: [facility] ORS;  Service: Orthopedics;  Laterality: Bilateral;  . BLEPHAROPLASTY    . CARDIAC  CATHETERIZATION     1 stent   . CARPAL TUNNEL RELEASE  2005   right  . CHOLECYSTECTOMY  2008  . COLONOSCOPY    . COLONOSCOPY WITH PROPOFOL N/A 02/13/2018   Procedure: COLONOSCOPY WITH PROPOFOL;  Surgeon: Lollie Sails, MD;  Location: [facility] ENDOSCOPY;  Service: Endoscopy;  Laterality: N/A;  . CORONARY STENT INTERVENTION N/A 06/14/2016   Procedure: Coronary Stent Intervention;  Surgeon: Wellington Hampshire, MD;  Location: [place] CV LAB;  Service: Cardiovascular;  Laterality: N/A;  . CYST EXCISION  04/14/2015   tendon sheath cyst excision; right ring finger cyst removed and trigger finger realease  . ESOPHAGOGASTRODUODENOSCOPY    . ESOPHAGOGASTRODUODENOSCOPY (EGD) WITH PROPOFOL N/A 05/29/2015   Procedure: ESOPHAGOGASTRODUODENOSCOPY (EGD) WITH PROPOFOL;  Surgeon: Lollie Sails, MD;  Location: [facility] ENDOSCOPY;  Service: Endoscopy;  Laterality: N/A;  . ESOPHAGOGASTRODUODENOSCOPY (EGD) WITH PROPOFOL N/A 07/04/2015   Procedure: ESOPHAGOGASTRODUODENOSCOPY (EGD) WITH PROPOFOL;  Surgeon: Lollie Sails, MD;  Location: [facility] ENDOSCOPY;  Service: Endoscopy;  Laterality: N/A;  . ESOPHAGOGASTRODUODENOSCOPY (EGD) WITH PROPOFOL N/A 01/04/2016   Procedure: ESOPHAGOGASTRODUODENOSCOPY (EGD) WITH PROPOFOL;  Surgeon: Lollie Sails, MD;  Location: [facility] ENDOSCOPY;  Service: Endoscopy;  Laterality: N/A;  . ESOPHAGOGASTRODUODENOSCOPY (EGD) WITH PROPOFOL N/A 03/22/2016   Procedure: ESOPHAGOGASTRODUODENOSCOPY (EGD) WITH PROPOFOL;  Surgeon: Lollie Sails, MD;  Location: [facility] ENDOSCOPY;  Service: Endoscopy;  Laterality: N/A;  . ESOPHAGOGASTRODUODENOSCOPY (EGD) WITH PROPOFOL N/A 02/14/2017   Procedure: ESOPHAGOGASTRODUODENOSCOPY (EGD) WITH PROPOFOL;  Surgeon: Lollie Sails, MD;  Location: [facility] ENDOSCOPY;  Service: Endoscopy;  Laterality: N/A;  . ESOPHAGOGASTRODUODENOSCOPY (EGD) WITH PROPOFOL N/A 04/11/2017   Procedure: ESOPHAGOGASTRODUODENOSCOPY (EGD) WITH PROPOFOL;  Surgeon: Lollie Sails, MD;   Location: [facility] ENDOSCOPY;  Service: Endoscopy;  Laterality: N/A;  . ESOPHAGOGASTRODUODENOSCOPY (EGD) WITH PROPOFOL N/A 07/07/2017   Procedure: ESOPHAGOGASTRODUODENOSCOPY (EGD) WITH PROPOFOL;  Surgeon: Lucilla Lame, MD;  Location: [facility] ENDOSCOPY;  Service: Endoscopy;  Laterality: N/A;  . ESOPHAGOGASTRODUODENOSCOPY (EGD) WITH PROPOFOL N/A 02/13/2018   Procedure: ESOPHAGOGASTRODUODENOSCOPY (EGD) WITH PROPOFOL;  Surgeon: Lollie Sails, MD;  Location: [facility] ENDOSCOPY;  Service: Endoscopy;  Laterality: N/A;  . EYE SURGERY    . FOREIGN BODY REMOVAL N/A 12/03/2015   Procedure: FOREIGN BODY REMOVAL;  Surgeon: Wilford Corner, MD;  Location: [facility] ENDOSCOPY;  Service: Endoscopy;  Laterality: N/A;  . HERNIA REPAIR Right 1970   inguinal  . INGUINAL HERNIA REPAIR Left 01/01/2018   Procedure: OPEN HERNIA REPAIR INGUINAL ADULT;  Surgeon: Olean Ree, MD;  Location: [facility] ORS;  Service: General;  Laterality: Left;  . LEFT HEART CATH Left 06/14/2016   Procedure: Left Heart Cath;  Surgeon: Wellington Hampshire, MD;  Location: [place] CV LAB;  Service: Cardiovascular;  Laterality: Left;  . LEFT HEART CATH AND CORONARY ANGIOGRAPHY Left 09/14/2018   Procedure: LEFT HEART CATH AND CORONARY ANGIOGRAPHY;  Surgeon: Wellington Hampshire, MD;  Location: [place] CV LAB;  Service: Cardiovascular;  Laterality: Left;  . LUMBAR DISC SURGERY  2004   injections; herniated disc; percutaneous discectomy  . NASAL SINUS SURGERY  2008  . percutaneous lumbar discectomy    . ROTATOR CUFF REPAIR Right   . TONSILLECTOMY       Current Outpatient Medications  Medication Sig Dispense Refill  . acetaminophen (TYLENOL) 500 MG tablet Take 1,000 mg by mouth daily as needed for moderate pain.    Marland Kitchen aspirin EC 81 MG tablet Take 81 mg by mouth every evening.     Marland Kitchen atenolol (TENORMIN) 50 MG tablet TAKE 1 TABLET BY MOUTH  TWICE DAILY 180 tablet 3  . COSENTYX SENSOREADY PEN 150 MG/ML SOAJ Inject 300 mg into the skin every 30 (thirty)  days.   10  . cyanocobalamin (,VITAMIN B-12,) 1000 MCG/ML injection Inject 1 mL (1,000 mcg total) into the muscle every 30 (thirty) days. 10 mL 1  . folic acid (FOLVITE) 1 MG tablet Take 1 mg by mouth every evening.     . gabapentin (NEURONTIN) 300 MG capsule TAKE 1 TO 2 CAPSULES BY  MOUTH AT BEDTIME 180 capsule 3  . isosorbide mononitrate (IMDUR) 30 MG 24 hr tablet Take 1 tablet (30 mg total) by mouth daily. 90 tablet 0  . losartan (COZAAR) 25 MG tablet Take 0.5 tablets (12.5 mg total) by mouth daily. 45 tablet 3  . metFORMIN (GLUCOPHAGE) 500 MG tablet TAKE 2 TABLETS BY MOUTH 2  TIMES DAILY WITH MEALS. (Patient taking differently: Take 1,000 mg by mouth 2 (two) times daily with a meal. ) 360 tablet 1  . methotrexate (RHEUMATREX) 2.5 MG tablet Take 20 mg by mouth once a week. Take 8 tablets every Friday.    . Multiple Vitamin (MULTIVITAMIN) tablet Take 1 tablet by mouth daily.    . nitroGLYCERIN (NITROSTAT) 0.4 MG SL tablet Place 1 tablet (0.4 mg total) under the tongue every 5 (five) minutes as needed. MAXIMUM OF 3 DOSES. 25 tablet 6  . pantoprazole (PROTONIX) 40 MG tablet TAKE 1 TABLET BY MOUTH  TWICE DAILY 180 tablet 3  . rosuvastatin (CRESTOR) 10 MG tablet Take 1 tablet (10 mg total) by mouth daily. 90 tablet 0  . SYRINGE-NEEDLE, DISP, 3 ML 25G X 5/8" 3 ML MISC Use as instructed with B12 injection. 50 each 11  . triamcinolone cream (KENALOG) 0.5 % Apply 1 application topically 2 (two) times daily as needed (scaling).    . vitamin E 400 UNIT capsule Take 400 Units by mouth every evening.     No current facility-administered medications for this visit.     Allergies:   Ephedrine, Nsaids, Vancomycin, Amoxicillin, Levsin [hyoscyamine sulfate], Naprosyn [naproxen], Other, Penicillins, Shellfish allergy, and Sudafed [pseudoephedrine hcl]    Social History:  The patient  reports that he quit smoking about 36 years ago. His smoking use included cigarettes. He has never used smokeless tobacco. He  reports current alcohol use. He reports that he does not use drugs.   Family History:  [name] family history includes Aneurysm in his son; Cancer in his father; Hypertension in his mother; Lung cancer in his father.    ROS:  Please see the history of present illness.   Otherwise, review of systems are positive for none.   All other systems are reviewed and negative.    PHYSICAL EXAM: VS:  BP (!) 142/72 (BP Location: Left Arm, Patient Position: Sitting, Cuff Size: Normal)   Pulse 62   Ht 5' 8.5" (1.74 m)   Wt 187  lb 8 oz (85 kg)   BMI 28.09 kg/m  , BMI Body mass index is 28.09 kg/m. GEN: Well nourished, well developed, in no acute distress  HEENT: normal  Neck: no JVD, carotid bruits, or masses Cardiac: RRR; no murmurs, rubs, or gallops,no edema  Respiratory:  clear to auscultation bilaterally, normal work of breathing GI: soft, nontender, nondistended, + BS MS: no deformity or atrophy  Skin: warm and dry, Mild rash on the back Neuro:  Strength and sensation are intact Psych: euthymic mood, full affect   EKG:  EKG is ordered today. The ekg ordered today demonstrates normal sinus rhythm with no significant ST or T wave changes.   Recent Labs: 02/27/2018: TSH 3.10 07/10/2018: ALT 18 09/22/2018: BUN 13; Creatinine, Ser 0.85; Hemoglobin 11.7; Platelets 196; Potassium 3.8; Sodium 144    Lipid Panel    Component Value Date/Time   CHOL 87 07/10/2018 0801   TRIG 178.0 (H) 07/10/2018 0801   HDL 31.30 (L) 07/10/2018 0801   CHOLHDL 3 07/10/2018 0801   VLDL 35.6 07/10/2018 0801   LDLCALC 20 07/10/2018 0801      Wt Readings from Last 3 Encounters:  12/18/18 187 lb 8 oz (85 kg)  11/16/18 184 lb 6 oz (83.6 kg)  10/01/18 180 lb 8 oz (81.9 kg)      PAD Screen 05/21/2016  Previous PAD dx? No  Previous surgical procedure? No  Pain with walking? No  Feet/toe relief with dangling? No  Painful, non-healing ulcers? No  Extremities discolored? No      ASSESSMENT AND PLAN:   1.  Coronary artery disease involving native coronary arteries with other forms of angina: He is doing very well at the present time with improvement in symptoms on medical therapy.  No changes in medications are recommended at the present time.    2. Hyperlipidemia: Continue treatment with rosuvastatin 10 mg daily.  Most recent lipid profile in June showed an LDL of 20.  His triglyceride was mildly elevated at 178.  If this does not improve with lifestyle changes, we can consider treatment with Vascepa.  3. Essential hypertension: Blood pressure is mildly elevated but I elected not to make any changes given recent episodes of hypotension and dizziness.    Disposition:   FU with me in 6 months  Signed,  Kathlyn Sacramento, MD  12/18/2018 4:29 PM    [place]

## 2018-12-18 NOTE — Patient Instructions (Signed)

## 2018-12-23 NOTE — Telephone Encounter (Signed)
Called pt to do screening for his lab appt on Friday and he wanted to make sure that we added the test for him to know his blood type. Will you please get this added for him.  Thanks.

## 2018-12-24 NOTE — Telephone Encounter (Signed)
I have added the blood type lab (the test we were told to order).  I am not sure that insurance will cover and need to confirm with lab if they can draw here.  Also, needs to other labs previously ordered.

## 2018-12-24 NOTE — Telephone Encounter (Signed)
Patient would like to have his blood type checked with his labs on Friday. I was not sure how to order.

## 2018-12-24 NOTE — Telephone Encounter (Signed)
Spoke with pt. Advised we cannot draw this lab here but he could have that test drawn and his other labs at [facility]. He does not want to have his blood type checked. He is going to come in tomorrow at our office to be drawn.

## 2018-12-25 ENCOUNTER — Other Ambulatory Visit (INDEPENDENT_AMBULATORY_CARE_PROVIDER_SITE_OTHER): Payer: Medicare Other

## 2018-12-25 ENCOUNTER — Other Ambulatory Visit: Payer: Self-pay

## 2018-12-25 DIAGNOSIS — R7989 Other specified abnormal findings of blood chemistry: Secondary | ICD-10-CM

## 2018-12-25 DIAGNOSIS — D649 Anemia, unspecified: Secondary | ICD-10-CM | POA: Diagnosis not present

## 2018-12-25 DIAGNOSIS — E0822 Diabetes mellitus due to underlying condition with diabetic chronic kidney disease: Secondary | ICD-10-CM | POA: Diagnosis not present

## 2018-12-25 DIAGNOSIS — R945 Abnormal results of liver function studies: Secondary | ICD-10-CM

## 2018-12-25 DIAGNOSIS — Z0000 Encounter for general adult medical examination without abnormal findings: Secondary | ICD-10-CM

## 2018-12-25 DIAGNOSIS — N183 Chronic kidney disease, stage 3 unspecified: Secondary | ICD-10-CM

## 2018-12-25 DIAGNOSIS — E785 Hyperlipidemia, unspecified: Secondary | ICD-10-CM

## 2018-12-25 LAB — LIPID PANEL
Cholesterol: 104 mg/dL (ref 0–200)
HDL: 30.7 mg/dL — ABNORMAL LOW (ref >39.00)
LDL Cholesterol: 49 mg/dL (ref 0–99)
NonHDL: 73.1
Total CHOL/HDL Ratio: 3
Triglycerides: 120 mg/dL (ref 0.0–149.0)
VLDL: 24 mg/dL (ref 0.0–40.0)

## 2018-12-25 LAB — HEMOGLOBIN A1C: Hgb A1c MFr Bld: 6.4 % (ref 4.6–6.5)

## 2018-12-25 LAB — HEPATIC FUNCTION PANEL
ALT: 14 U/L (ref 0–53)
AST: 13 U/L (ref 0–37)
Albumin: 4.1 g/dL (ref 3.5–5.2)
Alkaline Phosphatase: 39 U/L (ref 39–117)
Bilirubin, Direct: 0.1 mg/dL (ref 0.0–0.3)
Total Bilirubin: 0.5 mg/dL (ref 0.2–1.2)
Total Protein: 6.7 g/dL (ref 6.0–8.3)

## 2018-12-25 LAB — BASIC METABOLIC PANEL
BUN: 10 mg/dL (ref 6–23)
CO2: 27 mEq/L (ref 19–32)
Calcium: 9.1 mg/dL (ref 8.4–10.5)
Chloride: 105 mEq/L (ref 96–112)
Creatinine, Ser: 0.81 mg/dL (ref 0.40–1.50)
GFR: 94.15 mL/min (ref >60.00)
Glucose, Bld: 141 mg/dL — ABNORMAL HIGH (ref 70–99)
Potassium: 4 mEq/L (ref 3.5–5.1)
Sodium: 141 mEq/L (ref 135–145)

## 2018-12-25 LAB — CBC WITH DIFFERENTIAL/PLATELET
Basophils Absolute: 0 10*3/uL (ref 0.0–0.1)
Basophils Relative: 0.9 % (ref 0.0–3.0)
Eosinophils Absolute: 0.3 10*3/uL (ref 0.0–0.7)
Eosinophils Relative: 7.7 % — ABNORMAL HIGH (ref 0.0–5.0)
HCT: 37.7 % — ABNORMAL LOW (ref 39.0–52.0)
Hemoglobin: 12.6 g/dL — ABNORMAL LOW (ref 13.0–17.0)
Lymphocytes Relative: 22.8 % (ref 12.0–46.0)
Lymphs Abs: 1 10*3/uL (ref 0.7–4.0)
MCHC: 33.5 g/dL (ref 30.0–36.0)
MCV: 88.7 fl (ref 78.0–100.0)
Monocytes Absolute: 0.5 10*3/uL (ref 0.1–1.0)
Monocytes Relative: 12.3 % — ABNORMAL HIGH (ref 3.0–12.0)
Neutro Abs: 2.5 10*3/uL (ref 1.4–7.7)
Neutrophils Relative %: 56.3 % (ref 43.0–77.0)
Platelets: 185 10*3/uL (ref 150.0–400.0)
RBC: 4.25 Mil/uL (ref 4.22–5.81)
RDW: 17.1 % — ABNORMAL HIGH (ref 11.5–15.5)
WBC: 4.4 10*3/uL (ref 4.0–10.5)

## 2018-12-26 LAB — ABO/RH: Rh Factor: POSITIVE

## 2018-12-26 LAB — SPECIMEN STATUS REPORT

## 2019-01-01 ENCOUNTER — Other Ambulatory Visit: Payer: Self-pay | Admitting: Internal Medicine

## 2019-01-01 DIAGNOSIS — D649 Anemia, unspecified: Secondary | ICD-10-CM

## 2019-01-01 NOTE — Progress Notes (Signed)
Order placed for f/u labs.  

## 2019-01-12 DIAGNOSIS — L4050 Arthropathic psoriasis, unspecified: Secondary | ICD-10-CM | POA: Diagnosis not present

## 2019-01-12 DIAGNOSIS — L409 Psoriasis, unspecified: Secondary | ICD-10-CM | POA: Diagnosis not present

## 2019-01-12 DIAGNOSIS — M25512 Pain in left shoulder: Secondary | ICD-10-CM | POA: Diagnosis not present

## 2019-01-12 DIAGNOSIS — G8929 Other chronic pain: Secondary | ICD-10-CM | POA: Diagnosis not present

## 2019-01-12 DIAGNOSIS — M79642 Pain in left hand: Secondary | ICD-10-CM | POA: Diagnosis not present

## 2019-01-21 DIAGNOSIS — L739 Follicular disorder, unspecified: Secondary | ICD-10-CM | POA: Diagnosis not present

## 2019-01-21 DIAGNOSIS — L578 Other skin changes due to chronic exposure to nonionizing radiation: Secondary | ICD-10-CM | POA: Diagnosis not present

## 2019-01-21 DIAGNOSIS — L570 Actinic keratosis: Secondary | ICD-10-CM | POA: Diagnosis not present

## 2019-01-21 DIAGNOSIS — L920 Granuloma annulare: Secondary | ICD-10-CM | POA: Diagnosis not present

## 2019-01-21 DIAGNOSIS — Z872 Personal history of diseases of the skin and subcutaneous tissue: Secondary | ICD-10-CM | POA: Diagnosis not present

## 2019-02-01 ENCOUNTER — Encounter: Payer: Self-pay | Admitting: Internal Medicine

## 2019-02-02 NOTE — Telephone Encounter (Signed)
Advised. Pt is going to call rheumatology

## 2019-02-02 NOTE — Telephone Encounter (Signed)
Agree.  He can also see if his rheumatologist has any recommendation, since he prescribes the medication.

## 2019-02-12 ENCOUNTER — Ambulatory Visit (INDEPENDENT_AMBULATORY_CARE_PROVIDER_SITE_OTHER): Payer: Medicare Other

## 2019-02-12 ENCOUNTER — Other Ambulatory Visit: Payer: Self-pay

## 2019-02-12 ENCOUNTER — Other Ambulatory Visit: Payer: Self-pay | Admitting: Internal Medicine

## 2019-02-12 VITALS — Ht 68.5 in | Wt 187.0 lb

## 2019-02-12 DIAGNOSIS — Z125 Encounter for screening for malignant neoplasm of prostate: Secondary | ICD-10-CM

## 2019-02-12 DIAGNOSIS — Z0000 Encounter for general adult medical examination without abnormal findings: Secondary | ICD-10-CM

## 2019-02-12 NOTE — Progress Notes (Addendum)
Subjective:   MONTELL WINGET is a 69 y.o. male who presents for an Initial Medicare Annual Wellness Visit.  Review of Systems  No ROS.  Medicare Wellness Virtual Visit.  Visual/audio telehealth visit, UTA vital signs.   Ht/Wt provided.  See social history for additional risk factors.   Cardiac Risk Factors include: advanced age (>47men, >69 women);hypertension;male gender    Objective:    Today's Vitals   02/12/19 1136  Weight: 187 lb (84.8 kg)  Height: 5' 8.5" (1.74 m)   Body mass index is 28.02 kg/m.  Advanced Directives 02/12/2019 09/22/2018 09/14/2018 02/13/2018 01/01/2018 12/26/2017 07/07/2017  Does Patient Have a Medical Advance Directive? Yes Yes Yes Yes Yes Yes Yes  Type of Industrial/product designer of [employer] Power of [place];Living will Living will [place];Living will Living will;Healthcare Power of [place];Living will  Does patient want to make changes to medical advance directive? No - Patient declined No - Patient declined - - - No - Patient declined -  Copy of [place] in Chart? No - copy requested No - copy requested No - copy requested - - No - copy requested -  Would patient like information on creating a medical advance directive? - No - Patient declined - - - - -    Current Medications (verified) Outpatient Encounter Medications as of 02/12/2019  Medication Sig  . acetaminophen (TYLENOL) 500 MG tablet Take 1,000 mg by mouth daily as needed for moderate pain.  Marland Kitchen aspirin EC 81 MG tablet Take 81 mg by mouth every evening.   Marland Kitchen atenolol (TENORMIN) 50 MG tablet TAKE 1 TABLET BY MOUTH  TWICE DAILY  . cyanocobalamin (,VITAMIN B-12,) 1000 MCG/ML injection Inject 1 mL (1,000 mcg total) into the muscle every 30 (thirty) days.  . folic acid (FOLVITE) 1 MG tablet Take 1 mg by mouth every evening.   . gabapentin (NEURONTIN) 300 MG capsule TAKE 1 TO 2  CAPSULES BY  MOUTH AT BEDTIME  . isosorbide mononitrate (IMDUR) 30 MG 24 hr tablet Take 1 tablet (30 mg total) by mouth daily.  Marland Kitchen losartan (COZAAR) 25 MG tablet Take 0.5 tablets (12.5 mg total) by mouth daily.  . metFORMIN (GLUCOPHAGE) 500 MG tablet TAKE 2 TABLETS BY MOUTH 2  TIMES DAILY WITH MEALS. (Patient taking differently: Take 1,000 mg by mouth 2 (two) times daily with a meal. )  . methotrexate (RHEUMATREX) 2.5 MG tablet Take 20 mg by mouth once a week. Take 8 tablets every Friday.  . Multiple Vitamin (MULTIVITAMIN) tablet Take 1 tablet by mouth daily.  . nitroGLYCERIN (NITROSTAT) 0.4 MG SL tablet Place 1 tablet (0.4 mg total) under the tongue every 5 (five) minutes as needed. MAXIMUM OF 3 DOSES.  Marland Kitchen pantoprazole (PROTONIX) 40 MG tablet TAKE 1 TABLET BY MOUTH  TWICE DAILY  . rosuvastatin (CRESTOR) 10 MG tablet Take 1 tablet (10 mg total) by mouth daily.  . SYRINGE-NEEDLE, DISP, 3 ML 25G X 5/8" 3 ML MISC Use as instructed with B12 injection.  . triamcinolone cream (KENALOG) 0.5 % Apply 1 application topically 2 (two) times daily as needed (scaling).  . vitamin E 400 UNIT capsule Take 400 Units by mouth every evening.  [place] SENSOREADY PEN 150 MG/ML SOAJ Inject 300 mg into the skin every 30 (thirty) days.    No facility-administered encounter medications on file as of 02/12/2019.    Allergies (verified) Ephedrine, Nsaids, Vancomycin, Amoxicillin, Levsin [  hyoscyamine sulfate], Naprosyn [naproxen], Other, Penicillins, Shellfish allergy, and Sudafed [pseudoephedrine hcl]   History: Past Medical History:  Diagnosis Date  . Abnormal liver function tests   . Allergic state   . Anemia   . Arthritis    spine, hands, shoulder with previous cuff tear  . CAD (coronary artery disease)    a.  11/2015 CT Chest/Abd: Coronary Ca2+ noted;  b. MV: EF 45-54%, med defect of mod severity in basal infsept, basal inf, mid infsept, and mid inf region-> infarct and peri-infarct ischemia-->intermediate  risk; c. 05/2016 Cath/PCI: LM 40ost, LAD 30p, 66m, D1 60ost, LCX 60ost, OM1 99 (small->Med Rx), RCA 60p, 95d (2.5x15 Resolute Onyx DES), RPDA/RPLB min irregs, D2 60, EF 55-65%/  . Candidiasis of esophagus ([place])   . Colon polyp   . DDD (degenerative disc disease), lumbosacral   . Dysphagia   . Elevated transaminase level   . Esophageal stricture    a. s/p multiple dilations - last 03/2016.  Marland Kitchen Esophagitis, [place] grade D   . Ganglion cyst of finger of right hand   . GERD (gastroesophageal reflux disease)   . Guaiac positive stools   . Hiatal hernia   . HNP (herniated nucleus pulposus), lumbar   . Hypercholesterolemia   . Hypertension   . Lumbar radiculitis   . Mallory-Weiss tear    a. 11/2015.  . Migraine aura without headache    migraine, visual  . Psoriasis    with psoriatic arthritis  . Psoriatic arthritis (HCC)    severe  . Schatzki's ring   . Sleep apnea    does not uses CPAP. has lost weight so is much better.  . Type II diabetes mellitus ([place])    Past Surgical History:  Procedure Laterality Date  . ARTHROPLASTY     right thumb; left thumb with 2 screws and plastic joint  . BALLOON DILATION N/A 05/29/2015   Procedure: BALLOON DILATION;  Surgeon: Lollie Sails, MD;  Location: [facility] ENDOSCOPY;  Service: Endoscopy;  Laterality: N/A;  . BALLOON DILATION N/A 02/14/2017   Procedure: BALLOON DILATION;  Surgeon: Lollie Sails, MD;  Location: [facility] ENDOSCOPY;  Service: Endoscopy;  Laterality: N/A;  . BILATERAL CARPAL TUNNEL RELEASE Bilateral 07/04/2017   Procedure: BILATERAL CARPAL TUNNEL RELEASE;  Surgeon: Leanor Kail, MD;  Location: [facility] ORS;  Service: Orthopedics;  Laterality: Bilateral;  . BLEPHAROPLASTY    . CARDIAC CATHETERIZATION     1 stent   . CARPAL TUNNEL RELEASE  2005   right  . CHOLECYSTECTOMY  2008  . COLONOSCOPY    . COLONOSCOPY WITH PROPOFOL N/A 02/13/2018   Procedure: COLONOSCOPY WITH PROPOFOL;  Surgeon: Lollie Sails, MD;  Location: [facility]  ENDOSCOPY;  Service: Endoscopy;  Laterality: N/A;  . CORONARY STENT INTERVENTION N/A 06/14/2016   Procedure: Coronary Stent Intervention;  Surgeon: Wellington Hampshire, MD;  Location: [place] CV LAB;  Service: Cardiovascular;  Laterality: N/A;  . CYST EXCISION  04/14/2015   tendon sheath cyst excision; right ring finger cyst removed and trigger finger realease  . ESOPHAGOGASTRODUODENOSCOPY    . ESOPHAGOGASTRODUODENOSCOPY (EGD) WITH PROPOFOL N/A 05/29/2015   Procedure: ESOPHAGOGASTRODUODENOSCOPY (EGD) WITH PROPOFOL;  Surgeon: Lollie Sails, MD;  Location: [facility] ENDOSCOPY;  Service: Endoscopy;  Laterality: N/A;  . ESOPHAGOGASTRODUODENOSCOPY (EGD) WITH PROPOFOL N/A 07/04/2015   Procedure: ESOPHAGOGASTRODUODENOSCOPY (EGD) WITH PROPOFOL;  Surgeon: Lollie Sails, MD;  Location: [facility] ENDOSCOPY;  Service: Endoscopy;  Laterality: N/A;  . ESOPHAGOGASTRODUODENOSCOPY (EGD) WITH PROPOFOL N/A 01/04/2016   Procedure:  ESOPHAGOGASTRODUODENOSCOPY (EGD) WITH PROPOFOL;  Surgeon: Lollie Sails, MD;  Location: [facility] ENDOSCOPY;  Service: Endoscopy;  Laterality: N/A;  . ESOPHAGOGASTRODUODENOSCOPY (EGD) WITH PROPOFOL N/A 03/22/2016   Procedure: ESOPHAGOGASTRODUODENOSCOPY (EGD) WITH PROPOFOL;  Surgeon: Lollie Sails, MD;  Location: [facility] ENDOSCOPY;  Service: Endoscopy;  Laterality: N/A;  . ESOPHAGOGASTRODUODENOSCOPY (EGD) WITH PROPOFOL N/A 02/14/2017   Procedure: ESOPHAGOGASTRODUODENOSCOPY (EGD) WITH PROPOFOL;  Surgeon: Lollie Sails, MD;  Location: [facility] ENDOSCOPY;  Service: Endoscopy;  Laterality: N/A;  . ESOPHAGOGASTRODUODENOSCOPY (EGD) WITH PROPOFOL N/A 04/11/2017   Procedure: ESOPHAGOGASTRODUODENOSCOPY (EGD) WITH PROPOFOL;  Surgeon: Lollie Sails, MD;  Location: [facility] ENDOSCOPY;  Service: Endoscopy;  Laterality: N/A;  . ESOPHAGOGASTRODUODENOSCOPY (EGD) WITH PROPOFOL N/A 07/07/2017   Procedure: ESOPHAGOGASTRODUODENOSCOPY (EGD) WITH PROPOFOL;  Surgeon: Lucilla Lame, MD;  Location: [facility] ENDOSCOPY;  Service:  Endoscopy;  Laterality: N/A;  . ESOPHAGOGASTRODUODENOSCOPY (EGD) WITH PROPOFOL N/A 02/13/2018   Procedure: ESOPHAGOGASTRODUODENOSCOPY (EGD) WITH PROPOFOL;  Surgeon: Lollie Sails, MD;  Location: [facility] ENDOSCOPY;  Service: Endoscopy;  Laterality: N/A;  . EYE SURGERY    . FOREIGN BODY REMOVAL N/A 12/03/2015   Procedure: FOREIGN BODY REMOVAL;  Surgeon: Wilford Corner, MD;  Location: [facility] ENDOSCOPY;  Service: Endoscopy;  Laterality: N/A;  . HERNIA REPAIR Right 1970   inguinal  . INGUINAL HERNIA REPAIR Left 01/01/2018   Procedure: OPEN HERNIA REPAIR INGUINAL ADULT;  Surgeon: Olean Ree, MD;  Location: [facility] ORS;  Service: General;  Laterality: Left;  . LEFT HEART CATH Left 06/14/2016   Procedure: Left Heart Cath;  Surgeon: Wellington Hampshire, MD;  Location: [place] CV LAB;  Service: Cardiovascular;  Laterality: Left;  . LEFT HEART CATH AND CORONARY ANGIOGRAPHY Left 09/14/2018   Procedure: LEFT HEART CATH AND CORONARY ANGIOGRAPHY;  Surgeon: Wellington Hampshire, MD;  Location: [place] CV LAB;  Service: Cardiovascular;  Laterality: Left;  . LUMBAR DISC SURGERY  2004   injections; herniated disc; percutaneous discectomy  . NASAL SINUS SURGERY  2008  . percutaneous lumbar discectomy    . ROTATOR CUFF REPAIR Right   . TONSILLECTOMY     Family History  Problem Relation Age of Onset  . Lung cancer Father   . Cancer Father   . Hypertension Mother   . Aneurysm Son    Social History   Socioeconomic History  . Marital status: Married    Spouse name: Pamala Hurry  . Number of children: Not on file  . Years of education: Not on file  . Highest education level: Not on file  Occupational History    Comment: not heavy work  Tobacco Use  . Smoking status: Former Smoker    Types: Cigarettes    Quit date: 07/06/1982    Years since quitting: 36.6  . Smokeless tobacco: Never Used  Substance and Sexual Activity  . Alcohol use: Yes    Alcohol/week: 0.0 standard drinks  . Drug use: No  .  Sexual activity: Not on file  Other Topics Concern  . Not on file  Social History Narrative   Lives locally with wife.  Active but does not routinely exercise.   Social Determinants of Health   Financial Resource Strain:   . Difficulty of Paying Living Expenses: Not on file  Food Insecurity:   . Worried About Charity fundraiser in the Last Year: Not on file  . Ran Out of Food in the Last Year: Not on file  Transportation Needs:   . Lack of Transportation (Medical): Not on file  . Lack of  Transportation (Non-Medical): Not on file  Physical Activity:   . Days of Exercise per Week: Not on file  . Minutes of Exercise per Session: Not on file  Stress:   . Feeling of Stress : Not on file  Social Connections:   . Frequency of Communication with Friends and Family: Not on file  . Frequency of Social Gatherings with Friends and Family: Not on file  . Attends Religious Services: Not on file  . Active Member of Clubs or Organizations: Not on file  . Attends Archivist Meetings: Not on file  . Marital Status: Not on file   Tobacco Counseling Counseling given: Not Answered   Clinical Intake:  Pre-visit preparation completed: Yes        Diabetes: Yes(Followed by pcp)  How often do you need to have someone help you when you read instructions, pamphlets, or other written materials from your doctor or pharmacy?: 1 - Never  Interpreter Needed?: No     Activities of Daily Living In your present state of health, do you have any difficulty performing the following activities: 02/12/2019 09/14/2018  Hearing? N N  Vision? N N  Difficulty concentrating or making decisions? N N  Walking or climbing stairs? N N  Dressing or bathing? N N  Doing errands, shopping? N -  Preparing Food and eating ? N -  Using the Toilet? N -  In the past six months, have you accidently leaked urine? N -  Do you have problems with loss of bowel control? N -  Managing your Medications? N -    Managing your Finances? N -  Housekeeping or managing your Housekeeping? N -  Some recent data might be hidden     Immunizations and Health Maintenance Immunization History  Administered Date(s) Administered  . Pneumococcal Polysaccharide-23 11/22/2016  . Td 11/22/2016   Health Maintenance Due  Topic Date Due  . PNA vac Low Risk Adult (2 of 2 - PCV13) 11/22/2017  . FOOT EXAM  02/21/2018    Patient Care Team: Einar Pheasant, MD as PCP - General (Internal Medicine) Wellington Hampshire, MD as PCP - Cardiology (Cardiology)  Indicate any recent Medical Services you may have received from other than [facility] providers in the past year (date may be approximate).    Assessment:   This is a routine wellness examination for Abad.  Nurse connected with patient 02/12/19 at 11:30 AM EST by a telephone enabled telemedicine application and verified that I am speaking with the correct person using two identifiers. Patient stated full name and DOB. Patient gave permission to continue with virtual visit. Patient's location was at home and Nurse's location was at [place] office.   Patient is alert and oriented x3. Patient denies difficulty focusing or concentrating. Patient , play computer games, complete puzzles for brain stimulation.   Health Maintenance Due: -Foot Exam- followed by pcp -Hgb A1c- 12/25/19 @ (6.4) Prevnar 13 vaccine- discussed   See completed HM at the end of note.   Eye: Visual acuity not assessed. Virtual visit. Followed by their ophthalmologist.  Retinopathy- none reported.  Dental: Dentures- yes    Hearing: Demonstrates normal hearing during visit.  Safety:  Patient feels safe at home- yes Patient does have smoke detectors at home- yes Patient does wear sunscreen or protective clothing when in direct sunlight - yes Patient does wear seat belt when in a moving vehicle - yes Patient drives- yes Adequate lighting in walkways free from debris- yes Grab bars and  handrails used as appropriate- yes Ambulates with an assistive device- no Cell phone on person when ambulating outside of the home- yes  Social: Alcohol intake -yes      Smoking history- former   Smokers in home? none Illicit drug use? none  Medication: Taking as directed and without issues.  Pill box in use -yes  Self managed - yes   Covid-19: Precautions and sickness symptoms discussed. Wears mask, social distancing, hand hygiene as appropriate.   Activities of Daily Living Patient denies needing assistance with: household chores, feeding themselves, getting from bed to chair, getting to the toilet, bathing/showering, dressing, managing money, or preparing meals.   Discussed the importance of a healthy diet, water intake and the benefits of aerobic exercise.  Physical activity- currently works 5 days weekly.  Diet:  Diabetic Water: good intake  Other Providers Patient Care Team: Einar Pheasant, MD as PCP - General (Internal Medicine) Wellington Hampshire, MD as PCP - Cardiology (Cardiology)  Hearing/Vision screen  Hearing Screening   125Hz  250Hz  500Hz  1000Hz  2000Hz  3000Hz  4000Hz  6000Hz  8000Hz   Right ear:           Left ear:           Comments: Patient is able to hear conversational tones without difficulty.  No issues reported.  Vision Screening Comments: Wears corrective lenses Visual acuity not assessed, virtual visit.  They have seen their ophthalmologist in the last 12 months.     Dietary issues and exercise activities discussed: Current Exercise Habits: Home exercise routine, Intensity: Mild  Goals    . Follow up with Primary Care Provider     As needed      Depression Screen PHQ 2/9 Scores 02/12/2019 09/22/2018 02/21/2017 02/20/2016  PHQ - 2 Score 0 0 0 0    Fall Risk Fall Risk  02/12/2019 11/16/2018 01/23/2018 01/02/2018 12/19/2017  Falls in the past year? 0 0 0 0 0  Number falls in past yr: - 0 0 - 0  Injury with Fall? - 0 0 - 0  Follow up Falls evaluation  completed Falls evaluation completed - - -   Timed Get Up and Go performed: no, virtual visit  Cognitive Function:     6CIT Screen 02/12/2019  What Year? 0 points  What month? 0 points  What time? 0 points  Count back from 20 0 points  Months in reverse 0 points  Repeat phrase 0 points  Total Score 0    Screening Tests Health Maintenance  Topic Date Due  . PNA vac Low Risk Adult (2 of 2 - PCV13) 11/22/2017  . FOOT EXAM  02/21/2018  . HEMOGLOBIN A1C  06/25/2019  . OPHTHALMOLOGY EXAM  08/26/2019  . TETANUS/TDAP  11/23/2026  . COLONOSCOPY  02/14/2028  . Hepatitis C Screening  Completed  . INFLUENZA VACCINE  Discontinued      Plan:   Keep all routine maintenance appointments.   Labs 02/26/19 @ 2:00. PSA added per patient request.   Cpe 03/05/19 @ 8:30  Medicare Attestation I have personally reviewed: [name] medical and social history Their use of alcohol, tobacco or illicit drugs Their current medications and supplements [name] functional ability including ADLs,fall risks, home safety risks, cognitive, and hearing and visual impairment Diet and physical activities Evidence for depression   I have reviewed and discussed with patient certain preventive protocols, quality metrics, and best practice recommendations.      Varney Biles, LPN   [id number]     Reviewed  above information.  Agree with assessment and plan.   Dr Nicki Reaper

## 2019-02-12 NOTE — Patient Instructions (Addendum)
  Shane Mcknight , Thank you for taking time to come for your Medicare Wellness Visit. I appreciate your ongoing commitment to your health goals. Please review the following plan we discussed and let me know if I can assist you in the future.   These are the goals we discussed: Goals    . Follow up with Primary Care Provider     As needed       This is a list of the screening recommended for you and due dates:  Health Maintenance  Topic Date Due  . Pneumonia vaccines (2 of 2 - PCV13) 11/22/2017  . Complete foot exam   02/21/2018  . Hemoglobin A1C  06/25/2019  . Eye exam for diabetics  08/26/2019  . Tetanus Vaccine  11/23/2026  . Colon Cancer Screening  02/14/2028  .  Hepatitis C: One time screening is recommended by Center for Disease Control  (CDC) for  adults born from [birth year] through 1967.   Completed  . Flu Shot  Discontinued

## 2019-02-12 NOTE — Progress Notes (Signed)
Order placed for psa.  

## 2019-02-16 ENCOUNTER — Other Ambulatory Visit: Payer: Self-pay | Admitting: Cardiovascular Disease

## 2019-02-26 ENCOUNTER — Other Ambulatory Visit: Payer: Self-pay

## 2019-02-26 ENCOUNTER — Other Ambulatory Visit (INDEPENDENT_AMBULATORY_CARE_PROVIDER_SITE_OTHER): Payer: Medicare Other

## 2019-02-26 DIAGNOSIS — Z125 Encounter for screening for malignant neoplasm of prostate: Secondary | ICD-10-CM | POA: Diagnosis not present

## 2019-02-26 DIAGNOSIS — D649 Anemia, unspecified: Secondary | ICD-10-CM

## 2019-02-26 LAB — CBC WITH DIFFERENTIAL/PLATELET
Basophils Absolute: 0 10*3/uL (ref 0.0–0.1)
Basophils Relative: 0.5 % (ref 0.0–3.0)
Eosinophils Absolute: 0.3 10*3/uL (ref 0.0–0.7)
Eosinophils Relative: 5.9 % — ABNORMAL HIGH (ref 0.0–5.0)
HCT: 36.7 % — ABNORMAL LOW (ref 39.0–52.0)
Hemoglobin: 12.7 g/dL — ABNORMAL LOW (ref 13.0–17.0)
Lymphocytes Relative: 23.9 % (ref 12.0–46.0)
Lymphs Abs: 1.3 10*3/uL (ref 0.7–4.0)
MCHC: 34.6 g/dL (ref 30.0–36.0)
MCV: 87.2 fl (ref 78.0–100.0)
Monocytes Absolute: 0.6 10*3/uL (ref 0.1–1.0)
Monocytes Relative: 11.2 % (ref 3.0–12.0)
Neutro Abs: 3.3 10*3/uL (ref 1.4–7.7)
Neutrophils Relative %: 58.5 % (ref 43.0–77.0)
Platelets: 187 10*3/uL (ref 150.0–400.0)
RBC: 4.21 Mil/uL — ABNORMAL LOW (ref 4.22–5.81)
RDW: 16.6 % — ABNORMAL HIGH (ref 11.5–15.5)
WBC: 5.6 10*3/uL (ref 4.0–10.5)

## 2019-02-26 LAB — PSA, MEDICARE: PSA: 1.82 ng/ml (ref 0.10–4.00)

## 2019-02-26 LAB — IBC + FERRITIN
Ferritin: 10.1 ng/mL — ABNORMAL LOW (ref 22.0–322.0)
Iron: 59 ug/dL (ref 42–165)
Saturation Ratios: 17.1 % — ABNORMAL LOW (ref 20.0–50.0)
Transferrin: 247 mg/dL (ref 212.0–360.0)

## 2019-02-26 LAB — VITAMIN B12: Vitamin B-12: 175 pg/mL — ABNORMAL LOW (ref 211–911)

## 2019-03-03 ENCOUNTER — Telehealth: Payer: Self-pay | Admitting: *Deleted

## 2019-03-03 NOTE — Telephone Encounter (Signed)
Pt moved OV to 03/19/19, please review lab results

## 2019-03-04 ENCOUNTER — Encounter: Payer: Self-pay | Admitting: Internal Medicine

## 2019-03-04 NOTE — Telephone Encounter (Signed)
Noted  

## 2019-03-04 NOTE — Telephone Encounter (Signed)
See result note for recommendation

## 2019-03-05 ENCOUNTER — Encounter: Payer: Medicare Other | Admitting: Internal Medicine

## 2019-03-05 NOTE — Telephone Encounter (Signed)
See result note.  

## 2019-03-06 ENCOUNTER — Other Ambulatory Visit: Payer: Self-pay | Admitting: Internal Medicine

## 2019-03-06 DIAGNOSIS — D509 Iron deficiency anemia, unspecified: Secondary | ICD-10-CM

## 2019-03-06 NOTE — Progress Notes (Signed)
Order placed for GI referral.   

## 2019-03-07 ENCOUNTER — Other Ambulatory Visit: Payer: Self-pay | Admitting: Internal Medicine

## 2019-03-12 ENCOUNTER — Other Ambulatory Visit: Payer: Self-pay

## 2019-03-12 ENCOUNTER — Telehealth: Payer: Self-pay | Admitting: Internal Medicine

## 2019-03-12 MED ORDER — METHOTREXATE 2.5 MG PO TABS: 20.0000 mg | ORAL_TABLET | ORAL | 0 refills | Status: DC

## 2019-03-12 NOTE — Telephone Encounter (Signed)
Pt called [facility] to get methotrexate to get refilled but they never received the prescription from Dr. Jefm Bryant. He is wondering since he is completely out could Dr. Nicki Reaper send this in for him to [facility]. He needs to take this today and can not get in touch with anyone at Dr. Scharlene Gloss office.

## 2019-03-12 NOTE — Telephone Encounter (Signed)
Called rheumatology and no one is in the office today.  Since he is out, I can do a one time refill, but he will need to contact Dr Jefm Bryant next week to get more refills.

## 2019-03-12 NOTE — Telephone Encounter (Signed)
Per review of meds, MTX sent in today.

## 2019-03-12 NOTE — Telephone Encounter (Signed)
Patient is taking 2.5 mg- 8 tablets every Friday. He will call them next week. He wants it sent to [facility] in [place].

## 2019-03-16 ENCOUNTER — Encounter: Payer: Self-pay | Admitting: Internal Medicine

## 2019-03-18 ENCOUNTER — Other Ambulatory Visit: Payer: Self-pay | Admitting: Physician Assistant

## 2019-03-18 DIAGNOSIS — I6523 Occlusion and stenosis of bilateral carotid arteries: Secondary | ICD-10-CM

## 2019-03-19 ENCOUNTER — Other Ambulatory Visit: Payer: Self-pay

## 2019-03-19 ENCOUNTER — Ambulatory Visit (INDEPENDENT_AMBULATORY_CARE_PROVIDER_SITE_OTHER): Payer: Medicare Other

## 2019-03-19 ENCOUNTER — Ambulatory Visit: Payer: Medicare Other | Admitting: Internal Medicine

## 2019-03-19 ENCOUNTER — Telehealth: Payer: Self-pay | Admitting: Internal Medicine

## 2019-03-19 DIAGNOSIS — I6523 Occlusion and stenosis of bilateral carotid arteries: Secondary | ICD-10-CM | POA: Diagnosis not present

## 2019-03-22 ENCOUNTER — Telehealth: Payer: Self-pay

## 2019-03-22 NOTE — Telephone Encounter (Signed)
Call to patient to discuss carotid u/s results.   No further orders or questions at this time. Continue with current POC.   Advised pt to call for any further questions or concerns.

## 2019-03-22 NOTE — Telephone Encounter (Signed)
-----   Message from Rise Mu, PA-C sent at 03/19/2019 10:17 AM EST ----- Carotid artery ultrasound showed right side narrowing of 1-39% and left side narrowing of 40-59%. Compared to study from 03/2017, findings are unchanged. Continue current medical management and risk factor modification. Follow up imaging in 12 months.

## 2019-04-01 ENCOUNTER — Telehealth: Payer: Self-pay | Admitting: Cardiovascular Disease

## 2019-04-01 NOTE — Telephone Encounter (Signed)
Patient received a call to discuss refill   Patient does not need rosuvastatin at this time. He will call when ready for refill

## 2019-04-12 ENCOUNTER — Other Ambulatory Visit: Payer: Self-pay | Admitting: Internal Medicine

## 2019-04-12 ENCOUNTER — Other Ambulatory Visit: Payer: Self-pay | Admitting: Cardiovascular Disease

## 2019-04-23 ENCOUNTER — Encounter: Payer: Medicare Other | Admitting: Internal Medicine

## 2019-04-23 DIAGNOSIS — R1310 Dysphagia, unspecified: Secondary | ICD-10-CM | POA: Diagnosis not present

## 2019-04-23 DIAGNOSIS — K2100 Gastro-esophageal reflux disease with esophagitis, without bleeding: Secondary | ICD-10-CM | POA: Diagnosis not present

## 2019-04-23 NOTE — Telephone Encounter (Signed)
Opened in Error.

## 2019-05-07 ENCOUNTER — Other Ambulatory Visit: Payer: Self-pay

## 2019-05-07 ENCOUNTER — Encounter: Payer: Self-pay | Admitting: Internal Medicine

## 2019-05-07 ENCOUNTER — Ambulatory Visit (INDEPENDENT_AMBULATORY_CARE_PROVIDER_SITE_OTHER): Payer: Medicare Other | Admitting: Internal Medicine

## 2019-05-07 VITALS — BP 138/78 | HR 74 | Temp 97.0°F | Resp 16 | Ht 69.0 in | Wt 183.0 lb

## 2019-05-07 DIAGNOSIS — I10 Essential (primary) hypertension: Secondary | ICD-10-CM

## 2019-05-07 DIAGNOSIS — K219 Gastro-esophageal reflux disease without esophagitis: Secondary | ICD-10-CM

## 2019-05-07 DIAGNOSIS — E785 Hyperlipidemia, unspecified: Secondary | ICD-10-CM | POA: Diagnosis not present

## 2019-05-07 DIAGNOSIS — R945 Abnormal results of liver function studies: Secondary | ICD-10-CM

## 2019-05-07 DIAGNOSIS — E0822 Diabetes mellitus due to underlying condition with diabetic chronic kidney disease: Secondary | ICD-10-CM

## 2019-05-07 DIAGNOSIS — R7989 Other specified abnormal findings of blood chemistry: Secondary | ICD-10-CM

## 2019-05-07 DIAGNOSIS — Z0000 Encounter for general adult medical examination without abnormal findings: Secondary | ICD-10-CM

## 2019-05-07 DIAGNOSIS — R1319 Other dysphagia: Secondary | ICD-10-CM

## 2019-05-07 DIAGNOSIS — R1310 Dysphagia, unspecified: Secondary | ICD-10-CM

## 2019-05-07 DIAGNOSIS — L4050 Arthropathic psoriasis, unspecified: Secondary | ICD-10-CM | POA: Diagnosis not present

## 2019-05-07 DIAGNOSIS — D649 Anemia, unspecified: Secondary | ICD-10-CM

## 2019-05-07 DIAGNOSIS — I25118 Atherosclerotic heart disease of native coronary artery with other forms of angina pectoris: Secondary | ICD-10-CM

## 2019-05-07 DIAGNOSIS — N183 Chronic kidney disease, stage 3 unspecified: Secondary | ICD-10-CM

## 2019-05-07 NOTE — Progress Notes (Signed)
Patient ID: WADELL CRADDOCK, male   DOB: 23-Dec-1950, 69 y.o.   MRN: [id number]   Subjective:    Patient ID: [name], male    DOB: 1950/10/05, 69 y.o.   MRN: [id number]  HPI This visit occurred during the SARS-CoV-2 public health emergency.  Safety protocols were in place, including screening questions prior to the visit, additional usage of staff PPE, and extensive cleaning of exam room while observing appropriate contact time as indicated for disinfecting solutions.  Patient here for his physical exam. He reports he is doing relatively well.  Stays active.  Some increased drainage.  Taking liquid tussin.  Hip limits his activity.  Taking protonix bid.  Seeing GI.  Not eating meats.  Still has to watch what he is eating.  No vomiting.  No abdominal pain. Bowels stable.  Sugars doing well.  Blood pressures doing well.  Taking imdur.  Sees cardiology.  Has ntg to take prn.  Overall feels heart is stable.   Past Medical History:  Diagnosis Date  . Abnormal liver function tests   . Allergic state   . Anemia   . Arthritis    spine, hands, shoulder with previous cuff tear  . CAD (coronary artery disease)    a.  11/2015 CT Chest/Abd: Coronary Ca2+ noted;  b. MV: EF 45-54%, med defect of mod severity in basal infsept, basal inf, mid infsept, and mid inf region-> infarct and peri-infarct ischemia-->intermediate risk; c. 05/2016 Cath/PCI: LM 40ost, LAD 30p, 15m D1 60ost, LCX 60ost, OM1 99 (small->Med Rx), RCA 60p, 95d (2.5x15 Resolute Onyx DES), RPDA/RPLB min irregs, D2 60, EF 55-65%/  . Candidiasis of esophagus ([place]   . Colon polyp   . DDD (degenerative disc disease), lumbosacral   . Dysphagia   . Elevated transaminase level   . Esophageal stricture    a. s/p multiple dilations - last 03/2016.  .Marland KitchenEsophagitis, [place] grade D   . Ganglion cyst of finger of right hand   . GERD (gastroesophageal reflux disease)   . Guaiac positive stools   . Hiatal hernia   . HNP (herniated nucleus pulposus),  lumbar   . Hypercholesterolemia   . Hypertension   . Lumbar radiculitis   . Mallory-Weiss tear    a. 11/2015.  . Migraine aura without headache    migraine, visual  . Psoriasis    with psoriatic arthritis  . Psoriatic arthritis (HCC)    severe  . Schatzki's ring   . Sleep apnea    does not uses CPAP. has lost weight so is much better.  . [place] diabetes mellitus [place]    Past Surgical History:  Procedure Laterality Date  . ARTHROPLASTY     right thumb; left thumb with 2 screws and plastic joint  . BALLOON DILATION N/A 05/29/2015   Procedure: BALLOON DILATION;  Surgeon: MLollie Sails MD;  [facility]  Service: Endoscopy;  Laterality: N/A;  . BALLOON DILATION N/A 02/14/2017   Procedure: BALLOON DILATION;  Surgeon: SLollie Sails MD;  [facility];  Service: Endoscopy;  Laterality: N/A;  . BILATERAL CARPAL TUNNEL RELEASE Bilateral 07/04/2017   Procedure: BILATERAL CARPAL TUNNEL RELEASE;  Surgeon: KLeanor Kail MD;  Location: [facility] ORS;  Service: Orthopedics;  Laterality: Bilateral;  . BLEPHAROPLASTY    . CARDIAC CATHETERIZATION     1 stent   . CARPAL TUNNEL RELEASE  2005   right  . CHOLECYSTECTOMY  2008  . COLONOSCOPY    . COLONOSCOPY  WITH PROPOFOL N/A 02/13/2018   Procedure: COLONOSCOPY WITH PROPOFOL;  Surgeon: Lollie Sails, MD;  Location: [facility] ENDOSCOPY;  Service: Endoscopy;  Laterality: N/A;  . CORONARY STENT INTERVENTION N/A 06/14/2016   Procedure: Coronary Stent Intervention;  Surgeon: Wellington Hampshire, MD;  Location: [place] CV LAB;  Service: Cardiovascular;  Laterality: N/A;  . CYST EXCISION  04/14/2015   tendon sheath cyst excision; right ring finger cyst removed and trigger finger realease  . ESOPHAGOGASTRODUODENOSCOPY    . ESOPHAGOGASTRODUODENOSCOPY (EGD) WITH PROPOFOL N/A 05/29/2015   Procedure: ESOPHAGOGASTRODUODENOSCOPY (EGD) WITH PROPOFOL;  Surgeon: Lollie Sails, MD;  Location[facility];  Service: Endoscopy;   Laterality: N/A;  . ESOPHAGOGASTRODUODENOSCOPY (EGD) WITH PROPOFOL N/A 07/04/2015   Procedure: ESOPHAGOGASTRODUODENOSCOPY (EGD) WITH PROPOFOL;  Surgeon: Lollie Sails, MD;  Location: [facility] ENDOSCOPY;  Service: Endoscopy;  Laterality: N/A;  . ESOPHAGOGASTRODUODENOSCOPY (EGD) WITH PROPOFOL N/A 01/04/2016   Procedure: ESOPHAGOGASTRODUODENOSCOPY (EGD) WITH PROPOFOL;  Surgeon: Lollie Sails, MD;  Location[facility] ENDOSCOPY;  Service: Endoscopy;  Laterality: N/A;  . ESOPHAGOGASTRODUODENOSCOPY (EGD) WITH PROPOFOL N/A 03/22/2016   Procedure: ESOPHAGOGASTRODUODENOSCOPY (EGD) WITH PROPOFOL;  Surgeon: Lollie Sails, MD;  Location: [facility] ENDOSCOPY;  Service: Endoscopy;  Laterality: N/A;  . ESOPHAGOGASTRODUODENOSCOPY (EGD) WITH PROPOFOL N/A 02/14/2017   Procedure: ESOPHAGOGASTRODUODENOSCOPY (EGD) WITH PROPOFOL;  Surgeon: Lollie Sails, MD;  Location: [facility] ENDOSCOPY;  Service: Endoscopy;  Laterality: N/A;  . ESOPHAGOGASTRODUODENOSCOPY (EGD) WITH PROPOFOL N/A 04/11/2017   Procedure: ESOPHAGOGASTRODUODENOSCOPY (EGD) WITH PROPOFOL;  Surgeon: Lollie Sails, MD;  Location: [facility] ENDOSCOPY;  Service: Endoscopy;  Laterality: N/A;  . ESOPHAGOGASTRODUODENOSCOPY (EGD) WITH PROPOFOL N/A 07/07/2017   Procedure: ESOPHAGOGASTRODUODENOSCOPY (EGD) WITH PROPOFOL;  Surgeon: Lucilla Lame, MD;  Location: [facility] ENDOSCOPY;  Service: Endoscopy;  Laterality: N/A;  . ESOPHAGOGASTRODUODENOSCOPY (EGD) WITH PROPOFOL N/A 02/13/2018   Procedure: ESOPHAGOGASTRODUODENOSCOPY (EGD) WITH PROPOFOL;  Surgeon: Lollie Sails, MD;  Location: [facility] ENDOSCOPY;  Service: Endoscopy;  Laterality: N/A;  . EYE SURGERY    . FOREIGN BODY REMOVAL N/A 12/03/2015   Procedure: FOREIGN BODY REMOVAL;  Surgeon: Wilford Corner, MD;  [facility] END[facility];  Service: Endoscopy;  Laterality: N/A;  . HERNIA REPAIR Right 1970   inguinal  . INGUINAL HERNIA REPAIR Left 01/01/2018   Procedure: OPEN HERNIA REPAIR INGUINAL ADULT;  Surgeon: Olean Ree, MD;   Location: [facility] ORS;  Service: General;  Laterality: Left;  . LEFT HEART CATH Left 06/14/2016   Procedure: Left Heart Cath;  Surgeon: Wellington Hampshire, MD;  Location: [place] CV LAB;  Service: Cardiovascular;  Laterality: Left;  . LEFT HEART CATH AND CORONARY ANGIOGRAPHY Left 09/14/2018   Procedure: LEFT HEART CATH AND CORONARY ANGIOGRAPHY;  Surgeon: Wellington Hampshire, MD;  Location: [place] CV LAB;  Service: Cardiovascular;  Laterality: Left;  . LUMBAR DISC SURGERY  2004   injections; herniated disc; percutaneous discectomy  . NASAL SINUS SURGERY  2008  . percutaneous lumbar discectomy    . ROTATOR CUFF REPAIR Right   . TONSILLECTOMY     Family History  Problem Relation Age of Onset  . Lung cancer Father   . Cancer Father   . Hypertension Mother   . Aneurysm Son    Social History   Socioeconomic History  . Marital status: Married    Spouse name: Pamala Hurry  . Number of children: Not on file  . Years of education: Not on file  . Highest education level: Not on file  Occupational History    Comment: not heavy work  Tobacco Use  .  Smoking status: Former Smoker    Types: Cigarettes    Quit date: 07/06/1982    Years since quitting: 36.8  . Smokeless tobacco: Never Used  Substance and Sexual Activity  . Alcohol use: Yes    Alcohol/week: 0.0 standard drinks  . Drug use: No  . Sexual activity: Not on file  Other Topics Concern  . Not on file  Social History Narrative   Lives locally with wife.  Active but does not routinely exercise.   Social Determinants of Health   Financial Resource Strain:   . Difficulty of Paying Living Expenses:   Food Insecurity:   . Worried About Charity fundraiser in the Last Year:   . Arboriculturist in the Last Year:   Transportation Needs:   . Film/video editor (Medical):   Marland Kitchen Lack of Transportation (Non-Medical):   Physical Activity:   . Days of Exercise per Week:   . Minutes of Exercise per Session:   Stress:   . Feeling of  Stress :   Social Connections:   . Frequency of Communication with Friends and Family:   . Frequency of Social Gatherings with Friends and Family:   . Attends Religious Services:   . Active Member of Clubs or Organizations:   . Attends Archivist Meetings:   Marland Kitchen Marital Status:     Outpatient Encounter Medications as of 05/07/2019  Medication Sig  . acetaminophen (TYLENOL) 500 MG tablet Take 1,000 mg by mouth daily as needed for moderate pain.  Marland Kitchen aspirin EC 81 MG tablet Take 81 mg by mouth every evening.   Marland Kitchen atenolol (TENORMIN) 50 MG tablet TAKE 1 TABLET BY MOUTH  TWICE DAILY  . COSENTYX SENSOREADY PEN 150 MG/ML SOAJ Inject 300 mg into the skin every 30 (thirty) days.   . cyanocobalamin (,VITAMIN B-12,) 1000 MCG/ML injection Inject 1 mL (1,000 mcg total) into the muscle every 30 (thirty) days.  . folic acid (FOLVITE) 1 MG tablet Take 1 mg by mouth every evening.   . gabapentin (NEURONTIN) 300 MG capsule TAKE 1 TO 2 CAPSULES BY  MOUTH AT BEDTIME  . isosorbide mononitrate (IMDUR) 30 MG 24 hr tablet TAKE 1 TABLET BY MOUTH  DAILY  . losartan (COZAAR) 25 MG tablet Take 0.5 tablets (12.5 mg total) by mouth daily.  . metFORMIN (GLUCOPHAGE) 500 MG tablet TAKE 2 TABLETS BY MOUTH  TWICE DAILY WITH A MEAL  . methotrexate (RHEUMATREX) 2.5 MG tablet Take 8 tablets (20 mg total) by mouth once a week. Take 8 tablets every Friday.  . Multiple Vitamin (MULTIVITAMIN) tablet Take 1 tablet by mouth daily.  . nitroGLYCERIN (NITROSTAT) 0.4 MG SL tablet Place 1 tablet (0.4 mg total) under the tongue every 5 (five) minutes as needed. MAXIMUM OF 3 DOSES.  Marland Kitchen pantoprazole (PROTONIX) 40 MG tablet TAKE 1 TABLET BY MOUTH  TWICE DAILY  . rosuvastatin (CRESTOR) 10 MG tablet Take 1 tablet (10 mg total) by mouth daily.  . SYRINGE-NEEDLE, DISP, 3 ML 25G X 5/8" 3 ML MISC Use as instructed with B12 injection.  . triamcinolone cream (KENALOG) 0.5 % Apply 1 application topically 2 (two) times daily as needed (scaling).    . vitamin E 400 UNIT capsule Take 400 Units by mouth every evening.   No facility-administered encounter medications on file as of 05/07/2019.   Review of Systems  Constitutional: Negative for appetite change and unexpected weight change.  HENT: Positive for postnasal drip. Negative for congestion and sinus pressure.  Eyes: Negative for pain and visual disturbance.  Respiratory: Negative for cough, chest tightness and shortness of breath.   Cardiovascular: Negative for chest pain, palpitations and leg swelling.  Gastrointestinal: Negative for abdominal pain, diarrhea, nausea and vomiting.  Genitourinary: Negative for difficulty urinating and dysuria.  Musculoskeletal: Negative for joint swelling and myalgias.  Skin: Negative for color change and rash.  Neurological: Negative for dizziness, light-headedness and headaches.  Hematological: Negative for adenopathy. Does not bruise/bleed easily.  Psychiatric/Behavioral: Negative for agitation and dysphoric mood.       Objective:    Physical Exam Vitals reviewed.  Constitutional:      General: He is not in acute distress.    Appearance: Normal appearance. He is well-developed.  HENT:     Head: Normocephalic and atraumatic.     Right Ear: External ear normal.     Left Ear: External ear normal.     Mouth/Throat:     Pharynx: No oropharyngeal exudate.  Eyes:     General:        Right eye: No discharge.        Left eye: No discharge.     Conjunctiva/sclera: Conjunctivae normal.  Neck:     Thyroid: No thyromegaly.  Cardiovascular:     Rate and Rhythm: Normal rate and regular rhythm.  Pulmonary:     Effort: No respiratory distress.     Breath sounds: Normal breath sounds. No wheezing.  Abdominal:     General: Bowel sounds are normal.     Palpations: Abdomen is soft.     Tenderness: There is no abdominal tenderness.  Genitourinary:    Comments: Not performed.  Musculoskeletal:        General: No tenderness.     Cervical  back: Neck supple. No tenderness.  Lymphadenopathy:     Cervical: No cervical adenopathy.  Skin:    Findings: No erythema or rash.  Neurological:     Mental Status: He is alert and oriented to person, place, and time.  Psychiatric:        Mood and Affect: Mood normal.        Behavior: Behavior normal.     BP 138/78   Pulse 74   Temp (!) 97 F (36.1 C)   Resp 16   Ht 5' 9" (1.753 m)   Wt 183 lb (83 kg)   SpO2 97%   BMI 27.02 kg/m  Wt Readings from Last 3 Encounters:  05/07/19 183 lb (83 kg)  02/12/19 187 lb (84.8 kg)  12/18/18 187 lb 8 oz (85 kg)     Lab Results  Component Value Date   WBC 5.6 02/26/2019   HGB 12.7 (L) 02/26/2019   HCT 36.7 (L) 02/26/2019   PLT 187.0 02/26/2019   GLUCOSE 141 (H) 12/25/2018   CHOL 104 12/25/2018   TRIG 120.0 12/25/2018   HDL 30.70 (L) 12/25/2018   LDLCALC 49 12/25/2018   ALT 14 12/25/2018   AST 13 12/25/2018   NA 141 12/25/2018   K 4.0 12/25/2018   CL 105 12/25/2018   CREATININE 0.81 12/25/2018   BUN 10 12/25/2018   CO2 27 12/25/2018   TSH 3.10 02/27/2018   PSA 1.82 02/26/2019   INR 1.1 06/07/2016   HGBA1C 6.4 12/25/2018   MICROALBUR 13.6 (H) 02/27/2018    DG Chest 2 View  Result Date: 09/22/2018 CLINICAL DATA:  Approximate one-week history of fever and chest tightness. EXAM: CHEST - 2 VIEW COMPARISON:  06/27/2017 and earlier. FINDINGS: Cardiomediastinal silhouette  unremarkable and unchanged. Suboptimal inspiration which accounts for mild atelectasis in the LEFT LOWER LOBE. Lungs otherwise clear. Bronchovascular markings normal. No localized airspace consolidation. No pleural effusions. No pneumothorax. Normal pulmonary vascularity. Degenerative changes involving the thoracic and visualized lumbar spine. IMPRESSION: Suboptimal inspiration accounts for mild atelectasis in the LEFT LOWER LOBE. No acute cardiopulmonary disease otherwise. Electronically Signed   By: Evangeline Dakin M.D.   On: 09/22/2018 20:12       Assessment &  Plan:   Problem List Items Addressed This Visit    Abnormal liver function tests    Follow liver function tests.        Anemia    Follow cbc.       Relevant Orders   CBC with Differential/Platelet   Ferritin   CAD (coronary artery disease)    Previous stent RCA.  Followed by cardiology.  Continue imdur, atenolol.  Feels stable.        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 On metformin.  Follow met b and a1c.        Relevant Orders   Hemoglobin A1c   Dysphagia    Being followed by GI as outlined.  Follow.        GERD (gastroesophageal reflux disease)    On protonix.  Still with swallowing issues.  Avoiding meats.  Follow.  Seeing GI.       Health care maintenance    Physical today 05/07/19.  Colonoscopy 02/13/18 - diverticulosis and internal hemorrhoids.  Follow psa.       Hyperlipidemia    On crestor.  Low cholesterol diet and exercise.  Follow lipid panel and liver function tests.        Relevant Orders   Lipid panel   Hepatic function panel   Hypertension    Blood pressure under good control.  Continue same medication regimen - losartan, atenolol and imdur. Follow pressures.  Follow metabolic panel.        Relevant Orders   TSH   Basic metabolic panel   Psoriatic arthritis ([place])    Followed by Dr Jefm Bryant.  Cosentyx.  Follow.         Other Visit Diagnoses    Routine general medical examination at a health care facility    -  Primary       Einar Pheasant, MD

## 2019-05-07 NOTE — Assessment & Plan Note (Addendum)
Physical today 05/07/19.  Colonoscopy 02/13/18 - diverticulosis and internal hemorrhoids.  Follow psa.

## 2019-05-15 ENCOUNTER — Encounter: Payer: Self-pay | Admitting: Internal Medicine

## 2019-05-15 NOTE — Assessment & Plan Note (Signed)
Blood pressure under good control.  Continue same medication regimen - losartan, atenolol and imdur. Follow pressures.  Follow metabolic panel.

## 2019-05-15 NOTE — Assessment & Plan Note (Signed)
Followed by Dr Kernodle.  Cosentyx.  Follow.  

## 2019-05-15 NOTE — Assessment & Plan Note (Signed)
Being followed by GI as outlined.  Follow.

## 2019-05-15 NOTE — Assessment & Plan Note (Signed)
Follow cbc.  

## 2019-05-15 NOTE — Assessment & Plan Note (Signed)
Low carb diet and exercise.  On metformin.  Follow met b and a1c.   

## 2019-05-15 NOTE — Assessment & Plan Note (Signed)
On crestor.  Low cholesterol diet and exercise.  Follow lipid panel and liver function tests.   

## 2019-05-15 NOTE — Assessment & Plan Note (Signed)
On protonix.  Still with swallowing issues.  Avoiding meats.  Follow.  Seeing GI.

## 2019-05-15 NOTE — Assessment & Plan Note (Signed)
Follow liver function tests.   

## 2019-05-15 NOTE — Assessment & Plan Note (Signed)
Previous stent RCA.  Followed by cardiology.  Continue imdur, atenolol.  Feels stable.

## 2019-05-26 ENCOUNTER — Encounter: Payer: Self-pay | Admitting: Internal Medicine

## 2019-06-02 ENCOUNTER — Other Ambulatory Visit: Payer: Self-pay | Admitting: Physician Assistant

## 2019-06-02 DIAGNOSIS — I6523 Occlusion and stenosis of bilateral carotid arteries: Secondary | ICD-10-CM

## 2019-06-04 DIAGNOSIS — K222 Esophageal obstruction: Secondary | ICD-10-CM | POA: Diagnosis not present

## 2019-06-04 DIAGNOSIS — K228 Other specified diseases of esophagus: Secondary | ICD-10-CM | POA: Diagnosis not present

## 2019-06-04 DIAGNOSIS — Z9049 Acquired absence of other specified parts of digestive tract: Secondary | ICD-10-CM | POA: Diagnosis not present

## 2019-06-04 DIAGNOSIS — K449 Diaphragmatic hernia without obstruction or gangrene: Secondary | ICD-10-CM | POA: Diagnosis not present

## 2019-06-04 DIAGNOSIS — Z880 Allergy status to penicillin: Secondary | ICD-10-CM | POA: Diagnosis not present

## 2019-06-04 DIAGNOSIS — E119 Type 2 diabetes mellitus without complications: Secondary | ICD-10-CM | POA: Diagnosis not present

## 2019-06-04 DIAGNOSIS — Z13810 Encounter for screening for upper gastrointestinal disorder: Secondary | ICD-10-CM | POA: Diagnosis not present

## 2019-06-04 DIAGNOSIS — R1310 Dysphagia, unspecified: Secondary | ICD-10-CM | POA: Diagnosis not present

## 2019-07-27 DIAGNOSIS — L4050 Arthropathic psoriasis, unspecified: Secondary | ICD-10-CM | POA: Diagnosis not present

## 2019-07-27 DIAGNOSIS — D171 Benign lipomatous neoplasm of skin and subcutaneous tissue of trunk: Secondary | ICD-10-CM | POA: Diagnosis not present

## 2019-07-27 DIAGNOSIS — M778 Other enthesopathies, not elsewhere classified: Secondary | ICD-10-CM | POA: Diagnosis not present

## 2019-07-27 DIAGNOSIS — M1611 Unilateral primary osteoarthritis, right hip: Secondary | ICD-10-CM | POA: Diagnosis not present

## 2019-07-27 DIAGNOSIS — M25551 Pain in right hip: Secondary | ICD-10-CM | POA: Diagnosis not present

## 2019-07-27 DIAGNOSIS — I878 Other specified disorders of veins: Secondary | ICD-10-CM | POA: Diagnosis not present

## 2019-07-27 DIAGNOSIS — M76891 Other specified enthesopathies of right lower limb, excluding foot: Secondary | ICD-10-CM | POA: Diagnosis not present

## 2019-07-27 DIAGNOSIS — G8929 Other chronic pain: Secondary | ICD-10-CM | POA: Diagnosis not present

## 2019-07-27 DIAGNOSIS — M545 Low back pain: Secondary | ICD-10-CM | POA: Diagnosis not present

## 2019-07-30 ENCOUNTER — Other Ambulatory Visit: Payer: Self-pay

## 2019-07-30 ENCOUNTER — Other Ambulatory Visit (INDEPENDENT_AMBULATORY_CARE_PROVIDER_SITE_OTHER): Payer: Medicare Other

## 2019-07-30 DIAGNOSIS — D649 Anemia, unspecified: Secondary | ICD-10-CM | POA: Diagnosis not present

## 2019-07-30 DIAGNOSIS — N183 Chronic kidney disease, stage 3 unspecified: Secondary | ICD-10-CM | POA: Diagnosis not present

## 2019-07-30 DIAGNOSIS — E785 Hyperlipidemia, unspecified: Secondary | ICD-10-CM

## 2019-07-30 DIAGNOSIS — E0822 Diabetes mellitus due to underlying condition with diabetic chronic kidney disease: Secondary | ICD-10-CM

## 2019-07-30 DIAGNOSIS — I10 Essential (primary) hypertension: Secondary | ICD-10-CM

## 2019-07-30 LAB — CBC WITH DIFFERENTIAL/PLATELET
Basophils Absolute: 0 10*3/uL (ref 0.0–0.1)
Basophils Relative: 0.7 % (ref 0.0–3.0)
Eosinophils Absolute: 0.3 10*3/uL (ref 0.0–0.7)
Eosinophils Relative: 7.5 % — ABNORMAL HIGH (ref 0.0–5.0)
HCT: 36.8 % — ABNORMAL LOW (ref 39.0–52.0)
Hemoglobin: 12.5 g/dL — ABNORMAL LOW (ref 13.0–17.0)
Lymphocytes Relative: 22.4 % (ref 12.0–46.0)
Lymphs Abs: 1 10*3/uL (ref 0.7–4.0)
MCHC: 34 g/dL (ref 30.0–36.0)
MCV: 85.9 fl (ref 78.0–100.0)
Monocytes Absolute: 0.5 10*3/uL (ref 0.1–1.0)
Monocytes Relative: 11.1 % (ref 3.0–12.0)
Neutro Abs: 2.6 10*3/uL (ref 1.4–7.7)
Neutrophils Relative %: 58.3 % (ref 43.0–77.0)
Platelets: 154 10*3/uL (ref 150.0–400.0)
RBC: 4.28 Mil/uL (ref 4.22–5.81)
RDW: 17.8 % — ABNORMAL HIGH (ref 11.5–15.5)
WBC: 4.5 10*3/uL (ref 4.0–10.5)

## 2019-07-30 LAB — HEPATIC FUNCTION PANEL
ALT: 20 U/L (ref 0–53)
AST: 14 U/L (ref 0–37)
Albumin: 4.3 g/dL (ref 3.5–5.2)
Alkaline Phosphatase: 47 U/L (ref 39–117)
Bilirubin, Direct: 0.1 mg/dL (ref 0.0–0.3)
Total Bilirubin: 0.4 mg/dL (ref 0.2–1.2)
Total Protein: 6.8 g/dL (ref 6.0–8.3)

## 2019-07-30 LAB — BASIC METABOLIC PANEL
BUN: 13 mg/dL (ref 6–23)
CO2: 27 mEq/L (ref 19–32)
Calcium: 9.3 mg/dL (ref 8.4–10.5)
Chloride: 105 mEq/L (ref 96–112)
Creatinine, Ser: 0.77 mg/dL (ref 0.40–1.50)
GFR: 99.64 mL/min (ref >60.00)
Glucose, Bld: 137 mg/dL — ABNORMAL HIGH (ref 70–99)
Potassium: 3.8 mEq/L (ref 3.5–5.1)
Sodium: 140 mEq/L (ref 135–145)

## 2019-07-30 LAB — LIPID PANEL
Cholesterol: 124 mg/dL (ref 0–200)
HDL: 36.9 mg/dL — ABNORMAL LOW (ref >39.00)
LDL Cholesterol: 68 mg/dL (ref 0–99)
NonHDL: 87.41
Total CHOL/HDL Ratio: 3
Triglycerides: 97 mg/dL (ref 0.0–149.0)
VLDL: 19.4 mg/dL (ref 0.0–40.0)

## 2019-07-30 LAB — TSH: TSH: 2.05 u[IU]/mL (ref 0.35–4.50)

## 2019-07-30 LAB — FERRITIN: Ferritin: 10.1 ng/mL — ABNORMAL LOW (ref 22.0–322.0)

## 2019-07-30 LAB — HEMOGLOBIN A1C: Hgb A1c MFr Bld: 6.7 % — ABNORMAL HIGH (ref 4.6–6.5)

## 2019-08-06 ENCOUNTER — Encounter: Payer: Self-pay | Admitting: Internal Medicine

## 2019-08-06 ENCOUNTER — Ambulatory Visit (INDEPENDENT_AMBULATORY_CARE_PROVIDER_SITE_OTHER): Payer: Medicare Other | Admitting: Internal Medicine

## 2019-08-06 ENCOUNTER — Other Ambulatory Visit: Payer: Self-pay

## 2019-08-06 DIAGNOSIS — R7989 Other specified abnormal findings of blood chemistry: Secondary | ICD-10-CM

## 2019-08-06 DIAGNOSIS — K219 Gastro-esophageal reflux disease without esophagitis: Secondary | ICD-10-CM | POA: Diagnosis not present

## 2019-08-06 DIAGNOSIS — D649 Anemia, unspecified: Secondary | ICD-10-CM

## 2019-08-06 DIAGNOSIS — I10 Essential (primary) hypertension: Secondary | ICD-10-CM

## 2019-08-06 DIAGNOSIS — E0822 Diabetes mellitus due to underlying condition with diabetic chronic kidney disease: Secondary | ICD-10-CM

## 2019-08-06 DIAGNOSIS — I25118 Atherosclerotic heart disease of native coronary artery with other forms of angina pectoris: Secondary | ICD-10-CM

## 2019-08-06 DIAGNOSIS — E785 Hyperlipidemia, unspecified: Secondary | ICD-10-CM | POA: Diagnosis not present

## 2019-08-06 DIAGNOSIS — R945 Abnormal results of liver function studies: Secondary | ICD-10-CM

## 2019-08-06 DIAGNOSIS — L4050 Arthropathic psoriasis, unspecified: Secondary | ICD-10-CM

## 2019-08-06 DIAGNOSIS — D171 Benign lipomatous neoplasm of skin and subcutaneous tissue of trunk: Secondary | ICD-10-CM | POA: Diagnosis not present

## 2019-08-06 DIAGNOSIS — N183 Chronic kidney disease, stage 3 unspecified: Secondary | ICD-10-CM

## 2019-08-06 NOTE — Progress Notes (Signed)
Patient ID: Shane Mcknight, male   DOB: 11-13-1950, 69 y.o.   MRN: [id number]   Subjective:    Patient ID: Shane Mcknight, male    DOB: 11-01-1950, 69 y.o.   MRN: [id number]  HPI This visit occurred during the SARS-CoV-2 public health emergency.  Safety protocols were in place, including screening questions prior to the visit, additional usage of staff PPE, and extensive cleaning of exam room while observing appropriate contact time as indicated for disinfecting solutions.  Patient here for a scheduled follow up.  He reports he is doing relatively well.  Still with swallowing issues.  Had recent EGD.  Planning for f/u EGD end of August.  States planning for series of EGD with dilatation to see if symptoms improve.  Affecting his eating.  Eating vegetables.  No red meat. No tomatoes.  No abdominal pain.  Bowels moving.  Also planning for removal of lipoma - Dr Peyton Najjar (two weeks).  No chest pain or sob.  Right hip bothers him.  Sees Dr Fletcher Anon - for f/u CAD.  Doing well.   Past Medical History:  Diagnosis Date  . Abnormal liver function tests   . Allergic state   . Anemia   . Arthritis    spine, hands, shoulder with previous cuff tear  . CAD (coronary artery disease)    a.  11/2015 CT Chest/Abd: Coronary Ca2+ noted;  b. MV: EF 45-54%, med defect of mod severity in basal infsept, basal inf, mid infsept, and mid inf region-> infarct and peri-infarct ischemia-->intermediate risk; c. 05/2016 Cath/PCI: LM 40ost, LAD 30p, 91m D1 60ost, LCX 60ost, OM1 99 (small->Med Rx), RCA 60p, 95d (2.5x15 Resolute Onyx DES), RPDA/RPLB min irregs, D2 60, EF 55-65%/  . Candidiasis of esophagus ([place]   . Colon polyp   . DDD (degenerative disc disease), lumbosacral   . Dysphagia   . Elevated transaminase level   . Esophageal stricture    a. s/p multiple dilations - last 03/2016.  .Marland KitchenEsophagitis, [place] grade D   . Ganglion cyst of finger of right hand   . GERD (gastroesophageal reflux disease)   . Guaiac positive  stools   . Hiatal hernia   . HNP (herniated nucleus pulposus), lumbar   . Hypercholesterolemia   . Hypertension   . Lumbar radiculitis   . Mallory-Weiss tear    a. 11/2015.  . Migraine aura without headache    migraine, visual  . Psoriasis    with psoriatic arthritis  . Psoriatic arthritis (HCC)    severe  . Schatzki's ring   . Sleep apnea    does not uses CPAP. has lost weight so is much better.  . Type II diabetes mellitus ([place]    Past Surgical History:  Procedure Laterality Date  . ARTHROPLASTY     right thumb; left thumb with 2 screws and plastic joint  . BALLOON DILATION N/A 05/29/2015   Procedure: BALLOON DILATION;  Surgeon: MLollie Sails MD;  [facility]  Service: Endoscopy;  Laterality: N/A;  . BALLOON DILATION N/A 02/14/2017   Procedure: BALLOON DILATION;  Surgeon: SLollie Sails MD;  [facility]  Service: Endoscopy;  Laterality: N/A;  . BILATERAL CARPAL TUNNEL RELEASE Bilateral 07/04/2017   Procedure: BILATERAL CARPAL TUNNEL RELEASE;  Surgeon: KLeanor Kail MD;  Location: [facility] ORS;  Service: Orthopedics;  Laterality: Bilateral;  . BLEPHAROPLASTY    . CARDIAC CATHETERIZATION     1 stent   . CARPAL TUNNEL RELEASE  2005  right  . CHOLECYSTECTOMY  2008  . COLONOSCOPY    . COLONOSCOPY WITH PROPOFOL N/A 02/13/2018   Procedure: COLONOSCOPY WITH PROPOFOL;  Surgeon: Lollie Sails, MD;  Location: [facility]COPY;  Service: Endoscopy;  Laterality: N/A;  . CORONARY STENT INTERVENTION N/A 06/14/2016   Procedure: Coronary Stent Intervention;  Surgeon: Wellington Hampshire, MD;  Location: [place] [facility] LAB;  Service: Cardiovascular;  Laterality: N/A;  . CYST EXCISION  04/14/2015   tendon sheath cyst excision; right ring finger cyst removed and trigger finger realease  . ESOPHAGOGASTRODUODENOSCOPY    . ESOPHAGOGASTRODUODENOSCOPY (EGD) WITH PROPOFOL N/A 05/29/2015   Procedure: ESOPHAGOGASTRODUODENOSCOPY (EGD) WITH PROPOFOL;  Surgeon: Lollie Sails, MD;  Location: [facility] ENDOSCOPY;  Service: Endoscopy;  Laterality: N/A;  . ESOPHAGOGASTRODUODENOSCOPY (EGD) WITH PROPOFOL N/A 07/04/2015   Procedure: ESOPHAGOGASTRODUODENOSCOPY (EGD) WITH PROPOFOL;  Surgeon: Lollie Sails, MD;  Location: [facility] ENDOSCOPY;  Service: Endoscopy;  Laterality: N/A;  . ESOPHAGOGASTRODUODENOSCOPY (EGD) WITH PROPOFOL N/A 01/04/2016   Procedure: ESOPHAGOGASTRODUODENOSCOPY (EGD) WITH PROPOFOL;  Surgeon: Lollie Sails, MD;  Location[facility] ENDOSCOPY;  Service: Endoscopy;  Laterality: N/A;  . ESOPHAGOGASTRODUODENOSCOPY (EGD) WITH PROPOFOL N/A 03/22/2016   Procedure: ESOPHAGOGASTRODUODENOSCOPY (EGD) WITH PROPOFOL;  Surgeon: Lollie Sails, MD;  Location: [facility] ENDOSCOPY;  Service: Endoscopy;  Laterality: N/A;  . ESOPHAGOGASTRODUODENOSCOPY (EGD) WITH PROPOFOL N/A 02/14/2017   Procedure: ESOPHAGOGASTRODUODENOSCOPY (EGD) WITH PROPOFOL;  Surgeon: Lollie Sails, MD;  Location: [facility] ENDOSCOPY;  Service: Endoscopy;  Laterality: N/A;  . ESOPHAGOGASTRODUODENOSCOPY (EGD) WITH PROPOFOL N/A 04/11/2017   Procedure: ESOPHAGOGASTRODUODENOSCOPY (EGD) WITH PROPOFOL;  Surgeon: Lollie Sails, MD;  Location: [facility] ENDOSCOPY;  Service: Endoscopy;  Laterality: N/A;  . ESOPHAGOGASTRODUODENOSCOPY (EGD) WITH PROPOFOL N/A 07/07/2017   Procedure: ESOPHAGOGASTRODUODENOSCOPY (EGD) WITH PROPOFOL;  Surgeon: Lucilla Lame, MD;  Location: [facility] ENDOSCOPY;  Service: Endoscopy;  Laterality: N/A;  . ESOPHAGOGASTRODUODENOSCOPY (EGD) WITH PROPOFOL N/A 02/13/2018   Procedure: ESOPHAGOGASTRODUODENOSCOPY (EGD) WITH PROPOFOL;  Surgeon: Lollie Sails, MD;  Location: [facility] ENDOSCOPY;  Service: Endoscopy;  Laterality: N/A;  . EYE SURGERY    . FOREIGN BODY REMOVAL N/A 12/03/2015   Procedure: FOREIGN BODY REMOVAL;  Surgeon: Wilford Corner, MD;  Location[facility] END[facility]COPY;  Service: Endoscopy;  Laterality: N/A;  . HERNIA REPAIR Right 1970   inguinal  . INGUINAL HERNIA REPAIR Left 01/01/2018    Procedure: OPEN HERNIA REPAIR INGUINAL ADULT;  Surgeon: Olean Ree, MD;  Location: [facility] ORS;  Service: General;  Laterality: Left;  . LEFT HEART CATH Left 06/14/2016   Procedure: Left Heart Cath;  Surgeon: Wellington Hampshire, MD;  Location: [place] CV LAB;  Service: Cardiovascular;  Laterality: Left;  . LEFT HEART CATH AND CORONARY ANGIOGRAPHY Left 09/14/2018   Procedure: LEFT HEART CATH AND CORONARY ANGIOGRAPHY;  Surgeon: Wellington Hampshire, MD;  Location: [place] CV LAB;  Service: Cardiovascular;  Laterality: Left;  . LUMBAR DISC SURGERY  2004   injections; herniated disc; percutaneous discectomy  . NASAL SINUS SURGERY  2008  . percutaneous lumbar discectomy    . ROTATOR CUFF REPAIR Right   . TONSILLECTOMY     Family History  Problem Relation Age of Onset  . Lung cancer Father   . Cancer Father   . Hypertension Mother   . Aneurysm Son    Social History   Socioeconomic History  . Marital status: Married    Spouse name: Pamala Hurry  . Number of children: Not on file  . Years of education: Not on file  . Highest education level: Not on  file  Occupational History    Comment: not heavy work  Tobacco Use  . Smoking status: Former Smoker    Types: Cigarettes    Quit date: 07/06/1982    Years since quitting: 37.1  . Smokeless tobacco: Never Used  Vaping Use  . Vaping Use: Never used  Substance and Sexual Activity  . Alcohol use: Yes    Alcohol/week: 0.0 standard drinks  . Drug use: No  . Sexual activity: Not on file  Other Topics Concern  . Not on file  Social History Narrative   Lives locally with wife.  Active but does not routinely exercise.   Social Determinants of Health   Financial Resource Strain:   . Difficulty of Paying Living Expenses:   Food Insecurity:   . Worried About Charity fundraiser in the Last Year:   . Arboriculturist in the Last Year:   Transportation Needs:   . Film/video editor (Medical):   Marland Kitchen Lack of Transportation (Non-Medical):     Physical Activity:   . Days of Exercise per Week:   . Minutes of Exercise per Session:   Stress:   . Feeling of Stress :   Social Connections:   . Frequency of Communication with Friends and Family:   . Frequency of Social Gatherings with Friends and Family:   . Attends Religious Services:   . Active Member of Clubs or Organizations:   . Attends Archivist Meetings:   Marland Kitchen Marital Status:     Outpatient Encounter Medications as of 08/06/2019  Medication Sig  . acetaminophen (TYLENOL) 500 MG tablet Take 1,000 mg by mouth daily as needed for moderate pain.  Marland Kitchen aspirin EC 81 MG tablet Take 81 mg by mouth every evening.   Marland Kitchen atenolol (TENORMIN) 50 MG tablet TAKE 1 TABLET BY MOUTH  TWICE DAILY  . COSENTYX SENSOREADY PEN 150 MG/ML SOAJ Inject 300 mg into the skin every 30 (thirty) days.   . cyanocobalamin (,VITAMIN B-12,) 1000 MCG/ML injection Inject 1 mL (1,000 mcg total) into the muscle every 30 (thirty) days.  . folic acid (FOLVITE) 1 MG tablet Take 1 mg by mouth every evening.   . gabapentin (NEURONTIN) 300 MG capsule TAKE 1 TO 2 CAPSULES BY  MOUTH AT BEDTIME  . isosorbide mononitrate (IMDUR) 30 MG 24 hr tablet TAKE 1 TABLET BY MOUTH  DAILY  . losartan (COZAAR) 25 MG tablet Take 0.5 tablets (12.5 mg total) by mouth daily.  . metFORMIN (GLUCOPHAGE) 500 MG tablet TAKE 2 TABLETS BY MOUTH  TWICE DAILY WITH A MEAL  . methotrexate (RHEUMATREX) 2.5 MG tablet Take 8 tablets (20 mg total) by mouth once a week. Take 8 tablets every Friday.  . Multiple Vitamin (MULTIVITAMIN) tablet Take 1 tablet by mouth daily.  . nitroGLYCERIN (NITROSTAT) 0.4 MG SL tablet Place 1 tablet (0.4 mg total) under the tongue every 5 (five) minutes as needed. MAXIMUM OF 3 DOSES.  Marland Kitchen pantoprazole (PROTONIX) 40 MG tablet TAKE 1 TABLET BY MOUTH  TWICE DAILY  . rosuvastatin (CRESTOR) 10 MG tablet Take 1 tablet (10 mg total) by mouth daily.  . SYRINGE-NEEDLE, DISP, 3 ML 25G X 5/8" 3 ML MISC Use as instructed with B12  injection.  . triamcinolone cream (KENALOG) 0.5 % Apply 1 application topically 2 (two) times daily as needed (scaling).  . vitamin E 400 UNIT capsule Take 400 Units by mouth every evening.   No facility-administered encounter medications on file as of 08/06/2019.  Review of Systems  Constitutional: Negative for appetite change and unexpected weight change.  HENT: Negative for congestion and sinus pressure.   Respiratory: Negative for cough, chest tightness and shortness of breath.   Cardiovascular: Negative for chest pain, palpitations and leg swelling.  Gastrointestinal: Negative for abdominal pain, diarrhea and nausea.       Dysphagia as outlined.    Genitourinary: Negative for difficulty urinating and dysuria.  Musculoskeletal: Negative for myalgias.       Right hip pain as outlined.    Skin: Negative for color change and rash.  Neurological: Negative for dizziness, light-headedness and headaches.  Psychiatric/Behavioral: Negative for agitation and dysphoric mood.       Objective:    Physical Exam Vitals reviewed.  Constitutional:      General: He is not in acute distress.    Appearance: Normal appearance. He is well-developed.  HENT:     Head: Normocephalic and atraumatic.     Right Ear: External ear normal.     Left Ear: External ear normal.  Eyes:     General: No scleral icterus.       Right eye: No discharge.        Left eye: No discharge.     Conjunctiva/sclera: Conjunctivae normal.  Cardiovascular:     Rate and Rhythm: Normal rate and regular rhythm.  Pulmonary:     Effort: Pulmonary effort is normal. No respiratory distress.     Breath sounds: Normal breath sounds.  Abdominal:     General: Bowel sounds are normal.     Palpations: Abdomen is soft.     Tenderness: There is no abdominal tenderness.  Musculoskeletal:        General: No swelling or tenderness.     Cervical back: Neck supple. No tenderness.  Lymphadenopathy:     Cervical: No cervical  adenopathy.  Skin:    Findings: No erythema or rash.  Neurological:     Mental Status: He is alert.  Psychiatric:        Mood and Affect: Mood normal.        Behavior: Behavior normal.     BP 112/78   Pulse 66   Temp 98.3 F (36.8 C)   Resp 16   Ht _0  (1.753 m)   Wt 186 lb 3.2 oz (84.5 kg)   SpO2 98%   BMI 27.50 kg/m  Wt Readings from Last 3 Encounters:  08/06/19 186 lb 3.2 oz (84.5 kg)  05/07/19 183 lb (83 kg)  02/12/19 187 lb (84.8 kg)     Lab Results  Component Value Date   WBC 4.5 07/30/2019   HGB 12.5 (L) 07/30/2019   HCT 36.8 (L) 07/30/2019   PLT 154.0 07/30/2019   GLUCOSE 137 (H) 07/30/2019   CHOL 124 07/30/2019   TRIG 97.0 07/30/2019   HDL 36.90 (L) 07/30/2019   LDLCALC 68 07/30/2019   ALT 20 07/30/2019   AST 14 07/30/2019   NA 140 07/30/2019   K 3.8 07/30/2019   CL 105 07/30/2019   CREATININE 0.77 07/30/2019   BUN 13 07/30/2019   CO2 27 07/30/2019   TSH 2.05 07/30/2019   PSA 1.82 02/26/2019   INR 1.1 06/07/2016   HGBA1C 6.7 (H) 07/30/2019   MICROALBUR 13.6 (H) 02/27/2018    DG Chest 2 View  Result Date: 09/22/2018 CLINICAL DATA:  Approximate one-week history of fever and chest tightness. EXAM: CHEST - 2 VIEW COMPARISON:  06/27/2017 and earlier. FINDINGS: Cardiomediastinal silhouette unremarkable and unchanged.  Suboptimal inspiration which accounts for mild atelectasis in the LEFT LOWER LOBE. Lungs otherwise clear. Bronchovascular markings normal. No localized airspace consolidation. No pleural effusions. No pneumothorax. Normal pulmonary vascularity. Degenerative changes involving the thoracic and visualized lumbar spine. IMPRESSION: Suboptimal inspiration accounts for mild atelectasis in the LEFT LOWER LOBE. No acute cardiopulmonary disease otherwise. Electronically Signed   By: Evangeline Dakin M.D.   On: 09/22/2018 20:12       Assessment & Plan:   Problem List Items Addressed This Visit    Abnormal liver function tests    Follow liver  function tests.       Anemia    Follow cbc.       Relevant Orders   CBC with Differential/Platelet   Vitamin B12   IBC + Ferritin   CAD (coronary artery disease)    S/p stent RCA.  Continue risk factor modification.  Followed by cardiology.  Planning for removal lipoma in two weeks.  Low numbing with procedure in office.  Planning for EGD - end of August.  Just had EGD - no complications.  No chest pain or sob.        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  Follow met b and a1c.   Lab Results  Component Value Date   HGBA1C 6.7 (H) 07/30/2019        Relevant Orders   Hemoglobin A1c   Microalbumin / creatinine urine ratio   GERD (gastroesophageal reflux disease)    Continues on protonix.  Still with swallowing issues as outlined.  Seeing GI.  Planning for EGD end of August.  Just had EGD. Did well.  No complications. Denies chest pain or sob.  Plans to discuss upcoming procedure with cardiology.        Hyperlipidemia    On crestor.  Low cholesterol diet and exercise.  Follow lipid panel and liver function tests.        Relevant Orders   Hepatic function panel   Lipid panel   Hypertension    Blood pressure as outlined. Continue atenolol, isosorbide and losartan.  Follow pressures.  Follow metabolic panel.         Relevant Orders   Basic metabolic panel   Psoriatic arthritis ([place])    Followed by Dr Jefm Bryant.  Cosentyx.  Follow.           Einar Pheasant, MD

## 2019-08-07 ENCOUNTER — Encounter: Payer: Self-pay | Admitting: Internal Medicine

## 2019-08-07 NOTE — Assessment & Plan Note (Signed)
Follow liver function tests.   

## 2019-08-07 NOTE — Assessment & Plan Note (Signed)
Continues on protonix.  Still with swallowing issues as outlined.  Seeing GI.  Planning for EGD end of August.  Just had EGD. Did well.  No complications. Denies chest pain or sob.  Plans to discuss upcoming procedure with cardiology.

## 2019-08-07 NOTE — Assessment & Plan Note (Addendum)
Follow cbc. Being followed by GI.

## 2019-08-07 NOTE — Assessment & Plan Note (Signed)
Blood pressure as outlined. Continue atenolol, isosorbide and losartan.  Follow pressures.  Follow metabolic panel.

## 2019-08-07 NOTE — Assessment & Plan Note (Signed)
S/p stent RCA.  Continue risk factor modification.  Followed by cardiology.  Planning for removal lipoma in two weeks.  Low numbing with procedure in office.  Planning for EGD - end of August.  Just had EGD - no complications.  No chest pain or sob.

## 2019-08-07 NOTE — Assessment & Plan Note (Signed)
Low carb diet and exercise.  Follow met b and a1c.   Lab Results  Component Value Date   HGBA1C 6.7 (H) 07/30/2019   

## 2019-08-07 NOTE — Assessment & Plan Note (Signed)
Followed by Dr Jefm Bryant.  Cosentyx.  Follow.

## 2019-08-07 NOTE — Assessment & Plan Note (Signed)
On crestor.  Low cholesterol diet and exercise.  Follow lipid panel and liver function tests.   

## 2019-08-20 DIAGNOSIS — D171 Benign lipomatous neoplasm of skin and subcutaneous tissue of trunk: Secondary | ICD-10-CM | POA: Diagnosis not present

## 2019-08-21 ENCOUNTER — Other Ambulatory Visit: Payer: Self-pay | Admitting: Cardiovascular Disease

## 2019-08-27 DIAGNOSIS — M5416 Radiculopathy, lumbar region: Secondary | ICD-10-CM | POA: Diagnosis not present

## 2019-08-27 DIAGNOSIS — M5136 Other intervertebral disc degeneration, lumbar region: Secondary | ICD-10-CM | POA: Diagnosis not present

## 2019-09-03 DIAGNOSIS — K222 Esophageal obstruction: Secondary | ICD-10-CM | POA: Diagnosis not present

## 2019-09-03 DIAGNOSIS — K228 Other specified diseases of esophagus: Secondary | ICD-10-CM | POA: Diagnosis not present

## 2019-09-03 DIAGNOSIS — K449 Diaphragmatic hernia without obstruction or gangrene: Secondary | ICD-10-CM | POA: Diagnosis not present

## 2019-09-24 DIAGNOSIS — M5416 Radiculopathy, lumbar region: Secondary | ICD-10-CM | POA: Diagnosis not present

## 2019-09-24 DIAGNOSIS — M5126 Other intervertebral disc displacement, lumbar region: Secondary | ICD-10-CM | POA: Diagnosis not present

## 2019-10-01 DIAGNOSIS — L7633 Postprocedural seroma of skin and subcutaneous tissue following a dermatologic procedure: Secondary | ICD-10-CM | POA: Diagnosis not present

## 2019-10-15 DIAGNOSIS — M5126 Other intervertebral disc displacement, lumbar region: Secondary | ICD-10-CM | POA: Diagnosis not present

## 2019-10-15 DIAGNOSIS — M48062 Spinal stenosis, lumbar region with neurogenic claudication: Secondary | ICD-10-CM | POA: Diagnosis not present

## 2019-10-15 DIAGNOSIS — M5416 Radiculopathy, lumbar region: Secondary | ICD-10-CM | POA: Diagnosis not present

## 2019-11-05 ENCOUNTER — Other Ambulatory Visit: Payer: Self-pay | Admitting: Family Medicine

## 2019-11-05 DIAGNOSIS — M5441 Lumbago with sciatica, right side: Secondary | ICD-10-CM

## 2019-11-05 DIAGNOSIS — M5416 Radiculopathy, lumbar region: Secondary | ICD-10-CM | POA: Diagnosis not present

## 2019-11-05 DIAGNOSIS — M5136 Other intervertebral disc degeneration, lumbar region: Secondary | ICD-10-CM | POA: Diagnosis not present

## 2019-11-05 DIAGNOSIS — G8929 Other chronic pain: Secondary | ICD-10-CM | POA: Diagnosis not present

## 2019-11-12 DIAGNOSIS — K2289 Other specified disease of esophagus: Secondary | ICD-10-CM | POA: Diagnosis not present

## 2019-11-12 DIAGNOSIS — E119 Type 2 diabetes mellitus without complications: Secondary | ICD-10-CM | POA: Diagnosis not present

## 2019-11-12 DIAGNOSIS — K222 Esophageal obstruction: Secondary | ICD-10-CM | POA: Diagnosis not present

## 2019-11-12 DIAGNOSIS — K209 Esophagitis, unspecified without bleeding: Secondary | ICD-10-CM | POA: Diagnosis not present

## 2019-11-12 DIAGNOSIS — K449 Diaphragmatic hernia without obstruction or gangrene: Secondary | ICD-10-CM | POA: Diagnosis not present

## 2019-11-19 ENCOUNTER — Other Ambulatory Visit: Payer: Self-pay

## 2019-11-19 ENCOUNTER — Ambulatory Visit: Payer: Medicare Other | Admitting: Cardiovascular Disease

## 2019-11-19 ENCOUNTER — Encounter: Payer: Self-pay | Admitting: Cardiovascular Disease

## 2019-11-19 VITALS — BP 160/82 | HR 56 | Ht 68.5 in | Wt 187.0 lb

## 2019-11-19 DIAGNOSIS — I25118 Atherosclerotic heart disease of native coronary artery with other forms of angina pectoris: Secondary | ICD-10-CM | POA: Diagnosis not present

## 2019-11-19 DIAGNOSIS — I10 Essential (primary) hypertension: Secondary | ICD-10-CM

## 2019-11-19 DIAGNOSIS — I779 Disorder of arteries and arterioles, unspecified: Secondary | ICD-10-CM

## 2019-11-19 DIAGNOSIS — E785 Hyperlipidemia, unspecified: Secondary | ICD-10-CM

## 2019-11-19 NOTE — Progress Notes (Signed)
Cardiology Office Note   Date:  11/19/2019   ID:  Shane Mcknight, DOB [birth year], MRN [id number]  PCP:  Einar Pheasant, MD  Cardiologist:   Kathlyn Sacramento, MD   Chief Complaint  Patient presents with  . other    12 month follow up. Meds reviewed by the pt. verbally. "doing well."       History of Present Illness: Shane Mcknight is a 69 y.o. male who presents for a follow-up visit regarding coronary artery disease. He has known history of hypertension, hyperlipidemia, type 2 diabetes, GERD and Mallory-Weiss tear.  The patient had anginal symptoms in 2018. Nuclear stress test showed inferior Benally ischemia.  Cardiac catheterization at that time showed significant two-vessel coronary artery disease with severe stenosis in the distal right coronary artery. There was subtotal occlusion of OM1 which was small vessel less than 2 mm, 40% ostial left main stenosis and moderate LAD disease. Ejection fraction was normal. I performed successful angioplasty and drug-eluting stent placement to the right coronary artery.  He had recurrent anginal symptoms in August of 2020.  Repeat cardiac catheterization showed patent distal RCA stent with no significant restenosis.  There was stable moderate mid LAD stenosis and proximal RCA stenosis.  OM1 was noted to be completely occluded.  In addition, there was moderate distal RCA disease just proximal to the previously placed stent.  Ejection fraction was normal with mildly elevated left ventricular end-diastolic pressure.  Imdur was added with subsequent improvement in anginal symptoms.  He had issues with hypotension that required decreasing the dose of losartan.   He has been doing well overall but has been suffering from back discomfort requiring steroid injections.  About 2 months ago, he had some chest discomfort that required nitroglycerin but none since then.  Overall, he has no exertional symptoms.   Past Medical History:  Diagnosis Date  . Abnormal  liver function tests   . Allergic state   . Anemia   . Arthritis    spine, hands, shoulder with previous cuff tear  . CAD (coronary artery disease)    a.  11/2015 CT Chest/Abd: Coronary Ca2+ noted;  b. MV: EF 45-54%, med defect of mod severity in basal infsept, basal inf, mid infsept, and mid inf region-> infarct and peri-infarct ischemia-->intermediate risk; c. 05/2016 Cath/PCI: LM 40ost, LAD 30p, 70m, D1 60ost, LCX 60ost, OM1 99 (small->Med Rx), RCA 60p, 95d (2.5x15 Resolute Onyx DES), RPDA/RPLB min irregs, D2 60, EF 55-65%/  . Candidiasis of esophagus ([employer])   . Colon polyp   . DDD (degenerative disc disease), lumbosacral   . Dysphagia   . Elevated transaminase level   . Esophageal stricture    a. s/p multiple dilations - last 03/2016.  Marland Kitchen Esophagitis, [place] grade D   . Ganglion cyst of finger of right hand   . GERD (gastroesophageal reflux disease)   . Guaiac positive stools   . Hiatal hernia   . HNP (herniated nucleus pulposus), lumbar   . Hypercholesterolemia   . Hypertension   . Lumbar radiculitis   . Mallory-Weiss tear    a. 11/2015.  . Migraine aura without headache    migraine, visual  . Psoriasis    with psoriatic arthritis  . Psoriatic arthritis (HCC)    severe  . Schatzki's ring   . Sleep apnea    does not uses CPAP. has lost weight so is much better.  . Type II diabetes mellitus [facility])     Past Surgical  History:  Procedure Laterality Date  . ARTHROPLASTY     right thumb; left thumb with 2 screws and plastic joint  . BALLOON DILATION N/A 05/29/2015   Procedure: BALLOON DILATION;  Surgeon: Lollie Sails, MD;  Location: [facility] ENDOSCOPY;  Service: Endoscopy;  Laterality: N/A;  . BALLOON DILATION N/A 02/14/2017   Procedure: BALLOON DILATION;  Surgeon: Lollie Sails, MD;  Location: [facility] ENDOSCOPY;  Service: Endoscopy;  Laterality: N/A;  . BILATERAL CARPAL TUNNEL RELEASE Bilateral 07/04/2017   Procedure: BILATERAL CARPAL TUNNEL RELEASE;  Surgeon: Leanor Kail, MD;  Location: [facility] ORS;  Service: Orthopedics;  Laterality: Bilateral;  . BLEPHAROPLASTY    . CARDIAC CATHETERIZATION     1 stent   . CARPAL TUNNEL RELEASE  2005   right  . CHOLECYSTECTOMY  2008  . COLONOSCOPY    . COLONOSCOPY WITH PROPOFOL N/A 02/13/2018   Procedure: COLONOSCOPY WITH PROPOFOL;  Surgeon: Lollie Sails, MD;  Location: [facility] ENDOSCOPY;  Service: Endoscopy;  Laterality: N/A;  . CORONARY STENT INTERVENTION N/A 06/14/2016   Procedure: Coronary Stent Intervention;  Surgeon: Wellington Hampshire, MD;  Location: [place] CV LAB;  Service: Cardiovascular;  Laterality: N/A;  . CYST EXCISION  04/14/2015   tendon sheath cyst excision; right ring finger cyst removed and trigger finger realease  . ESOPHAGOGASTRODUODENOSCOPY    . ESOPHAGOGASTRODUODENOSCOPY (EGD) WITH PROPOFOL N/A 05/29/2015   Procedure: ESOPHAGOGASTRODUODENOSCOPY (EGD) WITH PROPOFOL;  Surgeon: Lollie Sails, MD;  Location: [facility] ENDOSCOPY;  Service: Endoscopy;  Laterality: N/A;  . ESOPHAGOGASTRODUODENOSCOPY (EGD) WITH PROPOFOL N/A 07/04/2015   Procedure: ESOPHAGOGASTRODUODENOSCOPY (EGD) WITH PROPOFOL;  Surgeon: Lollie Sails, MD;  Location: [facility] ENDOSCOPY;  Service: Endoscopy;  Laterality: N/A;  . ESOPHAGOGASTRODUODENOSCOPY (EGD) WITH PROPOFOL N/A 01/04/2016   Procedure: ESOPHAGOGASTRODUODENOSCOPY (EGD) WITH PROPOFOL;  Surgeon: Lollie Sails, MD;  Location: [facility] ENDOSCOPY;  Service: Endoscopy;  Laterality: N/A;  . ESOPHAGOGASTRODUODENOSCOPY (EGD) WITH PROPOFOL N/A 03/22/2016   Procedure: ESOPHAGOGASTRODUODENOSCOPY (EGD) WITH PROPOFOL;  Surgeon: Lollie Sails, MD;  Location: [facility] ENDOSCOPY;  Service: Endoscopy;  Laterality: N/A;  . ESOPHAGOGASTRODUODENOSCOPY (EGD) WITH PROPOFOL N/A 02/14/2017   Procedure: ESOPHAGOGASTRODUODENOSCOPY (EGD) WITH PROPOFOL;  Surgeon: Lollie Sails, MD;  Location: [facility] ENDOSCOPY;  Service: Endoscopy;  Laterality: N/A;  . ESOPHAGOGASTRODUODENOSCOPY (EGD) WITH PROPOFOL N/A  04/11/2017   Procedure: ESOPHAGOGASTRODUODENOSCOPY (EGD) WITH PROPOFOL;  Surgeon: Lollie Sails, MD;  Location: [facility] ENDOSCOPY;  Service: Endoscopy;  Laterality: N/A;  . ESOPHAGOGASTRODUODENOSCOPY (EGD) WITH PROPOFOL N/A 07/07/2017   Procedure: ESOPHAGOGASTRODUODENOSCOPY (EGD) WITH PROPOFOL;  Surgeon: Lucilla Lame, MD;  Location: [facility] ENDOSCOPY;  Service: Endoscopy;  Laterality: N/A;  . ESOPHAGOGASTRODUODENOSCOPY (EGD) WITH PROPOFOL N/A 02/13/2018   Procedure: ESOPHAGOGASTRODUODENOSCOPY (EGD) WITH PROPOFOL;  Surgeon: Lollie Sails, MD;  Location: [facility] ENDOSCOPY;  Service: Endoscopy;  Laterality: N/A;  . EYE SURGERY    . FOREIGN BODY REMOVAL N/A 12/03/2015   Procedure: FOREIGN BODY REMOVAL;  Surgeon: Wilford Corner, MD;  Location: [facility] ENDOSCOPY;  Service: Endoscopy;  Laterality: N/A;  . HERNIA REPAIR Right 1970   inguinal  . INGUINAL HERNIA REPAIR Left 01/01/2018   Procedure: OPEN HERNIA REPAIR INGUINAL ADULT;  Surgeon: Olean Ree, MD;  Location: [facility] ORS;  Service: General;  Laterality: Left;  . LEFT HEART CATH Left 06/14/2016   Procedure: Left Heart Cath;  Surgeon: Wellington Hampshire, MD;  Location: [place] CV LAB;  Service: Cardiovascular;  Laterality: Left;  . LEFT HEART CATH AND CORONARY ANGIOGRAPHY Left 09/14/2018   Procedure: LEFT HEART CATH AND CORONARY ANGIOGRAPHY;  Surgeon: Wellington Hampshire, MD;  Location: [place] CV LAB;  Service: Cardiovascular;  Laterality: Left;  . LUMBAR DISC SURGERY  2004   injections; herniated disc; percutaneous discectomy  . NASAL SINUS SURGERY  2008  . percutaneous lumbar discectomy    . ROTATOR CUFF REPAIR Right   . TONSILLECTOMY       Current Outpatient Medications  Medication Sig Dispense Refill  . acetaminophen (TYLENOL) 500 MG tablet Take 1,000 mg by mouth daily as needed for moderate pain.    Marland Kitchen aspirin EC 81 MG tablet Take 81 mg by mouth every evening.     Marland Kitchen atenolol (TENORMIN) 50 MG tablet TAKE 1 TABLET BY MOUTH  TWICE  DAILY 180 tablet 3  . COSENTYX SENSOREADY PEN 150 MG/ML SOAJ Inject 300 mg into the skin every 30 (thirty) days.   10  . cyanocobalamin (,VITAMIN B-12,) 1000 MCG/ML injection Inject 1 mL (1,000 mcg total) into the muscle every 30 (thirty) days. 10 mL 1  . folic acid (FOLVITE) 1 MG tablet Take 1 mg by mouth every evening.     . gabapentin (NEURONTIN) 300 MG capsule TAKE 1 TO 2 CAPSULES BY  MOUTH AT BEDTIME 180 capsule 3  . isosorbide mononitrate (IMDUR) 30 MG 24 hr tablet TAKE 1 TABLET BY MOUTH  DAILY 90 tablet 0  . losartan (COZAAR) 25 MG tablet Take 0.5 tablets (12.5 mg total) by mouth daily. (Patient taking differently: Take 25 mg (1/4)  tablet daily.) 45 tablet 3  . metFORMIN (GLUCOPHAGE) 500 MG tablet TAKE 2 TABLETS BY MOUTH  TWICE DAILY WITH A MEAL 360 tablet 3  . methotrexate (RHEUMATREX) 2.5 MG tablet Take 8 tablets (20 mg total) by mouth once a week. Take 8 tablets every Friday. 8 tablet 0  . Multiple Vitamin (MULTIVITAMIN) tablet Take 1 tablet by mouth daily.    . nitroGLYCERIN (NITROSTAT) 0.4 MG SL tablet Place 1 tablet (0.4 mg total) under the tongue every 5 (five) minutes as needed. MAXIMUM OF 3 DOSES. 25 tablet 6  . pantoprazole (PROTONIX) 40 MG tablet TAKE 1 TABLET BY MOUTH  TWICE DAILY 180 tablet 3  . rosuvastatin (CRESTOR) 10 MG tablet Take 1 tablet (10 mg total) by mouth daily. (Patient taking differently: Take 5 mg by mouth daily. ) 90 tablet 0  . SYRINGE-NEEDLE, DISP, 3 ML 25G X 5/8" 3 ML MISC Use as instructed with B12 injection. 50 each 11  . triamcinolone cream (KENALOG) 0.5 % Apply 1 application topically 2 (two) times daily as needed (scaling).    . vitamin E 400 UNIT capsule Take 400 Units by mouth every evening.     No current facility-administered medications for this visit.    Allergies:   Ephedrine, Nsaids, Other, Vancomycin, Amoxicillin, Hyoscyamine sulfate, Levsin [hyoscyamine sulfate], Naproxen, Penicillins, Shellfish allergy, and Sudafed [pseudoephedrine hcl]     Social History:  The patient  reports that he quit smoking about 37 years ago. His smoking use included cigarettes. He has never used smokeless tobacco. He reports current alcohol use. He reports that he does not use drugs.   Family History:  [name] family history includes Aneurysm in his son; Cancer in his father; Hypertension in his mother; Lung cancer in his father.    ROS:  Please see the history of present illness.   Otherwise, review of systems are positive for none.   All other systems are reviewed and negative.    PHYSICAL EXAM: VS:  BP (!) 160/82 (BP Location: Left Arm,  Patient Position: Sitting, Cuff Size: Normal)   Pulse (!) 56   Ht 5' 8.5" (1.74 m)   Wt 187 lb (84.8 kg)   SpO2 98%   BMI 28.02 kg/m  , BMI Body mass index is 28.02 kg/m. GEN: Well nourished, well developed, in no acute distress  HEENT: normal  Neck: no JVD, carotid bruits, or masses Cardiac: RRR; no murmurs, rubs, or gallops,no edema  Respiratory:  clear to auscultation bilaterally, normal work of breathing GI: soft, nontender, nondistended, + BS MS: no deformity or atrophy  Skin: warm and dry, Mild rash on the back Neuro:  Strength and sensation are intact Psych: euthymic mood, full affect   EKG:  EKG is ordered today. The ekg ordered today demonstrates sinus bradycardia with no significant ST or T wave changes.   Recent Labs: 07/30/2019: ALT 20; BUN 13; Creatinine, Ser 0.77; Hemoglobin 12.5; Platelets 154.0; Potassium 3.8; Sodium 140; TSH 2.05    Lipid Panel    Component Value Date/Time   CHOL 124 07/30/2019 0802   TRIG 97.0 07/30/2019 0802   HDL 36.90 (L) 07/30/2019 0802   CHOLHDL 3 07/30/2019 0802   VLDL 19.4 07/30/2019 0802   LDLCALC 68 07/30/2019 0802      Wt Readings from Last 3 Encounters:  11/19/19 187 lb (84.8 kg)  08/06/19 186 lb 3.2 oz (84.5 kg)  05/07/19 183 lb (83 kg)      PAD Screen 05/21/2016  Previous PAD dx? No  Previous surgical procedure? No  Pain with  walking? No  Feet/toe relief with dangling? No  Painful, non-healing ulcers? No  Extremities discolored? No      ASSESSMENT AND PLAN:  1.  Coronary artery disease involving native coronary arteries with other forms of angina: He is doing very well at the present time with improvement in symptoms on medical therapy.  No changes in medications are recommended at the present time.    2. Hyperlipidemia: He is currently on small dose rosuvastatin.  Most recent lipid profile showed an LDL of 68 and triglyceride of 97.  3. Essential hypertension: Blood pressure is elevated today but he reports normal blood pressure readings at home.  Continue to monitor for now and consider increasing the dose of losartan if blood pressure remains elevated.  4.  Carotid disease: Most recent Doppler in March showed moderate left ICA stenosis.  Repeat study in March 2022.   Disposition:   FU with me in 6 months  Signed,  Kathlyn Sacramento, MD  11/19/2019 11:43 AM    [place]

## 2019-11-19 NOTE — Patient Instructions (Addendum)
Medication Instructions:  - Your physician recommends that you continue on your current medications as directed. Please refer to the Current Medication list given to you today.  *If you need a refill on your cardiac medications before your next appointment, please call your pharmacy*   Lab Work: - none ordered  If you have labs (blood work) drawn today and your tests are completely normal, you will receive your results only by: Marland Kitchen MyChart Message (if you have MyChart) OR . A paper copy in the mail If you have any lab test that is abnormal or we need to change your treatment, we will call you to review the results.   Testing/Procedures: - none ordered   Follow-Up: At [facility], you and your health needs are our priority.  As part of our continuing mission to provide you with exceptional heart care, we have created designated Provider Care Teams.  These Care Teams include your primary Cardiologist (physician) and Advanced Practice Providers (APPs -  Physician Assistants and Nurse Practitioners) who all work together to provide you with the care you need, when you need it.  We recommend signing up for the patient portal called "MyChart".  Sign up information is provided on this After Visit Summary.  MyChart is used to connect with patients for Virtual Visits (Telemedicine).  Patients are able to view lab/test results, encounter notes, upcoming appointments, etc.  Non-urgent messages can be sent to your provider as well.   To learn more about what you can do with MyChart, go to [email or web address].    Your next appointment:   6 month(s)  The format for your next appointment:   In Person  Provider:   You may see Kathlyn Sacramento, MD or one of the following Advanced Practice Providers on your designated Care Team:    Murray Hodgkins, NP  Christell Faith, PA-C  Marrianne Mood, PA-C  Cadence Kathlen Mody, [place]    Other Instructions n/a

## 2019-11-26 ENCOUNTER — Other Ambulatory Visit: Payer: Self-pay

## 2019-11-26 ENCOUNTER — Ambulatory Visit
Admission: RE | Admit: 2019-11-26 | Discharge: 2019-11-26 | Disposition: A | Discharge status: Home / Self Care | Payer: Medicare Other | Source: Ambulatory Visit | Attending: Family Medicine | Admitting: Family Medicine

## 2019-11-26 DIAGNOSIS — M545 Low back pain, unspecified: Secondary | ICD-10-CM | POA: Diagnosis not present

## 2019-11-26 DIAGNOSIS — G8929 Other chronic pain: Secondary | ICD-10-CM | POA: Diagnosis not present

## 2019-11-26 DIAGNOSIS — M5441 Lumbago with sciatica, right side: Secondary | ICD-10-CM | POA: Insufficient documentation

## 2019-12-03 DIAGNOSIS — M5416 Radiculopathy, lumbar region: Secondary | ICD-10-CM | POA: Diagnosis not present

## 2019-12-03 DIAGNOSIS — M5441 Lumbago with sciatica, right side: Secondary | ICD-10-CM | POA: Diagnosis not present

## 2019-12-03 DIAGNOSIS — M5136 Other intervertebral disc degeneration, lumbar region: Secondary | ICD-10-CM | POA: Diagnosis not present

## 2019-12-03 DIAGNOSIS — G8929 Other chronic pain: Secondary | ICD-10-CM | POA: Diagnosis not present

## 2019-12-06 ENCOUNTER — Other Ambulatory Visit: Payer: Self-pay | Admitting: Cardiovascular Disease

## 2019-12-06 ENCOUNTER — Other Ambulatory Visit: Payer: Self-pay | Admitting: Internal Medicine

## 2019-12-17 ENCOUNTER — Other Ambulatory Visit: Payer: Self-pay

## 2019-12-17 ENCOUNTER — Other Ambulatory Visit (INDEPENDENT_AMBULATORY_CARE_PROVIDER_SITE_OTHER): Payer: Medicare Other

## 2019-12-17 DIAGNOSIS — E0822 Diabetes mellitus due to underlying condition with diabetic chronic kidney disease: Secondary | ICD-10-CM

## 2019-12-17 DIAGNOSIS — E785 Hyperlipidemia, unspecified: Secondary | ICD-10-CM

## 2019-12-17 DIAGNOSIS — I10 Essential (primary) hypertension: Secondary | ICD-10-CM | POA: Diagnosis not present

## 2019-12-17 DIAGNOSIS — D649 Anemia, unspecified: Secondary | ICD-10-CM

## 2019-12-17 DIAGNOSIS — N183 Chronic kidney disease, stage 3 unspecified: Secondary | ICD-10-CM

## 2019-12-17 LAB — BASIC METABOLIC PANEL
BUN: 10 mg/dL (ref 6–23)
CO2: 28 mEq/L (ref 19–32)
Calcium: 8.9 mg/dL (ref 8.4–10.5)
Chloride: 104 mEq/L (ref 96–112)
Creatinine, Ser: 0.83 mg/dL (ref 0.40–1.50)
GFR: 88.24 mL/min (ref >60.00)
Glucose, Bld: 129 mg/dL — ABNORMAL HIGH (ref 70–99)
Potassium: 3.8 mEq/L (ref 3.5–5.1)
Sodium: 141 mEq/L (ref 135–145)

## 2019-12-17 LAB — CBC WITH DIFFERENTIAL/PLATELET
Basophils Absolute: 0 10*3/uL (ref 0.0–0.1)
Basophils Relative: 0.5 % (ref 0.0–3.0)
Eosinophils Absolute: 0.3 10*3/uL (ref 0.0–0.7)
Eosinophils Relative: 5.6 % — ABNORMAL HIGH (ref 0.0–5.0)
HCT: 37.2 % — ABNORMAL LOW (ref 39.0–52.0)
Hemoglobin: 12.3 g/dL — ABNORMAL LOW (ref 13.0–17.0)
Lymphocytes Relative: 18.9 % (ref 12.0–46.0)
Lymphs Abs: 0.9 10*3/uL (ref 0.7–4.0)
MCHC: 33.2 g/dL (ref 30.0–36.0)
MCV: 86 fl (ref 78.0–100.0)
Monocytes Absolute: 0.6 10*3/uL (ref 0.1–1.0)
Monocytes Relative: 12.8 % — ABNORMAL HIGH (ref 3.0–12.0)
Neutro Abs: 2.9 10*3/uL (ref 1.4–7.7)
Neutrophils Relative %: 62.2 % (ref 43.0–77.0)
Platelets: 164 10*3/uL (ref 150.0–400.0)
RBC: 4.33 Mil/uL (ref 4.22–5.81)
RDW: 18.3 % — ABNORMAL HIGH (ref 11.5–15.5)
WBC: 4.7 10*3/uL (ref 4.0–10.5)

## 2019-12-17 LAB — HEPATIC FUNCTION PANEL
ALT: 13 U/L (ref 0–53)
AST: 13 U/L (ref 0–37)
Albumin: 4.2 g/dL (ref 3.5–5.2)
Alkaline Phosphatase: 42 U/L (ref 39–117)
Bilirubin, Direct: 0.1 mg/dL (ref 0.0–0.3)
Total Bilirubin: 0.5 mg/dL (ref 0.2–1.2)
Total Protein: 6.9 g/dL (ref 6.0–8.3)

## 2019-12-17 LAB — VITAMIN B12: Vitamin B-12: 214 pg/mL (ref 211–911)

## 2019-12-17 LAB — LIPID PANEL
Cholesterol: 117 mg/dL (ref 0–200)
HDL: 35.9 mg/dL — ABNORMAL LOW (ref >39.00)
LDL Cholesterol: 59 mg/dL (ref 0–99)
NonHDL: 81
Total CHOL/HDL Ratio: 3
Triglycerides: 108 mg/dL (ref 0.0–149.0)
VLDL: 21.6 mg/dL (ref 0.0–40.0)

## 2019-12-17 LAB — IBC + FERRITIN
Ferritin: 13.6 ng/mL — ABNORMAL LOW (ref 22.0–322.0)
Iron: 21 ug/dL — ABNORMAL LOW (ref 42–165)
Saturation Ratios: 6 % — ABNORMAL LOW (ref 20.0–50.0)
Transferrin: 248 mg/dL (ref 212.0–360.0)

## 2019-12-17 LAB — MICROALBUMIN / CREATININE URINE RATIO
Creatinine,U: 219.5 mg/dL
Microalb Creat Ratio: 9.2 mg/g (ref 0.0–30.0)
Microalb, Ur: 20.2 mg/dL — ABNORMAL HIGH (ref 0.0–1.9)

## 2019-12-17 LAB — HEMOGLOBIN A1C: Hgb A1c MFr Bld: 6.7 % — ABNORMAL HIGH (ref 4.6–6.5)

## 2019-12-18 ENCOUNTER — Encounter: Payer: Self-pay | Admitting: Internal Medicine

## 2019-12-18 DIAGNOSIS — E538 Deficiency of other specified B group vitamins: Secondary | ICD-10-CM | POA: Insufficient documentation

## 2019-12-24 ENCOUNTER — Encounter: Payer: Self-pay | Admitting: Internal Medicine

## 2019-12-24 ENCOUNTER — Ambulatory Visit (INDEPENDENT_AMBULATORY_CARE_PROVIDER_SITE_OTHER): Payer: Medicare Other | Admitting: Internal Medicine

## 2019-12-24 ENCOUNTER — Other Ambulatory Visit: Payer: Self-pay

## 2019-12-24 DIAGNOSIS — K222 Esophageal obstruction: Secondary | ICD-10-CM | POA: Diagnosis not present

## 2019-12-24 DIAGNOSIS — I10 Essential (primary) hypertension: Secondary | ICD-10-CM

## 2019-12-24 DIAGNOSIS — N183 Chronic kidney disease, stage 3 unspecified: Secondary | ICD-10-CM

## 2019-12-24 DIAGNOSIS — M549 Dorsalgia, unspecified: Secondary | ICD-10-CM

## 2019-12-24 DIAGNOSIS — E0822 Diabetes mellitus due to underlying condition with diabetic chronic kidney disease: Secondary | ICD-10-CM | POA: Diagnosis not present

## 2019-12-24 DIAGNOSIS — E785 Hyperlipidemia, unspecified: Secondary | ICD-10-CM

## 2019-12-24 DIAGNOSIS — R945 Abnormal results of liver function studies: Secondary | ICD-10-CM

## 2019-12-24 DIAGNOSIS — K219 Gastro-esophageal reflux disease without esophagitis: Secondary | ICD-10-CM | POA: Diagnosis not present

## 2019-12-24 DIAGNOSIS — D649 Anemia, unspecified: Secondary | ICD-10-CM

## 2019-12-24 DIAGNOSIS — I25118 Atherosclerotic heart disease of native coronary artery with other forms of angina pectoris: Secondary | ICD-10-CM | POA: Diagnosis not present

## 2019-12-24 DIAGNOSIS — R7989 Other specified abnormal findings of blood chemistry: Secondary | ICD-10-CM

## 2019-12-24 DIAGNOSIS — L4050 Arthropathic psoriasis, unspecified: Secondary | ICD-10-CM

## 2019-12-24 NOTE — Progress Notes (Signed)
Patient ID: Shane Mcknight, male   DOB: 1950/02/14, 69 y.o.   MRN: [id number]   Subjective:    Patient ID: Shane Mcknight, male    DOB: 1950-09-28, 69 y.o.   MRN: [id number]  HPI This visit occurred during the SARS-CoV-2 public health emergency.  Safety protocols were in place, including screening questions prior to the visit, additional usage of staff PPE, and extensive cleaning of exam room while observing appropriate contact time as indicated for disinfecting solutions.  Patient here for a scheduled follow up.  Has been seeing GI - s/p balloon dilatation.  States recommended f/u q 4-5 weeks.  He does report swallowing is 50% improved.  Having to monitor his diet closely.  Watches what he eats.  Avoids increased meat, etc.  Denies any acid reflux or regurgitation of food.  Does avoid tomato sauce.  Mid October - sinus issues. Symptoms improved now.  Had episodes of occular migraine.  Stable now.  No acute or new symptoms.  Has not occurred over the last few weeks.  No chest pain or sob reported.  Right lateral - back - discomfort.  Seeing Dr Sharlet Salina.  Recent MRI.  Applying heat.  Taking gabapentin.  No abdominal pain.  Bowels stable.    Past Medical History:  Diagnosis Date  . Abnormal liver function tests   . Allergic state   . Anemia   . Arthritis    spine, hands, shoulder with previous cuff tear  . CAD (coronary artery disease)    a.  11/2015 CT Chest/Abd: Coronary Ca2+ noted;  b. MV: EF 45-54%, med defect of mod severity in basal infsept, basal inf, mid infsept, and mid inf region-> infarct and peri-infarct ischemia-->intermediate risk; c. 05/2016 Cath/PCI: LM 40ost, LAD 30p, 43m D1 60ost, LCX 60ost, OM1 99 (small->Med Rx), RCA 60p, 95d (2.5x15 Resolute Onyx DES), RPDA/RPLB min irregs, D2 60, EF 55-65%/  . Candidiasis of esophagus ([place]   . Colon polyp   . DDD (degenerative disc disease), lumbosacral   . Dysphagia   . Elevated transaminase level   . Esophageal stricture    a. s/p multiple  dilations - last 03/2016.  .Marland KitchenEsophagitis, [place] grade D   . Ganglion cyst of finger of right hand   . GERD (gastroesophageal reflux disease)   . Guaiac positive stools   . Hiatal hernia   . HNP (herniated nucleus pulposus), lumbar   . Hypercholesterolemia   . Hypertension   . Lumbar radiculitis   . Mallory-Weiss tear    a. 11/2015.  . Migraine aura without headache    migraine, visual  . Psoriasis    with psoriatic arthritis  . Psoriatic arthritis (HCC)    severe  . Schatzki's ring   . Sleep apnea    does not uses CPAP. has lost weight so is much better.  . Type II diabetes mellitus ([place]    Past Surgical History:  Procedure Laterality Date  . ARTHROPLASTY     right thumb; left thumb with 2 screws and plastic joint  . BALLOON DILATION N/A 05/29/2015   Procedure: BALLOON DILATION;  Surgeon: MLollie Sails MD;  [facility]  Service: Endoscopy;  Laterality: N/A;  . BALLOON DILATION N/A 02/14/2017   Procedure: BALLOON DILATION;  Surgeon: SLollie Sails MD;  [facility]  Service: Endoscopy;  Laterality: N/A;  . BILATERAL CARPAL TUNNEL RELEASE Bilateral 07/04/2017   Procedure: BILATERAL CARPAL TUNNEL RELEASE;  Surgeon: KLeanor Kail MD;  Location: [facility] ORS;  Service: Orthopedics;  Laterality: Bilateral;  . BLEPHAROPLASTY    . CARDIAC CATHETERIZATION     1 stent   . CARPAL TUNNEL RELEASE  2005   right  . CHOLECYSTECTOMY  2008  . COLONOSCOPY    . COLONOSCOPY WITH PROPOFOL N/A 02/13/2018   Procedure: COLONOSCOPY WITH PROPOFOL;  Surgeon: Lollie Sails, MD;  [facility];  Service: Endoscopy;  Laterality: N/A;  . CORONARY STENT INTERVENTION N/A 06/14/2016   Procedure: Coronary Stent Intervention;  Surgeon: Wellington Hampshire, MD;  Location: [place] CV LAB;  Service: Cardiovascular;  Laterality: N/A;  . CYST EXCISION  04/14/2015   tendon sheath cyst excision; right ring finger cyst removed and trigger finger realease  .  ESOPHAGOGASTRODUODENOSCOPY    . ESOPHAGOGASTRODUODENOSCOPY (EGD) WITH PROPOFOL N/A 05/29/2015   Procedure: ESOPHAGOGASTRODUODENOSCOPY (EGD) WITH PROPOFOL;  Surgeon: Lollie Sails, MD;  Location: [facility] ENDOSCOPY;  Service: Endoscopy;  Laterality: N/A;  . ESOPHAGOGASTRODUODENOSCOPY (EGD) WITH PROPOFOL N/A 07/04/2015   Procedure: ESOPHAGOGASTRODUODENOSCOPY (EGD) WITH PROPOFOL;  Surgeon: Lollie Sails, MD;  Location: [facility]COPY;  Service: Endoscopy;  Laterality: N/A;  . ESOPHAGOGASTRODUODENOSCOPY (EGD) WITH PROPOFOL N/A 01/04/2016   Procedure: ESOPHAGOGASTRODUODENOSCOPY (EGD) WITH PROPOFOL;  Surgeon: Lollie Sails, MD;  Location: [facility] ENDOSCOPY;  Service: Endoscopy;  Laterality: N/A;  . ESOPHAGOGASTRODUODENOSCOPY (EGD) WITH PROPOFOL N/A 03/22/2016   Procedure: ESOPHAGOGASTRODUODENOSCOPY (EGD) WITH PROPOFOL;  Surgeon: Lollie Sails, MD;  Location: [facility] ENDOSCOPY;  Service: Endoscopy;  Laterality: N/A;  . ESOPHAGOGASTRODUODENOSCOPY (EGD) WITH PROPOFOL N/A 02/14/2017   Procedure: ESOPHAGOGASTRODUODENOSCOPY (EGD) WITH PROPOFOL;  Surgeon: Lollie Sails, MD;  Location: [facility] ENDOSCOPY;  Service: Endoscopy;  Laterality: N/A;  . ESOPHAGOGASTRODUODENOSCOPY (EGD) WITH PROPOFOL N/A 04/11/2017   Procedure: ESOPHAGOGASTRODUODENOSCOPY (EGD) WITH PROPOFOL;  Surgeon: Lollie Sails, MD;  Location: [facility] ENDOSCOPY;  Service: Endoscopy;  Laterality: N/A;  . ESOPHAGOGASTRODUODENOSCOPY (EGD) WITH PROPOFOL N/A 07/07/2017   Procedure: ESOPHAGOGASTRODUODENOSCOPY (EGD) WITH PROPOFOL;  Surgeon: Lucilla Lame, MD;  Location: [facility] ENDOSCOPY;  Service: Endoscopy;  Laterality: N/A;  . ESOPHAGOGASTRODUODENOSCOPY (EGD) WITH PROPOFOL N/A 02/13/2018   Procedure: ESOPHAGOGASTRODUODENOSCOPY (EGD) WITH PROPOFOL;  Surgeon: Lollie Sails, MD;  Location: [facility] ENDOSCOPY;  Service: Endoscopy;  Laterality: N/A;  . EYE SURGERY    . FOREIGN BODY REMOVAL N/A 12/03/2015   Procedure: FOREIGN BODY REMOVAL;  Surgeon: Wilford Corner, MD;  Location: [facility] ENDOSCOPY;  Service: Endoscopy;  Laterality: N/A;  . HERNIA REPAIR Right 1970   inguinal  . INGUINAL HERNIA REPAIR Left 01/01/2018   Procedure: OPEN HERNIA REPAIR INGUINAL ADULT;  Surgeon: Olean Ree, MD;  Location: [facility] ORS;  Service: General;  Laterality: Left;  . LEFT HEART CATH Left 06/14/2016   Procedure: Left Heart Cath;  Surgeon: Wellington Hampshire, MD;  Location: [place] CV LAB;  Service: Cardiovascular;  Laterality: Left;  . LEFT HEART CATH AND CORONARY ANGIOGRAPHY Left 09/14/2018   Procedure: LEFT HEART CATH AND CORONARY ANGIOGRAPHY;  Surgeon: Wellington Hampshire, MD;  Location: [place] CV LAB;  Service: Cardiovascular;  Laterality: Left;  . LUMBAR DISC SURGERY  2004   injections; herniated disc; percutaneous discectomy  . NASAL SINUS SURGERY  2008  . percutaneous lumbar discectomy    . ROTATOR CUFF REPAIR Right   . TONSILLECTOMY     Family History  Problem Relation Age of Onset  . Lung cancer Father   . Cancer Father   . Hypertension Mother   . Aneurysm Son    Social History   Socioeconomic History  . Marital status:  Married    Spouse name: Pamala Hurry  . Number of children: Not on file  . Years of education: Not on file  . Highest education level: Not on file  Occupational History    Comment: not heavy work  Tobacco Use  . Smoking status: Former Smoker    Types: Cigarettes    Quit date: 07/06/1982    Years since quitting: 37.4  . Smokeless tobacco: Never Used  Vaping Use  . Vaping Use: Never used  Substance and Sexual Activity  . Alcohol use: Yes    Alcohol/week: 0.0 standard drinks  . Drug use: No  . Sexual activity: Not on file  Other Topics Concern  . Not on file  Social History Narrative   Lives locally with wife.  Active but does not routinely exercise.   Social Determinants of Health   Financial Resource Strain: Not on file  Food Insecurity: Not on file  Transportation Needs: Not on file  Physical Activity:  Not on file  Stress: Not on file  Social Connections: Not on file    Outpatient Encounter Medications as of 12/24/2019  Medication Sig  . acetaminophen (TYLENOL) 500 MG tablet Take 1,000 mg by mouth daily as needed for moderate pain.  Marland Kitchen aspirin EC 81 MG tablet Take 81 mg by mouth every evening.   Marland Kitchen atenolol (TENORMIN) 50 MG tablet TAKE 1 TABLET BY MOUTH  TWICE DAILY  . COSENTYX SENSOREADY PEN 150 MG/ML SOAJ Inject 300 mg into the skin every 30 (thirty) days.   . cyanocobalamin (,VITAMIN B-12,) 1000 MCG/ML injection Inject 1 mL (1,000 mcg total) into the muscle every 30 (thirty) days.  . folic acid (FOLVITE) 1 MG tablet Take 1 mg by mouth every evening.   . gabapentin (NEURONTIN) 300 MG capsule TAKE 1 TO 2 CAPSULES BY  MOUTH AT BEDTIME  . isosorbide mononitrate (IMDUR) 30 MG 24 hr tablet TAKE 1 TABLET BY MOUTH  DAILY  . metFORMIN (GLUCOPHAGE) 500 MG tablet TAKE 2 TABLETS BY MOUTH  TWICE DAILY WITH A MEAL  . methotrexate (RHEUMATREX) 2.5 MG tablet Take 8 tablets (20 mg total) by mouth once a week. Take 8 tablets every Friday.  . Multiple Vitamin (MULTIVITAMIN) tablet Take 1 tablet by mouth daily.  . nitroGLYCERIN (NITROSTAT) 0.4 MG SL tablet Place 1 tablet (0.4 mg total) under the tongue every 5 (five) minutes as needed. MAXIMUM OF 3 DOSES.  Marland Kitchen pantoprazole (PROTONIX) 40 MG tablet TAKE 1 TABLET BY MOUTH  TWICE DAILY  . rosuvastatin (CRESTOR) 10 MG tablet Take 1 tablet (10 mg total) by mouth daily. (Patient taking differently: Take 5 mg by mouth daily.)  . SYRINGE-NEEDLE, DISP, 3 ML 25G X 5/8" 3 ML MISC Use as instructed with B12 injection.  . triamcinolone cream (KENALOG) 0.5 % Apply 1 application topically 2 (two) times daily as needed (scaling).  . vitamin E 400 UNIT capsule Take 400 Units by mouth every evening.  Marland Kitchen losartan (COZAAR) 25 MG tablet Take 0.5 tablets (12.5 mg total) by mouth daily. (Patient taking differently: Take 25 mg (1/4)  tablet daily.)   No facility-administered encounter  medications on file as of 12/24/2019.   Review of Systems  Constitutional: Negative for appetite change and unexpected weight change.  HENT: Negative for congestion and sinus pressure.   Respiratory: Negative for cough, chest tightness and shortness of breath.   Cardiovascular: Negative for chest pain, palpitations and leg swelling.  Gastrointestinal: Negative for abdominal pain, diarrhea, nausea and vomiting.  Swallowing issues as outlined.   Genitourinary: Negative for difficulty urinating and dysuria.  Musculoskeletal: Positive for back pain. Negative for myalgias.  Skin: Negative for color change and rash.  Neurological: Negative for dizziness, light-headedness and headaches.  Psychiatric/Behavioral: Negative for agitation and dysphoric mood.       Objective:    Physical Exam Vitals reviewed.  Constitutional:      General: He is not in acute distress.    Appearance: Normal appearance. He is well-developed and well-nourished.  HENT:     Head: Normocephalic and atraumatic.     Right Ear: External ear normal.     Left Ear: External ear normal.  Eyes:     General: No scleral icterus.       Right eye: No discharge.        Left eye: No discharge.     Conjunctiva/sclera: Conjunctivae normal.  Cardiovascular:     Rate and Rhythm: Normal rate and regular rhythm.  Pulmonary:     Effort: Pulmonary effort is normal. No respiratory distress.     Breath sounds: Normal breath sounds.  Abdominal:     General: Bowel sounds are normal.     Palpations: Abdomen is soft.     Tenderness: There is no abdominal tenderness.  Musculoskeletal:        General: No swelling, tenderness or edema.     Cervical back: Neck supple. No tenderness.  Lymphadenopathy:     Cervical: No cervical adenopathy.  Skin:    Findings: No erythema or rash.  Neurological:     Mental Status: He is alert.  Psychiatric:        Mood and Affect: Mood and affect and mood normal.        Behavior: Behavior  normal.     BP 122/62   Pulse 77   Temp 98.5 F (36.9 C)   Ht 5' 8.5" (1.74 m)   Wt 183 lb 3.2 oz (83.1 kg)   SpO2 97%   BMI 27.45 kg/m  Wt Readings from Last 3 Encounters:  12/24/19 183 lb 3.2 oz (83.1 kg)  11/19/19 187 lb (84.8 kg)  08/06/19 186 lb 3.2 oz (84.5 kg)     Lab Results  Component Value Date   WBC 4.7 12/17/2019   HGB 12.3 (L) 12/17/2019   HCT 37.2 (L) 12/17/2019   PLT 164.0 12/17/2019   GLUCOSE 129 (H) 12/17/2019   CHOL 117 12/17/2019   TRIG 108.0 12/17/2019   HDL 35.90 (L) 12/17/2019   LDLCALC 59 12/17/2019   ALT 13 12/17/2019   AST 13 12/17/2019   NA 141 12/17/2019   K 3.8 12/17/2019   CL 104 12/17/2019   CREATININE 0.83 12/17/2019   BUN 10 12/17/2019   CO2 28 12/17/2019   TSH 2.05 07/30/2019   PSA 1.82 02/26/2019   INR 1.1 06/07/2016   HGBA1C 6.7 (H) 12/17/2019   MICROALBUR 20.2 (H) 12/17/2019    MR LUMBAR SPINE WO CONTRAST  Result Date: 11/26/2019 CLINICAL DATA:  Low back pain radiating to both flank regions. EXAM: MRI LUMBAR SPINE WITHOUT CONTRAST TECHNIQUE: Multiplanar, multisequence MR imaging of the lumbar spine was performed. No intravenous contrast was administered. COMPARISON:  04/29/2014 FINDINGS: Segmentation:  5 lumbar type vertebral bodies. Alignment:  Minimal curvature convex to the left. Vertebrae:  No fracture or primary bone lesion. Conus medullaris and cauda equina: Conus extends to the T12-L1 level. Conus and cauda equina appear normal. Paraspinal and other soft tissues: Negative Disc levels: T11-12: Disc bulge.  No neural compression. T12-L1: Right paracentral disc herniation with caudal migration behind the superior aspect of L1. Indentation of the thecal sac with narrowing of the right lateral recess. This could be symptomatic. No foraminal extension. L1-2: Disc bulge without visible neural compression. L2-3: Chronic disc degeneration with loss of disc height. Endplate osteophytes and bulging of the disc, involuted slightly since  2016. Mild stenosis of both lateral recesses with some potential to affect either L3 nerve. L3-4: Mild bulging of the disc.  No compressive stenosis. L4-5: Endplate osteophytes and bulging of the disc more prominent towards the left. Bilateral facet and ligamentous hypertrophy more prominent on the left. Stenosis of both lateral recesses, left more than right. L5 neural compression could occur on either side at this level, more likely the left. Compared to the previous study, the left-sided disc material has involuted, but there does remain potential for neural compression. L5-S1: Minimal noncompressive disc bulge. IMPRESSION: 1. T12-L1: Right paracentral disc herniation with caudal migration behind the superior aspect of L1. Narrowing of the right lateral recess that could be symptomatic. 2. L2-3: Chronic disc degeneration with loss of disc height. Endplate osteophytes and bulging of the disc, involuted slightly since 2016. Mild stenosis of both lateral recesses with some potential to affect either L3 nerve. 3. L4-5: Some involution of left-sided disc material seen in 2016. Endplate osteophytes and bulging of the disc more prominent towards the left does persist. Bilateral facet and ligamentous hypertrophy more prominent on the left. Stenosis of both lateral recesses left more than right. L5 neural compression could occur on either side, more likely the left. Electronically Signed   By: Nelson Chimes M.D.   On: 11/26/2019 11:19       Assessment & Plan:   Problem List Items Addressed This Visit    Stricture and stenosis of esophagus    S/p EGD and dilatation.  Recommended f/u q 4-5 weeks.  Is swallowing better.        Psoriatic arthritis ([place])    Followed by Dr Jefm Bryant. Cosentyx.  Stable.       Hypertension    Blood pressure on recheck improved.  Continue atenolol, isosorbide and losartan.  Follow pressures.  Follow metabolic panel.       Hyperlipidemia    On crestor.  Low cholesterol diet and  exercise.  Follow lipid panel and liver function tests.        GERD (gastroesophageal reflux disease)    Avoid tomato sauce.  On protonix.  Follow.       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  Follow met b and a1c.   Lab Results  Component Value Date   HGBA1C 6.7 (H) 12/17/2019        CAD (coronary artery disease)    S/p stent RCA.  Followed by cardiology.  Continue risk factor modification.  Continue isosorbide, atenolol and crestor.        Back pain    Continue f/u with Dr Sharlet Salina.  Continues with heat and gabapentin.        Anemia    Follow cbc. Slightly decreased hgb.  Iron low.  Relates to his diet.  Unable to tolerate oral iron.  Discussed referral for iron infusion.  He wants to try to adjust his diet.  Wants to monitor cbc.  Follow.        Relevant Orders  CBC with Differential/Platelet   IBC + Ferritin   Abnormal liver function tests    Follow liver function tests.           Einar Pheasant, MD

## 2019-12-25 ENCOUNTER — Encounter: Payer: Self-pay | Admitting: Internal Medicine

## 2019-12-25 NOTE — Assessment & Plan Note (Signed)
Blood pressure on recheck improved.  Continue atenolol, isosorbide and losartan.  Follow pressures.  Follow metabolic panel.

## 2019-12-25 NOTE — Assessment & Plan Note (Signed)
On crestor.  Low cholesterol diet and exercise.  Follow lipid panel and liver function tests.   

## 2019-12-25 NOTE — Assessment & Plan Note (Signed)
Avoid tomato sauce.  On protonix.  Follow.

## 2019-12-25 NOTE — Assessment & Plan Note (Addendum)
Follow cbc. Slightly decreased hgb.  Iron low.  Relates to his diet.  Unable to tolerate oral iron.  Discussed referral for iron infusion.  He wants to try to adjust his diet.  Wants to monitor cbc.  Follow.

## 2019-12-25 NOTE — Assessment & Plan Note (Signed)
S/p stent RCA.  Followed by cardiology.  Continue risk factor modification.  Continue isosorbide, atenolol and crestor.

## 2019-12-25 NOTE — Assessment & Plan Note (Signed)
S/p EGD and dilatation.  Recommended f/u q 4-5 weeks.  Is swallowing better.

## 2019-12-25 NOTE — Assessment & Plan Note (Signed)
Continue f/u with Dr Sharlet Salina.  Continues with heat and gabapentin.

## 2019-12-25 NOTE — Assessment & Plan Note (Signed)
Follow liver function tests.   

## 2019-12-25 NOTE — Assessment & Plan Note (Signed)
Followed by Dr Jefm Bryant. Cosentyx.  Stable.

## 2019-12-25 NOTE — Assessment & Plan Note (Signed)
Low carb diet and exercise.  Follow met b and a1c.   Lab Results  Component Value Date   HGBA1C 6.7 (H) 12/17/2019

## 2020-02-09 ENCOUNTER — Other Ambulatory Visit: Payer: Self-pay | Admitting: Internal Medicine

## 2020-02-15 ENCOUNTER — Ambulatory Visit (INDEPENDENT_AMBULATORY_CARE_PROVIDER_SITE_OTHER): Payer: Medicare Other

## 2020-02-15 VITALS — Ht 68.5 in | Wt 183.0 lb

## 2020-02-15 DIAGNOSIS — Z0000 Encounter for general adult medical examination without abnormal findings: Secondary | ICD-10-CM | POA: Diagnosis not present

## 2020-02-15 NOTE — Progress Notes (Signed)
Subjective:   WAH SABIC is a 70 y.o. male who presents for Medicare Annual/Subsequent preventive examination.  Review of Systems    No ROS.  Medicare Wellness Virtual Visit.   Cardiac Risk Factors include: advanced age (>85men, >51 women);male gender;hypertension;diabetes mellitus     Objective:    Today's Vitals   02/15/20 1232  Weight: 183 lb (83 kg)  Height: 5' 8.5" (1.74 m)   Body mass index is 27.42 kg/m.  Advanced Directives 02/15/2020 02/12/2019 09/22/2018 09/14/2018 02/13/2018 01/01/2018 12/26/2017  Does Patient Have a Medical Advance Directive? Yes Yes Yes Yes Yes Yes Yes  Type of Paramedic of [place];Living will Healthcare Power of [place] of [place];Living will Living will [place];Living will Living will;Healthcare Power of Attorney  Does patient want to make changes to medical advance directive? No - Patient declined No - Patient declined No - Patient declined - - - No - Patient declined  Copy of [place] in Chart? No - copy requested No - copy requested No - copy requested No - copy requested - - No - copy requested  Would patient like information on creating a medical advance directive? - - No - Patient declined - - - -    Current Medications (verified) Outpatient Encounter Medications as of 02/15/2020  Medication Sig  . acetaminophen (TYLENOL) 500 MG tablet Take 1,000 mg by mouth daily as needed for moderate pain.  Marland Kitchen aspirin EC 81 MG tablet Take 81 mg by mouth every evening.   Marland Kitchen atenolol (TENORMIN) 50 MG tablet TAKE 1 TABLET BY MOUTH  TWICE DAILY  . COSENTYX SENSOREADY PEN 150 MG/ML SOAJ Inject 300 mg into the skin every 30 (thirty) days.   . cyanocobalamin (,VITAMIN B-12,) 1000 MCG/ML injection Inject 1 mL (1,000 mcg total) into the muscle every 30 (thirty) days.  . folic acid (FOLVITE) 1 MG tablet Take 1 mg by mouth every evening.   . gabapentin  (NEURONTIN) 300 MG capsule TAKE 1 TO 2 CAPSULES BY  MOUTH AT BEDTIME  . isosorbide mononitrate (IMDUR) 30 MG 24 hr tablet TAKE 1 TABLET BY MOUTH  DAILY  . losartan (COZAAR) 25 MG tablet Take 0.5 tablets (12.5 mg total) by mouth daily. (Patient taking differently: Take 25 mg (1/4)  tablet daily.)  . metFORMIN (GLUCOPHAGE) 500 MG tablet TAKE 2 TABLETS BY MOUTH  TWICE DAILY WITH A MEAL  . methotrexate (RHEUMATREX) 2.5 MG tablet Take 8 tablets (20 mg total) by mouth once a week. Take 8 tablets every Friday.  . Multiple Vitamin (MULTIVITAMIN) tablet Take 1 tablet by mouth daily.  . nitroGLYCERIN (NITROSTAT) 0.4 MG SL tablet Place 1 tablet (0.4 mg total) under the tongue every 5 (five) minutes as needed. MAXIMUM OF 3 DOSES.  Marland Kitchen pantoprazole (PROTONIX) 40 MG tablet TAKE 1 TABLET BY MOUTH  TWICE DAILY  . rosuvastatin (CRESTOR) 10 MG tablet Take 1 tablet (10 mg total) by mouth daily. (Patient taking differently: Take 5 mg by mouth daily.)  . SYRINGE-NEEDLE, DISP, 3 ML 25G X 5/8" 3 ML MISC Use as instructed with B12 injection.  . triamcinolone cream (KENALOG) 0.5 % Apply 1 application topically 2 (two) times daily as needed (scaling).  . vitamin E 400 UNIT capsule Take 400 Units by mouth every evening.   No facility-administered encounter medications on file as of 02/15/2020.    Allergies (verified) Ephedrine, Nsaids, Other, Vancomycin, Amoxicillin, Hyoscyamine sulfate, Levsin [hyoscyamine sulfate], Naproxen, Penicillins,  Shellfish allergy, and Sudafed [pseudoephedrine hcl]   History: Past Medical History:  Diagnosis Date  . Abnormal liver function tests   . Allergic state   . Anemia   . Arthritis    spine, hands, shoulder with previous cuff tear  . CAD (coronary artery disease)    a.  11/2015 CT Chest/Abd: Coronary Ca2+ noted;  b. MV: EF 45-54%, med defect of mod severity in basal infsept, basal inf, mid infsept, and mid inf region-> infarct and peri-infarct ischemia-->intermediate risk; c. 05/2016  Cath/PCI: LM 40ost, LAD 30p, 56m, D1 60ost, LCX 60ost, OM1 99 (small->Med Rx), RCA 60p, 95d (2.5x15 Resolute Onyx DES), RPDA/RPLB min irregs, D2 60, EF 55-65%/  . Candidiasis of esophagus ([place])   . Colon polyp   . DDD (degenerative disc disease), lumbosacral   . Dysphagia   . Elevated transaminase level   . Esophageal stricture    a. s/p multiple dilations - last 03/2016.  Marland Kitchen Esophagitis, [place] grade D   . Ganglion cyst of finger of right hand   . GERD (gastroesophageal reflux disease)   . Guaiac positive stools   . Hiatal hernia   . HNP (herniated nucleus pulposus), lumbar   . Hypercholesterolemia   . Hypertension   . Lumbar radiculitis   . Mallory-Weiss tear    a. 11/2015.  . Migraine aura without headache    migraine, visual  . Psoriasis    with psoriatic arthritis  . Psoriatic arthritis (HCC)    severe  . Schatzki's ring   . Sleep apnea    does not uses CPAP. has lost weight so is much better.  . Type II diabetes mellitus ([place])    Past Surgical History:  Procedure Laterality Date  . ARTHROPLASTY     right thumb; left thumb with 2 screws and plastic joint  . BALLOON DILATION N/A 05/29/2015   Procedure: BALLOON DILATION;  Surgeon: Lollie Sails, MD;  Location: [facility] ENDOSCOPY;  Service: Endoscopy;  Laterality: N/A;  . BALLOON DILATION N/A 02/14/2017   Procedure: BALLOON DILATION;  Surgeon: Lollie Sails, MD;  Location: [facility] ENDOSCOPY;  Service: Endoscopy;  Laterality: N/A;  . BILATERAL CARPAL TUNNEL RELEASE Bilateral 07/04/2017   Procedure: BILATERAL CARPAL TUNNEL RELEASE;  Surgeon: Leanor Kail, MD;  Location: [facility] ORS;  Service: Orthopedics;  Laterality: Bilateral;  . BLEPHAROPLASTY    . CARDIAC CATHETERIZATION     1 stent   . CARPAL TUNNEL RELEASE  2005   right  . CHOLECYSTECTOMY  2008  . COLONOSCOPY    . COLONOSCOPY WITH PROPOFOL N/A 02/13/2018   Procedure: COLONOSCOPY WITH PROPOFOL;  Surgeon: Lollie Sails, MD;  Location: [facility] ENDOSCOPY;   Service: Endoscopy;  Laterality: N/A;  . CORONARY STENT INTERVENTION N/A 06/14/2016   Procedure: Coronary Stent Intervention;  Surgeon: Wellington Hampshire, MD;  Location: [facility] CV LAB;  Service: Cardiovascular;  Laterality: N/A;  . CYST EXCISION  04/14/2015   tendon sheath cyst excision; right ring finger cyst removed and trigger finger realease  . ESOPHAGOGASTRODUODENOSCOPY    . ESOPHAGOGASTRODUODENOSCOPY (EGD) WITH PROPOFOL N/A 05/29/2015   Procedure: ESOPHAGOGASTRODUODENOSCOPY (EGD) WITH PROPOFOL;  Surgeon: Lollie Sails, MD;  Location: [facility] ENDOSCOPY;  Service: Endoscopy;  Laterality: N/A;  . ESOPHAGOGASTRODUODENOSCOPY (EGD) WITH PROPOFOL N/A 07/04/2015   Procedure: ESOPHAGOGASTRODUODENOSCOPY (EGD) WITH PROPOFOL;  Surgeon: Lollie Sails, MD;  Location: [facility] ENDOSCOPY;  Service: Endoscopy;  Laterality: N/A;  . ESOPHAGOGASTRODUODENOSCOPY (EGD) WITH PROPOFOL N/A 01/04/2016   Procedure: ESOPHAGOGASTRODUODENOSCOPY (EGD) WITH PROPOFOL;  Surgeon:  Lollie Sails, MD;  Location: [facility] ENDOSCOPY;  Service: Endoscopy;  Laterality: N/A;  . ESOPHAGOGASTRODUODENOSCOPY (EGD) WITH PROPOFOL N/A 03/22/2016   Procedure: ESOPHAGOGASTRODUODENOSCOPY (EGD) WITH PROPOFOL;  Surgeon: Lollie Sails, MD;  Location: [facility] ENDOSCOPY;  Service: Endoscopy;  Laterality: N/A;  . ESOPHAGOGASTRODUODENOSCOPY (EGD) WITH PROPOFOL N/A 02/14/2017   Procedure: ESOPHAGOGASTRODUODENOSCOPY (EGD) WITH PROPOFOL;  Surgeon: Lollie Sails, MD;  Location: [facility] ENDOSCOPY;  Service: Endoscopy;  Laterality: N/A;  . ESOPHAGOGASTRODUODENOSCOPY (EGD) WITH PROPOFOL N/A 04/11/2017   Procedure: ESOPHAGOGASTRODUODENOSCOPY (EGD) WITH PROPOFOL;  Surgeon: Lollie Sails, MD;  Location: [facility] ENDOSCOPY;  Service: Endoscopy;  Laterality: N/A;  . ESOPHAGOGASTRODUODENOSCOPY (EGD) WITH PROPOFOL N/A 07/07/2017   Procedure: ESOPHAGOGASTRODUODENOSCOPY (EGD) WITH PROPOFOL;  Surgeon: Lucilla Lame, MD;  Location: [facility] ENDOSCOPY;  Service: Endoscopy;   Laterality: N/A;  . ESOPHAGOGASTRODUODENOSCOPY (EGD) WITH PROPOFOL N/A 02/13/2018   Procedure: ESOPHAGOGASTRODUODENOSCOPY (EGD) WITH PROPOFOL;  Surgeon: Lollie Sails, MD;  Location: [facility] ENDOSCOPY;  Service: Endoscopy;  Laterality: N/A;  . EYE SURGERY    . FOREIGN BODY REMOVAL N/A 12/03/2015   Procedure: FOREIGN BODY REMOVAL;  Surgeon: Wilford Corner, MD;  Location: [facility] ENDOSCOPY;  Service: Endoscopy;  Laterality: N/A;  . HERNIA REPAIR Right 1970   inguinal  . INGUINAL HERNIA REPAIR Left 01/01/2018   Procedure: OPEN HERNIA REPAIR INGUINAL ADULT;  Surgeon: Olean Ree, MD;  Location: [facility] ORS;  Service: General;  Laterality: Left;  . LEFT HEART CATH Left 06/14/2016   Procedure: Left Heart Cath;  Surgeon: Wellington Hampshire, MD;  Location: [place] CV LAB;  Service: Cardiovascular;  Laterality: Left;  . LEFT HEART CATH AND CORONARY ANGIOGRAPHY Left 09/14/2018   Procedure: LEFT HEART CATH AND CORONARY ANGIOGRAPHY;  Surgeon: Wellington Hampshire, MD;  Location: [place] CV LAB;  Service: Cardiovascular;  Laterality: Left;  . LUMBAR DISC SURGERY  2004   injections; herniated disc; percutaneous discectomy  . NASAL SINUS SURGERY  2008  . percutaneous lumbar discectomy    . ROTATOR CUFF REPAIR Right   . TONSILLECTOMY     Family History  Problem Relation Age of Onset  . Lung cancer Father   . Cancer Father   . Hypertension Mother   . Aneurysm Son    Social History   Socioeconomic History  . Marital status: Married    Spouse name: Pamala Hurry  . Number of children: Not on file  . Years of education: Not on file  . Highest education level: Not on file  Occupational History    Comment: not heavy work  Tobacco Use  . Smoking status: Former Smoker    Types: Cigarettes    Quit date: 07/06/1982    Years since quitting: 37.6  . Smokeless tobacco: Never Used  Vaping Use  . Vaping Use: Never used  Substance and Sexual Activity  . Alcohol use: Yes    Alcohol/week: 0.0 standard  drinks  . Drug use: No  . Sexual activity: Not on file  Other Topics Concern  . Not on file  Social History Narrative   Lives locally with wife.  Active but does not routinely exercise.   Social Determinants of Health   Financial Resource Strain: Low Risk   . Difficulty of Paying Living Expenses: Not hard at all  Food Insecurity: No Food Insecurity  . Worried About Charity fundraiser in the Last Year: Never true  . Ran Out of Food in the Last Year: Never true  Transportation Needs: No Transportation Needs  . Lack of Transportation (  Medical): No  . Lack of Transportation (Non-Medical): No  Physical Activity: Not on file  Stress: No Stress Concern Present  . Feeling of Stress : Not at all  Social Connections: Unknown  . Frequency of Communication with Friends and Family: Not on file  . Frequency of Social Gatherings with Friends and Family: Not on file  . Attends Religious Services: Not on file  . Active Member of Clubs or Organizations: Not on file  . Attends Archivist Meetings: Not on file  . Marital Status: Married    Tobacco Counseling Counseling given: Not Answered   Clinical Intake:  Pre-visit preparation completed: Yes        Diabetes: Yes (Followed by pcp)  How often do you need to have someone help you when you read instructions, pamphlets, or other written materials from your doctor or pharmacy?: 1 - Never  Nutrition Risk Assessment: Has the patient had any N/V/D within the last 2 weeks?  No  Has the patient had any unintentional weight loss or weight gain?  No   Diabetes: If diabetic, was a CBG obtained today?  No  Did the patient bring in their glucometer from home?  No  How often do you monitor your CBG's? He tries to check it weekly.   Financial Strains and Diabetes Management: Are you having any financial strains with the device, your supplies or your medication? No .  Does the patient want to be seen by Chronic Care Management for  management of their diabetes?  No  Would the patient like to be referred to a Nutritionist or for Diabetic Management?  No   Diabetic Exams: Diabetic Eye Exam: Overdue for diabetic eye exam. Pt has been advised about the importance in completing this exam. Patient advised to call and schedule an eye exam.  Diabetic Foot Exam: followed by pcp. Denies changes in feet.   Interpreter Needed?: No      Activities of Daily Living In your present state of health, do you have any difficulty performing the following activities: 02/15/2020  Hearing? N  Vision? N  Difficulty concentrating or making decisions? N  Walking or climbing stairs? N  Dressing or bathing? N  Doing errands, shopping? N  Preparing Food and eating ? N  Using the Toilet? N  In the past six months, have you accidently leaked urine? N  Do you have problems with loss of bowel control? N  Managing your Medications? N  Managing your Finances? N  Housekeeping or managing your Housekeeping? N  Some recent data might be hidden    Patient Care Team: Einar Pheasant, MD as PCP - General (Internal Medicine) Wellington Hampshire, MD as PCP - Cardiology (Cardiology)  Indicate any recent Medical Services you may have received from other than [facility] providers in the past year (date may be approximate).     Assessment:   This is a routine wellness examination for Shane Mcknight.  I connected with Jontae today by telephone and verified that I am speaking with the correct person using two identifiers. Location patient: home Location provider: work Persons participating in the virtual visit: patient, Marine scientist.    I discussed the limitations, risks, security and privacy concerns of performing an evaluation and management service by telephone and the availability of in person appointments. The patient expressed understanding and verbally consented to this telephonic visit.    Interactive audio and video telecommunications were attempted between this  provider and patient, however failed, due to patient having technical  difficulties OR patient did not have access to video capability.  We continued and completed visit with audio only.  Some vital signs may be absent or patient reported.   Hearing/Vision screen  Hearing Screening   125Hz  250Hz  500Hz  1000Hz  2000Hz  3000Hz  4000Hz  6000Hz  8000Hz   Right ear:           Left ear:           Comments: Patient is able to hear conversational tones without difficulty.  No issues reported.  Vision Screening Comments: Followed by [facility] Wears corrective lenses Visual acuity not assessed, virtual visit.  They have seen their ophthalmologist in the last 12 months.    Dietary issues and exercise activities discussed: Current Exercise Habits: Home exercise routine, Type of exercise: walking, Intensity: Mild  Healthy diet Good water intake  Goals    . Follow up with Primary Care Provider     As needed      Depression Screen PHQ 2/9 Scores 02/15/2020 12/24/2019 02/12/2019 09/22/2018 02/21/2017 02/20/2016 06/30/2014  PHQ - 2 Score 0 0 0 0 0 0 0    Fall Risk Fall Risk  02/15/2020 12/24/2019 02/12/2019 11/16/2018 01/23/2018  Falls in the past year? 0 0 0 0 0  Number falls in past yr: 0 0 - 0 0  Injury with Fall? 0 0 - 0 0  Follow up Falls evaluation completed Falls evaluation completed Falls evaluation completed Falls evaluation completed -    FALL RISK PREVENTION PERTAINING TO THE HOME: Handrails in use when climbing stairs? Yes Home free of loose throw rugs in walkways, pet beds, electrical cords, etc? Yes  Adequate lighting in your home to reduce risk of falls? Yes   ASSISTIVE DEVICES UTILIZED TO PREVENT FALLS: Use of a cane, walker or w/c? No   TIMED UP AND GO: Was the test performed? No . Virtual visit.   Cognitive Function:  Patient is alert and oriented x3.  Denies difficulty focusing, making decisions, memory loss.  MMSE/6CIT deferred. Normal by direct communication/observation.     6CIT Screen 02/12/2019  What Year? 0 points  What month? 0 points  What time? 0 points  Count back from 20 0 points  Months in reverse 0 points  Repeat phrase 0 points  Total Score 0    Immunizations Immunization History  Administered Date(s) Administered  . Janssen (J&J) SARS-COV-2 Vaccination 05/25/2019  . Moderna Sars-Covid-2 Vaccination 11/08/2019  . Pneumococcal Polysaccharide-23 11/22/2016  . Td 11/22/2016   Health Maintenance Health Maintenance  Topic Date Due  . PNA vac Low Risk Adult (2 of 2 - PCV13) 11/22/2017  . FOOT EXAM  02/21/2018  . OPHTHALMOLOGY EXAM  08/26/2019  . HEMOGLOBIN A1C  06/16/2020  . URINE MICROALBUMIN  12/16/2020  . TETANUS/TDAP  11/23/2026  . COLONOSCOPY (Pts 45-76yrs Insurance coverage will need to be confirmed)  02/14/2028  . COVID-19 Vaccine  Completed  . Hepatitis C Screening  Completed  . INFLUENZA VACCINE  Discontinued   Colorectal cancer screening: Type of screening: Colonoscopy. Completed 02/13/18. Repeat every 10 years  Lung Cancer Screening: (Low Dose CT Chest recommended if Age 36-80 years, 30 pack-year currently smoking OR have quit w/in 15years.) does not qualify.   Hepatitis C Screening: Completed 11/29/16.   Dental Screening: Recommended annual dental exams for proper oral hygiene  Community Resource Referral / Chronic Care Management: CRR required this visit?  No   CCM required this visit?  No      Plan:   Keep all  routine maintenance appointments.   Next scheduled non fasting lab 02/24/20 and fasting lab 04/19/20  Follow up 04/21/20 @ 2:00  I have personally reviewed and noted the following in the patient's chart:   . Medical and social history . Use of alcohol, tobacco or illicit drugs  . Current medications and supplements . Functional ability and status . Nutritional status . Physical activity . Advanced directives . List of other physicians . Hospitalizations, surgeries, and ER visits in previous 12  months . Vitals . Screenings to include cognitive, depression, and falls . Referrals and appointments  In addition, I have reviewed and discussed with patient certain preventive protocols, quality metrics, and best practice recommendations. A written personalized care plan for preventive services as well as general preventive health recommendations were provided to patient via mychart.     Varney Biles, LPN   [id number]

## 2020-02-15 NOTE — Patient Instructions (Addendum)
[name] , Thank you for taking time to come for your Medicare Wellness Visit. I appreciate your ongoing commitment to your health goals. Please review the following plan we discussed and let me know if I can assist you in the future.   These are the goals we discussed: Goals    . Follow up with Primary Care Provider     As needed       This is a list of the screening recommended for you and due dates:  Health Maintenance  Topic Date Due  . Pneumonia vaccines (2 of 2 - PCV13) 11/22/2017  . Complete foot exam   02/21/2018  . Eye exam for diabetics  08/26/2019  . Hemoglobin A1C  06/16/2020  . Urine Protein Check  12/16/2020  . Tetanus Vaccine  11/23/2026  . Colon Cancer Screening  02/14/2028  . COVID-19 Vaccine  Completed  .  Hepatitis C: One time screening is recommended by Center for Disease Control  (CDC) for  adults born from [birth year] through 1967.   Completed  . Flu Shot  Discontinued    Immunizations Immunization History  Administered Date(s) Administered  . Janssen (J&J) SARS-COV-2 Vaccination 05/25/2019  . Moderna Sars-Covid-2 Vaccination 11/08/2019  . Pneumococcal Polysaccharide-23 11/22/2016  . Td 11/22/2016   Keep all routine maintenance appointments.   Next scheduled non fasting lab 02/24/20 and fasting lab 04/19/20  Follow up 04/21/20 @ 2:00  Advanced directives: End of life planning; Advance aging; Advanced directives discussed.  Copy of current HCPOA/Living Will requested.    Conditions/risks identified: none new  Follow up in one year for your annual wellness visit.   Preventive Care 8 Years and Older, Male Preventive care refers to lifestyle choices and visits with your health care provider that can promote health and wellness. What does preventive care include?  A yearly physical exam. This is also called an annual well check.  Dental exams once or twice a year.  Routine eye exams. Ask your health care provider how often you should have your eyes  checked.  Personal lifestyle choices, including:  Daily care of your teeth and gums.  Regular physical activity.  Eating a healthy diet.  Avoiding tobacco and drug use.  Limiting alcohol use.  Practicing safe sex.  Taking low doses of aspirin every day.  Taking vitamin and mineral supplements as recommended by your health care provider. What happens during an annual well check? The services and screenings done by your health care provider during your annual well check will depend on your age, overall health, lifestyle risk factors, and family history of disease. Counseling  Your health care provider may ask you questions about your:  Alcohol use.  Tobacco use.  Drug use.  Emotional well-being.  Home and relationship well-being.  Sexual activity.  Eating habits.  History of falls.  Memory and ability to understand (cognition).  Work and work Statistician. Screening  You may have the following tests or measurements:  Height, weight, and BMI.  Blood pressure.  Lipid and cholesterol levels. These may be checked every 5 years, or more frequently if you are over 20 years old.  Skin check.  Lung cancer screening. You may have this screening every year starting at age 63 if you have a 30-pack-year history of smoking and currently smoke or have quit within the past 15 years.  Fecal occult blood test (FOBT) of the stool. You may have this test every year starting at age 50.  Flexible sigmoidoscopy or colonoscopy. You  may have a sigmoidoscopy every 5 years or a colonoscopy every 10 years starting at age 62.  Prostate cancer screening. Recommendations will vary depending on your family history and other risks.  Hepatitis C blood test.  Hepatitis B blood test.  Sexually transmitted disease (STD) testing.  Diabetes screening. This is done by checking your blood sugar (glucose) after you have not eaten for a while (fasting). You may have this done every 1-3  years.  Abdominal aortic aneurysm (AAA) screening. You may need this if you are a current or former smoker.  Osteoporosis. You may be screened starting at age 69 if you are at high risk. Talk with your health care provider about your test results, treatment options, and if necessary, the need for more tests. Vaccines  Your health care provider may recommend certain vaccines, such as:  Influenza vaccine. This is recommended every year.  Tetanus, diphtheria, and acellular pertussis (Tdap, Td) vaccine. You may need a Td booster every 10 years.  Zoster vaccine. You may need this after age 65.  Pneumococcal 13-valent conjugate (PCV13) vaccine. One dose is recommended after age 34.  Pneumococcal polysaccharide (PPSV23) vaccine. One dose is recommended after age 8. Talk to your health care provider about which screenings and vaccines you need and how often you need them. This information is not intended to replace advice given to you by your health care provider. Make sure you discuss any questions you have with your health care provider. Document Released: 01/27/2015 Document Revised: 09/20/2015 Document Reviewed: 11/01/2014 Elsevier Interactive Patient Education  2017 [place] Prevention in the Home Falls can cause injuries. They can happen to people of all ages. There are many things you can do to make your home safe and to help prevent falls. What can I do on the outside of my home?  Regularly fix the edges of walkways and driveways and fix any cracks.  Remove anything that might make you trip as you walk through a door, such as a raised step or threshold.  Trim any bushes or trees on the path to your home.  Use bright outdoor lighting.  Clear any walking paths of anything that might make someone trip, such as rocks or tools.  Regularly check to see if handrails are loose or broken. Make sure that both sides of any steps have handrails.  Any raised decks and porches  should have guardrails on the edges.  Have any leaves, snow, or ice cleared regularly.  Use sand or salt on walking paths during winter.  Clean up any spills in your garage right away. This includes oil or grease spills. What can I do in the bathroom?  Use night lights.  Install grab bars by the toilet and in the tub and shower. Do not use towel bars as grab bars.  Use non-skid mats or decals in the tub or shower.  If you need to sit down in the shower, use a plastic, non-slip stool.  Keep the floor dry. Clean up any water that spills on the floor as soon as it happens.  Remove soap buildup in the tub or shower regularly.  Attach bath mats securely with double-sided non-slip rug tape.  Do not have throw rugs and other things on the floor that can make you trip. What can I do in the bedroom?  Use night lights.  Make sure that you have a light by your bed that is easy to reach.  Do not use any sheets or  blankets that are too big for your bed. They should not hang down onto the floor.  Have a firm chair that has side arms. You can use this for support while you get dressed.  Do not have throw rugs and other things on the floor that can make you trip. What can I do in the kitchen?  Clean up any spills right away.  Avoid walking on wet floors.  Keep items that you use a lot in easy-to-reach places.  If you need to reach something above you, use a strong step stool that has a grab bar.  Keep electrical cords out of the way.  Do not use floor polish or wax that makes floors slippery. If you must use wax, use non-skid floor wax.  Do not have throw rugs and other things on the floor that can make you trip. What can I do with my stairs?  Do not leave any items on the stairs.  Make sure that there are handrails on both sides of the stairs and use them. Fix handrails that are broken or loose. Make sure that handrails are as long as the stairways.  Check any carpeting to  make sure that it is firmly attached to the stairs. Fix any carpet that is loose or worn.  Avoid having throw rugs at the top or bottom of the stairs. If you do have throw rugs, attach them to the floor with carpet tape.  Make sure that you have a light switch at the top of the stairs and the bottom of the stairs. If you do not have them, ask someone to add them for you. What else can I do to help prevent falls?  Wear shoes that:  Do not have high heels.  Have rubber bottoms.  Are comfortable and fit you well.  Are closed at the toe. Do not wear sandals.  If you use a stepladder:  Make sure that it is fully opened. Do not climb a closed stepladder.  Make sure that both sides of the stepladder are locked into place.  Ask someone to hold it for you, if possible.  Clearly mark and make sure that you can see:  Any grab bars or handrails.  First and last steps.  Where the edge of each step is.  Use tools that help you move around (mobility aids) if they are needed. These include:  Canes.  Walkers.  Scooters.  Crutches.  Turn on the lights when you go into a dark area. Replace any light bulbs as soon as they burn out.  Set up your furniture so you have a clear path. Avoid moving your furniture around.  If any of your floors are uneven, fix them.  If there are any pets around you, be aware of where they are.  Review your medicines with your doctor. Some medicines can make you feel dizzy. This can increase your chance of falling. Ask your doctor what other things that you can do to help prevent falls. This information is not intended to replace advice given to you by your health care provider. Make sure you discuss any questions you have with your health care provider. Document Released: 10/27/2008 Document Revised: 06/08/2015 Document Reviewed: 02/04/2014 Elsevier Interactive Patient Education  2017 [employer].

## 2020-02-18 DIAGNOSIS — K449 Diaphragmatic hernia without obstruction or gangrene: Secondary | ICD-10-CM | POA: Diagnosis not present

## 2020-02-18 DIAGNOSIS — K2289 Other specified disease of esophagus: Secondary | ICD-10-CM | POA: Diagnosis not present

## 2020-02-18 DIAGNOSIS — K222 Esophageal obstruction: Secondary | ICD-10-CM | POA: Diagnosis not present

## 2020-02-18 DIAGNOSIS — E119 Type 2 diabetes mellitus without complications: Secondary | ICD-10-CM | POA: Diagnosis not present

## 2020-02-24 ENCOUNTER — Other Ambulatory Visit: Payer: Self-pay | Admitting: Internal Medicine

## 2020-02-24 ENCOUNTER — Other Ambulatory Visit (INDEPENDENT_AMBULATORY_CARE_PROVIDER_SITE_OTHER): Payer: Medicare Other

## 2020-02-24 ENCOUNTER — Other Ambulatory Visit: Payer: Self-pay

## 2020-02-24 ENCOUNTER — Encounter: Payer: Self-pay | Admitting: Internal Medicine

## 2020-02-24 DIAGNOSIS — D649 Anemia, unspecified: Secondary | ICD-10-CM | POA: Diagnosis not present

## 2020-02-25 ENCOUNTER — Other Ambulatory Visit: Payer: Self-pay

## 2020-02-25 LAB — CBC WITH DIFFERENTIAL/PLATELET
Basophils Absolute: 0.1 10*3/uL (ref 0.0–0.1)
Basophils Relative: 1.3 % (ref 0.0–3.0)
Eosinophils Absolute: 0.3 10*3/uL (ref 0.0–0.7)
Eosinophils Relative: 4.4 % (ref 0.0–5.0)
HCT: 37.5 % — ABNORMAL LOW (ref 39.0–52.0)
Hemoglobin: 12.6 g/dL — ABNORMAL LOW (ref 13.0–17.0)
Lymphocytes Relative: 21.6 % (ref 12.0–46.0)
Lymphs Abs: 1.4 10*3/uL (ref 0.7–4.0)
MCHC: 33.6 g/dL (ref 30.0–36.0)
MCV: 85.3 fl (ref 78.0–100.0)
Monocytes Absolute: 0.8 10*3/uL (ref 0.1–1.0)
Monocytes Relative: 12.2 % — ABNORMAL HIGH (ref 3.0–12.0)
Neutro Abs: 4.1 10*3/uL (ref 1.4–7.7)
Neutrophils Relative %: 60.5 % (ref 43.0–77.0)
Platelets: 192 10*3/uL (ref 150.0–400.0)
RBC: 4.4 Mil/uL (ref 4.22–5.81)
RDW: 17.8 % — ABNORMAL HIGH (ref 11.5–15.5)
WBC: 6.7 10*3/uL (ref 4.0–10.5)

## 2020-02-25 LAB — IBC + FERRITIN
Ferritin: 9.5 ng/mL — ABNORMAL LOW (ref 22.0–322.0)
Iron: 46 ug/dL (ref 42–165)
Saturation Ratios: 11.6 % — ABNORMAL LOW (ref 20.0–50.0)
Transferrin: 284 mg/dL (ref 212.0–360.0)

## 2020-02-25 MED ORDER — CYANOCOBALAMIN 1000 MCG/ML IJ SOLN
1000.0000 ug | INTRAMUSCULAR | 1 refills | Status: DC
Start: 2020-02-25 — End: 2021-11-06

## 2020-03-20 ENCOUNTER — Ambulatory Visit: Payer: Medicare Other | Admitting: Physician Assistant

## 2020-03-20 ENCOUNTER — Other Ambulatory Visit
Admission: RE | Admit: 2020-03-20 | Discharge: 2020-03-20 | Disposition: A | Discharge status: Home / Self Care | Payer: Medicare Other | Source: Ambulatory Visit | Attending: Physician Assistant | Admitting: Physician Assistant

## 2020-03-20 ENCOUNTER — Other Ambulatory Visit: Payer: Self-pay

## 2020-03-20 ENCOUNTER — Encounter: Payer: Self-pay | Admitting: Physician Assistant

## 2020-03-20 ENCOUNTER — Telehealth: Payer: Self-pay | Admitting: Cardiovascular Disease

## 2020-03-20 VITALS — BP 152/84 | HR 92 | Ht 68.5 in | Wt 183.0 lb

## 2020-03-20 DIAGNOSIS — I779 Disorder of arteries and arterioles, unspecified: Secondary | ICD-10-CM

## 2020-03-20 DIAGNOSIS — M549 Dorsalgia, unspecified: Secondary | ICD-10-CM

## 2020-03-20 DIAGNOSIS — R079 Chest pain, unspecified: Secondary | ICD-10-CM | POA: Diagnosis not present

## 2020-03-20 DIAGNOSIS — E119 Type 2 diabetes mellitus without complications: Secondary | ICD-10-CM | POA: Diagnosis not present

## 2020-03-20 DIAGNOSIS — K219 Gastro-esophageal reflux disease without esophagitis: Secondary | ICD-10-CM | POA: Diagnosis not present

## 2020-03-20 DIAGNOSIS — E785 Hyperlipidemia, unspecified: Secondary | ICD-10-CM | POA: Diagnosis not present

## 2020-03-20 DIAGNOSIS — K222 Esophageal obstruction: Secondary | ICD-10-CM | POA: Diagnosis not present

## 2020-03-20 DIAGNOSIS — G4733 Obstructive sleep apnea (adult) (pediatric): Secondary | ICD-10-CM

## 2020-03-20 DIAGNOSIS — I2510 Atherosclerotic heart disease of native coronary artery without angina pectoris: Secondary | ICD-10-CM

## 2020-03-20 DIAGNOSIS — I10 Essential (primary) hypertension: Secondary | ICD-10-CM

## 2020-03-20 LAB — BASIC METABOLIC PANEL
Anion gap: 8 (ref 5–15)
BUN: 13 mg/dL (ref 8–23)
CO2: 26 mmol/L (ref 22–32)
Calcium: 9.1 mg/dL (ref 8.9–10.3)
Chloride: 106 mmol/L (ref 98–111)
Creatinine, Ser: 0.92 mg/dL (ref 0.61–1.24)
GFR, Estimated: >60 mL/min (ref >60)
Glucose, Bld: 133 mg/dL — ABNORMAL HIGH (ref 70–99)
Potassium: 3.6 mmol/L (ref 3.5–5.1)
Sodium: 140 mmol/L (ref 135–145)

## 2020-03-20 LAB — CBC
HCT: 37.7 % — ABNORMAL LOW (ref 39.0–52.0)
Hemoglobin: 12.4 g/dL — ABNORMAL LOW (ref 13.0–17.0)
MCH: 28.3 pg (ref 26.0–34.0)
MCHC: 32.9 g/dL (ref 30.0–36.0)
MCV: 86.1 fL (ref 80.0–100.0)
Platelets: 200 10*3/uL (ref 150–400)
RBC: 4.38 MIL/uL (ref 4.22–5.81)
RDW: 15.8 % — ABNORMAL HIGH (ref 11.5–15.5)
WBC: 5.9 10*3/uL (ref 4.0–10.5)
nRBC: 0 % (ref 0.0–0.2)

## 2020-03-20 LAB — BRAIN NATRIURETIC PEPTIDE: B Natriuretic Peptide: 18.9 pg/mL (ref 0.0–100.0)

## 2020-03-20 MED ORDER — ISOSORBIDE MONONITRATE ER 60 MG PO TB24: 60.0000 mg | ORAL_TABLET | Freq: Every day | ORAL | 3 refills | Status: DC

## 2020-03-20 NOTE — Progress Notes (Unsigned)
Office Visit    Patient Name: Shane Mcknight Date of Encounter: 03/20/2020  PCP:  Einar Pheasant, [place]  Cardiologist:  Kathlyn Sacramento, MD  Advanced Practice Provider:  No care team member to display Electrophysiologist:  None   Chief Complaint    Chief Complaint  Patient presents with  . Other    Patient c/o increased Episodes of chest pressure. Meds reviewed verbally with patient.     70 yo with history of CAD s/p RCA stenting in June 2018, carotid artery dz, HTN, HLD, DM2, OSA, GERD, hiatal hernia, and esophageal stricture, who presents for add on visit for CP.  Past Medical History    Past Medical History:  Diagnosis Date  . Abnormal liver function tests   . Allergic state   . Anemia   . Arthritis    spine, hands, shoulder with previous cuff tear  . CAD (coronary artery disease)    a.  11/2015 CT Chest/Abd: Coronary Ca2+ noted;  b. MV: EF 45-54%, med defect of mod severity in basal infsept, basal inf, mid infsept, and mid inf region-> infarct and peri-infarct ischemia-->intermediate risk; c. 05/2016 Cath/PCI: LM 40ost, LAD 30p, 69m, D1 60ost, LCX 60ost, OM1 99 (small->Med Rx), RCA 60p, 95d (2.5x15 Resolute Onyx DES), RPDA/RPLB min irregs, D2 60, EF 55-65%/  . Candidiasis of esophagus ([place])   . Colon polyp   . DDD (degenerative disc disease), lumbosacral   . Dysphagia   . Elevated transaminase level   . Esophageal stricture    a. s/p multiple dilations - last 03/2016.  Marland Kitchen Esophagitis, [place] grade D   . Ganglion cyst of finger of right hand   . GERD (gastroesophageal reflux disease)   . Guaiac positive stools   . Hiatal hernia   . HNP (herniated nucleus pulposus), lumbar   . Hypercholesterolemia   . Hypertension   . Lumbar radiculitis   . Mallory-Weiss tear    a. 11/2015.  . Migraine aura without headache    migraine, visual  . Psoriasis    with psoriatic arthritis  . Psoriatic arthritis (HCC)    severe  . Schatzki's  ring   . Sleep apnea    does not uses CPAP. has lost weight so is much better.  . Type II diabetes mellitus ([place])    Past Surgical History:  Procedure Laterality Date  . ARTHROPLASTY     right thumb; left thumb with 2 screws and plastic joint  . BALLOON DILATION N/A 05/29/2015   Procedure: BALLOON DILATION;  Surgeon: Lollie Sails, MD;  Location: [facility] ENDOSCOPY;  Service: Endoscopy;  Laterality: N/A;  . BALLOON DILATION N/A 02/14/2017   Procedure: BALLOON DILATION;  Surgeon: Lollie Sails, MD;  Location: [facility] ENDOSCOPY;  Service: Endoscopy;  Laterality: N/A;  . BILATERAL CARPAL TUNNEL RELEASE Bilateral 07/04/2017   Procedure: BILATERAL CARPAL TUNNEL RELEASE;  Surgeon: Leanor Kail, MD;  Location: [facility] ORS;  Service: Orthopedics;  Laterality: Bilateral;  . BLEPHAROPLASTY    . CARDIAC CATHETERIZATION     1 stent   . CARPAL TUNNEL RELEASE  2005   right  . CHOLECYSTECTOMY  2008  . COLONOSCOPY    . COLONOSCOPY WITH PROPOFOL N/A 02/13/2018   Procedure: COLONOSCOPY WITH PROPOFOL;  Surgeon: Lollie Sails, MD;  Location: [facility] ENDOSCOPY;  Service: Endoscopy;  Laterality: N/A;  . CORONARY STENT INTERVENTION N/A 06/14/2016   Procedure: Coronary Stent Intervention;  Surgeon: Wellington Hampshire, MD;  Location: [facility] [address]  INVASIVE CV LAB;  Service: Cardiovascular;  Laterality: N/A;  . CYST EXCISION  04/14/2015   tendon sheath cyst excision; right ring finger cyst removed and trigger finger realease  . ESOPHAGOGASTRODUODENOSCOPY    . ESOPHAGOGASTRODUODENOSCOPY (EGD) WITH PROPOFOL N/A 05/29/2015   Procedure: ESOPHAGOGASTRODUODENOSCOPY (EGD) WITH PROPOFOL;  Surgeon: Lollie Sails, MD;  Location: [facility] ENDOSCOPY;  Service: Endoscopy;  Laterality: N/A;  . ESOPHAGOGASTRODUODENOSCOPY (EGD) WITH PROPOFOL N/A 07/04/2015   Procedure: ESOPHAGOGASTRODUODENOSCOPY (EGD) WITH PROPOFOL;  Surgeon: Lollie Sails, MD;  Location: [facility] ENDOSCOPY;  Service: Endoscopy;  Laterality: N/A;  .  ESOPHAGOGASTRODUODENOSCOPY (EGD) WITH PROPOFOL N/A 01/04/2016   Procedure: ESOPHAGOGASTRODUODENOSCOPY (EGD) WITH PROPOFOL;  Surgeon: Lollie Sails, MD;  Location: [facility] ENDOSCOPY;  Service: Endoscopy;  Laterality: N/A;  . ESOPHAGOGASTRODUODENOSCOPY (EGD) WITH PROPOFOL N/A 03/22/2016   Procedure: ESOPHAGOGASTRODUODENOSCOPY (EGD) WITH PROPOFOL;  Surgeon: Lollie Sails, MD;  Location: [facility] ENDOSCOPY;  Service: Endoscopy;  Laterality: N/A;  . ESOPHAGOGASTRODUODENOSCOPY (EGD) WITH PROPOFOL N/A 02/14/2017   Procedure: ESOPHAGOGASTRODUODENOSCOPY (EGD) WITH PROPOFOL;  Surgeon: Lollie Sails, MD;  Location: [facility] ENDOSCOPY;  Service: Endoscopy;  Laterality: N/A;  . ESOPHAGOGASTRODUODENOSCOPY (EGD) WITH PROPOFOL N/A 04/11/2017   Procedure: ESOPHAGOGASTRODUODENOSCOPY (EGD) WITH PROPOFOL;  Surgeon: Lollie Sails, MD;  Location: [facility] ENDOSCOPY;  Service: Endoscopy;  Laterality: N/A;  . ESOPHAGOGASTRODUODENOSCOPY (EGD) WITH PROPOFOL N/A 07/07/2017   Procedure: ESOPHAGOGASTRODUODENOSCOPY (EGD) WITH PROPOFOL;  Surgeon: Lucilla Lame, MD;  Location: [facility] ENDOSCOPY;  Service: Endoscopy;  Laterality: N/A;  . ESOPHAGOGASTRODUODENOSCOPY (EGD) WITH PROPOFOL N/A 02/13/2018   Procedure: ESOPHAGOGASTRODUODENOSCOPY (EGD) WITH PROPOFOL;  Surgeon: Lollie Sails, MD;  Location: [facility] ENDOSCOPY;  Service: Endoscopy;  Laterality: N/A;  . EYE SURGERY    . FOREIGN BODY REMOVAL N/A 12/03/2015   Procedure: FOREIGN BODY REMOVAL;  Surgeon: Wilford Corner, MD;  Location: [facility] ENDOSCOPY;  Service: Endoscopy;  Laterality: N/A;  . HERNIA REPAIR Right 1970   inguinal  . INGUINAL HERNIA REPAIR Left 01/01/2018   Procedure: OPEN HERNIA REPAIR INGUINAL ADULT;  Surgeon: Olean Ree, MD;  Location: [facility] ORS;  Service: General;  Laterality: Left;  . LEFT HEART CATH Left 06/14/2016   Procedure: Left Heart Cath;  Surgeon: Wellington Hampshire, MD;  Location: [place] CV LAB;  Service: Cardiovascular;  Laterality: Left;  . LEFT  HEART CATH AND CORONARY ANGIOGRAPHY Left 09/14/2018   Procedure: LEFT HEART CATH AND CORONARY ANGIOGRAPHY;  Surgeon: Wellington Hampshire, MD;  Location: [place] CV LAB;  Service: Cardiovascular;  Laterality: Left;  . LUMBAR DISC SURGERY  2004   injections; herniated disc; percutaneous discectomy  . NASAL SINUS SURGERY  2008  . percutaneous lumbar discectomy    . ROTATOR CUFF REPAIR Right   . TONSILLECTOMY      Allergies  Allergies  Allergen Reactions  . Ephedrine Other (See Comments)    Causing prostate to hurt. Using for sinus bronchodilators Causing prostate to hurt. Using for sinus bronchodilators Pain in groin, prostate  . Nsaids Other (See Comments)    GI upset, history of esophagitis and Mallory-Weis tear  . Other Rash, Other (See Comments) and Nausea Only    Paper tape causes rash Plastic tape is okay GI upset, history of esophagitis and Mallory-Weis tear  . Vancomycin Itching and Rash    Same as pcn with itching  . Amoxicillin Itching and Rash  . Hyoscyamine Sulfate Other (See Comments)    Urinary retention Urine retention  . Levsin [Hyoscyamine Sulfate] Other (See Comments)    Urinary retention  . Naproxen Other (  See Comments)    GI upset Also happens with other NSAIDS  GI upset Also happens with other NSAIDS GI upset  . Penicillins Itching and Rash    Has patient had a PCN reaction causing immediate rash, facial/tongue/throat swelling, SOB or lightheadedness with hypotension: yes Has patient had a PCN reaction causing severe rash involving mucus membranes or skin necrosis: no Has patient had a PCN reaction that required hospitalization: no Has patient had a PCN reaction occurring within the last 10 years: no If all of the above answers are "NO", then may proceed with Cephalosporin use.   . Shellfish Allergy Rash    Ingested shellfish cCuses rash and itching along with stomach sickness. Pt tolerates betadine   . Sudafed [Pseudoephedrine Hcl] Other (See  Comments)    Constricts prostate flow (from ephedrine d/t sinus meds)    History of Present Illness    CLARANCE BOLLARD is a 70 y.o. male with PMH as above.    He had an abnormal stress test 05/2016, followed by cardiac catheterization revealing severe RCA dz and treated with DES. He also has medically managed severe obtuse marginal disease.   In August 2020, he started experiencing exertional substernal chest heaviness associated with fatigue, weakness, and mild lightheadedness, occurring with relatively minimal activity, lasting up to about 10 minutes, and resolving with rest.  He noted that his symptoms were similar to what he experienced prior to stenting in 2018 and therefore pursue diagnostic catheterization. Of note, his BP would drop during these episodes, which is the reason long-acting nitrates have been deferred in the past. He also typically will wait to take his SL nitro for chest tightness until the nighttime, as he gets lightheadedness after taking SL nitro.  09/14/2018 cardiac cath showed patent distal RCA stent with no significant restenosis. Stable moderate disease in the mid LAD and proximal right coronary artery.  OM1 now completely occluded.  It was 99% stenosed before but overall a small branch.  The only difference noted from most recent angiography was progression of distal RCA disease just proximal to the stent and a tortuous segment causing about 60% stenosis.  It was also noted that he had normal LV systolic function with mildly elevated left ventricular end-diastolic pressure.    No clear culprit was identified for his anginal symptoms despite known moderate three-vessel coronary artery disease.  Imdur 30 mg once daily was added and HCTZ discontinued to avoid hypotension.  It was noted that stress testing could be pursued if his anginal symptoms continued despite optimization of antianginal medication to see if he is ischemic in the RCA or LAD distribution.  Imdur resulted in  resolution of previous CP and Losartan dose reduced due to hypotension. He was last seen 11/2019 by Dr. Fletcher Anon doing well from cardiac perspective though with chronic back pain undergoing steroid injections. No changes were made and he was recommended for 6 month follow up.   He called the office 03/20/20 and reported an episode of CP Friday 3/4 evening, as well as a second episode Saturday 3/5 night. Both episodes resolved with nitroglycerin x1. He reported compliance with Imdur 30mg  QD. He was scheduled for same day visit 03/20/2020.   In clinic today, he reports he initially called the office to schedule his follow-up visit however, during this call, he was asked if he had any chest discomfort.  He then reported that he had recently chest pain, which was new for him since his previous cath and start of Imdur  above.  Did not find the chest pain concerning, but he was advised to come in same day.  During his visit, he reports that he has had substantial neck and shoulder pain, as well as ongoing back pain, and he wonders if this is contributing.  He used to be a Midwife, and he has reportedly suffered a lot of musculoskeletal pain as a result of this past hobby as well as past surgery to his back.  He reports frequently hurting in his neck and shoulder.  On Friday (03/17/20) night, he was watching television when he experienced chest tightness that occurred at approximately 9 PM and lasted until 12 PM.  He took a sublingual nitro before he went to bed.  He slept through the night without waking to any chest pain.  When he awoke in the morning, the pain was gone.  His second episode occurred that evening (3/5, Saturday at 11PM). Earlier that day, he was making a water catching system with his son, but he was not exerting himself. His son was doing all the exertional activities.  Nevertheless, he had a repeat episode of chest tightness that evening at 11PM.  He reports that the pain again lasted until  he took a sublingual nitro at 1AM and before bed.  He did check his blood pressure Saturday night and reported BP 148/40, which is elevated for him.  Home  SBP reported as usually 130s and DBP 60s to 70s.  He reports heart rate is normally in the 60s to 70s.  The pain was gone when he awoke in the morning on Sunday 3/6 and has not recurred.  He is uncertain of how long each episode lasted, since he went to sleep after taking nitro each time. He reports eating dinner each night around 5PM or 6PM and does not feel the sx are 2/2 reflux.  Symptoms did not feel similar to that of musculoskeletal pain when pressing on them, despite his history of MSK pain.  He is uncertain if they were positional or pleuritic.  He does note that his left back and shoulder, as well as his left neck were hurting at the same time.  No associated symptoms.  He denies any racing heart rate or palpitations.  No presyncope or syncope.  No signs or symptoms of volume overload.  He reports drinking only 1 cup of coffee daily.  He tries to eat healthy and watches his food/salt/fluid intake. On walking into the office today, he denies any exertional symptoms.  As above, he has not had any symptoms since his last episode on Saturday 3/5.  Today, 03/20/2020, his BP and heart rate is noted to be elevated, which she attributes to current back and shoulder pain.  He reports that he has this pain frequently, and it usually corresponds with elevated heart rate and BP that normalized with relief of pain.   Home Medications    Current Outpatient Medications on File Prior to Visit  Medication Sig Dispense Refill  . acetaminophen (TYLENOL) 500 MG tablet Take 1,000 mg by mouth daily as needed for moderate pain.    Marland Kitchen aspirin EC 81 MG tablet Take 81 mg by mouth every evening.     Marland Kitchen atenolol (TENORMIN) 50 MG tablet TAKE 1 TABLET BY MOUTH  TWICE DAILY 180 tablet 3  . COSENTYX SENSOREADY PEN 150 MG/ML SOAJ Inject 300 mg into the skin every 30 (thirty) days.    10  . cyanocobalamin (,VITAMIN B-12,) 1000 MCG/ML injection Inject  1 mL (1,000 mcg total) into the muscle every 30 (thirty) days. 10 mL 1  . folic acid (FOLVITE) 1 MG tablet Take 1 mg by mouth every evening.     . gabapentin (NEURONTIN) 300 MG capsule TAKE 1 TO 2 CAPSULES BY  MOUTH AT BEDTIME 180 capsule 3  . isosorbide mononitrate (IMDUR) 30 MG 24 hr tablet TAKE 1 TABLET BY MOUTH  DAILY 90 tablet 1  . losartan (COZAAR) 25 MG tablet Take 0.5 tablets (12.5 mg total) by mouth daily. 45 tablet 3  . metFORMIN (GLUCOPHAGE) 500 MG tablet TAKE 2 TABLETS BY MOUTH  TWICE DAILY WITH A MEAL 360 tablet 3  . methotrexate (RHEUMATREX) 2.5 MG tablet Take 8 tablets (20 mg total) by mouth once a week. Take 8 tablets every Friday. 8 tablet 0  . Multiple Vitamin (MULTIVITAMIN) tablet Take 1 tablet by mouth daily.    . nitroGLYCERIN (NITROSTAT) 0.4 MG SL tablet Place 1 tablet (0.4 mg total) under the tongue every 5 (five) minutes as needed. MAXIMUM OF 3 DOSES. 25 tablet 6  . pantoprazole (PROTONIX) 40 MG tablet TAKE 1 TABLET BY MOUTH  TWICE DAILY 180 tablet 3  . rosuvastatin (CRESTOR) 10 MG tablet Take half tablet (5 MG) by mouth daily    . SYRINGE-NEEDLE, DISP, 3 ML 25G X 5/8" 3 ML MISC Use as instructed with B12 injection. 50 each 11  . triamcinolone cream (KENALOG) 0.5 % Apply 1 application topically 2 (two) times daily as needed (scaling).    . vitamin E 400 UNIT capsule Take 400 Units by mouth every evening.     No current facility-administered medications on file prior to visit.    Review of Systems    He reports CP, as well as back and shoulder pain as described above. He denies palpitations, dyspnea, pnd, orthopnea, n, v, dizziness, syncope, edema, weight gain, or early satiety.   All other systems reviewed and are otherwise negative except as noted above.  Physical Exam    VS:  BP (!) 152/84 (BP Location: Left Arm, Patient Position: Sitting, Cuff Size: Normal)   Pulse 92   Ht 5' 8.5" (1.74 m)   Wt  183 lb (83 kg)   SpO2 98%   BMI 27.42 kg/m  , BMI Body mass index is 27.42 kg/m. GEN: Well nourished, well developed, in no acute distress. HEENT: normal. Neck: Supple, no JVD, carotid bruits, or masses. Cardiac: RRR, no murmurs, rubs, or gallops. No clubbing, cyanosis, edema.  Radials/DP/PT 2+ and equal bilaterally.  Respiratory:  Respirations regular and unlabored, clear to auscultation bilaterally. GI: Soft, nontender, nondistended, BS + x 4. MS: no deformity or atrophy. Skin: warm and dry, no rash. Neuro:  Strength and sensation are intact. Psych: Normal affect.  Accessory Clinical Findings    ECG personally reviewed by me today - NSR, 92bpm,left axis deviation, nonspecific ST/T changes,III, aVF, poor R wave progression V1- no acute changes.  VITALS Reviewed today   Temp Readings from Last 3 Encounters:  12/24/19 98.5 F (36.9 C)  08/06/19 98.3 F (36.8 C)  05/07/19 (!) 97 F (36.1 C)   BP Readings from Last 3 Encounters:  03/20/20 (!) 152/84  12/25/19 122/62  11/19/19 (!) 160/82   Pulse Readings from Last 3 Encounters:  03/20/20 92  12/24/19 77  11/19/19 (!) 56    Wt Readings from Last 3 Encounters:  03/20/20 183 lb (83 kg)  02/15/20 183 lb (83 kg)  12/24/19 183 lb 3.2 oz (83.1 kg)  LABS  reviewed today   Lab Results  Component Value Date   WBC 6.7 02/24/2020   HGB 12.6 (L) 02/24/2020   HCT 37.5 (L) 02/24/2020   MCV 85.3 02/24/2020   PLT 192.0 02/24/2020   Lab Results  Component Value Date   CREATININE 0.83 12/17/2019   BUN 10 12/17/2019   NA 141 12/17/2019   K 3.8 12/17/2019   CL 104 12/17/2019   CO2 28 12/17/2019   Lab Results  Component Value Date   ALT 13 12/17/2019   AST 13 12/17/2019   ALKPHOS 42 12/17/2019   BILITOT 0.5 12/17/2019   Lab Results  Component Value Date   CHOL 117 12/17/2019   HDL 35.90 (L) 12/17/2019   LDLCALC 59 12/17/2019   TRIG 108.0 12/17/2019   CHOLHDL 3 12/17/2019    Lab Results  Component Value Date    HGBA1C 6.7 (H) 12/17/2019   Lab Results  Component Value Date   TSH 2.05 07/30/2019     STUDIES/PROCEDURES reviewed today   LHC 08/2018  Ost LM to LM lesion is 40% stenosed.  Mid LAD lesion is 50% stenosed.  Prox LAD lesion is 30% stenosed.  Ost 1st Diag to 1st Diag lesion is 60% stenosed.  Ost Cx lesion is 40% stenosed.  1st Mrg lesion is 100% stenosed.  Prox RCA to Mid RCA lesion is 60% stenosed.  Dist RCA-1 lesion is 60% stenosed.  Previously placed Dist RCA-2 stent (unknown type) is widely patent.  RPAV lesion is 50% stenosed.  The left ventricular systolic function is normal.  LV end diastolic pressure is mildly elevated.  The left ventricular ejection fraction is 55-65% by visual estimate. 1.  Patent distal RCA stent with no significant restenosis.  Stable moderate disease in the mid LAD and proximal right coronary artery.  OM1 is now completely occluded.  It was 99% stenosed before but overall a small branch.  The only difference from most recent angiography is progression of distal RCA disease just proximal to the stent in a tortuous segment causing about 60% stenosis. 2.  Normal LV systolic function mildly elevated left ventricular end-diastolic pressure. Recommendations: No clear culprit for patient's anginal symptoms although he does have known moderate three-vessel coronary artery disease.  I am going to add Imdur 30 mg once daily.  I discontinued hydrochlorothiazide to avoid hypotension. If the patient has refractory anginal symptoms in spite of optimizing his antianginal medications, stress testing can be considered to see if he is ischemic in the RCA or LAD distribution.   Carotid US 03/2019 Summary:  Right Carotid: Velocities in the right ICA are consistent with a 1-39%  stenosis.         Non-hemodynamically significant plaque <50% noted in the  CCA.         The ECA appears <50% stenosed.  Left Carotid: Velocities in the left ICA  are consistent with a 40-59%  stenosis.        Non-hemodynamically significant plaque <50% noted in the  CCA.        The ECA appears >50% stenosed.  Vertebrals: Bilateral vertebral arteries demonstrate antegrade flow.  Subclavians: Normal flow hemodynamics were seen in bilateral subclavian        arteries.  *See table(s) above for measurements and observations.  Suggest follow up study in 12 months.   Assessment & Plan    Coronary artery disease s/p RCA PCI 06/2016 --No current CP with 2 episodes of CP as above. CP somewhat atypical and  occurs at rest and not with exertion. Euvolemic on exam. S/p repeat 09/14/18 diagnostic cath demonstrating patent RCA stent with otherwise stable moderate disease and a tortuous RCA. Previously, CP has alleviated with Imdur, which he reports compliance with at this time. Denies that sx are consistent with those experienced 2/2 his esophageal stricture; however, with increased dose of Imdur, any pain elicited by his stricture will also be alleviated.  HR and BP elevated today, which he reports as a usual finding when he has neck and back pain.  Recommendations as directly below under essential hypertension.  Considered also is MSK etiology of pain, discussed today, and with continued recommendation to avoid NSAIDs. As above, CP atypical; however, given his known CAD, as well as previous cath report with recommendations as directly above, plan is to perform further studies for risk factor stratification at this time.   Ordered Echo to reassess EF, Smolenski motion, and rule out any acute structural abnormalities.    Ordered Lexiscan Myoview to evaluate for possible ischemia in the RCA or LAD territories.  Check BMET, CBC, BNP today.  Continue medical management with ASA and PPI.    No reported signs or symptoms of bleeding.  Denies CP 2/2 GI symptoms.  Continue current dose atenolol and losartan.    Given his elevated heart rate and pressure  today, we discussed increasing the doses of atenolol and losartan today. Per patient preference, we will defer any changes in doses, and he will monitor his BP and heart rate at home and call the office if they continue to be elevated once pain free.    Increased to Imdur 60 mg daily for antianginal effect.   Further recommendations pending studies as above.    Essential hypertension, BP goal below 130/80 --BP today suboptimal at 152/84.  He reports BP is often elevated when he is experiencing pain.  We discussed increasing the dose of his beta-blocker and losartan, given his room in heart rate and BP today, as well as given his previous 11/2019 visit with his primary cardiologist also noted elevated BP.  If BP is still elevated at RTC, recommend increasing the dose of losartan +/- atenolol if room in HR. For now, he will monitor at home and call the office if they remain elevated.   Hyperlipidemia, LDL goal below 70 --12/2019 LDL 59 and at goal with Tg 108.0.  --Continue current low dose rosuvastatin 5 mg daily.   DM2 --Glycemic control recommended for risk factor modification. --Consider Wilder Glade in the future and pending updated studies above.  GERD / Schatzki's ring / hiatal hernia/ esophageal stricture --Followed closely by GI with procedures ongoing for stretching his esophagus.  --Continue PPI.  --No reported s/sx of bleeding. --Denies that sx of CP consistent with that experienced by his stricture as above; however, increased dose Imdur should help with any pain 2/2 stricture. Will increase Imdur to 60mg  daily.  OSA --Recommended CPAP for risk factor modification.   Carotid dz /repeat imaging recommended --No reported s/sx of worsening carotid dz. Continue ASA, statin. Repeat study documented as scheduled for this month. If not yet obtained at RTC, ensure that order placed and not cancelled, given this was the recommendation based on his 03/2019 study as above.   Medication  changes: Increased to Imdur 60mg  qd Labs ordered: BMET, CBC, BNP Studies / Imaging ordered: Echo, MPI. Carotids already ordered for 1 year follow-up. Future considerations: Increased atenolol / losartan doses pending repeat vitals (bring his cuff to office if possible)  Disposition: RTC after studies   Arvil Chaco, PA-C 03/20/2020

## 2020-03-20 NOTE — Telephone Encounter (Signed)
Spoke with the patient. Patient sts that he has had 2 episodes of chest pressure while in bed. The first Friday night and the second last night. He described the chest discomfort as pressure it does not radiate.  He does have chronic back pain. He denies any associated symptoms. No sob, n/v, diaphoresis.  He did take a Nitro x1 both times and his symptoms improved. He is taking Imdur 30 mg daily as prescribed. He is not currently having chest pressure.  Adv the patient that I would recommend scheduling an appt. Appt scheduled for today at 3pm with Marrianne Mood, PA. I asked the patient to arrive 20 min prior to allow for check in.  Patient verbalized understanding and voiced appreciation for the assistance.

## 2020-03-20 NOTE — Telephone Encounter (Signed)
Pt c/o of Chest Pain: STAT if CP now or developed within 24 hours  1. Are you having CP right now? No, but last night had back pain   2. Are you experiencing any other symptoms (ex. SOB, nausea, vomiting, sweating)? Back pain  3. How long have you been experiencing CP? Since early last week  4. Is your CP continuous or coming and going? Comes and goes  5. Have you taken Nitroglycerin? Took Nitro on Friday and yesterday. ?

## 2020-03-20 NOTE — Patient Instructions (Addendum)
Medication Instructions:  Your physician has recommended you make the following change in your medication:   INCREASE Isosorbide to 60mg  DAILY   Lab Work:  Your physician recommends that you have lab work TODAY at the medical mall: BNP, Bmet, CBC -  Please go to the [facility]. You will check in at the front desk to the right as you walk into the atrium.   Testing/Procedures: Your physician has requested that you have an echocardiogram. Echocardiography is a painless test that uses sound waves to create images of your heart. It provides your doctor with information about the size and shape of your heart and how well your heart's chambers and valves are working. This procedure takes approximately one hour. There are no restrictions for this procedure.  [place]  Your caregiver has ordered a Stress Test with nuclear imaging. The purpose of this test is to evaluate the blood supply to your heart muscle. This procedure is referred to as a "Non-Invasive Stress Test." This is because other than having an IV started in your vein, nothing is inserted or "invades" your body. Cardiac stress tests are done to find areas of poor blood flow to the heart by determining the extent of coronary artery disease (CAD). Some patients exercise on a treadmill, which naturally increases the blood flow to your heart, while others who are  unable to walk on a treadmill due to physical limitations have a pharmacologic/chemical stress agent called Lexiscan . This medicine will mimic walking on a treadmill by temporarily increasing your coronary blood flow.   Please note: these test may take anywhere between 2-4 hours to complete  PLEASE REPORT TO [facility] AT THE FIRST DESK WILL DIRECT YOU WHERE TO GO  Date of Procedure:_____________________________________  Arrival Time for Procedure:______________________________  Instructions regarding medication:   __X__ : Hold  diabetes medication morning of procedure - Metformin  __X__:  Hold betablocker(s) night before procedure and morning of procedure - Atenolol   PLEASE NOTIFY THE OFFICE AT LEAST 24 HOURS IN ADVANCE IF YOU ARE UNABLE TO KEEP YOUR APPOINTMENT.  [phone] AND  PLEASE NOTIFY NUCLEAR MEDICINE AT [facility] AT LEAST 24 HOURS IN ADVANCE IF YOU ARE UNABLE TO KEEP YOUR APPOINTMENT. [phone]  How to prepare for your Myoview test:  1. Do not eat or drink after midnight 2. No caffeine for 24 hours prior to test 3. No smoking 24 hours prior to test. 4. Your medication may be taken with water.  If your doctor stopped a medication because of this test, do not take that medication. 5. Please wear a short sleeve shirt. 6. No perfume, cologne or lotion. 7. Wear comfortable walking shoes.    Follow-Up: At [facility], you and your health needs are our priority.  As part of our continuing mission to provide you with exceptional heart care, we have created designated Provider Care Teams.  These Care Teams include your primary Cardiologist (physician) and Advanced Practice Providers (APPs -  Physician Assistants and Nurse Practitioners) who all work together to provide you with the care you need, when you need it.  We recommend signing up for the patient portal called "MyChart".  Sign up information is provided on this After Visit Summary.  MyChart is used to connect with patients for Virtual Visits (Telemedicine).  Patients are able to view lab/test results, encounter notes, upcoming appointments, etc.  Non-urgent messages can be sent to your provider as well.   To learn more  about what you can do with MyChart, go to [email or web address].    Your next appointment:   2 week(s)  The format for your next appointment:   In Person  Provider:   You may see Kathlyn Sacramento, MD or one of the following Advanced Practice Providers on your designated Care Team:    Murray Hodgkins, NP  Christell Faith,  PA-C  Marrianne Mood, PA-C  Cadence [place], [place]  Laurann Montana, NP   BMET, BNP, CBC

## 2020-03-21 ENCOUNTER — Other Ambulatory Visit: Payer: Self-pay

## 2020-03-21 MED ORDER — ISOSORBIDE MONONITRATE ER 60 MG PO TB24: 60.0000 mg | ORAL_TABLET | Freq: Every day | ORAL | 0 refills | Status: DC

## 2020-03-21 NOTE — Telephone Encounter (Signed)
*  STAT* If patient is at the pharmacy, call can be transferred to refill team.   1. Which medications need to be refilled? (please list name of each medication and dose if known) Isosorbide  2. Which pharmacy/location (including street and city if local pharmacy) is medication to be sent to? [facility]  3. Do they need a 30 day or 90 day supply? [place]

## 2020-03-22 ENCOUNTER — Telehealth: Payer: Self-pay | Admitting: *Deleted

## 2020-03-22 MED ORDER — POTASSIUM CHLORIDE ER 20 MEQ PO TBCR: 20.0000 meq | EXTENDED_RELEASE_TABLET | Freq: Every day | ORAL | 0 refills | Status: DC

## 2020-03-22 NOTE — Telephone Encounter (Signed)
Pt returned call. Notified of labs and provider's recc.  Pt verbalized understanding. He will: Start potassium 38mEq daily x 2 days. - Rx sent to local pharmacy.  Follow up on 3/25 with repeat Bmet at visit.   Pt does confirm that he continues to take Losartan and does have refills with no issues.  He will continue to monitor BP's at home.  Pt has no further questions at this time.

## 2020-03-22 NOTE — Telephone Encounter (Signed)
Attempted to call pt to discuss lab results and provider's recc.  No answer. Lmtcb.  

## 2020-03-22 NOTE — Telephone Encounter (Signed)
-----   Message from Arvil Chaco, PA-C sent at 03/21/2020  4:45 PM EST ----- Labs show --Fluid lab does not suggest he is holding on to any volume, which is good news. --Kidney function stable. --Potasium on the low side at 3.6. --He is anemic, which is a chronic finding on review of previous labs.  Recommendations:  --Kcl tab 25mEq x2 days to replete potassium. Recheck of labs at RTC. #On review of meds list, his losartan is expired. Does he have refills for this? His BP was elevated during his visit, so we will want to be certain he is taking this medication. Recommend he continue to monitor BP at home.

## 2020-03-31 ENCOUNTER — Other Ambulatory Visit: Payer: Self-pay

## 2020-03-31 ENCOUNTER — Encounter
Admission: RE | Admit: 2020-03-31 | Discharge: 2020-03-31 | Disposition: A | Discharge status: Home / Self Care | Payer: Medicare Other | Source: Ambulatory Visit | Attending: Physician Assistant | Admitting: Physician Assistant

## 2020-03-31 DIAGNOSIS — R079 Chest pain, unspecified: Secondary | ICD-10-CM | POA: Diagnosis not present

## 2020-03-31 LAB — NM MYOCAR MULTI W/SPECT W/WALL MOTION / EF
LV dias vol: 78 mL (ref 62–150)
LV sys vol: 27 mL
Peak HR: 90 {beats}/min
Percent HR: 60 %
Rest HR: 52 {beats}/min
SDS: 1
SRS: 0
SSS: 1
TID: 1.26

## 2020-03-31 MED ORDER — TECHNETIUM TC 99M TETROFOSMIN IV KIT
10.0000 | PACK | Freq: Once | INTRAVENOUS | Status: AC | PRN
  Administered 2020-03-31: 10.43 via INTRAVENOUS

## 2020-03-31 MED ORDER — REGADENOSON 0.4 MG/5ML IV SOLN
0.4000 mg | Freq: Once | INTRAVENOUS | Status: AC
  Administered 2020-03-31: 0.4 mg via INTRAVENOUS

## 2020-03-31 MED ORDER — TECHNETIUM TC 99M TETROFOSMIN IV KIT
29.7900 | PACK | Freq: Once | INTRAVENOUS | Status: AC | PRN
  Administered 2020-03-31: 29.79 via INTRAVENOUS

## 2020-04-06 ENCOUNTER — Ambulatory Visit (INDEPENDENT_AMBULATORY_CARE_PROVIDER_SITE_OTHER): Payer: Medicare Other

## 2020-04-06 ENCOUNTER — Other Ambulatory Visit: Payer: Self-pay

## 2020-04-06 DIAGNOSIS — R079 Chest pain, unspecified: Secondary | ICD-10-CM

## 2020-04-06 DIAGNOSIS — I5189 Other ill-defined heart diseases: Secondary | ICD-10-CM

## 2020-04-06 HISTORY — DX: Other ill-defined heart diseases: I51.89

## 2020-04-06 LAB — ECHOCARDIOGRAM COMPLETE
AR max vel: 3.06 cm2
AV Area VTI: 3.15 cm2
AV Area mean vel: 3.32 cm2
AV Mean grad: 2 mmHg
AV Peak grad: 4.7 mmHg
Ao pk vel: 1.08 m/s
Area-P 1/2: 3.53 cm2
Calc EF: 52 %
S' Lateral: 2.7 cm
Single Plane A2C EF: 50.6 %
Single Plane A4C EF: 57.2 %

## 2020-04-07 ENCOUNTER — Telehealth: Payer: Self-pay | Admitting: Cardiovascular Disease

## 2020-04-07 ENCOUNTER — Ambulatory Visit: Payer: Medicare Other | Admitting: Physician Assistant

## 2020-04-07 NOTE — Telephone Encounter (Signed)
Spoke with patient to reschedule appt today due to provider out.   Chart Reviewed with Shane Mcknight and ok for patient to fu after carotid u/s .  Rescheduled visit for 4/7 with visser.  Patient agreed with new appt and agreed to continue to monitor BP and call with any concerns prior to upcoming appt.

## 2020-04-17 ENCOUNTER — Encounter: Payer: Self-pay | Admitting: Internal Medicine

## 2020-04-17 ENCOUNTER — Telehealth: Payer: Self-pay | Admitting: *Deleted

## 2020-04-17 ENCOUNTER — Other Ambulatory Visit: Payer: Self-pay | Admitting: Physician Assistant

## 2020-04-17 ENCOUNTER — Other Ambulatory Visit: Payer: Self-pay

## 2020-04-17 ENCOUNTER — Ambulatory Visit (INDEPENDENT_AMBULATORY_CARE_PROVIDER_SITE_OTHER): Payer: Medicare Other

## 2020-04-17 DIAGNOSIS — D649 Anemia, unspecified: Secondary | ICD-10-CM

## 2020-04-17 DIAGNOSIS — I6523 Occlusion and stenosis of bilateral carotid arteries: Secondary | ICD-10-CM | POA: Diagnosis not present

## 2020-04-17 DIAGNOSIS — E785 Hyperlipidemia, unspecified: Secondary | ICD-10-CM

## 2020-04-17 DIAGNOSIS — I10 Essential (primary) hypertension: Secondary | ICD-10-CM

## 2020-04-17 DIAGNOSIS — E0822 Diabetes mellitus due to underlying condition with diabetic chronic kidney disease: Secondary | ICD-10-CM

## 2020-04-17 DIAGNOSIS — I6529 Occlusion and stenosis of unspecified carotid artery: Secondary | ICD-10-CM

## 2020-04-17 HISTORY — DX: Occlusion and stenosis of unspecified carotid artery: I65.29

## 2020-04-17 NOTE — Telephone Encounter (Signed)
Please place future orders for lab appt.  

## 2020-04-18 NOTE — Telephone Encounter (Signed)
Order placed for f/u labs.  

## 2020-04-19 ENCOUNTER — Other Ambulatory Visit: Payer: Self-pay

## 2020-04-19 ENCOUNTER — Other Ambulatory Visit (INDEPENDENT_AMBULATORY_CARE_PROVIDER_SITE_OTHER): Payer: Medicare Other

## 2020-04-19 DIAGNOSIS — N183 Chronic kidney disease, stage 3 unspecified: Secondary | ICD-10-CM | POA: Diagnosis not present

## 2020-04-19 DIAGNOSIS — D649 Anemia, unspecified: Secondary | ICD-10-CM

## 2020-04-19 DIAGNOSIS — E785 Hyperlipidemia, unspecified: Secondary | ICD-10-CM | POA: Diagnosis not present

## 2020-04-19 DIAGNOSIS — E0822 Diabetes mellitus due to underlying condition with diabetic chronic kidney disease: Secondary | ICD-10-CM

## 2020-04-19 LAB — HEPATIC FUNCTION PANEL
ALT: 14 U/L (ref 0–53)
AST: 13 U/L (ref 0–37)
Albumin: 4.3 g/dL (ref 3.5–5.2)
Alkaline Phosphatase: 49 U/L (ref 39–117)
Bilirubin, Direct: 0.1 mg/dL (ref 0.0–0.3)
Total Bilirubin: 0.4 mg/dL (ref 0.2–1.2)
Total Protein: 7 g/dL (ref 6.0–8.3)

## 2020-04-19 LAB — CBC WITH DIFFERENTIAL/PLATELET
Basophils Absolute: 0 10*3/uL (ref 0.0–0.1)
Basophils Relative: 0.5 % (ref 0.0–3.0)
Eosinophils Absolute: 0.3 10*3/uL (ref 0.0–0.7)
Eosinophils Relative: 6.1 % — ABNORMAL HIGH (ref 0.0–5.0)
HCT: 35.8 % — ABNORMAL LOW (ref 39.0–52.0)
Hemoglobin: 12.1 g/dL — ABNORMAL LOW (ref 13.0–17.0)
Lymphocytes Relative: 17.8 % (ref 12.0–46.0)
Lymphs Abs: 0.9 10*3/uL (ref 0.7–4.0)
MCHC: 33.8 g/dL (ref 30.0–36.0)
MCV: 83.9 fl (ref 78.0–100.0)
Monocytes Absolute: 0.5 10*3/uL (ref 0.1–1.0)
Monocytes Relative: 9.4 % (ref 3.0–12.0)
Neutro Abs: 3.5 10*3/uL (ref 1.4–7.7)
Neutrophils Relative %: 66.2 % (ref 43.0–77.0)
Platelets: 182 10*3/uL (ref 150.0–400.0)
RBC: 4.27 Mil/uL (ref 4.22–5.81)
RDW: 17.7 % — ABNORMAL HIGH (ref 11.5–15.5)
WBC: 5.3 10*3/uL (ref 4.0–10.5)

## 2020-04-19 LAB — BASIC METABOLIC PANEL
BUN: 13 mg/dL (ref 6–23)
CO2: 26 mEq/L (ref 19–32)
Calcium: 9.2 mg/dL (ref 8.4–10.5)
Chloride: 105 mEq/L (ref 96–112)
Creatinine, Ser: 0.85 mg/dL (ref 0.40–1.50)
GFR: 87.4 mL/min (ref >60.00)
Glucose, Bld: 133 mg/dL — ABNORMAL HIGH (ref 70–99)
Potassium: 4.1 mEq/L (ref 3.5–5.1)
Sodium: 140 mEq/L (ref 135–145)

## 2020-04-19 LAB — LIPID PANEL
Cholesterol: 110 mg/dL (ref 0–200)
HDL: 34.2 mg/dL — ABNORMAL LOW (ref >39.00)
LDL Cholesterol: 59 mg/dL (ref 0–99)
NonHDL: 76.22
Total CHOL/HDL Ratio: 3
Triglycerides: 88 mg/dL (ref 0.0–149.0)
VLDL: 17.6 mg/dL (ref 0.0–40.0)

## 2020-04-19 LAB — HEMOGLOBIN A1C: Hgb A1c MFr Bld: 7.1 % — ABNORMAL HIGH (ref 4.6–6.5)

## 2020-04-19 LAB — IBC + FERRITIN
Ferritin: 13.5 ng/mL — ABNORMAL LOW (ref 22.0–322.0)
Iron: 56 ug/dL (ref 42–165)
Saturation Ratios: 15.3 % — ABNORMAL LOW (ref 20.0–50.0)
Transferrin: 261 mg/dL (ref 212.0–360.0)

## 2020-04-20 ENCOUNTER — Encounter: Payer: Self-pay | Admitting: Physician Assistant

## 2020-04-20 ENCOUNTER — Ambulatory Visit: Payer: Medicare Other | Admitting: Physician Assistant

## 2020-04-20 VITALS — BP 140/60 | HR 80 | Ht 68.5 in | Wt 189.0 lb

## 2020-04-20 DIAGNOSIS — I2510 Atherosclerotic heart disease of native coronary artery without angina pectoris: Secondary | ICD-10-CM

## 2020-04-20 DIAGNOSIS — E119 Type 2 diabetes mellitus without complications: Secondary | ICD-10-CM | POA: Diagnosis not present

## 2020-04-20 DIAGNOSIS — I10 Essential (primary) hypertension: Secondary | ICD-10-CM

## 2020-04-20 DIAGNOSIS — G4733 Obstructive sleep apnea (adult) (pediatric): Secondary | ICD-10-CM | POA: Diagnosis not present

## 2020-04-20 DIAGNOSIS — I779 Disorder of arteries and arterioles, unspecified: Secondary | ICD-10-CM | POA: Diagnosis not present

## 2020-04-20 DIAGNOSIS — K222 Esophageal obstruction: Secondary | ICD-10-CM

## 2020-04-20 DIAGNOSIS — E785 Hyperlipidemia, unspecified: Secondary | ICD-10-CM

## 2020-04-20 DIAGNOSIS — I6523 Occlusion and stenosis of bilateral carotid arteries: Secondary | ICD-10-CM | POA: Diagnosis not present

## 2020-04-20 NOTE — Patient Instructions (Signed)
Medication Instructions:  - Your physician has recommended you make the following change in your medication:   1) DECREASE Imdur (isosorbide) 30 mg- take 0.5 tablet (15 mg) by mouth once daily   - if you dizziness/ headaches improve on the lower dose, you may stop this - please call and let us know if you do stop this so we can remove this from your medication list   *If you need a refill on your cardiac medications before your next appointment, please call your pharmacy*   Lab Work: - none ordered  If you have labs (blood work) drawn today and your tests are completely normal, you will receive your results only by: Marland Kitchen MyChart Message (if you have MyChart) OR . A paper copy in the mail If you have any lab test that is abnormal or we need to change your treatment, we will call you to review the results.   Testing/Procedures: - Your physician has requested that you have a carotid duplex- in 6 months. This test is an ultrasound of the carotid arteries in your neck. It looks at blood flow through these arteries that supply the brain with blood. Allow one hour for this exam. There are no restrictions or special instructions.     Follow-Up: At [facility], you and your health needs are our priority.  As part of our continuing mission to provide you with exceptional heart care, we have created designated Provider Care Teams.  These Care Teams include your primary Cardiologist (physician) and Advanced Practice Providers (APPs -  Physician Assistants and Nurse Practitioners) who all work together to provide you with the care you need, when you need it.  We recommend signing up for the patient portal called "MyChart".  Sign up information is provided on this After Visit Summary.  MyChart is used to connect with patients for Virtual Visits (Telemedicine).  Patients are able to view lab/test results, encounter notes, upcoming appointments, etc.  Non-urgent messages can be sent to your provider as  well.   To learn more about what you can do with MyChart, go to [email or web address].    Your next appointment:   3-6 month(s)  The format for your next appointment:   In Person  Provider:   You may see Kathlyn Sacramento, MD or one of the following Advanced Practice Providers on your designated Care Team:    Murray Hodgkins, NP  Christell Faith, PA-C  Marrianne Mood, PA-C  Cadence [place], [place]  Laurann Montana, NP    Other Instructions

## 2020-04-20 NOTE — Progress Notes (Signed)
Office Visit    Patient Name: Shane Mcknight Date of Encounter: 04/20/2020  PCP:  Einar Pheasant, [place]  Cardiologist:  Kathlyn Sacramento, MD  Advanced Practice Provider:  No care team member to display Electrophysiologist:  None   Chief Complaint    Chief Complaint  Patient presents with  . Other    2 week follow up. Meds reviewed verbally with patient.     70 yo with history of CAD s/p RCA stenting in June 2018, carotid artery dz, HTN, HLD, DM2, OSA, GERD, hiatal hernia, and esophageal stricture, who presents for 2 week RTC.  Past Medical History    Past Medical History:  Diagnosis Date  . Abnormal liver function tests   . Allergic state   . Anemia   . Arthritis    spine, hands, shoulder with previous cuff tear  . CAD (coronary artery disease)    a.  11/2015 CT Chest/Abd: Coronary Ca2+ noted;  b. MV: EF 45-54%, med defect of mod severity in basal infsept, basal inf, mid infsept, and mid inf region-> infarct and peri-infarct ischemia-->intermediate risk; c. 05/2016 Cath/PCI: LM 40ost, LAD 30p, 75m, D1 60ost, LCX 60ost, OM1 99 (small->Med Rx), RCA 60p, 95d (2.5x15 Resolute Onyx DES), RPDA/RPLB min irregs, D2 60, EF 55-65%/  . Candidiasis of esophagus ([place])   . Colon polyp   . DDD (degenerative disc disease), lumbosacral   . Dysphagia   . Elevated transaminase level   . Esophageal stricture    a. s/p multiple dilations - last 03/2016.  Marland Kitchen Esophagitis, [place] grade D   . Ganglion cyst of finger of right hand   . GERD (gastroesophageal reflux disease)   . Guaiac positive stools   . Hiatal hernia   . HNP (herniated nucleus pulposus), lumbar   . Hypercholesterolemia   . Hypertension   . Lumbar radiculitis   . Mallory-Weiss tear    a. 11/2015.  . Migraine aura without headache    migraine, visual  . Psoriasis    with psoriatic arthritis  . Psoriatic arthritis (HCC)    severe  . Schatzki's ring   . Sleep apnea    does not uses  CPAP. has lost weight so is much better.  . Type II diabetes mellitus ([place])    Past Surgical History:  Procedure Laterality Date  . ARTHROPLASTY     right thumb; left thumb with 2 screws and plastic joint  . BALLOON DILATION N/A 05/29/2015   Procedure: BALLOON DILATION;  Surgeon: Lollie Sails, MD;  Location: [facility] ENDOSCOPY;  Service: Endoscopy;  Laterality: N/A;  . BALLOON DILATION N/A 02/14/2017   Procedure: BALLOON DILATION;  Surgeon: Lollie Sails, MD;  Location: [facility] ENDOSCOPY;  Service: Endoscopy;  Laterality: N/A;  . BILATERAL CARPAL TUNNEL RELEASE Bilateral 07/04/2017   Procedure: BILATERAL CARPAL TUNNEL RELEASE;  Surgeon: Leanor Kail, MD;  Location: [facility] ORS;  Service: Orthopedics;  Laterality: Bilateral;  . BLEPHAROPLASTY    . CARDIAC CATHETERIZATION     1 stent   . CARPAL TUNNEL RELEASE  2005   right  . CHOLECYSTECTOMY  2008  . COLONOSCOPY    . COLONOSCOPY WITH PROPOFOL N/A 02/13/2018   Procedure: COLONOSCOPY WITH PROPOFOL;  Surgeon: Lollie Sails, MD;  Location: [facility] ENDOSCOPY;  Service: Endoscopy;  Laterality: N/A;  . CORONARY STENT INTERVENTION N/A 06/14/2016   Procedure: Coronary Stent Intervention;  Surgeon: Wellington Hampshire, MD;  Location: [place] CV LAB;  Service:  Cardiovascular;  Laterality: N/A;  . CYST EXCISION  04/14/2015   tendon sheath cyst excision; right ring finger cyst removed and trigger finger realease  . ESOPHAGOGASTRODUODENOSCOPY    . ESOPHAGOGASTRODUODENOSCOPY (EGD) WITH PROPOFOL N/A 05/29/2015   Procedure: ESOPHAGOGASTRODUODENOSCOPY (EGD) WITH PROPOFOL;  Surgeon: Lollie Sails, MD;  Location: [facility] ENDOSCOPY;  Service: Endoscopy;  Laterality: N/A;  . ESOPHAGOGASTRODUODENOSCOPY (EGD) WITH PROPOFOL N/A 07/04/2015   Procedure: ESOPHAGOGASTRODUODENOSCOPY (EGD) WITH PROPOFOL;  Surgeon: Lollie Sails, MD;  Location: [facility] ENDOSCOPY;  Service: Endoscopy;  Laterality: N/A;  . ESOPHAGOGASTRODUODENOSCOPY (EGD) WITH PROPOFOL N/A 01/04/2016    Procedure: ESOPHAGOGASTRODUODENOSCOPY (EGD) WITH PROPOFOL;  Surgeon: Lollie Sails, MD;  Location: [facility] ENDOSCOPY;  Service: Endoscopy;  Laterality: N/A;  . ESOPHAGOGASTRODUODENOSCOPY (EGD) WITH PROPOFOL N/A 03/22/2016   Procedure: ESOPHAGOGASTRODUODENOSCOPY (EGD) WITH PROPOFOL;  Surgeon: Lollie Sails, MD;  Location: [facility] ENDOSCOPY;  Service: Endoscopy;  Laterality: N/A;  . ESOPHAGOGASTRODUODENOSCOPY (EGD) WITH PROPOFOL N/A 02/14/2017   Procedure: ESOPHAGOGASTRODUODENOSCOPY (EGD) WITH PROPOFOL;  Surgeon: Lollie Sails, MD;  Location: [facility] ENDOSCOPY;  Service: Endoscopy;  Laterality: N/A;  . ESOPHAGOGASTRODUODENOSCOPY (EGD) WITH PROPOFOL N/A 04/11/2017   Procedure: ESOPHAGOGASTRODUODENOSCOPY (EGD) WITH PROPOFOL;  Surgeon: Lollie Sails, MD;  Location: [facility] ENDOSCOPY;  Service: Endoscopy;  Laterality: N/A;  . ESOPHAGOGASTRODUODENOSCOPY (EGD) WITH PROPOFOL N/A 07/07/2017   Procedure: ESOPHAGOGASTRODUODENOSCOPY (EGD) WITH PROPOFOL;  Surgeon: Lucilla Lame, MD;  Location: [facility] ENDOSCOPY;  Service: Endoscopy;  Laterality: N/A;  . ESOPHAGOGASTRODUODENOSCOPY (EGD) WITH PROPOFOL N/A 02/13/2018   Procedure: ESOPHAGOGASTRODUODENOSCOPY (EGD) WITH PROPOFOL;  Surgeon: Lollie Sails, MD;  Location: [facility] ENDOSCOPY;  Service: Endoscopy;  Laterality: N/A;  . EYE SURGERY    . FOREIGN BODY REMOVAL N/A 12/03/2015   Procedure: FOREIGN BODY REMOVAL;  Surgeon: Wilford Corner, MD;  Location: [facility] ENDOSCOPY;  Service: Endoscopy;  Laterality: N/A;  . HERNIA REPAIR Right 1970   inguinal  . INGUINAL HERNIA REPAIR Left 01/01/2018   Procedure: OPEN HERNIA REPAIR INGUINAL ADULT;  Surgeon: Olean Ree, MD;  Location: [facility] ORS;  Service: General;  Laterality: Left;  . LEFT HEART CATH Left 06/14/2016   Procedure: Left Heart Cath;  Surgeon: Wellington Hampshire, MD;  Location: [place] CV LAB;  Service: Cardiovascular;  Laterality: Left;  . LEFT HEART CATH AND CORONARY ANGIOGRAPHY Left 09/14/2018   Procedure:  LEFT HEART CATH AND CORONARY ANGIOGRAPHY;  Surgeon: Wellington Hampshire, MD;  Location: [place] CV LAB;  Service: Cardiovascular;  Laterality: Left;  . LUMBAR DISC SURGERY  2004   injections; herniated disc; percutaneous discectomy  . NASAL SINUS SURGERY  2008  . percutaneous lumbar discectomy    . ROTATOR CUFF REPAIR Right   . TONSILLECTOMY      Allergies  Allergies  Allergen Reactions  . Ephedrine Other (See Comments)    Causing prostate to hurt. Using for sinus bronchodilators Causing prostate to hurt. Using for sinus bronchodilators Pain in groin, prostate  . Nsaids Other (See Comments)    GI upset, history of esophagitis and Mallory-Weis tear  . Other Rash, Other (See Comments) and Nausea Only    Paper tape causes rash Plastic tape is okay GI upset, history of esophagitis and Mallory-Weis tear  . Vancomycin Itching and Rash    Same as pcn with itching  . Amoxicillin Itching and Rash  . Hyoscyamine Sulfate Other (See Comments)    Urinary retention Urine retention  . Levsin [Hyoscyamine Sulfate] Other (See Comments)    Urinary retention  . Naproxen Other (See Comments)  GI upset Also happens with other NSAIDS  GI upset Also happens with other NSAIDS GI upset  . Penicillins Itching and Rash    Has patient had a PCN reaction causing immediate rash, facial/tongue/throat swelling, SOB or lightheadedness with hypotension: yes Has patient had a PCN reaction causing severe rash involving mucus membranes or skin necrosis: no Has patient had a PCN reaction that required hospitalization: no Has patient had a PCN reaction occurring within the last 10 years: no If all of the above answers are "NO", then may proceed with Cephalosporin use.   . Shellfish Allergy Rash    Ingested shellfish cCuses rash and itching along with stomach sickness. Pt tolerates betadine   . Sudafed [Pseudoephedrine Hcl] Other (See Comments)    Constricts prostate flow (from ephedrine d/t sinus  meds)    History of Present Illness    PETRO TALENT is a 70 y.o. male with PMH as above.  He had an abnormal stress test 05/2016, followed by cardiac catheterization revealing severe RCA dz and treated with DES.  Most recent 2022 stress test low risk.  Echo with normal EF.  Most recent carotids show bilateral carotid disease with right carotid disease slightly increased in severity.    He has been medically managed for severe obtuse marginal disease.  In August 2020, he started experiencing exertional substernal chest heaviness associated with fatigue, weakness, and mild lightheadedness, occurring with relatively minimal activity, lasting up to about 10 minutes, and resolving with rest.  He noted that his symptoms were similar to what he experienced prior to stenting in 2018 and therefore pursued diagnostic catheterization. 09/14/2018 cardiac cath showed patent distal RCA stent with no significant restenosis. Stable moderate disease in the mid LAD and proximal right coronary artery.  OM1 now completely occluded.  It was 99% stenosed before but overall a small branch.  The only difference noted from most recent angiography was progression of distal RCA disease just proximal to the stent and a tortuous segment causing about 60% stenosis.  It was also noted that he had normal LV systolic function with mildly elevated left ventricular end-diastolic pressure.  No clear culprit was identified for his anginal symptoms despite known moderate three-vessel coronary artery disease.  Imdur 30 mg once daily was added and HCTZ discontinued to avoid hypotension.  It was noted that stress testing could be pursued if his anginal symptoms continued despite optimization of antianginal medication to see if he is ischemic in the RCA or LAD distribution.   Of note,  long-acting nitrates have been deferred in the past due to drop in BP and report of dizziness with SL nitro. He has reported in the past that he would wait to take SL  nitro for chest tightness until the nighttime, as he got lightheadedness and low BP after taking SL nitro.  After his cath as above, however, he was started on Imdur and tolerated this well with resolution of CP.  ARB was decreased for low BP.  He has been continued on Imdur since that time.  He was last seen 11/2019 by Dr. Fletcher Anon doing well from cardiac perspective though with chronic back pain undergoing steroid injections. No changes were made and he was recommended for 6 month follow up.   He called the office 03/20/20 and reported an episode of CP Friday 3/4 evening, as well as a second episode Saturday 3/5 night. Both episodes resolved with nitroglycerin x1. He was seen same day in clinic.  During his visit, he  reported that he had a history of substantial neck and shoulder pain, as well as ongoing back pain, and he wondered if this was contributing to his elevated BP and CP.  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 1 cup of coffee reported daily.  He tried to eat healthy and watch his food/salt/fluid intake. BP and heart rate noted to be elevated, though he wondered if that was 2/2 his current back and shoulder pain at the time of his visit. He reported his heart rate and BP often normalized with relief of pain. Echo and MPI ordered. Labs showed hypokalemia, which was repleted after his visit with repeat labs showing K wnl. Carotids were already ordered and pending. Imdur increased to 60mg  daily for CP and elevated BP; however, he did not tolerate this increased dose, and it was reduced back down to Imdur 30mg  daily on 3/22 for SBP reported in the 90s with HA and dizziness.   04/06/2020 echo showed EF 55-60, NR WMA, G2 DD, mild LAE, RAP 3 mmHg.  03/31/2020 MPI low to low risk study.  CT attenuation images showed aortic and coronary calcifications.  04/17/2020 carotid showed 40 to 59% narrowing of right and left  carotid arteries.  Left carotid artery was unchanged; however, the right carotid artery has progressed.  Today, 04/20/2020, he returns to clinic and notes that he has not had any further episodes of chest pain.  He has ongoing neck and back pain.  He wonders about MSK etiology of his neck pain.  He is looking into better pillows, and hopes this will improve pain.  He does have a history of neck and back pain.  He felt very poor after the recent increase in his Imdur to 60 mg daily.  Specifically, he felt "woozy" or dizzy with this increased Imdur and noted corresponding BP 161W systolic, as well as increased headache.  He called the office and then held his Imdur for 1 day with complete alleviation of sx. He then restarted his Imdur back at Imdur 30mg  the following day, though still noting occasional dizziness and HA. He thus wonders if his sx are 2/2 Imdur.  He has been checking his blood pressure at home with SBP usually in the 100s to 110s when feeling poorly with dizziness and HA.  With lower BP, he has also noted symptoms of orthostasis, which sometimes do not occur right after standing, but rather after few steps (following standing).  He continues to watch his fluid, salt, and coffee intake.  He tries to eat healthy and stay well-hydrated.  He denies any amaurosis fugax.  He denies any weight gain but rather slight weight loss.  He due to his acid reflux and esophageal stricture but denies orthopnea or shortness of breath with laying flat.  No reported abdominal distention, PND, or early satiety.  No other signs or symptoms of volume overload or bleeding.  He reports medication compliance.  Echo, MPI, and carotid studies reviewed.  Home Medications    Current Outpatient Medications on File Prior to Visit  Medication Sig Dispense Refill  . acetaminophen (TYLENOL) 500 MG tablet Take 1,000 mg by mouth daily as needed for moderate pain.    Marland Kitchen aspirin EC 81 MG tablet Take 81 mg by mouth every evening.     Marland Kitchen  atenolol (TENORMIN) 50 MG tablet TAKE 1 TABLET BY MOUTH  TWICE DAILY 180 tablet  3  . COSENTYX SENSOREADY PEN 150 MG/ML SOAJ Inject 300 mg into the skin every 30 (thirty) days.   10  . cyanocobalamin (,VITAMIN B-12,) 1000 MCG/ML injection Inject 1 mL (1,000 mcg total) into the muscle every 30 (thirty) days. 10 mL 1  . folic acid (FOLVITE) 1 MG tablet Take 1 mg by mouth every evening.     . gabapentin (NEURONTIN) 300 MG capsule TAKE 1 TO 2 CAPSULES BY  MOUTH AT BEDTIME 180 capsule 3  . isosorbide mononitrate (IMDUR) 30 MG 24 hr tablet Take 30 mg by mouth daily.    Marland Kitchen losartan (COZAAR) 25 MG tablet Take 0.5 tablets (12.5 mg total) by mouth daily. 45 tablet 3  . metFORMIN (GLUCOPHAGE) 500 MG tablet TAKE 2 TABLETS BY MOUTH  TWICE DAILY WITH A MEAL 360 tablet 3  . methotrexate (RHEUMATREX) 2.5 MG tablet Take 8 tablets (20 mg total) by mouth once a week. Take 8 tablets every Friday. 8 tablet 0  . Multiple Vitamin (MULTIVITAMIN) tablet Take 1 tablet by mouth daily.    . nitroGLYCERIN (NITROSTAT) 0.4 MG SL tablet Place 1 tablet (0.4 mg total) under the tongue every 5 (five) minutes as needed. MAXIMUM OF 3 DOSES. 25 tablet 6  . pantoprazole (PROTONIX) 40 MG tablet TAKE 1 TABLET BY MOUTH  TWICE DAILY 180 tablet 3  . rosuvastatin (CRESTOR) 10 MG tablet Take half tablet (5 MG) by mouth daily    . SYRINGE-NEEDLE, DISP, 3 ML 25G X 5/8" 3 ML MISC Use as instructed with B12 injection. 50 each 11  . triamcinolone cream (KENALOG) 0.5 % Apply 1 application topically 2 (two) times daily as needed (scaling).    . vitamin E 400 UNIT capsule Take 400 Units by mouth every evening.    . potassium chloride 20 MEQ TBCR Take 20 mEq by mouth daily for 2 days. 2 tablet 0   No current facility-administered medications on file prior to visit.    Review of Systems    He reports resolution of previously reported CP.  He continues to note neck pain, attributed to MSK etiology.  He notes symptoms consistent with orthostasis.  At  times, his dizziness occurs after getting up and walking several paces.  He elevates his head at night, but this is due to his GERD/esophageal stricture.  He denies palpitations, dyspnea, pnd, orthopnea, n, v, syncope, edema, weight gain, or early satiety.   All other systems reviewed and are otherwise negative except as noted above.  Physical Exam    VS:  BP 140/60 (BP Location: Left Arm, Patient Position: Sitting, Cuff Size: Normal)   Pulse 80   Ht 5' 8.5" (1.74 m)   Wt 198 lb (89.8 kg)   SpO2 96%   BMI 29.67 kg/m  , BMI Body mass index is 29.67 kg/m. GEN: Well nourished, well developed, in no acute distress. HEENT: normal. Neck: Supple, no JVD, carotid bruits, or masses. Cardiac: RRR, no murmurs, rubs, or gallops. No clubbing, cyanosis, pitting edema.  Radials/DP/PT 2+ and equal bilaterally.  Of note, right radial pulse not able to be palpated due to patient report of nerve pain (chronic) if palpating the right radial artery.  Note made to remember this in the future. Respiratory:  Respirations regular and unlabored, clear to auscultation bilaterally. GI: firm / slightly distended, BS + x 4. MS: no deformity or atrophy. Skin: warm and dry, no rash. Neuro:  Strength and sensation are intact. Psych: Normal affect.  Accessory Clinical Findings  ECG personally reviewed by me today - No EKG- no acute changes.  VITALS Reviewed today   Temp Readings from Last 3 Encounters:  12/24/19 98.5 F (36.9 C)  08/06/19 98.3 F (36.8 C)  05/07/19 (!) 97 F (36.1 C)   BP Readings from Last 3 Encounters:  04/20/20 140/60  03/20/20 (!) 152/84  12/25/19 122/62   Pulse Readings from Last 3 Encounters:  04/20/20 80  03/20/20 92  12/24/19 77    Wt Readings from Last 3 Encounters:  04/20/20 189 lb (85.7 kg)  03/20/20 183 lb (83 kg)  02/15/20 183 lb (83 kg)     LABS  reviewed today   Lab Results  Component Value Date   WBC 5.3 04/19/2020   HGB 12.1 (L) 04/19/2020   HCT 35.8 (L)  04/19/2020   MCV 83.9 04/19/2020   PLT 182.0 04/19/2020   Lab Results  Component Value Date   CREATININE 0.85 04/19/2020   BUN 13 04/19/2020   NA 140 04/19/2020   K 4.1 04/19/2020   CL 105 04/19/2020   CO2 26 04/19/2020   Lab Results  Component Value Date   ALT 14 04/19/2020   AST 13 04/19/2020   ALKPHOS 49 04/19/2020   BILITOT 0.4 04/19/2020   Lab Results  Component Value Date   CHOL 110 04/19/2020   HDL 34.20 (L) 04/19/2020   LDLCALC 59 04/19/2020   TRIG 88.0 04/19/2020   CHOLHDL 3 04/19/2020    Lab Results  Component Value Date   HGBA1C 7.1 (H) 04/19/2020   Lab Results  Component Value Date   TSH 2.05 07/30/2019     STUDIES/PROCEDURES reviewed today   Carotids 04/17/20 Summary:  Right Carotid: Velocities in the right ICA are consistent with a 40-59%         stenosis.         ICA velocities have increased since prior exam.         Non-hemodynamically significant plaque <50% noted in the  CCA.  Left Carotid: Velocities in the left ICA are consistent with a 40-59%  stenosis.        Non-hemodynamically significant plaque <50% noted in the  CCA.        The ECA appears >50% stenosed.  Vertebrals: Bilateral vertebral arteries demonstrate antegrade flow.  Right        vertebral artery demonstrates high resistant flow.  Subclavians: Normal flow hemodynamics were seen in bilateral subclavian        arteries.   MPI 03/2020  There was no ST segment deviation noted during stress.  No T wave inversion was noted during stress.  The study is normal.  This is a low risk study.  The left ventricular ejection fraction is normal (55-65%).  CT attenuation images show evidence of aortic and coronary calcifications.  Echo 04/06/20 1. Left ventricular ejection fraction, by estimation, is 55 to 60%. The  left ventricle has normal function. The left ventricle has no regional  Kevorkian motion abnormalities. Left  ventricular diastolic parameters are  consistent with Grade II diastolic  dysfunction (pseudonormalization).  2. Right ventricular systolic function is normal. The right ventricular  size is normal.  3. Left atrial size was mildly dilated.  4. The mitral valve is normal in structure. No evidence of mitral valve  regurgitation.  5. The aortic valve is tricuspid. Aortic valve regurgitation is not  visualized.  6. The inferior vena cava is normal in size with greater than 50%  respiratory variability, suggesting right  atrial pressure of 3 mmHg.   LHC 08/2018  Ost LM to LM lesion is 40% stenosed.  Mid LAD lesion is 50% stenosed.  Prox LAD lesion is 30% stenosed.  Ost 1st Diag to 1st Diag lesion is 60% stenosed.  Ost Cx lesion is 40% stenosed.  1st Mrg lesion is 100% stenosed.  Prox RCA to Mid RCA lesion is 60% stenosed.  Dist RCA-1 lesion is 60% stenosed.  Previously placed Dist RCA-2 stent (unknown type) is widely patent.  RPAV lesion is 50% stenosed.  The left ventricular systolic function is normal.  LV end diastolic pressure is mildly elevated.  The left ventricular ejection fraction is 55-65% by visual estimate. 1.  Patent distal RCA stent with no significant restenosis.  Stable moderate disease in the mid LAD and proximal right coronary artery.  OM1 is now completely occluded.  It was 99% stenosed before but overall a small branch.  The only difference from most recent angiography is progression of distal RCA disease just proximal to the stent in a tortuous segment causing about 60% stenosis. 2.  Normal LV systolic function mildly elevated left ventricular end-diastolic pressure. Recommendations: No clear culprit for patient's anginal symptoms although he does have known moderate three-vessel coronary artery disease.  I am going to add Imdur 30 mg once daily.  I discontinued hydrochlorothiazide to avoid hypotension. If the patient has refractory anginal symptoms in  spite of optimizing his antianginal medications, stress testing can be considered to see if he is ischemic in the RCA or LAD distribution.  Carotid US 03/2019 Summary:  Right Carotid: Velocities in the right ICA are consistent with a 1-39%  stenosis.         Non-hemodynamically significant plaque <50% noted in the  CCA.         The ECA appears <50% stenosed.  Left Carotid: Velocities in the left ICA are consistent with a 40-59%  stenosis.        Non-hemodynamically significant plaque <50% noted in the  CCA.        The ECA appears >50% stenosed.  Vertebrals: Bilateral vertebral arteries demonstrate antegrade flow.  Subclavians: Normal flow hemodynamics were seen in bilateral subclavian        arteries.  *See table(s) above for measurements and observations.  Suggest follow up study in 12 months.   Assessment & Plan    Coronary artery disease s/p RCA PCI 06/2016 --Previous CP resolved. Euvolemic on exam. S/p 09/14/18 cath with patent RCA stent and stable moderate disease / tortuous RCA. Echo with nl EF, NRWMA. MPI low risk. Due to CP at last visit, we increased Imdur with pt report of low BP and dizziness. He then held his Imdur and felt much improved, then restarted at Imdur 30mg  daily. He does report ongoing intermittent dizziness and HA with recommendations as below. MSK etiology of CP again discussed, given it occurs with worsening neck, back, and shoulder pain. Worsening pain also results in increased BP, which likely also contributes to CP, as previously noted.    No further ischemic work-up indicated at this time.  Continue medical management with ASA, atenolol, statin, PRN SL nitro, and losartan.  As below, consider Farxiga at RTC.  He wonders today if he might benefit from weaning off of his Imdur, given he is noticing the Imdur is associated dizziness and headache.  He felt his best when holding Imdur 1 day.  We will therefore decrease to Imdur  15 mg daily. If he notices improved  HA and dizziness with decreased Imdur, we will wean him off of Imdur. He will let us know if recurrent CP with reduced dose. He will also closely monitor BP with goal BP 130/30 or lower. If BP consistently elevated with Imdur reduced dose, he will call the office, at which time we will plan to up-titrate atenolol /losartan as needed to provide BP support in place of the Imdur. He is agreeable to this plan.    Essential hypertension, BP goal below 130/80 --BP today suboptimal at 140/60 with pt report it has been low at home.  He reports BP often elevated at the office and when he is experiencing pain.  Home SBP reported as in the low 100s with associated dizziness and headache.  Plan as above is to decrease Imdur dose, as he wonders if this is contributing to his HA and dizziness, given he felt improvement when holding it for 1 day. We discussed goal BP 130/80 or lower, and he will monitor BP closely with decreased dose of Imdur. We discussed increasing the dose of his beta-blocker / losartan, if BP support needed in place of Imdur. He is agreeable. Continue to monitor salt and fluids.  Continue healthy diet/lifestyle changes.  Hyperlipidemia, LDL goal below 70 --12/2019 LDL 59 and at goal with Tg 108.0.  Continue current low dose rosuvastatin 5 mg daily.   DM2 --Glycemic control recommended for risk factor modification. Consider Wilder Glade in the future.  GERD / Schatzki's ring / hiatal hernia/ esophageal stricture --Followed closely by GI with procedures ongoing for stretching his esophagus.  --Continue PPI. No reported s/sx of bleeding. --Does not tolerate higher doses of Imdur -we will attempt to wean as above. --Follow-up with GI as directed.  OSA --Discussed diagnosis of sleep apnea.  Recommended CPAP for risk factor modification.  He reports today that he cannot tolerate his CPAP mask.  Briefly discussed alternatives to CPAP, including the mouth guard.   Treatment of CPAP recommended from a cardiovascular standpoint with patient understanding.  Carotid dz /repeat imaging recommended --No reported s/sx of worsening carotid dz. recent updated studies as above with 40 to 59% bilateral carotid artery stenosis.  Right side stenosis has progressed from previous studies with recommendation for aggressive risk factor modification.  Continue ASA, statin.    Medication changes: Decreased to Imdur 15mg  daily, as he suspects his symptoms are 2/2 Imdur as above.   Labs ordered: None Future considerations: Increase atenolol/losartan if elevated BP 130/80 or higher with decreased Imdur. He will call if BP elevated. Consider Wilder Glade.  Repeat carotids - order placed today. Studies / Imaging ordered: Carotids.. Disposition: RTC 3-6 months   Arvil Chaco, PA-C 04/20/2020

## 2020-04-21 ENCOUNTER — Encounter: Payer: Medicare Other | Admitting: Internal Medicine

## 2020-05-12 ENCOUNTER — Other Ambulatory Visit: Payer: Self-pay

## 2020-05-12 ENCOUNTER — Ambulatory Visit (INDEPENDENT_AMBULATORY_CARE_PROVIDER_SITE_OTHER): Payer: Medicare Other | Admitting: Internal Medicine

## 2020-05-12 ENCOUNTER — Encounter: Payer: Self-pay | Admitting: Internal Medicine

## 2020-05-12 VITALS — BP 134/70 | HR 67 | Temp 97.6°F | Ht 68.5 in | Wt 184.5 lb

## 2020-05-12 DIAGNOSIS — N183 Chronic kidney disease, stage 3 unspecified: Secondary | ICD-10-CM

## 2020-05-12 DIAGNOSIS — I25119 Atherosclerotic heart disease of native coronary artery with unspecified angina pectoris: Secondary | ICD-10-CM | POA: Diagnosis not present

## 2020-05-12 DIAGNOSIS — Z125 Encounter for screening for malignant neoplasm of prostate: Secondary | ICD-10-CM

## 2020-05-12 DIAGNOSIS — Z23 Encounter for immunization: Secondary | ICD-10-CM

## 2020-05-12 DIAGNOSIS — L4050 Arthropathic psoriasis, unspecified: Secondary | ICD-10-CM | POA: Diagnosis not present

## 2020-05-12 DIAGNOSIS — R3915 Urgency of urination: Secondary | ICD-10-CM | POA: Diagnosis not present

## 2020-05-12 DIAGNOSIS — I779 Disorder of arteries and arterioles, unspecified: Secondary | ICD-10-CM

## 2020-05-12 DIAGNOSIS — K222 Esophageal obstruction: Secondary | ICD-10-CM

## 2020-05-12 DIAGNOSIS — D649 Anemia, unspecified: Secondary | ICD-10-CM

## 2020-05-12 DIAGNOSIS — I5022 Chronic systolic (congestive) heart failure: Secondary | ICD-10-CM

## 2020-05-12 DIAGNOSIS — E785 Hyperlipidemia, unspecified: Secondary | ICD-10-CM | POA: Diagnosis not present

## 2020-05-12 DIAGNOSIS — M79672 Pain in left foot: Secondary | ICD-10-CM | POA: Diagnosis not present

## 2020-05-12 DIAGNOSIS — K219 Gastro-esophageal reflux disease without esophagitis: Secondary | ICD-10-CM | POA: Diagnosis not present

## 2020-05-12 DIAGNOSIS — E0822 Diabetes mellitus due to underlying condition with diabetic chronic kidney disease: Secondary | ICD-10-CM

## 2020-05-12 DIAGNOSIS — Z0000 Encounter for general adult medical examination without abnormal findings: Secondary | ICD-10-CM

## 2020-05-12 DIAGNOSIS — I10 Essential (primary) hypertension: Secondary | ICD-10-CM

## 2020-05-12 LAB — HM DIABETES FOOT EXAM

## 2020-05-12 NOTE — Progress Notes (Signed)
Patient ID: Shane Mcknight, male   DOB: May 02, 1950, 70 y.o.   MRN: [id number]   Subjective:    Patient ID: Shane Mcknight, male    DOB: 03-27-1950, 70 y.o.   MRN: [id number]  HPI This visit occurred during the SARS-CoV-2 public health emergency.  Safety protocols were in place, including screening questions prior to the visit, additional usage of staff PPE, and extensive cleaning of exam room while observing appropriate contact time as indicated for disinfecting solutions.  Patient here for his physical exam.  Recently evaluated by cardiology.  In March, experienced chest pain - resolved with SL NTG.  Saw cardiology.  He was questioning if some of his MSK pain (back and shoulder pain) - could be contributing.  He had previously been placed on imdur.  HCTZ held.  ARB adjusted to avoid low blood pressure.  After the above, imdur increased to 88m q day.  ECHO 04/06/20 - EF 55-60%, no regional Athey motion abnormality, G2 DD, mild LAE. 03/31/20 - MPI - low to low risk study.  Had f/u 04/20/20.  Reported did not tolerate imdur 691m- dizziness and light headedness.  Since decreasing to 3044m day, feels better.  Denies any dizziness now.  No chest pain.  Breathing stable.  No increased cough.  No abdominal pain.  Does report over the last few months, has noticed some increased urinary urgency.  Some intermittent low urinary pressure - noticed a couple of times.  Pressure improves as day progresses. No hematuria.  Bowels moving.  Intermittent loose stool. No significant diarrhea.  Increased pain left lateral foot - fifth toe.  Request referral to podiatry.   Past Medical History:  Diagnosis Date  . Abnormal liver function tests   . Allergic state   . Anemia   . Arthritis    spine, hands, shoulder with previous cuff tear  . CAD (coronary artery disease)    a.  11/2015 CT Chest/Abd: Coronary Ca2+ noted;  b. MV: EF 45-54%, med defect of mod severity in basal infsept, basal inf, mid infsept, and mid inf region->  infarct and peri-infarct ischemia-->intermediate risk; c. 05/2016 Cath/PCI: LM 40ost, LAD 30p, 9m82m 60ost, LCX 60ost, OM1 99 (small->Med Rx), RCA 60p, 95d (2.5x15 Resolute Onyx DES), RPDA/RPLB min irregs, D2 60, EF 55-65%/  . Candidiasis of esophagus (HCC)[place]. Colon polyp   . DDD (degenerative disc disease), lumbosacral   . Dysphagia   . Elevated transaminase level   . Esophageal stricture    a. s/p multiple dilations - last 03/2016.  . EsMarland Kitchenphagitis, [place] grade D   . Ganglion cyst of finger of right hand   . GERD (gastroesophageal reflux disease)   . Guaiac positive stools   . Hiatal hernia   . HNP (herniated nucleus pulposus), lumbar   . Hypercholesterolemia   . Hypertension   . Lumbar radiculitis   . Mallory-Weiss tear    a. 11/2015.  . Migraine aura without headache    migraine, visual  . Psoriasis    with psoriatic arthritis  . Psoriatic arthritis (HCC)    severe  . Schatzki's ring   . Sleep apnea    does not uses CPAP. has lost weight so is much better.  . Type II diabetes mellitus (HCC)[place] Past Surgical History:  Procedure Laterality Date  . ARTHROPLASTY     right thumb; left thumb with 2 screws and plastic joint  . BALLOON DILATION N/A 05/29/2015   Procedure:  BALLOON DILATION;  Surgeon: Lollie Sails, MD;  Location: [facility] ENDOSCOPY;  Service: Endoscopy;  Laterality: N/A;  . BALLOON DILATION N/A 02/14/2017   Procedure: BALLOON DILATION;  Surgeon: Lollie Sails, MD;  Location: [facility] ENDOSCOPY;  Service: Endoscopy;  Laterality: N/A;  . BILATERAL CARPAL TUNNEL RELEASE Bilateral 07/04/2017   Procedure: BILATERAL CARPAL TUNNEL RELEASE;  Surgeon: Leanor Kail, MD;  Location: [facility] ORS;  Service: Orthopedics;  Laterality: Bilateral;  . BLEPHAROPLASTY    . CARDIAC CATHETERIZATION     1 stent   . CARPAL TUNNEL RELEASE  2005   right  . CHOLECYSTECTOMY  2008  . COLONOSCOPY    . COLONOSCOPY WITH PROPOFOL N/A 02/13/2018   Procedure: COLONOSCOPY WITH PROPOFOL;   Surgeon: Lollie Sails, MD;  Location: [facility] ENDOSCOPY;  Service: Endoscopy;  Laterality: N/A;  . CORONARY STENT INTERVENTION N/A 06/14/2016   Procedure: Coronary Stent Intervention;  Surgeon: Wellington Hampshire, MD;  Location: [place] CV LAB;  Service: Cardiovascular;  Laterality: N/A;  . CYST EXCISION  04/14/2015   tendon sheath cyst excision; right ring finger cyst removed and trigger finger realease  . ESOPHAGOGASTRODUODENOSCOPY    . ESOPHAGOGASTRODUODENOSCOPY (EGD) WITH PROPOFOL N/A 05/29/2015   Procedure: ESOPHAGOGASTRODUODENOSCOPY (EGD) WITH PROPOFOL;  Surgeon: Lollie Sails, MD;  Location: [facility] ENDOSCOPY;  Service: Endoscopy;  Laterality: N/A;  . ESOPHAGOGASTRODUODENOSCOPY (EGD) WITH PROPOFOL N/A 07/04/2015   Procedure: ESOPHAGOGASTRODUODENOSCOPY (EGD) WITH PROPOFOL;  Surgeon: Lollie Sails, MD;  Location: [facility] ENDOSCOPY;  Service: Endoscopy;  Laterality: N/A;  . ESOPHAGOGASTRODUODENOSCOPY (EGD) WITH PROPOFOL N/A 01/04/2016   Procedure: ESOPHAGOGASTRODUODENOSCOPY (EGD) WITH PROPOFOL;  Surgeon: Lollie Sails, MD;  Location: [facility] ENDOSCOPY;  Service: Endoscopy;  Laterality: N/A;  . ESOPHAGOGASTRODUODENOSCOPY (EGD) WITH PROPOFOL N/A 03/22/2016   Procedure: ESOPHAGOGASTRODUODENOSCOPY (EGD) WITH PROPOFOL;  Surgeon: Lollie Sails, MD;  Location: [facility] ENDOSCOPY;  Service: Endoscopy;  Laterality: N/A;  . ESOPHAGOGASTRODUODENOSCOPY (EGD) WITH PROPOFOL N/A 02/14/2017   Procedure: ESOPHAGOGASTRODUODENOSCOPY (EGD) WITH PROPOFOL;  Surgeon: Lollie Sails, MD;  Location: [facility] ENDOSCOPY;  Service: Endoscopy;  Laterality: N/A;  . ESOPHAGOGASTRODUODENOSCOPY (EGD) WITH PROPOFOL N/A 04/11/2017   Procedure: ESOPHAGOGASTRODUODENOSCOPY (EGD) WITH PROPOFOL;  Surgeon: Lollie Sails, MD;  Location: [facility] ENDOSCOPY;  Service: Endoscopy;  Laterality: N/A;  . ESOPHAGOGASTRODUODENOSCOPY (EGD) WITH PROPOFOL N/A 07/07/2017   Procedure: ESOPHAGOGASTRODUODENOSCOPY (EGD) WITH PROPOFOL;  Surgeon: Lucilla Lame, MD;  Location: [facility] ENDOSCOPY;  Service: Endoscopy;  Laterality: N/A;  . ESOPHAGOGASTRODUODENOSCOPY (EGD) WITH PROPOFOL N/A 02/13/2018   Procedure: ESOPHAGOGASTRODUODENOSCOPY (EGD) WITH PROPOFOL;  Surgeon: Lollie Sails, MD;  Location: [facility] ENDOSCOPY;  Service: Endoscopy;  Laterality: N/A;  . EYE SURGERY    . FOREIGN BODY REMOVAL N/A 12/03/2015   Procedure: FOREIGN BODY REMOVAL;  Surgeon: Wilford Corner, MD;  Location: [facility] ENDOSCOPY;  Service: Endoscopy;  Laterality: N/A;  . HERNIA REPAIR Right 1970   inguinal  . INGUINAL HERNIA REPAIR Left 01/01/2018   Procedure: OPEN HERNIA REPAIR INGUINAL ADULT;  Surgeon: Olean Ree, MD;  Location: [facility] ORS;  Service: General;  Laterality: Left;  . LEFT HEART CATH Left 06/14/2016   Procedure: Left Heart Cath;  Surgeon: Wellington Hampshire, MD;  Location: [place] CV LAB;  Service: Cardiovascular;  Laterality: Left;  . LEFT HEART CATH AND CORONARY ANGIOGRAPHY Left 09/14/2018   Procedure: LEFT HEART CATH AND CORONARY ANGIOGRAPHY;  Surgeon: Wellington Hampshire, MD;  Location: [place] CV LAB;  Service: Cardiovascular;  Laterality: Left;  . LUMBAR DISC SURGERY  2004   injections; herniated disc; percutaneous  discectomy  . NASAL SINUS SURGERY  2008  . percutaneous lumbar discectomy    . ROTATOR CUFF REPAIR Right   . TONSILLECTOMY     Family History  Problem Relation Age of Onset  . Lung cancer Father   . Cancer Father   . Hypertension Mother   . Aneurysm Son    Social History   Socioeconomic History  . Marital status: Married    Spouse name: Pamala Hurry  . Number of children: Not on file  . Years of education: Not on file  . Highest education level: Not on file  Occupational History    Comment: not heavy work  Tobacco Use  . Smoking status: Former Smoker    Types: Cigarettes    Quit date: 07/06/1982    Years since quitting: 37.8  . Smokeless tobacco: Never Used  Vaping Use  . Vaping Use: Never used  Substance and Sexual  Activity  . Alcohol use: Yes    Alcohol/week: 0.0 standard drinks  . Drug use: No  . Sexual activity: Not on file  Other Topics Concern  . Not on file  Social History Narrative   Lives locally with wife.  Active but does not routinely exercise.   Social Determinants of Health   Financial Resource Strain: Low Risk   . Difficulty of Paying Living Expenses: Not hard at all  Food Insecurity: No Food Insecurity  . Worried About Charity fundraiser in the Last Year: Never true  . Ran Out of Food in the Last Year: Never true  Transportation Needs: No Transportation Needs  . Lack of Transportation (Medical): No  . Lack of Transportation (Non-Medical): No  Physical Activity: Not on file  Stress: No Stress Concern Present  . Feeling of Stress : Not at all  Social Connections: Unknown  . Frequency of Communication with Friends and Family: Not on file  . Frequency of Social Gatherings with Friends and Family: Not on file  . Attends Religious Services: Not on file  . Active Member of Clubs or Organizations: Not on file  . Attends Archivist Meetings: Not on file  . Marital Status: Married    Outpatient Encounter Medications as of 05/12/2020  Medication Sig  . acetaminophen (TYLENOL) 500 MG tablet Take 1,000 mg by mouth daily as needed for moderate pain.  Marland Kitchen aspirin EC 81 MG tablet Take 81 mg by mouth every evening.   Marland Kitchen atenolol (TENORMIN) 50 MG tablet TAKE 1 TABLET BY MOUTH  TWICE DAILY  . COSENTYX SENSOREADY PEN 150 MG/ML SOAJ Inject 300 mg into the skin every 30 (thirty) days.   . cyanocobalamin (,VITAMIN B-12,) 1000 MCG/ML injection Inject 1 mL (1,000 mcg total) into the muscle every 30 (thirty) days.  . folic acid (FOLVITE) 1 MG tablet Take 1 mg by mouth every evening.   . gabapentin (NEURONTIN) 300 MG capsule TAKE 1 TO 2 CAPSULES BY  MOUTH AT BEDTIME  . isosorbide mononitrate (IMDUR) 30 MG 24 hr tablet Take 0.5 tablet (15 mg) by mouth once daily   . metFORMIN (GLUCOPHAGE)  500 MG tablet TAKE 2 TABLETS BY MOUTH  TWICE DAILY WITH A MEAL  . methotrexate (RHEUMATREX) 2.5 MG tablet Take 8 tablets (20 mg total) by mouth once a week. Take 8 tablets every Friday.  . Multiple Vitamin (MULTIVITAMIN) tablet Take 1 tablet by mouth daily.  . nitroGLYCERIN (NITROSTAT) 0.4 MG SL tablet Place 1 tablet (0.4 mg total) under the tongue every 5 (five) minutes as needed.  MAXIMUM OF 3 DOSES.  Marland Kitchen pantoprazole (PROTONIX) 40 MG tablet TAKE 1 TABLET BY MOUTH  TWICE DAILY  . rosuvastatin (CRESTOR) 10 MG tablet Take half tablet (5 MG) by mouth daily  . SYRINGE-NEEDLE, DISP, 3 ML 25G X 5/8" 3 ML MISC Use as instructed with B12 injection.  . triamcinolone cream (KENALOG) 0.5 % Apply 1 application topically 2 (two) times daily as needed (scaling).  . vitamin E 400 UNIT capsule Take 400 Units by mouth every evening.  Marland Kitchen losartan (COZAAR) 25 MG tablet Take 0.5 tablets (12.5 mg total) by mouth daily.  . potassium chloride 20 MEQ TBCR Take 20 mEq by mouth daily for 2 days.   No facility-administered encounter medications on file as of 05/12/2020.    Review of Systems  Constitutional: Negative for appetite change and unexpected weight change.  HENT: Negative for congestion, sinus pressure and sore throat.   Eyes: Negative for pain and visual disturbance.  Respiratory: Negative for cough, chest tightness and shortness of breath.   Cardiovascular: Negative for palpitations and leg swelling.       No chest pain currently.   Gastrointestinal: Negative for abdominal pain, diarrhea, nausea and vomiting.  Genitourinary: Positive for urgency. Negative for hematuria.       Urinary pressure.   Musculoskeletal: Negative for joint swelling and myalgias.       Back pain and shoulder pain.   Skin: Negative for color change and rash.  Neurological: Negative for dizziness, light-headedness and headaches.       No dizziness or light headedness now.   Hematological: Negative for adenopathy. Does not  bruise/bleed easily.  Psychiatric/Behavioral: Negative for agitation and dysphoric mood.       Objective:    Physical Exam Vitals reviewed.  Constitutional:      General: He is not in acute distress.    Appearance: Normal appearance. He is well-developed.  HENT:     Head: Normocephalic and atraumatic.     Right Ear: External ear normal.     Left Ear: External ear normal.  Eyes:     General: No scleral icterus.       Right eye: No discharge.        Left eye: No discharge.     Conjunctiva/sclera: Conjunctivae normal.  Neck:     Thyroid: No thyromegaly.  Cardiovascular:     Rate and Rhythm: Normal rate and regular rhythm.  Pulmonary:     Effort: No respiratory distress.     Breath sounds: Normal breath sounds. No wheezing.  Abdominal:     General: Bowel sounds are normal.     Palpations: Abdomen is soft.     Tenderness: There is no abdominal tenderness.  Musculoskeletal:        General: No swelling or tenderness.     Cervical back: Neck supple. No tenderness.  Lymphadenopathy:     Cervical: No cervical adenopathy.  Skin:    Findings: No erythema or rash.  Neurological:     Mental Status: He is alert and oriented to person, place, and time.  Psychiatric:        Mood and Affect: Mood normal.        Behavior: Behavior normal.     BP 134/70   Pulse 67   Temp 97.6 F (36.4 C) (Oral)   Ht 5' 8.5" (1.74 m)   Wt 184 lb 8 oz (83.7 kg)   SpO2 98%   BMI 27.65 kg/m  Wt Readings from Last 3 Encounters:  05/12/20 184 lb 8 oz (83.7 kg)  04/20/20 189 lb (85.7 kg)  03/20/20 183 lb (83 kg)     Lab Results  Component Value Date   WBC 5.3 04/19/2020   HGB 12.1 (L) 04/19/2020   HCT 35.8 (L) 04/19/2020   PLT 182.0 04/19/2020   GLUCOSE 133 (H) 04/19/2020   CHOL 110 04/19/2020   TRIG 88.0 04/19/2020   HDL 34.20 (L) 04/19/2020   LDLCALC 59 04/19/2020   ALT 14 04/19/2020   AST 13 04/19/2020   NA 140 04/19/2020   K 4.1 04/19/2020   CL 105 04/19/2020   CREATININE 0.85  04/19/2020   BUN 13 04/19/2020   CO2 26 04/19/2020   TSH 2.05 07/30/2019   PSA 1.65 05/12/2020   INR 1.1 06/07/2016   HGBA1C 7.1 (H) 04/19/2020   MICROALBUR 20.2 (H) 12/17/2019    NM Myocar Multi W/Spect W/Robb Motion / EF  Result Date: 03/31/2020  There was no ST segment deviation noted during stress.  No T wave inversion was noted during stress.  The study is normal.  This is a low risk study.  The left ventricular ejection fraction is normal (55-65%).  CT attenuation images show evidence of aortic and coronary calcifications.        Assessment & Plan:   Problem List Items Addressed This Visit    Anemia    Being followed by GI.  S/p recent EGD.  Colonoscopy 2020.  Follow cbc.       Relevant Orders   Ferritin   Vitamin B12   Carotid artery disease ([place])    04/17/2020 carotid showed 40 to 59% narrowing of right and left carotid arteries.  Left carotid artery was unchanged; however, the right carotid artery has progressed.  Followed by cardiology.  Continue statin medication and risk factor modification.       Chronic systolic heart failure (HCC)    No evidence of volume overload on exam.  Continue imdur, beta blocker and losartan.  Stable.       Coronary artery disease with angina pectoris ([place])    09/14/2018 cardiac cath showed patent distal RCA stent with no significant restenosis. Stable moderate disease in the mid LAD and proximal right coronary artery. OM1 now completely occluded. It was 99% stenosed before but overall a small branch. Per note, the only difference noted from most recent angiography was progression of distal RCA disease just proximal to the stent and a tortuous segment causing about 60% stenosis. It was also noted that he had normal LV systolic function with mildly elevated left ventricular end-diastolic pressure. recent chest pain. Addition of imdur.  No further chest pain as outlined.  Tolerating 44m imdur.  Continue statin and risk factor  modfication.        Relevant Orders   AMB Referral to [facility]ordin[facility]   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, without long-term current use of insulin (HCC)    Low carb diet and exercise.  Follow met b and a1c.  Recent a1c elevated.  Discussed additional medication.  He feels the increase is related to limitations in diet - had to eat differently with his dysphagia issues.  Plans to adjust his diet.  Discussed other treatment including possibility of farxiga/jardiance. Concern regarding current urinary issues.  Also discussed GLP-1 - concern regarding his GI issues.  Overall appear to be doing better regarding GI concerns.  He is concerned regarding cost of some medications.  Discussed CCM  referral.  Agreeable.       Relevant Orders   AMB Referral to [facility]   Hemoglobin A1c   Foot pain, left    Pain - left lateral - under 5th toe.  Request referral to podiatry.        Relevant Orders   Ambulatory referral to Podiatry   GERD (gastroesophageal reflux disease)    Continue protonix.  Acid reflux appears to be controlled.       Health care maintenance    Physical today 05/12/20.  Check psa today.  Colonoscopy 02/13/18 - diverticulosis and internal hemorhoids       Relevant Orders   Pneumococcal conjugate vaccine 20-valent (Prevnar 20) (Completed)   Hyperlipidemia    Continue crestor.  Low cholesterol diet and exercise.  Follow lipid panel and liver function tests.        Relevant Orders   Hepatic function panel   Lipid panel   Hypertension    Continue losartan, atenolol and imdur.  Follow pressures.  Follow metabolic panel.       Relevant Orders   TSH   Basic metabolic panel   Need for pneumococcal vaccination    Only had pneumovax.  prevnar 20 given today. Reports no allergies to pneumonia vaccine.       Psoriatic arthritis ([place])    Followed by Dr Jefm Bryant.  Consentyx.  Stable.       Relevant  Orders   AMB Referral to Community Care Coordin[facility]   CBC with Differential/Platelet   Stricture and stenosis of esophagus    S/p EGD and dilatation.  Swallowing has been better overall.  Discussed f/u with GI if increased dysphagia.       Urinary urgency    Reports intermittent issues with urgency as outlined.  Discussed prostate issues.  Will check urine to confirm no infection.        Relevant Orders   Urinalysis, Routine w reflex microscopic (Completed)   Urine Culture    Other Visit Diagnoses    Prostate cancer screening    -  Primary   Relevant Orders   PSA (Completed)   Routine general medical examination at a health care facility           Einar Pheasant, MD

## 2020-05-13 ENCOUNTER — Encounter: Payer: Self-pay | Admitting: Internal Medicine

## 2020-05-13 DIAGNOSIS — I779 Disorder of arteries and arterioles, unspecified: Secondary | ICD-10-CM | POA: Insufficient documentation

## 2020-05-13 DIAGNOSIS — I5022 Chronic systolic (congestive) heart failure: Secondary | ICD-10-CM | POA: Insufficient documentation

## 2020-05-13 DIAGNOSIS — M79672 Pain in left foot: Secondary | ICD-10-CM | POA: Insufficient documentation

## 2020-05-13 LAB — URINALYSIS, ROUTINE W REFLEX MICROSCOPIC
Bacteria, UA: NONE SEEN /HPF
Bilirubin Urine: NEGATIVE
Glucose, UA: NEGATIVE
Hgb urine dipstick: NEGATIVE
Hyaline Cast: NONE SEEN /LPF
Ketones, ur: NEGATIVE
Leukocytes,Ua: NEGATIVE
Nitrite: NEGATIVE
RBC / HPF: NONE SEEN /HPF (ref 0–2)
Specific Gravity, Urine: 1.03 (ref 1.001–1.035)
Squamous Epithelial / HPF: NONE SEEN /HPF (ref <=5)
WBC, UA: NONE SEEN /HPF (ref 0–5)
pH: <=5 (ref 5.0–8.0)

## 2020-05-13 LAB — URINE CULTURE
MICRO NUMBER:: 11831710
SPECIMEN QUALITY:: ADEQUATE

## 2020-05-13 LAB — MICROSCOPIC MESSAGE

## 2020-05-13 LAB — PSA: PSA: 1.65 ng/mL (ref <=4.00)

## 2020-05-13 NOTE — Assessment & Plan Note (Signed)
Only had pneumovax.  prevnar 20 given today. Reports no allergies to pneumonia vaccine.

## 2020-05-13 NOTE — Assessment & Plan Note (Signed)
Physical today 05/12/20.  Check psa today.  Colonoscopy 02/13/18 - diverticulosis and internal hemorhoids

## 2020-05-13 NOTE — Assessment & Plan Note (Signed)
Being followed by GI.  S/p recent EGD.  Colonoscopy 2020.  Follow cbc.

## 2020-05-13 NOTE — Assessment & Plan Note (Signed)
S/p EGD and dilatation.  Swallowing has been better overall.  Discussed f/u with GI if increased dysphagia.

## 2020-05-13 NOTE — Assessment & Plan Note (Signed)
Continue losartan, atenolol and imdur.  Follow pressures.  Follow metabolic panel.  

## 2020-05-13 NOTE — Assessment & Plan Note (Signed)
No evidence of volume overload on exam.  Continue imdur, beta blocker and losartan.  Stable.  

## 2020-05-13 NOTE — Assessment & Plan Note (Signed)
Continue protonix.  Acid reflux appears to be controlled.  

## 2020-05-13 NOTE — Assessment & Plan Note (Addendum)
09/14/2018 cardiac cath showed patent distal RCA stent with no significant restenosis. Stable moderate disease in the mid LAD and proximal right coronary artery. OM1 now completely occluded. It was 99% stenosed before but overall a small branch. Per note, the only difference noted from most recent angiography was progression of distal RCA disease just proximal to the stent and a tortuous segment causing about 60% stenosis. It was also noted that he had normal LV systolic function with mildly elevated left ventricular end-diastolic pressure. recent chest pain. Addition of imdur.  No further chest pain as outlined.  Tolerating 30mg  imdur.  Continue statin and risk factor modfication.

## 2020-05-13 NOTE — Assessment & Plan Note (Signed)
Pain - left lateral - under 5th toe.  Request referral to podiatry.

## 2020-05-13 NOTE — Assessment & Plan Note (Signed)
Reports intermittent issues with urgency as outlined.  Discussed prostate issues.  Will check urine to confirm no infection.

## 2020-05-13 NOTE — Assessment & Plan Note (Signed)
Followed by Dr Jefm Bryant.  Consentyx.  Stable.

## 2020-05-13 NOTE — Assessment & Plan Note (Signed)
04/17/2020 carotid showed 40 to 59% narrowing of right and left carotid arteries.  Left carotid artery was unchanged; however, the right carotid artery has progressed.  Followed by cardiology.  Continue statin medication and risk factor modification.  

## 2020-05-13 NOTE — Assessment & Plan Note (Signed)
Continue crestor.  Low cholesterol diet and exercise. Follow lipid panel and liver function tests.   

## 2020-05-13 NOTE — Assessment & Plan Note (Signed)
Low carb diet and exercise.  Follow met b and a1c.  Recent a1c elevated.  Discussed additional medication.  He feels the increase is related to limitations in diet - had to eat differently with his dysphagia issues.  Plans to adjust his diet.  Discussed other treatment including possibility of farxiga/jardiance. Concern regarding current urinary issues.  Also discussed GLP-1 - concern regarding his GI issues.  Overall appear to be doing better regarding GI concerns.  He is concerned regarding cost of some medications.  Discussed CCM referral.  Agreeable.

## 2020-05-14 ENCOUNTER — Encounter: Payer: Self-pay | Admitting: Internal Medicine

## 2020-05-14 DIAGNOSIS — R809 Proteinuria, unspecified: Secondary | ICD-10-CM | POA: Insufficient documentation

## 2020-05-15 ENCOUNTER — Telehealth: Payer: Self-pay

## 2020-05-15 ENCOUNTER — Other Ambulatory Visit: Payer: Self-pay | Admitting: Internal Medicine

## 2020-05-15 NOTE — Chronic Care Management (AMB) (Signed)
  Chronic Care Management   Note  05/15/2020 Name: Shane Mcknight MRN: [id number] DOB: [birth year]  Shane Mcknight is a 70 y.o. year old male who is a primary care patient of Einar Pheasant, MD. I reached out to [employer] by phone today in response to a referral sent by Shane Mcknight PCP, Einar Pheasant, MD     [name] was given information about Chronic Care Management services today including:  1. CCM service includes personalized support from designated clinical staff supervised by his physician, including individualized plan of care and coordination with other care providers 2. 24/7 contact phone numbers for assistance for urgent and routine care needs. 3. Service will only be billed when office clinical staff spend 20 minutes or more in a month to coordinate care. 4. Only one practitioner may furnish and bill the service in a calendar month. 5. The patient may stop CCM services at any time (effective at the end of the month) by phone call to the office staff. 6. The patient will be responsible for cost sharing (co-pay) of up to 20% of the service fee (after annual deductible is met).  Patient agreed to services and verbal consent obtained.   Follow up plan: Telephone appointment with care management team member scheduled for:06/02/2020  Noreene Larsson, [place], [place], [place] [address] Direct Dial: [phone] [email or web address] Website: [email or web address]

## 2020-05-16 ENCOUNTER — Other Ambulatory Visit: Payer: Self-pay | Admitting: Internal Medicine

## 2020-05-16 DIAGNOSIS — R3915 Urgency of urination: Secondary | ICD-10-CM

## 2020-05-16 NOTE — Progress Notes (Signed)
Urology referral placed

## 2020-05-17 ENCOUNTER — Telehealth: Payer: Self-pay | Admitting: Cardiovascular Disease

## 2020-05-17 ENCOUNTER — Other Ambulatory Visit: Payer: Self-pay | Admitting: Internal Medicine

## 2020-05-18 ENCOUNTER — Telehealth: Payer: Self-pay | Admitting: Cardiovascular Disease

## 2020-05-18 MED ORDER — ROSUVASTATIN CALCIUM 10 MG PO TABS: 5.0000 mg | ORAL_TABLET | Freq: Every day | ORAL | 1 refills | Status: DC

## 2020-05-18 NOTE — Telephone Encounter (Signed)
Rx request sent to pharmacy.  

## 2020-05-18 NOTE — Telephone Encounter (Signed)
Received fax from [facility] requesting refills for Rosuvastatin 10 mg---1/2 tab daily. Rx request sent to pharmacy.

## 2020-05-24 NOTE — Telephone Encounter (Signed)
[facility] pharmacy calling in stating that it is not recommended to take half a tablet of this medication due to the extended release factor. Pharmacy does state that this medication is not available in a 15 mg tablet and would just like clarification if provider is okay with this plan  Pharmacy states there are cancelling Rx for time being   Please advise by calling [phone] ref# [id number]  Prescribed by Elenor Quinones

## 2020-05-26 ENCOUNTER — Other Ambulatory Visit: Payer: Self-pay | Admitting: Physician Assistant

## 2020-05-26 ENCOUNTER — Ambulatory Visit: Payer: Medicare Other | Admitting: Podiatry

## 2020-05-26 ENCOUNTER — Ambulatory Visit (INDEPENDENT_AMBULATORY_CARE_PROVIDER_SITE_OTHER): Payer: Medicare Other

## 2020-05-26 ENCOUNTER — Encounter: Payer: Self-pay | Admitting: Podiatry

## 2020-05-26 ENCOUNTER — Other Ambulatory Visit: Payer: Self-pay

## 2020-05-26 DIAGNOSIS — M21622 Bunionette of left foot: Secondary | ICD-10-CM

## 2020-05-26 DIAGNOSIS — M7752 Other enthesopathy of left foot: Secondary | ICD-10-CM

## 2020-05-26 NOTE — Telephone Encounter (Signed)
Did his sx improve with reduced Imdur? If not, he can go back to his previous Imdur 30mg  as we were only doing a trial of a lower dose at the time of his visit.

## 2020-05-29 NOTE — Telephone Encounter (Signed)
Called and spoke to pt. He does confirm that decr dose of Imdur 15mg  daily has improved his SBP. His BP is now WNL and symptoms have resolved.   Called and spoke to pharmacist at [facility] and confirmed that provider does want sig to read Imdur 15mg  daily (Imdur 30mg  tablet take 1/2 tablet daily) per [facility] recc. Pharmacist confirmed this has been noted and corrected for refills.

## 2020-06-02 ENCOUNTER — Ambulatory Visit (INDEPENDENT_AMBULATORY_CARE_PROVIDER_SITE_OTHER): Payer: Medicare Other | Admitting: Pharmacist

## 2020-06-02 DIAGNOSIS — R1319 Other dysphagia: Secondary | ICD-10-CM

## 2020-06-02 DIAGNOSIS — E0822 Diabetes mellitus due to underlying condition with diabetic chronic kidney disease: Secondary | ICD-10-CM

## 2020-06-02 DIAGNOSIS — I10 Essential (primary) hypertension: Secondary | ICD-10-CM

## 2020-06-02 DIAGNOSIS — K222 Esophageal obstruction: Secondary | ICD-10-CM

## 2020-06-02 DIAGNOSIS — L4050 Arthropathic psoriasis, unspecified: Secondary | ICD-10-CM

## 2020-06-02 DIAGNOSIS — N183 Chronic kidney disease, stage 3 unspecified: Secondary | ICD-10-CM

## 2020-06-02 DIAGNOSIS — I25119 Atherosclerotic heart disease of native coronary artery with unspecified angina pectoris: Secondary | ICD-10-CM | POA: Diagnosis not present

## 2020-06-02 DIAGNOSIS — E785 Hyperlipidemia, unspecified: Secondary | ICD-10-CM

## 2020-06-02 NOTE — Chronic Care Management (AMB) (Signed)
Chronic Care Management Pharmacy Note  06/02/2020 Name:  Shane Mcknight MRN:  [id number] DOB:  08-Oct-1950  Subjective: Shane Mcknight is an 70 y.o. year old male who is a primary patient of Einar Pheasant, MD.  The CCM team was consulted for assistance with disease management and care coordination needs.    Engaged with patient by telephone for initial visit in response to provider referral for pharmacy case management and/or care coordination services.   Consent to Services:  The patient was given the following information about Chronic Care Management services today, agreed to services, and gave verbal consent: 1. CCM service includes personalized support from designated clinical staff supervised by the primary care provider, including individualized plan of care and coordination with other care providers 2. 24/7 contact phone numbers for assistance for urgent and routine care needs. 3. Service will only be billed when office clinical staff spend 20 minutes or more in a month to coordinate care. 4. Only one practitioner may furnish and bill the service in a calendar month. 5.The patient may stop CCM services at any time (effective at the end of the month) by phone call to the office staff. 6. The patient will be responsible for cost sharing (co-pay) of up to 20% of the service fee (after annual deductible is met). Patient agreed to services and consent obtained.  Patient Care Team: Einar Pheasant, MD as PCP - General (Internal Medicine) Wellington Hampshire, MD as PCP - Cardiology (Cardiology) De Hollingshead, RPH-CPP (Pharmacist)  Recent office visits:  4/29 - PCP for chronic f/u. Some increased urinary urgency. Podiatry ref for foot pain. A1c 7.2%  Recent consult visits:  4/7 - cardiology f/u CAD, reduce isosorbide d/t symptoms of lightheadedness, dizziness. Monitor BP. Continue rosuvastatin 5 mg daily. Consider Farxiga  5/13 [facility] visits: None in previous 6  months  Objective:  Lab Results  Component Value Date   CREATININE 0.85 04/19/2020   CREATININE 0.92 03/20/2020   CREATININE 0.83 12/17/2019    Lab Results  Component Value Date   HGBA1C 7.1 (H) 04/19/2020   Last diabetic Eye exam:  Lab Results  Component Value Date/Time   HMDIABEYEEXA No Retinopathy 08/26/2018 12:00 AM   HMDIABEYEEXA No Retinopathy 08/26/2018 12:00 AM    Last diabetic Foot exam:  Lab Results  Component Value Date/Time   HMDIABFOOTEX on my exam 05/12/2020 12:00 AM        Component Value Date/Time   CHOL 110 04/19/2020 0807   TRIG 88.0 04/19/2020 0807   HDL 34.20 (L) 04/19/2020 0807   CHOLHDL 3 04/19/2020 0807   VLDL 17.6 04/19/2020 0807   LDLCALC 59 04/19/2020 0807    Hepatic Function Latest Ref Rng & Units 04/19/2020 12/17/2019 07/30/2019  Total Protein 6.0 - 8.3 g/dL 7.0 6.9 6.8  Albumin 3.5 - 5.2 g/dL 4.3 4.2 4.3  AST 0 - 37 U/L _0 ALT 0 - 53 U/L _1 Alk Phosphatase 39 - 117 U/L 49 42 47  Total Bilirubin 0.2 - 1.2 mg/dL 0.4 0.5 0.4  Bilirubin, Direct 0.0 - 0.3 mg/dL 0.1 0.1 0.1    Lab Results  Component Value Date/Time   TSH 2.05 07/30/2019 08:02 AM   TSH 3.10 02/27/2018 08:23 AM    CBC Latest Ref Rng & Units 04/19/2020 03/20/2020 02/24/2020  WBC 4.0 - 10.5 K/uL 5.3 5.9 6.7  Hemoglobin 13.0 - 17.0 g/dL 12.1(L) 12.4(L) 12.6(L)  Hematocrit 39.0 - 52.0 % 35.8(L)  37.7(L) 37.5(L)  Platelets 150.0 - 400.0 K/uL 182.0 200 192.0    Lab Results  Component Value Date/Time   VD25OH 29.52 (L) 06/27/2017 10:50 AM    Clinical ASCVD: Yes    Social History   Tobacco Use  Smoking Status Former Smoker  . Types: Cigarettes  . Quit date: 07/06/1982  . Years since quitting: 37.9  Smokeless Tobacco Never Used   BP Readings from Last 3 Encounters:  05/12/20 134/70  04/20/20 140/60  03/20/20 (!) 152/84   Pulse Readings from Last 3 Encounters:  05/12/20 67  04/20/20 80  03/20/20 92   Wt Readings from Last 3 Encounters:  05/12/20 184  lb 8 oz (83.7 kg)  04/20/20 189 lb (85.7 kg)  03/20/20 183 lb (83 kg)    Assessment: Review of patient past medical history, allergies, medications, health status, including review of consultants reports, laboratory and other test data, was performed as part of comprehensive evaluation and provision of chronic care management services.   SDOH:  (Social Determinants of Health) assessments and interventions performed:  SDOH Interventions   Flowsheet Row Most Recent Value  SDOH Interventions   Financial Strain Interventions Intervention Not Indicated      CCM Care Plan  Allergies  Allergen Reactions  . Ephedrine Other (See Comments)    Causing prostate to hurt. Using for sinus bronchodilators Causing prostate to hurt. Using for sinus bronchodilators Pain in groin, prostate  . Nsaids Other (See Comments)    GI upset, history of esophagitis and Mallory-Weis tear  . Other Rash, Other (See Comments) and Nausea Only    Paper tape causes rash Plastic tape is okay GI upset, history of esophagitis and Mallory-Weis tear  . Vancomycin Itching and Rash    Same as pcn with itching  . Amoxicillin Itching and Rash  . Hyoscyamine Sulfate Other (See Comments)    Urinary retention Urine retention  . Levsin [Hyoscyamine Sulfate] Other (See Comments)    Urinary retention  . Naproxen Other (See Comments)    GI upset Also happens with other NSAIDS  GI upset Also happens with other NSAIDS GI upset  . Penicillins Itching and Rash    Has patient had a PCN reaction causing immediate rash, facial/tongue/throat swelling, SOB or lightheadedness with hypotension: yes Has patient had a PCN reaction causing severe rash involving mucus membranes or skin necrosis: no Has patient had a PCN reaction that required hospitalization: no Has patient had a PCN reaction occurring within the last 10 years: no If all of the above answers are "NO", then may proceed with Cephalosporin use.   . Shellfish Allergy  Rash    Ingested shellfish cCuses rash and itching along with stomach sickness. Pt tolerates betadine   . Sudafed [Pseudoephedrine Hcl] Other (See Comments)    Constricts prostate flow (from ephedrine d/t sinus meds)    Medications Reviewed Today    Reviewed by De Hollingshead, RPH-CPP (Pharmacist) on 06/02/20 at 1038  Med List Status: <None>  Medication Order Taking? Sig Documenting Provider Last Dose Status Informant  acetaminophen (TYLENOL) 500 MG tablet [id number] Yes Take 1,000 mg by mouth daily as needed for moderate pain. [provider] Taking Active Self  aspirin EC 81 MG tablet [id number] Yes Take 81 mg by mouth every evening.  [provider] Taking Active Self  atenolol (TENORMIN) 50 MG tablet [id number] Yes TAKE 1 TABLET BY MOUTH  TWICE DAILY Einar Pheasant, MD Taking Active   COSENTYX SENSOREADY PEN 150 MG/ML SOAJ [id number] Yes  Inject 300 mg into the skin every 30 (thirty) days.  [provider] Taking Active Self           Med Note Rowe Pavy Mar 20, 2020  2:34 PM)    cyanocobalamin (,VITAMIN B-12,) 1000 MCG/ML injection [id number] Yes Inject 1 mL (1,000 mcg total) into the muscle every 30 (thirty) days. Einar Pheasant, MD Taking Active   folic acid (FOLVITE) 1 MG tablet [id number] Yes Take 1 mg by mouth daily. [provider] Taking Active Self  gabapentin (NEURONTIN) 300 MG capsule [id number] Yes TAKE 1 TO 2 CAPSULES BY  MOUTH AT BEDTIME Einar Pheasant, MD Taking Active            Med Note Darnelle Maffucci, Arville Lime   Fri Jun 02, 2020 10:36 AM) PRN, usually QPM  isosorbide mononitrate (IMDUR) 30 MG 24 hr tablet [id number] Yes Take 0.5 tablets (15 mg total) by mouth daily. Marrianne Mood D, PA-C Taking Active   losartan (COZAAR) 25 MG tablet [id number] Yes Take 0.5 tablets (12.5 mg total) by mouth daily. Einar Pheasant, MD Taking Expired 11/19/19 2359   metFORMIN (GLUCOPHAGE) 500 MG tablet [id number] Yes TAKE 2 TABLETS BY MOUTH   TWICE DAILY WITH A MEAL Einar Pheasant, MD Taking Active   methotrexate (RHEUMATREX) 2.5 MG tablet [id number] Yes Take 8 tablets (20 mg total) by mouth once a week. Take 8 tablets every Friday. Einar Pheasant, MD Taking Active   Multiple Vitamin (MULTIVITAMIN) tablet [id number] Yes Take 1 tablet by mouth daily. [provider] Taking Active Self  nitroGLYCERIN (NITROSTAT) 0.4 MG SL tablet [id number] No Place 1 tablet (0.4 mg total) under the tongue every 5 (five) minutes as needed. MAXIMUM OF 3 DOSES.  Patient not taking: Reported on 06/02/2020   Theora Gianotti, NP Not Taking Active Self  pantoprazole (PROTONIX) 40 MG tablet [id number] Yes TAKE 1 TABLET BY MOUTH  TWICE DAILY Einar Pheasant, MD Taking Active   rosuvastatin (CRESTOR) 10 MG tablet [id number] Yes Take 0.5 tablets (5 mg total) by mouth daily. Wellington Hampshire, MD Taking Active   SYRINGE-NEEDLE, DISP, 3 ML 25G X 5/8" 3 ML MISC [id number] Yes Use as instructed with B12 injection. Einar Pheasant, MD Taking Active Self  triamcinolone cream (KENALOG) 0.5 % [id number] Yes Apply 1 application topically 2 (two) times daily as needed (scaling). [provider] Taking Active Self  vitamin E 400 UNIT capsule [id number] Yes Take 400 Units by mouth every evening. [provider] Taking Active Self          Patient Active Problem List   Diagnosis Date Noted  . Protein in urine 05/14/2020  . Carotid artery disease ([place]) 05/13/2020  . Chronic systolic heart failure ([place]) 05/13/2020  . Foot pain, left 05/13/2020  . Urinary urgency 05/12/2020  . B12 deficiency 12/18/2019  . Skin lesion of hand 11/16/2018  . Fever 09/22/2018  . Reducible left inguinal hernia 11/03/2017  . Status post carpal tunnel release of both wrists 07/14/2017  . Stricture and stenosis of esophagus   . Pre-op evaluation 06/30/2017  . Carpal tunnel syndrome on right 04/25/2017  . Bilateral hand numbness 02/26/2017  . Need for pneumococcal  vaccination 11/22/2016  . Coronary artery disease with angina pectoris ([place]) 06/14/2016  . Hyperlipidemia 06/14/2016  . Abnormal stress test   . Effort angina ([place])   . Dizziness 02/11/2016  . Food impaction of esophagus 12/03/2015  . Dysphagia 12/03/2015  . Complete rotator cuff tear of left shoulder  09/20/2015  . Left shoulder pain 08/13/2015  . Anemia 08/13/2015  . Right groin pain 04/15/2015  . Health care maintenance 07/10/2014  . Sinusitis 03/11/2014  . Abnormal liver function 10/25/2013  . History of colonic polyps 09/10/2013  . Osteoarthritis 07/06/2013  . Back pain 05/16/2013  . Elevated PSA measurement 04/25/2013  . Skin lesion 04/12/2013  . Rectal bleeding 04/12/2013  . Obstructive sleep apnea 03/16/2012  . 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) 01/30/2012  . Hypertension 01/30/2012  . Psoriatic arthritis ([place]) 01/30/2012  . Abnormal liver function tests 01/30/2012  . GERD (gastroesophageal reflux disease) 01/30/2012    Immunization History  Administered Date(s) Administered  . Janssen (J&J) SARS-COV-2 Vaccination 05/25/2019  . Moderna Sars-Covid-2 Vaccination 11/08/2019  . PNEUMOCOCCAL CONJUGATE-20 05/12/2020  . Pneumococcal Polysaccharide-23 11/22/2016  . Td 11/22/2016    Conditions to be addressed/monitored: CAD, HTN, HLD and DMII  Care Plan : Medication Management  Updates made by De Hollingshead, RPH-CPP since 06/02/2020 12:00 AM    Problem: Diabetes, CAD, HTN, HLD     Long-Range Goal: Disease Progression Prevention   Start Date: 06/02/2020  This Visit's Progress: On track  Priority: High  Note:   Current Barriers:  . Unable to achieve control of diabetes   Pharmacist Clinical Goal(s):  Marland Kitchen Over the next 90 days, patient will achieve control of diabetes as evidenced by A1c  through collaboration with PharmD and provider.   Interventions: . 1:1  collaboration with Einar Pheasant, MD regarding development and update of comprehensive plan of care as evidenced by provider attestation and co-signature . Inter-disciplinary care team collaboration (see longitudinal plan of care) . Comprehensive medication review performed; medication list updated in electronic medical record  SDOH: . 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 Uncontrolled; current treatment: metformin 1000 mg BID  . Reports dietary changes in the few weeks prior to his last A1c (deaths in the family, dysphagia impacting diet) . Current glucose readings: random readings: 120-150s . Current meal patterns: breakfast: boiled eggs, cereal, oatmeal, nuts, pimiento cheese on celery, pickles, cucumbers, fruits; lunch: leftovers from dinner; dinner: chicken (baked, grilled usually), vegetables, cutting back on meat lately d/t dysphagia; stir fry w/ vegetables; snacks: fruits, vegetables; drinks: skim or 1% milk, water, coffee, powerade; avoids fast foods, fried foods usually.  . Current exercise: tries to move a lot at work; Limits lifting due to back. Carpentry, yard work at home. Walks the dogs.  . Counseled on SGLT2, including mechanism of action, side effects, and benefits. Patient has recently increased urinary urgency with decreased stream. Urology appt scheduled 6/20. Will follow for results of that visit and determine glycemic next steps after that . Encouraged continued focus on lean proteins, fruits and vegetables, whole grains.  Marland Kitchen CGM would not be covered by insurance, but will discuss moving forward and consider cash price if unable to achieve glycemic control.  Hypertension in the setting of CAD: . Controlled at goal <130/80 at home; current treatment: losartan 12.5 mg daily (1/2 of 25 mg tablet), isosorbide mononitrate 15 mg  daily (1/2 of 30 mg tablet), atenolol 50 mg BID . Current home readings: 110s/60-70s . Denies hypertensive or hypotensive symptoms. No need for NTG lately . Continue current regimen along with cardiology collaboration. Discussed CV benefit  of [place].  Hyperlipidemia, CAD (s/p DES 2009) . Controlled at goal <70; current treatment: rosuvastatin 5 mg daily (1/2 of 10 mg tab) . Antiplatelet regimen: aspirin 81 mg daily  . Discussed that this medication is available as a 5 mg tablet. He notes that he has a supply at home he would like to use up, and that it is not a difficult tablet to split.  . Recommend to continue current regimen at this time along with cardiology collaboration  Hx esophageal stricture, hiatal hernia: . Worsening symptoms; current regimen: pantoprazole 40 mg BID; follows w/ [facility] . Reports that he had esophageal dilation 02/18/20. Reports some worsening dysphagia symptoms recently. Notes he received information from [facility] about a medication, but he never received and isn't sure about the cost. Received an email about it the other day.  . Encouraged to take a screenshot of the email and send via [place]. Notes he thought [facility] would reach out about scheduling another dilation, but they have not. I encouraged him to call to follow up.  . Continue current regimen at this time along with GI collaboration. Advised that compounded medications are rarely covered by insurance.   Psoriatic Arthritis: . Moderately well managed; current regimen: Cosentyx monthly, methotrexate 20 mg weekly, folic acid 1 mg daily; follows w/ [facility] rheumatology; triamcinolone ointment PRN . Continue current regimen at this time along with rheumatology collaboration  Supplements: Vitamin E 400 units daily, Vitamin B12 injections monthly  Patient Goals/Self-Care Activities . Over the next 90 days, patient will:  - take medications as prescribed check glucose daily, document, and provide at  future appointments collaborate with provider on medication access solutions  Follow Up Plan: Telephone follow up appointment with care management team member scheduled for: ~ 8 weeks       Medication Assistance: None required.  Patient affirms current coverage meets needs.  Patient's preferred pharmacy is:  [id number] [address], [address] AT [place] [address] Phone: [phone] Fax: [phone]  [place], [address], [address] [place], [place] [address] [place] [address] Phone: [phone] Fax: [phone]    Follow Up:  Patient agrees to Care Plan and Follow-up.  Plan: Telephone follow up appointment with care management team member scheduled for:  ~ 8 weeks  Catie Darnelle Maffucci, PharmD, [place], [place] Clinical Pharmacist [employer] at [employer] [phone]

## 2020-06-02 NOTE — Patient Instructions (Signed)
Visit Information   PATIENT GOALS:  Goals Addressed              This Visit's Progress     Patient Stated   .  Medication Monitoring (pt-stated)        Patient Goals/Self-Care Activities . Over the next 90 days, patient will:  - take medications as prescribed check glucose daily, document, and provide at future appointments collaborate with provider on medication access solutions       Consent to CCM Services: Mr. Chamberlin was given information about Chronic Care Management services today including:  1. CCM service includes personalized support from designated clinical staff supervised by his physician, including individualized plan of care and coordination with other care providers 2. 24/7 contact phone numbers for assistance for urgent and routine care needs. 3. Service will only be billed when office clinical staff spend 20 minutes or more in a month to coordinate care. 4. Only one practitioner may furnish and bill the service in a calendar month. 5. The patient may stop CCM services at any time (effective at the end of the month) by phone call to the office staff. 6. The patient will be responsible for cost sharing (co-pay) of up to 20% of the service fee (after annual deductible is met).  Patient agreed to services and verbal consent obtained.   Patient verbalizes understanding of instructions provided today and agrees to view in [place].   Plan: Telephone follow up appointment with care management team member scheduled for:  ~ 8 weeks  Catie Darnelle Maffucci, PharmD, [place], CPP Clinical Pharmacist [facility] [place]: Patient Care Plan: Medication Management    Problem Identified: Diabetes, CAD, HTN, HLD     Long-Range Goal: Disease Progression Prevention   Start Date: 06/02/2020  This Visit's Progress: On track  Priority: High  Note:   Current Barriers:  . Unable to achieve control of diabetes   Pharmacist Clinical  Goal(s):  Marland Kitchen Over the next 90 days, patient will achieve control of diabetes as evidenced by A1c  through collaboration with PharmD and provider.   Interventions: . 1:1 collaboration with Einar Pheasant, MD regarding development and update of comprehensive plan of care as evidenced by provider attestation and co-signature . Inter-disciplinary care team collaboration (see longitudinal plan of care) . Comprehensive medication review performed; medication list updated in electronic medical record  SDOH: . 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 Uncontrolled; current treatment: metformin 1000 mg BID  . Reports dietary changes in the few weeks prior to his last A1c (deaths in the family, dysphagia impacting diet) . Current glucose readings: random readings: 120-150s . Current meal patterns: breakfast: boiled eggs, cereal, oatmeal, nuts, pimiento cheese on celery, pickles, cucumbers, fruits; lunch: leftovers from dinner; dinner: chicken (baked, grilled usually), vegetables, cutting back on meat lately d/t dysphagia; stir fry w/ vegetables; snacks: fruits, vegetables; drinks: skim or 1% milk, water, coffee, powerade; avoids fast foods, fried foods usually.  . Current exercise: tries to move a lot at work; Limits lifting due to back. Carpentry, yard work at home. Walks the dogs.  . Counseled on SGLT2, including mechanism of action, side effects, and benefits. Patient has recently increased urinary urgency with decreased stream. Urology appt scheduled 6/20. Will follow for results of that visit and determine glycemic next steps after  that . Encouraged continued focus on lean proteins, fruits and vegetables, whole grains.  Marland Kitchen CGM would not be covered by insurance, but will discuss moving forward and consider cash price if unable to achieve glycemic  control.  Hypertension in the setting of CAD: . Controlled at goal <130/80 at home; current treatment: losartan 12.5 mg daily (1/2 of 25 mg tablet), isosorbide mononitrate 15 mg daily (1/2 of 30 mg tablet), atenolol 50 mg BID . Current home readings: 110s/60-70s . Denies hypertensive or hypotensive symptoms. No need for NTG lately . Continue current regimen along with cardiology collaboration. Discussed CV benefit of Farxiga.  Hyperlipidemia, CAD (s/p DES 2009) . Controlled at goal <70; current treatment: rosuvastatin 5 mg daily (1/2 of 10 mg tab) . Antiplatelet regimen: aspirin 81 mg daily  . Discussed that this medication is available as a 5 mg tablet. He notes that he has a supply at home he would like to use up, and that it is not a difficult tablet to split.  . Recommend to continue current regimen at this time along with cardiology collaboration  Hx esophageal stricture, hiatal hernia: . Worsening symptoms; current regimen: pantoprazole 40 mg BID; follows w/ [facility] . Reports that he had esophageal dilation 02/18/20. Reports some worsening dysphagia symptoms recently. Notes he received information from [facility] about a medication, but he never received and isn't sure about the cost. Received an email about it the other day.  . Encouraged to take a screenshot of the email and send via [place]. Notes he thought [facility] would reach out about scheduling another dilation, but they have not. I encouraged him to call to follow up.  . Continue current regimen at this time along with GI collaboration. Advised that compounded medications are rarely covered by insurance.   Psoriatic Arthritis: . Moderately well managed; current regimen: Cosentyx monthly, methotrexate 20 mg weekly, folic acid 1 mg daily; follows w/ [facility] rheumatology; triamcinolone ointment PRN . Continue current regimen at this time along with rheumatology collaboration  Supplements: Vitamin E 400 units daily, Vitamin  B12 injections monthly  Patient Goals/Self-Care Activities . Over the next 90 days, patient will:  - take medications as prescribed check glucose daily, document, and provide at future appointments collaborate with provider on medication access solutions  Follow Up Plan: Telephone follow up appointment with care management team member scheduled for: ~ 8 weeks

## 2020-06-12 DIAGNOSIS — M21622 Bunionette of left foot: Secondary | ICD-10-CM | POA: Diagnosis not present

## 2020-06-12 DIAGNOSIS — M7752 Other enthesopathy of left foot: Secondary | ICD-10-CM | POA: Diagnosis not present

## 2020-06-12 MED ORDER — BETAMETHASONE SOD PHOS & ACET 6 (3-3) MG/ML IJ SUSP
3.0000 mg | Freq: Once | INTRAMUSCULAR | Status: AC
  Administered 2020-06-12: 3 mg via INTRA_ARTICULAR

## 2020-06-12 NOTE — Progress Notes (Signed)
HPI: 70 y.o. 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  Patient states over the last few years it is increasingly become painful despite shoe gear adjustments.  He also applies lidocaine patches and wears bunion pads to the area with minimal relief.  Patient states that the pain is worse after working at night.  Patient still works.  Currently he is not in a position to pursue any kind of surgery but he is looking for some conservative relief options.  Past Medical History:  Diagnosis Date  . Abnormal liver function tests   . Allergic state   . Anemia   . Arthritis    spine, hands, shoulder with previous cuff tear  . CAD (coronary artery disease)    a.  11/2015 CT Chest/Abd: Coronary Ca2+ noted;  b. MV: EF 45-54%, med defect of mod severity in basal infsept, basal inf, mid infsept, and mid inf region-> infarct and peri-infarct ischemia-->intermediate risk; c. 05/2016 Cath/PCI: LM 40ost, LAD 30p, 58m, D1 60ost, LCX 60ost, OM1 99 (small->Med Rx), RCA 60p, 95d (2.5x15 Resolute Onyx DES), RPDA/RPLB min irregs, D2 60, EF 55-65%/  . Candidiasis of esophagus ([place])   . Colon polyp   . DDD (degenerative disc disease), lumbosacral   . Dysphagia   . Elevated transaminase level   . Esophageal stricture    a. s/p multiple dilations - last 03/2016.  Marland Kitchen Esophagitis, [place] grade D   . Ganglion cyst of finger of right hand   . GERD (gastroesophageal reflux disease)   . Guaiac positive stools   . Hiatal hernia   . HNP (herniated nucleus pulposus), lumbar   . Hypercholesterolemia   . Hypertension   . Lumbar radiculitis   . Mallory-Weiss tear    a. 11/2015.  . Migraine aura without headache    migraine, visual  . Psoriasis    with psoriatic arthritis  . Psoriatic arthritis (HCC)    severe  . Schatzki's ring   . Sleep apnea    does not uses CPAP. has lost weight so is much better.  . Type  II diabetes mellitus ([place])      Physical Exam: General: The patient is alert and oriented x3 in no acute distress.  Dermatology: Skin is warm, dry and supple bilateral lower extremities. Negative for open lesions or macerations.  Vascular: Palpable pedal pulses bilaterally. No edema or erythema noted. Capillary refill within normal limits.  Neurological: Epicritic and protective threshold grossly intact bilaterally.   Musculoskeletal Exam: Range of motion within normal limits to all pedal and ankle joints bilateral. Muscle strength 5/5 in all groups bilateral.  Large prominent tailor's bunion noted on clinical exam with associated tenderness to palpation range of motion of the fifth MTPJ consistent with a capsulitis of the joint  Radiographic Exam:  Normal osseous mineralization. Joint spaces preserved. No fracture/dislocation/boney destruction.  Large increased intermetatarsal angle noted between the fourth and fifth metatarsals with lateral bowing of the distal third of the fifth metatarsal head  Assessment: 1.  Tailor's bunion deformity left 2.  Fifth MTPJ capsulitis left   Plan of Care:  1. Patient evaluated. X-Rays reviewed.  2.  Today we discussed conservative and surgical treatment options for the patient.  Currently the patient is not in a position to pursue any kind of surgery, however he does have the month of December off of work and he would like to perhaps  pursue surgery at this time 3.  Injection of 0.5 cc Celestone Soluspan injection the fifth MTPJ left 4.  Continue wide fitting shoes that do not irritate the area.  Continue lidocaine patches as needed 5.  Return to clinic in October for surgical consult.  Surgery would be in the month of December.  Surgery will likely consist of either tailor's bunionectomy with fifth metatarsal osteotomy versus metatarsal head resection.      Edrick Kins, DPM [facility]  Dr. Edrick Kins, DPM    [address], [place] [address]                Office [phone]  Fax [phone]

## 2020-06-16 ENCOUNTER — Telehealth: Payer: Self-pay | Admitting: Cardiovascular Disease

## 2020-06-16 MED ORDER — ROSUVASTATIN CALCIUM 10 MG PO TABS: 10.0000 mg | ORAL_TABLET | Freq: Every day | ORAL | 1 refills | Status: DC

## 2020-06-16 NOTE — Telephone Encounter (Signed)
Please call to discuss Crestor doseage. Patient states his prescription "is all mixed up"

## 2020-06-16 NOTE — Telephone Encounter (Signed)
Yes Crestor 10 mg daily is fine.

## 2020-06-16 NOTE — Telephone Encounter (Signed)
Rx update for rosuvastatin 10 mg daily sent to [facility] R-1. Update sent to the patient through mychart as expected.

## 2020-06-16 NOTE — Telephone Encounter (Signed)
Spoke with the patient. Patient sts that he received his refill from [facility] for Crestor and it is incorrect. Patient sts that he is taking Crestor 10 mg daily. He refill received is for 1/2 tab of 10 mg to equal 5 mg daily.  His last 2 office visits have listed Crestor 10 mg take 1/2 tab daily. Patient sts that he has been taking 10 mg daily for several years.  Adv the patient that I will send a msg to Dr. Fletcher Anon to the ok to update/correct the Rx and then send the updates Rx to [facility]. Adv him to take continue taking the dosage he normally takes until he hears back. Patient rqst a mychart update msg when completed.

## 2020-07-03 ENCOUNTER — Other Ambulatory Visit: Payer: Self-pay

## 2020-07-03 ENCOUNTER — Encounter: Payer: Self-pay | Admitting: Urology

## 2020-07-03 ENCOUNTER — Ambulatory Visit: Payer: Medicare Other | Admitting: Urology

## 2020-07-03 VITALS — BP 142/80 | Ht 68.0 in | Wt 187.0 lb

## 2020-07-03 DIAGNOSIS — R350 Frequency of micturition: Secondary | ICD-10-CM | POA: Diagnosis not present

## 2020-07-03 DIAGNOSIS — R3915 Urgency of urination: Secondary | ICD-10-CM | POA: Diagnosis not present

## 2020-07-03 LAB — BLADDER SCAN AMB NON-IMAGING: Scan Result: 0

## 2020-07-03 NOTE — Progress Notes (Signed)
07/03/2020 3:08 PM   Shane Mcknight 21-Jun-1950 [id number]  Referring provider: Einar Pheasant, [address] [place],  [place] [id number]  Chief Complaint  Patient presents with   Urinary Frequency    HPI: The patient started having an urge and bladder about a month ago.  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  It is improving and now its intermittent about every 2 days.  No burning or cystitis symptoms.  He can sit in the chair for 2 to 3 hours easily.  He has had low back surgery and is on oral hypoglycemics  No history of kidney stones bladder surgery or urinary tract infection   PMH: Past Medical History:  Diagnosis Date   Abnormal liver function tests    Allergic state    Anemia    Arthritis    spine, hands, shoulder with previous cuff tear   CAD (coronary artery disease)    a.  11/2015 CT Chest/Abd: Coronary Ca2+ noted;  b. MV: EF 45-54%, med defect of mod severity in basal infsept, basal inf, mid infsept, and mid inf region-> infarct and peri-infarct ischemia-->intermediate risk; c. 05/2016 Cath/PCI: LM 40ost, LAD 30p, 29m, D1 60ost, LCX 60ost, OM1 99 (small->Med Rx), RCA 60p, 95d (2.5x15 Resolute Onyx DES), RPDA/RPLB min irregs, D2 60, EF 55-65%/   Candidiasis of esophagus (HCC)    Colon polyp    DDD (degenerative disc disease), lumbosacral    Dysphagia    Elevated transaminase level    Esophageal stricture    a. s/p multiple dilations - last 03/2016.   Esophagitis, [place] grade D    Ganglion cyst of finger of right hand    GERD (gastroesophageal reflux disease)    Guaiac positive stools    Hiatal hernia    HNP (herniated nucleus pulposus), lumbar    Hypercholesterolemia    Hypertension    Lumbar radiculitis    Mallory-Weiss tear    a. 11/2015.   Migraine aura without headache    migraine, visual   Psoriasis    with psoriatic arthritis    Psoriatic arthritis ([place])    severe   Schatzki's ring    Sleep apnea    does not uses CPAP. has lost weight so is much better.   Type II diabetes mellitus ([place])     Surgical History: Past Surgical History:  Procedure Laterality Date   ARTHROPLASTY     right thumb; left thumb with 2 screws and plastic joint   BALLOON DILATION N/A 05/29/2015   Procedure: BALLOON DILATION;  Surgeon: Lollie Sails, MD;  Location: [facility] ENDOSCOPY;  Service: Endoscopy;  Laterality: N/A;   BALLOON DILATION N/A 02/14/2017   Procedure: BALLOON DILATION;  Surgeon: Lollie Sails, MD;  Location: [facility] ENDOSCOPY;  Service: Endoscopy;  Laterality: N/A;   BILATERAL CARPAL TUNNEL RELEASE Bilateral 07/04/2017   Procedure: BILATERAL CARPAL TUNNEL RELEASE;  Surgeon: Leanor Kail, MD;  Location: [facility] ORS;  Service: Orthopedics;  Laterality: Bilateral;   BLEPHAROPLASTY     CARDIAC CATHETERIZATION     1 stent    CARPAL TUNNEL RELEASE  2005   right   CHOLECYSTECTOMY  2008   COLONOSCOPY     COLONOSCOPY WITH PROPOFOL N/A 02/13/2018   Procedure: COLONOSCOPY WITH PROPOFOL;  Surgeon: Lollie Sails, MD;  Location: [facility] ENDOSCOPY;  Service: Endoscopy;  Laterality: N/A;   CORONARY STENT  INTERVENTION N/A 06/14/2016   Procedure: Coronary Stent Intervention;  Surgeon: Wellington Hampshire, MD;  Location: [place] CV LAB;  Service: Cardiovascular;  Laterality: N/A;   CYST EXCISION  04/14/2015   tendon sheath cyst excision; right ring finger cyst removed and trigger finger realease   ESOPHAGOGASTRODUODENOSCOPY     ESOPHAGOGASTRODUODENOSCOPY (EGD) WITH PROPOFOL N/A 05/29/2015   Procedure: ESOPHAGOGASTRODUODENOSCOPY (EGD) WITH PROPOFOL;  Surgeon: Lollie Sails, MD;  Location: [facility] ENDOSCOPY;  Service: Endoscopy;  Laterality: N/A;   ESOPHAGOGASTRODUODENOSCOPY (EGD) WITH PROPOFOL N/A 07/04/2015   Procedure: ESOPHAGOGASTRODUODENOSCOPY (EGD) WITH PROPOFOL;  Surgeon: Lollie Sails, MD;  Location: [facility] ENDOSCOPY;  Service:  Endoscopy;  Laterality: N/A;   ESOPHAGOGASTRODUODENOSCOPY (EGD) WITH PROPOFOL N/A 01/04/2016   Procedure: ESOPHAGOGASTRODUODENOSCOPY (EGD) WITH PROPOFOL;  Surgeon: Lollie Sails, MD;  Location: [facility] ENDOSCOPY;  Service: Endoscopy;  Laterality: N/A;   ESOPHAGOGASTRODUODENOSCOPY (EGD) WITH PROPOFOL N/A 03/22/2016   Procedure: ESOPHAGOGASTRODUODENOSCOPY (EGD) WITH PROPOFOL;  Surgeon: Lollie Sails, MD;  Location: [facility] ENDOSCOPY;  Service: Endoscopy;  Laterality: N/A;   ESOPHAGOGASTRODUODENOSCOPY (EGD) WITH PROPOFOL N/A 02/14/2017   Procedure: ESOPHAGOGASTRODUODENOSCOPY (EGD) WITH PROPOFOL;  Surgeon: Lollie Sails, MD;  Location: [facility] ENDOSCOPY;  Service: Endoscopy;  Laterality: N/A;   ESOPHAGOGASTRODUODENOSCOPY (EGD) WITH PROPOFOL N/A 04/11/2017   Procedure: ESOPHAGOGASTRODUODENOSCOPY (EGD) WITH PROPOFOL;  Surgeon: Lollie Sails, MD;  Location: [facility] ENDOSCOPY;  Service: Endoscopy;  Laterality: N/A;   ESOPHAGOGASTRODUODENOSCOPY (EGD) WITH PROPOFOL N/A 07/07/2017   Procedure: ESOPHAGOGASTRODUODENOSCOPY (EGD) WITH PROPOFOL;  Surgeon: Lucilla Lame, MD;  Location: [facility] ENDOSCOPY;  Service: Endoscopy;  Laterality: N/A;   ESOPHAGOGASTRODUODENOSCOPY (EGD) WITH PROPOFOL N/A 02/13/2018   Procedure: ESOPHAGOGASTRODUODENOSCOPY (EGD) WITH PROPOFOL;  Surgeon: Lollie Sails, MD;  Location: [facility] ENDOSCOPY;  Service: Endoscopy;  Laterality: N/A;   EYE SURGERY     FOREIGN BODY REMOVAL N/A 12/03/2015   Procedure: FOREIGN BODY REMOVAL;  Surgeon: Wilford Corner, MD;  Location: [facility] ENDOSCOPY;  Service: Endoscopy;  Laterality: N/A;   HERNIA REPAIR Right 1970   inguinal   INGUINAL HERNIA REPAIR Left 01/01/2018   Procedure: OPEN HERNIA REPAIR INGUINAL ADULT;  Surgeon: Olean Ree, MD;  Location: [facility] ORS;  Service: General;  Laterality: Left;   LEFT HEART CATH Left 06/14/2016   Procedure: Left Heart Cath;  Surgeon: Wellington Hampshire, MD;  Location: [place] CV LAB;  Service: Cardiovascular;   Laterality: Left;   LEFT HEART CATH AND CORONARY ANGIOGRAPHY Left 09/14/2018   Procedure: LEFT HEART CATH AND CORONARY ANGIOGRAPHY;  Surgeon: Wellington Hampshire, MD;  Location: [place] CV LAB;  Service: Cardiovascular;  Laterality: Left;   LUMBAR [place] SURGERY  2004   injections; herniated disc; percutaneous discectomy   NASAL SINUS SURGERY  2008   percutaneous lumbar discectomy     ROTATOR CUFF REPAIR Right    TONSILLECTOMY      Home Medications:  Allergies as of 07/03/2020       Reactions   Ephedrine Other (See Comments)   Causing prostate to hurt. Using for sinus bronchodilators Causing prostate to hurt. Using for sinus bronchodilators Pain in groin, prostate   Nsaids Other (See Comments)   GI upset, history of esophagitis and Mallory-Weis tear   Other Rash, Other (See Comments), Nausea Only   Paper tape causes rash Plastic tape is okay GI upset, history of esophagitis and Mallory-Weis tear   Vancomycin Itching, Rash   Same as pcn with itching   Amoxicillin Itching, Rash   Hyoscyamine Sulfate Other (See Comments)   Urinary retention Urine  retention   Levsin [hyoscyamine Sulfate] Other (See Comments)   Urinary retention   Naproxen Other (See Comments)   GI upset Also happens with other NSAIDS GI upset Also happens with other NSAIDS GI upset   Penicillins Itching, Rash   Has patient had a PCN reaction causing immediate rash, facial/tongue/throat swelling, SOB or lightheadedness with hypotension: yes Has patient had a PCN reaction causing severe rash involving mucus membranes or skin necrosis: no Has patient had a PCN reaction that required hospitalization: no Has patient had a PCN reaction occurring within the last 10 years: no If all of the above answers are "NO", then may proceed with Cephalosporin use.   Shellfish Allergy Rash   Ingested shellfish cCuses rash and itching along with stomach sickness. Pt tolerates betadine    Sudafed [pseudoephedrine Hcl] Other  (See Comments)   Constricts prostate flow (from ephedrine d/t sinus meds)        Medication List        Accurate as of July 03, 2020  3:08 PM. If you have any questions, ask your nurse or doctor.          acetaminophen 500 MG tablet Commonly known as: TYLENOL Take 1,000 mg by mouth daily as needed for moderate pain.   aspirin EC 81 MG tablet Take 81 mg by mouth every evening.   atenolol 50 MG tablet Commonly known as: TENORMIN TAKE 1 TABLET BY MOUTH  TWICE DAILY   Cosentyx Sensoready Pen 150 MG/ML [place] Generic drug: Secukinumab Inject 300 mg into the skin every 30 (thirty) days.   cyanocobalamin 1000 MCG/ML injection Commonly known as: (VITAMIN B-12) Inject 1 mL (1,000 mcg total) into the muscle every 30 (thirty) days.   folic acid 1 MG tablet Commonly known as: FOLVITE Take 1 mg by mouth daily.   gabapentin 300 MG capsule Commonly known as: NEURONTIN TAKE 1 TO 2 CAPSULES BY  MOUTH AT BEDTIME   isosorbide mononitrate 30 MG 24 hr tablet Commonly known as: IMDUR Take 0.5 tablets (15 mg total) by mouth daily.   losartan 25 MG tablet Commonly known as: COZAAR Take 0.5 tablets (12.5 mg total) by mouth daily.   metFORMIN 500 MG tablet Commonly known as: GLUCOPHAGE TAKE 2 TABLETS BY MOUTH  TWICE DAILY WITH A MEAL   methotrexate 2.5 MG tablet Commonly known as: RHEUMATREX Take 8 tablets (20 mg total) by mouth once a week. Take 8 tablets every Friday.   multivitamin tablet Take 1 tablet by mouth daily.   nitroGLYCERIN 0.4 MG SL tablet Commonly known as: NITROSTAT Place 1 tablet (0.4 mg total) under the tongue every 5 (five) minutes as needed. MAXIMUM OF 3 DOSES.   pantoprazole 40 MG tablet Commonly known as: PROTONIX TAKE 1 TABLET BY MOUTH  TWICE DAILY   rosuvastatin 10 MG tablet Commonly known as: CRESTOR Take 1 tablet (10 mg total) by mouth daily.   SYRINGE-NEEDLE (DISP) 3 ML 25G X 5/8" 3 ML Misc Use as instructed with B12 injection.    triamcinolone cream 0.5 % Commonly known as: KENALOG Apply 1 application topically 2 (two) times daily as needed (scaling).   vitamin E 180 MG (400 UNITS) capsule Take 400 Units by mouth every evening.        Allergies:  Allergies  Allergen Reactions   Ephedrine Other (See Comments)    Causing prostate to hurt. Using for sinus bronchodilators Causing prostate to hurt. Using for sinus bronchodilators Pain in groin, prostate   Nsaids Other (See Comments)  GI upset, history of esophagitis and Mallory-Weis tear   Other Rash, Other (See Comments) and Nausea Only    Paper tape causes rash Plastic tape is okay GI upset, history of esophagitis and Mallory-Weis tear   Vancomycin Itching and Rash    Same as pcn with itching   Amoxicillin Itching and Rash   Hyoscyamine Sulfate Other (See Comments)    Urinary retention Urine retention   Levsin [Hyoscyamine Sulfate] Other (See Comments)    Urinary retention   Naproxen Other (See Comments)    GI upset Also happens with other NSAIDS  GI upset Also happens with other NSAIDS GI upset   Penicillins Itching and Rash    Has patient had a PCN reaction causing immediate rash, facial/tongue/throat swelling, SOB or lightheadedness with hypotension: yes Has patient had a PCN reaction causing severe rash involving mucus membranes or skin necrosis: no Has patient had a PCN reaction that required hospitalization: no Has patient had a PCN reaction occurring within the last 10 years: no If all of the above answers are "NO", then may proceed with Cephalosporin use.    Shellfish Allergy Rash    Ingested shellfish cCuses rash and itching along with stomach sickness. Pt tolerates betadine    Sudafed [Pseudoephedrine Hcl] Other (See Comments)    Constricts prostate flow (from ephedrine d/t sinus meds)    Family History: Family History  Problem Relation Age of Onset   Lung cancer Father    Cancer Father    Hypertension Mother    Aneurysm  Son     Social History:  reports that he quit smoking about 38 years ago. His smoking use included cigarettes. He has never used smokeless tobacco. He reports current alcohol use. He reports that he does not use drugs.  ROS:                                        Physical Exam: There were no vitals taken for this visit.  Constitutional:  Alert and oriented, No acute distress. HEENT: [place] AT, moist mucus membranes.  Trachea midline, no masses. Cardiovascular: No clubbing, cyanosis, or edema. Respiratory: Normal respiratory effort, no increased work of breathing. GI: Abdomen is soft, nontender, nondistended, no abdominal masses GU: No CVA tenderness.  50 g benign prostate Skin: No rashes, bruises or suspicious lesions. Lymph: No cervical or inguinal adenopathy. Neurologic: Grossly intact, no focal deficits, moving all 4 extremities. Psychiatric: Normal mood and affect.  Laboratory Data: Lab Results  Component Value Date   WBC 5.3 04/19/2020   HGB 12.1 (L) 04/19/2020   HCT 35.8 (L) 04/19/2020   MCV 83.9 04/19/2020   PLT 182.0 04/19/2020    Lab Results  Component Value Date   CREATININE 0.85 04/19/2020    Lab Results  Component Value Date   PSA 1.65 05/12/2020   PSA 1.82 02/26/2019   PSA 1.5 03/13/2018    No results found for: TESTOSTERONE  Lab Results  Component Value Date   HGBA1C 7.1 (H) 04/19/2020    Urinalysis    Component Value Date/Time   COLORURINE YELLOW 05/12/2020 1651   APPEARANCEUR CLEAR 05/12/2020 1651   LABSPEC 1.030 05/12/2020 1651   PHURINE < OR = 5.0 05/12/2020 1651   GLUCOSEU NEGATIVE 05/12/2020 1651   HGBUR NEGATIVE 05/12/2020 1651   BILIRUBINUR NEGATIVE 09/22/2018 2333   KETONESUR NEGATIVE 05/12/2020 1651   PROTEINUR 1+ (A) 05/12/2020 1651  NITRITE NEGATIVE 05/12/2020 1651   LEUKOCYTESUR NEGATIVE 05/12/2020 1651    Pertinent Imaging: Urine reviewed.  Post void residual 10 mL urine sent for culture.  Normal  urine  Assessment & Plan: Urgency discussed.  Role of alpha-blocker and other medications discussed.  Watchful waiting discussed.  Call if urine culture positive  Patient shows as needed.  He might try an over-the-counter product  1. Urinary urgency  - Urinalysis, Complete   No follow-ups on file.  Reece Packer, MD  [facility] [address], [place], [place] [address] [phone]

## 2020-07-04 LAB — URINALYSIS, COMPLETE
Bilirubin, UA: NEGATIVE
Glucose, UA: NEGATIVE
Leukocytes,UA: NEGATIVE
Nitrite, UA: NEGATIVE
RBC, UA: NEGATIVE
Specific Gravity, UA: >1.03 — ABNORMAL HIGH (ref 1.005–1.030)
Urobilinogen, Ur: 0.2 mg/dL (ref 0.2–1.0)
pH, UA: 5 (ref 5.0–7.5)

## 2020-07-04 LAB — MICROSCOPIC EXAMINATION: Bacteria, UA: NONE SEEN

## 2020-07-15 ENCOUNTER — Encounter: Payer: Self-pay | Admitting: Internal Medicine

## 2020-07-15 DIAGNOSIS — I5022 Chronic systolic (congestive) heart failure: Secondary | ICD-10-CM

## 2020-07-19 NOTE — Telephone Encounter (Signed)
Please confirm if he has been taking regularly. How are his blood pressures doin? Per review, it appears that cardiology has been adjusting his medication (imdur dose, etc).  Per my review, the losartan was decreased to 1/2 (25mg  tablet) q day.  I do not see where anyone stopped it, but he can clarify with cardiology as well to confirm.  If he has been taking regularly and blood pressure doing ok and he is not aware of anyone telling him to stop (or no one told him to stop) - ok to refill losartan 25mg  1/2 tablet q day.

## 2020-07-21 MED ORDER — LOSARTAN POTASSIUM 25 MG PO TABS: 12.5000 mg | ORAL_TABLET | Freq: Every day | ORAL | 1 refills | Status: DC

## 2020-07-21 NOTE — Telephone Encounter (Signed)
I called and spoke with the patient and he stated his BP has been good sometimes he has been a little high but not daily.  He stated no one told him to stop his Losartan and I sent the RX for Losartan to his mail order with 1 refill.  Reign Bartnick,cma

## 2020-08-04 ENCOUNTER — Telehealth: Payer: Self-pay | Admitting: Cardiovascular Disease

## 2020-08-04 NOTE — Telephone Encounter (Signed)
Spoke to pt notified of Dr. Tyrell Antonio recc.  Pt voiced understanding.   Pt states he received notification it was time to schedule next follow up appt.  Will forward to scheduling to contact pt.

## 2020-08-04 NOTE — Telephone Encounter (Signed)
He should monitor symptoms for now and let us know if he has recurrent chest pain especially with exertion.  He should call 911 if chest pain does not resolve after 1 sublingual nitroglycerin.

## 2020-08-04 NOTE — Telephone Encounter (Signed)
Spoke to pt. He reports he had one episode of chest pain last night. "I have not had any chest pain in quite a while." Pt did report making homemade spaghetti last night and had to take Tums for stomach discomfort / reflux.  After dinner, pt developed chest pain then felt weak and dizzy and had to sit down.  Took BP immediately and was 133/43 HR 88 (while feeling dizzy).  Pt took SL NTG x 1 and chest pain resolved.  Denies SOB at time of chest pain.   Pt does have h/o esophageal spasms but has not had trouble recently per pt.  Last office visit with Marrianne Mood, PA 04/20/20.  At last ov Imdur was decr from '30mg'$  to '15mg'$  d/t headache and dizziness symptoms.   Pt is currently at work now and reports he is feeling well.  Denies chest pain, shortness of breath at this time.  Notified pt I will make Dr. Fletcher Anon aware and will call with further recc.  Advised pt if develops chest pain in the meantime or over weekend, not resolved after taking NTG x 3 to call 911.  Pt voiced understanding.

## 2020-08-04 NOTE — Telephone Encounter (Signed)
Pt c/o of Chest Pain: STAT if CP now or developed within 24 hours  1. Are you having CP right now? No, but had tightness last night   2. Are you experiencing any other symptoms (ex. SOB, nausea, vomiting, sweating)? SOB, dizziness, Blood pressure was 133/63. States he had to lie down, the room was spinning.   3. How long have you been experiencing CP? Just last night., lasted for about   4. Is your CP continuous or coming and going?  Pressure on chest, continual.   5. Have you taken Nitroglycerin? Yes, last night ?  Patient is working today, no chest tightness, no dizziness this morning. Blood pressure this morning was 145/65

## 2020-08-11 ENCOUNTER — Telehealth: Payer: Medicare Other

## 2020-08-15 ENCOUNTER — Ambulatory Visit (INDEPENDENT_AMBULATORY_CARE_PROVIDER_SITE_OTHER): Payer: Medicare Other | Admitting: Pharmacist

## 2020-08-15 DIAGNOSIS — I25119 Atherosclerotic heart disease of native coronary artery with unspecified angina pectoris: Secondary | ICD-10-CM

## 2020-08-15 DIAGNOSIS — E785 Hyperlipidemia, unspecified: Secondary | ICD-10-CM

## 2020-08-15 DIAGNOSIS — L4050 Arthropathic psoriasis, unspecified: Secondary | ICD-10-CM

## 2020-08-15 DIAGNOSIS — N183 Chronic kidney disease, stage 3 unspecified: Secondary | ICD-10-CM | POA: Diagnosis not present

## 2020-08-15 DIAGNOSIS — I10 Essential (primary) hypertension: Secondary | ICD-10-CM

## 2020-08-15 DIAGNOSIS — E0822 Diabetes mellitus due to underlying condition with diabetic chronic kidney disease: Secondary | ICD-10-CM

## 2020-08-15 NOTE — Patient Instructions (Signed)
Visit Information  PATIENT GOALS:  Goals Addressed               This Visit's Progress     Patient Stated     COMPLETED: Medication Monitoring (pt-stated)        Patient Goals/Self-Care Activities Over the next 90 days, patient will:  - take medications as prescribed check glucose daily, document, and provide at future appointments collaborate with provider on medication access solutions         Patient verbalizes understanding of instructions provided today and agrees to view in [place].    Plan: Patient has my contact information for future questions or concerns.  Catie Darnelle Maffucci, PharmD, [place], [place] Clinical Pharmacist [employer] at [place]

## 2020-08-15 NOTE — Chronic Care Management (AMB) (Signed)
Chronic Care Management Pharmacy Note  08/15/2020 Name:  Shane Mcknight MRN:  [id number] DOB:  11-13-1950  Subjective: Shane Mcknight is an 70 y.o. year old male who is a primary patient of Einar Pheasant, MD.  The CCM team was consulted for assistance with disease management and care coordination needs.    Engaged with patient by telephone for follow up visit in response to provider referral for pharmacy case management and/or care coordination services.   Consent to Services:  The patient was given information about Chronic Care Management services, agreed to services, and gave verbal consent prior to initiation of services.  Please see initial visit note for detailed documentation.   Patient Care Team: Einar Pheasant, MD as PCP - General (Internal Medicine) Wellington Hampshire, MD as PCP - Cardiology (Cardiology) De Hollingshead, RPH-CPP (Pharmacist)   Objective:  Lab Results  Component Value Date   CREATININE 0.85 04/19/2020   CREATININE 0.92 03/20/2020   CREATININE 0.83 12/17/2019    Lab Results  Component Value Date   HGBA1C 7.1 (H) 04/19/2020   Last diabetic Eye exam:  Lab Results  Component Value Date/Time   HMDIABEYEEXA No Retinopathy 08/26/2018 12:00 AM   HMDIABEYEEXA No Retinopathy 08/26/2018 12:00 AM    Last diabetic Foot exam:  Lab Results  Component Value Date/Time   HMDIABFOOTEX on my exam 05/12/2020 12:00 AM        Component Value Date/Time   CHOL 110 04/19/2020 0807   TRIG 88.0 04/19/2020 0807   HDL 34.20 (L) 04/19/2020 0807   CHOLHDL 3 04/19/2020 0807   VLDL 17.6 04/19/2020 0807   LDLCALC 59 04/19/2020 0807    Hepatic Function Latest Ref Rng & Units 04/19/2020 12/17/2019 07/30/2019  Total Protein 6.0 - 8.3 g/dL 7.0 6.9 6.8  Albumin 3.5 - 5.2 g/dL 4.3 4.2 4.3  AST 0 - 37 U/L '13 13 14  ' ALT 0 - 53 U/L '14 13 20  ' Alk Phosphatase 39 - 117 U/L 49 42 47  Total Bilirubin 0.2 - 1.2 mg/dL 0.4 0.5 0.4  Bilirubin, Direct 0.0 - 0.3 mg/dL 0.1 0.1 0.1     Lab Results  Component Value Date/Time   TSH 2.05 07/30/2019 08:02 AM   TSH 3.10 02/27/2018 08:23 AM    CBC Latest Ref Rng & Units 04/19/2020 03/20/2020 02/24/2020  WBC 4.0 - 10.5 K/uL 5.3 5.9 6.7  Hemoglobin 13.0 - 17.0 g/dL 12.1(L) 12.4(L) 12.6(L)  Hematocrit 39.0 - 52.0 % 35.8(L) 37.7(L) 37.5(L)  Platelets 150.0 - 400.0 K/uL 182.0 200 192.0    Lab Results  Component Value Date/Time   VD25OH 29.52 (L) 06/27/2017 10:50 AM    Clinical ASCVD: Yes  The ASCVD Risk score Mikey Bussing DC Jr., et al., 2013) failed to calculate for the following reasons:   The valid total cholesterol range is 130 to 320 mg/dL      Social History   Tobacco Use  Smoking Status Former   Types: Cigarettes   Quit date: 07/06/1982   Years since quitting: 38.1  Smokeless Tobacco Never   BP Readings from Last 3 Encounters:  07/03/20 (!) 142/80  05/12/20 134/70  04/20/20 140/60   Pulse Readings from Last 3 Encounters:  05/12/20 67  04/20/20 80  03/20/20 92   Wt Readings from Last 3 Encounters:  07/03/20 187 lb (84.8 kg)  05/12/20 184 lb 8 oz (83.7 kg)  04/20/20 189 lb (85.7 kg)    Assessment: Review of patient past medical history, allergies, medications, health  status, including review of consultants reports, laboratory and other test data, was performed as part of comprehensive evaluation and provision of chronic care management services.   SDOH:  (Social Determinants of Health) assessments and interventions performed:  SDOH Interventions    Flowsheet Row Most Recent Value  SDOH Interventions   Financial Strain Interventions Intervention Not Indicated       CCM Care Plan  Allergies  Allergen Reactions   Ephedrine Other (See Comments)    Causing prostate to hurt. Using for sinus bronchodilators Causing prostate to hurt. Using for sinus bronchodilators Pain in groin, prostate   Nsaids Other (See Comments)    GI upset, history of esophagitis and Mallory-Weis tear   Other Rash, Other  (See Comments) and Nausea Only    Paper tape causes rash Plastic tape is okay GI upset, history of esophagitis and Mallory-Weis tear   Vancomycin Itching and Rash    Same as pcn with itching   Amoxicillin Itching and Rash   Hyoscyamine Sulfate Other (See Comments)    Urinary retention Urine retention   Levsin [Hyoscyamine Sulfate] Other (See Comments)    Urinary retention   Naproxen Other (See Comments)    GI upset Also happens with other NSAIDS  GI upset Also happens with other NSAIDS GI upset   Penicillins Itching and Rash    Has patient had a PCN reaction causing immediate rash, facial/tongue/throat swelling, SOB or lightheadedness with hypotension: yes Has patient had a PCN reaction causing severe rash involving mucus membranes or skin necrosis: no Has patient had a PCN reaction that required hospitalization: no Has patient had a PCN reaction occurring within the last 10 years: no If all of the above answers are "NO", then may proceed with Cephalosporin use.    Shellfish Allergy Rash    Ingested shellfish cCuses rash and itching along with stomach sickness. Pt tolerates betadine    Sudafed [Pseudoephedrine Hcl] Other (See Comments)    Constricts prostate flow (from ephedrine d/t sinus meds)    Medications Reviewed Today     Reviewed by De Hollingshead, RPH-CPP (Pharmacist) on 08/15/20 at 1218  Med List Status: <None>   Medication Order Taking? Sig Documenting Provider Last Dose Status Informant  acetaminophen (TYLENOL) 500 MG tablet [id number] Yes Take 1,000 mg by mouth daily as needed for moderate pain. [provider] Taking Active Self  aspirin EC 81 MG tablet [id number] Yes Take 81 mg by mouth every evening.  [provider] Taking Active Self  atenolol (TENORMIN) 50 MG tablet [id number] Yes TAKE 1 TABLET BY MOUTH  TWICE DAILY Einar Pheasant, MD Taking Active   COSENTYX SENSOREADY PEN 150 MG/ML SOAJ [id number] Yes Inject 300 mg into the skin every  30 (thirty) days.  [provider] Taking Active Self           Med Note Shane Mcknight Mar 20, 2020  2:34 PM)    cyanocobalamin (,VITAMIN B-12,) 1000 MCG/ML injection [id number] Yes Inject 1 mL (1,000 mcg total) into the muscle every 30 (thirty) days. Einar Pheasant, MD Taking Active   folic acid (FOLVITE) 1 MG tablet [id number] Yes Take 1 mg by mouth daily. [provider] Taking Active Self  gabapentin (NEURONTIN) 300 MG capsule [id number] Yes TAKE 1 TO 2 CAPSULES BY  MOUTH AT BEDTIME Einar Pheasant, MD Taking Active            Med Note Shane Mcknight, Arville Lime   Fri Jun 02, 2020 10:36 AM) PRN,  usually QPM  isosorbide mononitrate (IMDUR) 30 MG 24 hr tablet [id number] Yes Take 0.5 tablets (15 mg total) by mouth daily. Marrianne Mood D, PA-C Taking Active   losartan (COZAAR) 25 MG tablet [id number] Yes Take 0.5 tablets (12.5 mg total) by mouth daily. Einar Pheasant, MD Taking Active   metFORMIN (GLUCOPHAGE) 500 MG tablet [id number] Yes TAKE 2 TABLETS BY MOUTH  TWICE DAILY WITH A MEAL Einar Pheasant, MD Taking Active   methotrexate (RHEUMATREX) 2.5 MG tablet [id number] Yes Take 8 tablets (20 mg total) by mouth once a week. Take 8 tablets every Friday. Einar Pheasant, MD Taking Active   Multiple Vitamin (MULTIVITAMIN) tablet [id number] Yes Take 1 tablet by mouth daily. [provider] Taking Active Self  nitroGLYCERIN (NITROSTAT) 0.4 MG SL tablet [id number]  Place 1 tablet (0.4 mg total) under the tongue every 5 (five) minutes as needed. MAXIMUM OF 3 DOSES. Theora Gianotti, NP  Active   pantoprazole (PROTONIX) 40 MG tablet [id number] Yes TAKE 1 TABLET BY MOUTH  TWICE DAILY Einar Pheasant, MD Taking Active   rosuvastatin (CRESTOR) 10 MG tablet [id number] Yes Take 1 tablet (10 mg total) by mouth daily. Wellington Hampshire, MD Taking Active   SYRINGE-NEEDLE, DISP, 3 ML 25G X 5/8" 3 ML MISC [id number] Yes Use as instructed with B12 injection. Einar Pheasant, MD Taking  Active Self  triamcinolone cream (KENALOG) 0.5 % [id number] Yes Apply 1 application topically 2 (two) times daily as needed (scaling). [provider] Taking Active Self  vitamin E 400 UNIT capsule [id number] Yes Take 400 Units by mouth every evening. [provider] Taking Active Self            Patient Active Problem List   Diagnosis Date Noted   Protein in urine 05/14/2020   Carotid artery disease ([place]) 37/62/8315   Chronic systolic heart failure ([place]) 05/13/2020   Foot pain, left 05/13/2020   Urinary urgency 05/12/2020   B12 deficiency 12/18/2019   Skin lesion of hand 11/16/2018   Fever 09/22/2018   Reducible left inguinal hernia 11/03/2017   Status post carpal tunnel release of both wrists 07/14/2017   Stricture and stenosis of esophagus    Pre-op evaluation 06/30/2017   Carpal tunnel syndrome on right 04/25/2017   Bilateral hand numbness 02/26/2017   Need for pneumococcal vaccination 11/22/2016   Coronary artery disease with angina pectoris ([place]) 06/14/2016   Hyperlipidemia 06/14/2016   Abnormal stress test    Effort angina ([place])    Dizziness 02/11/2016   Food impaction of esophagus 12/03/2015   Dysphagia 12/03/2015   Complete rotator cuff tear of left shoulder 09/20/2015   Left shoulder pain 08/13/2015   Anemia 08/13/2015   Right groin pain 04/15/2015   Health care maintenance 07/10/2014   Sinusitis 03/11/2014   Abnormal liver function 10/25/2013   History of colonic polyps 09/10/2013   Osteoarthritis 07/06/2013   Back pain 05/16/2013   Elevated PSA measurement 04/25/2013   Skin lesion 04/12/2013   Rectal bleeding 04/12/2013   Obstructive sleep apnea 03/16/2012   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, without long-term current use of insulin ([place]) 01/30/2012   Hypertension 01/30/2012   Psoriatic arthritis ([place]) 01/30/2012   Abnormal liver function tests 01/30/2012   GERD (gastroesophageal reflux  disease) 01/30/2012    Immunization History  Administered Date(s) Administered   Janssen (J&J) SARS-COV-2 Vaccination 05/25/2019   Moderna Sars-Covid-2 Vaccination 11/08/2019   PNEUMOCOCCAL CONJUGATE-20 05/12/2020   Pneumococcal Polysaccharide-23 11/22/2016  Td 11/22/2016    Conditions to be addressed/monitored: CAD, HTN, HLD, and DMII  Care Plan : Medication Management  Updates made by De Hollingshead, RPH-CPP since 08/15/2020 12:00 AM  Completed 08/15/2020   Problem: Diabetes, CAD, HTN, HLD Resolved 08/15/2020     Long-Range Goal: Disease Progression Prevention Completed 08/15/2020  Start Date: 06/02/2020  This Visit's Progress: On track  Recent Progress: On track  Priority: High  Note:   Current Barriers:  Unable to achieve control of diabetes   Pharmacist Clinical Goal(s):  Over the next 90 days, patient will achieve control of diabetes as evidenced by A1c  through collaboration with PharmD and provider.   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 Patient declined addition of medication at that time, preferred to focus on dietary modifications.   Due for PCP follow up. Offered to schedule. He declined, noting he would call the office back to schedule.   Hypertension in the setting of  CAD: Controlled at goal <130/80 at home; current treatment: losartan 12.5 mg daily (1/2 of 25 mg tablet), isosorbide mononitrate 15 mg daily (1/2 of 30 mg tablet), atenolol 50 mg BID Current home readings: 120-130s/70-80 Reported one episode of chest pain requiring nitroglycerin, but in retrospect, believes it was GI related as he had spaghetti that evening.  Continue current regimen along with cardiology collaboration.   Hyperlipidemia, CAD (s/p DES 2009) Controlled at goal <70; current treatment: rosuvastatin 10 mg daily- previously noted he was taking 1/2 of a 10 mg daily, but noted to Dr. Fletcher Anon that he had been taking a whole tablet. Requested a script for 10 mg.  Antiplatelet regimen: aspirin 81 mg daily  Recommend to continue current regimen at this time along with cardiology collaboration  Hx esophageal stricture, hiatal hernia: Worsening symptoms; current regimen: pantoprazole 40 mg BID; follows w/ [facility] Continue current regimen at this time along with GI collaboration.   Psoriatic Arthritis: Moderately well managed; current regimen: Cosentyx monthly, methotrexate 20 mg weekly, folic acid 1 mg daily; follows w/ [facility] rheumatology; triamcinolone ointment PRN Reports he has been out of folic acid 1 mg for about a week, but plans to pick up more. Continue current regimen at this time along with rheumatology collaboration  Supplements: Vitamin E 400 units daily, Vitamin B12 injections monthly  Patient Goals/Self-Care Activities Over the next 90 days, patient will:  - take medications as prescribed check glucose daily, document, and provide at future appointments collaborate with provider on medication access solutions  Follow Up Plan: Patient declines follow up       Medication Assistance: None required.  Patient affirms current coverage meets needs.  Patient's preferred pharmacy is:  [id number] [address], [place] AT [facility] [address] [place] [address] Phone: [phone] Fax: [phone]  [facility]  [facility]  Delivery) - [place], [address] [place] [address] Phone: [phone] Fax: [phone]   Follow Up:  Patient requests no follow-up at this time.  Plan: Patient has my contact information for future questions or concerns.  Shane Mcknight, PharmD, [place], [place] Clinical Pharmacist [employer] [facility] [place]

## 2020-08-22 ENCOUNTER — Other Ambulatory Visit: Payer: Self-pay | Admitting: Internal Medicine

## 2020-08-24 LAB — HM DIABETES EYE EXAM

## 2020-08-28 ENCOUNTER — Telehealth: Payer: Self-pay | Admitting: Internal Medicine

## 2020-08-28 NOTE — Telephone Encounter (Signed)
Received the Retinal Scan from [facility] I have placed in your results folder for review and patient notification and to be scanned to chart . Please abstract.

## 2020-08-29 NOTE — Telephone Encounter (Signed)
Will review when return to office.  Will need to abstract.

## 2020-08-29 NOTE — Telephone Encounter (Signed)
Will abstract after review

## 2020-09-05 IMAGING — US US EXTREM LOW*L* LIMITED
1 series · 14 of 25 positions shown · non-contrast
Comparison: None.

CLINICAL DATA: Left inguinal region pain since inguinal hernia
repair on 01/01/2018.

EXAM:
ULTRASOUND LEFT LOWER EXTREMITY LIMITED
TECHNIQUE: Ultrasound examination of the lower extremity soft tissues was
performed in the area of clinical concern.

[Series 1: us extrem low*left* limited · 29 acquisitions, 14 frames shown]
[im 1/29]
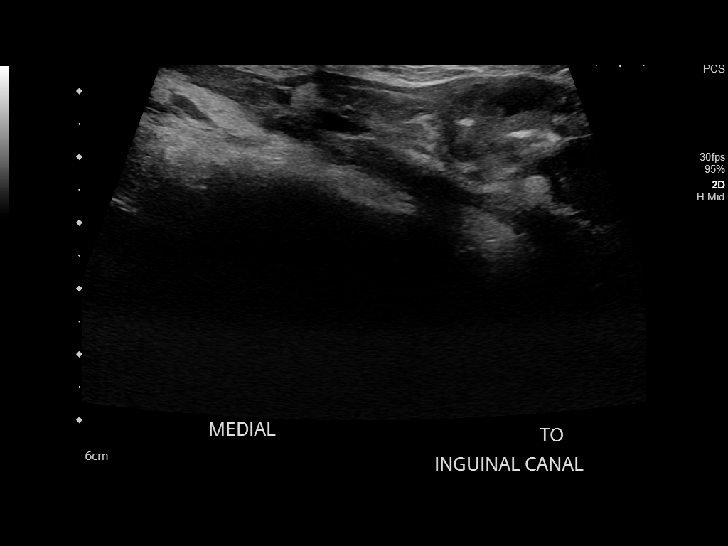
[im 3/29]
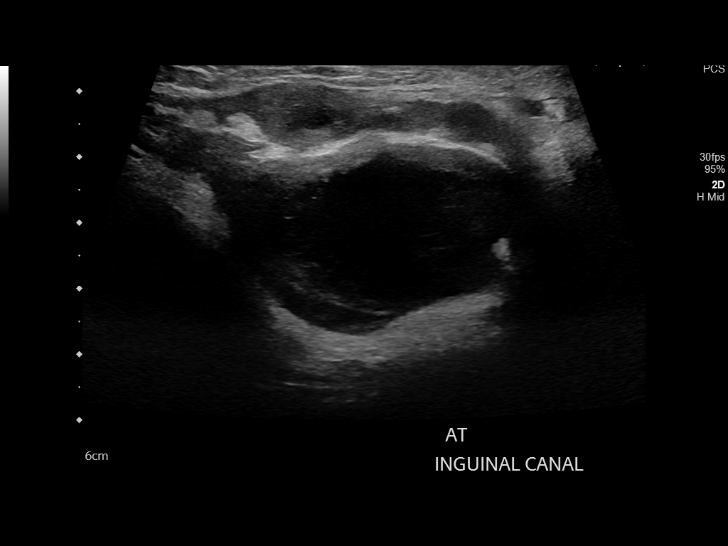
[im 5/29]
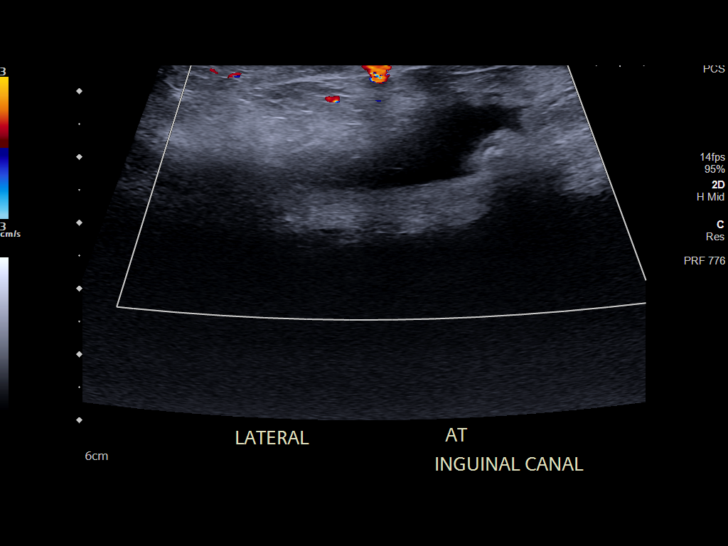
[im 8/29]
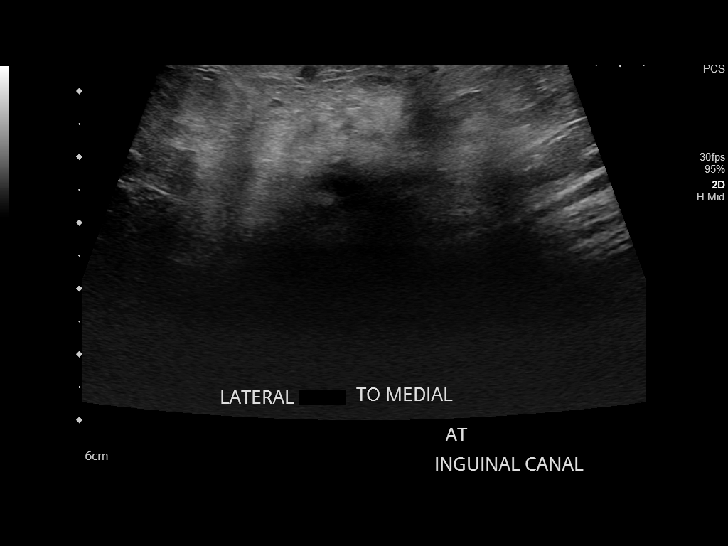
[im 10/29]
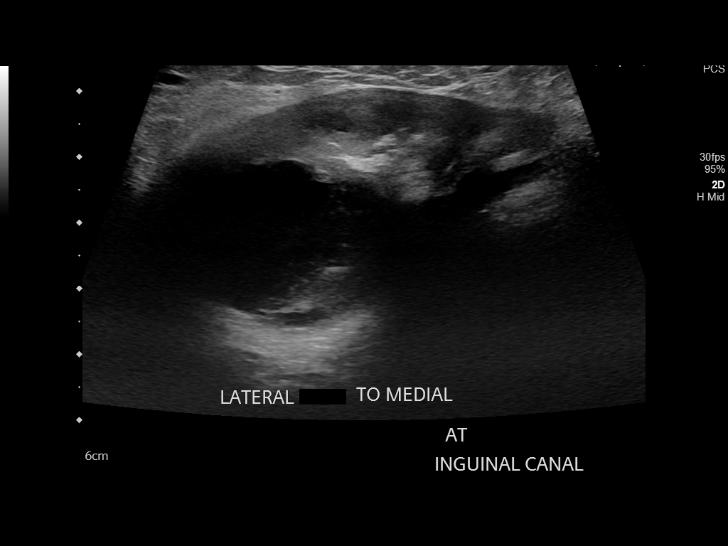
[im 11/29]
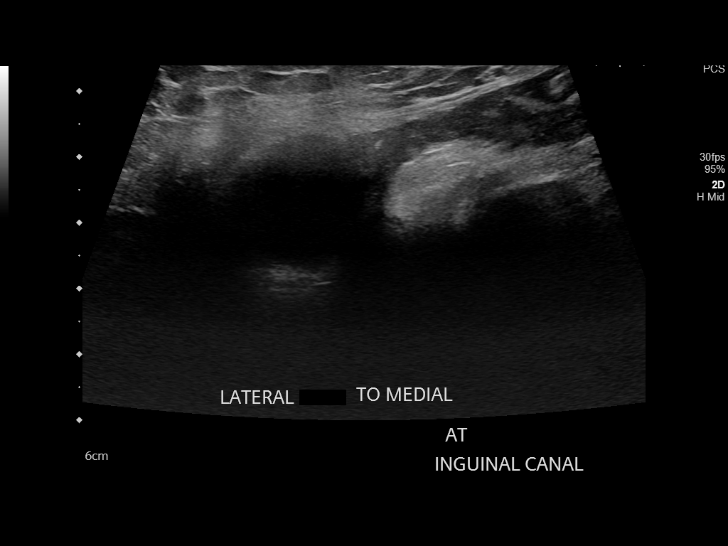
[im 13/29]
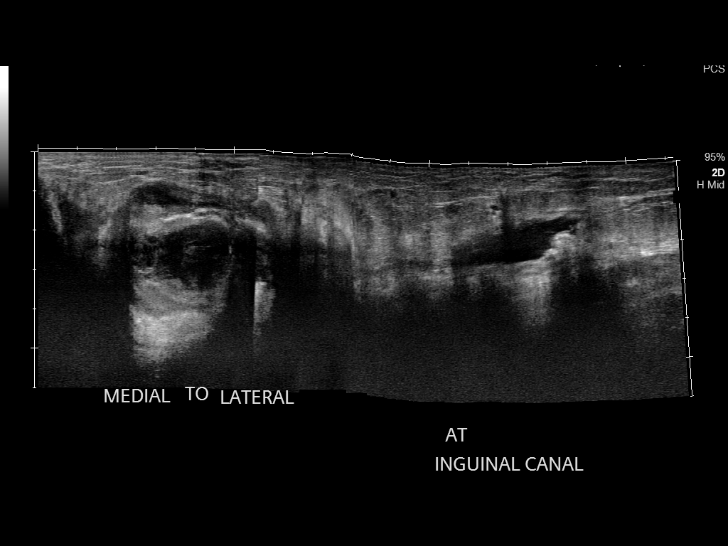
[im 16/29]
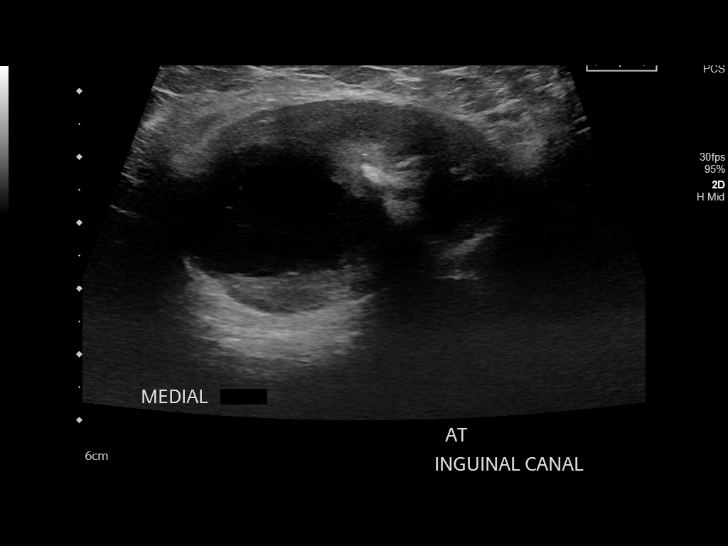
[im 18/29]
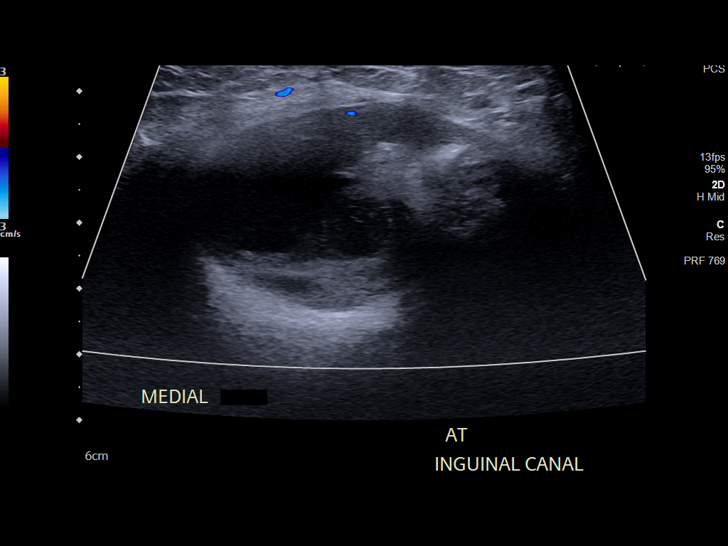
[im 19/29]
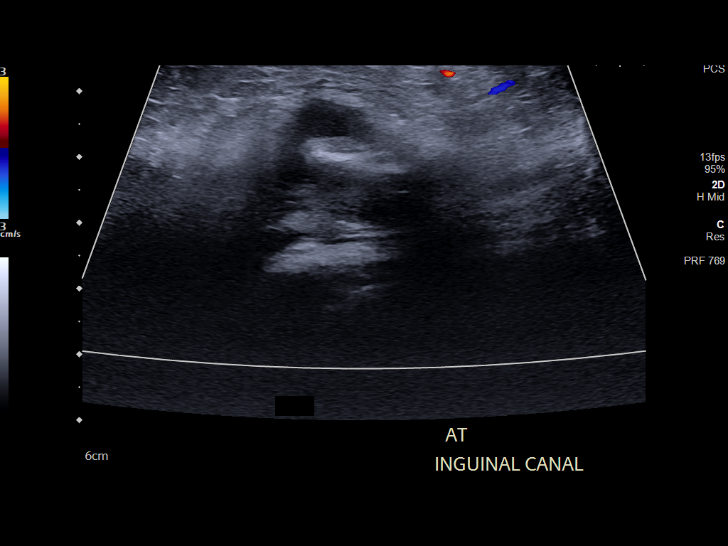
[im 22/29]
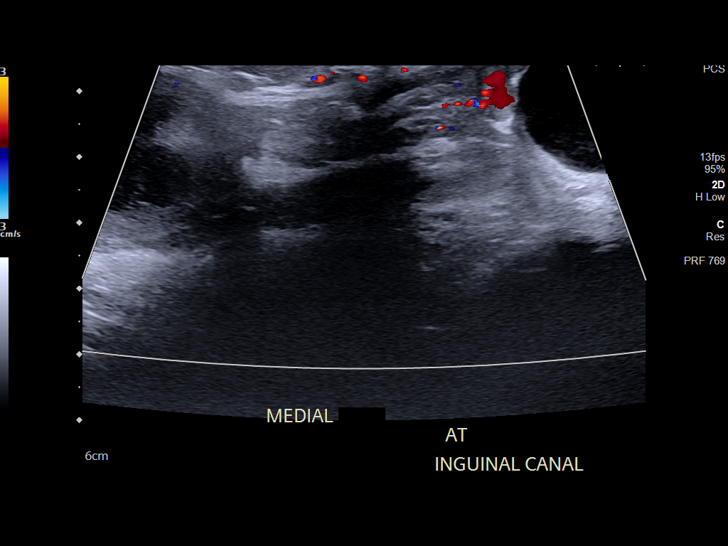
[im 24/29]
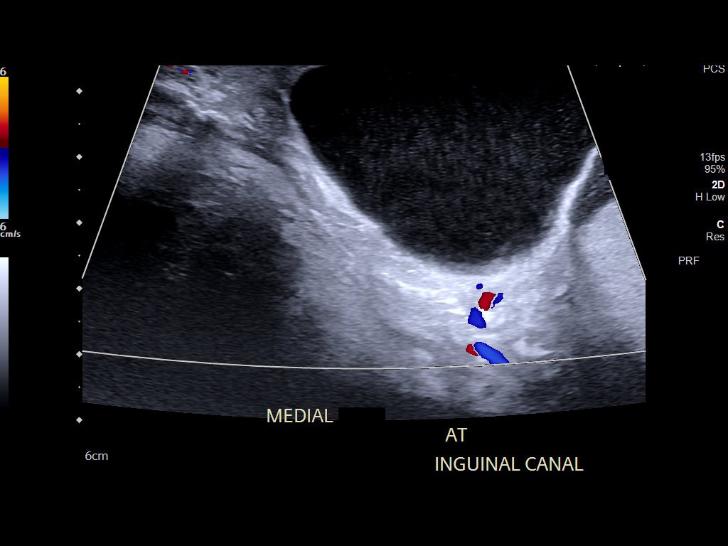
[im 26/29]
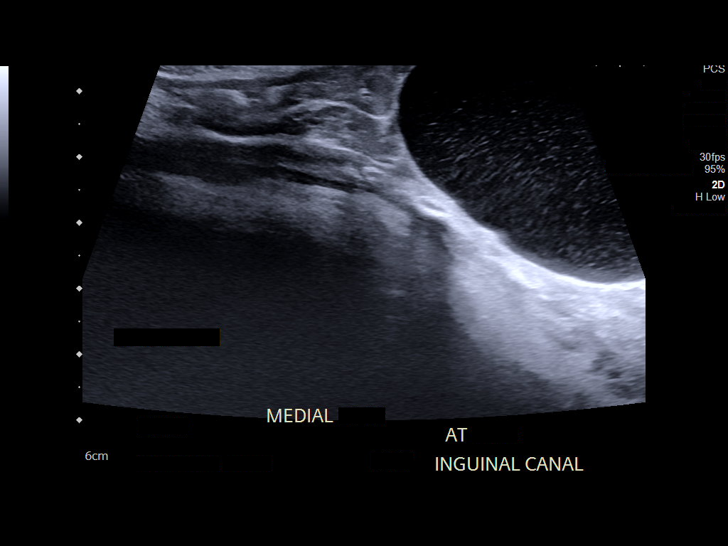
[im 29/29]
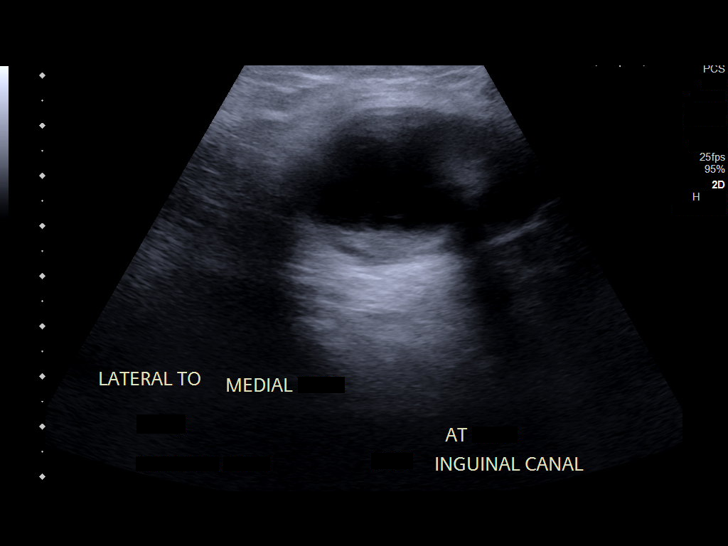

[14 of 25 positions shown; findings below may reference images not displayed]

FINDINGS: There is a complex mass containing fluid components, below the
inguinal herniorrhaphy scar. This extends for 12 x 3 x 5 cm, longest
dimension from lateral to medial. This is most likely a hematoma. No
visualized hernia.
IMPRESSION: 1. Complex mass with fluid components, most likely a hematoma, deep
to the herniorrhaphy scar, measuring 12 x 3 x 5 cm.

## 2020-09-18 ENCOUNTER — Encounter: Payer: Self-pay | Admitting: Internal Medicine

## 2020-10-01 ENCOUNTER — Other Ambulatory Visit: Payer: Self-pay | Admitting: Cardiovascular Disease

## 2020-10-20 ENCOUNTER — Encounter: Payer: Self-pay | Admitting: Podiatry

## 2020-10-20 ENCOUNTER — Other Ambulatory Visit: Payer: Self-pay

## 2020-10-20 ENCOUNTER — Ambulatory Visit: Payer: Medicare Other | Admitting: Podiatry

## 2020-10-20 DIAGNOSIS — M7752 Other enthesopathy of left foot: Secondary | ICD-10-CM

## 2020-10-20 DIAGNOSIS — M21622 Bunionette of left foot: Secondary | ICD-10-CM | POA: Diagnosis not present

## 2020-10-24 ENCOUNTER — Encounter: Payer: Self-pay | Admitting: Internal Medicine

## 2020-10-24 ENCOUNTER — Ambulatory Visit: Payer: Medicare Other | Admitting: Cardiovascular Disease

## 2020-10-24 ENCOUNTER — Other Ambulatory Visit: Payer: Self-pay

## 2020-10-24 ENCOUNTER — Encounter: Payer: Self-pay | Admitting: Cardiovascular Disease

## 2020-10-24 VITALS — BP 138/60 | HR 59 | Ht 68.5 in | Wt 185.1 lb

## 2020-10-24 DIAGNOSIS — I779 Disorder of arteries and arterioles, unspecified: Secondary | ICD-10-CM

## 2020-10-24 DIAGNOSIS — E785 Hyperlipidemia, unspecified: Secondary | ICD-10-CM

## 2020-10-24 DIAGNOSIS — I25118 Atherosclerotic heart disease of native coronary artery with other forms of angina pectoris: Secondary | ICD-10-CM

## 2020-10-24 DIAGNOSIS — I10 Essential (primary) hypertension: Secondary | ICD-10-CM

## 2020-10-24 NOTE — Progress Notes (Signed)
Cardiology Office Note   Date:  10/24/2020   ID:  Shane Mcknight, DOB 12/21/1950, MRN [id number]  PCP:  Einar Pheasant, MD  Cardiologist:   Kathlyn Sacramento, MD   Chief Complaint  Patient presents with   Other    6 Month F/u no complaints today. Meds reviewed verbally with pt.      History of Present Illness: Shane Mcknight is a 70 y.o. male who presents for a follow-up visit regarding coronary artery disease. He has known history of hypertension, hyperlipidemia, type 2 diabetes, GERD and Mallory-Weiss tear.  The patient had anginal symptoms in 2018. Nuclear stress test showed inferior [place] ischemia.  Cardiac catheterization at that time showed significant two-vessel coronary artery disease with severe stenosis in the distal right coronary artery. There was subtotal occlusion of OM1 which was small vessel less than 2 mm, 40% ostial left main stenosis and moderate LAD disease. Ejection fraction was normal. I performed successful angioplasty and drug-eluting stent placement to the right coronary artery.  He had recurrent anginal symptoms in August of 2020.  Repeat cardiac catheterization showed patent distal RCA stent with no significant restenosis.  There was stable moderate mid LAD stenosis and proximal RCA stenosis.  OM1 was noted to be completely occluded.  In addition, there was moderate distal RCA disease just proximal to the previously placed stent.  Ejection fraction was normal with mildly elevated left ventricular end-diastolic pressure.    Most recent Lexiscan Myoview was done in March of this year which showed no evidence of ischemia with normal ejection fraction.  Echocardiogram also in March showed normal LV systolic function with no significant valvular abnormalities.  During last visit, he was having some dizziness and headache with Imdur and thus the dose was decreased to 15 mg once daily.  Since then, he reports improvement in symptoms.  He has minimal chest discomfort and no  shortness of breath.     Past Medical History:  Diagnosis Date   Abnormal liver function tests    Allergic state    Anemia    Arthritis    spine, hands, shoulder with previous cuff tear   CAD (coronary artery disease)    a.  11/2015 CT Chest/Abd: Coronary Ca2+ noted;  b. MV: EF 45-54%, med defect of mod severity in basal infsept, basal inf, mid infsept, and mid inf region-> infarct and peri-infarct ischemia-->intermediate risk; c. 05/2016 Cath/PCI: LM 40ost, LAD 30p, 74m, D1 60ost, LCX 60ost, OM1 99 (small->Med Rx), RCA 60p, 95d (2.5x15 Resolute Onyx DES), RPDA/RPLB min irregs, D2 60, EF 55-65%/   Candidiasis of esophagus (HCC)    Colon polyp    DDD (degenerative disc disease), lumbosacral    Dysphagia    Elevated transaminase level    Esophageal stricture    a. s/p multiple dilations - last 03/2016.   Esophagitis, [place] grade D    Ganglion cyst of finger of right hand    GERD (gastroesophageal reflux disease)    Guaiac positive stools    Hiatal hernia    HNP (herniated nucleus pulposus), lumbar    Hypercholesterolemia    Hypertension    Lumbar radiculitis    Mallory-Weiss tear    a. 11/2015.   Migraine aura without headache    migraine, visual   Psoriasis    with psoriatic arthritis   Psoriatic arthritis ([place])    severe   Schatzki's ring    Sleep apnea    does not uses CPAP. has lost weight  so is much better.   Type II diabetes mellitus ([place])     Past Surgical History:  Procedure Laterality Date   ARTHROPLASTY     right thumb; left thumb with 2 screws and plastic joint   BALLOON DILATION N/A 05/29/2015   Procedure: BALLOON DILATION;  Surgeon: Lollie Sails, MD;  Location: [facility] ENDOSCOPY;  Service: Endoscopy;  Laterality: N/A;   BALLOON DILATION N/A 02/14/2017   Procedure: BALLOON DILATION;  Surgeon: Lollie Sails, MD;  Location: [facility] ENDOSCOPY;  Service: Endoscopy;  Laterality: N/A;   BILATERAL CARPAL TUNNEL RELEASE Bilateral 07/04/2017   Procedure:  BILATERAL CARPAL TUNNEL RELEASE;  Surgeon: Leanor Kail, MD;  Location: [facility] ORS;  Service: Orthopedics;  Laterality: Bilateral;   BLEPHAROPLASTY     CARDIAC CATHETERIZATION     1 stent    CARPAL TUNNEL RELEASE  2005   right   CHOLECYSTECTOMY  2008   COLONOSCOPY     COLONOSCOPY WITH PROPOFOL N/A 02/13/2018   Procedure: COLONOSCOPY WITH PROPOFOL;  Surgeon: Lollie Sails, MD;  Location: [facility] ENDOSCOPY;  Service: Endoscopy;  Laterality: N/A;   CORONARY STENT INTERVENTION N/A 06/14/2016   Procedure: Coronary Stent Intervention;  Surgeon: Wellington Hampshire, MD;  Location: [place] CV LAB;  Service: Cardiovascular;  Laterality: N/A;   CYST EXCISION  04/14/2015   tendon sheath cyst excision; right ring finger cyst removed and trigger finger realease   ESOPHAGOGASTRODUODENOSCOPY     ESOPHAGOGASTRODUODENOSCOPY (EGD) WITH PROPOFOL N/A 05/29/2015   Procedure: ESOPHAGOGASTRODUODENOSCOPY (EGD) WITH PROPOFOL;  Surgeon: Lollie Sails, MD;  Location: [facility] ENDOSCOPY;  Service: Endoscopy;  Laterality: N/A;   ESOPHAGOGASTRODUODENOSCOPY (EGD) WITH PROPOFOL N/A 07/04/2015   Procedure: ESOPHAGOGASTRODUODENOSCOPY (EGD) WITH PROPOFOL;  Surgeon: Lollie Sails, MD;  Location: [facility] ENDOSCOPY;  Service: Endoscopy;  Laterality: N/A;   ESOPHAGOGASTRODUODENOSCOPY (EGD) WITH PROPOFOL N/A 01/04/2016   Procedure: ESOPHAGOGASTRODUODENOSCOPY (EGD) WITH PROPOFOL;  Surgeon: Lollie Sails, MD;  Location: [facility] ENDOSCOPY;  Service: Endoscopy;  Laterality: N/A;   ESOPHAGOGASTRODUODENOSCOPY (EGD) WITH PROPOFOL N/A 03/22/2016   Procedure: ESOPHAGOGASTRODUODENOSCOPY (EGD) WITH PROPOFOL;  Surgeon: Lollie Sails, MD;  Location: [facility] ENDOSCOPY;  Service: Endoscopy;  Laterality: N/A;   ESOPHAGOGASTRODUODENOSCOPY (EGD) WITH PROPOFOL N/A 02/14/2017   Procedure: ESOPHAGOGASTRODUODENOSCOPY (EGD) WITH PROPOFOL;  Surgeon: Lollie Sails, MD;  Location: [facility] ENDOSCOPY;  Service: Endoscopy;  Laterality: N/A;    ESOPHAGOGASTRODUODENOSCOPY (EGD) WITH PROPOFOL N/A 04/11/2017   Procedure: ESOPHAGOGASTRODUODENOSCOPY (EGD) WITH PROPOFOL;  Surgeon: Lollie Sails, MD;  Location: [facility] ENDOSCOPY;  Service: Endoscopy;  Laterality: N/A;   ESOPHAGOGASTRODUODENOSCOPY (EGD) WITH PROPOFOL N/A 07/07/2017   Procedure: ESOPHAGOGASTRODUODENOSCOPY (EGD) WITH PROPOFOL;  Surgeon: Lucilla Lame, MD;  Location: [facility] ENDOSCOPY;  Service: Endoscopy;  Laterality: N/A;   ESOPHAGOGASTRODUODENOSCOPY (EGD) WITH PROPOFOL N/A 02/13/2018   Procedure: ESOPHAGOGASTRODUODENOSCOPY (EGD) WITH PROPOFOL;  Surgeon: Lollie Sails, MD;  Location: [facility] ENDOSCOPY;  Service: Endoscopy;  Laterality: N/A;   EYE SURGERY     FOREIGN BODY REMOVAL N/A 12/03/2015   Procedure: FOREIGN BODY REMOVAL;  Surgeon: Wilford Corner, MD;  Location: [facility] ENDOSCOPY;  Service: Endoscopy;  Laterality: N/A;   HERNIA REPAIR Right 1970   inguinal   INGUINAL HERNIA REPAIR Left 01/01/2018   Procedure: OPEN HERNIA REPAIR INGUINAL ADULT;  Surgeon: Olean Ree, MD;  Location: [facility] ORS;  Service: General;  Laterality: Left;   LEFT HEART CATH Left 06/14/2016   Procedure: Left Heart Cath;  Surgeon: Wellington Hampshire, MD;  Location: [place] CV LAB;  Service: Cardiovascular;  Laterality: Left;  LEFT HEART CATH AND CORONARY ANGIOGRAPHY Left 09/14/2018   Procedure: LEFT HEART CATH AND CORONARY ANGIOGRAPHY;  Surgeon: Wellington Hampshire, MD;  Location: [place] CV LAB;  Service: Cardiovascular;  Laterality: Left;   LUMBAR DISC SURGERY  2004   injections; herniated disc; percutaneous discectomy   NASAL SINUS SURGERY  2008   percutaneous lumbar discectomy     ROTATOR CUFF REPAIR Right    TONSILLECTOMY       Current Outpatient Medications  Medication Sig Dispense Refill   acetaminophen (TYLENOL) 500 MG tablet Take 1,000 mg by mouth daily as needed for moderate pain.     aspirin EC 81 MG tablet Take 81 mg by mouth every evening.      atenolol (TENORMIN) 50 MG  tablet TAKE 1 TABLET BY MOUTH  TWICE DAILY 180 tablet 3   COSENTYX SENSOREADY PEN 150 MG/ML SOAJ Inject 300 mg into the skin every 30 (thirty) days.   10   cyanocobalamin (,VITAMIN B-12,) 1000 MCG/ML injection Inject 1 mL (1,000 mcg total) into the muscle every 30 (thirty) days. 10 mL 1   folic acid (FOLVITE) 1 MG tablet Take 1 mg by mouth daily.     gabapentin (NEURONTIN) 300 MG capsule TAKE 1 TO 2 CAPSULES BY  MOUTH AT BEDTIME 180 capsule 3   isosorbide mononitrate (IMDUR) 30 MG 24 hr tablet Take 0.5 tablets (15 mg total) by mouth daily. 45 tablet 1   losartan (COZAAR) 25 MG tablet Take 0.5 tablets (12.5 mg total) by mouth daily. 45 tablet 1   metFORMIN (GLUCOPHAGE) 500 MG tablet TAKE 2 TABLETS BY MOUTH  TWICE DAILY WITH A MEAL 360 tablet 3   nitroGLYCERIN (NITROSTAT) 0.4 MG SL tablet Place 1 tablet (0.4 mg total) under the tongue every 5 (five) minutes as needed. MAXIMUM OF 3 DOSES. 25 tablet 6   pantoprazole (PROTONIX) 40 MG tablet TAKE 1 TABLET BY MOUTH  TWICE DAILY 180 tablet 3   rosuvastatin (CRESTOR) 10 MG tablet Take 1 tablet (10 mg total) by mouth daily. 90 tablet 1   SYRINGE-NEEDLE, DISP, 3 ML 25G X 5/8" 3 ML MISC Use as instructed with B12 injection. 50 each 11   triamcinolone cream (KENALOG) 0.5 % Apply 1 application topically 2 (two) times daily as needed (scaling).     vitamin E 400 UNIT capsule Take 400 Units by mouth every evening.     methotrexate (RHEUMATREX) 2.5 MG tablet Take 8 tablets (20 mg total) by mouth once a week. Take 8 tablets every Friday. (Patient not taking: Reported on 10/24/2020) 8 tablet 0   Multiple Vitamin (MULTIVITAMIN) tablet Take 1 tablet by mouth daily. (Patient not taking: Reported on 10/24/2020)     No current facility-administered medications for this visit.    Allergies:   Ephedrine, Nsaids, Other, Vancomycin, Amoxicillin, Hyoscyamine sulfate, Levsin [hyoscyamine sulfate], Naproxen, Penicillins, Shellfish allergy, and Sudafed [pseudoephedrine hcl]     Social History:  The patient  reports that he quit smoking about 38 years ago. His smoking use included cigarettes. He has never used smokeless tobacco. He reports current alcohol use. He reports that he does not use drugs.   Family History:  [name] family history includes Aneurysm in his son; Cancer in his father; Hypertension in his mother; Lung cancer in his father.    ROS:  Please see the history of present illness.   Otherwise, review of systems are positive for none.   All other systems are reviewed and negative.    PHYSICAL  EXAM: VS:  BP 138/60 (BP Location: Left Arm, Patient Position: Sitting, Cuff Size: Normal)   Pulse (!) 59   Ht 5' 8.5" (1.74 m)   Wt 185 lb 2 oz (84 kg)   SpO2 98%   BMI 27.74 kg/m  , BMI Body mass index is 27.74 kg/m. GEN: Well nourished, well developed, in no acute distress  HEENT: normal  Neck: no JVD or masses.  Left carotid bruit. Cardiac: RRR; no murmurs, rubs, or gallops,no edema  Respiratory:  clear to auscultation bilaterally, normal work of breathing GI: soft, nontender, nondistended, + BS MS: no deformity or atrophy  Skin: warm and dry, Mild rash on the back Neuro:  Strength and sensation are intact Psych: euthymic mood, full affect   EKG:  EKG is ordered today. The ekg ordered today demonstrates sinus bradycardia with no significant ST or T wave changes.  Left axis deviation.   Recent Labs: 03/20/2020: B Natriuretic Peptide 18.9 04/19/2020: ALT 14; BUN 13; Creatinine, Ser 0.85; Hemoglobin 12.1; Platelets 182.0; Potassium 4.1; Sodium 140    Lipid Panel    Component Value Date/Time   CHOL 110 04/19/2020 0807   TRIG 88.0 04/19/2020 0807   HDL 34.20 (L) 04/19/2020 0807   CHOLHDL 3 04/19/2020 0807   VLDL 17.6 04/19/2020 0807   LDLCALC 59 04/19/2020 0807      Wt Readings from Last 3 Encounters:  10/24/20 185 lb 2 oz (84 kg)  07/03/20 187 lb (84.8 kg)  05/12/20 184 lb 8 oz (83.7 kg)      PAD Screen 05/21/2016  Previous  PAD dx? No  Previous surgical procedure? No  Pain with walking? No  Feet/toe relief with dangling? No  Painful, non-healing ulcers? No  Extremities discolored? No      ASSESSMENT AND PLAN:  1.  Coronary artery disease involving native coronary arteries with other forms of angina: He is doing very well at the present time with minimal angina.  I recommend continuing current medications.  I reviewed cardiac work-up with him which was done this year and was overall unremarkable.  2. Hyperlipidemia: I reviewed most recent lipid profile which was done in April.  It showed an LDL of 59 and triglyceride of 88.  Both at target.  Continue same dose of rosuvastatin 10 mg daily.  3. Essential hypertension: Blood pressure is controlled on current medications.  4.  Carotid disease: Most recent carotid Doppler in April 2022 showed stable moderate bilateral carotid stenosis.  I requested repeat study to be done in April 2023.   Disposition:   FU with me in 12 months  Signed,  Kathlyn Sacramento, MD  10/24/2020 4:57 PM    [place]

## 2020-10-24 NOTE — Patient Instructions (Signed)
Medication Instructions:  Your physician recommends that you continue on your current medications as directed. Please refer to the Current Medication list given to you today.  *If you need a refill on your cardiac medications before your next appointment, please call your pharmacy*   Lab Work: None ordered  If you have labs (blood work) drawn today and your tests are completely normal, you will receive your results only by: MyChart Message (if you have MyChart) OR A paper copy in the mail If you have any lab test that is abnormal or we need to change your treatment, we will call you to review the results.   Testing/Procedures: None ordered   Follow-Up: At [facility], you and your health needs are our priority.  As part of our continuing mission to provide you with exceptional heart care, we have created designated Provider Care Teams.  These Care Teams include your primary Cardiologist (physician) and Advanced Practice Providers (APPs -  Physician Assistants and Nurse Practitioners) who all work together to provide you with the care you need, when you need it.  We recommend signing up for the patient portal called "MyChart".  Sign up information is provided on this After Visit Summary.  MyChart is used to connect with patients for Virtual Visits (Telemedicine).  Patients are able to view lab/test results, encounter notes, upcoming appointments, etc.  Non-urgent messages can be sent to your provider as well.   To learn more about what you can do with MyChart, go to [email or web address].    Your next appointment:   Your physician wants you to follow-up in: 1 year You will receive a reminder letter in the mail two months in advance. If you don't receive a letter, please call our office to schedule the follow-up appointment.   The format for your next appointment:   In Person  Provider:   You may see Muhammad Arida, MD or one of the following Advanced Practice Providers on your  designated Care Team:  Christopher Berge, NP Ryan Dunn, PA-C Jacquelyn Visser, PA-C Cadence Furth, PA-C   Other Instructions N/A  

## 2020-10-27 NOTE — Telephone Encounter (Signed)
He is overdue a f/u appt with me and overdue labs.  Needs labs so that we know how his sugars are doing.  Also, appt to discuss other treatment options for his sugar.  He is currently taking regular metformin.  Can also try changing him to extended release metformin - has less GI side effects.

## 2020-10-30 ENCOUNTER — Other Ambulatory Visit (HOSPITAL_BASED_OUTPATIENT_CLINIC_OR_DEPARTMENT_OTHER): Payer: Self-pay | Admitting: Physician Assistant

## 2020-10-30 DIAGNOSIS — I6523 Occlusion and stenosis of bilateral carotid arteries: Secondary | ICD-10-CM

## 2020-10-30 NOTE — Telephone Encounter (Signed)
Pt agreeable to appt and labs. Needs late PM due to work. Is there a day that we can work him in for a 4:00 within the next few weeks? Once appt scheduled- will do labs prior to appt. Pt would like to hold on changing metformin until he sees you since he just got a refill and medication may change after appt with you.

## 2020-10-31 NOTE — Telephone Encounter (Signed)
Can schedule for tentatively schedule for 11/10/20 at 4:00.

## 2020-11-01 NOTE — Telephone Encounter (Signed)
Patient scheduled for labs and appt with Dr Nicki Reaper

## 2020-11-02 ENCOUNTER — Encounter: Payer: Self-pay | Admitting: Internal Medicine

## 2020-11-02 ENCOUNTER — Other Ambulatory Visit: Payer: Medicare Other

## 2020-11-03 NOTE — Telephone Encounter (Signed)
LMTCB

## 2020-11-03 NOTE — Telephone Encounter (Signed)
Patient called and lab appt rescheduled.

## 2020-11-05 NOTE — Progress Notes (Signed)
HPI: 70 y.o. 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  Patient states that he is now in a position to proceed with surgery.  He would like a surgery with a quick recovery.  Last visit on 05/26/2020 we did discuss tailor's bunionectomy with an osteotomy versus metatarsal head resection.  The patient still works.  He presents for further treatment and evaluation  Past Medical History:  Diagnosis Date   Abnormal liver function tests    Allergic state    Anemia    Arthritis    spine, hands, shoulder with previous cuff tear   CAD (coronary artery disease)    a.  11/2015 CT Chest/Abd: Coronary Ca2+ noted;  b. MV: EF 45-54%, med defect of mod severity in basal infsept, basal inf, mid infsept, and mid inf region-> infarct and peri-infarct ischemia-->intermediate risk; c. 05/2016 Cath/PCI: LM 40ost, LAD 30p, 19m, D1 60ost, LCX 60ost, OM1 99 (small->Med Rx), RCA 60p, 95d (2.5x15 Resolute Onyx DES), RPDA/RPLB min irregs, D2 60, EF 55-65%/   Candidiasis of esophagus (HCC)    Colon polyp    DDD (degenerative disc disease), lumbosacral    Dysphagia    Elevated transaminase level    Esophageal stricture    a. s/p multiple dilations - last 03/2016.   Esophagitis, [place] grade D    Ganglion cyst of finger of right hand    GERD (gastroesophageal reflux disease)    Guaiac positive stools    Hiatal hernia    HNP (herniated nucleus pulposus), lumbar    Hypercholesterolemia    Hypertension    Lumbar radiculitis    Mallory-Weiss tear    a. 11/2015.   Migraine aura without headache    migraine, visual   Psoriasis    with psoriatic arthritis   Psoriatic arthritis ([place])    severe   Schatzki's ring    Sleep apnea    does not uses CPAP. has lost weight so is much better.   Type II diabetes mellitus ([place])      Physical Exam: General: The patient is alert and oriented x3 in no acute  distress.  Dermatology: Skin is warm, dry and supple bilateral lower extremities. Negative for open lesions or macerations.  Vascular: Palpable pedal pulses bilaterally. No edema or erythema noted. Capillary refill within normal limits.  Neurological: Epicritic and protective threshold grossly intact bilaterally.   Musculoskeletal Exam: Range of motion within normal limits to all pedal and ankle joints bilateral. Muscle strength 5/5 in all groups bilateral.  Large prominent tailor's bunion noted on clinical exam with associated tenderness to palpation range of motion of the fifth MTPJ consistent with a capsulitis of the joint  Radiographic Exam 05/26/2020 LT foot:  Normal osseous mineralization. Joint spaces preserved. No fracture/dislocation/boney destruction.  Large increased intermetatarsal angle noted between the fourth and fifth metatarsals with lateral bowing of the distal third of the fifth metatarsal head  Assessment: 1.  Tailor's bunion deformity left 2.  Fifth MTPJ capsulitis left   Plan of Care:  1. Patient evaluated. X-Rays reviewed.  2. Today we discussed the conservative versus surgical management of the presenting pathology. The patient opts for surgical management. All possible complications and details of the procedure were explained. All patient questions were answered. No guarantees were expressed or implied. 3. Authorization for surgery was initiated today. Surgery will consist of fifth metatarsal head resection left 4.  Short cam boot was dispensed.  This will be to wear postoperatively 5.  Return to clinic 1 week postop       Edrick Kins, DPM [facility]  Dr. Edrick Kins, DPM    [address], [place] [address]                Office [phone]  Fax [phone]

## 2020-11-06 ENCOUNTER — Other Ambulatory Visit: Payer: Self-pay | Admitting: Cardiovascular Disease

## 2020-11-06 ENCOUNTER — Other Ambulatory Visit: Payer: Self-pay | Admitting: Internal Medicine

## 2020-11-06 DIAGNOSIS — I5022 Chronic systolic (congestive) heart failure: Secondary | ICD-10-CM

## 2020-11-07 ENCOUNTER — Other Ambulatory Visit: Payer: Self-pay

## 2020-11-07 ENCOUNTER — Other Ambulatory Visit (INDEPENDENT_AMBULATORY_CARE_PROVIDER_SITE_OTHER): Payer: Medicare Other

## 2020-11-07 DIAGNOSIS — L4050 Arthropathic psoriasis, unspecified: Secondary | ICD-10-CM | POA: Diagnosis not present

## 2020-11-07 DIAGNOSIS — D649 Anemia, unspecified: Secondary | ICD-10-CM

## 2020-11-07 DIAGNOSIS — E785 Hyperlipidemia, unspecified: Secondary | ICD-10-CM | POA: Diagnosis not present

## 2020-11-07 DIAGNOSIS — E0822 Diabetes mellitus due to underlying condition with diabetic chronic kidney disease: Secondary | ICD-10-CM

## 2020-11-07 DIAGNOSIS — I10 Essential (primary) hypertension: Secondary | ICD-10-CM | POA: Diagnosis not present

## 2020-11-07 DIAGNOSIS — N183 Chronic kidney disease, stage 3 unspecified: Secondary | ICD-10-CM | POA: Diagnosis not present

## 2020-11-07 LAB — HEPATIC FUNCTION PANEL
ALT: 12 U/L (ref 0–53)
AST: 14 U/L (ref 0–37)
Albumin: 4.3 g/dL (ref 3.5–5.2)
Alkaline Phosphatase: 46 U/L (ref 39–117)
Bilirubin, Direct: 0.1 mg/dL (ref 0.0–0.3)
Total Bilirubin: 0.4 mg/dL (ref 0.2–1.2)
Total Protein: 7.1 g/dL (ref 6.0–8.3)

## 2020-11-07 LAB — LIPID PANEL
Cholesterol: 116 mg/dL (ref 0–200)
HDL: 31.6 mg/dL — ABNORMAL LOW (ref >39.00)
LDL Cholesterol: 59 mg/dL (ref 0–99)
NonHDL: 84.83
Total CHOL/HDL Ratio: 4
Triglycerides: 128 mg/dL (ref 0.0–149.0)
VLDL: 25.6 mg/dL (ref 0.0–40.0)

## 2020-11-07 LAB — BASIC METABOLIC PANEL
BUN: 13 mg/dL (ref 6–23)
CO2: 24 mEq/L (ref 19–32)
Calcium: 9.4 mg/dL (ref 8.4–10.5)
Chloride: 106 mEq/L (ref 96–112)
Creatinine, Ser: 0.86 mg/dL (ref 0.40–1.50)
GFR: 86.75 mL/min (ref >60.00)
Glucose, Bld: 160 mg/dL — ABNORMAL HIGH (ref 70–99)
Potassium: 4.1 mEq/L (ref 3.5–5.1)
Sodium: 140 mEq/L (ref 135–145)

## 2020-11-07 LAB — CBC WITH DIFFERENTIAL/PLATELET
Basophils Absolute: 0 10*3/uL (ref 0.0–0.1)
Basophils Relative: 0.7 % (ref 0.0–3.0)
Eosinophils Absolute: 0.3 10*3/uL (ref 0.0–0.7)
Eosinophils Relative: 6.2 % — ABNORMAL HIGH (ref 0.0–5.0)
HCT: 36.3 % — ABNORMAL LOW (ref 39.0–52.0)
Hemoglobin: 12.2 g/dL — ABNORMAL LOW (ref 13.0–17.0)
Lymphocytes Relative: 19.2 % (ref 12.0–46.0)
Lymphs Abs: 1 10*3/uL (ref 0.7–4.0)
MCHC: 33.6 g/dL (ref 30.0–36.0)
MCV: 82.8 fl (ref 78.0–100.0)
Monocytes Absolute: 0.5 10*3/uL (ref 0.1–1.0)
Monocytes Relative: 9.8 % (ref 3.0–12.0)
Neutro Abs: 3.4 10*3/uL (ref 1.4–7.7)
Neutrophils Relative %: 64.1 % (ref 43.0–77.0)
Platelets: 174 10*3/uL (ref 150.0–400.0)
RBC: 4.39 Mil/uL (ref 4.22–5.81)
RDW: 17.9 % — ABNORMAL HIGH (ref 11.5–15.5)
WBC: 5.3 10*3/uL (ref 4.0–10.5)

## 2020-11-07 LAB — TSH: TSH: 1.9 u[IU]/mL (ref 0.35–5.50)

## 2020-11-07 LAB — FERRITIN: Ferritin: 9.9 ng/mL — ABNORMAL LOW (ref 22.0–322.0)

## 2020-11-07 LAB — VITAMIN B12: Vitamin B-12: 179 pg/mL — ABNORMAL LOW (ref 211–911)

## 2020-11-07 LAB — HEMOGLOBIN A1C: Hgb A1c MFr Bld: 7.2 % — ABNORMAL HIGH (ref 4.6–6.5)

## 2020-11-10 ENCOUNTER — Ambulatory Visit: Payer: Medicare Other | Admitting: Internal Medicine

## 2020-11-28 DIAGNOSIS — L4050 Arthropathic psoriasis, unspecified: Secondary | ICD-10-CM | POA: Diagnosis not present

## 2020-11-28 DIAGNOSIS — M159 Polyosteoarthritis, unspecified: Secondary | ICD-10-CM | POA: Diagnosis not present

## 2020-12-05 ENCOUNTER — Telehealth: Payer: Self-pay | Admitting: Urology

## 2020-12-05 NOTE — Telephone Encounter (Signed)
DOS - 12/28/20  METATARSAL HEAD RESC. 5TH LEFT --- [address]  [facility] EFFECTIVE DATE - 01/15/20   PLAN DEDUCTIBLE - $0.00 OUT OF POCKET - $4,500.00 W/ $3,856.04 REMAINING COINSURANCE - 0% COPAY - $325.00   PER [employer] WEB SITE FOR CPT CODE 41753 Notification or Prior Authorization is not required for the requested services  Decision ID #:[id number]

## 2020-12-14 HISTORY — PX: BUNIONECTOMY: SHX129

## 2020-12-28 ENCOUNTER — Encounter: Payer: Self-pay | Admitting: Podiatry

## 2020-12-28 ENCOUNTER — Other Ambulatory Visit: Payer: Self-pay | Admitting: Podiatry

## 2020-12-28 DIAGNOSIS — M21622 Bunionette of left foot: Secondary | ICD-10-CM | POA: Diagnosis not present

## 2020-12-28 DIAGNOSIS — M21542 Acquired clubfoot, left foot: Secondary | ICD-10-CM | POA: Diagnosis not present

## 2020-12-28 MED ORDER — OXYCODONE-ACETAMINOPHEN 5-325 MG PO TABS: 1.0000 | ORAL_TABLET | ORAL | 0 refills | Status: DC | PRN

## 2020-12-28 NOTE — Progress Notes (Signed)
PRN postop 

## 2021-01-05 ENCOUNTER — Ambulatory Visit (INDEPENDENT_AMBULATORY_CARE_PROVIDER_SITE_OTHER): Payer: Medicare Other | Admitting: Podiatry

## 2021-01-05 ENCOUNTER — Other Ambulatory Visit: Payer: Self-pay

## 2021-01-05 ENCOUNTER — Ambulatory Visit: Payer: Medicare Other

## 2021-01-05 DIAGNOSIS — Z9889 Other specified postprocedural states: Secondary | ICD-10-CM

## 2021-01-12 ENCOUNTER — Other Ambulatory Visit: Payer: Self-pay

## 2021-01-12 ENCOUNTER — Encounter: Payer: Self-pay | Admitting: Podiatry

## 2021-01-12 ENCOUNTER — Ambulatory Visit (INDEPENDENT_AMBULATORY_CARE_PROVIDER_SITE_OTHER): Payer: Medicare Other | Admitting: Podiatry

## 2021-01-12 VITALS — Temp 98.9°F

## 2021-01-12 DIAGNOSIS — Z9889 Other specified postprocedural states: Secondary | ICD-10-CM

## 2021-01-12 MED ORDER — DOXYCYCLINE HYCLATE 100 MG PO TABS
100.0000 mg | ORAL_TABLET | Freq: Two times a day (BID) | ORAL | 0 refills | Status: DC
Start: 2021-01-12 — End: 2021-02-15

## 2021-01-16 ENCOUNTER — Telehealth: Payer: Self-pay | Admitting: *Deleted

## 2021-01-16 ENCOUNTER — Encounter: Payer: Self-pay | Admitting: Podiatry

## 2021-01-16 ENCOUNTER — Other Ambulatory Visit: Payer: Self-pay

## 2021-01-16 ENCOUNTER — Ambulatory Visit (INDEPENDENT_AMBULATORY_CARE_PROVIDER_SITE_OTHER): Payer: Medicare Other | Admitting: Podiatry

## 2021-01-16 ENCOUNTER — Ambulatory Visit (INDEPENDENT_AMBULATORY_CARE_PROVIDER_SITE_OTHER): Payer: Medicare Other

## 2021-01-16 ENCOUNTER — Other Ambulatory Visit: Payer: Self-pay | Admitting: Podiatry

## 2021-01-16 VITALS — Temp 97.9°F

## 2021-01-16 DIAGNOSIS — L03119 Cellulitis of unspecified part of limb: Secondary | ICD-10-CM | POA: Diagnosis not present

## 2021-01-16 DIAGNOSIS — L02619 Cutaneous abscess of unspecified foot: Secondary | ICD-10-CM

## 2021-01-16 DIAGNOSIS — Z9889 Other specified postprocedural states: Secondary | ICD-10-CM

## 2021-01-16 MED ORDER — CIPROFLOXACIN HCL 500 MG PO TABS: 500.0000 mg | ORAL_TABLET | Freq: Two times a day (BID) | ORAL | 0 refills | Status: DC

## 2021-01-16 NOTE — Progress Notes (Signed)
Subjective:  Patient presents today status post fifth metatarsal head resection left foot. DOS: 12/28/2020.  Patient states that he is doing well.  He says that the pain is about 4-05/2008.  He denies any fever chills nausea vomiting shortness of breath or chest pain.  He says that he is not really taking any pain medication.  He has been weightbearing in surgical shoe as instructed.  No new complaints at this time  Past Medical History:  Diagnosis Date   Abnormal liver function tests    Allergic state    Anemia    Arthritis    spine, hands, shoulder with previous cuff tear   CAD (coronary artery disease)    a.  11/2015 CT Chest/Abd: Coronary Ca2+ noted;  b. MV: EF 45-54%, med defect of mod severity in basal infsept, basal inf, mid infsept, and mid inf region-> infarct and peri-infarct ischemia-->intermediate risk; c. 05/2016 Cath/PCI: LM 40ost, LAD 30p, 44m, D1 60ost, LCX 60ost, OM1 99 (small->Med Rx), RCA 60p, 95d (2.5x15 Resolute Onyx DES), RPDA/RPLB min irregs, D2 60, EF 55-65%/   Candidiasis of esophagus (HCC)    Colon polyp    DDD (degenerative disc disease), lumbosacral    Dysphagia    Elevated transaminase level    Esophageal stricture    a. s/p multiple dilations - last 03/2016.   Esophagitis, [place] grade D    Ganglion cyst of finger of right hand    GERD (gastroesophageal reflux disease)    Guaiac positive stools    Hiatal hernia    HNP (herniated nucleus pulposus), lumbar    Hypercholesterolemia    Hypertension    Lumbar radiculitis    Mallory-Weiss tear    a. 11/2015.   Migraine aura without headache    migraine, visual   Psoriasis    with psoriatic arthritis   Psoriatic arthritis ([place])    severe   Schatzki's ring    Sleep apnea    does not uses CPAP. has lost weight so is much better.   Type II diabetes mellitus ([place])      Past Surgical History:  Procedure Laterality Date   ARTHROPLASTY     right thumb; left thumb with 2 screws and plastic joint    BALLOON DILATION N/A 05/29/2015   Procedure: BALLOON DILATION;  Surgeon: Lollie Sails, MD;  Location: [facility] ENDOSCOPY;  Service: Endoscopy;  Laterality: N/A;   BALLOON DILATION N/A 02/14/2017   Procedure: BALLOON DILATION;  Surgeon: Lollie Sails, MD;  Location: [facility] ENDOSCOPY;  Service: Endoscopy;  Laterality: N/A;   BILATERAL CARPAL TUNNEL RELEASE Bilateral 07/04/2017   Procedure: BILATERAL CARPAL TUNNEL RELEASE;  Surgeon: Leanor Kail, MD;  Location: [facility] ORS;  Service: Orthopedics;  Laterality: Bilateral;   BLEPHAROPLASTY     CARDIAC CATHETERIZATION     1 stent    CARPAL TUNNEL RELEASE  2005   right   CHOLECYSTECTOMY  2008   COLONOSCOPY     COLONOSCOPY WITH PROPOFOL N/A 02/13/2018   Procedure: COLONOSCOPY WITH PROPOFOL;  Surgeon: Lollie Sails, MD;  Location: [facility] ENDOSCOPY;  Service: Endoscopy;  Laterality: N/A;   CORONARY STENT INTERVENTION N/A 06/14/2016   Procedure: Coronary Stent Intervention;  Surgeon: Wellington Hampshire, MD;  Location: [place] CV LAB;  Service: Cardiovascular;  Laterality: N/A;   CYST EXCISION  04/14/2015   tendon sheath cyst excision; right ring finger cyst removed and trigger finger realease   ESOPHAGOGASTRODUODENOSCOPY     ESOPHAGOGASTRODUODENOSCOPY (EGD) WITH PROPOFOL N/A 05/29/2015  Procedure: ESOPHAGOGASTRODUODENOSCOPY (EGD) WITH PROPOFOL;  Surgeon: Lollie Sails, MD;  Location: [facility] ENDOSCOPY;  Service: Endoscopy;  Laterality: N/A;   ESOPHAGOGASTRODUODENOSCOPY (EGD) WITH PROPOFOL N/A 07/04/2015   Procedure: ESOPHAGOGASTRODUODENOSCOPY (EGD) WITH PROPOFOL;  Surgeon: Lollie Sails, MD;  Location: [facility] ENDOSCOPY;  Service: Endoscopy;  Laterality: N/A;   ESOPHAGOGASTRODUODENOSCOPY (EGD) WITH PROPOFOL N/A 01/04/2016   Procedure: ESOPHAGOGASTRODUODENOSCOPY (EGD) WITH PROPOFOL;  Surgeon: Lollie Sails, MD;  Location: [facility] ENDOSCOPY;  Service: Endoscopy;  Laterality: N/A;   ESOPHAGOGASTRODUODENOSCOPY (EGD) WITH PROPOFOL N/A 03/22/2016    Procedure: ESOPHAGOGASTRODUODENOSCOPY (EGD) WITH PROPOFOL;  Surgeon: Lollie Sails, MD;  Location: [facility] ENDOSCOPY;  Service: Endoscopy;  Laterality: N/A;   ESOPHAGOGASTRODUODENOSCOPY (EGD) WITH PROPOFOL N/A 02/14/2017   Procedure: ESOPHAGOGASTRODUODENOSCOPY (EGD) WITH PROPOFOL;  Surgeon: Lollie Sails, MD;  Location: [facility] ENDOSCOPY;  Service: Endoscopy;  Laterality: N/A;   ESOPHAGOGASTRODUODENOSCOPY (EGD) WITH PROPOFOL N/A 04/11/2017   Procedure: ESOPHAGOGASTRODUODENOSCOPY (EGD) WITH PROPOFOL;  Surgeon: Lollie Sails, MD;  Location: [facility] ENDOSCOPY;  Service: Endoscopy;  Laterality: N/A;   ESOPHAGOGASTRODUODENOSCOPY (EGD) WITH PROPOFOL N/A 07/07/2017   Procedure: ESOPHAGOGASTRODUODENOSCOPY (EGD) WITH PROPOFOL;  Surgeon: Lucilla Lame, MD;  Location: [facility] ENDOSCOPY;  Service: Endoscopy;  Laterality: N/A;   ESOPHAGOGASTRODUODENOSCOPY (EGD) WITH PROPOFOL N/A 02/13/2018   Procedure: ESOPHAGOGASTRODUODENOSCOPY (EGD) WITH PROPOFOL;  Surgeon: Lollie Sails, MD;  Location: [facility] ENDOSCOPY;  Service: Endoscopy;  Laterality: N/A;   EYE SURGERY     FOREIGN BODY REMOVAL N/A 12/03/2015   Procedure: FOREIGN BODY REMOVAL;  Surgeon: Wilford Corner, MD;  Location: [facility] ENDOSCOPY;  Service: Endoscopy;  Laterality: N/A;   HERNIA REPAIR Right 1970   inguinal   INGUINAL HERNIA REPAIR Left 01/01/2018   Procedure: OPEN HERNIA REPAIR INGUINAL ADULT;  Surgeon: Olean Ree, MD;  Location: [facility] ORS;  Service: General;  Laterality: Left;   LEFT HEART CATH Left 06/14/2016   Procedure: Left Heart Cath;  Surgeon: Wellington Hampshire, MD;  Location: [place] CV LAB;  Service: Cardiovascular;  Laterality: Left;   LEFT HEART CATH AND CORONARY ANGIOGRAPHY Left 09/14/2018   Procedure: LEFT HEART CATH AND CORONARY ANGIOGRAPHY;  Surgeon: Wellington Hampshire, MD;  Location: [place] CV LAB;  Service: Cardiovascular;  Laterality: Left;   LUMBAR DISC SURGERY  2004   injections; herniated disc; percutaneous discectomy    NASAL SINUS SURGERY  2008   percutaneous lumbar discectomy     ROTATOR CUFF REPAIR Right    TONSILLECTOMY     Allergies  Allergen Reactions   Ephedrine Other (See Comments)    Causing prostate to hurt. Using for sinus bronchodilators Causing prostate to hurt. Using for sinus bronchodilators Pain in groin, prostate   Nsaids Other (See Comments)    GI upset, history of esophagitis and Mallory-Weis tear   Other Rash, Other (See Comments) and Nausea Only    Paper tape causes rash Plastic tape is okay GI upset, history of esophagitis and Mallory-Weis tear   Vancomycin Itching and Rash    Same as pcn with itching   Amoxicillin Itching and Rash   Hyoscyamine Sulfate Other (See Comments)    Urinary retention Urine retention   Levsin [Hyoscyamine Sulfate] Other (See Comments)    Urinary retention   Naproxen Other (See Comments)    GI upset Also happens with other NSAIDS  GI upset Also happens with other NSAIDS GI upset   Penicillins Itching and Rash    Has patient had a PCN reaction causing immediate rash, facial/tongue/throat swelling, SOB or lightheadedness with hypotension: yes  Has patient had a PCN reaction causing severe rash involving mucus membranes or skin necrosis: no Has patient had a PCN reaction that required hospitalization: no Has patient had a PCN reaction occurring within the last 10 years: no If all of the above answers are "NO", then may proceed with Cephalosporin use.    Shellfish Allergy Rash    Ingested shellfish cCuses rash and itching along with stomach sickness. Pt tolerates betadine    Sudafed [Pseudoephedrine Hcl] Other (See Comments)    Constricts prostate flow (from ephedrine d/t sinus meds)     Objective/Physical Exam Neurovascular status intact.  Skin incisions appear to be well coapted with staples intact.  There is some mild localized erythema just around the incision site.  No dehiscence. No active bleeding noted.  No drainage.  Moderate  edema noted to the lateral forefoot  Radiographic Exam:  Absence of the fifth metatarsal head noted.  Osteotomy site appears stable with good healing  Assessment: 1. s/p fifth metatarsal head resection left foot. DOS: 12/28/2020   Plan of Care:  1. Patient was evaluated. X-rays reviewed 2.  Dressings changed today.  Clean dry and intact x1 week 3.  Continue minimal weightbearing in the surgical shoe 4.  Return to clinic in 1 week for possible staple removal   Edrick Kins, DPM [facility]  Dr. Edrick Kins, DPM    [address], [place] [address]                Office [phone]  Fax [phone]

## 2021-01-16 NOTE — Telephone Encounter (Signed)
"  I was just over there.  Dr. Amalia Hailey said he was going to call in an antibiotic for me.  [facility] said they have not the phone call yet.  I want to make sure I got it so I can get on it today.  The infection I got in my foot is pretty bad."

## 2021-01-16 NOTE — Telephone Encounter (Signed)
Sent!

## 2021-01-18 LAB — WOUND CULTURE

## 2021-01-19 ENCOUNTER — Encounter: Payer: Medicare Other | Admitting: Podiatry

## 2021-01-23 ENCOUNTER — Other Ambulatory Visit: Payer: Self-pay

## 2021-01-23 ENCOUNTER — Encounter: Payer: Self-pay | Admitting: Podiatry

## 2021-01-23 ENCOUNTER — Ambulatory Visit (INDEPENDENT_AMBULATORY_CARE_PROVIDER_SITE_OTHER): Payer: Medicare Other | Admitting: Podiatry

## 2021-01-23 ENCOUNTER — Encounter: Payer: Self-pay | Admitting: *Deleted

## 2021-01-23 DIAGNOSIS — Z9889 Other specified postprocedural states: Secondary | ICD-10-CM

## 2021-01-23 MED ORDER — CIPROFLOXACIN HCL 500 MG PO TABS: 500.0000 mg | ORAL_TABLET | Freq: Two times a day (BID) | ORAL | 0 refills | Status: AC

## 2021-01-26 ENCOUNTER — Encounter: Payer: Medicare Other | Admitting: Podiatry

## 2021-01-26 NOTE — Progress Notes (Signed)
Subjective:  Patient presents today status post fifth metatarsal head resection left foot. DOS: 12/28/2020.  Patient states that he is doing well but he did accidentally hit his foot a couple of times.  He says that he has had some increased pain and swelling to the area.  He stopped wearing the cam boot that was provided last visit.  Past Medical History:  Diagnosis Date   Abnormal liver function tests    Allergic state    Anemia    Arthritis    spine, hands, shoulder with previous cuff tear   CAD (coronary artery disease)    a.  11/2015 CT Chest/Abd: Coronary Ca2+ noted;  b. MV: EF 45-54%, med defect of mod severity in basal infsept, basal inf, mid infsept, and mid inf region-> infarct and peri-infarct ischemia-->intermediate risk; c. 05/2016 Cath/PCI: LM 40ost, LAD 30p, 63m, D1 60ost, LCX 60ost, OM1 99 (small->Med Rx), RCA 60p, 95d (2.5x15 Resolute Onyx DES), RPDA/RPLB min irregs, D2 60, EF 55-65%/   Candidiasis of esophagus (HCC)    Colon polyp    DDD (degenerative disc disease), lumbosacral    Dysphagia    Elevated transaminase level    Esophageal stricture    a. s/p multiple dilations - last 03/2016.   Esophagitis, [place] grade D    Ganglion cyst of finger of right hand    GERD (gastroesophageal reflux disease)    Guaiac positive stools    Hiatal hernia    HNP (herniated nucleus pulposus), lumbar    Hypercholesterolemia    Hypertension    Lumbar radiculitis    Mallory-Weiss tear    a. 11/2015.   Migraine aura without headache    migraine, visual   Psoriasis    with psoriatic arthritis   Psoriatic arthritis ([place])    severe   Schatzki's ring    Sleep apnea    does not uses CPAP. has lost weight so is much better.   Type II diabetes mellitus ([place])      Past Surgical History:  Procedure Laterality Date   ARTHROPLASTY     right thumb; left thumb with 2 screws and plastic joint   BALLOON DILATION N/A 05/29/2015   Procedure: BALLOON DILATION;  Surgeon: Lollie Sails, MD;  Location: [facility] ENDOSCOPY;  Service: Endoscopy;  Laterality: N/A;   BALLOON DILATION N/A 02/14/2017   Procedure: BALLOON DILATION;  Surgeon: Lollie Sails, MD;  Location: [facility] ENDOSCOPY;  Service: Endoscopy;  Laterality: N/A;   BILATERAL CARPAL TUNNEL RELEASE Bilateral 07/04/2017   Procedure: BILATERAL CARPAL TUNNEL RELEASE;  Surgeon: Leanor Kail, MD;  Location: [facility] ORS;  Service: Orthopedics;  Laterality: Bilateral;   BLEPHAROPLASTY     CARDIAC CATHETERIZATION     1 stent    CARPAL TUNNEL RELEASE  2005   right   CHOLECYSTECTOMY  2008   COLONOSCOPY     COLONOSCOPY WITH PROPOFOL N/A 02/13/2018   Procedure: COLONOSCOPY WITH PROPOFOL;  Surgeon: Lollie Sails, MD;  Location: [facility] ENDOSCOPY;  Service: Endoscopy;  Laterality: N/A;   CORONARY STENT INTERVENTION N/A 06/14/2016   Procedure: Coronary Stent Intervention;  Surgeon: Wellington Hampshire, MD;  Location: [place] CV LAB;  Service: Cardiovascular;  Laterality: N/A;   CYST EXCISION  04/14/2015   tendon sheath cyst excision; right ring finger cyst removed and trigger finger realease   ESOPHAGOGASTRODUODENOSCOPY     ESOPHAGOGASTRODUODENOSCOPY (EGD) WITH PROPOFOL N/A 05/29/2015   Procedure: ESOPHAGOGASTRODUODENOSCOPY (EGD) WITH PROPOFOL;  Surgeon: Lollie Sails, MD;  Location: [facility]  ENDOSCOPY;  Service: Endoscopy;  Laterality: N/A;   ESOPHAGOGASTRODUODENOSCOPY (EGD) WITH PROPOFOL N/A 07/04/2015   Procedure: ESOPHAGOGASTRODUODENOSCOPY (EGD) WITH PROPOFOL;  Surgeon: Lollie Sails, MD;  Location: [facility] ENDOSCOPY;  Service: Endoscopy;  Laterality: N/A;   ESOPHAGOGASTRODUODENOSCOPY (EGD) WITH PROPOFOL N/A 01/04/2016   Procedure: ESOPHAGOGASTRODUODENOSCOPY (EGD) WITH PROPOFOL;  Surgeon: Lollie Sails, MD;  Location: [facility] ENDOSCOPY;  Service: Endoscopy;  Laterality: N/A;   ESOPHAGOGASTRODUODENOSCOPY (EGD) WITH PROPOFOL N/A 03/22/2016   Procedure: ESOPHAGOGASTRODUODENOSCOPY (EGD) WITH PROPOFOL;  Surgeon: Lollie Sails, MD;  Location: [facility] ENDOSCOPY;  Service: Endoscopy;  Laterality: N/A;   ESOPHAGOGASTRODUODENOSCOPY (EGD) WITH PROPOFOL N/A 02/14/2017   Procedure: ESOPHAGOGASTRODUODENOSCOPY (EGD) WITH PROPOFOL;  Surgeon: Lollie Sails, MD;  Location: [facility] ENDOSCOPY;  Service: Endoscopy;  Laterality: N/A;   ESOPHAGOGASTRODUODENOSCOPY (EGD) WITH PROPOFOL N/A 04/11/2017   Procedure: ESOPHAGOGASTRODUODENOSCOPY (EGD) WITH PROPOFOL;  Surgeon: Lollie Sails, MD;  Location: [facility] ENDOSCOPY;  Service: Endoscopy;  Laterality: N/A;   ESOPHAGOGASTRODUODENOSCOPY (EGD) WITH PROPOFOL N/A 07/07/2017   Procedure: ESOPHAGOGASTRODUODENOSCOPY (EGD) WITH PROPOFOL;  Surgeon: Lucilla Lame, MD;  Location: [facility] ENDOSCOPY;  Service: Endoscopy;  Laterality: N/A;   ESOPHAGOGASTRODUODENOSCOPY (EGD) WITH PROPOFOL N/A 02/13/2018   Procedure: ESOPHAGOGASTRODUODENOSCOPY (EGD) WITH PROPOFOL;  Surgeon: Lollie Sails, MD;  Location: [facility] ENDOSCOPY;  Service: Endoscopy;  Laterality: N/A;   EYE SURGERY     FOREIGN BODY REMOVAL N/A 12/03/2015   Procedure: FOREIGN BODY REMOVAL;  Surgeon: Wilford Corner, MD;  Location: [facility] ENDOSCOPY;  Service: Endoscopy;  Laterality: N/A;   HERNIA REPAIR Right 1970   inguinal   INGUINAL HERNIA REPAIR Left 01/01/2018   Procedure: OPEN HERNIA REPAIR INGUINAL ADULT;  Surgeon: Olean Ree, MD;  Location: [facility] ORS;  Service: General;  Laterality: Left;   LEFT HEART CATH Left 06/14/2016   Procedure: Left Heart Cath;  Surgeon: Wellington Hampshire, MD;  Location: [place] CV LAB;  Service: Cardiovascular;  Laterality: Left;   LEFT HEART CATH AND CORONARY ANGIOGRAPHY Left 09/14/2018   Procedure: LEFT HEART CATH AND CORONARY ANGIOGRAPHY;  Surgeon: Wellington Hampshire, MD;  Location: [place] CV LAB;  Service: Cardiovascular;  Laterality: Left;   LUMBAR DISC SURGERY  2004   injections; herniated disc; percutaneous discectomy   NASAL SINUS SURGERY  2008   percutaneous lumbar discectomy     ROTATOR  CUFF REPAIR Right    TONSILLECTOMY     Allergies  Allergen Reactions   Ephedrine Other (See Comments)    Causing prostate to hurt. Using for sinus bronchodilators Causing prostate to hurt. Using for sinus bronchodilators Pain in groin, prostate   Nsaids Other (See Comments)    GI upset, history of esophagitis and Mallory-Weis tear   Other Rash, Other (See Comments) and Nausea Only    Paper tape causes rash Plastic tape is okay GI upset, history of esophagitis and Mallory-Weis tear   Vancomycin Itching and Rash    Same as pcn with itching   Amoxicillin Itching and Rash   Hyoscyamine Sulfate Other (See Comments)    Urinary retention Urine retention   Levsin [Hyoscyamine Sulfate] Other (See Comments)    Urinary retention   Naproxen Other (See Comments)    GI upset Also happens with other NSAIDS  GI upset Also happens with other NSAIDS GI upset   Penicillins Itching and Rash    Has patient had a PCN reaction causing immediate rash, facial/tongue/throat swelling, SOB or lightheadedness with hypotension: yes Has patient had a PCN reaction causing severe rash involving mucus membranes or skin  necrosis: no Has patient had a PCN reaction that required hospitalization: no Has patient had a PCN reaction occurring within the last 10 years: no If all of the above answers are "NO", then may proceed with Cephalosporin use.    Shellfish Allergy Rash    Ingested shellfish cCuses rash and itching along with stomach sickness. Pt tolerates betadine    Sudafed [Pseudoephedrine Hcl] Other (See Comments)    Constricts prostate flow (from ephedrine d/t sinus meds)      Objective/Physical Exam Neurovascular status intact.  Skin incisions appear to be well coapted with staples intact however there is maceration along the incision site with periincisional edema with erythema concerning for postoperative infection.  Increased pain and tenderness also associated to the area.  There is some serous  purulent drainage noted especially with removal of the staples.  No malodor   Assessment: 1. s/p fifth metatarsal head resection left foot. DOS: 12/28/2020   Plan of Care:  1. Patient was evaluated.  2.  Staples were removed today 3.  Patient is currently wearing wide fitting tennis shoes and he says this does not rub the area.  He declined a postoperative shoe today.  Advised the patient to protect the area and avoid any irritation or rubbing 4.  Prescription for doxycycline 100 mg 2 times daily #20 5.  Recommend that the patient apply Betadine daily to the area.  Betadine was provided 6.  Return to clinic in 1 week  Edrick Kins, DPM [facility]  Dr. Edrick Kins, DPM    [address], [place] [address]                Office [phone]  Fax [phone]

## 2021-01-27 NOTE — Progress Notes (Signed)
Subjective:  Patient presents today status post fifth metatarsal head resection left foot. DOS: 12/28/2020.  Patient states that the pain has actually increased over the past week.  He continues to take the oral doxycycline that was prescribed last visit.  He is concerned for infection.  He presents for follow-up treatment and evaluation.  Patient states that he stopped wearing the sneaker and is back into the cam boot.  Past Medical History:  Diagnosis Date   Abnormal liver function tests    Allergic state    Anemia    Arthritis    spine, hands, shoulder with previous cuff tear   CAD (coronary artery disease)    a.  11/2015 CT Chest/Abd: Coronary Ca2+ noted;  b. MV: EF 45-54%, med defect of mod severity in basal infsept, basal inf, mid infsept, and mid inf region-> infarct and peri-infarct ischemia-->intermediate risk; c. 05/2016 Cath/PCI: LM 40ost, LAD 30p, 44m, D1 60ost, LCX 60ost, OM1 99 (small->Med Rx), RCA 60p, 95d (2.5x15 Resolute Onyx DES), RPDA/RPLB min irregs, D2 60, EF 55-65%/   Candidiasis of esophagus (HCC)    Colon polyp    DDD (degenerative disc disease), lumbosacral    Dysphagia    Elevated transaminase level    Esophageal stricture    a. s/p multiple dilations - last 03/2016.   Esophagitis, [place] grade D    Ganglion cyst of finger of right hand    GERD (gastroesophageal reflux disease)    Guaiac positive stools    Hiatal hernia    HNP (herniated nucleus pulposus), lumbar    Hypercholesterolemia    Hypertension    Lumbar radiculitis    Mallory-Weiss tear    a. 11/2015.   Migraine aura without headache    migraine, visual   Psoriasis    with psoriatic arthritis   Psoriatic arthritis ([place])    severe   Schatzki's ring    Sleep apnea    does not uses CPAP. has lost weight so is much better.   Type II diabetes mellitus ([place])      Past Surgical History:  Procedure Laterality Date   ARTHROPLASTY     right thumb; left thumb with 2 screws and plastic joint    BALLOON DILATION N/A 05/29/2015   Procedure: BALLOON DILATION;  Surgeon: Lollie Sails, MD;  Location: [facility] ENDOSCOPY;  Service: Endoscopy;  Laterality: N/A;   BALLOON DILATION N/A 02/14/2017   Procedure: BALLOON DILATION;  Surgeon: Lollie Sails, MD;  Location: [facility] ENDOSCOPY;  Service: Endoscopy;  Laterality: N/A;   BILATERAL CARPAL TUNNEL RELEASE Bilateral 07/04/2017   Procedure: BILATERAL CARPAL TUNNEL RELEASE;  Surgeon: Leanor Kail, MD;  Location: [facility] ORS;  Service: Orthopedics;  Laterality: Bilateral;   BLEPHAROPLASTY     CARDIAC CATHETERIZATION     1 stent    CARPAL TUNNEL RELEASE  2005   right   CHOLECYSTECTOMY  2008   COLONOSCOPY     COLONOSCOPY WITH PROPOFOL N/A 02/13/2018   Procedure: COLONOSCOPY WITH PROPOFOL;  Surgeon: Lollie Sails, MD;  Location: [facility] ENDOSCOPY;  Service: Endoscopy;  Laterality: N/A;   CORONARY STENT INTERVENTION N/A 06/14/2016   Procedure: Coronary Stent Intervention;  Surgeon: Wellington Hampshire, MD;  Location: [place] CV LAB;  Service: Cardiovascular;  Laterality: N/A;   CYST EXCISION  04/14/2015   tendon sheath cyst excision; right ring finger cyst removed and trigger finger realease   ESOPHAGOGASTRODUODENOSCOPY     ESOPHAGOGASTRODUODENOSCOPY (EGD) WITH PROPOFOL N/A 05/29/2015   Procedure: ESOPHAGOGASTRODUODENOSCOPY (EGD) WITH  PROPOFOL;  Surgeon: Lollie Sails, MD;  Location: [facility] ENDOSCOPY;  Service: Endoscopy;  Laterality: N/A;   ESOPHAGOGASTRODUODENOSCOPY (EGD) WITH PROPOFOL N/A 07/04/2015   Procedure: ESOPHAGOGASTRODUODENOSCOPY (EGD) WITH PROPOFOL;  Surgeon: Lollie Sails, MD;  Location: [facility] ENDOSCOPY;  Service: Endoscopy;  Laterality: N/A;   ESOPHAGOGASTRODUODENOSCOPY (EGD) WITH PROPOFOL N/A 01/04/2016   Procedure: ESOPHAGOGASTRODUODENOSCOPY (EGD) WITH PROPOFOL;  Surgeon: Lollie Sails, MD;  Location: [facility] ENDOSCOPY;  Service: Endoscopy;  Laterality: N/A;   ESOPHAGOGASTRODUODENOSCOPY (EGD) WITH PROPOFOL N/A 03/22/2016    Procedure: ESOPHAGOGASTRODUODENOSCOPY (EGD) WITH PROPOFOL;  Surgeon: Lollie Sails, MD;  Location: [facility] ENDOSCOPY;  Service: Endoscopy;  Laterality: N/A;   ESOPHAGOGASTRODUODENOSCOPY (EGD) WITH PROPOFOL N/A 02/14/2017   Procedure: ESOPHAGOGASTRODUODENOSCOPY (EGD) WITH PROPOFOL;  Surgeon: Lollie Sails, MD;  Location: [facility] ENDOSCOPY;  Service: Endoscopy;  Laterality: N/A;   ESOPHAGOGASTRODUODENOSCOPY (EGD) WITH PROPOFOL N/A 04/11/2017   Procedure: ESOPHAGOGASTRODUODENOSCOPY (EGD) WITH PROPOFOL;  Surgeon: Lollie Sails, MD;  Location: [facility] ENDOSCOPY;  Service: Endoscopy;  Laterality: N/A;   ESOPHAGOGASTRODUODENOSCOPY (EGD) WITH PROPOFOL N/A 07/07/2017   Procedure: ESOPHAGOGASTRODUODENOSCOPY (EGD) WITH PROPOFOL;  Surgeon: Lucilla Lame, MD;  Location: [facility] ENDOSCOPY;  Service: Endoscopy;  Laterality: N/A;   ESOPHAGOGASTRODUODENOSCOPY (EGD) WITH PROPOFOL N/A 02/13/2018   Procedure: ESOPHAGOGASTRODUODENOSCOPY (EGD) WITH PROPOFOL;  Surgeon: Lollie Sails, MD;  Location: [facility] ENDOSCOPY;  Service: Endoscopy;  Laterality: N/A;   EYE SURGERY     FOREIGN BODY REMOVAL N/A 12/03/2015   Procedure: FOREIGN BODY REMOVAL;  Surgeon: Wilford Corner, MD;  Location: [facility] ENDOSCOPY;  Service: Endoscopy;  Laterality: N/A;   HERNIA REPAIR Right 1970   inguinal   INGUINAL HERNIA REPAIR Left 01/01/2018   Procedure: OPEN HERNIA REPAIR INGUINAL ADULT;  Surgeon: Olean Ree, MD;  Location: [facility] ORS;  Service: General;  Laterality: Left;   LEFT HEART CATH Left 06/14/2016   Procedure: Left Heart Cath;  Surgeon: Wellington Hampshire, MD;  Location: [place] CV LAB;  Service: Cardiovascular;  Laterality: Left;   LEFT HEART CATH AND CORONARY ANGIOGRAPHY Left 09/14/2018   Procedure: LEFT HEART CATH AND CORONARY ANGIOGRAPHY;  Surgeon: Wellington Hampshire, MD;  Location: [place] CV LAB;  Service: Cardiovascular;  Laterality: Left;   LUMBAR DISC SURGERY  2004   injections; herniated disc; percutaneous discectomy    NASAL SINUS SURGERY  2008   percutaneous lumbar discectomy     ROTATOR CUFF REPAIR Right    TONSILLECTOMY     Allergies  Allergen Reactions   Ephedrine Other (See Comments)    Causing prostate to hurt. Using for sinus bronchodilators Causing prostate to hurt. Using for sinus bronchodilators Pain in groin, prostate   Nsaids Other (See Comments)    GI upset, history of esophagitis and Mallory-Weis tear   Other Rash, Other (See Comments) and Nausea Only    Paper tape causes rash Plastic tape is okay GI upset, history of esophagitis and Mallory-Weis tear   Vancomycin Itching and Rash    Same as pcn with itching   Amoxicillin Itching and Rash   Hyoscyamine Sulfate Other (See Comments)    Urinary retention Urine retention   Levsin [Hyoscyamine Sulfate] Other (See Comments)    Urinary retention   Naproxen Other (See Comments)    GI upset Also happens with other NSAIDS  GI upset Also happens with other NSAIDS GI upset   Penicillins Itching and Rash    Has patient had a PCN reaction causing immediate rash, facial/tongue/throat swelling, SOB or lightheadedness with hypotension: yes Has patient had a  PCN reaction causing severe rash involving mucus membranes or skin necrosis: no Has patient had a PCN reaction that required hospitalization: no Has patient had a PCN reaction occurring within the last 10 years: no If all of the above answers are "NO", then may proceed with Cephalosporin use.    Shellfish Allergy Rash    Ingested shellfish cCuses rash and itching along with stomach sickness. Pt tolerates betadine    Sudafed [Pseudoephedrine Hcl] Other (See Comments)    Constricts prostate flow (from ephedrine d/t sinus meds)    Objective/Physical Exam Neurovascular status intact.  Skin incisions appear to be well coapted with staples intact however there is increased purulence along the incision site despite being on the antibiotic doxycycline.  Increased pain and tenderness as  well to palpation.  There is some purulent drainage.  No significant malodor.  Assessment: 1. s/p fifth metatarsal head resection left foot. DOS: 12/28/2020   Plan of Care:  1. Patient was evaluated.  2.  Staples were removed today 3.  Culture taken today and sent to pathology of the purulent drainage 4.  Continue doxycycline 100 mg 2 times daily 5.  Ciprofloxacin 500 mg 2 times daily was added to the patient's antibiotic regimen for better coverage 6.  Continue the cam boot.  Advised against sneakers 7.  Return to clinic in 1 week  Edrick Kins, DPM [facility]  Dr. Edrick Kins, DPM    [address], [place] [address]                Office [phone]  Fax [phone]

## 2021-01-30 ENCOUNTER — Ambulatory Visit (INDEPENDENT_AMBULATORY_CARE_PROVIDER_SITE_OTHER): Payer: Medicare Other

## 2021-01-30 ENCOUNTER — Encounter: Payer: Self-pay | Admitting: Podiatry

## 2021-01-30 ENCOUNTER — Other Ambulatory Visit: Payer: Self-pay

## 2021-01-30 ENCOUNTER — Ambulatory Visit (INDEPENDENT_AMBULATORY_CARE_PROVIDER_SITE_OTHER): Payer: Medicare Other | Admitting: Podiatry

## 2021-01-30 DIAGNOSIS — Z9889 Other specified postprocedural states: Secondary | ICD-10-CM

## 2021-02-01 DIAGNOSIS — Q142 Congenital malformation of optic disc: Secondary | ICD-10-CM | POA: Diagnosis not present

## 2021-02-01 LAB — HM DIABETES EYE EXAM

## 2021-02-04 NOTE — Progress Notes (Signed)
Subjective:  Patient presents today status post fifth metatarsal head resection left foot. DOS: 12/28/2020.  Patient states that over the past week he is improved significantly.  He has noticed a significant reduction of the pain and redness and swelling to the foot.  No new complaints at this time  Past Medical History:  Diagnosis Date   Abnormal liver function tests    Allergic state    Anemia    Arthritis    spine, hands, shoulder with previous cuff tear   CAD (coronary artery disease)    a.  11/2015 CT Chest/Abd: Coronary Ca2+ noted;  b. MV: EF 45-54%, med defect of mod severity in basal infsept, basal inf, mid infsept, and mid inf region-> infarct and peri-infarct ischemia-->intermediate risk; c. 05/2016 Cath/PCI: LM 40ost, LAD 30p, 33m, D1 60ost, LCX 60ost, OM1 99 (small->Med Rx), RCA 60p, 95d (2.5x15 Resolute Onyx DES), RPDA/RPLB min irregs, D2 60, EF 55-65%/   Candidiasis of esophagus (HCC)    Colon polyp    DDD (degenerative disc disease), lumbosacral    Dysphagia    Elevated transaminase level    Esophageal stricture    a. s/p multiple dilations - last 03/2016.   Esophagitis, [place] grade D    Ganglion cyst of finger of right hand    GERD (gastroesophageal reflux disease)    Guaiac positive stools    Hiatal hernia    HNP (herniated nucleus pulposus), lumbar    Hypercholesterolemia    Hypertension    Lumbar radiculitis    Mallory-Weiss tear    a. 11/2015.   Migraine aura without headache    migraine, visual   Psoriasis    with psoriatic arthritis   Psoriatic arthritis ([place])    severe   Schatzki's ring    Sleep apnea    does not uses CPAP. has lost weight so is much better.   Type II diabetes mellitus ([place])      Past Surgical History:  Procedure Laterality Date   ARTHROPLASTY     right thumb; left thumb with 2 screws and plastic joint   BALLOON DILATION N/A 05/29/2015   Procedure: BALLOON DILATION;  Surgeon: Lollie Sails, MD;  Location: [facility]  ENDOSCOPY;  Service: Endoscopy;  Laterality: N/A;   BALLOON DILATION N/A 02/14/2017   Procedure: BALLOON DILATION;  Surgeon: Lollie Sails, MD;  Location: [facility] ENDOSCOPY;  Service: Endoscopy;  Laterality: N/A;   BILATERAL CARPAL TUNNEL RELEASE Bilateral 07/04/2017   Procedure: BILATERAL CARPAL TUNNEL RELEASE;  Surgeon: Leanor Kail, MD;  Location: [facility] ORS;  Service: Orthopedics;  Laterality: Bilateral;   BLEPHAROPLASTY     CARDIAC CATHETERIZATION     1 stent    CARPAL TUNNEL RELEASE  2005   right   CHOLECYSTECTOMY  2008   COLONOSCOPY     COLONOSCOPY WITH PROPOFOL N/A 02/13/2018   Procedure: COLONOSCOPY WITH PROPOFOL;  Surgeon: Lollie Sails, MD;  Location: [facility] ENDOSCOPY;  Service: Endoscopy;  Laterality: N/A;   CORONARY STENT INTERVENTION N/A 06/14/2016   Procedure: Coronary Stent Intervention;  Surgeon: Wellington Hampshire, MD;  Location: [place] CV LAB;  Service: Cardiovascular;  Laterality: N/A;   CYST EXCISION  04/14/2015   tendon sheath cyst excision; right ring finger cyst removed and trigger finger realease   ESOPHAGOGASTRODUODENOSCOPY     ESOPHAGOGASTRODUODENOSCOPY (EGD) WITH PROPOFOL N/A 05/29/2015   Procedure: ESOPHAGOGASTRODUODENOSCOPY (EGD) WITH PROPOFOL;  Surgeon: Lollie Sails, MD;  Location: [facility] ENDOSCOPY;  Service: Endoscopy;  Laterality: N/A;   ESOPHAGOGASTRODUODENOSCOPY (  EGD) WITH PROPOFOL N/A 07/04/2015   Procedure: ESOPHAGOGASTRODUODENOSCOPY (EGD) WITH PROPOFOL;  Surgeon: Lollie Sails, MD;  Location: [facility] ENDOSCOPY;  Service: Endoscopy;  Laterality: N/A;   ESOPHAGOGASTRODUODENOSCOPY (EGD) WITH PROPOFOL N/A 01/04/2016   Procedure: ESOPHAGOGASTRODUODENOSCOPY (EGD) WITH PROPOFOL;  Surgeon: Lollie Sails, MD;  Location: [facility] ENDOSCOPY;  Service: Endoscopy;  Laterality: N/A;   ESOPHAGOGASTRODUODENOSCOPY (EGD) WITH PROPOFOL N/A 03/22/2016   Procedure: ESOPHAGOGASTRODUODENOSCOPY (EGD) WITH PROPOFOL;  Surgeon: Lollie Sails, MD;  Location: [facility]  ENDOSCOPY;  Service: Endoscopy;  Laterality: N/A;   ESOPHAGOGASTRODUODENOSCOPY (EGD) WITH PROPOFOL N/A 02/14/2017   Procedure: ESOPHAGOGASTRODUODENOSCOPY (EGD) WITH PROPOFOL;  Surgeon: Lollie Sails, MD;  Location: [facility] ENDOSCOPY;  Service: Endoscopy;  Laterality: N/A;   ESOPHAGOGASTRODUODENOSCOPY (EGD) WITH PROPOFOL N/A 04/11/2017   Procedure: ESOPHAGOGASTRODUODENOSCOPY (EGD) WITH PROPOFOL;  Surgeon: Lollie Sails, MD;  Location: [facility] ENDOSCOPY;  Service: Endoscopy;  Laterality: N/A;   ESOPHAGOGASTRODUODENOSCOPY (EGD) WITH PROPOFOL N/A 07/07/2017   Procedure: ESOPHAGOGASTRODUODENOSCOPY (EGD) WITH PROPOFOL;  Surgeon: Lucilla Lame, MD;  Location: [facility] ENDOSCOPY;  Service: Endoscopy;  Laterality: N/A;   ESOPHAGOGASTRODUODENOSCOPY (EGD) WITH PROPOFOL N/A 02/13/2018   Procedure: ESOPHAGOGASTRODUODENOSCOPY (EGD) WITH PROPOFOL;  Surgeon: Lollie Sails, MD;  Location: [facility] ENDOSCOPY;  Service: Endoscopy;  Laterality: N/A;   EYE SURGERY     FOREIGN BODY REMOVAL N/A 12/03/2015   Procedure: FOREIGN BODY REMOVAL;  Surgeon: Wilford Corner, MD;  Location: [facility] ENDOSCOPY;  Service: Endoscopy;  Laterality: N/A;   HERNIA REPAIR Right 1970   inguinal   INGUINAL HERNIA REPAIR Left 01/01/2018   Procedure: OPEN HERNIA REPAIR INGUINAL ADULT;  Surgeon: Olean Ree, MD;  Location: [facility] ORS;  Service: General;  Laterality: Left;   LEFT HEART CATH Left 06/14/2016   Procedure: Left Heart Cath;  Surgeon: Wellington Hampshire, MD;  Location: [place] CV LAB;  Service: Cardiovascular;  Laterality: Left;   LEFT HEART CATH AND CORONARY ANGIOGRAPHY Left 09/14/2018   Procedure: LEFT HEART CATH AND CORONARY ANGIOGRAPHY;  Surgeon: Wellington Hampshire, MD;  Location: [place] CV LAB;  Service: Cardiovascular;  Laterality: Left;   LUMBAR DISC SURGERY  2004   injections; herniated disc; percutaneous discectomy   NASAL SINUS SURGERY  2008   percutaneous lumbar discectomy     ROTATOR CUFF REPAIR Right     TONSILLECTOMY     Allergies  Allergen Reactions   Ephedrine Other (See Comments)    Causing prostate to hurt. Using for sinus bronchodilators Causing prostate to hurt. Using for sinus bronchodilators Pain in groin, prostate   Nsaids Other (See Comments)    GI upset, history of esophagitis and Mallory-Weis tear   Other Rash, Other (See Comments) and Nausea Only    Paper tape causes rash Plastic tape is okay GI upset, history of esophagitis and Mallory-Weis tear   Vancomycin Itching and Rash    Same as pcn with itching   Amoxicillin Itching and Rash   Hyoscyamine Sulfate Other (See Comments)    Urinary retention Urine retention   Levsin [Hyoscyamine Sulfate] Other (See Comments)    Urinary retention   Naproxen Other (See Comments)    GI upset Also happens with other NSAIDS  GI upset Also happens with other NSAIDS GI upset   Penicillins Itching and Rash    Has patient had a PCN reaction causing immediate rash, facial/tongue/throat swelling, SOB or lightheadedness with hypotension: yes Has patient had a PCN reaction causing severe rash involving mucus membranes or skin necrosis: no Has patient had a PCN reaction that required  hospitalization: no Has patient had a PCN reaction occurring within the last 10 years: no If all of the above answers are "NO", then may proceed with Cephalosporin use.    Shellfish Allergy Rash    Ingested shellfish cCuses rash and itching along with stomach sickness. Pt tolerates betadine    Sudafed [Pseudoephedrine Hcl] Other (See Comments)    Constricts prostate flow (from ephedrine d/t sinus meds)    Objective/Physical Exam Neurovascular status intact.  Skin incisions are healing nicely.  No purulence noted today.  There is a small area of dehiscence along the incision site that measures approximately 0.1 x 0.1 x 0.2 cm.  Sanguinous drainage.  No purulence.  No malodor.  Significant reduction of the erythema and edema noted to the surgical  area.  Assessment: 1. s/p fifth metatarsal head resection left foot. DOS: 12/28/2020   Plan of Care:  1. Patient was evaluated.  Cultures reviewed 2.  Overall the patient is doing much better.  He has noticed a significant reduction in the pain and the redness with swelling 3.  Continue ciprofloxacin until the prescription is completed.  Based on cultures Cipro is appropriate for the patient 4.  Patient states that when he is at work he is mostly sitting.  Patient may return to work with rest as needed 5.  Return to clinic in 1 week   Edrick Kins, DPM [facility]  Dr. Edrick Kins, DPM    [address], [place] [address]                Office [phone]  Fax [phone]

## 2021-02-06 ENCOUNTER — Encounter: Payer: Medicare Other | Admitting: Podiatry

## 2021-02-08 NOTE — Progress Notes (Signed)
Subjective:  Patient presents today status post fifth metatarsal head resection left foot. DOS: 12/28/2020.  Patient states that over the past week he is improved significantly.  He has noticed a significant reduction of the pain and redness and swelling to the foot.  No new complaints at this time  Past Medical History:  Diagnosis Date   Abnormal liver function tests    Allergic state    Anemia    Arthritis    spine, hands, shoulder with previous cuff tear   CAD (coronary artery disease)    a.  11/2015 CT Chest/Abd: Coronary Ca2+ noted;  b. MV: EF 45-54%, med defect of mod severity in basal infsept, basal inf, mid infsept, and mid inf region-> infarct and peri-infarct ischemia-->intermediate risk; c. 05/2016 Cath/PCI: LM 40ost, LAD 30p, 85m, D1 60ost, LCX 60ost, OM1 99 (small->Med Rx), RCA 60p, 95d (2.5x15 Resolute Onyx DES), RPDA/RPLB min irregs, D2 60, EF 55-65%/   Candidiasis of esophagus (HCC)    Colon polyp    DDD (degenerative disc disease), lumbosacral    Dysphagia    Elevated transaminase level    Esophageal stricture    a. s/p multiple dilations - last 03/2016.   Esophagitis, [place] grade D    Ganglion cyst of finger of right hand    GERD (gastroesophageal reflux disease)    Guaiac positive stools    Hiatal hernia    HNP (herniated nucleus pulposus), lumbar    Hypercholesterolemia    Hypertension    Lumbar radiculitis    Mallory-Weiss tear    a. 11/2015.   Migraine aura without headache    migraine, visual   Psoriasis    with psoriatic arthritis   Psoriatic arthritis ([place])    severe   Schatzki's ring    Sleep apnea    does not uses CPAP. has lost weight so is much better.   Type II diabetes mellitus ([place])      Past Surgical History:  Procedure Laterality Date   ARTHROPLASTY     right thumb; left thumb with 2 screws and plastic joint   BALLOON DILATION N/A 05/29/2015   Procedure: BALLOON DILATION;  Surgeon: Lollie Sails, MD;  Location: [facility]  ENDOSCOPY;  Service: Endoscopy;  Laterality: N/A;   BALLOON DILATION N/A 02/14/2017   Procedure: BALLOON DILATION;  Surgeon: Lollie Sails, MD;  Location: [facility] ENDOSCOPY;  Service: Endoscopy;  Laterality: N/A;   BILATERAL CARPAL TUNNEL RELEASE Bilateral 07/04/2017   Procedure: BILATERAL CARPAL TUNNEL RELEASE;  Surgeon: Leanor Kail, MD;  Location: [facility] ORS;  Service: Orthopedics;  Laterality: Bilateral;   BLEPHAROPLASTY     CARDIAC CATHETERIZATION     1 stent    CARPAL TUNNEL RELEASE  2005   right   CHOLECYSTECTOMY  2008   COLONOSCOPY     COLONOSCOPY WITH PROPOFOL N/A 02/13/2018   Procedure: COLONOSCOPY WITH PROPOFOL;  Surgeon: Lollie Sails, MD;  Location: [facility] ENDOSCOPY;  Service: Endoscopy;  Laterality: N/A;   CORONARY STENT INTERVENTION N/A 06/14/2016   Procedure: Coronary Stent Intervention;  Surgeon: Wellington Hampshire, MD;  Location: [facility];  Service: Cardiovascular;  Laterality: N/A;   CYST EXCISION  04/14/2015   tendon sheath cyst excision; right ring finger cyst removed and trigger finger realease   ESOPHAGOGASTRODUODENOSCOPY     ESOPHAGOGASTRODUODENOSCOPY (EGD) WITH PROPOFOL N/A 05/29/2015   Procedure: ESOPHAGOGASTRODUODENOSCOPY (EGD) WITH PROPOFOL;  Surgeon: Lollie Sails, MD;  Location: [facility] ENDOSCOPY;  Service: Endoscopy;  Laterality: N/A;   ESOPHAGOGASTRODUODENOSCOPY (  EGD) WITH PROPOFOL N/A 07/04/2015   Procedure: ESOPHAGOGASTRODUODENOSCOPY (EGD) WITH PROPOFOL;  Surgeon: Lollie Sails, MD;  Location: [facility] ENDOSCOPY;  Service: Endoscopy;  Laterality: N/A;   ESOPHAGOGASTRODUODENOSCOPY (EGD) WITH PROPOFOL N/A 01/04/2016   Procedure: ESOPHAGOGASTRODUODENOSCOPY (EGD) WITH PROPOFOL;  Surgeon: Lollie Sails, MD;  Location: [facility] ENDOSCOPY;  Service: Endoscopy;  Laterality: N/A;   ESOPHAGOGASTRODUODENOSCOPY (EGD) WITH PROPOFOL N/A 03/22/2016   Procedure: ESOPHAGOGASTRODUODENOSCOPY (EGD) WITH PROPOFOL;  Surgeon: Lollie Sails, MD;  Location: [facility]  ENDOSCOPY;  Service: Endoscopy;  Laterality: N/A;   ESOPHAGOGASTRODUODENOSCOPY (EGD) WITH PROPOFOL N/A 02/14/2017   Procedure: ESOPHAGOGASTRODUODENOSCOPY (EGD) WITH PROPOFOL;  Surgeon: Lollie Sails, MD;  Location: [facility] ENDOSCOPY;  Service: Endoscopy;  Laterality: N/A;   ESOPHAGOGASTRODUODENOSCOPY (EGD) WITH PROPOFOL N/A 04/11/2017   Procedure: ESOPHAGOGASTRODUODENOSCOPY (EGD) WITH PROPOFOL;  Surgeon: Lollie Sails, MD;  Location: [facility] ENDOSCOPY;  Service: Endoscopy;  Laterality: N/A;   ESOPHAGOGASTRODUODENOSCOPY (EGD) WITH PROPOFOL N/A 07/07/2017   Procedure: ESOPHAGOGASTRODUODENOSCOPY (EGD) WITH PROPOFOL;  Surgeon: Lucilla Lame, MD;  Location: [facility] ENDOSCOPY;  Service: Endoscopy;  Laterality: N/A;   ESOPHAGOGASTRODUODENOSCOPY (EGD) WITH PROPOFOL N/A 02/13/2018   Procedure: ESOPHAGOGASTRODUODENOSCOPY (EGD) WITH PROPOFOL;  Surgeon: Lollie Sails, MD;  Location: [facility] ENDOSCOPY;  Service: Endoscopy;  Laterality: N/A;   EYE SURGERY     FOREIGN BODY REMOVAL N/A 12/03/2015   Procedure: FOREIGN BODY REMOVAL;  Surgeon: Wilford Corner, MD;  Location: [facility] ENDOSCOPY;  Service: Endoscopy;  Laterality: N/A;   HERNIA REPAIR Right 1970   inguinal   INGUINAL HERNIA REPAIR Left 01/01/2018   Procedure: OPEN HERNIA REPAIR INGUINAL ADULT;  Surgeon: Olean Ree, MD;  Location: [facility] ORS;  Service: General;  Laterality: Left;   LEFT HEART CATH Left 06/14/2016   Procedure: Left Heart Cath;  Surgeon: Wellington Hampshire, MD;  Location: [place] [facility];  Service: Cardiovascular;  Laterality: Left;   LEFT HEART CATH AND CORONARY ANGIOGRAPHY Left 09/14/2018   Procedure: LEFT HEART CATH AND CORONARY ANGIOGRAPHY;  Surgeon: Wellington Hampshire, MD;  Location: [place] [facility];  Service: Cardiovascular;  Laterality: Left;   LUMBAR DISC SURGERY  2004   injections; herniated disc; percutaneous discectomy   NASAL SINUS SURGERY  2008   percutaneous lumbar discectomy     ROTATOR CUFF REPAIR Right     TONSILLECTOMY     Allergies  Allergen Reactions   Ephedrine Other (See Comments)    Causing prostate to hurt. Using for sinus bronchodilators Causing prostate to hurt. Using for sinus bronchodilators Pain in groin, prostate   Nsaids Other (See Comments)    GI upset, history of esophagitis and Mallory-Weis tear   Other Rash, Other (See Comments) and Nausea Only    Paper tape causes rash Plastic tape is okay GI upset, history of esophagitis and Mallory-Weis tear   Vancomycin Itching and Rash    Same as pcn with itching   Amoxicillin Itching and Rash   Hyoscyamine Sulfate Other (See Comments)    Urinary retention Urine retention   Levsin [Hyoscyamine Sulfate] Other (See Comments)    Urinary retention   Naproxen Other (See Comments)    GI upset Also happens with other NSAIDS  GI upset Also happens with other NSAIDS GI upset   Penicillins Itching and Rash    Has patient had a PCN reaction causing immediate rash, facial/tongue/throat swelling, SOB or lightheadedness with hypotension: yes Has patient had a PCN reaction causing severe rash involving mucus membranes or skin necrosis: no Has patient had a PCN reaction that required  hospitalization: no Has patient had a PCN reaction occurring within the last 10 years: no If all of the above answers are "NO", then may proceed with Cephalosporin use.    Shellfish Allergy Rash    Ingested shellfish cCuses rash and itching along with stomach sickness. Pt tolerates betadine    Sudafed [Pseudoephedrine Hcl] Other (See Comments)    Constricts prostate flow (from ephedrine d/t sinus meds)    Objective/Physical Exam Neurovascular status intact.  Skin incisions are healing nicely.  No purulence noted today.  The small area of dehiscence along the incision site has resolved.  Significant reduction of the erythema and edema around the foot.  Radiographic exam Osteotomy site appears to be healing appropriately with some remodeling over the  distal portion of the osteotomy.  No radiolucencies or gas within the tissue.  Assessment: 1. s/p fifth metatarsal head resection left foot. DOS: 12/28/2020   Plan of Care:  1. Patient was evaluated.   2.  Overall there is significant improvement.  Significant improvement of the pain and swelling. 3.  Continue the oral antibiotics until completed as prescribed 4.  Continue weightbearing in the postsurgical shoe 5.  Return to clinic in 2 weeks   Edrick Kins, DPM [facility]  Dr. Edrick Kins, DPM    [address], [place] [address]                Office [phone]  Fax [phone]

## 2021-02-15 ENCOUNTER — Ambulatory Visit (INDEPENDENT_AMBULATORY_CARE_PROVIDER_SITE_OTHER): Payer: Medicare Other

## 2021-02-15 VITALS — Ht 68.5 in | Wt 178.0 lb

## 2021-02-15 DIAGNOSIS — Z0000 Encounter for general adult medical examination without abnormal findings: Secondary | ICD-10-CM | POA: Diagnosis not present

## 2021-02-15 NOTE — Progress Notes (Signed)
Subjective:   Shane Mcknight is a 71 y.o. male who presents for Medicare Annual/Subsequent preventive examination.  Review of Systems    No ROS.  Medicare Wellness Virtual Visit.  Visual/audio telehealth visit, UTA vital signs.   See social history for additional risk factors.   Cardiac Risk Factors include: advanced age (>40men, >33 women);male gender;hypertension;diabetes mellitus     Objective:    Today's Vitals   02/15/21 1232  Weight: 178 lb (80.7 kg)  Height: 5' 8.5" (1.74 m)   Body mass index is 26.67 kg/m.  Advanced Directives 02/15/2021 02/15/2020 02/12/2019 09/22/2018 09/14/2018 02/13/2018 01/01/2018  Does Patient Have a Medical Advance Directive? Yes Yes Yes Yes Yes Yes Yes  Type of Paramedic of [place];Living will [place];Living will Healthcare Power of [place] of [place];Living will Living will [place];Living will  Does patient want to make changes to medical advance directive? No - Patient declined No - Patient declined No - Patient declined No - Patient declined - - -  Copy of [place] in Chart? No - copy requested No - copy requested No - copy requested No - copy requested No - copy requested - -  Would patient like information on creating a medical advance directive? - - - No - Patient declined - - -    Current Medications (verified) Outpatient Encounter Medications as of 02/15/2021  Medication Sig   acetaminophen (TYLENOL) 500 MG tablet Take 1,000 mg by mouth daily as needed for moderate pain.   aspirin EC 81 MG tablet Take 81 mg by mouth every evening.    atenolol (TENORMIN) 50 MG tablet TAKE 1 TABLET BY MOUTH  TWICE DAILY   COSENTYX SENSOREADY PEN 150 MG/ML SOAJ Inject 300 mg into the skin every 30 (thirty) days.    cyanocobalamin (,VITAMIN B-12,) 1000 MCG/ML injection Inject 1 mL (1,000 mcg total) into the muscle every 30 (thirty)  days.   folic acid (FOLVITE) 1 MG tablet Take 1 mg by mouth daily.   gabapentin (NEURONTIN) 300 MG capsule TAKE 1 TO 2 CAPSULES BY  MOUTH AT BEDTIME   isosorbide mononitrate (IMDUR) 30 MG 24 hr tablet Take 0.5 tablets (15 mg total) by mouth daily.   losartan (COZAAR) 25 MG tablet TAKE ONE-HALF TABLET BY  MOUTH DAILY   metFORMIN (GLUCOPHAGE) 500 MG tablet TAKE 2 TABLETS BY MOUTH  TWICE DAILY WITH A MEAL   methotrexate (RHEUMATREX) 2.5 MG tablet Take 8 tablets (20 mg total) by mouth once a week. Take 8 tablets every Friday. (Patient not taking: Reported on 10/24/2020)   Multiple Vitamin (MULTIVITAMIN) tablet Take 1 tablet by mouth daily. (Patient not taking: Reported on 10/24/2020)   nitroGLYCERIN (NITROSTAT) 0.4 MG SL tablet Place 1 tablet (0.4 mg total) under the tongue every 5 (five) minutes as needed. MAXIMUM OF 3 DOSES.   oxyCODONE-acetaminophen (PERCOCET) 5-325 MG tablet Take 1 tablet by mouth every 4 (four) hours as needed for severe pain.   pantoprazole (PROTONIX) 40 MG tablet TAKE 1 TABLET BY MOUTH  TWICE DAILY   rosuvastatin (CRESTOR) 10 MG tablet TAKE 1 TABLET BY MOUTH  DAILY   SYRINGE-NEEDLE, DISP, 3 ML 25G X 5/8" 3 ML MISC Use as instructed with B12 injection.   triamcinolone cream (KENALOG) 0.5 % Apply 1 application topically 2 (two) times daily as needed (scaling).   vitamin E 400 UNIT capsule Take 400 Units by mouth every evening.   [  DISCONTINUED] doxycycline (VIBRA-TABS) 100 MG tablet Take 1 tablet (100 mg total) by mouth 2 (two) times daily.   No facility-administered encounter medications on file as of 02/15/2021.    Allergies (verified) Ephedrine, Nsaids, Other, Vancomycin, Amoxicillin, Hyoscyamine sulfate, Levsin [hyoscyamine sulfate], Naproxen, Penicillins, Shellfish allergy, and Sudafed [pseudoephedrine hcl]   History: Past Medical History:  Diagnosis Date   Abnormal liver function tests    Allergic state    Anemia    Arthritis    spine, hands, shoulder with previous  cuff tear   CAD (coronary artery disease)    a.  11/2015 CT Chest/Abd: Coronary Ca2+ noted;  b. MV: EF 45-54%, med defect of mod severity in basal infsept, basal inf, mid infsept, and mid inf region-> infarct and peri-infarct ischemia-->intermediate risk; c. 05/2016 Cath/PCI: LM 40ost, LAD 30p, 82m, D1 60ost, LCX 60ost, OM1 99 (small->Med Rx), RCA 60p, 95d (2.5x15 Resolute Onyx DES), RPDA/RPLB min irregs, D2 60, EF 55-65%/   Candidiasis of esophagus (HCC)    Colon polyp    DDD (degenerative disc disease), lumbosacral    Dysphagia    Elevated transaminase level    Esophageal stricture    a. s/p multiple dilations - last 03/2016.   Esophagitis, [place] grade D    Ganglion cyst of finger of right hand    GERD (gastroesophageal reflux disease)    Guaiac positive stools    Hiatal hernia    HNP (herniated nucleus pulposus), lumbar    Hypercholesterolemia    Hypertension    Lumbar radiculitis    Mallory-Weiss tear    a. 11/2015.   Migraine aura without headache    migraine, visual   Psoriasis    with psoriatic arthritis   Psoriatic arthritis ([place])    severe   Schatzki's ring    Sleep apnea    does not uses CPAP. has lost weight so is much better.   Type II diabetes mellitus ([place])    Past Surgical History:  Procedure Laterality Date   ARTHROPLASTY     right thumb; left thumb with 2 screws and plastic joint   BALLOON DILATION N/A 05/29/2015   Procedure: BALLOON DILATION;  Surgeon: Lollie Sails, MD;  Location: [facility] ENDOSCOPY;  Service: Endoscopy;  Laterality: N/A;   BALLOON DILATION N/A 02/14/2017   Procedure: BALLOON DILATION;  Surgeon: Lollie Sails, MD;  Location: [facility] ENDOSCOPY;  Service: Endoscopy;  Laterality: N/A;   BILATERAL CARPAL TUNNEL RELEASE Bilateral 07/04/2017   Procedure: BILATERAL CARPAL TUNNEL RELEASE;  Surgeon: Leanor Kail, MD;  Location: [facility] ORS;  Service: Orthopedics;  Laterality: Bilateral;   BLEPHAROPLASTY     CARDIAC CATHETERIZATION     1 stent     CARPAL TUNNEL RELEASE  2005   right   CHOLECYSTECTOMY  2008   COLONOSCOPY     COLONOSCOPY WITH PROPOFOL N/A 02/13/2018   Procedure: COLONOSCOPY WITH PROPOFOL;  Surgeon: Lollie Sails, MD;  Location: [facility] ENDOSCOPY;  Service: Endoscopy;  Laterality: N/A;   CORONARY STENT INTERVENTION N/A 06/14/2016   Procedure: Coronary Stent Intervention;  Surgeon: Wellington Hampshire, MD;  Location: [place] CV LAB;  Service: Cardiovascular;  Laterality: N/A;   CYST EXCISION  04/14/2015   tendon sheath cyst excision; right ring finger cyst removed and trigger finger realease   ESOPHAGOGASTRODUODENOSCOPY     ESOPHAGOGASTRODUODENOSCOPY (EGD) WITH PROPOFOL N/A 05/29/2015   Procedure: ESOPHAGOGASTRODUODENOSCOPY (EGD) WITH PROPOFOL;  Surgeon: Lollie Sails, MD;  Location: [facility] ENDOSCOPY;  Service: Endoscopy;  Laterality: N/A;  ESOPHAGOGASTRODUODENOSCOPY (EGD) WITH PROPOFOL N/A 07/04/2015   Procedure: ESOPHAGOGASTRODUODENOSCOPY (EGD) WITH PROPOFOL;  Surgeon: Lollie Sails, MD;  Location: [facility] ENDOSCOPY;  Service: Endoscopy;  Laterality: N/A;   ESOPHAGOGASTRODUODENOSCOPY (EGD) WITH PROPOFOL N/A 01/04/2016   Procedure: ESOPHAGOGASTRODUODENOSCOPY (EGD) WITH PROPOFOL;  Surgeon: Lollie Sails, MD;  Location: [facility] ENDOSCOPY;  Service: Endoscopy;  Laterality: N/A;   ESOPHAGOGASTRODUODENOSCOPY (EGD) WITH PROPOFOL N/A 03/22/2016   Procedure: ESOPHAGOGASTRODUODENOSCOPY (EGD) WITH PROPOFOL;  Surgeon: Lollie Sails, MD;  Location: [facility] ENDOSCOPY;  Service: Endoscopy;  Laterality: N/A;   ESOPHAGOGASTRODUODENOSCOPY (EGD) WITH PROPOFOL N/A 02/14/2017   Procedure: ESOPHAGOGASTRODUODENOSCOPY (EGD) WITH PROPOFOL;  Surgeon: Lollie Sails, MD;  Location: [facility] END[facility]COPY;  Service: Endoscopy;  Laterality: N/A;   ESOPHAGOGASTRODUODENOSCOPY (EGD) WITH PROPOFOL N/A 04/11/2017   Procedure: ESOPHAGOGASTRODUODENOSCOPY (EGD) WITH PROPOFOL;  Surgeon: Lollie Sails, MD;  Location: [facility] ENDOSCOPY;  Service: Endoscopy;   Laterality: N/A;   ESOPHAGOGASTRODUODENOSCOPY (EGD) WITH PROPOFOL N/A 07/07/2017   Procedure: ESOPHAGOGASTRODUODENOSCOPY (EGD) WITH PROPOFOL;  Surgeon: Lucilla Lame, MD;  Location: [facility] ENDOSCOPY;  Service: Endoscopy;  Laterality: N/A;   ESOPHAGOGASTRODUODENOSCOPY (EGD) WITH PROPOFOL N/A 02/13/2018   Procedure: ESOPHAGOGASTRODUODENOSCOPY (EGD) WITH PROPOFOL;  Surgeon: Lollie Sails, MD;  Location: [facility] ENDOSCOPY;  Service: Endoscopy;  Laterality: N/A;   EYE SURGERY     FOREIGN BODY REMOVAL N/A 12/03/2015   Procedure: FOREIGN BODY REMOVAL;  Surgeon: Wilford Corner, MD;  Location: [facility] ENDOSCOPY;  Service: Endoscopy;  Laterality: N/A;   HERNIA REPAIR Right 1970   inguinal   INGUINAL HERNIA REPAIR Left 01/01/2018   Procedure: OPEN HERNIA REPAIR INGUINAL ADULT;  Surgeon: Olean Ree, MD;  Location: [facility] ORS;  Service: General;  Laterality: Left;   LEFT HEART CATH Left 06/14/2016   Procedure: Left Heart Cath;  Surgeon: Wellington Hampshire, MD;  Location: [place] CV LAB;  Service: Cardiovascular;  Laterality: Left;   LEFT HEART CATH AND CORONARY ANGIOGRAPHY Left 09/14/2018   Procedure: LEFT HEART CATH AND CORONARY ANGIOGRAPHY;  Surgeon: Wellington Hampshire, MD;  Location: [place] CV LAB;  Service: Cardiovascular;  Laterality: Left;   LUMBAR [place] SURGERY  2004   injections; herniated disc; percutaneous discectomy   NASAL SINUS SURGERY  2008   percutaneous lumbar discectomy     ROTATOR CUFF REPAIR Right    TONSILLECTOMY     Family History  Problem Relation Age of Onset   Lung cancer Father    Cancer Father    Hypertension Mother    Aneurysm Son    Social History   Socioeconomic History   Marital status: Married    Spouse name: Pamala Hurry   Number of children: Not on file   Years of education: Not on file   Highest education level: Not on file  Occupational History    Comment: not heavy work  Tobacco Use   Smoking status: Former    Types: Cigarettes    Quit date: 07/06/1982     Years since quitting: 38.6   Smokeless tobacco: Never  Vaping Use   Vaping Use: Never used  Substance and Sexual Activity   Alcohol use: Yes    Alcohol/week: 0.0 standard drinks   Drug use: No   Sexual activity: Not on file  Other Topics Concern   Not on file  Social History Narrative   Lives locally with wife.  Active but does not routinely exercise.   Social Determinants of Health   Financial Resource Strain: Low Risk    Difficulty of Paying Living Expenses: Not hard at  all  Food Insecurity: No Food Insecurity   Worried About Charity fundraiser in the Last Year: Never true   Ran Out of Food in the Last Year: Never true  Transportation Needs: No Transportation Needs   Lack of Transportation (Medical): No   Lack of Transportation (Non-Medical): No  Physical Activity: Not on file  Stress: No Stress Concern Present   Feeling of Stress : Not at all  Social Connections: Unknown   Frequency of Communication with Friends and Family: Not on file   Frequency of Social Gatherings with Friends and Family: Not on file   Attends Religious Services: Not on Electrical engineer or Organizations: Not on file   Attends Archivist Meetings: Not on file   Marital Status: Married    Tobacco Counseling Counseling given: Not Answered   Clinical Intake:  Pre-visit preparation completed: Yes        Diabetes: Yes (Followed by PCP)  How often do you need to have someone help you when you read instructions, pamphlets, or other written materials from your doctor or pharmacy?: 1 - Never  Nutrition Risk Assessment: Does the patient have any non-healing wounds?  No   Financial Strains and Diabetes Management: Are you having any financial strains with the device, your supplies or your medication? No .   Is the patient seen by Chronic Care Management for management of their diabetes?  Yes   Would the patient like to be referred to a Nutritionist or for Diabetic  Management?  No   Interpreter Needed?: No      Activities of Daily Living In your present state of health, do you have any difficulty performing the following activities: 02/15/2021  Hearing? N  Vision? N  Difficulty concentrating or making decisions? N  Walking or climbing stairs? N  Dressing or bathing? N  Doing errands, shopping? N  Preparing Food and eating ? N  Using the Toilet? N  In the past six months, have you accidently leaked urine? N  Do you have problems with loss of bowel control? N  Managing your Medications? N  Managing your Finances? N  Housekeeping or managing your Housekeeping? N  Some recent data might be hidden   Patient Care Team: Einar Pheasant, MD as PCP - General (Internal Medicine) Wellington Hampshire, MD as PCP - Cardiology (Cardiology) De Hollingshead, RPH-CPP (Pharmacist)  Indicate any recent Medical Services you may have received from other than [facility] providers in the past year (date may be approximate).     Assessment:   This is a routine wellness examination for [place].  Virtual Visit via Telephone Note  I connected with  Chancellor on 02/15/21 at 12:30 PM EST by telephone and verified that I am speaking with the correct person using two identifiers.  Persons participating in the virtual visit: patient/Nurse Health Advisor   I discussed the limitations, risks, security and privacy concerns of performing an evaluation and management service by telephone and the availability of in person appointments. The patient expressed understanding and agreed to proceed.  Interactive audio and video telecommunications were attempted between this nurse and patient, however failed, due to patient having technical difficulties OR patient did not have access to video capability.  We continued and completed visit with audio only.  Some vital signs may be absent or patient reported.   Hearing/Vision screen Hearing Screening - Comments:: Patient is able to  hear conversational tones without difficulty.  No  issues reported. Vision Screening - Comments:: Followed by [facility]  Wears corrective lenses  They have seen their ophthalmologist in the last 12 months  Dietary issues and exercise activities discussed:   High protein/low carb Good water intake   Goals Addressed             This Visit's Progress    Follow up with Primary Care Provider       As needed.       Depression Screen PHQ 2/9 Scores 02/15/2021 05/12/2020 02/15/2020 12/24/2019 02/12/2019 09/22/2018 02/21/2017  PHQ - 2 Score 0 0 0 0 0 0 0    Fall Risk Fall Risk  02/15/2021 05/12/2020 02/15/2020 12/24/2019 02/12/2019  Falls in the past year? 0 0 0 0 0  Number falls in past yr: 0 0 0 0 -  Injury with Fall? - 0 0 0 -  Follow up Falls evaluation completed Falls evaluation completed Falls evaluation completed Falls evaluation completed Falls evaluation completed    [place]: Home free of loose throw rugs in walkways, pet beds, electrical cords, etc? Yes  Adequate lighting in your home to reduce risk of falls? Yes   ASSISTIVE DEVICES UTILIZED TO PREVENT FALLS: Life alert? No  Use of a cane, walker or w/c? No   TIMED UP AND GO: Was the test performed? No .   Cognitive Function:  Patient is alert and oriented x3.    6CIT Screen 02/12/2019  What Year? 0 points  What month? 0 points  What time? 0 points  Count back from 20 0 points  Months in reverse 0 points  Repeat phrase 0 points  Total Score 0    Immunizations Immunization History  Administered Date(s) Administered   Janssen (J&J) SARS-COV-2 Vaccination 05/25/2019   Moderna Sars-Covid-2 Vaccination 11/08/2019   PNEUMOCOCCAL CONJUGATE-20 05/12/2020   Pneumococcal Polysaccharide-23 11/22/2016   Td 11/22/2016   Shingrix Completed?: No.    Education has been provided regarding the importance of this vaccine. Patient has been advised to call insurance company to determine out  of pocket expense if they have not yet received this vaccine. Advised may also receive vaccine at local pharmacy or Health Dept. Verbalized acceptance and understanding.  Screening Tests Health Maintenance  Topic Date Due   COVID-19 Vaccine (3 - Booster for Janssen series) 03/03/2021 (Originally 01/03/2020)   Zoster Vaccines- Shingrix (1 of 2) 05/15/2021 (Originally 10/02/1969)   HEMOGLOBIN A1C  05/08/2021   FOOT EXAM  05/12/2021   OPHTHALMOLOGY EXAM  08/24/2021   TETANUS/TDAP  11/23/2026   COLONOSCOPY (Pts 45-30yrs Insurance coverage will need to be confirmed)  02/14/2028   Pneumonia Vaccine 60+ Years old  Completed   Hepatitis C Screening  Completed   HPV VACCINES  Aged Out   INFLUENZA VACCINE  Discontinued   Health Maintenance There are no preventive care reminders to display for this patient.  Lung Cancer Screening: (Low Dose CT Chest recommended if Age 17-80 years, 30 pack-year currently smoking OR have quit w/in 15years.) does not qualify.   Vision Screening: Recommended annual ophthalmology exams for early detection of glaucoma and other disorders of the eye.  Dental Screening: Recommended annual dental exams for proper oral hygiene  Community Resource Referral / Chronic Care Management: CRR required this visit?  No   CCM required this visit?  No      Plan:   Keep all routine maintenance appointments.   I have personally reviewed and noted the following in the  patients chart:   Medical and social history Use of alcohol, tobacco or illicit drugs  Current medications and supplements including opioid prescriptions. Patient is currently taking opioid prescriptions. Information provided to patient regarding non-opioid alternatives. Patient advised to discuss non-opioid treatment plan with their provider. Functional ability and status Nutritional status Physical activity Advanced directives List of other physicians Hospitalizations, surgeries, and ER visits in previous  12 months Vitals Screenings to include cognitive, depression, and falls Referrals and appointments  In addition, I have reviewed and discussed with patient certain preventive protocols, quality metrics, and best practice recommendations. A written personalized care plan for preventive services as well as general preventive health recommendations were provided to patient via mychart.     Varney Biles, LPN   01/19/1094

## 2021-02-15 NOTE — Patient Instructions (Addendum)
[name] , Thank you for taking time to come for your Medicare Wellness Visit. I appreciate your ongoing commitment to your health goals. Please review the following plan we discussed and let me know if I can assist you in the future.   These are the goals we discussed:  Goals      Follow up with Primary Care Provider     As needed.        This is a list of the screening recommended for you and due dates:  Health Maintenance  Topic Date Due   COVID-19 Vaccine (3 - Booster for [employer] series) 03/03/2021*   Zoster (Shingles) Vaccine (1 of 2) 05/15/2021*   Hemoglobin A1C  05/08/2021   Complete foot exam   05/12/2021   Eye exam for diabetics  08/24/2021   Tetanus Vaccine  11/23/2026   Colon Cancer Screening  02/14/2028   Pneumonia Vaccine  Completed   Hepatitis C Screening: USPSTF Recommendation to screen - Ages 18-79 yo.  Completed   HPV Vaccine  Aged Out   Flu Shot  Discontinued  *Topic was postponed. The date shown is not the original due date.    Advanced directives:   Conditions/risks identified: none new.  Follow up in one year for your annual wellness visit.   Preventive Care 79 Years and Older, Male Preventive care refers to lifestyle choices and visits with your health care provider that can promote health and wellness. What does preventive care include? A yearly physical exam. This is also called an annual well check. Dental exams once or twice a year. Routine eye exams. Ask your health care provider how often you should have your eyes checked. Personal lifestyle choices, including: Daily care of your teeth and gums. Regular physical activity. Eating a healthy diet. Avoiding tobacco and drug use. Limiting alcohol use. Practicing safe sex. Taking low doses of aspirin every day. Taking vitamin and mineral supplements as recommended by your health care provider. What happens during an annual well check? The services and screenings done by your health care  provider during your annual well check will depend on your age, overall health, lifestyle risk factors, and family history of disease. Counseling  Your health care provider may ask you questions about your: Alcohol use. Tobacco use. Drug use. Emotional well-being. Home and relationship well-being. Sexual activity. Eating habits. History of falls. Memory and ability to understand (cognition). Work and work Statistician. Screening  You may have the following tests or measurements: Height, weight, and BMI. Blood pressure. Lipid and cholesterol levels. These may be checked every 5 years, or more frequently if you are over 91 years old. Skin check. Lung cancer screening. You may have this screening every year starting at age 87 if you have a 30-pack-year history of smoking and currently smoke or have quit within the past 15 years. Fecal occult blood test (FOBT) of the stool. You may have this test every year starting at age 81. Flexible sigmoidoscopy or colonoscopy. You may have a sigmoidoscopy every 5 years or a colonoscopy every 10 years starting at age 38. Prostate cancer screening. Recommendations will vary depending on your family history and other risks. Hepatitis C blood test. Hepatitis B blood test. Sexually transmitted disease (STD) testing. Diabetes screening. This is done by checking your blood sugar (glucose) after you have not eaten for a while (fasting). You may have this done every 1-3 years. Abdominal aortic aneurysm (AAA) screening. You may need this if you are a current or former  smoker. Osteoporosis. You may be screened starting at age 24 if you are at high risk. Talk with your health care provider about your test results, treatment options, and if necessary, the need for more tests. Vaccines  Your health care provider may recommend certain vaccines, such as: Influenza vaccine. This is recommended every year. Tetanus, diphtheria, and acellular pertussis (Tdap, Td)  vaccine. You may need a Td booster every 10 years. Zoster vaccine. You may need this after age 42. Pneumococcal 13-valent conjugate (PCV13) vaccine. One dose is recommended after age 42. Pneumococcal polysaccharide (PPSV23) vaccine. One dose is recommended after age 8. Talk to your health care provider about which screenings and vaccines you need and how often you need them. This information is not intended to replace advice given to you by your health care provider. Make sure you discuss any questions you have with your health care provider. Document Released: 01/27/2015 Document Revised: 09/20/2015 Document Reviewed: 11/01/2014 Elsevier Interactive Patient Education  2017 [employer] Prevention in the Home Falls can cause injuries. They can happen to people of all ages. There are many things you can do to make your home safe and to help prevent falls. What can I do on the outside of my home? Regularly fix the edges of walkways and driveways and fix any cracks. Remove anything that might make you trip as you walk through a door, such as a raised step or threshold. Trim any bushes or trees on the path to your home. Use bright outdoor lighting. Clear any walking paths of anything that might make someone trip, such as rocks or tools. Regularly check to see if handrails are loose or broken. Make sure that both sides of any steps have handrails. Any raised decks and porches should have guardrails on the edges. Have any leaves, snow, or ice cleared regularly. Use sand or salt on walking paths during winter. Clean up any spills in your garage right away. This includes oil or grease spills. What can I do in the bathroom? Use night lights. Install grab bars by the toilet and in the tub and shower. Do not use towel bars as grab bars. Use non-skid mats or decals in the tub or shower. If you need to sit down in the shower, use a plastic, non-slip stool. Keep the floor dry. Clean up any  water that spills on the floor as soon as it happens. Remove soap buildup in the tub or shower regularly. Attach bath mats securely with double-sided non-slip rug tape. Do not have throw rugs and other things on the floor that can make you trip. What can I do in the bedroom? Use night lights. Make sure that you have a light by your bed that is easy to reach. Do not use any sheets or blankets that are too big for your bed. They should not hang down onto the floor. Have a firm chair that has side arms. You can use this for support while you get dressed. Do not have throw rugs and other things on the floor that can make you trip. What can I do in the kitchen? Clean up any spills right away. Avoid walking on wet floors. Keep items that you use a lot in easy-to-reach places. If you need to reach something above you, use a strong step stool that has a grab bar. Keep electrical cords out of the way. Do not use floor polish or wax that makes floors slippery. If you must use wax, use non-skid  floor wax. Do not have throw rugs and other things on the floor that can make you trip. What can I do with my stairs? Do not leave any items on the stairs. Make sure that there are handrails on both sides of the stairs and use them. Fix handrails that are broken or loose. Make sure that handrails are as long as the stairways. Check any carpeting to make sure that it is firmly attached to the stairs. Fix any carpet that is loose or worn. Avoid having throw rugs at the top or bottom of the stairs. If you do have throw rugs, attach them to the floor with carpet tape. Make sure that you have a light switch at the top of the stairs and the bottom of the stairs. If you do not have them, ask someone to add them for you. What else can I do to help prevent falls? Wear shoes that: Do not have high heels. Have rubber bottoms. Are comfortable and fit you well. Are closed at the toe. Do not wear sandals. If you use a  stepladder: Make sure that it is fully opened. Do not climb a closed stepladder. Make sure that both sides of the stepladder are locked into place. Ask someone to hold it for you, if possible. Clearly mark and make sure that you can see: Any grab bars or handrails. First and last steps. Where the edge of each step is. Use tools that help you move around (mobility aids) if they are needed. These include: Canes. Walkers. Scooters. Crutches. Turn on the lights when you go into a dark area. Replace any light bulbs as soon as they burn out. Set up your furniture so you have a clear path. Avoid moving your furniture around. If any of your floors are uneven, fix them. If there are any pets around you, be aware of where they are. Review your medicines with your doctor. Some medicines can make you feel dizzy. This can increase your chance of falling. Ask your doctor what other things that you can do to help prevent falls. This information is not intended to replace advice given to you by your health care provider. Make sure you discuss any questions you have with your health care provider. Document Released: 10/27/2008 Document Revised: 06/08/2015 Document Reviewed: 02/04/2014 Elsevier Interactive Patient Education  2017 [employer].

## 2021-02-16 ENCOUNTER — Other Ambulatory Visit: Payer: Self-pay

## 2021-02-16 ENCOUNTER — Other Ambulatory Visit: Payer: Self-pay | Admitting: Podiatry

## 2021-02-16 ENCOUNTER — Ambulatory Visit (INDEPENDENT_AMBULATORY_CARE_PROVIDER_SITE_OTHER): Payer: Medicare Other | Admitting: Podiatry

## 2021-02-16 ENCOUNTER — Encounter: Payer: Self-pay | Admitting: Podiatry

## 2021-02-16 DIAGNOSIS — L03119 Cellulitis of unspecified part of limb: Secondary | ICD-10-CM | POA: Diagnosis not present

## 2021-02-16 DIAGNOSIS — L02619 Cutaneous abscess of unspecified foot: Secondary | ICD-10-CM | POA: Diagnosis not present

## 2021-02-16 DIAGNOSIS — Z9889 Other specified postprocedural states: Secondary | ICD-10-CM

## 2021-02-16 MED ORDER — CIPROFLOXACIN HCL 500 MG PO TABS: 500.0000 mg | ORAL_TABLET | Freq: Two times a day (BID) | ORAL | 0 refills | Status: AC

## 2021-02-16 NOTE — Progress Notes (Signed)
Subjective:  Patient presents today status post fifth metatarsal head resection left foot. DOS: 12/28/2020.  Patient states that he continues to have some improvement.  He does have occasional intermittent pain but it is becoming less and less and the overall he is feeling better.  The redness and swelling has decreased.  Past Medical History:  Diagnosis Date   Abnormal liver function tests    Allergic state    Anemia    Arthritis    spine, hands, shoulder with previous cuff tear   CAD (coronary artery disease)    a.  11/2015 CT Chest/Abd: Coronary Ca2+ noted;  b. MV: EF 45-54%, med defect of mod severity in basal infsept, basal inf, mid infsept, and mid inf region-> infarct and peri-infarct ischemia-->intermediate risk; c. 05/2016 Cath/PCI: LM 40ost, LAD 30p, 1m, D1 60ost, LCX 60ost, OM1 99 (small->Med Rx), RCA 60p, 95d (2.5x15 Resolute Onyx DES), RPDA/RPLB min irregs, D2 60, EF 55-65%/   Candidiasis of esophagus (HCC)    Colon polyp    DDD (degenerative disc disease), lumbosacral    Dysphagia    Elevated transaminase level    Esophageal stricture    a. s/p multiple dilations - last 03/2016.   Esophagitis, [place] grade D    Ganglion cyst of finger of right hand    GERD (gastroesophageal reflux disease)    Guaiac positive stools    Hiatal hernia    HNP (herniated nucleus pulposus), lumbar    Hypercholesterolemia    Hypertension    Lumbar radiculitis    Mallory-Weiss tear    a. 11/2015.   Migraine aura without headache    migraine, visual   Psoriasis    with psoriatic arthritis   Psoriatic arthritis ([place])    severe   Schatzki's ring    Sleep apnea    does not uses CPAP. has lost weight so is much better.   Type II diabetes mellitus ([place])      Past Surgical History:  Procedure Laterality Date   ARTHROPLASTY     right thumb; left thumb with 2 screws and plastic joint   BALLOON DILATION N/A 05/29/2015   Procedure: BALLOON DILATION;  Surgeon: Lollie Sails, MD;   Location: [facility] ENDOSCOPY;  Service: Endoscopy;  Laterality: N/A;   BALLOON DILATION N/A 02/14/2017   Procedure: BALLOON DILATION;  Surgeon: Lollie Sails, MD;  Location: [facility] ENDOSCOPY;  Service: Endoscopy;  Laterality: N/A;   BILATERAL CARPAL TUNNEL RELEASE Bilateral 07/04/2017   Procedure: BILATERAL CARPAL TUNNEL RELEASE;  Surgeon: Leanor Kail, MD;  Location: [facility] ORS;  Service: Orthopedics;  Laterality: Bilateral;   BLEPHAROPLASTY     CARDIAC CATHETERIZATION     1 stent    CARPAL TUNNEL RELEASE  2005   right   CHOLECYSTECTOMY  2008   COLONOSCOPY     COLONOSCOPY WITH PROPOFOL N/A 02/13/2018   Procedure: COLONOSCOPY WITH PROPOFOL;  Surgeon: Lollie Sails, MD;  Location: [facility] ENDOSCOPY;  Service: Endoscopy;  Laterality: N/A;   CORONARY STENT INTERVENTION N/A 06/14/2016   Procedure: Coronary Stent Intervention;  Surgeon: Wellington Hampshire, MD;  Location: [place] CV LAB;  Service: Cardiovascular;  Laterality: N/A;   CYST EXCISION  04/14/2015   tendon sheath cyst excision; right ring finger cyst removed and trigger finger realease   ESOPHAGOGASTRODUODENOSCOPY     ESOPHAGOGASTRODUODENOSCOPY (EGD) WITH PROPOFOL N/A 05/29/2015   Procedure: ESOPHAGOGASTRODUODENOSCOPY (EGD) WITH PROPOFOL;  Surgeon: Lollie Sails, MD;  Location: [facility] ENDOSCOPY;  Service: Endoscopy;  Laterality: N/A;  ESOPHAGOGASTRODUODENOSCOPY (EGD) WITH PROPOFOL N/A 07/04/2015   Procedure: ESOPHAGOGASTRODUODENOSCOPY (EGD) WITH PROPOFOL;  Surgeon: Lollie Sails, MD;  Location: [facility] ENDOSCOPY;  Service: Endoscopy;  Laterality: N/A;   ESOPHAGOGASTRODUODENOSCOPY (EGD) WITH PROPOFOL N/A 01/04/2016   Procedure: ESOPHAGOGASTRODUODENOSCOPY (EGD) WITH PROPOFOL;  Surgeon: Lollie Sails, MD;  Location: [facility] ENDOSCOPY;  Service: Endoscopy;  Laterality: N/A;   ESOPHAGOGASTRODUODENOSCOPY (EGD) WITH PROPOFOL N/A 03/22/2016   Procedure: ESOPHAGOGASTRODUODENOSCOPY (EGD) WITH PROPOFOL;  Surgeon: Lollie Sails, MD;   Location: [facility] ENDOSCOPY;  Service: Endoscopy;  Laterality: N/A;   ESOPHAGOGASTRODUODENOSCOPY (EGD) WITH PROPOFOL N/A 02/14/2017   Procedure: ESOPHAGOGASTRODUODENOSCOPY (EGD) WITH PROPOFOL;  Surgeon: Lollie Sails, MD;  Location: [facility] ENDOSCOPY;  Service: Endoscopy;  Laterality: N/A;   ESOPHAGOGASTRODUODENOSCOPY (EGD) WITH PROPOFOL N/A 04/11/2017   Procedure: ESOPHAGOGASTRODUODENOSCOPY (EGD) WITH PROPOFOL;  Surgeon: Lollie Sails, MD;  Location: [facility] ENDOSCOPY;  Service: Endoscopy;  Laterality: N/A;   ESOPHAGOGASTRODUODENOSCOPY (EGD) WITH PROPOFOL N/A 07/07/2017   Procedure: ESOPHAGOGASTRODUODENOSCOPY (EGD) WITH PROPOFOL;  Surgeon: Lucilla Lame, MD;  Location: [facility] ENDOSCOPY;  Service: Endoscopy;  Laterality: N/A;   ESOPHAGOGASTRODUODENOSCOPY (EGD) WITH PROPOFOL N/A 02/13/2018   Procedure: ESOPHAGOGASTRODUODENOSCOPY (EGD) WITH PROPOFOL;  Surgeon: Lollie Sails, MD;  Location: [facility] ENDOSCOPY;  Service: Endoscopy;  Laterality: N/A;   EYE SURGERY     FOREIGN BODY REMOVAL N/A 12/03/2015   Procedure: FOREIGN BODY REMOVAL;  Surgeon: Wilford Corner, MD;  Location: [facility] ENDOSCOPY;  Service: Endoscopy;  Laterality: N/A;   HERNIA REPAIR Right 1970   inguinal   INGUINAL HERNIA REPAIR Left 01/01/2018   Procedure: OPEN HERNIA REPAIR INGUINAL ADULT;  Surgeon: Olean Ree, MD;  Location: [facility] ORS;  Service: General;  Laterality: Left;   LEFT HEART CATH Left 06/14/2016   Procedure: Left Heart Cath;  Surgeon: Wellington Hampshire, MD;  Location: [place] CV LAB;  Service: Cardiovascular;  Laterality: Left;   LEFT HEART CATH AND CORONARY ANGIOGRAPHY Left 09/14/2018   Procedure: LEFT HEART CATH AND CORONARY ANGIOGRAPHY;  Surgeon: Wellington Hampshire, MD;  Location: [place] CV LAB;  Service: Cardiovascular;  Laterality: Left;   LUMBAR DISC SURGERY  2004   injections; herniated disc; percutaneous discectomy   NASAL SINUS SURGERY  2008   percutaneous lumbar discectomy     ROTATOR CUFF REPAIR Right     TONSILLECTOMY     Allergies  Allergen Reactions   Ephedrine Other (See Comments)    Causing prostate to hurt. Using for sinus bronchodilators Causing prostate to hurt. Using for sinus bronchodilators Pain in groin, prostate   Nsaids Other (See Comments)    GI upset, history of esophagitis and Mallory-Weis tear   Other Rash, Other (See Comments) and Nausea Only    Paper tape causes rash Plastic tape is okay GI upset, history of esophagitis and Mallory-Weis tear   Vancomycin Itching and Rash    Same as pcn with itching   Amoxicillin Itching and Rash   Hyoscyamine Sulfate Other (See Comments)    Urinary retention Urine retention   Levsin [Hyoscyamine Sulfate] Other (See Comments)    Urinary retention   Naproxen Other (See Comments)    GI upset Also happens with other NSAIDS  GI upset Also happens with other NSAIDS GI upset   Penicillins Itching and Rash    Has patient had a PCN reaction causing immediate rash, facial/tongue/throat swelling, SOB or lightheadedness with hypotension: yes Has patient had a PCN reaction causing severe rash involving mucus membranes or skin necrosis: no Has patient had a PCN reaction that  required hospitalization: no Has patient had a PCN reaction occurring within the last 10 years: no If all of the above answers are "NO", then may proceed with Cephalosporin use.    Shellfish Allergy Rash    Ingested shellfish cCuses rash and itching along with stomach sickness. Pt tolerates betadine    Sudafed [Pseudoephedrine Hcl] Other (See Comments)    Constricts prostate flow (from ephedrine d/t sinus meds)     Objective/Physical Exam Neurovascular status intact.  Skin incisions are healing nicely with exception of the central portion which does have a small area of dehiscence about 5 mm in length.  Granular wound base.  Pressure was applied to the area and the absorbable suture was "spit out".  This may be the reason why this area was not healing  appropriately   Assessment: 1. s/p fifth metatarsal head resection left foot. DOS: 12/28/2020   Plan of Care:  1. Patient was evaluated.   2.  Patient continues to see and feel improvement.  Due to the continued drainage that he notices on his bandage we are going to refill 1 final prescription of ciprofloxacin 500 mg 2 times daily #14 3.  Continue Betadine and a light dressing/Band-Aid daily 4.  The patient is currently not on antibiotics.  I did take a new culture today and sent to pathology 5.  Return to clinic in 2 weeks  Edrick Kins, DPM [facility]  Dr. Edrick Kins, DPM    [address], [place] [address]                Office [phone]  Fax [phone]

## 2021-02-19 LAB — SPECIMEN STATUS REPORT

## 2021-02-20 ENCOUNTER — Other Ambulatory Visit: Payer: Self-pay | Admitting: Podiatry

## 2021-02-20 ENCOUNTER — Telehealth: Payer: Self-pay | Admitting: Podiatry

## 2021-02-20 LAB — WOUND CULTURE: Organism ID, Bacteria: NONE SEEN

## 2021-02-20 MED ORDER — SULFAMETHOXAZOLE-TRIMETHOPRIM 800-160 MG PO TABS: 1.0000 | ORAL_TABLET | Freq: Two times a day (BID) | ORAL | 0 refills | Status: DC

## 2021-02-20 NOTE — Progress Notes (Signed)
As per cultures and sensitivities taken last visit

## 2021-03-02 ENCOUNTER — Other Ambulatory Visit: Payer: Self-pay

## 2021-03-02 ENCOUNTER — Ambulatory Visit (INDEPENDENT_AMBULATORY_CARE_PROVIDER_SITE_OTHER): Payer: Medicare Other | Admitting: Podiatry

## 2021-03-02 VITALS — Temp 98.6°F

## 2021-03-02 DIAGNOSIS — Z9889 Other specified postprocedural states: Secondary | ICD-10-CM

## 2021-03-08 ENCOUNTER — Other Ambulatory Visit: Payer: Self-pay | Admitting: Internal Medicine

## 2021-03-08 DIAGNOSIS — I5022 Chronic systolic (congestive) heart failure: Secondary | ICD-10-CM

## 2021-03-12 ENCOUNTER — Other Ambulatory Visit: Payer: Self-pay | Admitting: *Deleted

## 2021-03-12 MED ORDER — ISOSORBIDE MONONITRATE ER 30 MG PO TB24: 15.0000 mg | ORAL_TABLET | Freq: Every day | ORAL | 1 refills | Status: DC

## 2021-03-13 NOTE — Progress Notes (Signed)
Subjective:  Patient presents today status post fifth metatarsal head resection left foot. DOS: 12/28/2020.  Patient states that he is doing much better.  He no longer has the pain associated to the foot and he says the redness and swelling is decreased significantly.  No new complaints at this time  Past Medical History:  Diagnosis Date   Abnormal liver function tests    Allergic state    Anemia    Arthritis    spine, hands, shoulder with previous cuff tear   CAD (coronary artery disease)    a.  11/2015 CT Chest/Abd: Coronary Ca2+ noted;  b. MV: EF 45-54%, med defect of mod severity in basal infsept, basal inf, mid infsept, and mid inf region-> infarct and peri-infarct ischemia-->intermediate risk; c. 05/2016 Cath/PCI: LM 40ost, LAD 30p, 35m, D1 60ost, LCX 60ost, OM1 99 (small->Med Rx), RCA 60p, 95d (2.5x15 Resolute Onyx DES), RPDA/RPLB min irregs, D2 60, EF 55-65%/   Candidiasis of esophagus (HCC)    Colon polyp    DDD (degenerative disc disease), lumbosacral    Dysphagia    Elevated transaminase level    Esophageal stricture    a. s/p multiple dilations - last 03/2016.   Esophagitis, [place] grade D    Ganglion cyst of finger of right hand    GERD (gastroesophageal reflux disease)    Guaiac positive stools    Hiatal hernia    HNP (herniated nucleus pulposus), lumbar    Hypercholesterolemia    Hypertension    Lumbar radiculitis    Mallory-Weiss tear    a. 11/2015.   Migraine aura without headache    migraine, visual   Psoriasis    with psoriatic arthritis   Psoriatic arthritis ([place])    severe   Schatzki's ring    Sleep apnea    does not uses CPAP. has lost weight so is much better.   Type II diabetes mellitus ([place])      Past Surgical History:  Procedure Laterality Date   ARTHROPLASTY     right thumb; left thumb with 2 screws and plastic joint   BALLOON DILATION N/A 05/29/2015   Procedure: BALLOON DILATION;  Surgeon: Lollie Sails, MD;  Location: [facility]  ENDOSCOPY;  Service: Endoscopy;  Laterality: N/A;   BALLOON DILATION N/A 02/14/2017   Procedure: BALLOON DILATION;  Surgeon: Lollie Sails, MD;  Location: [facility] ENDOSCOPY;  Service: Endoscopy;  Laterality: N/A;   BILATERAL CARPAL TUNNEL RELEASE Bilateral 07/04/2017   Procedure: BILATERAL CARPAL TUNNEL RELEASE;  Surgeon: Leanor Kail, MD;  Location: [facility] ORS;  Service: Orthopedics;  Laterality: Bilateral;   BLEPHAROPLASTY     CARDIAC CATHETERIZATION     1 stent    CARPAL TUNNEL RELEASE  2005   right   CHOLECYSTECTOMY  2008   COLONOSCOPY     COLONOSCOPY WITH PROPOFOL N/A 02/13/2018   Procedure: COLONOSCOPY WITH PROPOFOL;  Surgeon: Lollie Sails, MD;  Location: [facility] ENDOSCOPY;  Service: Endoscopy;  Laterality: N/A;   CORONARY STENT INTERVENTION N/A 06/14/2016   Procedure: Coronary Stent Intervention;  Surgeon: Wellington Hampshire, MD;  Location: [place] CV LAB;  Service: Cardiovascular;  Laterality: N/A;   CYST EXCISION  04/14/2015   tendon sheath cyst excision; right ring finger cyst removed and trigger finger realease   ESOPHAGOGASTRODUODENOSCOPY     ESOPHAGOGASTRODUODENOSCOPY (EGD) WITH PROPOFOL N/A 05/29/2015   Procedure: ESOPHAGOGASTRODUODENOSCOPY (EGD) WITH PROPOFOL;  Surgeon: Lollie Sails, MD;  Location: [facility] ENDOSCOPY;  Service: Endoscopy;  Laterality: N/A;  ESOPHAGOGASTRODUODENOSCOPY (EGD) WITH PROPOFOL N/A 07/04/2015   Procedure: ESOPHAGOGASTRODUODENOSCOPY (EGD) WITH PROPOFOL;  Surgeon: Lollie Sails, MD;  Location: [facility] ENDOSCOPY;  Service: Endoscopy;  Laterality: N/A;   ESOPHAGOGASTRODUODENOSCOPY (EGD) WITH PROPOFOL N/A 01/04/2016   Procedure: ESOPHAGOGASTRODUODENOSCOPY (EGD) WITH PROPOFOL;  Surgeon: Lollie Sails, MD;  Location: [facility] ENDOSCOPY;  Service: Endoscopy;  Laterality: N/A;   ESOPHAGOGASTRODUODENOSCOPY (EGD) WITH PROPOFOL N/A 03/22/2016   Procedure: ESOPHAGOGASTRODUODENOSCOPY (EGD) WITH PROPOFOL;  Surgeon: Lollie Sails, MD;  Location: [facility]  ENDOSCOPY;  Service: Endoscopy;  Laterality: N/A;   ESOPHAGOGASTRODUODENOSCOPY (EGD) WITH PROPOFOL N/A 02/14/2017   Procedure: ESOPHAGOGASTRODUODENOSCOPY (EGD) WITH PROPOFOL;  Surgeon: Lollie Sails, MD;  Location: [facility] ENDOSCOPY;  Service: Endoscopy;  Laterality: N/A;   ESOPHAGOGASTRODUODENOSCOPY (EGD) WITH PROPOFOL N/A 04/11/2017   Procedure: ESOPHAGOGASTRODUODENOSCOPY (EGD) WITH PROPOFOL;  Surgeon: Lollie Sails, MD;  Location: [facility] ENDOSCOPY;  Service: Endoscopy;  Laterality: N/A;   ESOPHAGOGASTRODUODENOSCOPY (EGD) WITH PROPOFOL N/A 07/07/2017   Procedure: ESOPHAGOGASTRODUODENOSCOPY (EGD) WITH PROPOFOL;  Surgeon: Lucilla Lame, MD;  Location: [facility] ENDOSCOPY;  Service: Endoscopy;  Laterality: N/A;   ESOPHAGOGASTRODUODENOSCOPY (EGD) WITH PROPOFOL N/A 02/13/2018   Procedure: ESOPHAGOGASTRODUODENOSCOPY (EGD) WITH PROPOFOL;  Surgeon: Lollie Sails, MD;  Location: [facility] ENDOSCOPY;  Service: Endoscopy;  Laterality: N/A;   EYE SURGERY     FOREIGN BODY REMOVAL N/A 12/03/2015   Procedure: FOREIGN BODY REMOVAL;  Surgeon: Wilford Corner, MD;  Location: [facility] ENDOSCOPY;  Service: Endoscopy;  Laterality: N/A;   HERNIA REPAIR Right 1970   inguinal   INGUINAL HERNIA REPAIR Left 01/01/2018   Procedure: OPEN HERNIA REPAIR INGUINAL ADULT;  Surgeon: Olean Ree, MD;  Location: [facility] ORS;  Service: General;  Laterality: Left;   LEFT HEART CATH Left 06/14/2016   Procedure: Left Heart Cath;  Surgeon: Wellington Hampshire, MD;  Location: [place] CV LAB;  Service: Cardiovascular;  Laterality: Left;   LEFT HEART CATH AND CORONARY ANGIOGRAPHY Left 09/14/2018   Procedure: LEFT HEART CATH AND CORONARY ANGIOGRAPHY;  Surgeon: Wellington Hampshire, MD;  Location: [place] CV LAB;  Service: Cardiovascular;  Laterality: Left;   LUMBAR DISC SURGERY  2004   injections; herniated disc; percutaneous discectomy   NASAL SINUS SURGERY  2008   percutaneous lumbar discectomy     ROTATOR CUFF REPAIR Right     TONSILLECTOMY     Allergies  Allergen Reactions   Ephedrine Other (See Comments)    Causing prostate to hurt. Using for sinus bronchodilators Causing prostate to hurt. Using for sinus bronchodilators Pain in groin, prostate   Nsaids Other (See Comments)    GI upset, history of esophagitis and Mallory-Weis tear   Other Rash, Other (See Comments) and Nausea Only    Paper tape causes rash Plastic tape is okay GI upset, history of esophagitis and Mallory-Weis tear   Vancomycin Itching and Rash    Same as pcn with itching   Amoxicillin Itching and Rash   Hyoscyamine Sulfate Other (See Comments)    Urinary retention Urine retention   Levsin [Hyoscyamine Sulfate] Other (See Comments)    Urinary retention   Naproxen Other (See Comments)    GI upset Also happens with other NSAIDS  GI upset Also happens with other NSAIDS GI upset   Penicillins Itching and Rash    Has patient had a PCN reaction causing immediate rash, facial/tongue/throat swelling, SOB or lightheadedness with hypotension: yes Has patient had a PCN reaction causing severe rash involving mucus membranes or skin necrosis: no Has patient had a PCN reaction that  required hospitalization: no Has patient had a PCN reaction occurring within the last 10 years: no If all of the above answers are "NO", then may proceed with Cephalosporin use.    Shellfish Allergy Rash    Ingested shellfish cCuses rash and itching along with stomach sickness. Pt tolerates betadine    Sudafed [Pseudoephedrine Hcl] Other (See Comments)    Constricts prostate flow (from ephedrine d/t sinus meds)      Objective/Physical Exam Neurovascular status intact.  Skin incision healed.  No drainage noted.  No erythema or warmth.  No tenderness with palpation  Assessment: 1. s/p fifth metatarsal head resection left foot. DOS: 12/28/2020   Plan of Care:  1. Patient was evaluated.   2.  The incision site has now healed and there is no erythema or  clinical evidence of an infection.  Patient is doing well 3.  The patient may now slowly increase to full activity no restrictions 4 recommend good supportive shoes that are wide in the toebox and do not constrict the toes or irritate the surgical area 5.  Return to clinic as needed  Edrick Kins, DPM [facility]  Dr. Edrick Kins, DPM    [address],  [address]                Office [phone]  Fax [phone]

## 2021-03-26 ENCOUNTER — Other Ambulatory Visit: Payer: Self-pay | Admitting: Family Medicine

## 2021-03-26 DIAGNOSIS — M5412 Radiculopathy, cervical region: Secondary | ICD-10-CM

## 2021-03-26 DIAGNOSIS — G8929 Other chronic pain: Secondary | ICD-10-CM | POA: Diagnosis not present

## 2021-03-26 DIAGNOSIS — M5030 Other cervical disc degeneration, unspecified cervical region: Secondary | ICD-10-CM | POA: Diagnosis not present

## 2021-03-26 DIAGNOSIS — M25511 Pain in right shoulder: Secondary | ICD-10-CM | POA: Diagnosis not present

## 2021-03-26 DIAGNOSIS — M542 Cervicalgia: Secondary | ICD-10-CM | POA: Diagnosis not present

## 2021-03-27 ENCOUNTER — Ambulatory Visit
Admission: RE | Admit: 2021-03-27 | Discharge: 2021-03-27 | Disposition: A | Discharge status: Home / Self Care | Payer: Medicare Other | Source: Ambulatory Visit | Attending: Family Medicine | Admitting: Family Medicine

## 2021-03-27 DIAGNOSIS — M47812 Spondylosis without myelopathy or radiculopathy, cervical region: Secondary | ICD-10-CM | POA: Diagnosis not present

## 2021-03-27 DIAGNOSIS — M4802 Spinal stenosis, cervical region: Secondary | ICD-10-CM | POA: Diagnosis not present

## 2021-03-27 DIAGNOSIS — M5412 Radiculopathy, cervical region: Secondary | ICD-10-CM

## 2021-03-30 ENCOUNTER — Other Ambulatory Visit: Payer: Self-pay | Admitting: Neurosurgery

## 2021-03-30 DIAGNOSIS — M4802 Spinal stenosis, cervical region: Secondary | ICD-10-CM | POA: Diagnosis not present

## 2021-03-30 DIAGNOSIS — Z01818 Encounter for other preprocedural examination: Secondary | ICD-10-CM

## 2021-03-30 DIAGNOSIS — G959 Disease of spinal cord, unspecified: Secondary | ICD-10-CM | POA: Diagnosis not present

## 2021-04-06 ENCOUNTER — Other Ambulatory Visit: Payer: Self-pay

## 2021-04-06 ENCOUNTER — Telehealth: Payer: Self-pay | Admitting: *Deleted

## 2021-04-06 ENCOUNTER — Encounter
Admission: RE | Admit: 2021-04-06 | Discharge: 2021-04-06 | Disposition: A | Discharge status: Home / Self Care | Payer: Medicare Other | Source: Ambulatory Visit | Attending: Neurosurgery | Admitting: Neurosurgery

## 2021-04-06 ENCOUNTER — Encounter: Payer: Self-pay | Admitting: Neurosurgery

## 2021-04-06 VITALS — BP 125/86 | HR 69 | Ht 68.5 in | Wt 181.4 lb

## 2021-04-06 DIAGNOSIS — I25119 Atherosclerotic heart disease of native coronary artery with unspecified angina pectoris: Secondary | ICD-10-CM | POA: Diagnosis not present

## 2021-04-06 DIAGNOSIS — N183 Chronic kidney disease, stage 3 unspecified: Secondary | ICD-10-CM | POA: Diagnosis not present

## 2021-04-06 DIAGNOSIS — Z01818 Encounter for other preprocedural examination: Secondary | ICD-10-CM | POA: Insufficient documentation

## 2021-04-06 DIAGNOSIS — E1122 Type 2 diabetes mellitus with diabetic chronic kidney disease: Secondary | ICD-10-CM | POA: Diagnosis not present

## 2021-04-06 DIAGNOSIS — E0822 Diabetes mellitus due to underlying condition with diabetic chronic kidney disease: Secondary | ICD-10-CM

## 2021-04-06 HISTORY — DX: Other forms of angina pectoris: I20.89

## 2021-04-06 HISTORY — DX: Ventricular premature depolarization: I49.3

## 2021-04-06 HISTORY — DX: Fatty (change of) liver, not elsewhere classified: K76.0

## 2021-04-06 HISTORY — DX: Deficiency of other specified B group vitamins: E53.8

## 2021-04-06 HISTORY — DX: Elevated prostate specific antigen (PSA): R97.20

## 2021-04-06 HISTORY — DX: Other forms of angina pectoris: I20.8

## 2021-04-06 LAB — BASIC METABOLIC PANEL
Anion gap: 9 (ref 5–15)
BUN: 14 mg/dL (ref 8–23)
CO2: 24 mmol/L (ref 22–32)
Calcium: 9.1 mg/dL (ref 8.9–10.3)
Chloride: 105 mmol/L (ref 98–111)
Creatinine, Ser: 0.72 mg/dL (ref 0.61–1.24)
GFR, Estimated: >60 mL/min (ref >60)
Glucose, Bld: 158 mg/dL — ABNORMAL HIGH (ref 70–99)
Potassium: 3.7 mmol/L (ref 3.5–5.1)
Sodium: 138 mmol/L (ref 135–145)

## 2021-04-06 LAB — CBC
HCT: 35.4 % — ABNORMAL LOW (ref 39.0–52.0)
Hemoglobin: 11.3 g/dL — ABNORMAL LOW (ref 13.0–17.0)
MCH: 27.7 pg (ref 26.0–34.0)
MCHC: 31.9 g/dL (ref 30.0–36.0)
MCV: 86.8 fL (ref 80.0–100.0)
Platelets: 208 10*3/uL (ref 150–400)
RBC: 4.08 MIL/uL — ABNORMAL LOW (ref 4.22–5.81)
RDW: 17.2 % — ABNORMAL HIGH (ref 11.5–15.5)
WBC: 6.1 10*3/uL (ref 4.0–10.5)
nRBC: 0 % (ref 0.0–0.2)

## 2021-04-06 LAB — TYPE AND SCREEN
ABO/RH(D): A POS
Antibody Screen: NEGATIVE

## 2021-04-06 LAB — SURGICAL PCR SCREEN
MRSA, PCR: NEGATIVE
Staphylococcus aureus: NEGATIVE

## 2021-04-06 NOTE — Progress Notes (Signed)
?Perioperative Services ? ?Pre-Admission/Anesthesia Testing Clinical Review ? ?Date: 04/06/21 ? ?Patient Demographics:  ?Name: Shane Mcknight ?DOB:   1950/08/07 ?MRN:   [id number] ? ?Planned Surgical Procedure(s):  ? ? Case: [id number] Date/Time: 04/13/21 0700  ? Procedure: C3-5 ANTERIOR CERVICAL DISCECTOMY AND FUSION (GLOBUS ALLOGRAFT)  ? Anesthesia type: General  ? Pre-op diagnosis:  ?    Cervical myelopathy G95.9 ?    Degenerative cervical spinal stenosis M48.02  ? Location: [facility] OR ROOM 03 / [facility] ORS FOR ANESTHESIA GROUP  ? Surgeons: Meade Maw, MD  ? ?NOTE: Available PAT nursing documentation and vital signs have been reviewed. Clinical nursing staff has updated patient's PMH/PSHx, current medication list, and drug allergies/intolerances to ensure comprehensive history available to assist in medical decision making as it pertains to the aforementioned surgical procedure and anticipated anesthetic course. Extensive review of available clinical information performed.  PMH and PSHx updated with any diagnoses/procedures that  may have been inadvertently omitted during his intake with the pre-admission testing department's nursing staff. ? ?Clinical Discussion:  ?Shane Mcknight is a 71 y.o. male who is submitted for pre-surgical anesthesia review and clearance prior to him undergoing the above procedure. Patient is a Former Smoker (1.75 pack years; quit 06/1982). Pertinent PMH includes: CAD, effort angina, aortic atherosclerosis, BILATERAL carotid artery stenosis, PVCs, HTN, HLD, T2DM, OSAH (does not require nocturnal PAP therapy), GERD (on daily PPI), hiatal hernia, dysphagia secondary to esophageal stricture (s/p multiple dilations), anemia, Mallory-Weiss tear, lumbosacral DDD with radiculopathy, OA, severe psoriatic arthritis ( on immunosuppressives). ? ?Patient is followed by cardiology Fletcher Anon, MD). He was last seen in the cardiology clinic on 10/24/2020; notes reviewed.  At the time of his last clinic  visit, patient reporting minimal chest discomfort with no associated shortness of breath.  He denied PND, orthopnea, palpitations, significant peripheral edema, vertiginous symptoms, or presyncope/syncope.  Patient with a past medical history significant for cardiovascular diagnoses. ? ?Diagnostic left heart catheterization performed on 06/14/2016 revealing multivessel CAD; 60% proximal RCA, 60% RPLB-2, 99% OM1, 50% mid LAD, 40% ostial LM-LM, 30% proximal LAD, 60% ostial D1-D1, 60% ostial LCx, and 95% distal RCA.  PCI subsequently performed placing a 2.5 x 15 mm Resolute Onyx DES to the distal RCA resulting in excellent angiographic result (TIMI-3 flow). ? ?Diagnostic left heart catheterization performed on 09/14/2018 revealing multivessel CAD; 40% ostial LM-LM, 50% mid LAD, 30% proximal LAD, 60% ostial D1-D1, 40% ostial LCx, 100% OM1, 60% proximal to mid RCA, and 50% RPAV.  Previously placed stent to the distal RCA was noted to be widely patent.  Further intervention deferred opting for medical management. ? ?Last myocardial perfusion imaging study was performed on 03/31/2020 revealing a normal left ventricular systolic function with an EF of 55-65%.  CT attenuation images show evidence of aortic and coronary calcification.  There was no evidence of stress-induced myocardial ischemia or arrhythmia.  Study determined to be normal and low risk. ? ?Last TTE was performed on 04/06/2020 revealing normal left ventricular systolic function with an EF of 55-60%.  Diastolic Doppler parameters consistent with pseudonormalization (G2DD).  Left atrium was mildly dilated.  No significant valvular regurgitation noted.  There was no evidence of a significant transvalvular gradient to suggest valvular stenosis. ? ?BILATERAL carotid Doppler study performed on 04/17/2020 revealed 40-59% stenosis in the RICA/LICA.  ? ?Blood pressure reasonably controlled at 138/60 on currently prescribed beta-blocker, nitrate, and ARB therapies.   Patient is on a statin for his HLD. T2DM reasonably controlled on currently prescribed  regimen; last HgbA1c was 7.2% when checked on 11/07/2020. Patient has an OSA diagnosis, however he does not require the use of nocturnal PAP therapy.  Functional capacity, as defined by DASI, is documented as being >/= 4 METS. No changes were made to his medication regimen.  Patient to follow-up with outpatient cardiology in 12 months or sooner if needed. ? ?Shane Mcknight is scheduled for an C3-5 ANTERIOR CERVICAL DISCECTOMY AND FUSION (GLOBUS ALLOGRAFT) on 04/13/2021 with Dr. Meade Maw, MD.  Given patient's past medical history significant for cardiovascular diagnoses, presurgical cardiac clearance was sought by the PAT team. Per cardiology, "RCRI is calculated at 0.9% indicating low CV risk for surgery. The patient affirms he has been doing well without any new cardiac symptoms. He is able to well exceed over 4 METS particularly with yardwork (i.e. push mowing) without any angina or dyspnea. Therefore, based on ACC/AHA guidelines, the patient would be at an ACCEPTABLE risk for the planned procedure without further cardiovascular testing". In review of his medication reconciliation, it is noted the patient is on daily antiplatelet therapy. Given patient's past cardiovascular history, primary attending surgeon has advised that patient may continue his daily low-dose ASA throughout his perioperative course. ? ?Patient denies previous perioperative complications with anesthesia in the past. In review of the available records, it is noted that patient underwent a TIVA/general anesthetic course at [facility] (ASA III) in 02/2020 without documented complications.  ? ? ?  04/06/2021  ? 11:42 AM 02/15/2021  ? 12:32 PM 10/24/2020  ?  4:34 PM  ?Vitals with BMI  ?Height 5' 8.5" 5' 8.5" 5' 8.5"  ?Weight 181 lbs 7 oz 178 lbs 185 lbs 2 oz  ?BMI 27.18 26.67 27.74  ?Systolic 553  748  ?Diastolic 86  60  ?Pulse 69  59   ? ? ?Providers/Specialists:  ? ?NOTE: Primary physician provider listed below. Patient may have been seen by APP or partner within same practice.  ? ?PROVIDER ROLE / SPECIALTY LAST OV  ?Meade Maw, MD Neurosurgery (Surgeon) 03/30/2021  ?Einar Pheasant, MD Primary Care Provider 05/16/2020  ?Kathlyn Sacramento, MD Cardiology 10/24/2020  ?Sharlet Salina, MD Physiatry 03/26/2021  ?Dorthula Matas, MD Rheumatology 11/28/2020  ? ?Allergies:  ?Ephedrine, Nsaids, Other, Vancomycin, Amoxicillin, Hyoscyamine sulfate, Levsin [hyoscyamine sulfate], Naproxen, Penicillins, Shellfish allergy, and Sudafed [pseudoephedrine hcl] ? ?Current Home Medications:  ? ?No current facility-administered medications for this encounter.  ? ? acetaminophen (TYLENOL) 500 MG tablet  ? aspirin EC 81 MG tablet  ? atenolol (TENORMIN) 50 MG tablet  ? cyanocobalamin (,VITAMIN B-12,) 1000 MCG/ML injection  ? folic acid (FOLVITE) 1 MG tablet  ? gabapentin (NEURONTIN) 300 MG capsule  ? isosorbide mononitrate (IMDUR) 30 MG 24 hr tablet  ? losartan (COZAAR) 25 MG tablet  ? metFORMIN (GLUCOPHAGE) 500 MG tablet  ? methotrexate (RHEUMATREX) 2.5 MG tablet  ? nitroGLYCERIN (NITROSTAT) 0.4 MG SL tablet  ? pantoprazole (PROTONIX) 40 MG tablet  ? polyvinyl alcohol (LIQUIFILM TEARS) 1.4 % ophthalmic solution  ? rosuvastatin (CRESTOR) 10 MG tablet  ? sodium chloride (OCEAN) 0.65 % SOLN nasal spray  ? traMADol (ULTRAM) 50 MG tablet  ? vitamin E 400 UNIT capsule  ? COSENTYX SENSOREADY PEN 150 MG/ML SOAJ  ? melatonin 5 MG TABS  ? oxyCODONE-acetaminophen (PERCOCET) 5-325 MG tablet  ? PROPYLENE GLYCOL-GLYCERIN OP  ? sulfamethoxazole-trimethoprim (BACTRIM DS) 800-160 MG tablet  ? SYRINGE-NEEDLE, DISP, 3 ML 25G X 5/8" 3 ML MISC  ? ?History:  ? ?Past Medical History:  ?Diagnosis  Date  ? Allergic state   ? Anemia   ? Aortic atherosclerosis ([place])   ? Arthritis   ? spine, hands, shoulder with previous cuff tear  ? B12 deficiency   ? CAD (coronary artery  disease)   ? a.) 11/2015 CT: Coronary Ca2+ noted. b.) LHC 06/14/2016: 60% pRCA, 60% RPLB-2, 99% OM1, 50% mLAD, 40% oLM-LM, 30% pLAD, 60% oD1-D1, 60% oLCx, 95% dRCA; PCI placing a 2.5 x 17m Resolute Onyx DES dRCA. c.) LHC 09/14/18

## 2021-04-06 NOTE — Telephone Encounter (Signed)
PT AGREEABLE TO PLAN OF CARE FOR TELE PRE OP APPT 04/09/21, 10 AM . MED REC AND CONSENT ARE DONE.  ? ?  ?Patient Consent for Virtual Visit  ? ? ?   ? ?[name] has provided verbal consent on 04/06/2021 for a virtual visit (video or telephone). ? ? ?CONSENT FOR VIRTUAL VISIT FOR:  Shane Mcknight  ?By participating in this virtual visit I agree to the following: ? ?I hereby voluntarily request, consent and authorize [facility] and its employed or contracted physicians, physician assistants, nurse practitioners or other licensed health care professionals (the Practitioner), to provide me with telemedicine health care services (the ?Services") as deemed necessary by the treating Practitioner. I acknowledge and consent to receive the Services by the Practitioner via telemedicine. I understand that the telemedicine visit will involve communicating with the Practitioner through live audiovisual communication technology and the disclosure of certain medical information by electronic transmission. I acknowledge that I have been given the opportunity to request an in-person assessment or other available alternative prior to the telemedicine visit and am voluntarily participating in the telemedicine visit. ? ?I understand that I have the right to withhold or withdraw my consent to the use of telemedicine in the course of my care at any time, without affecting my right to future care or treatment, and that the Practitioner or I may terminate the telemedicine visit at any time. I understand that I have the right to inspect all information obtained and/or recorded in the course of the telemedicine visit and may receive copies of available information for a reasonable fee.  I understand that some of the potential risks of receiving the Services via telemedicine include:  ?Delay or interruption in medical evaluation due to technological equipment failure or disruption; ?Information transmitted may not be sufficient (e.g. poor  resolution of images) to allow for appropriate medical decision making by the Practitioner; and/or  ?In rare instances, security protocols could fail, causing a breach of personal health information. ? ?Furthermore, I acknowledge that it is my responsibility to provide information about my medical history, conditions and care that is complete and accurate to the best of my ability. I acknowledge that Practitioner's advice, recommendations, and/or decision may be based on factors not within their control, such as incomplete or inaccurate data provided by me or distortions of diagnostic images or specimens that may result from electronic transmissions. I understand that the practice of medicine is not an exact science and that Practitioner makes no warranties or guarantees regarding treatment outcomes. I acknowledge that a copy of this consent can be made available to me via my patient portal ([place]), or I can request a printed copy by calling the office of [facility].   ? ?I understand that my insurance will be billed for this visit.  ? ?I have read or had this consent read to me. ?I understand the contents of this consent, which adequately explains the benefits and risks of the Services being provided via telemedicine.  ?I have been provided ample opportunity to ask questions regarding this consent and the Services and have had my questions answered to my satisfaction. ?I give my informed consent for the services to be provided through the use of telemedicine in my medical care ? ? ? ?

## 2021-04-06 NOTE — Telephone Encounter (Signed)
Preoperative team, please contact this patient and set up a phone call appointment for further cardiac evaluation.  Thank you for your help. ? ?Jossie Ng. Taiki Buckwalter NP-C ? ?  ?04/06/2021, 1:39 PM ?[place] ?[address] ?Office [phone] Fax [phone] ? ?

## 2021-04-06 NOTE — Telephone Encounter (Signed)
PT AGREEABLE TO PLAN OF CARE FOR TELE PRE OP APPT 04/09/21, 10 AM . MED REC AND CONSENT ARE DONE.  ?

## 2021-04-06 NOTE — Telephone Encounter (Signed)
-----   Message from Karen Kitchens, NP sent at 04/06/2021 12:55 PM EDT ----- ?Regarding: Request for pre-operative cardiac clearance ?Request for pre-operative cardiac clearance: ?? ?1. What type of surgery is being performed?  ?C3-5 ANTERIOR CERVICAL DISCECTOMY AND FUSION (GLOBUS ALLOGRAFT ? ?2. When is this surgery scheduled?  ?04/13/2021 ?? ?3. Type of clearance being requested (medical, pharmacy, both). ?MEDICAL ?  ?4. Are there any medications that need to be held prior to surgery? ?NONE. On low dose ASA, but may continue per surgeon.  ? ?5. Practice name and name of physician performing surgery?  ?Performing surgeon: Dr. Meade Maw, MD ?Requesting clearance: Honor Loh, FNP-C   ?? ?6. Anesthesia type (none, local, MAC, general)? ?GENERAL ? ?7. What is the office phone and fax number?   ?Phone: [phone] ?Fax: [phone] ? ?ATTENTION: Unable to create telephone message as per your standard workflow. Directed by [facility] providers to send requests for cardiac clearance to this pool for appropriate distribution to provider covering pre-operative clearances.  ? ?Honor Loh, MSN, APRN, FNP-C, CEN ?[place]  ?Peri-operative Services Nurse Practitioner ?Phone: [phone] ?04/06/21 12:55 PM ? ?

## 2021-04-06 NOTE — Telephone Encounter (Signed)
Request for pre-operative cardiac clearance ?Received: Today ?Karen Kitchens, NP  P Cv Div Preop Callback ?Request for pre-operative cardiac clearance:  ?   ?1. What type of surgery is being performed?  ?C3-5 ANTERIOR CERVICAL DISCECTOMY AND FUSION (GLOBUS ALLOGRAFT  ? ?2. When is this surgery scheduled?  ?04/13/2021  ?   ?3. Type of clearance being requested (medical, pharmacy, both).  ?MEDICAL  ?   ?4. Are there any medications that need to be held prior to surgery?  ?NONE. On low dose ASA, but may continue per surgeon.  ? ?5. Practice name and name of physician performing surgery?  ?Performing surgeon: Dr. Meade Maw, MD  ?Requesting clearance: Honor Loh, FNP-C    ?   ?6. Anesthesia type (none, local, MAC, general)?  ?GENERAL  ? ?7. What is the office phone and fax number?    ?Phone: [phone]  ?Fax: [phone]  ? ?ATTENTION: Unable to create telephone message as per your standard workflow. Directed by [facility] providers to send requests for cardiac clearance to this pool for appropriate distribution to provider covering pre-operative clearances.  ? ?Honor Loh, MSN, APRN, FNP-C, CEN  ?[place]  ?Peri-operative Services Nurse Practitioner  ?Phone: [phone]  ?04/06/21 12:55 PM   ?

## 2021-04-06 NOTE — Patient Instructions (Addendum)
Your procedure is scheduled on:04-13-21 Friday ?Report to the Registration Desk on the 1st floor of the [facility].Then proceed to the 2nd floor Surgery Desk in the [facility] ?To find out your arrival time, please call [phone] between 1PM - 3PM on:04-12-21 Thursday ? ?REMEMBER: ?Instructions that are not followed completely may result in serious medical risk, up to and including death; or upon the discretion of your surgeon and anesthesiologist your surgery may need to be rescheduled. ? ?Do not eat food after midnight the night before surgery.  ?No gum chewing, lozengers or hard candies. ? ?You may however, drink Water up to 2 hours before you are scheduled to arrive for your surgery. Do not drink anything within 2 hours of your scheduled arrival time. ? ?Type 1 and Type 2 diabetics should only drink water. ? ?TAKE THESE MEDICATIONS THE MORNING OF SURGERY WITH A SIP OF WATER: ?-atenolol (TENORMIN)  ?-isosorbide mononitrate (IMDUR) ?-pantoprazole (PROTONIX)  ? ?Stop your metFORMIN (GLUCOPHAGE) 2 days prior to surgery-Last dose on 04-10-21 Tuesday ? ?Continue your 81 mg Aspirin up until the day prior to surgery as instructed by Dr Izora Ribas. Do NOT take the day of surgery ? ?Stop your  COSENTYX Injection Now-You may continue this 2 weeks after surgery as instructed by Dr. Izora Ribas ? ?One week prior to surgery: ?Stop Anti-inflammatories (NSAIDS) such as Advil, Aleve, Ibuprofen, Motrin, Naproxen, Naprosyn and Aspirin based products such as Excedrin, Goodys Powder, BC Powder.You may however, continue to take Tylenol/Tramadol if needed for pain up until the day of surgery. ? ?Stop ANY OVER THE COUNTER supplements/vitamins NOW (04-06-21) until after surgery (folic acid , vitamin E)-May continue Melatonin up until the night prior to surgery ? ?No Alcohol for 24 hours before or after surgery. ? ?No Smoking including e-cigarettes for 24 hours prior to surgery.  ?No chewable tobacco products for at least 6 hours prior  to surgery.  ?No nicotine patches on the day of surgery. ? ?Do not use any "recreational" drugs for at least a week prior to your surgery.  ?Please be advised that the combination of cocaine and anesthesia may have negative outcomes, up to and including death. ?If you test positive for cocaine, your surgery will be cancelled. ? ?On the morning of surgery brush your teeth with toothpaste and water, you may rinse your mouth with mouthwash if you wish. ?Do not swallow any toothpaste or mouthwash. ? ?Use CHG Soap as directed on instruction sheet. ? ?Do not wear jewelry, make-up, hairpins, clips or nail polish. ? ?Do not wear lotions, powders, or perfumes.  ? ?Do not shave body from the neck down 48 hours prior to surgery just in case you cut yourself which could leave a site for infection.  ?Also, freshly shaved skin may become irritated if using the CHG soap. ? ?Contact lenses, hearing aids and dentures may not be worn into surgery. ? ?Do not bring valuables to the hospital. [facility] is not responsible for any missing/lost belongings or valuables.  ? ?Notify your doctor if there is any change in your medical condition (cold, fever, infection). ? ?Wear comfortable clothing (specific to your surgery type) to the hospital. ? ?After surgery, you can help prevent lung complications by doing breathing exercises.  ?Take deep breaths and cough every 1-2 hours. Your doctor may order a device called an Incentive Spirometer to help you take deep breaths. ?When coughing or sneezing, hold a pillow firmly against your incision with both hands. This is called ?splinting.? Doing  this helps protect your incision. It also decreases belly discomfort. ? ?If you are being admitted to the hospital overnight, leave your suitcase in the car. ?After surgery it may be brought to your room. ? ?If you are being discharged the day of surgery, you will not be allowed to drive home. ?You will need a responsible adult (18 years or older) to drive  you home and stay with you that night.  ? ?If you are taking public transportation, you will need to have a responsible adult (18 years or older) with you. ?Please confirm with your physician that it is acceptable to use public transportation.  ? ?Please call the [facility] Dept. at [phone] if you have any questions about these instructions. ? ?Surgery Visitation Policy: ? ?Patients undergoing a surgery or procedure may have two family members or support persons with them as long as the person is not COVID-19 positive or experiencing its symptoms.  ? ?Inpatient Visitation:   ? ?Visiting hours are 7 a.m. to 8 p.m. ?Up to four visitors are allowed at one time in a patient room, including children. The visitors may rotate out with other people during the day. One designated support person (adult) may remain overnight. ? ?All Areas: ?All visitors must pass COVID-19 screenings, use hand sanitizer when entering and exiting the patient?s room and wear a mask at all times, including in the patient?s room. ?Patients must also wear a mask when staff or their visitor are in the room. ?Masking is required regardless of vaccination status.  ?

## 2021-04-09 ENCOUNTER — Other Ambulatory Visit: Payer: Self-pay

## 2021-04-09 ENCOUNTER — Ambulatory Visit (INDEPENDENT_AMBULATORY_CARE_PROVIDER_SITE_OTHER): Payer: Medicare Other | Admitting: Physician Assistant

## 2021-04-09 DIAGNOSIS — Z01810 Encounter for preprocedural cardiovascular examination: Secondary | ICD-10-CM | POA: Diagnosis not present

## 2021-04-09 NOTE — Progress Notes (Addendum)
? ?Virtual Visit via Telephone Note  ? ?This visit type was conducted due to national recommendations for restrictions regarding the COVID-19 Pandemic (e.g. social distancing) in an effort to limit this patient's exposure and mitigate transmission in our community.  Due to his co-morbid illnesses, this patient is at least at moderate risk for complications without adequate follow up.  This format is felt to be most appropriate for this patient at this time.  The patient did not have access to video technology/had technical difficulties with video requiring transitioning to audio format only (telephone).  All issues noted in this document were discussed and addressed.  No physical exam could be performed with this format.  Please refer to the patient's chart for his  consent to telehealth for [facility]. ?Evaluation Performed:  Preoperative cardiovascular risk assessment ? ?This visit type was conducted due to national recommendations for restrictions regarding the COVID-19 Pandemic (e.g. social distancing).  This format is felt to be most appropriate for this patient at this time.  All issues noted in this document were discussed and addressed.  No physical exam was performed (except for noted visual exam findings with Video Visits).  Please refer to the patient's chart (MyChart message for video visits and phone note for telephone visits) for the patient's consent to telehealth for [facility]. ?_____________  ? ?Date:  04/09/2021  ? ?Patient ID:  Shane Mcknight, DOB 29-Mar-1950, MRN [id number] ?Patient Location:  ?Work ?Provider location:   ?Office ? ?Primary Care Provider:  Einar Pheasant, MD ?Primary Cardiologist:  Kathlyn Sacramento, MD ? ?Chief Complaint  ?  ?71 y.o. y/o male with a h/o CAD, HTN, HLD, DM, GERD, Mallory-Weiss tear, anemia, arthritis, hiatal hernia, psoriasis, DDD, esophageal strictures, sleep apnea, moderate carotid artery disease who is pending C3-5 anterior cervical discectomy and fusion, and  presents today for telephonic preoperative cardiovascular risk assessment. ? ?Past Medical History  ?  ?Past Medical History:  ?Diagnosis Date  ? Allergic state   ? Anemia   ? Aortic atherosclerosis ([place])   ? Arthritis   ? spine, hands, shoulder with previous cuff tear  ? B12 deficiency   ? CAD (coronary artery disease)   ? a.) 11/2015 CT: Coronary Ca2+ noted. b.) LHC 06/14/2016: 60% pRCA, 60% RPLB-2, 99% OM1, 50% mLAD, 40% oLM-LM, 30% pLAD, 60% oD1-D1, 60% oLCx, 95% dRCA; PCI placing a 2.5 x 61m Resolute Onyx DES dRCA. c.) LHC 09/14/2018: 40% oLM-LM, 50% mLAD, 30% pLAD, 60% oD1-D1, 40% oLCx, 100% OM1, 60% p-mRCA, 50% RPAV; dRCA stent widely patent; medical mgmt.  ? Candidiasis of esophagus ([place]   ? Carotid artery stenosis 04/17/2020  ? a.) Carotid doppler 042/35/3614 443-15%RICA/LICA stenosis.  ? Colon polyp   ? DDD (degenerative disc disease), lumbosacral   ? Diastolic dysfunction 040/08/6759 ? a.)  TTE 04/06/2020; EF 55-60%; mild LA dilation; no RWMAs; G2DD.  ? Dysphagia   ? Effort angina (HCC)   ? Elevated PSA   ? Elevated transaminase level   ? Esophageal stricture   ? a.) s/p multiple dilations  ? Esophagitis, [place] grade D   ? Fatty liver   ? Ganglion cyst of finger of right hand   ? GERD (gastroesophageal reflux disease)   ? Guaiac positive stools   ? Hiatal hernia   ? HNP (herniated nucleus pulposus), lumbar   ? Hypercholesterolemia   ? Hypertension   ? Long term current use of immunosuppressive drug   ? a.) MTX + secukinumab for severe psoriatic  arthritis  ? Lumbar radiculitis   ? 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 Migraine aura without headache   ? OSA (obstructive sleep apnea)   ? a.) does not use nocturnal PAP therapy  ? Psoriasis   ? Psoriatic arthritis ([place])   ? a.) severe; Tx'd with MTX + secukinumab  ? PVC (premature ventricular contraction)   ? Schatzki's ring   ? Type II diabetes mellitus ([place])   ? ?Past Surgical History:  ?Procedure Laterality Date  ? ARTHROPLASTY    ? right thumb; left thumb  with 2 screws and plastic joint  ? BALLOON DILATION N/A 05/29/2015  ? Procedure: BALLOON DILATION;  Surgeon: Lollie Sails, MD;  Location: [facility] ENDOSCOPY;  Service: Endoscopy;  Laterality: N/A;  ? BALLOON DILATION N/A 02/14/2017  ? Procedure: BALLOON DILATION;  Surgeon: Lollie Sails, MD;  Location: [facility] ENDOSCOPY;  Service: Endoscopy;  Laterality: N/A;  ? BILATERAL CARPAL TUNNEL RELEASE Bilateral 07/04/2017  ? Procedure: BILATERAL CARPAL TUNNEL RELEASE;  Surgeon: Leanor Kail, MD;  Location: [facility] ORS;  Service: Orthopedics;  Laterality: Bilateral;  ? BLEPHAROPLASTY    ? BUNIONECTOMY Left 12/2020  ? CARDIAC CATHETERIZATION    ? 1 stent   ? CARPAL TUNNEL RELEASE  2005  ? right  ? CHOLECYSTECTOMY  2008  ? COLONOSCOPY    ? COLONOSCOPY WITH PROPOFOL N/A 02/13/2018  ? Procedure: COLONOSCOPY WITH PROPOFOL;  Surgeon: Lollie Sails, MD;  Location: [facility] ENDOSCOPY;  Service: Endoscopy;  Laterality: N/A;  ? CORONARY STENT INTERVENTION N/A 06/14/2016  ? Procedure: Coronary Stent Intervention;  Surgeon: Wellington Hampshire, MD;  Location: [facility] CV LAB;  Service: Cardiovascular;  Laterality: N/A;  ? CYST EXCISION  04/14/2015  ? tendon sheath cyst excision; right ring finger cyst removed and trigger finger realease  ? ESOPHAGOGASTRODUODENOSCOPY    ? ESOPHAGOGASTRODUODENOSCOPY (EGD) WITH PROPOFOL N/A 05/29/2015  ? Procedure: ESOPHAGOGASTRODUODENOSCOPY (EGD) WITH PROPOFOL;  Surgeon: Lollie Sails, MD;  Location: [facility] ENDOSCOPY;  Service: Endoscopy;  Laterality: N/A;  ? ESOPHAGOGASTRODUODENOSCOPY (EGD) WITH PROPOFOL N/A 07/04/2015  ? Procedure: ESOPHAGOGASTRODUODENOSCOPY (EGD) WITH PROPOFOL;  Surgeon: Lollie Sails, MD;  Location: [facility] ENDOSCOPY;  Service: Endoscopy;  Laterality: N/A;  ? ESOPHAGOGASTRODUODENOSCOPY (EGD) WITH PROPOFOL N/A 01/04/2016  ? Procedure: ESOPHAGOGASTRODUODENOSCOPY (EGD) WITH PROPOFOL;  Surgeon: Lollie Sails, MD;  Location: [facility] ENDOSCOPY;  Service: Endoscopy;  Laterality:  N/A;  ? ESOPHAGOGASTRODUODENOSCOPY (EGD) WITH PROPOFOL N/A 03/22/2016  ? Procedure: ESOPHAGOGASTRODUODENOSCOPY (EGD) WITH PROPOFOL;  Surgeon: Lollie Sails, MD;  Location: [facility] ENDOSCOPY;  Service: Endoscopy;  Laterality: N/A;  ? ESOPHAGOGASTRODUODENOSCOPY (EGD) WITH PROPOFOL N/A 02/14/2017  ? Procedure: ESOPHAGOGASTRODUODENOSCOPY (EGD) WITH PROPOFOL;  Surgeon: Lollie Sails, MD;  Location: [facility] ENDOSCOPY;  Service: Endoscopy;  Laterality: N/A;  ? ESOPHAGOGASTRODUODENOSCOPY (EGD) WITH PROPOFOL N/A 04/11/2017  ? Procedure: ESOPHAGOGASTRODUODENOSCOPY (EGD) WITH PROPOFOL;  Surgeon: Lollie Sails, MD;  Location: [facility] ENDOSCOPY;  Service: Endoscopy;  Laterality: N/A;  ? ESOPHAGOGASTRODUODENOSCOPY (EGD) WITH PROPOFOL N/A 07/07/2017  ? Procedure: ESOPHAGOGASTRODUODENOSCOPY (EGD) WITH PROPOFOL;  Surgeon: Lucilla Lame, MD;  Location: [facility] ENDOSCOPY;  Service: Endoscopy;  Laterality: N/A;  ? ESOPHAGOGASTRODUODENOSCOPY (EGD) WITH PROPOFOL N/A 02/13/2018  ? Procedure: ESOPHAGOGASTRODUODENOSCOPY (EGD) WITH PROPOFOL;  Surgeon: Lollie Sails, MD;  Location: [facility] ENDOSCOPY;  Service: Endoscopy;  Laterality: N/A;  ? FOREIGN BODY REMOVAL N/A 12/03/2015  ? Procedure: FOREIGN BODY REMOVAL;  Surgeon: Wilford Corner, MD;  Location: [facility] ENDOSCOPY;  Service: Endoscopy;  Laterality: N/A;  ? HERNIA REPAIR Right 1970  ? inguinal  ? INGUINAL HERNIA  REPAIR Left 01/01/2018  ? Procedure: OPEN HERNIA REPAIR INGUINAL ADULT;  Surgeon: Olean Ree, MD;  Location: [facility] ORS;  Service: General;  Laterality: Left;  ? LEFT HEART CATH Left 06/14/2016  ? Procedure: Left Heart Cath;  Surgeon: Wellington Hampshire, MD;  Location: [facility] CV LAB;  Service: Cardiovascular;  Laterality: Left;  ? LEFT HEART CATH AND CORONARY ANGIOGRAPHY Left 09/14/2018  ? Procedure: LEFT HEART CATH AND CORONARY ANGIOGRAPHY;  Surgeon: Wellington Hampshire, MD;  Location: [facility] CV LAB;  Service: Cardiovascular;  Laterality: Left;  ? [place]  SURGERY  2004  ? injections; herniated disc; percutaneous discectomy  ? NASAL SINUS SURGERY  2008  ? ROTATOR CUFF REPAIR Right   ? TONSILLECTOMY    ? ? ?Allergies ? ?Allergies  ?Allergen Reactions  ? Ephe

## 2021-04-09 NOTE — Addendum Note (Signed)
Addended by: Charlie Pitter on: 04/09/2021 10:13 AM ? ? Modules accepted: Orders ? ?

## 2021-04-09 NOTE — Addendum Note (Signed)
Addended by: Charlie Pitter on: 04/09/2021 10:14 AM ? ? Modules accepted: Orders ? ?

## 2021-04-12 MED ORDER — CHLORHEXIDINE GLUCONATE 0.12 % MT SOLN: 15.0000 mL | Freq: Once | OROMUCOSAL | Status: AC

## 2021-04-12 MED ORDER — CEFAZOLIN SODIUM-DEXTROSE 2-4 GM/100ML-% IV SOLN
2.0000 g | INTRAVENOUS | Status: AC
  Administered 2021-04-13: 2 g via INTRAVENOUS

## 2021-04-12 MED ORDER — SODIUM CHLORIDE 0.9 % IV SOLN: INTRAVENOUS | Status: DC

## 2021-04-12 MED ORDER — ORAL CARE MOUTH RINSE: 15.0000 mL | Freq: Once | OROMUCOSAL | Status: AC

## 2021-04-12 NOTE — Progress Notes (Signed)
Pharmacy Antibiotic Note ? ?Shane Mcknight is a 71 y.o. male admitted on (Not on file) with surgical prophylaxis.  Pharmacy has been consulted for Cefazolin dosing. ? ?Plan: ?Cefazolin 2 gm IV X 1 60 min pre-op ordered for 3/31 @ 0500.  ? ?  ? ?No data recorded. ? ?Recent Labs  ?Lab 04/06/21 ?1225  ?WBC 6.1  ?CREATININE 0.72  ?  ?Estimated Creatinine Clearance: 82.2 mL/min (by C-G formula based on SCr of 0.72 mg/dL).   ? ?Allergies  ?Allergen Reactions  ? Ephedrine Other (See Comments)  ?  Causing prostate to hurt. Using for sinus bronchodilators ?Pain in groin, prostate  ? Nsaids Other (See Comments)  ?  GI upset, history of esophagitis and Mallory-Weis tear  ? Other Rash, Other (See Comments) and Nausea Only  ?  Paper tape causes rash ?Plastic tape is okay ?GI upset, history of esophagitis and Mallory-Weis tear  ? Vancomycin Itching and Rash  ?  Same as pcn with itching  ? Amoxicillin Itching and Rash  ? Hyoscyamine Sulfate Other (See Comments)  ?  Urinary retention ?Urine retention  ? Levsin [Hyoscyamine Sulfate] Other (See Comments)  ?  Urinary retention  ? Naproxen Other (See Comments)  ?  GI upset ?Also happens with other NSAIDS  ? Penicillins Itching and Rash  ?  Has patient had a PCN reaction causing immediate rash, facial/tongue/throat swelling, SOB or lightheadedness with hypotension: yes ?Has patient had a PCN reaction causing severe rash involving mucus membranes or skin necrosis: no ?Has patient had a PCN reaction that required hospitalization: no ?Has patient had a PCN reaction occurring within the last 10 years: no ?If all of the above answers are "NO", then may proceed with Cephalosporin use. ?  ? Shellfish Allergy Itching, Nausea Only and Rash  ?  Ingested shellfish cCuses rash and itching along with stomach sickness. ?Pt tolerates betadine   ? Sudafed [Pseudoephedrine Hcl] Other (See Comments)  ?  Constricts prostate flow (from ephedrine d/t sinus meds)  ? ? ?Antimicrobials this admission: ?  >>  ?   >>  ? ?Dose adjustments this admission: ? ? ?Microbiology results: ? BCx:  ? UCx:   ? Sputum:   ? MRSA PCR:  ? ?Thank you for allowing pharmacy to be a part of this patient?s care. ? ?Yolinda Duerr D ?04/12/2021 10:20 PM ? ?

## 2021-04-13 ENCOUNTER — Ambulatory Visit: Payer: Medicare Other

## 2021-04-13 ENCOUNTER — Other Ambulatory Visit: Payer: Self-pay

## 2021-04-13 ENCOUNTER — Observation Stay
Admission: RE | Admit: 2021-04-13 | Discharge: 2021-04-13 | Disposition: A | Discharge status: Home / Self Care | Payer: Medicare Other | Source: Ambulatory Visit | Attending: Neurosurgery | Admitting: Neurosurgery

## 2021-04-13 ENCOUNTER — Encounter: Payer: Self-pay | Admitting: Neurosurgery

## 2021-04-13 ENCOUNTER — Encounter
Admission: RE | Disposition: A | Discharge status: Home / Self Care | Payer: Self-pay | Source: Ambulatory Visit | Attending: Neurosurgery

## 2021-04-13 ENCOUNTER — Ambulatory Visit: Payer: Medicare Other | Admitting: Urgent Care

## 2021-04-13 DIAGNOSIS — Z7984 Long term (current) use of oral hypoglycemic drugs: Secondary | ICD-10-CM | POA: Insufficient documentation

## 2021-04-13 DIAGNOSIS — Z87891 Personal history of nicotine dependence: Secondary | ICD-10-CM | POA: Insufficient documentation

## 2021-04-13 DIAGNOSIS — Z981 Arthrodesis status: Secondary | ICD-10-CM

## 2021-04-13 DIAGNOSIS — E119 Type 2 diabetes mellitus without complications: Secondary | ICD-10-CM | POA: Diagnosis not present

## 2021-04-13 DIAGNOSIS — M4802 Spinal stenosis, cervical region: Secondary | ICD-10-CM | POA: Diagnosis not present

## 2021-04-13 DIAGNOSIS — G959 Disease of spinal cord, unspecified: Secondary | ICD-10-CM | POA: Diagnosis not present

## 2021-04-13 DIAGNOSIS — I10 Essential (primary) hypertension: Secondary | ICD-10-CM | POA: Diagnosis not present

## 2021-04-13 DIAGNOSIS — M4322 Fusion of spine, cervical region: Secondary | ICD-10-CM | POA: Diagnosis not present

## 2021-04-13 DIAGNOSIS — E7800 Pure hypercholesterolemia, unspecified: Secondary | ICD-10-CM | POA: Diagnosis not present

## 2021-04-13 DIAGNOSIS — Z79899 Other long term (current) drug therapy: Secondary | ICD-10-CM | POA: Insufficient documentation

## 2021-04-13 DIAGNOSIS — Z7982 Long term (current) use of aspirin: Secondary | ICD-10-CM | POA: Diagnosis not present

## 2021-04-13 HISTORY — DX: Obstructive sleep apnea (adult) (pediatric): G47.33

## 2021-04-13 HISTORY — DX: Other long term (current) drug therapy: Z79.899

## 2021-04-13 HISTORY — DX: Long term (current) use of unspecified immunomodulators and immunosuppressants: Z79.60

## 2021-04-13 HISTORY — PX: ANTERIOR CERVICAL DECOMP/DISCECTOMY FUSION: SHX1161

## 2021-04-13 HISTORY — DX: Atherosclerosis of aorta: I70.0

## 2021-04-13 LAB — CBC
HCT: 36.7 % — ABNORMAL LOW (ref 39.0–52.0)
Hemoglobin: 12.1 g/dL — ABNORMAL LOW (ref 13.0–17.0)
MCH: 27.8 pg (ref 26.0–34.0)
MCHC: 33 g/dL (ref 30.0–36.0)
MCV: 84.2 fL (ref 80.0–100.0)
Platelets: 175 10*3/uL (ref 150–400)
RBC: 4.36 MIL/uL (ref 4.22–5.81)
RDW: 17.2 % — ABNORMAL HIGH (ref 11.5–15.5)
WBC: 9.6 10*3/uL (ref 4.0–10.5)
nRBC: 0 % (ref 0.0–0.2)

## 2021-04-13 LAB — ABO/RH: ABO/RH(D): A POS

## 2021-04-13 LAB — GLUCOSE, CAPILLARY
Glucose-Capillary: 154 mg/dL — ABNORMAL HIGH (ref 70–99)
Glucose-Capillary: 175 mg/dL — ABNORMAL HIGH (ref 70–99)
Glucose-Capillary: 197 mg/dL — ABNORMAL HIGH (ref 70–99)

## 2021-04-13 LAB — CREATININE, SERUM
Creatinine, Ser: 0.73 mg/dL (ref 0.61–1.24)
GFR, Estimated: >60 mL/min (ref >60)

## 2021-04-13 SURGERY — ANTERIOR CERVICAL DECOMPRESSION/DISCECTOMY FUSION 2 LEVELS
Anesthesia: General

## 2021-04-13 MED ORDER — CHLORHEXIDINE GLUCONATE 0.12 % MT SOLN
OROMUCOSAL | Status: AC
  Administered 2021-04-13: 15 mL via OROMUCOSAL
  Filled 2021-04-13: qty 15

## 2021-04-13 MED ORDER — MORPHINE SULFATE (PF) 2 MG/ML IV SOLN
1.0000 mg | INTRAVENOUS | Status: DC | PRN
  Administered 2021-04-13: 1 mg via INTRAVENOUS

## 2021-04-13 MED ORDER — SUCCINYLCHOLINE CHLORIDE 200 MG/10ML IV SOSY
PREFILLED_SYRINGE | INTRAVENOUS | Status: DC | PRN
  Administered 2021-04-13: 100 mg via INTRAVENOUS

## 2021-04-13 MED ORDER — METHOCARBAMOL 1000 MG/10ML IJ SOLN
500.0000 mg | Freq: Four times a day (QID) | INTRAVENOUS | Status: DC | PRN
  Administered 2021-04-13: 500 mg via INTRAVENOUS
  Filled 2021-04-13 (×2): qty 5

## 2021-04-13 MED ORDER — SODIUM CHLORIDE 0.9 % IV SOLN: INTRAVENOUS | Status: DC

## 2021-04-13 MED ORDER — PROPOFOL 10 MG/ML IV BOLUS
INTRAVENOUS | Status: AC
  Filled 2021-04-13: qty 60

## 2021-04-13 MED ORDER — OXYCODONE HCL 5 MG PO TABS
10.0000 mg | ORAL_TABLET | ORAL | Status: DC | PRN
  Administered 2021-04-13: 10 mg via ORAL
  Filled 2021-04-13: qty 2

## 2021-04-13 MED ORDER — ACETAMINOPHEN 500 MG PO TABS
1000.0000 mg | ORAL_TABLET | Freq: Four times a day (QID) | ORAL | Status: DC
  Filled 2021-04-13: qty 2

## 2021-04-13 MED ORDER — ONDANSETRON HCL 4 MG/2ML IJ SOLN
INTRAMUSCULAR | Status: DC | PRN
  Administered 2021-04-13: 4 mg via INTRAVENOUS

## 2021-04-13 MED ORDER — DOCUSATE SODIUM 100 MG PO CAPS
100.0000 mg | ORAL_CAPSULE | Freq: Two times a day (BID) | ORAL | Status: DC
  Administered 2021-04-13: 100 mg via ORAL
  Filled 2021-04-13: qty 1

## 2021-04-13 MED ORDER — FENTANYL CITRATE (PF) 100 MCG/2ML IJ SOLN
INTRAMUSCULAR | Status: AC
  Filled 2021-04-13: qty 2

## 2021-04-13 MED ORDER — PHENYLEPHRINE HCL-NACL 20-0.9 MG/250ML-% IV SOLN
INTRAVENOUS | Status: DC | PRN
  Administered 2021-04-13: 40 ug/min via INTRAVENOUS

## 2021-04-13 MED ORDER — FLEET ENEMA 7-19 GM/118ML RE ENEM: 1.0000 | ENEMA | Freq: Once | RECTAL | Status: DC | PRN

## 2021-04-13 MED ORDER — PROPOFOL 10 MG/ML IV BOLUS
INTRAVENOUS | Status: AC
  Filled 2021-04-13: qty 20

## 2021-04-13 MED ORDER — GABAPENTIN 300 MG PO CAPS: 300.0000 mg | ORAL_CAPSULE | Freq: Every evening | ORAL | Status: DC | PRN

## 2021-04-13 MED ORDER — SENNA 8.6 MG PO TABS: 1.0000 | ORAL_TABLET | Freq: Two times a day (BID) | ORAL | 0 refills | Status: DC

## 2021-04-13 MED ORDER — HYDRALAZINE HCL 20 MG/ML IJ SOLN
10.0000 mg | Freq: Four times a day (QID) | INTRAMUSCULAR | Status: DC | PRN
  Administered 2021-04-13: 10 mg via INTRAVENOUS

## 2021-04-13 MED ORDER — ISOSORBIDE MONONITRATE ER 30 MG PO TB24: 15.0000 mg | ORAL_TABLET | ORAL | Status: DC

## 2021-04-13 MED ORDER — FENTANYL CITRATE (PF) 100 MCG/2ML IJ SOLN
25.0000 ug | INTRAMUSCULAR | Status: DC | PRN
  Administered 2021-04-13: 50 ug via INTRAVENOUS
  Administered 2021-04-13 (×2): 25 ug via INTRAVENOUS
  Administered 2021-04-13: 50 ug via INTRAVENOUS

## 2021-04-13 MED ORDER — MELATONIN 5 MG PO TABS: 5.0000 mg | ORAL_TABLET | Freq: Every evening | ORAL | Status: DC | PRN

## 2021-04-13 MED ORDER — LOSARTAN POTASSIUM 25 MG PO TABS: 12.5000 mg | ORAL_TABLET | ORAL | Status: DC

## 2021-04-13 MED ORDER — ROCURONIUM BROMIDE 10 MG/ML (PF) SYRINGE
PREFILLED_SYRINGE | INTRAVENOUS | Status: AC
  Filled 2021-04-13: qty 10

## 2021-04-13 MED ORDER — BUPIVACAINE-EPINEPHRINE (PF) 0.5% -1:200000 IJ SOLN
INTRAMUSCULAR | Status: DC | PRN
Start: 2021-04-13 — End: 2021-04-13
  Administered 2021-04-13: 7 mL

## 2021-04-13 MED ORDER — PHENYLEPHRINE HCL (PRESSORS) 10 MG/ML IV SOLN
INTRAVENOUS | Status: AC
  Filled 2021-04-13: qty 1

## 2021-04-13 MED ORDER — SODIUM CHLORIDE 0.9 % IV SOLN
INTRAVENOUS | Status: DC | PRN
  Administered 2021-04-13: .1 ug/kg/min via INTRAVENOUS

## 2021-04-13 MED ORDER — OXYCODONE HCL 5 MG/5ML PO SOLN: 5.0000 mg | Freq: Once | ORAL | Status: AC | PRN

## 2021-04-13 MED ORDER — EPHEDRINE SULFATE (PRESSORS) 50 MG/ML IJ SOLN
INTRAMUSCULAR | Status: DC | PRN
  Administered 2021-04-13 (×2): 10 mg via INTRAVENOUS
  Administered 2021-04-13: 5 mg via INTRAVENOUS

## 2021-04-13 MED ORDER — SENNA 8.6 MG PO TABS
1.0000 | ORAL_TABLET | Freq: Two times a day (BID) | ORAL | Status: DC
  Administered 2021-04-13: 8.6 mg via ORAL
  Filled 2021-04-13: qty 1

## 2021-04-13 MED ORDER — FOLIC ACID 1 MG PO TABS
1.0000 mg | ORAL_TABLET | Freq: Every day | ORAL | Status: DC
  Administered 2021-04-13: 1 mg via ORAL
  Filled 2021-04-13: qty 1

## 2021-04-13 MED ORDER — METHOCARBAMOL 500 MG PO TABS: 500.0000 mg | ORAL_TABLET | Freq: Four times a day (QID) | ORAL | 0 refills | Status: DC | PRN

## 2021-04-13 MED ORDER — FENTANYL CITRATE (PF) 100 MCG/2ML IJ SOLN
INTRAMUSCULAR | Status: DC | PRN
  Administered 2021-04-13 (×2): 100 ug via INTRAVENOUS

## 2021-04-13 MED ORDER — ACETAMINOPHEN 10 MG/ML IV SOLN
INTRAVENOUS | Status: DC | PRN
  Administered 2021-04-13: 1000 mg via INTRAVENOUS

## 2021-04-13 MED ORDER — HYDRALAZINE HCL 20 MG/ML IJ SOLN
INTRAMUSCULAR | Status: AC
  Administered 2021-04-13: 10 mg via INTRAVENOUS
  Filled 2021-04-13: qty 1

## 2021-04-13 MED ORDER — HYDROMORPHONE HCL 1 MG/ML IJ SOLN
0.5000 mg | INTRAMUSCULAR | Status: AC | PRN
  Administered 2021-04-13 (×2): 0.5 mg via INTRAVENOUS

## 2021-04-13 MED ORDER — GLYCOPYRROLATE 0.2 MG/ML IJ SOLN
INTRAMUSCULAR | Status: DC | PRN
  Administered 2021-04-13: .2 mg via INTRAVENOUS

## 2021-04-13 MED ORDER — METHOCARBAMOL 500 MG PO TABS: 500.0000 mg | ORAL_TABLET | Freq: Four times a day (QID) | ORAL | Status: DC | PRN

## 2021-04-13 MED ORDER — ENOXAPARIN SODIUM 40 MG/0.4ML IJ SOSY: 40.0000 mg | PREFILLED_SYRINGE | INTRAMUSCULAR | Status: DC

## 2021-04-13 MED ORDER — BUPIVACAINE-EPINEPHRINE (PF) 0.5% -1:200000 IJ SOLN
INTRAMUSCULAR | Status: AC
  Filled 2021-04-13: qty 30

## 2021-04-13 MED ORDER — OXYCODONE HCL 5 MG PO TABS
ORAL_TABLET | ORAL | Status: AC
  Administered 2021-04-13: 5 mg via ORAL
  Filled 2021-04-13: qty 1

## 2021-04-13 MED ORDER — ONDANSETRON HCL 4 MG PO TABS: 4.0000 mg | ORAL_TABLET | Freq: Four times a day (QID) | ORAL | Status: DC | PRN

## 2021-04-13 MED ORDER — DEXAMETHASONE SODIUM PHOSPHATE 10 MG/ML IJ SOLN
INTRAMUSCULAR | Status: DC | PRN
  Administered 2021-04-13: 5 mg via INTRAVENOUS

## 2021-04-13 MED ORDER — REMIFENTANIL HCL 1 MG IV SOLR
INTRAVENOUS | Status: AC
  Filled 2021-04-13: qty 1000

## 2021-04-13 MED ORDER — OXYCODONE HCL 5 MG PO TABS: 5.0000 mg | ORAL_TABLET | ORAL | Status: DC | PRN

## 2021-04-13 MED ORDER — HYDROMORPHONE HCL 1 MG/ML IJ SOLN
INTRAMUSCULAR | Status: AC
  Administered 2021-04-13: 0.5 mg via INTRAVENOUS
  Filled 2021-04-13: qty 1

## 2021-04-13 MED ORDER — METFORMIN HCL 500 MG PO TABS
1000.0000 mg | ORAL_TABLET | Freq: Two times a day (BID) | ORAL | Status: DC
Start: 2021-04-13 — End: 2021-04-14
  Administered 2021-04-13: 1000 mg via ORAL
  Filled 2021-04-13: qty 2

## 2021-04-13 MED ORDER — 0.9 % SODIUM CHLORIDE (POUR BTL) OPTIME
TOPICAL | Status: DC | PRN
Start: 2021-04-13 — End: 2021-04-13
  Administered 2021-04-13: 500 mL

## 2021-04-13 MED ORDER — SODIUM CHLORIDE 0.9% FLUSH: 3.0000 mL | INTRAVENOUS | Status: DC | PRN

## 2021-04-13 MED ORDER — ATENOLOL 25 MG PO TABS
50.0000 mg | ORAL_TABLET | Freq: Two times a day (BID) | ORAL | Status: DC
  Administered 2021-04-13: 50 mg via ORAL
  Filled 2021-04-13: qty 2

## 2021-04-13 MED ORDER — PANTOPRAZOLE SODIUM 40 MG PO TBEC
40.0000 mg | DELAYED_RELEASE_TABLET | Freq: Two times a day (BID) | ORAL | Status: DC
  Administered 2021-04-13: 40 mg via ORAL
  Filled 2021-04-13: qty 1

## 2021-04-13 MED ORDER — CEFAZOLIN SODIUM-DEXTROSE 2-4 GM/100ML-% IV SOLN
INTRAVENOUS | Status: AC
  Filled 2021-04-13: qty 100

## 2021-04-13 MED ORDER — BISACODYL 10 MG RE SUPP: 10.0000 mg | Freq: Every day | RECTAL | Status: DC | PRN

## 2021-04-13 MED ORDER — PHENOL 1.4 % MT LIQD: 1.0000 | OROMUCOSAL | Status: DC | PRN

## 2021-04-13 MED ORDER — ONDANSETRON HCL 4 MG/2ML IJ SOLN: 4.0000 mg | Freq: Once | INTRAMUSCULAR | Status: DC | PRN

## 2021-04-13 MED ORDER — LIDOCAINE HCL (CARDIAC) PF 100 MG/5ML IV SOSY
PREFILLED_SYRINGE | INTRAVENOUS | Status: DC | PRN
  Administered 2021-04-13: 100 mg via INTRAVENOUS

## 2021-04-13 MED ORDER — MENTHOL 3 MG MT LOZG: 1.0000 | LOZENGE | OROMUCOSAL | Status: DC | PRN

## 2021-04-13 MED ORDER — ROSUVASTATIN CALCIUM 10 MG PO TABS
10.0000 mg | ORAL_TABLET | Freq: Every day | ORAL | Status: DC
  Administered 2021-04-13: 10 mg via ORAL
  Filled 2021-04-13: qty 1

## 2021-04-13 MED ORDER — FENTANYL CITRATE (PF) 100 MCG/2ML IJ SOLN
50.0000 ug | Freq: Once | INTRAMUSCULAR | Status: AC
  Administered 2021-04-13: 50 ug via INTRAVENOUS

## 2021-04-13 MED ORDER — SODIUM CHLORIDE 0.9% FLUSH
3.0000 mL | Freq: Two times a day (BID) | INTRAVENOUS | Status: DC
  Administered 2021-04-13: 3 mL via INTRAVENOUS

## 2021-04-13 MED ORDER — ACETAMINOPHEN 10 MG/ML IV SOLN: 1000.0000 mg | Freq: Once | INTRAVENOUS | Status: DC | PRN

## 2021-04-13 MED ORDER — SODIUM CHLORIDE 0.9 % IV SOLN
250.0000 mL | INTRAVENOUS | Status: DC
  Administered 2021-04-13: 250 mL via INTRAVENOUS

## 2021-04-13 MED ORDER — MORPHINE SULFATE (PF) 4 MG/ML IV SOLN
INTRAVENOUS | Status: AC
  Filled 2021-04-13: qty 1

## 2021-04-13 MED ORDER — CYANOCOBALAMIN 1000 MCG/ML IJ SOLN: 1000.0000 ug | INTRAMUSCULAR | Status: DC

## 2021-04-13 MED ORDER — PROPOFOL 500 MG/50ML IV EMUL
INTRAVENOUS | Status: DC | PRN
  Administered 2021-04-13: 100 ug/kg/min via INTRAVENOUS

## 2021-04-13 MED ORDER — POLYETHYLENE GLYCOL 3350 17 G PO PACK: 17.0000 g | PACK | Freq: Every day | ORAL | Status: DC | PRN

## 2021-04-13 MED ORDER — ACETAMINOPHEN 500 MG PO TABS: 1000.0000 mg | ORAL_TABLET | Freq: Every day | ORAL | Status: DC | PRN

## 2021-04-13 MED ORDER — SALINE SPRAY 0.65 % NA SOLN
1.0000 | NASAL | Status: DC | PRN
  Filled 2021-04-13: qty 44

## 2021-04-13 MED ORDER — OXYCODONE HCL 5 MG PO TABS: 5.0000 mg | ORAL_TABLET | Freq: Once | ORAL | Status: AC | PRN

## 2021-04-13 MED ORDER — PHENYLEPHRINE HCL-NACL 20-0.9 MG/250ML-% IV SOLN
INTRAVENOUS | Status: AC
  Filled 2021-04-13: qty 250

## 2021-04-13 MED ORDER — PROPOFOL 10 MG/ML IV BOLUS
INTRAVENOUS | Status: DC | PRN
  Administered 2021-04-13: 140 mg via INTRAVENOUS

## 2021-04-13 MED ORDER — SEVOFLURANE IN SOLN
RESPIRATORY_TRACT | Status: AC
  Filled 2021-04-13: qty 250

## 2021-04-13 MED ORDER — ATROPINE SULFATE 0.4 MG/ML IV SOLN
INTRAVENOUS | Status: DC | PRN
  Administered 2021-04-13: .2 mg via INTRAVENOUS

## 2021-04-13 MED ORDER — ONDANSETRON HCL 4 MG/2ML IJ SOLN: 4.0000 mg | Freq: Four times a day (QID) | INTRAMUSCULAR | Status: DC | PRN

## 2021-04-13 MED ORDER — SURGIFLO WITH THROMBIN (HEMOSTATIC MATRIX KIT) OPTIME
TOPICAL | Status: DC | PRN
  Administered 2021-04-13 (×2): 1 via TOPICAL

## 2021-04-13 MED ORDER — OXYCODONE HCL 10 MG PO TABS
10.0000 mg | ORAL_TABLET | ORAL | 0 refills | Status: AC | PRN
Start: 2021-04-13 — End: 2021-04-18

## 2021-04-13 SURGICAL SUPPLY — 62 items
BULB RESERV EVAC DRAIN JP 100C (MISCELLANEOUS) ×1 IMPLANT
BUR NEURO DRILL SOFT 3.0X3.8M (BURR) ×2 IMPLANT
CHLORAPREP W/TINT 26 (MISCELLANEOUS) ×4 IMPLANT
COUNTER NEEDLE 20/40 LG (NEEDLE) ×2 IMPLANT
CUP MEDICINE 2OZ PLAST GRAD ST (MISCELLANEOUS) ×2 IMPLANT
DERMABOND ADVANCED (GAUZE/BANDAGES/DRESSINGS) ×1
DERMABOND ADVANCED .7 DNX12 (GAUZE/BANDAGES/DRESSINGS) ×1 IMPLANT
DRAIN JP 10F RND SILICONE (MISCELLANEOUS) ×1 IMPLANT
DRAPE C ARM PK CFD 31 SPINE (DRAPES) ×2 IMPLANT
DRAPE LAPAROTOMY 77X122 PED (DRAPES) ×2 IMPLANT
DRAPE MICROSCOPE SPINE 48X150 (DRAPES) ×2 IMPLANT
DRAPE SURG 17X11 SM STRL (DRAPES) ×8 IMPLANT
DRAPE UTILITY W/TAPE 26X15 (DRAPES) ×1 IMPLANT
DRSG TEGADERM 2-3/8X2-3/4 SM (GAUZE/BANDAGES/DRESSINGS) ×1 IMPLANT
ELECT CAUTERY BLADE TIP 2.5 (TIP) ×2
ELECT REM PT RETURN 9FT ADLT (ELECTROSURGICAL) ×2
ELECTRODE CAUTERY BLDE TIP 2.5 (TIP) ×1 IMPLANT
ELECTRODE REM PT RTRN 9FT ADLT (ELECTROSURGICAL) ×1 IMPLANT
FEE INTRAOP CADWELL SUPPLY NCS (MISCELLANEOUS) ×1 IMPLANT
FEE INTRAOP MONITOR IMPULS NCS (MISCELLANEOUS) IMPLANT
GAUZE 4X4 16PLY ~~LOC~~+RFID DBL (SPONGE) ×2 IMPLANT
GLOVE SURG SYN 6.5 ES PF (GLOVE) ×4 IMPLANT
GLOVE SURG SYN 6.5 PF PI (GLOVE) ×2 IMPLANT
GLOVE SURG SYN 8.5  E (GLOVE) ×3
GLOVE SURG SYN 8.5 E (GLOVE) ×3 IMPLANT
GLOVE SURG SYN 8.5 PF PI (GLOVE) ×3 IMPLANT
GLOVE SURG UNDER POLY LF SZ6.5 (GLOVE) ×4 IMPLANT
GOWN SRG LRG LVL 4 IMPRV REINF (GOWNS) ×2 IMPLANT
GOWN SRG XL LVL 3 NONREINFORCE (GOWNS) ×1 IMPLANT
GOWN STRL NON-REIN TWL XL LVL3 (GOWNS) ×1
GOWN STRL REIN LRG LVL4 (GOWNS) ×2
GRADUATE 1200CC STRL 31836 (MISCELLANEOUS) ×2 IMPLANT
GRAFT DURAGEN MATRIX 1WX1L (Tissue) IMPLANT
INTRAOP CADWELL SUPPLY FEE NCS (MISCELLANEOUS) ×1
INTRAOP DISP SUPPLY FEE NCS (MISCELLANEOUS) ×1
INTRAOP MONITOR FEE IMPULS NCS (MISCELLANEOUS) ×1
INTRAOP MONITOR FEE IMPULSE (MISCELLANEOUS) ×1
KIT TURNOVER KIT A (KITS) ×2 IMPLANT
MANIFOLD NEPTUNE II (INSTRUMENTS) ×2 IMPLANT
MARKER SKIN DUAL TIP RULER LAB (MISCELLANEOUS) ×3 IMPLANT
NDL SAFETY ECLIPSE 18X1.5 (NEEDLE) ×1 IMPLANT
NEEDLE HYPO 18GX1.5 SHARP (NEEDLE) ×1
NEEDLE HYPO 22GX1.5 SAFETY (NEEDLE) ×2 IMPLANT
NS IRRIG 500ML POUR BTL (IV SOLUTION) ×1 IMPLANT
PACK LAMINECTOMY NEURO (CUSTOM PROCEDURE TRAY) ×2 IMPLANT
PAD ARMBOARD 7.5X6 YLW CONV (MISCELLANEOUS) ×4 IMPLANT
PIN CASPAR 14 (PIN) ×1 IMPLANT
PIN CASPAR 14MM (PIN) ×2 IMPLANT
PLATE ANT CERV XTEND 2 LV 36 (Plate) ×1 IMPLANT
PUTTY DBX 1CC (Putty) IMPLANT
SCREW VAR 4.2 XD SELF DRILL 16 (Screw) ×6 IMPLANT
SPACER CERVICAL FRGE 12X14X7-7 (Spacer) ×2 IMPLANT
SPONGE GAUZE 2X2 8PLY STRL LF (GAUZE/BANDAGES/DRESSINGS) ×1 IMPLANT
SPONGE KITTNER 5P (MISCELLANEOUS) ×2 IMPLANT
STAPLER SKIN PROX 35W (STAPLE) ×1 IMPLANT
SURGIFLO W/THROMBIN 8M KIT (HEMOSTASIS) ×2 IMPLANT
SUT V-LOC 90 ABS DVC 3-0 CL (SUTURE) ×1 IMPLANT
SUT VIC AB 3-0 SH 8-18 (SUTURE) ×2 IMPLANT
SYR 30ML LL (SYRINGE) ×2 IMPLANT
TAPE CLOTH 3X10 WHT NS LF (GAUZE/BANDAGES/DRESSINGS) ×2 IMPLANT
TOWEL OR 17X26 4PK STRL BLUE (TOWEL DISPOSABLE) ×6 IMPLANT
TUBING CONNECTING 10 (TUBING) ×2 IMPLANT

## 2021-04-13 NOTE — Transfer of Care (Signed)
Immediate Anesthesia Transfer of Care Note ? ?Patient: Shane Mcknight ? ?Procedure(s) Performed: C3-5 ANTERIOR CERVICAL DISCECTOMY AND FUSION (GLOBUS ALLOGRAFT) ? ?Patient Location: PACU ? ?Anesthesia Type:General ? ?Level of Consciousness: awake, drowsy and patient cooperative ? ?Airway & Oxygen Therapy: Patient Spontanous Breathing ? ?Post-op Assessment: Report given to RN and Post -op Vital signs reviewed and stable ? ?Post vital signs: Reviewed ? ?Last Vitals:  ?Vitals Value Taken Time  ?BP 164/93 04/13/21 0945  ?Temp 36.5 ?C 04/13/21 0942  ?Pulse 71 04/13/21 0947  ?Resp 12 04/13/21 0947  ?SpO2 95 % 04/13/21 0947  ?Vitals shown include unvalidated device data. ? ?Last Pain:  ?Vitals:  ? 04/13/21 0600  ?TempSrc: Oral  ?PainSc: 5   ?   ? ?  ? ?Complications: No notable events documented. ?

## 2021-04-13 NOTE — H&P (Signed)
I have reviewed and confirmed my history and physical from 03/30/2021 with no additions or changes. Plan for C3-5 ACDF.  Risks and benefits reviewed. ? ?Heart sounds normal no MRG. Chest Clear to Auscultation Bilaterally. ? ? ?  ?

## 2021-04-13 NOTE — Discharge Instructions (Signed)
?Your surgeon has performed an operation on your cervical spine (neck) to relieve pressure on the spinal cord and/or nerves. This involved making an incision in the front of your neck and removing one or more of the discs that support your spine. Next, a small piece of bone, a titanium plate, and screws were used to fuse two or more of the vertebrae (bones) together. ? ?The following are instructions to help in your recovery once you have been discharged from the hospital. Even if you feel well, it is important that you follow these activity guidelines. If you do not let your neck heal properly from the surgery, you can increase the chance of return of your symptoms and other complications. ? ?* Do not take anti-inflammatory medications for 3 months after surgery (naproxen [Aleve], ibuprofen [Advil, Motrin]. These medications can prevent your bones from healing properly. ?*Hold Cosentyx for 3 weeks post-op. May resume Methotrexate normally.  ? ?Activity  ?  ?No bending, lifting, or twisting (?BLT?). Avoid lifting objects heavier than 10 pounds (gallon milk jug).  Where possible, avoid household activities that involve lifting, bending, reaching, pushing, or pulling such as laundry, vacuuming, grocery shopping, and childcare. Try to arrange for help from friends and family for these activities while your back heals. ? ?Increase physical activity slowly as tolerated.  Taking short walks is encouraged, but avoid strenuous exercise. Do not jog, run, bicycle, lift weights, or participate in any other exercises unless specifically allowed by your doctor. ? ?Talk to your doctor before resuming sexual activity. ? ?You should not drive until cleared by your doctor. ? ?Until released by your doctor, you should not return to work or school.  You should rest at home and let your body heal.  ? ?You may shower three days after your surgery.  After showering, lightly dab your incision dry. Do not take a tub bath or go swimming  until approved by your doctor at your follow-up appointment. ? ?If your doctor ordered a cervical collar (neck brace) for you, you should wear it whenever you are out of bed. You may remove it when lying down or sleeping, but you should wear it at all other times. Not all neck surgeries require a cervical collar. ? ?If you smoke, we strongly recommend that you quit.  Smoking has been proven to interfere with normal bone healing and will dramatically reduce the success rate of your surgery. Please contact [facility] (800-QUIT-NOW) and use the resources at www.[facility].com for assistance in stopping smoking. ? ?Surgical Incision ?  ?Keep your incision area clean and dry. ? ?Your incision was closed with Dermabond glue. The glue should begin to peel away within about a week. ? ?Diet          ? ?You may return to your usual diet. However, you may experience discomfort when swallowing in the first month after your surgery. This is normal. You may find that softer foods are more comfortable for you to swallow. Be sure to stay hydrated. ? ?When to Contact us ? ?You may experience pain in your neck and/or pain between your shoulder blades. This is normal and should improve in the next few weeks with the help of pain medication, muscle relaxers, and rest. Some patients report that a warm compress on the back of the neck or between the shoulder blades helps. ? ?However, should you experience any of the following, contact us immediately: ?New numbness or weakness ?Pain that is progressively getting worse, and is not relieved  by your pain medication, muscle relaxers, rest, and warm compresses ?Bleeding, redness, swelling, pain, or drainage from surgical incision ?Chills or flu-like symptoms ?Fever greater than 101.0 F (38.3 C) ?Inability to eat, drink fluids, or take medications ?Problems with bowel or bladder functions ?Difficulty breathing or shortness of breath ?Warmth, tenderness, or swelling in your calf ?Contact  Information ?During office hours (Monday-Friday 9 am to 5 pm), please call your physician at [phone] and ask for Berdine Addison ?After hours and weekends, please call [phone] and speak with the answering service, who will contact the doctor on call.  If that fails, call the [facility] Operator at [phone] and ask for the Neurosurgery Resident On Call  ?For a life-threatening emergency, call 911 ?  ?

## 2021-04-13 NOTE — Progress Notes (Signed)
Patient was given verbal and written discharge instructions, he acknowledge understanding and states he will comply, Taken to car by wheelchair. ?

## 2021-04-13 NOTE — Op Note (Signed)
Indications: Mr. Kantner is a 71 yo male who presented with: ?Cervical myelopathy G95.9, Degenerative cervical spinal stenosis M48.02 ? ?He had worsening symptoms prompting surgery ? ?Findings: cervical stenosis ? ?Preoperative Diagnosis: Cervical myelopathy G95.9, Degenerative cervical spinal stenosis M48.02 ?Postoperative Diagnosis: same ? ? ?EBL: 25 ml ?IVF: see AR ml ?Drains: 1 placed ?Disposition: Extubated and Stable to PACU ?Complications: none ? ?No foley catheter was placed. ? ? ?Preoperative Note:  ? ?Risks of surgery discussed include: infection, bleeding, stroke, coma, death, paralysis, CSF leak, nerve/spinal cord injury, numbness, tingling, weakness, complex regional pain syndrome, recurrent stenosis and/or disc herniation, vascular injury, development of instability, neck/back pain, need for further surgery, persistent symptoms, development of deformity, and the risks of anesthesia. The patient understood these risks and agreed to proceed. ? ? ?Procedure:  ?1) Anterior cervical diskectomy and fusion at C3/4 and C4/5 ?2) Anterior cervical instrumentation at C3 - 5 using Globus Xtend ?3) Placement of structural allograft  ?4) Use of operative microscope ?5) Use of flouroscopy ?  ?Procedure: After obtaining informed consent, the patient taken to the operating room, placed in supine position, general anesthesia induced.  The patient had a small shoulder roll placed behind their shoulders.  The patient received preop antibiotics and IV Decadron.  The patient had a neck incision outlined, was prepped and draped in usual sterile fashion. The incision was injected with local anesthetic.   An incision was opened, dissection taken down medial to the carotid artery and jugular vein, lateral to the trachea and esophagus.  The prevertebral fascia identified and a localizing x-ray demonstrated the correct level.  The longus colli were dissected laterally, and self-retaining retractors placed to open the operative  field. The microscope was then brought into the field. ? ?With this complete, distractor pins were placed in the vertebral bodies of C3 and C5. The distractor was placed, and the anuli at C3/4 and C4/5 were opened using a bovie.  Curettes and pituitary rongeurs used to remove the majority of disk, then the drill was used to remove the posterior osteophyte and begin the foraminotomies. The nerve hook was used to elevate the posterior longitudinal ligament, which was then removed with Kerrison rongeurs. The microblunt nerve hook could be passed out the foramen bilaterally at each level.   Meticulous hemostasis was obtained.  Structural allograft was tapped behind the anterior lip of the vertebral body at C3/4 (7 mm) and C4/5 (7 mm).   ? ?The caspar distractor was removed, and bone wax used for hemostasis. A separate, 36 mm Globus Xtend plate was chosen.  Two screws placed in each vertebral body, respectively making sure the screws were behind the locking mechanism.  Final AP and lateral radiographs were taken.  ? ?With everything in good position, the wound was irrigated copiously with bacitracin-containing solution and meticulous hemostasis obtained.  A Drain was placed. ? ?Wound was closed in 2 layers using interrupted inverted 3-0 Vicryl sutures.  The wound was dressed with dermabond, the head of bed at 30 degrees, taken to recovery room in stable condition.  No new postop neurological deficits were identified. ? ?Sponge and pattie counts were correct at the end of the procedure.  ? ? ?I performed the entire procedure with the assistance of Cooper Render PA as an Pensions consultant. ? ?Meade Maw MD ? ? ? ? ? ?

## 2021-04-13 NOTE — Anesthesia Postprocedure Evaluation (Signed)
Anesthesia Post Note ? ?Patient: Shane Mcknight ? ?Procedure(s) Performed: C3-5 ANTERIOR CERVICAL DISCECTOMY AND FUSION (GLOBUS ALLOGRAFT) ? ?Patient location during evaluation: PACU ?Anesthesia Type: General ?Level of consciousness: awake and alert ?Pain management: pain level controlled ?Vital Signs Assessment: post-procedure vital signs reviewed and stable ?Respiratory status: spontaneous breathing, nonlabored ventilation, respiratory function stable and patient connected to nasal cannula oxygen ?Cardiovascular status: blood pressure returned to baseline and stable ?Postop Assessment: no apparent nausea or vomiting ?Anesthetic complications: no ? ? ?No notable events documented. ? ? ?Last Vitals:  ?Vitals:  ? 04/13/21 1400 04/13/21 1420  ?BP: (!) 175/94 (!) 201/93  ?Pulse: 61 74  ?Resp: 11   ?Temp:    ?SpO2: 97% 96%  ?  ?Last Pain:  ?Vitals:  ? 04/13/21 1400  ?TempSrc:   ?PainSc: 5   ? ? ?  ?  ?  ?  ?  ?  ? ?Arita Miss ? ? ? ? ?

## 2021-04-13 NOTE — Anesthesia Preprocedure Evaluation (Addendum)
Anesthesia Evaluation  ?Patient identified by MRN, date of birth, ID band ?Patient awake ? ? ? ?Reviewed: ?Allergy & Precautions, NPO status , Patient's Chart, lab work & pertinent test results ? ?History of Anesthesia Complications ?Negative for: history of anesthetic complications ? ?Airway ?Mallampati: II ? ?TM Distance: >3 FB ?Neck ROM: Limited ? ? ? Dental ? ?(+) Edentulous Upper, Edentulous Lower ?  ?Pulmonary ?sleep apnea , neg COPD, Patient abstained from smoking.Not current smoker, former smoker,  ?  ?Pulmonary exam normal ?breath sounds clear to auscultation ? ? ? ? ? ? Cardiovascular ?Exercise Tolerance: Good ?METShypertension, Pt. on medications ?+ CAD and + Cardiac Stents  ?(-) Past MI (-) dysrhythmias  ?Rhythm:Regular Rate:Normal ?- Systolic murmurs ?? Diagnostic left heart catheterization performed on 09/14/2018 revealing multivessel CAD; 40% ostial LM-LM, 50% mid LAD, 30% proximal LAD, 60% ostial D1-D1, 40% ostial LCx, 100% OM1, 60% proximal to mid RCA, and 50% RPAV.  Previously placed stent to the distal RCA was noted to be widely patent.  Further intervention deferred opting for medical management. ?? ?? Last myocardial perfusion imaging study was performed on 03/31/2020 revealing a normal left ventricular systolic function with an EF of 55-65%.  CT attenuation images show evidence of aortic and coronary calcification.  There was no evidence of stress-induced myocardial ischemia or arrhythmia.  Study determined to be normal and low risk. ?? ?? Last TTE was performed on 04/06/2020 revealing normal left ventricular systolic function with an EF of 55-60%.  Diastolic Doppler parameters consistent with pseudonormalization (G2DD).  Left atrium was mildly dilated.  No significant valvular regurgitation noted.  There was no evidence of a significant transvalvular gradient to suggest valvular stenosis. ?  ?Neuro/Psych ? Headaches, negative psych ROS  ? GI/Hepatic ?GERD   Controlled,(+)  ?  ? (-) substance abuse ? ,   ?Endo/Other  ?diabetes ? Renal/GU ?CRFRenal disease  ? ?  ?Musculoskeletal ? ?(+) Arthritis ,  ? Abdominal ?  ?Peds ? Hematology ? ?(+) Blood dyscrasia, anemia ,   ?Anesthesia Other Findings ?Past Medical History: ?No date: Allergic state ?No date: Anemia ?No date: Aortic atherosclerosis ([place]) ?No date: Arthritis ?    Comment:  spine, hands, shoulder with previous cuff tear ?No date: B12 deficiency ?No date: CAD (coronary artery disease) ?    Comment:  a.) 11/2015 CT: Coronary Ca2+ noted. b.) LHC 06/14/2016: ?             60% pRCA, 60% RPLB-2, 99% OM1, 50% mLAD, 40% oLM-LM, 30%  ?             pLAD, 60% oD1-D1, 60% oLCx, 95% dRCA; PCI placing a 2.5 x ?             66m Resolute Onyx DES dRCA. c.) LHC 09/14/2018: 40%  ?             oLM-LM, 50% mLAD, 30% pLAD, 60% oD1-D1, 40% oLCx, 100%  ?             OM1, 60% p-mRCA, 50% RPAV; dRCA stent widely patent;  ?             medical mgmt. ?No date: Candidiasis of esophagus (HCC) ?04/17/2020: Carotid artery stenosis ?    Comment:  a.) Carotid doppler 058/52/7782 442-35%RICA/LICA  ?             stenosis. ?No date: Colon polyp ?No date: DDD (degenerative disc disease), lumbosacral ?036/14/4315 Diastolic dysfunction ?    Comment:  a.)  TTE 04/06/2020; EF 55-60%; mild LA dilation; no  ?             RWMAs; G2DD. ?No date: Dysphagia ?No date: Effort angina ([place]) ?No date: Elevated PSA ?No date: Elevated transaminase level ?No date: Esophageal stricture ?    Comment:  a.) s/p multiple dilations ?No date: Esophagitis, [place] grade D ?No date: Fatty liver ?No date: Ganglion cyst of finger of right hand ?No date: GERD (gastroesophageal reflux disease) ?No date: Guaiac positive stools ?No date: Hiatal hernia ?No date: HNP (herniated nucleus pulposus), lumbar ?No date: Hypercholesterolemia ?No date: Hypertension ?No date: Long term current use of immunosuppressive drug ?    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    Comment:  a.) does not use nocturnal PAP therapy ?No date: Psoriasis ?No date: Psoriatic arthritis ([place]) ?    Comment:  a.) severe; Tx'd with MTX + secukinumab ?No date: PVC (premature ventricular contraction) ?No date: Schatzki's ring ?No date: Type II diabetes mellitus ([place]) ? Reproductive/Obstetrics ? ?  ? ? ? ? ? ? ? ? ? ? ? ? ? ?  ?  ? ? ? ? ? ? ?                                  Anesthesia Evaluation  ?Patient identified by MRN, date of birth, ID band ?Patient awake ? ? ? ?Reviewed: ?Allergy & Precautions, NPO status , Patient's Chart, lab work & pertinent test results, reviewed documented beta blocker date and time  ? ?Airway ?Mallampati: II ? ?TM Distance: >3 FB ? ? ? ? Dental ? ?(+) Chipped ?  ?Pulmonary ?sleep apnea , former smoker,  ?  ? ? ? ? ? ? ? Cardiovascular ?hypertension, Pt. on medications and Pt. on home beta blockers ?+ angina + CAD and + Cardiac Stents  ? ? ? ?  ?Neuro/Psych ? Headaches,  Neuromuscular disease   ? GI/Hepatic ?hiatal hernia, GERD  ,  ?Endo/Other  ?diabetes, Type 2 ? Renal/GU ?  ? ?  ?Musculoskeletal ? ?(+) Arthritis ,  ? Abdominal ?  ?Peds ? Hematology ? ?(+) anemia ,   ?Anesthesia Other Findings ?EKG ok. EF 55-65. ? Reproductive/Obstetrics ? ?  ? ? ? ? ? ? ? ? ? ? ? ? ? ?  ?  ? ? ? ? ? ? ? ? ?Anesthesia Physical ?Anesthesia Plan ? ?ASA: III ? ?Anesthesia Plan: General  ? ?Post-op Pain Management:   ? ?Induction: Intravenous ? ?PONV Risk Score and Plan:  ? ?Airway Management Planned:  ? ?Additional Equipment:  ? ?Intra-op Plan:  ? ?Post-operative Plan:  ? ?Informed Consent: I have reviewed the patients History and Physical, chart, labs and discussed the procedure including the risks, benefits and alternatives for the proposed anesthesia with the patient or authorized representative who has indicated his/her understanding and  acceptance.  ? ? ? ? ? ?Plan Discussed with: CRNA ? ?Anesthesia Plan Comments:   ? ? ? ? ? ? ?Anesthesia Quick Evaluation ? ?Anesthesia Physical ?Anesthesia Plan ? ?ASA: 3 ? ?Anesthesia Plan: General  ? ?Post-op Pain Management: Ofirmev IV (intra-op)* and Dilaudid IV  ? ?Induction: Intravenous ? ?PONV Risk Score and Plan: 3 and Ondansetron, Dexamethasone, TIVA, Propofol infusion and Midazolam ? ?Airway Management Planned:  Oral ETT ? ?Additional Equipment: None ? ?Intra-op Plan:  ? ?Post-operative Plan: Extubation in OR ? ?Informed Consent: I have reviewed the patients History and Physical, chart, labs and discussed the procedure including the risks, benefits and alternatives for the proposed anesthesia with the patient or authorized representative who has indicated his/her understanding and acceptance.  ? ? ? ?Dental advisory given ? ?Plan Discussed with: CRNA and Surgeon ? ?Anesthesia Plan Comments: (Discussed risks of anesthesia with patient, including PONV, sore throat, lip/dental/eye damage, post operative cognitive dysfunction. Rare risks discussed as well, such as cardiorespiratory and neurological sequelae, and allergic reactions. Discussed the role of CRNA in patient's perioperative care. Patient understands.)  ? ? ? ? ? ? ?Anesthesia Quick Evaluation ? ?

## 2021-04-13 NOTE — Discharge Summary (Signed)
Physician Discharge Summary  ?Patient ID: ?Shane Mcknight ?MRN: [id number] ?DOB/AGE: [birth year] 71 y.o. ? ?Admit date: 04/13/2021 ?Discharge date: 04/13/2021 ? ?Admission Diagnoses: cervical myelopathy ? ?Discharge Diagnoses:  ?Principal Problem: ?  S/P cervical spinal fusion ? ? ?Discharged Condition: good ? ?Hospital Course:  ?Shane Mcknight is a 71 y.o s/p C3-5 ACDF. His surgery was uncomplicated and he was monitor post-op. He was discharged home after ambulating, urinating, and tolerating PO intake. He was given prescriptions for pain medication and muscle relaxer.  ? ?Consults: None ? ?Significant Diagnostic Studies: none  ? ?Treatments: surgery: As above. Please see separately dictated operative report for further details  ? ?Discharge Exam: ?Blood pressure (!) 169/88, pulse 90, temperature 98.2 ?F (36.8 ?C), resp. rate 18, height 5' 8.5" (1.74 m), weight 81.6 kg, SpO2 97 %. ? CN II-XII grossly intact ?LUE 5/5 throughout  ?RUE 4+ deltoid, IO and HG otherwise 5/5 throughout ? ?Disposition: Discharge disposition: 01-Home or Self Care ? ? ? ? ? ? ?Discharge Instructions   ? ? Incentive spirometry RT   Complete by: As directed ?  ? Remove dressing in 24 hours   Complete by: As directed ?  ? ?  ? ?Allergies as of 04/13/2021   ? ?   Reactions  ? Ephedrine Other (See Comments)  ? Causing prostate to hurt. Using for sinus bronchodilators ?Pain in groin, prostate  ? Nsaids Other (See Comments)  ? GI upset, history of esophagitis and Mallory-Weis tear  ? Other Rash, Other (See Comments), Nausea Only  ? Paper tape causes rash ?Plastic tape is okay ?GI upset, history of esophagitis and Mallory-Weis tear  ? Vancomycin Itching, Rash  ? Same as pcn with itching  ? Amoxicillin Itching, Rash  ? Hyoscyamine Sulfate Other (See Comments)  ? Urinary retention ?Urine retention  ? Levsin [hyoscyamine Sulfate] Other (See Comments)  ? Urinary retention  ? Naproxen Other (See Comments)  ? GI upset ?Also happens with other NSAIDS  ? Penicillins  Itching, Rash  ? TOLERATED CEFAZOLIN ?Has patient had a PCN reaction causing immediate rash, facial/tongue/throat swelling, SOB or lightheadedness with hypotension: yes ?Has patient had a PCN reaction causing severe rash involving mucus membranes or skin necrosis: no ?Has patient had a PCN reaction that required hospitalization: no ?Has patient had a PCN reaction occurring within the last 10 years: no ?If all of the above answers are "NO", then may proceed with Cephalosporin use.  ? Shellfish Allergy Itching, Nausea Only, Rash  ? Ingested shellfish cCuses rash and itching along with stomach sickness. ?Pt tolerates betadine   ? Sudafed [pseudoephedrine Hcl] Other (See Comments)  ? Constricts prostate flow (from ephedrine d/t sinus meds)  ? ?  ? ?  ?Medication List  ?  ? ?STOP taking these medications   ? ?Cosentyx Sensoready Pen 150 MG/ML Soaj ?Generic drug: Secukinumab ?  ?traMADol 50 MG tablet ?Commonly known as: ULTRAM ?  ? ?  ? ?TAKE these medications   ? ?acetaminophen 500 MG tablet ?Commonly known as: TYLENOL ?Take 1,000 mg by mouth daily as needed for moderate pain. ?  ?aspirin EC 81 MG tablet ?Take 81 mg by mouth every evening. ?  ?atenolol 50 MG tablet ?Commonly known as: TENORMIN ?TAKE 1 TABLET BY MOUTH  TWICE DAILY ?  ?cyanocobalamin 1000 MCG/ML injection ?Commonly known as: (VITAMIN B-12) ?Inject 1 mL (1,000 mcg total) into the muscle every 30 (thirty) days. ?  ?folic acid 1 MG tablet ?Commonly known as: FOLVITE ?Take 1 mg  by mouth daily. ?  ?gabapentin 300 MG capsule ?Commonly known as: NEURONTIN ?TAKE 1 TO 2 CAPSULES BY  MOUTH AT BEDTIME ?What changed: See the new instructions. ?  ?isosorbide mononitrate 30 MG 24 hr tablet ?Commonly known as: IMDUR ?Take 0.5 tablets (15 mg total) by mouth daily. ?What changed: when to take this ?  ?losartan 25 MG tablet ?Commonly known as: COZAAR ?TAKE ONE-HALF TABLET BY  MOUTH DAILY ?What changed: when to take this ?  ?melatonin 5 MG Tabs ?Take 5 mg by mouth at bedtime  as needed. ?  ?metFORMIN 500 MG tablet ?Commonly known as: GLUCOPHAGE ?TAKE 2 TABLETS BY MOUTH  TWICE DAILY WITH A MEAL ?  ?methocarbamol 500 MG tablet ?Commonly known as: ROBAXIN ?Take 1 tablet (500 mg total) by mouth every 6 (six) hours as needed for muscle spasms. ?  ?methotrexate 2.5 MG tablet ?Commonly known as: RHEUMATREX ?Take 8 tablets (20 mg total) by mouth once a week. Take 8 tablets every Friday. ?  ?nitroGLYCERIN 0.4 MG SL tablet ?Commonly known as: NITROSTAT ?Place 1 tablet (0.4 mg total) under the tongue every 5 (five) minutes as needed. MAXIMUM OF 3 DOSES. ?  ?Oxycodone HCl 10 MG Tabs ?Take 1 tablet (10 mg total) by mouth every 4 (four) hours as needed for up to 5 days for severe pain ((score 7 to 10)). ?  ?pantoprazole 40 MG tablet ?Commonly known as: PROTONIX ?TAKE 1 TABLET BY MOUTH  TWICE DAILY ?  ?PROPYLENE GLYCOL-GLYCERIN OP ?Apply 2 drops to eye as needed. ?  ?rosuvastatin 10 MG tablet ?Commonly known as: CRESTOR ?TAKE 1 TABLET BY MOUTH  DAILY ?What changed: when to take this ?  ?senna 8.6 MG Tabs tablet ?Commonly known as: SENOKOT ?Take 1 tablet (8.6 mg total) by mouth 2 (two) times daily. ?  ?sodium chloride 0.65 % Soln nasal spray ?Commonly known as: OCEAN ?Place 1 spray into both nostrils as needed for congestion. ?  ?SYRINGE-NEEDLE (DISP) 3 ML 25G X 5/8" 3 ML Misc ?Use as instructed with B12 injection. ?  ?vitamin E 180 MG (400 UNITS) capsule ?Take 400 Units by mouth in the morning. ?  ? ?  ? ? Follow-up Information   ? ? Loleta Dicker, PA Follow up in 2 week(s).   ?Why: for incision check. This appointment should be on your pre-op paperwork. Please call the office with questions or concerns regarding appointment date or time. ?Contact information: ?[place] [address] ?[phone] ? ? ?  ?  ? ?  ?  ? ?  ? ? ?Signed: ?Loleta Dicker ?04/13/2021, 5:37 PM ? ? ?

## 2021-04-13 NOTE — Anesthesia Procedure Notes (Addendum)
Procedure Name: Intubation ?Date/Time: 04/13/2021 7:33 AM ?Performed by: Lowry Bowl, CRNA ?Pre-anesthesia Checklist: Patient identified, Emergency Drugs available, Suction available and Patient being monitored ?Patient Re-evaluated:Patient Re-evaluated prior to induction ?Oxygen Delivery Method: Circle system utilized ?Preoxygenation: Pre-oxygenation with 100% oxygen ?Induction Type: IV induction and Rapid sequence ?Ventilation: Mask ventilation without difficulty ?Laryngoscope Size: McGraph and 4 ?Grade View: Grade I ?Tube type: Oral ?Tube size: 7.0 mm ?Number of attempts: 1 ?Airway Equipment and Method: Stylet and Video-laryngoscopy ?Placement Confirmation: ETT inserted through vocal cords under direct vision, positive ETCO2 and breath sounds checked- equal and bilateral ?Secured at: 21 cm ?Tube secured with: Tape ?Dental Injury: Teeth and Oropharynx as per pre-operative assessment  ?Comments: Head and neck kept neutral and stable throughout intubation ? ? ? ? ?

## 2021-05-21 ENCOUNTER — Encounter: Payer: Self-pay | Admitting: Internal Medicine

## 2021-05-22 ENCOUNTER — Other Ambulatory Visit: Payer: Self-pay

## 2021-05-22 MED ORDER — ONETOUCH ULTRA VI STRP: ORAL_STRIP | 12 refills | Status: AC

## 2021-05-24 DIAGNOSIS — M4322 Fusion of spine, cervical region: Secondary | ICD-10-CM | POA: Diagnosis not present

## 2021-05-24 DIAGNOSIS — R590 Localized enlarged lymph nodes: Secondary | ICD-10-CM | POA: Diagnosis not present

## 2021-05-24 DIAGNOSIS — Z01818 Encounter for other preprocedural examination: Secondary | ICD-10-CM | POA: Diagnosis not present

## 2021-05-24 DIAGNOSIS — Z981 Arthrodesis status: Secondary | ICD-10-CM | POA: Diagnosis not present

## 2021-06-05 DIAGNOSIS — Z79899 Other long term (current) drug therapy: Secondary | ICD-10-CM | POA: Diagnosis not present

## 2021-06-05 DIAGNOSIS — L4050 Arthropathic psoriasis, unspecified: Secondary | ICD-10-CM | POA: Diagnosis not present

## 2021-07-06 ENCOUNTER — Ambulatory Visit (INDEPENDENT_AMBULATORY_CARE_PROVIDER_SITE_OTHER): Payer: Medicare Other

## 2021-07-06 DIAGNOSIS — I6523 Occlusion and stenosis of bilateral carotid arteries: Secondary | ICD-10-CM | POA: Diagnosis not present

## 2021-07-10 ENCOUNTER — Other Ambulatory Visit: Payer: Self-pay | Admitting: *Deleted

## 2021-07-10 DIAGNOSIS — I779 Disorder of arteries and arterioles, unspecified: Secondary | ICD-10-CM

## 2021-07-12 DIAGNOSIS — Z981 Arthrodesis status: Secondary | ICD-10-CM | POA: Diagnosis not present

## 2021-07-12 DIAGNOSIS — M4312 Spondylolisthesis, cervical region: Secondary | ICD-10-CM | POA: Diagnosis not present

## 2021-10-10 ENCOUNTER — Encounter: Payer: Self-pay | Admitting: Internal Medicine

## 2021-10-10 ENCOUNTER — Ambulatory Visit
Admission: RE | Admit: 2021-10-10 | Discharge: 2021-10-10 | Disposition: A | Discharge status: Home / Self Care | Payer: Medicare Other | Source: Ambulatory Visit | Attending: Internal Medicine | Admitting: Internal Medicine

## 2021-10-10 ENCOUNTER — Telehealth (INDEPENDENT_AMBULATORY_CARE_PROVIDER_SITE_OTHER): Payer: Medicare Other | Admitting: Internal Medicine

## 2021-10-10 ENCOUNTER — Ambulatory Visit
Admission: RE | Admit: 2021-10-10 | Discharge: 2021-10-10 | Disposition: A | Discharge status: Home / Self Care | Payer: Medicare Other | Attending: Internal Medicine | Admitting: Internal Medicine

## 2021-10-10 VITALS — BP 174/99 | Temp 100.4°F | Wt 180.0 lb

## 2021-10-10 DIAGNOSIS — R509 Fever, unspecified: Secondary | ICD-10-CM | POA: Diagnosis not present

## 2021-10-10 DIAGNOSIS — Z9189 Other specified personal risk factors, not elsewhere classified: Secondary | ICD-10-CM | POA: Diagnosis not present

## 2021-10-10 DIAGNOSIS — R051 Acute cough: Secondary | ICD-10-CM | POA: Insufficient documentation

## 2021-10-10 DIAGNOSIS — K449 Diaphragmatic hernia without obstruction or gangrene: Secondary | ICD-10-CM | POA: Diagnosis not present

## 2021-10-10 DIAGNOSIS — I10 Essential (primary) hypertension: Secondary | ICD-10-CM | POA: Diagnosis not present

## 2021-10-10 DIAGNOSIS — R350 Frequency of micturition: Secondary | ICD-10-CM

## 2021-10-10 DIAGNOSIS — R5383 Other fatigue: Secondary | ICD-10-CM | POA: Diagnosis not present

## 2021-10-10 DIAGNOSIS — R059 Cough, unspecified: Secondary | ICD-10-CM | POA: Diagnosis not present

## 2021-10-10 LAB — POCT INFLUENZA A/B
Influenza A, POC: NEGATIVE
Influenza B, POC: NEGATIVE

## 2021-10-10 MED ORDER — HYDROCOD POLI-CHLORPHE POLI ER 10-8 MG/5ML PO SUER: 5.0000 mL | Freq: Every evening | ORAL | 0 refills | Status: DC | PRN

## 2021-10-10 MED ORDER — DM-GUAIFENESIN ER 60-1200 MG PO TB12: 1.0000 | ORAL_TABLET | Freq: Two times a day (BID) | ORAL | 0 refills | Status: DC

## 2021-10-10 MED ORDER — MOLNUPIRAVIR EUA 200MG CAPSULE: 4.0000 | ORAL_CAPSULE | Freq: Two times a day (BID) | ORAL | 0 refills | Status: AC

## 2021-10-10 NOTE — Progress Notes (Signed)
Telephone Note  I connected with Shane Mcknight   on 10/10/21 at  2:40 PM EDT by a telephone application and verified that I am speaking with the correct person using two identifiers.  Location patient: Shane Mcknight Location provider:work or home office Persons participating in the virtual visit: patient, provider  I discussed the limitations and requested verbal permission for telemedicine visit. The patient expressed understanding and agreed to proceed.   HPI:  Acute telemedicine visit for : Sick wife + covid yesterday had cough days before, cough with pnd, sinus pain and fatigue, fever 100.4 and then 100.1. tried daytime cough and chest congestion [employer] delsym and another cough syrup qhs with decongestant  Tried Nasal saline,   Htn elevated w/o sx's though trending down after meds on atenolol 50 mg bid, losartan 25 1/2 pill   -Pertinent past medical history: see below -Pertinent medication allergies: Allergies  Allergen Reactions   Ephedrine Other (See Comments)    Causing prostate to hurt. Using for sinus bronchodilators Pain in groin, prostate   Nsaids Other (See Comments)    GI upset, history of esophagitis and Mallory-Weis tear   Other Rash, Other (See Comments) and Nausea Only    Paper tape causes rash Plastic tape is okay GI upset, history of esophagitis and Mallory-Weis tear   Vancomycin Itching and Rash    Same as pcn with itching   Amoxicillin Itching and Rash   Hyoscyamine Sulfate Other (See Comments)    Urinary retention Urine retention   Levsin [Hyoscyamine Sulfate] Other (See Comments)    Urinary retention   Naproxen Other (See Comments)    GI upset Also happens with other NSAIDS   Penicillins Itching and Rash    TOLERATED CEFAZOLIN Has patient had a PCN reaction causing immediate rash, facial/tongue/throat swelling, SOB or lightheadedness with hypotension: yes Has patient had a PCN reaction causing severe rash involving mucus membranes or skin necrosis:  no Has patient had a PCN reaction that required hospitalization: no Has patient had a PCN reaction occurring within the last 10 years: no If all of the above answers are "NO", then may proceed with Cephalosporin use.    Shellfish Allergy Itching, Nausea Only and Rash    Ingested shellfish cCuses rash and itching along with stomach sickness. Pt tolerates betadine    Sudafed [Pseudoephedrine Hcl] Other (See Comments)    Constricts prostate flow (from ephedrine d/t sinus meds)   -COVID-19 vaccine status:  Immunization History  Administered Date(s) Administered   Janssen (J&J) SARS-COV-2 Vaccination 05/25/2019   Moderna Sars-Covid-2 Vaccination 11/08/2019   PNEUMOCOCCAL CONJUGATE-20 05/12/2020   Pneumococcal Polysaccharide-23 11/22/2016   Td 11/22/2016     ROS: See pertinent positives and negatives per HPI.  Past Medical History:  Diagnosis Date   Allergic state    Anemia    Aortic atherosclerosis (HCC)    Arthritis    spine, hands, shoulder with previous cuff tear   B12 deficiency    CAD (coronary artery disease)    a.) 11/2015 CT: Coronary Ca2+ noted. b.) LHC 06/14/2016: 60% pRCA, 60% RPLB-2, 99% OM1, 50% mLAD, 40% oLM-LM, 30% pLAD, 60% oD1-D1, 60% oLCx, 95% dRCA; PCI placing a 2.5 x 76m Resolute Onyx DES dRCA. c.) LHC 09/14/2018: 40% oLM-LM, 50% mLAD, 30% pLAD, 60% oD1-D1, 40% oLCx, 100% OM1, 60% p-mRCA, 50% RPAV; dRCA stent widely patent; medical mgmt.   Candidiasis of esophagus (HCC)    Carotid artery stenosis 04/17/2020   a.) Carotid doppler 026/33/3545 462-56%RICA/LICA stenosis.  Colon polyp    DDD (degenerative disc disease), lumbosacral    Diastolic dysfunction 76/19/5093   a.)  TTE 04/06/2020; EF 55-60%; mild LA dilation; no RWMAs; G2DD.   Dysphagia    Effort angina (HCC)    Elevated PSA    Elevated transaminase level    Esophageal stricture    a.) s/p multiple dilations   Esophagitis, [place] grade D    Fatty liver    Ganglion cyst of finger of right hand     GERD (gastroesophageal reflux disease)    Guaiac positive stools    Hiatal hernia    HNP (herniated nucleus pulposus), lumbar    Hypercholesterolemia    Hypertension    Long term current use of immunosuppressive drug    a.) MTX + secukinumab for severe psoriatic arthritis   Lumbar radiculitis    Mallory-Weiss tear 11/2015   Migraine aura without headache    OSA (obstructive sleep apnea)    a.) does not use nocturnal PAP therapy   Psoriasis    Psoriatic arthritis ([place])    a.) severe; Tx'd with MTX + secukinumab   PVC (premature ventricular contraction)    Schatzki's ring    Type II diabetes mellitus ([place])     Past Surgical History:  Procedure Laterality Date   ANTERIOR CERVICAL DECOMP/DISCECTOMY FUSION N/A 04/13/2021   Procedure: C3-5 ANTERIOR CERVICAL DISCECTOMY AND FUSION (GLOBUS ALLOGRAFT);  Surgeon: Meade Maw, MD;  Location: [facility] ORS;  Service: Neurosurgery;  Laterality: N/A;   ARTHROPLASTY     right thumb; left thumb with 2 screws and plastic joint   BALLOON DILATION N/A 05/29/2015   Procedure: BALLOON DILATION;  Surgeon: Lollie Sails, MD;  Location: [facility] ENDOSCOPY;  Service: Endoscopy;  Laterality: N/A;   BALLOON DILATION N/A 02/14/2017   Procedure: BALLOON DILATION;  Surgeon: Lollie Sails, MD;  Location: [facility] ENDOSCOPY;  Service: Endoscopy;  Laterality: N/A;   BILATERAL CARPAL TUNNEL RELEASE Bilateral 07/04/2017   Procedure: BILATERAL CARPAL TUNNEL RELEASE;  Surgeon: Leanor Kail, MD;  Location: [facility] ORS;  Service: Orthopedics;  Laterality: Bilateral;   BLEPHAROPLASTY     BUNIONECTOMY Left 12/2020   CARDIAC CATHETERIZATION     1 stent    CARPAL TUNNEL RELEASE  2005   right   CHOLECYSTECTOMY  2008   COLONOSCOPY     COLONOSCOPY WITH PROPOFOL N/A 02/13/2018   Procedure: COLONOSCOPY WITH PROPOFOL;  Surgeon: Lollie Sails, MD;  Location: [facility] ENDOSCOPY;  Service: Endoscopy;  Laterality: N/A;   CORONARY STENT INTERVENTION N/A 06/14/2016    Procedure: Coronary Stent Intervention;  Surgeon: Wellington Hampshire, MD;  Location: [place] CV LAB;  Service: Cardiovascular;  Laterality: N/A;   CYST EXCISION  04/14/2015   tendon sheath cyst excision; right ring finger cyst removed and trigger finger realease   ESOPHAGOGASTRODUODENOSCOPY     ESOPHAGOGASTRODUODENOSCOPY (EGD) WITH PROPOFOL N/A 05/29/2015   Procedure: ESOPHAGOGASTRODUODENOSCOPY (EGD) WITH PROPOFOL;  Surgeon: Lollie Sails, MD;  Location: [facility] ENDOSCOPY;  Service: Endoscopy;  Laterality: N/A;   ESOPHAGOGASTRODUODENOSCOPY (EGD) WITH PROPOFOL N/A 07/04/2015   Procedure: ESOPHAGOGASTRODUODENOSCOPY (EGD) WITH PROPOFOL;  Surgeon: Lollie Sails, MD;  Location: [facility] ENDOSCOPY;  Service: Endoscopy;  Laterality: N/A;   ESOPHAGOGASTRODUODENOSCOPY (EGD) WITH PROPOFOL N/A 01/04/2016   Procedure: ESOPHAGOGASTRODUODENOSCOPY (EGD) WITH PROPOFOL;  Surgeon: Lollie Sails, MD;  Location: [facility] ENDOSCOPY;  Service: Endoscopy;  Laterality: N/A;   ESOPHAGOGASTRODUODENOSCOPY (EGD) WITH PROPOFOL N/A 03/22/2016   Procedure: ESOPHAGOGASTRODUODENOSCOPY (EGD) WITH PROPOFOL;  Surgeon: Lollie Sails, MD;  Location: [facility] ENDOSCOPY;  Service: Endoscopy;  Laterality: N/A;   ESOPHAGOGASTRODUODENOSCOPY (EGD) WITH PROPOFOL N/A 02/14/2017   Procedure: ESOPHAGOGASTRODUODENOSCOPY (EGD) WITH PROPOFOL;  Surgeon: Lollie Sails, MD;  Location: [facility] ENDOSCOPY;  Service: Endoscopy;  Laterality: N/A;   ESOPHAGOGASTRODUODENOSCOPY (EGD) WITH PROPOFOL N/A 04/11/2017   Procedure: ESOPHAGOGASTRODUODENOSCOPY (EGD) WITH PROPOFOL;  Surgeon: Lollie Sails, MD;  Location: [facility] ENDOSCOPY;  Service: Endoscopy;  Laterality: N/A;   ESOPHAGOGASTRODUODENOSCOPY (EGD) WITH PROPOFOL N/A 07/07/2017   Procedure: ESOPHAGOGASTRODUODENOSCOPY (EGD) WITH PROPOFOL;  Surgeon: Lucilla Lame, MD;  Location: [facility] ENDOSCOPY;  Service: Endoscopy;  Laterality: N/A;   ESOPHAGOGASTRODUODENOSCOPY (EGD) WITH PROPOFOL N/A 02/13/2018    Procedure: ESOPHAGOGASTRODUODENOSCOPY (EGD) WITH PROPOFOL;  Surgeon: Lollie Sails, MD;  Location: [facility] ENDOSCOPY;  Service: Endoscopy;  Laterality: N/A;   FOREIGN BODY REMOVAL N/A 12/03/2015   Procedure: FOREIGN BODY REMOVAL;  Surgeon: Wilford Corner, MD;  Location: [facility] ENDOSCOPY;  Service: Endoscopy;  Laterality: N/A;   HERNIA REPAIR Right 1970   inguinal   INGUINAL HERNIA REPAIR Left 01/01/2018   Procedure: OPEN HERNIA REPAIR INGUINAL ADULT;  Surgeon: Olean Ree, MD;  Location: [facility] ORS;  Service: General;  Laterality: Left;   LEFT HEART CATH Left 06/14/2016   Procedure: Left Heart Cath;  Surgeon: Wellington Hampshire, MD;  Location: [place] CV LAB;  Service: Cardiovascular;  Laterality: Left;   LEFT HEART CATH AND CORONARY ANGIOGRAPHY Left 09/14/2018   Procedure: LEFT HEART CATH AND CORONARY ANGIOGRAPHY;  Surgeon: Wellington Hampshire, MD;  Location: [place] CV LAB;  Service: Cardiovascular;  Laterality: Left;   LUMBAR [place] SURGERY  2004   injections; herniated disc; percutaneous discectomy   NASAL SINUS SURGERY  2008   ROTATOR CUFF REPAIR Right    TONSILLECTOMY       Current Outpatient Medications:    acetaminophen (TYLENOL) 500 MG tablet, Take 1,000 mg by mouth daily as needed for moderate pain., Disp: , Rfl:    aspirin EC 81 MG tablet, Take 81 mg by mouth every evening. , Disp: , Rfl:    atenolol (TENORMIN) 50 MG tablet, TAKE 1 TABLET BY MOUTH  TWICE DAILY, Disp: 180 tablet, Rfl: 3   chlorpheniramine-HYDROcodone (TUSSIONEX) 10-8 MG/5ML, Take 5 mLs by mouth at bedtime as needed for cough., Disp: 115 mL, Rfl: 0   Dextromethorphan-Guaifenesin 60-1200 MG 12hr tablet, Take 1 tablet by mouth every 12 (twelve) hours. For HTN or coricidan for Htn, Disp: 60 tablet, Rfl: 0   folic acid (FOLVITE) 1 MG tablet, Take 1 mg by mouth daily., Disp: , Rfl:    glucose blood (ONETOUCH ULTRA) test strip, Used to check blood glucose one time per day., Disp: 100 each, Rfl: 12   isosorbide  mononitrate (IMDUR) 30 MG 24 hr tablet, Take 0.5 tablets (15 mg total) by mouth daily. (Patient taking differently: Take 15 mg by mouth every morning.), Disp: 45 tablet, Rfl: 1   losartan (COZAAR) 25 MG tablet, TAKE ONE-HALF TABLET BY  MOUTH DAILY (Patient taking differently: Take 12.5 mg by mouth every morning.), Disp: 45 tablet, Rfl: 3   melatonin 5 MG TABS, Take 5 mg by mouth at bedtime as needed., Disp: , Rfl:    metFORMIN (GLUCOPHAGE) 500 MG tablet, TAKE 2 TABLETS BY MOUTH  TWICE DAILY WITH A MEAL, Disp: 360 tablet, Rfl: 3   methotrexate (RHEUMATREX) 2.5 MG tablet, Take 8 tablets (20 mg total) by mouth once a week. Take 8 tablets every Friday., Disp: 8 tablet, Rfl: 0   molnupiravir EUA (LAGEVRIO) 200 mg  CAPS capsule, Take 4 capsules (800 mg total) by mouth 2 (two) times daily for 5 days., Disp: 40 capsule, Rfl: 0   nitroGLYCERIN (NITROSTAT) 0.4 MG SL tablet, Place 1 tablet (0.4 mg total) under the tongue every 5 (five) minutes as needed. MAXIMUM OF 3 DOSES., Disp: 25 tablet, Rfl: 6   pantoprazole (PROTONIX) 40 MG tablet, TAKE 1 TABLET BY MOUTH  TWICE DAILY, Disp: 180 tablet, Rfl: 3   rosuvastatin (CRESTOR) 10 MG tablet, TAKE 1 TABLET BY MOUTH  DAILY (Patient taking differently: Take 10 mg by mouth at bedtime.), Disp: 90 tablet, Rfl: 3   sodium chloride (OCEAN) 0.65 % SOLN nasal spray, Place 1 spray into both nostrils as needed for congestion., Disp: , Rfl:    SYRINGE-NEEDLE, DISP, 3 ML 25G X 5/8" 3 ML MISC, Use as instructed with B12 injection., Disp: 50 each, Rfl: 11   vitamin E 400 UNIT capsule, Take 400 Units by mouth in the morning., Disp: , Rfl:    cyanocobalamin (,VITAMIN B-12,) 1000 MCG/ML injection, Inject 1 mL (1,000 mcg total) into the muscle every 30 (thirty) days. (Patient not taking: Reported on 10/10/2021), Disp: 10 mL, Rfl: 1   gabapentin (NEURONTIN) 300 MG capsule, TAKE 1 TO 2 CAPSULES BY  MOUTH AT BEDTIME (Patient not taking: Reported on 10/10/2021), Disp: 180 capsule, Rfl: 3    methocarbamol (ROBAXIN) 500 MG tablet, Take 1 tablet (500 mg total) by mouth every 6 (six) hours as needed for muscle spasms. (Patient not taking: Reported on 10/10/2021), Disp: 120 tablet, Rfl: 0   PROPYLENE GLYCOL-GLYCERIN OP, Apply 2 drops to eye as needed. (Patient not taking: Reported on 10/10/2021), Disp: , Rfl:    senna (SENOKOT) 8.6 MG TABS tablet, Take 1 tablet (8.6 mg total) by mouth 2 (two) times daily. (Patient not taking: Reported on 10/10/2021), Disp: 120 tablet, Rfl: 0  EXAM:  VITALS per patient if applicable:  GENERAL: alert, oriented, appears well and in no acute distress  Lungs: dry cough on exam, no evidence of sob   PSYCH/NEURO: pleasant and cooperative, no obvious depression or anxiety, speech and thought processing grossly intact  ASSESSMENT AND PLAN:  Discussed the following assessment and plan:  At increased risk of exposure to COVID-19 virus wife + currently at home  flu negative cxr negative except small hiatal hernia +fever, fatigue, cough- Plan: Novel Coronavirus, NAA ([facility]), molnupiravir EUA (LAGEVRIO) 400 mg CAPS capsule bid x 5 days  Acute cough - Plan: POCT Influenza A/B, Novel Coronavirus, NAA ([facility]), DG Chest 2 View, Novel Coronavirus, NAA ([facility]), POCT Influenza A/B, Dextromethorphan-Guaifenesin 60-1200 MG 12hr tablet bid prn for HTN formulation, chlorpheniramine-HYDROcodone (TUSSIONEX) 10-8 MG/5ML Supportive care  Stop orc decongestants as BP elevated   Chronic urinary freq  Referred to [place] [facility] Dr. Diamantina Providence   -we discussed possible serious and likely etiologies, options for evaluation and workup, limitations of telemedicine visit vs in person visit, treatment, treatment risks and precautions. Pt is agreeable to treatment via telemedicine at this moment.    I discussed the assessment and treatment plan with the patient. The patient was provided an opportunity to ask questions and all were answered. The patient agreed with the plan and  demonstrated an understanding of the instructions.    Time spent 15 minutes Delorise Jackson, MD

## 2021-10-10 NOTE — Patient Instructions (Signed)
If needing prescription strength medication we will need to make an appointment with a provider.  These are over the counter medication options:  Mucinex dm green label for cough or robitussin DM  Multivitamin or below vitamins  Vitamin C 1000 mg daily.  Vitamin D3 4000 Iu (units) daily.  Zinc 100 mg daily.  Quercetin 250-500 mg 2 times per day   Elderberry  Oil of oregano  cepacol or chloroseptic spray Warm salt water gargles +hydrogen peroxide Sugar free cough drops  Warm tea with honey and lemon  Hydration  Try to eat though you dont feel like it   Tylenol or Advil  Nasal saline and Flonase 2 sprays nasal congestion  If sneezing/runny nose over the counter allergy pill claritin,allegra, zyrtec, xyzal Quarantine x 10-14 days 14 days preferred   Monitor pulse oximeter, buy from [facility] if oxygen is less than 90 please go to the hospital.        Are you feeling really sick? Shortness of breath, cough, chest pain?, dizziness? Confusion   If so let me know  If worsening, go to hospital or [facility] clinic Urgent care for further treatment.

## 2021-10-11 ENCOUNTER — Encounter: Payer: Self-pay | Admitting: Internal Medicine

## 2021-10-11 LAB — NOVEL CORONAVIRUS, NAA: SARS-CoV-2, NAA: DETECTED — AB

## 2021-10-13 ENCOUNTER — Other Ambulatory Visit: Payer: Self-pay | Admitting: Internal Medicine

## 2021-11-01 ENCOUNTER — Encounter: Payer: Self-pay | Admitting: Internal Medicine

## 2021-11-01 ENCOUNTER — Other Ambulatory Visit: Payer: Self-pay

## 2021-11-01 DIAGNOSIS — N183 Chronic kidney disease, stage 3 unspecified: Secondary | ICD-10-CM

## 2021-11-01 DIAGNOSIS — R5383 Other fatigue: Secondary | ICD-10-CM

## 2021-11-01 DIAGNOSIS — Z125 Encounter for screening for malignant neoplasm of prostate: Secondary | ICD-10-CM

## 2021-11-01 DIAGNOSIS — D649 Anemia, unspecified: Secondary | ICD-10-CM

## 2021-11-01 DIAGNOSIS — I10 Essential (primary) hypertension: Secondary | ICD-10-CM

## 2021-11-01 DIAGNOSIS — E785 Hyperlipidemia, unspecified: Secondary | ICD-10-CM

## 2021-11-05 ENCOUNTER — Other Ambulatory Visit (INDEPENDENT_AMBULATORY_CARE_PROVIDER_SITE_OTHER): Payer: Medicare Other

## 2021-11-05 DIAGNOSIS — I10 Essential (primary) hypertension: Secondary | ICD-10-CM

## 2021-11-05 DIAGNOSIS — N183 Chronic kidney disease, stage 3 unspecified: Secondary | ICD-10-CM

## 2021-11-05 DIAGNOSIS — Z125 Encounter for screening for malignant neoplasm of prostate: Secondary | ICD-10-CM

## 2021-11-05 DIAGNOSIS — D649 Anemia, unspecified: Secondary | ICD-10-CM | POA: Diagnosis not present

## 2021-11-05 DIAGNOSIS — E0822 Diabetes mellitus due to underlying condition with diabetic chronic kidney disease: Secondary | ICD-10-CM

## 2021-11-05 DIAGNOSIS — E785 Hyperlipidemia, unspecified: Secondary | ICD-10-CM

## 2021-11-05 DIAGNOSIS — R5383 Other fatigue: Secondary | ICD-10-CM

## 2021-11-05 LAB — HEPATIC FUNCTION PANEL
ALT: 13 U/L (ref 0–53)
AST: 16 U/L (ref 0–37)
Albumin: 4.5 g/dL (ref 3.5–5.2)
Alkaline Phosphatase: 57 U/L (ref 39–117)
Bilirubin, Direct: 0.1 mg/dL (ref 0.0–0.3)
Total Bilirubin: 0.5 mg/dL (ref 0.2–1.2)
Total Protein: 7.3 g/dL (ref 6.0–8.3)

## 2021-11-05 LAB — CBC WITH DIFFERENTIAL/PLATELET
Basophils Absolute: 0 10*3/uL (ref 0.0–0.1)
Basophils Relative: 0.5 % (ref 0.0–3.0)
Eosinophils Absolute: 0.1 10*3/uL (ref 0.0–0.7)
Eosinophils Relative: 2 % (ref 0.0–5.0)
HCT: 36.2 % — ABNORMAL LOW (ref 39.0–52.0)
Hemoglobin: 11.8 g/dL — ABNORMAL LOW (ref 13.0–17.0)
Lymphocytes Relative: 17.5 % (ref 12.0–46.0)
Lymphs Abs: 0.7 10*3/uL (ref 0.7–4.0)
MCHC: 32.6 g/dL (ref 30.0–36.0)
MCV: 82.3 fl (ref 78.0–100.0)
Monocytes Absolute: 0.4 10*3/uL (ref 0.1–1.0)
Monocytes Relative: 10.2 % (ref 3.0–12.0)
Neutro Abs: 2.9 10*3/uL (ref 1.4–7.7)
Neutrophils Relative %: 69.8 % (ref 43.0–77.0)
Platelets: 160 10*3/uL (ref 150.0–400.0)
RBC: 4.41 Mil/uL (ref 4.22–5.81)
RDW: 18.9 % — ABNORMAL HIGH (ref 11.5–15.5)
WBC: 4.1 10*3/uL (ref 4.0–10.5)

## 2021-11-05 LAB — TSH: TSH: 1.26 u[IU]/mL (ref 0.35–5.50)

## 2021-11-05 LAB — BASIC METABOLIC PANEL
BUN: 8 mg/dL (ref 6–23)
CO2: 28 mEq/L (ref 19–32)
Calcium: 9.3 mg/dL (ref 8.4–10.5)
Chloride: 103 mEq/L (ref 96–112)
Creatinine, Ser: 0.76 mg/dL (ref 0.40–1.50)
GFR: 89.43 mL/min (ref >60.00)
Glucose, Bld: 163 mg/dL — ABNORMAL HIGH (ref 70–99)
Potassium: 3.8 mEq/L (ref 3.5–5.1)
Sodium: 139 mEq/L (ref 135–145)

## 2021-11-05 LAB — PSA, MEDICARE: PSA: 2.77 ng/ml (ref 0.10–4.00)

## 2021-11-05 LAB — HEMOGLOBIN A1C: Hgb A1c MFr Bld: 7.6 % — ABNORMAL HIGH (ref 4.6–6.5)

## 2021-11-05 LAB — LIPID PANEL
Cholesterol: 134 mg/dL (ref 0–200)
HDL: 40.2 mg/dL (ref >39.00)
LDL Cholesterol: 77 mg/dL (ref 0–99)
NonHDL: 93.96
Total CHOL/HDL Ratio: 3
Triglycerides: 85 mg/dL (ref 0.0–149.0)
VLDL: 17 mg/dL (ref 0.0–40.0)

## 2021-11-06 ENCOUNTER — Encounter: Payer: Self-pay | Admitting: Internal Medicine

## 2021-11-06 ENCOUNTER — Ambulatory Visit (INDEPENDENT_AMBULATORY_CARE_PROVIDER_SITE_OTHER): Payer: Medicare Other | Admitting: Internal Medicine

## 2021-11-06 ENCOUNTER — Other Ambulatory Visit: Payer: Self-pay | Admitting: Internal Medicine

## 2021-11-06 ENCOUNTER — Other Ambulatory Visit (INDEPENDENT_AMBULATORY_CARE_PROVIDER_SITE_OTHER): Payer: Medicare Other

## 2021-11-06 VITALS — BP 138/60 | HR 74 | Temp 97.9°F | Ht 68.5 in | Wt 175.6 lb

## 2021-11-06 DIAGNOSIS — N183 Chronic kidney disease, stage 3 unspecified: Secondary | ICD-10-CM

## 2021-11-06 DIAGNOSIS — N5089 Other specified disorders of the male genital organs: Secondary | ICD-10-CM

## 2021-11-06 DIAGNOSIS — Z86010 Personal history of colonic polyps: Secondary | ICD-10-CM | POA: Diagnosis not present

## 2021-11-06 DIAGNOSIS — I5022 Chronic systolic (congestive) heart failure: Secondary | ICD-10-CM

## 2021-11-06 DIAGNOSIS — D649 Anemia, unspecified: Secondary | ICD-10-CM

## 2021-11-06 DIAGNOSIS — I25119 Atherosclerotic heart disease of native coronary artery with unspecified angina pectoris: Secondary | ICD-10-CM

## 2021-11-06 DIAGNOSIS — L4050 Arthropathic psoriasis, unspecified: Secondary | ICD-10-CM

## 2021-11-06 DIAGNOSIS — I10 Essential (primary) hypertension: Secondary | ICD-10-CM | POA: Diagnosis not present

## 2021-11-06 DIAGNOSIS — R1319 Other dysphagia: Secondary | ICD-10-CM

## 2021-11-06 DIAGNOSIS — R7989 Other specified abnormal findings of blood chemistry: Secondary | ICD-10-CM

## 2021-11-06 DIAGNOSIS — E0822 Diabetes mellitus due to underlying condition with diabetic chronic kidney disease: Secondary | ICD-10-CM

## 2021-11-06 DIAGNOSIS — I779 Disorder of arteries and arterioles, unspecified: Secondary | ICD-10-CM

## 2021-11-06 DIAGNOSIS — E785 Hyperlipidemia, unspecified: Secondary | ICD-10-CM

## 2021-11-06 DIAGNOSIS — K219 Gastro-esophageal reflux disease without esophagitis: Secondary | ICD-10-CM

## 2021-11-06 DIAGNOSIS — Z0000 Encounter for general adult medical examination without abnormal findings: Secondary | ICD-10-CM

## 2021-11-06 LAB — IBC + FERRITIN
Ferritin: 9.9 ng/mL — ABNORMAL LOW (ref 22.0–322.0)
Iron: 116 ug/dL (ref 42–165)
Saturation Ratios: 32.6 % (ref 20.0–50.0)
TIBC: 355.6 ug/dL (ref 250.0–450.0)
Transferrin: 254 mg/dL (ref 212.0–360.0)

## 2021-11-06 LAB — VITAMIN B12: Vitamin B-12: 206 pg/mL — ABNORMAL LOW (ref 211–911)

## 2021-11-06 MED ORDER — EMPAGLIFLOZIN 10 MG PO TABS: 10.0000 mg | ORAL_TABLET | Freq: Every day | ORAL | 2 refills | Status: DC

## 2021-11-06 NOTE — Assessment & Plan Note (Signed)
Physical today 11/06/21.  Colonoscopy 01/2018.

## 2021-11-06 NOTE — Progress Notes (Signed)
Orders placed for add on labs.  

## 2021-11-06 NOTE — Progress Notes (Signed)
Patient ID: Shane Mcknight, male   DOB: Jun 05, 1950, 71 y.o.   MRN: [id number]   Subjective:    Patient ID: Shane Mcknight, male    DOB: 27-Jan-1950, 71 y.o.   MRN: [id number]   Patient here for  Chief Complaint  Patient presents with   Annual Exam    Pt would also like to discuss left testicle being large after hernia surgery and the fact his metformin is causing him indigestion.    Marland Kitchen   HPI Recent covid.  Symptoms resolved.  Reports metformin is causing indigestion.  Has noticed some dysphagia.  Spasm.  Discussed changing medication.  No chest pain or sob reported.  No cough or congestion.  No abdominal pain.  Concern since hernia surgery - one testicle larger.  No pain.     Past Medical History:  Diagnosis Date   Allergic state    Anemia    Aortic atherosclerosis (HCC)    Arthritis    spine, hands, shoulder with previous cuff tear   B12 deficiency    CAD (coronary artery disease)    a.) 11/2015 CT: Coronary Ca2+ noted. b.) LHC 06/14/2016: 60% pRCA, 60% RPLB-2, 99% OM1, 50% mLAD, 40% oLM-LM, 30% pLAD, 60% oD1-D1, 60% oLCx, 95% dRCA; PCI placing a 2.5 x 72m Resolute Onyx DES dRCA. c.) LHC 09/14/2018: 40% oLM-LM, 50% mLAD, 30% pLAD, 60% oD1-D1, 40% oLCx, 100% OM1, 60% p-mRCA, 50% RPAV; dRCA stent widely patent; medical mgmt.   Candidiasis of esophagus (HCC)    Carotid artery stenosis 04/17/2020   a.) Carotid doppler 071/06/2692 485-46%RICA/LICA stenosis.   Colon polyp    COVID-19    09/2021   DDD (degenerative disc disease), lumbosacral    Diastolic dysfunction 027/03/5007  a.)  TTE 04/06/2020; EF 55-60%; mild LA dilation; no RWMAs; G2DD.   Dysphagia    Effort angina    Elevated PSA    Elevated transaminase level    Esophageal stricture    a.) s/p multiple dilations   Esophagitis, [place] grade D    Fatty liver    Ganglion cyst of finger of right hand    GERD (gastroesophageal reflux disease)    Guaiac positive stools    Hiatal hernia    HNP (herniated nucleus pulposus),  lumbar    Hypercholesterolemia    Hypertension    Long term current use of immunosuppressive drug    a.) MTX + secukinumab for severe psoriatic arthritis   Lumbar radiculitis    Mallory-Weiss tear 11/2015   Migraine aura without headache    OSA (obstructive sleep apnea)    a.) does not use nocturnal PAP therapy   Psoriasis    Psoriatic arthritis ([place]    a.) severe; Tx'd with MTX + secukinumab   PVC (premature ventricular contraction)    Schatzki's ring    Type II diabetes mellitus ([place]    Past Surgical History:  Procedure Laterality Date   ANTERIOR CERVICAL DECOMP/DISCECTOMY FUSION N/A 04/13/2021   Procedure: C3-5 ANTERIOR CERVICAL DISCECTOMY AND FUSION (GLOBUS ALLOGRAFT);  Surgeon: YMeade Maw MD;  Location: [facility] ORS;  Service: Neurosurgery;  Laterality: N/A;   ARTHROPLASTY     right thumb; left thumb with 2 screws and plastic joint   BALLOON DILATION N/A 05/29/2015   Procedure: BALLOON DILATION;  Surgeon: MLollie Sails MD;  [facility];  Service: Endoscopy;  Laterality: N/A;   BALLOON DILATION N/A 02/14/2017   Procedure: BALLOON DILATION;  Surgeon: SLollie Sails MD;  [facility];  Service: Endoscopy;  Laterality: N/A;   BILATERAL CARPAL TUNNEL RELEASE Bilateral 07/04/2017   Procedure: BILATERAL CARPAL TUNNEL RELEASE;  Surgeon: Leanor Kail, MD;  Location: [facility] ORS;  Service: Orthopedics;  Laterality: Bilateral;   BLEPHAROPLASTY     BUNIONECTOMY Left 12/2020   CARDIAC CATHETERIZATION     1 stent    CARPAL TUNNEL RELEASE  2005   right   CHOLECYSTECTOMY  2008   COLONOSCOPY     COLONOSCOPY WITH PROPOFOL N/A 02/13/2018   Procedure: COLONOSCOPY WITH PROPOFOL;  Surgeon: Lollie Sails, MD;  Location: [facility] ENDOSCOPY;  Service: Endoscopy;  Laterality: N/A;   CORONARY STENT INTERVENTION N/A 06/14/2016   Procedure: Coronary Stent Intervention;  Surgeon: Wellington Hampshire, MD;  Location: [place] CV LAB;  Service:  Cardiovascular;  Laterality: N/A;   CYST EXCISION  04/14/2015   tendon sheath cyst excision; right ring finger cyst removed and trigger finger realease   ESOPHAGOGASTRODUODENOSCOPY     ESOPHAGOGASTRODUODENOSCOPY (EGD) WITH PROPOFOL N/A 05/29/2015   Procedure: ESOPHAGOGASTRODUODENOSCOPY (EGD) WITH PROPOFOL;  Surgeon: Lollie Sails, MD;  Location: [facility] ENDOSCOPY;  Service: Endoscopy;  Laterality: N/A;   ESOPHAGOGASTRODUODENOSCOPY (EGD) WITH PROPOFOL N/A 07/04/2015   Procedure: ESOPHAGOGASTRODUODENOSCOPY (EGD) WITH PROPOFOL;  Surgeon: Lollie Sails, MD;  [facility];  Service: Endoscopy;  Laterality: N/A;   ESOPHAGOGASTRODUODENOSCOPY (EGD) WITH PROPOFOL N/A 01/04/2016   Procedure: ESOPHAGOGASTRODUODENOSCOPY (EGD) WITH PROPOFOL;  Surgeon: Lollie Sails, MD;  Location: [facility] END[facility]COPY;  Service: Endoscopy;  Laterality: N/A;   ESOPHAGOGASTRODUODENOSCOPY (EGD) WITH PROPOFOL N/A 03/22/2016   Procedure: ESOPHAGOGASTRODUODENOSCOPY (EGD) WITH PROPOFOL;  Surgeon: Lollie Sails, MD;  Location: [facility] ENDOSCOPY;  Service: Endoscopy;  Laterality: N/A;   ESOPHAGOGASTRODUODENOSCOPY (EGD) WITH PROPOFOL N/A 02/14/2017   Procedure: ESOPHAGOGASTRODUODENOSCOPY (EGD) WITH PROPOFOL;  Surgeon: Lollie Sails, MD;  Location: [facility] ENDOSCOPY;  Service: Endoscopy;  Laterality: N/A;   ESOPHAGOGASTRODUODENOSCOPY (EGD) WITH PROPOFOL N/A 04/11/2017   Procedure: ESOPHAGOGASTRODUODENOSCOPY (EGD) WITH PROPOFOL;  Surgeon: Lollie Sails, MD;  Location: [facility] ENDOSCOPY;  Service: Endoscopy;  Laterality: N/A;   ESOPHAGOGASTRODUODENOSCOPY (EGD) WITH PROPOFOL N/A 07/07/2017   Procedure: ESOPHAGOGASTRODUODENOSCOPY (EGD) WITH PROPOFOL;  Surgeon: Lucilla Lame, MD;  Location: [facility] ENDOSCOPY;  Service: Endoscopy;  Laterality: N/A;   ESOPHAGOGASTRODUODENOSCOPY (EGD) WITH PROPOFOL N/A 02/13/2018   Procedure: ESOPHAGOGASTRODUODENOSCOPY (EGD) WITH PROPOFOL;  Surgeon: Lollie Sails, MD;  Location: [facility]  ENDOSCOPY;  Service: Endoscopy;  Laterality: N/A;   FOREIGN BODY REMOVAL N/A 12/03/2015   Procedure: FOREIGN BODY REMOVAL;  Surgeon: Wilford Corner, MD;  Location: [facility] ENDOSCOPY;  Service: Endoscopy;  Laterality: N/A;   HERNIA REPAIR Right 1970   inguinal   INGUINAL HERNIA REPAIR Left 01/01/2018   Procedure: OPEN HERNIA REPAIR INGUINAL ADULT;  Surgeon: Olean Ree, MD;  Location: [facility] ORS;  Service: General;  Laterality: Left;   LEFT HEART CATH Left 06/14/2016   Procedure: Left Heart Cath;  Surgeon: Wellington Hampshire, MD;  Location: [place] CV LAB;  Service: Cardiovascular;  Laterality: Left;   LEFT HEART CATH AND CORONARY ANGIOGRAPHY Left 09/14/2018   Procedure: LEFT HEART CATH AND CORONARY ANGIOGRAPHY;  Surgeon: Wellington Hampshire, MD;  Location: [place] CV LAB;  Service: Cardiovascular;  Laterality: Left;   LUMBAR DISC SURGERY  2004   injections; herniated disc; percutaneous discectomy   NASAL SINUS SURGERY  2008   ROTATOR CUFF REPAIR Right    TONSILLECTOMY     Family History  Problem Relation Age of Onset   Lung cancer Father    Cancer  Father    Hypertension Mother    Aneurysm Son    Social History   Socioeconomic History   Marital status: Married    Spouse name: Pamala Hurry   Number of children: Not on file   Years of education: Not on file   Highest education level: Not on file  Occupational History    Comment: not heavy work  Tobacco Use   Smoking status: Former    Packs/day: 0.25    Years: 7.00    Total pack years: 1.75    Types: Cigarettes    Quit date: 07/06/1982    Years since quitting: 39.3   Smokeless tobacco: Never  Vaping Use   Vaping Use: Never used  Substance and Sexual Activity   Alcohol use: Not Currently   Drug use: No   Sexual activity: Not on file  Other Topics Concern   Not on file  Social History Narrative   Lives locally with wife.  Active but does not routinely exercise.   Social Determinants of Health   Financial Resource  Strain: Low Risk  (02/15/2021)   Overall Financial Resource Strain (CARDIA)    Difficulty of Paying Living Expenses: Not hard at all  Food Insecurity: No Food Insecurity (02/15/2021)   Hunger Vital Sign    Worried About Running Out of Food in the Last Year: Never true    Ran Out of Food in the Last Year: Never true  Transportation Needs: No Transportation Needs (02/15/2021)   PRAPARE - Hydrologist (Medical): No    Lack of Transportation (Non-Medical): No  Physical Activity: Not on file  Stress: No Stress Concern Present (02/15/2021)   [place]    Feeling of Stress : Not at all  Social Connections: Unknown (02/15/2021)   Social Connection and Isolation Panel [NHANES]    Frequency of Communication with Friends and Family: Not on file    Frequency of Social Gatherings with Friends and Family: Not on file    Attends Religious Services: Not on file    Active Member of Clubs or Organizations: Not on file    Attends Archivist Meetings: Not on file    Marital Status: Married     Review of Systems  Constitutional:  Negative for appetite change and unexpected weight change.  HENT:  Negative for congestion, sinus pressure and sore throat.   Eyes:  Negative for pain and visual disturbance.  Respiratory:  Negative for cough, chest tightness and shortness of breath.   Cardiovascular:  Negative for chest pain, palpitations and leg swelling.  Gastrointestinal:  Negative for abdominal pain, diarrhea, nausea and vomiting.       Indigestion as outlined.  Dysphagia.    Genitourinary:  Negative for difficulty urinating and dysuria.  Musculoskeletal:  Negative for joint swelling and myalgias.  Skin:  Negative for color change and rash.  Neurological:  Negative for dizziness and headaches.  Hematological:  Negative for adenopathy. Does not bruise/bleed easily.  Psychiatric/Behavioral:  Negative for  agitation and dysphoric mood.        Objective:     BP 138/60 (BP Location: Left Arm, Patient Position: Sitting, Cuff Size: Normal)   Pulse 74   Temp 97.9 F (36.6 C) (Oral)   Ht 5' 8.5" (1.74 m)   Wt 175 lb 9.6 oz (79.7 kg)   SpO2 98%   BMI 26.31 kg/m  Wt Readings from Last 3 Encounters:  11/06/21 175 lb  9.6 oz (79.7 kg)  10/10/21 180 lb (81.6 kg)  04/13/21 180 lb (81.6 kg)    Physical Exam Constitutional:      General: He is not in acute distress.    Appearance: Normal appearance. He is well-developed.  HENT:     Head: Normocephalic and atraumatic.     Right Ear: External ear normal.     Left Ear: External ear normal.  Eyes:     General: No scleral icterus.       Right eye: No discharge.        Left eye: No discharge.     Conjunctiva/sclera: Conjunctivae normal.  Neck:     Thyroid: No thyromegaly.  Cardiovascular:     Rate and Rhythm: Normal rate and regular rhythm.  Pulmonary:     Effort: No respiratory distress.     Breath sounds: Normal breath sounds. No wheezing.  Abdominal:     General: Bowel sounds are normal.     Palpations: Abdomen is soft.     Tenderness: There is no abdominal tenderness.  Genitourinary:    Comments: No nodules - testicle.  Larger.  Musculoskeletal:        General: No swelling or tenderness.     Cervical back: Neck supple. No tenderness.  Lymphadenopathy:     Cervical: No cervical adenopathy.  Skin:    Findings: No erythema or rash.  Neurological:     Mental Status: He is alert and oriented to person, place, and time.  Psychiatric:        Mood and Affect: Mood normal.        Behavior: Behavior normal.      Outpatient Encounter Medications as of 11/06/2021  Medication Sig   acetaminophen (TYLENOL) 500 MG tablet Take 1,000 mg by mouth daily as needed for moderate pain.   aspirin EC 81 MG tablet Take 81 mg by mouth every evening.    atenolol (TENORMIN) 50 MG tablet TAKE 1 TABLET BY MOUTH  TWICE DAILY    chlorpheniramine-HYDROcodone (TUSSIONEX) 10-8 MG/5ML Take 5 mLs by mouth at bedtime as needed for cough.   Dextromethorphan-Guaifenesin 60-1200 MG 12hr tablet Take 1 tablet by mouth every 12 (twelve) hours. For HTN or coricidan for Htn   empagliflozin (JARDIANCE) 10 MG TABS tablet Take 1 tablet (10 mg total) by mouth daily before breakfast.   folic acid (FOLVITE) 1 MG tablet Take 1 mg by mouth daily.   gabapentin (NEURONTIN) 300 MG capsule TAKE 1 TO 2 CAPSULES BY  MOUTH AT BEDTIME (Patient taking differently: TAKE 1 TO 2 CAPSULES BY  MOUTH AT BEDTIME)   glucose blood (ONETOUCH ULTRA) test strip Used to check blood glucose one time per day.   isosorbide mononitrate (IMDUR) 30 MG 24 hr tablet Take 0.5 tablets (15 mg total) by mouth daily. (Patient taking differently: Take 15 mg by mouth every morning.)   losartan (COZAAR) 25 MG tablet TAKE ONE-HALF TABLET BY  MOUTH DAILY (Patient taking differently: Take 12.5 mg by mouth every morning.)   melatonin 5 MG TABS Take 5 mg by mouth at bedtime as needed.   methocarbamol (ROBAXIN) 500 MG tablet Take 1 tablet (500 mg total) by mouth every 6 (six) hours as needed for muscle spasms.   methotrexate (RHEUMATREX) 2.5 MG tablet Take 8 tablets (20 mg total) by mouth once a week. Take 8 tablets every Friday.   nitroGLYCERIN (NITROSTAT) 0.4 MG SL tablet Place 1 tablet (0.4 mg total) under the tongue every 5 (five) minutes as needed.  MAXIMUM OF 3 DOSES.   pantoprazole (PROTONIX) 40 MG tablet TAKE 1 TABLET BY MOUTH  TWICE DAILY   rosuvastatin (CRESTOR) 10 MG tablet TAKE 1 TABLET BY MOUTH  DAILY (Patient taking differently: Take 10 mg by mouth at bedtime.)   senna (SENOKOT) 8.6 MG TABS tablet Take 1 tablet (8.6 mg total) by mouth 2 (two) times daily.   sodium chloride (OCEAN) 0.65 % SOLN nasal spray Place 1 spray into both nostrils as needed for congestion.   SYRINGE-NEEDLE, DISP, 3 ML 25G X 5/8" 3 ML MISC Use as instructed with B12 injection.   vitamin E 400 UNIT  capsule Take 400 Units by mouth in the morning.   [DISCONTINUED] metFORMIN (GLUCOPHAGE) 500 MG tablet TAKE 2 TABLETS BY MOUTH  TWICE DAILY WITH A MEAL   [DISCONTINUED] cyanocobalamin (,VITAMIN B-12,) 1000 MCG/ML injection Inject 1 mL (1,000 mcg total) into the muscle every 30 (thirty) days. (Patient not taking: Reported on 10/10/2021)   [DISCONTINUED] PROPYLENE GLYCOL-GLYCERIN OP Apply 2 drops to eye as needed. (Patient not taking: Reported on 10/10/2021)   No facility-administered encounter medications on file as of 11/06/2021.     Lab Results  Component Value Date   WBC 4.1 11/05/2021   HGB 11.8 (L) 11/05/2021   HCT 36.2 (L) 11/05/2021   PLT 160.0 11/05/2021   GLUCOSE 163 (H) 11/05/2021   CHOL 134 11/05/2021   TRIG 85.0 11/05/2021   HDL 40.20 11/05/2021   LDLCALC 77 11/05/2021   ALT 13 11/05/2021   AST 16 11/05/2021   NA 139 11/05/2021   K 3.8 11/05/2021   CL 103 11/05/2021   CREATININE 0.76 11/05/2021   BUN 8 11/05/2021   CO2 28 11/05/2021   TSH 1.26 11/05/2021   PSA 2.77 11/05/2021   INR 1.1 06/07/2016   HGBA1C 7.6 (H) 11/05/2021   MICROALBUR 20.2 (H) 12/17/2019    DG Chest 2 View  Result Date: 10/10/2021 CLINICAL DATA:  Fever and cough EXAM: CHEST - 2 VIEW COMPARISON:  Chest x-ray 09/22/2018 FINDINGS: Small hiatal hernia is present. No focal lung infiltrate, pleural effusion or pneumothorax. Cardiomediastinal silhouette is within normal limits. No acute fractures. Cervical spinal fusion plate is present. IMPRESSION: 1. No active cardiopulmonary disease. 2. Small hiatal hernia. Electronically Signed   By: Ronney Asters M.D.   On: 10/10/2021 15:15       Assessment & Plan:   Problem List Items Addressed This Visit     Abnormal liver function tests    Follow liver function tests.       Anemia    Being followed by GI.  S/p recent EGD.  Colonoscopy 2020.  EGD 2021.  On protonix. Follow cbc.       Relevant Orders   CBC with Differential/Platelet   Ferritin   Carotid  artery disease ([place])    04/17/2020 carotid showed 40 to 59% narrowing of right and left carotid arteries.  Left carotid artery was unchanged; however, the right carotid artery has progressed.  Followed by cardiology.  Continue statin medication and risk factor modification.       Chronic systolic heart failure (HCC)    No evidence of volume overload on exam.  Continue imdur, beta blocker and losartan.  Stable.       Coronary artery disease with angina pectoris ([place])    09/14/2018 cardiac cath showed patent distal RCA stent with no significant restenosis. Stable moderate disease in the mid LAD and proximal right coronary artery.  OM1 now completely occluded.  It was 99% stenosed before but overall a small branch.  Per note, the only difference noted from most recent angiography was progression of distal RCA disease just proximal to the stent and a tortuous segment causing about 60% stenosis.  It was also noted that he had normal LV systolic function with mildly elevated left ventricular end-diastolic pressure.  recent chest pain. On imdur.  No further chest pain as outlined.  Tolerating 72m imdur.  Continue statin and risk factor modfication.        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  Dysphagia.  Feels related to metformin.  Will stop metformin.  Start jardiance 145mq day.  Follow met b and a1c.       Relevant Medications   empagliflozin (JARDIANCE) 10 MG TABS tablet   Other Relevant Orders   Hemoglobin A1c   Dysphagia    Dysphagia as outlined.  Indigestion.  Feels related to metformin.  Will stop metformin.  Discussed further evaluation.  Call with update after stopping metformin.       Enlarged testicle    Refer to urology.       Relevant Orders   Ambulatory referral to Urology   GERD (gastroesophageal reflux disease)    Continue protonix.  Acid reflux appears  to be controlled.       Health care maintenance    Physical today 11/06/21.  Colonoscopy 01/2018.       History of colonic polyps    Colonoscopy 01/2018 - diverticulosis and internal hemorrhoids.       Hyperlipidemia    Continue crestor.  Low cholesterol diet and exercise.  Follow lipid panel and liver function tests.        Relevant Orders   Hepatic function panel   Lipid panel   Hypertension    Continue losartan, atenolol and imdur.  Follow pressures.  Follow metabolic panel.       Relevant Orders   Basic metabolic panel   Psoriatic arthritis ([place]   Followed by Dr KeJefm Bryant Consentyx/MTX/gabapentin.  Stable.       Other Visit Diagnoses     Routine general medical examination at a health care facility    -  Primary        ChEinar PheasantMD

## 2021-11-09 ENCOUNTER — Encounter: Payer: Self-pay | Admitting: Internal Medicine

## 2021-11-09 NOTE — Telephone Encounter (Signed)
Please call and confirm he is doing ok and tolerating medication.  I cannot tell from the message if he is having problems.  Remain off metformin given itching is better.

## 2021-11-11 ENCOUNTER — Encounter: Payer: Self-pay | Admitting: Internal Medicine

## 2021-11-11 DIAGNOSIS — N5089 Other specified disorders of the male genital organs: Secondary | ICD-10-CM | POA: Insufficient documentation

## 2021-11-11 NOTE — Assessment & Plan Note (Signed)
Being followed by GI.  S/p recent EGD.  Colonoscopy 2020.  EGD 2021.  On protonix. Follow cbc.

## 2021-11-11 NOTE — Assessment & Plan Note (Signed)
09/14/2018 cardiac cath showed patent distal RCA stent with no significant restenosis. Stable moderate disease in the mid LAD and proximal right coronary artery. OM1 now completely occluded. It was 99% stenosed before but overall a small branch. Per note, the only difference noted from most recent angiography was progression of distal RCA disease just proximal to the stent and a tortuous segment causing about 60% stenosis. It was also noted that he had normal LV systolic function with mildly elevated left ventricular end-diastolic pressure. recent chest pain. On imdur.  No further chest pain as outlined.  Tolerating '30mg'$  imdur.  Continue statin and risk factor modfication.

## 2021-11-11 NOTE — Assessment & Plan Note (Signed)
Followed by Dr Jefm Bryant.  Consentyx/MTX/gabapentin.  Stable.

## 2021-11-11 NOTE — Assessment & Plan Note (Signed)
Colonoscopy 01/2018 - diverticulosis and internal hemorrhoids.

## 2021-11-11 NOTE — Assessment & Plan Note (Signed)
Follow liver function tests.   

## 2021-11-11 NOTE — Assessment & Plan Note (Signed)
04/17/2020 carotid showed 40 to 59% narrowing of right and left carotid arteries.  Left carotid artery was unchanged; however, the right carotid artery has progressed.  Followed by cardiology.  Continue statin medication and risk factor modification.

## 2021-11-11 NOTE — Assessment & Plan Note (Signed)
Continue losartan, atenolol and imdur.  Follow pressures.  Follow metabolic panel.

## 2021-11-11 NOTE — Assessment & Plan Note (Signed)
Refer to urology.  ?

## 2021-11-11 NOTE — Assessment & Plan Note (Signed)
Low carb diet and exercise.  Dysphagia.  Feels related to metformin.  Will stop metformin.  Start jardiance 25m q day.  Follow met b and a1c.

## 2021-11-11 NOTE — Assessment & Plan Note (Signed)
No evidence of volume overload on exam.  Continue imdur, beta blocker and losartan.  Stable.

## 2021-11-11 NOTE — Assessment & Plan Note (Signed)
Continue crestor.  Low cholesterol diet and exercise. Follow lipid panel and liver function tests.   

## 2021-11-11 NOTE — Assessment & Plan Note (Signed)
Continue protonix.  Acid reflux appears to be controlled.

## 2021-11-11 NOTE — Assessment & Plan Note (Signed)
Dysphagia as outlined.  Indigestion.  Feels related to metformin.  Will stop metformin.  Discussed further evaluation.  Call with update after stopping metformin.

## 2021-11-13 ENCOUNTER — Other Ambulatory Visit
Admission: RE | Admit: 2021-11-13 | Discharge: 2021-11-13 | Disposition: A | Discharge status: Home / Self Care | Payer: Medicare Other | Attending: Urology | Admitting: Urology

## 2021-11-13 ENCOUNTER — Other Ambulatory Visit: Payer: Self-pay | Admitting: *Deleted

## 2021-11-13 ENCOUNTER — Ambulatory Visit: Payer: Medicare Other | Admitting: Urology

## 2021-11-13 ENCOUNTER — Other Ambulatory Visit: Payer: Self-pay

## 2021-11-13 ENCOUNTER — Encounter: Payer: Self-pay | Admitting: Internal Medicine

## 2021-11-13 ENCOUNTER — Encounter: Payer: Self-pay | Admitting: Urology

## 2021-11-13 VITALS — BP 133/80 | HR 74 | Ht 68.5 in | Wt 176.0 lb

## 2021-11-13 DIAGNOSIS — R3915 Urgency of urination: Secondary | ICD-10-CM | POA: Diagnosis not present

## 2021-11-13 DIAGNOSIS — N401 Enlarged prostate with lower urinary tract symptoms: Secondary | ICD-10-CM

## 2021-11-13 DIAGNOSIS — N5089 Other specified disorders of the male genital organs: Secondary | ICD-10-CM

## 2021-11-13 DIAGNOSIS — N138 Other obstructive and reflux uropathy: Secondary | ICD-10-CM

## 2021-11-13 DIAGNOSIS — R350 Frequency of micturition: Secondary | ICD-10-CM | POA: Insufficient documentation

## 2021-11-13 DIAGNOSIS — D649 Anemia, unspecified: Secondary | ICD-10-CM

## 2021-11-13 DIAGNOSIS — Z125 Encounter for screening for malignant neoplasm of prostate: Secondary | ICD-10-CM

## 2021-11-13 LAB — URINALYSIS, COMPLETE (UACMP) WITH MICROSCOPIC
Bacteria, UA: NONE SEEN
Bilirubin Urine: NEGATIVE
Glucose, UA: >1000 mg/dL — AB
Hgb urine dipstick: NEGATIVE
Ketones, ur: NEGATIVE mg/dL
Leukocytes,Ua: NEGATIVE
Nitrite: NEGATIVE
Protein, ur: NEGATIVE mg/dL
Specific Gravity, Urine: 1.01 (ref 1.005–1.030)
Squamous Epithelial / HPF: NONE SEEN (ref 0–5)
WBC, UA: NONE SEEN WBC/hpf (ref 0–5)
pH: 5.5 (ref 5.0–8.0)

## 2021-11-13 MED ORDER — TAMSULOSIN HCL 0.4 MG PO CAPS: 0.4000 mg | ORAL_CAPSULE | Freq: Every day | ORAL | 11 refills | Status: DC

## 2021-11-13 NOTE — Progress Notes (Signed)
   11/13/2021 3:57 PM   Shane Mcknight 1950/01/30 [id number]  Reason for visit: Left scrotal swelling, urinary symptoms  HPI: 71 year old male here today for further evaluation of scrotal swelling and urinary symptoms.  He was previously seen by Dr. Matilde Sprang in June 2022 for some mild urinary frequency and opted for observation alone.  Urinalysis was benign at that visit and PVR was normal at 10 mL.  He reports persistent left scrotal swelling since a hernia repair 2 years ago.  This is not painful or bothersome to him.  On exam he has some mild to moderate left scrotal swelling most consistent with a hydrocele, no testicular masses, nontender.  He is content with observation.  Could consider a follow-up scrotal ultrasound in the future if any changes or pain.  Recent PSA was normal at 2.73, DRE benign last year with Dr. Matilde Sprang.  He also reports some exacerbation of his urinary symptoms since starting Jardiance.  Primary issue is frequency every 1.5 to 2 hours during the day, and some urgency.  Urinalysis today is benign aside from >1000 glucose.  We discussed the concept of glucosuria which is likely exacerbating his baseline frequency.  We reviewed other possible etiologies including BPH or overactive bladder.  He is at the point where he is interested in a trial of medication, and I recommended Flomax 0.'4mg'$  nightly, risk and benefits were discussed.  Trial of Flomax 0.4 mg nightly for BPH Behavioral strategies discussed regarding urinary symptoms RTC 4 to 6 weeks PVR and symptom check  Billey Co, MD  [place] [address], [place], [place] [address] [phone]

## 2021-11-13 NOTE — Patient Instructions (Signed)

## 2021-11-16 ENCOUNTER — Other Ambulatory Visit: Payer: Self-pay

## 2021-11-16 DIAGNOSIS — D649 Anemia, unspecified: Secondary | ICD-10-CM

## 2021-11-16 NOTE — Progress Notes (Signed)
Called [facility] to sched appt for pt re : Anemia Was advised he has not been seen for this issue in over 3 years, and needs a new referral. Referral placed. Pt will be contacted.

## 2021-12-11 ENCOUNTER — Other Ambulatory Visit (INDEPENDENT_AMBULATORY_CARE_PROVIDER_SITE_OTHER): Payer: Medicare Other

## 2021-12-11 DIAGNOSIS — D649 Anemia, unspecified: Secondary | ICD-10-CM

## 2021-12-12 LAB — CBC WITH DIFFERENTIAL/PLATELET
Basophils Absolute: 0 10*3/uL (ref 0.0–0.1)
Basophils Relative: 0.6 % (ref 0.0–3.0)
Eosinophils Absolute: 0.3 10*3/uL (ref 0.0–0.7)
Eosinophils Relative: 5.4 % — ABNORMAL HIGH (ref 0.0–5.0)
HCT: 33 % — ABNORMAL LOW (ref 39.0–52.0)
Hemoglobin: 11 g/dL — ABNORMAL LOW (ref 13.0–17.0)
Lymphocytes Relative: 15.8 % (ref 12.0–46.0)
Lymphs Abs: 0.9 10*3/uL (ref 0.7–4.0)
MCHC: 33.3 g/dL (ref 30.0–36.0)
MCV: 82.6 fl (ref 78.0–100.0)
Monocytes Absolute: 0.6 10*3/uL (ref 0.1–1.0)
Monocytes Relative: 11.3 % (ref 3.0–12.0)
Neutro Abs: 3.8 10*3/uL (ref 1.4–7.7)
Neutrophils Relative %: 66.9 % (ref 43.0–77.0)
Platelets: 180 10*3/uL (ref 150.0–400.0)
RBC: 3.99 Mil/uL — ABNORMAL LOW (ref 4.22–5.81)
RDW: 19.5 % — ABNORMAL HIGH (ref 11.5–15.5)
WBC: 5.7 10*3/uL (ref 4.0–10.5)

## 2021-12-12 LAB — FERRITIN: Ferritin: 7.7 ng/mL — ABNORMAL LOW (ref 22.0–322.0)

## 2021-12-14 ENCOUNTER — Other Ambulatory Visit: Payer: Self-pay | Admitting: Cardiovascular Disease

## 2021-12-14 NOTE — Telephone Encounter (Signed)
Please schedule overdue 12 month F/U appointment for 90 day refills. Thank you! 

## 2021-12-15 ENCOUNTER — Other Ambulatory Visit: Payer: Self-pay | Admitting: Internal Medicine

## 2021-12-25 ENCOUNTER — Ambulatory Visit: Payer: Medicare Other | Admitting: Urology

## 2021-12-25 DIAGNOSIS — N138 Other obstructive and reflux uropathy: Secondary | ICD-10-CM

## 2021-12-25 DIAGNOSIS — N432 Other hydrocele: Secondary | ICD-10-CM

## 2021-12-25 DIAGNOSIS — R350 Frequency of micturition: Secondary | ICD-10-CM

## 2021-12-25 DIAGNOSIS — N3281 Overactive bladder: Secondary | ICD-10-CM

## 2021-12-25 LAB — BLADDER SCAN AMB NON-IMAGING

## 2021-12-25 MED ORDER — OXYBUTYNIN CHLORIDE ER 10 MG PO TB24: 10.0000 mg | ORAL_TABLET | Freq: Every day | ORAL | 3 refills | Status: DC

## 2021-12-25 NOTE — Progress Notes (Signed)
   12/25/2021 2:54 PM   Pravin Jenetta Downer Napp 1950/12/24 [id number]  Reason for visit: Follow up urinary symptoms, left hydrocele  HPI: 71 year old male with urologic issues of mild left hydrocele and urinary frequency.  Scrotal swelling started after hernia repair a few years ago and exam was most consistent with hydrocele and he opted for observation.  I saw him last in October 2023 for primarily urinary frequency every 1-2 hours during the day with some urgency, nocturia twice overnight.  Urinalysis was notable for >1000 glucose but otherwise benign, and PVR was normal.  He opted for a trial of Flomax.  He denies any significant improvement in the urination since that time, PVR is normal again today at 40 mL.  We reviewed behavioral strategies as well as the glucosuria/Jardiance contributing to his urinary frequency.  I do think it is reasonable to try an overactive bladder medication with his significant frequency and urgency.  Risks and benefits were discussed.  Flomax discontinued, trial of oxybutynin 10 mg XL daily Behavioral strategies discussed RTC 6 weeks PVR   Billey Co, [place] [address] [place], [place] [address] [phone]

## 2021-12-31 ENCOUNTER — Other Ambulatory Visit: Payer: Self-pay | Admitting: Internal Medicine

## 2021-12-31 ENCOUNTER — Other Ambulatory Visit: Payer: Self-pay | Admitting: Cardiovascular Disease

## 2022-01-06 NOTE — Progress Notes (Unsigned)
Cardiology Clinic Note   Patient Name: NASIF BOS Date of Encounter: 01/09/2022  Primary Care Provider:  Einar Pheasant, MD Primary Cardiologist:  Kathlyn Sacramento, MD  Patient Profile    Shane Mcknight is a 71 y.o. male with a past medical history of CAD with DES to distal RCA 2018, carotid artery disease, chronic systolic heart failure, hypertension, hyperlipidemia, T2DM who presents to the clinic today for 1 year follow-up of chronic cardiac conditions.   Past Medical History    Past Medical History:  Diagnosis Date   Allergic state    Anemia    Aortic atherosclerosis (HCC)    Arthritis    spine, hands, shoulder with previous cuff tear   B12 deficiency    CAD (coronary artery disease)    a.) 11/2015 CT: Coronary Ca2+ noted. b.) LHC 06/14/2016: 60% pRCA, 60% RPLB-2, 99% OM1, 50% mLAD, 40% oLM-LM, 30% pLAD, 60% oD1-D1, 60% oLCx, 95% dRCA; PCI placing a 2.5 x 3m Resolute Onyx DES dRCA. c.) LHC 09/14/2018: 40% oLM-LM, 50% mLAD, 30% pLAD, 60% oD1-D1, 40% oLCx, 100% OM1, 60% p-mRCA, 50% RPAV; dRCA stent widely patent; medical mgmt.   Candidiasis of esophagus (HCC)    Carotid artery stenosis 04/17/2020   a.) Carotid doppler 012/45/8099 483-38%RICA/LICA stenosis.   Colon polyp    COVID-19    09/2021   DDD (degenerative disc disease), lumbosacral    Diastolic dysfunction 025/05/3974  a.)  TTE 04/06/2020; EF 55-60%; mild LA dilation; no RWMAs; G2DD.   Dysphagia    Effort angina    Elevated PSA    Elevated transaminase level    Esophageal stricture    a.) s/p multiple dilations   Esophagitis, [place] grade D    Fatty liver    Ganglion cyst of finger of right hand    GERD (gastroesophageal reflux disease)    Guaiac positive stools    Hiatal hernia    HNP (herniated nucleus pulposus), lumbar    Hypercholesterolemia    Hypertension    Long term current use of immunosuppressive drug    a.) MTX + secukinumab for severe psoriatic arthritis   Lumbar radiculitis     Mallory-Weiss tear 11/2015   Migraine aura without headache    OSA (obstructive sleep apnea)    a.) does not use nocturnal PAP therapy   Psoriasis    Psoriatic arthritis ([place]    a.) severe; Tx'd with MTX + secukinumab   PVC (premature ventricular contraction)    Schatzki's ring    Type II diabetes mellitus ([place]    Past Surgical History:  Procedure Laterality Date   ANTERIOR CERVICAL DECOMP/DISCECTOMY FUSION N/A 04/13/2021   Procedure: C3-5 ANTERIOR CERVICAL DISCECTOMY AND FUSION (GLOBUS ALLOGRAFT);  Surgeon: YMeade Maw MD;  Location: [facility] ORS;  Service: Neurosurgery;  Laterality: N/A;   ARTHROPLASTY     right thumb; left thumb with 2 screws and plastic joint   BALLOON DILATION N/A 05/29/2015   Procedure: BALLOON DILATION;  Surgeon: MLollie Sails MD;  [facility];  Service: Endoscopy;  Laterality: N/A;   BALLOON DILATION N/A 02/14/2017   Procedure: BALLOON DILATION;  Surgeon: SLollie Sails MD;  [facility];  Service: Endoscopy;  Laterality: N/A;   BILATERAL CARPAL TUNNEL RELEASE Bilateral 07/04/2017   Procedure: BILATERAL CARPAL TUNNEL RELEASE;  Surgeon: KLeanor Kail MD;  Location: [facility] ORS;  Service: Orthopedics;  Laterality: Bilateral;   BLEPHAROPLASTY     BUNIONECTOMY Left 12/2020   CARDIAC CATHETERIZATION  1 stent    CARPAL TUNNEL RELEASE  2005   right   CHOLECYSTECTOMY  2008   COLONOSCOPY     COLONOSCOPY WITH PROPOFOL N/A 02/13/2018   Procedure: COLONOSCOPY WITH PROPOFOL;  Surgeon: Lollie Sails, MD;  Location: [facility] ENDOSCOPY;  Service: Endoscopy;  Laterality: N/A;   CORONARY STENT INTERVENTION N/A 06/14/2016   Procedure: Coronary Stent Intervention;  Surgeon: Wellington Hampshire, MD;  Location: [place] CV LAB;  Service: Cardiovascular;  Laterality: N/A;   CYST EXCISION  04/14/2015   tendon sheath cyst excision; right ring finger cyst removed and trigger finger realease   ESOPHAGOGASTRODUODENOSCOPY      ESOPHAGOGASTRODUODENOSCOPY (EGD) WITH PROPOFOL N/A 05/29/2015   Procedure: ESOPHAGOGASTRODUODENOSCOPY (EGD) WITH PROPOFOL;  Surgeon: Lollie Sails, MD;  Location: [facility] ENDOSCOPY;  Service: Endoscopy;  Laterality: N/A;   ESOPHAGOGASTRODUODENOSCOPY (EGD) WITH PROPOFOL N/A 07/04/2015   Procedure: ESOPHAGOGASTRODUODENOSCOPY (EGD) WITH PROPOFOL;  Surgeon: Lollie Sails, MD;  Location: [facility] END[facility]COPY;  Service: Endoscopy;  Laterality: N/A;   ESOPHAGOGASTRODUODENOSCOPY (EGD) WITH PROPOFOL N/A 01/04/2016   Procedure: ESOPHAGOGASTRODUODENOSCOPY (EGD) WITH PROPOFOL;  Surgeon: Lollie Sails, MD;  Location: [facility] ENDOSCOPY;  Service: Endoscopy;  Laterality: N/A;   ESOPHAGOGASTRODUODENOSCOPY (EGD) WITH PROPOFOL N/A 03/22/2016   Procedure: ESOPHAGOGASTRODUODENOSCOPY (EGD) WITH PROPOFOL;  Surgeon: Lollie Sails, MD;  Location: [facility] ENDOSCOPY;  Service: Endoscopy;  Laterality: N/A;   ESOPHAGOGASTRODUODENOSCOPY (EGD) WITH PROPOFOL N/A 02/14/2017   Procedure: ESOPHAGOGASTRODUODENOSCOPY (EGD) WITH PROPOFOL;  Surgeon: Lollie Sails, MD;  Location: [facility] ENDOSCOPY;  Service: Endoscopy;  Laterality: N/A;   ESOPHAGOGASTRODUODENOSCOPY (EGD) WITH PROPOFOL N/A 04/11/2017   Procedure: ESOPHAGOGASTRODUODENOSCOPY (EGD) WITH PROPOFOL;  Surgeon: Lollie Sails, MD;  Location: [facility] ENDOSCOPY;  Service: Endoscopy;  Laterality: N/A;   ESOPHAGOGASTRODUODENOSCOPY (EGD) WITH PROPOFOL N/A 07/07/2017   Procedure: ESOPHAGOGASTRODUODENOSCOPY (EGD) WITH PROPOFOL;  Surgeon: Lucilla Lame, MD;  Location: [facility] ENDOSCOPY;  Service: Endoscopy;  Laterality: N/A;   ESOPHAGOGASTRODUODENOSCOPY (EGD) WITH PROPOFOL N/A 02/13/2018   Procedure: ESOPHAGOGASTRODUODENOSCOPY (EGD) WITH PROPOFOL;  Surgeon: Lollie Sails, MD;  Location: [facility] ENDOSCOPY;  Service: Endoscopy;  Laterality: N/A;   FOREIGN BODY REMOVAL N/A 12/03/2015   Procedure: FOREIGN BODY REMOVAL;  Surgeon: Wilford Corner, MD;  Location: [facility] ENDOSCOPY;  Service:  Endoscopy;  Laterality: N/A;   HERNIA REPAIR Right 1970   inguinal   INGUINAL HERNIA REPAIR Left 01/01/2018   Procedure: OPEN HERNIA REPAIR INGUINAL ADULT;  Surgeon: Olean Ree, MD;  Location: [facility] ORS;  Service: General;  Laterality: Left;   LEFT HEART CATH Left 06/14/2016   Procedure: Left Heart Cath;  Surgeon: Wellington Hampshire, MD;  Location: [place] CV LAB;  Service: Cardiovascular;  Laterality: Left;   LEFT HEART CATH AND CORONARY ANGIOGRAPHY Left 09/14/2018   Procedure: LEFT HEART CATH AND CORONARY ANGIOGRAPHY;  Surgeon: Wellington Hampshire, MD;  Location: [place] CV LAB;  Service: Cardiovascular;  Laterality: Left;   LUMBAR DISC SURGERY  2004   injections; herniated disc; percutaneous discectomy   NASAL SINUS SURGERY  2008   ROTATOR CUFF REPAIR Right    TONSILLECTOMY      Allergies  Allergies  Allergen Reactions   Ephedrine Other (See Comments)    Causing prostate to hurt. Using for sinus bronchodilators Pain in groin, prostate   Nsaids Other (See Comments)    GI upset, history of esophagitis and Mallory-Weis tear   Other Rash, Other (See Comments) and Nausea Only    Paper tape causes rash Plastic tape is okay GI upset, history of esophagitis and  Mallory-Weis tear   Vancomycin Itching and Rash    Same as pcn with itching   Amoxicillin Itching and Rash   Hyoscyamine Sulfate Other (See Comments)    Urinary retention Urine retention   Levsin [Hyoscyamine Sulfate] Other (See Comments)    Urinary retention   Naproxen Other (See Comments)    GI upset Also happens with other NSAIDS   Penicillins Itching and Rash    TOLERATED CEFAZOLIN Has patient had a PCN reaction causing immediate rash, facial/tongue/throat swelling, SOB or lightheadedness with hypotension: yes Has patient had a PCN reaction causing severe rash involving mucus membranes or skin necrosis: no Has patient had a PCN reaction that required hospitalization: no Has patient had a PCN reaction  occurring within the last 10 years: no If all of the above answers are "NO", then may proceed with Cephalosporin use.    Shellfish Allergy Itching, Nausea Only and Rash    Ingested shellfish cCuses rash and itching along with stomach sickness. Pt tolerates betadine    Sudafed [Pseudoephedrine Hcl] Other (See Comments)    Constricts prostate flow (from ephedrine d/t sinus meds)    History of Present Illness    Emilian O Guiles has a past medical history of: CAD. Nuclear stress test 05/31/2016: Medium defect of moderate severity basal inferoseptal, basal inferior, mid inferoseptal, and mid inferior consistent with prior MI with significant peri-infarct ischemia.  LHC 06/14/2016: Proximal RCA 60%, distal RCA 95%, 2nd RPLB 60%, OM1 99%, mid LAD 50%, ostial LM 40%, proximal LAD 30%, ostial 1st diag 60%, ostial Cx 60%. PCI with DES 2.5 x 15 to distal RCA.  LHC 09/14/2018: Patent RCA stent. Stable moderate disease in mid LAD and proximal RCA. OM1 is now completed occluded. Progression of distal RCA disease just proximal to stent in a tortuous segment causing 60% stenosis. Otherwise unchanged from previous.  Nuclear stress test 03/31/2020: Normal, low risk study.  Carotid artery disease. Carotid duplex US 03/05/2016: Right ICA 40-59%, left ICA 1-39%.   Carotid duplex US 07/06/2021: Bilateral ICA 40-59%. Left CCA >50%.   Chronic systolic heart failure.  Echo: 04/06/2020: EF 55-60%. Grade II DD. Mildly dilated LA.  Hypertension. Hyperlipidemia.  Lipid panel 11/05/2021: LDL 77, HDL 40, TG 85, total 134.  T2DM.  A1c 11/05/2021: 7.6.   Mr. Araki was first evaluated by cardiology on 05/21/2016 for chest pain at the request of Dr. Lacinda Axon. Stress test showed intermediate risk with medium defect of moderate severity in basal inferoseptal, basal inferior, mid inferoseptal and mid inferior location. Findings were consistent with prior MI and significant peri-infarct ischemia. LHC showed significant two vessel CAD with  severe stenosis of distal RCA. Patient underwent successful PCI with DES to RCA. Repeat LHC in August 2020 was essentially unchanged with patent distal RCA stent, completely occulded OM1, and progression of disease proximal to stent. Last nuclear stress test in March 2022 was a normal, low risk study. Echo in March 2022 with EF 55-60%, grade II DD. Patient was last seen in the office by Dr. Fletcher Anon on 10/24/2020. At that time no changes were made.   Today, patient is doing well. He denies shortness of breath or dyspnea on exertion. No chest pain, pressure, or tightness. Denies lower extremity edema, orthopnea, or PND. No palpitations. His weight is stable. He continues to work a mainly sedentary job but makes sure he gets up from his desk to stretch and walk around at least once an hour. He has chronic back issues that prevent him from  doing formal exercise. He follows a heart healthy, low sodium diet.    Home Medications    Current Meds  Medication Sig   acetaminophen (TYLENOL) 500 MG tablet Take 1,000 mg by mouth daily as needed for moderate pain.   aspirin EC 81 MG tablet Take 81 mg by mouth every evening.    atenolol (TENORMIN) 50 MG tablet TAKE 1 TABLET BY MOUTH  TWICE DAILY   chlorpheniramine-HYDROcodone (TUSSIONEX) 10-8 MG/5ML Take 5 mLs by mouth at bedtime as needed for cough.   empagliflozin (JARDIANCE) 10 MG TABS tablet Take 1 tablet (10 mg total) by mouth daily before breakfast.   Fe Fum-FePoly-Vit C-Vit B3 (INTEGRA) 62.5-62.5-40-3 MG CAPS One capsule daily.   folic acid (FOLVITE) 1 MG tablet Take 1 mg by mouth daily.   gabapentin (NEURONTIN) 300 MG capsule TAKE 1 TO 2 CAPSULES BY  MOUTH AT BEDTIME (Patient taking differently: TAKE 1 TO 2 CAPSULES BY  MOUTH AT BEDTIME)   glucose blood (ONETOUCH ULTRA) test strip Used to check blood glucose one time per day.   isosorbide mononitrate (IMDUR) 30 MG 24 hr tablet TAKE ONE-HALF TABLET BY MOUTH  DAILY   losartan (COZAAR) 25 MG tablet TAKE  ONE-HALF TABLET BY  MOUTH DAILY (Patient taking differently: Take 12.5 mg by mouth every morning.)   melatonin 5 MG TABS Take 5 mg by mouth at bedtime as needed.   methocarbamol (ROBAXIN) 500 MG tablet Take 1 tablet (500 mg total) by mouth every 6 (six) hours as needed for muscle spasms.   methotrexate (RHEUMATREX) 2.5 MG tablet Take 8 tablets (20 mg total) by mouth once a week. Take 8 tablets every Friday.   nitroGLYCERIN (NITROSTAT) 0.4 MG SL tablet Place 1 tablet (0.4 mg total) under the tongue every 5 (five) minutes as needed. MAXIMUM OF 3 DOSES.   oxybutynin (DITROPAN-XL) 10 MG 24 hr tablet Take 1 tablet (10 mg total) by mouth daily.   pantoprazole (PROTONIX) 40 MG tablet TAKE 1 TABLET BY MOUTH TWICE  DAILY   rosuvastatin (CRESTOR) 10 MG tablet TAKE 1 TABLET BY MOUTH  DAILY   senna (SENOKOT) 8.6 MG TABS tablet Take 1 tablet (8.6 mg total) by mouth 2 (two) times daily.   sodium chloride (OCEAN) 0.65 % SOLN nasal spray Place 1 spray into both nostrils as needed for congestion.   SYRINGE-NEEDLE, DISP, 3 ML 25G X 5/8" 3 ML MISC Use as instructed with B12 injection.   vitamin E 400 UNIT capsule Take 400 Units by mouth in the morning.    Family History    Family History  Problem Relation Age of Onset   Lung cancer Father    Cancer Father    Hypertension Mother    Aneurysm Son    He indicated that his mother is deceased. He indicated that his father is deceased. He indicated that the status of his son is unknown.   Social History    Social History   Socioeconomic History   Marital status: Married    Spouse name: Pamala Hurry   Number of children: Not on file   Years of education: Not on file   Highest education level: Not on file  Occupational History    Comment: not heavy work  Tobacco Use   Smoking status: Former    Packs/day: 0.25    Years: 7.00    Total pack years: 1.75    Types: Cigarettes    Quit date: 07/06/1982    Years since quitting: 47.5  Passive exposure: Past    Smokeless tobacco: Never  Vaping Use   Vaping Use: Never used  Substance and Sexual Activity   Alcohol use: Not Currently   Drug use: No   Sexual activity: Not on file  Other Topics Concern   Not on file  Social History Narrative   Lives locally with wife.  Active but does not routinely exercise.   Social Determinants of Health   Financial Resource Strain: Low Risk  (02/15/2021)   Overall Financial Resource Strain (CARDIA)    Difficulty of Paying Living Expenses: Not hard at all  Food Insecurity: No Food Insecurity (02/15/2021)   Hunger Vital Sign    Worried About Running Out of Food in the Last Year: Never true    Ran Out of Food in the Last Year: Never true  Transportation Needs: No Transportation Needs (02/15/2021)   PRAPARE - Hydrologist (Medical): No    Lack of Transportation (Non-Medical): No  Physical Activity: Not on file  Stress: No Stress Concern Present (02/15/2021)   [place]    Feeling of Stress : Not at all  Social Connections: Unknown (02/15/2021)   Social Connection and Isolation Panel [NHANES]    Frequency of Communication with Friends and Family: Not on file    Frequency of Social Gatherings with Friends and Family: Not on file    Attends Religious Services: Not on file    Active Member of Clubs or Organizations: Not on file    Attends Archivist Meetings: Not on file    Marital Status: Married  Intimate Partner Violence: Not At Risk (02/15/2021)   Humiliation, Afraid, Rape, and Kick questionnaire    Fear of Current or Ex-Partner: No    Emotionally Abused: No    Physically Abused: No    Sexually Abused: No     Review of Systems    General:  No chills, fever, night sweats or weight changes.  Cardiovascular:  No chest pain, dyspnea on exertion, edema, orthopnea, palpitations, paroxysmal nocturnal dyspnea. Dermatological: No rash, lesions/masses Respiratory:  No cough, dyspnea Urologic: No hematuria, dysuria Abdominal:   No nausea, vomiting, diarrhea, bright red blood per rectum, melena, or hematemesis Neurologic:  No visual changes, weakness, changes in mental status. All other systems reviewed and are otherwise negative except as noted above.  Physical Exam    VS:  BP 100/60 (BP Location: Left Arm, Patient Position: Sitting, Cuff Size: Normal)   Pulse 65   Ht 5' 8.5" (1.74 m)   Wt 171 lb 3.2 oz (77.7 kg)   SpO2 97%   BMI 25.65 kg/m  , BMI Body mass index is 25.65 kg/m. GEN:  Well nourished, well developed, in no acute distress. HEENT: Normal. Neck: Supple, no JVD, carotid bruits, or masses. Cardiac: RRR, no murmurs, rubs, or gallops. No clubbing, cyanosis, edema.  Radials/DP/PT 2+ and equal bilaterally.  Respiratory:  Respirations regular and unlabored, clear to auscultation bilaterally. GI: Soft, nontender, nondistended. MS: No deformity or atrophy. Skin: Warm and dry, no rash. Neuro: Strength and sensation are intact. Psych: Normal affect.  Accessory Clinical Findings   Recent Labs: 11/05/2021: ALT 13; BUN 8; Creatinine, Ser 0.76; Potassium 3.8; Sodium 139; TSH 1.26 12/11/2021: Hemoglobin 11.0; Platelets 180.0   Recent Lipid Panel    Component Value Date/Time   CHOL 134 11/05/2021 0921   TRIG 85.0 11/05/2021 0921   HDL 40.20 11/05/2021 0921   CHOLHDL 3 11/05/2021  7847   VLDL 17.0 11/05/2021 0921   LDLCALC 77 11/05/2021 0921        ECG personally reviewed by me today: NSR, HR 65.  No significant changes from 03/20/2020.    Assessment & Plan   CAD. [place] August 2020 showed patent stent and stable moderate CAD. Nuclear stress test in March 2022 was normal, low risk study. Patient denies chest pain, pressure or tightness. He does not participate in formal exercise but has not chest pain or shortness of breath with walking at work. Continue aspirin, atenolol, isosorbide, crestor, and prn SL NTG.  Carotid artery disease. [place]  June 2023 showed bilateral ICA 40-59%, left CCA >50%. Will repeat in June 2024. Continue Crestor.  Chronic systolic heart failure. Echo March 2022 EF 55-60%, grade II DD. Euvolemic and well compensated on exam. Weight is stable. Breath sounds are clear to auscultation. Continue atenolol, losartan, jardiance, and isosorbide.  Hypertension. BP today 100/60. Patient denies headaches or dizziness. Continue atenolol, losartan, and isosorbide.  Hyperlipidemia. LDL 11/05/2020 77, not at goal. Will increase Crestor to 20 mg nightly. He will return in 10-12 weeks for repeat lipid panel and LFTs.        Disposition: Increase Crestor to 20 mg nightly. Return in 10-12 weeks for repeat lipid panel and LFTs. Carotid US June 2024. Follow-up in 1 year or sooner as needed.    Justice Britain. Herberth Deharo, DNP, NP-C     01/09/2022, 2:47 PM [place] [address] Office [phone] Fax [phone]   I spent 10 minutes examining this patient, reviewing medications, and using patient centered shared decision making involving her cardiac care.  Prior to her visit I spent greater than 20 minutes reviewing her past medical history,  medications, and prior cardiac tests.

## 2022-01-08 ENCOUNTER — Other Ambulatory Visit: Payer: Self-pay | Admitting: Internal Medicine

## 2022-01-08 MED ORDER — INTEGRA 62.5-62.5-40-3 MG PO CAPS: ORAL_CAPSULE | ORAL | 2 refills | Status: DC

## 2022-01-08 NOTE — Progress Notes (Signed)
Rx sent in for [place].

## 2022-01-09 ENCOUNTER — Other Ambulatory Visit: Payer: Self-pay

## 2022-01-09 ENCOUNTER — Encounter: Payer: Self-pay | Admitting: Cardiology

## 2022-01-09 ENCOUNTER — Ambulatory Visit: Payer: Medicare Other | Attending: Cardiology | Admitting: Student

## 2022-01-09 VITALS — BP 100/60 | HR 65 | Ht 68.5 in | Wt 171.2 lb

## 2022-01-09 DIAGNOSIS — I5022 Chronic systolic (congestive) heart failure: Secondary | ICD-10-CM

## 2022-01-09 DIAGNOSIS — I10 Essential (primary) hypertension: Secondary | ICD-10-CM

## 2022-01-09 DIAGNOSIS — I6523 Occlusion and stenosis of bilateral carotid arteries: Secondary | ICD-10-CM | POA: Diagnosis not present

## 2022-01-09 DIAGNOSIS — E785 Hyperlipidemia, unspecified: Secondary | ICD-10-CM | POA: Diagnosis not present

## 2022-01-09 DIAGNOSIS — D649 Anemia, unspecified: Secondary | ICD-10-CM

## 2022-01-09 DIAGNOSIS — I2510 Atherosclerotic heart disease of native coronary artery without angina pectoris: Secondary | ICD-10-CM

## 2022-01-09 DIAGNOSIS — Z981 Arthrodesis status: Secondary | ICD-10-CM

## 2022-01-09 MED ORDER — ROSUVASTATIN CALCIUM 20 MG PO TABS: 20.0000 mg | ORAL_TABLET | Freq: Every day | ORAL | 3 refills | Status: DC

## 2022-01-09 NOTE — Patient Instructions (Signed)
Medication Instructions:   Your physician has recommended you make the following change in your medication:   Increase Crestor 20 mg tablet by mouth daily.  *If you need a refill on your cardiac medications before your next appointment, please call your pharmacy*   Lab Work:  Your physician recommends that you return for lab work in: 10-12 weeks at [facility].  Fasting Lipid/Hepatic Panel  If you have labs (blood work) drawn today and your tests are completely normal, you will receive your results only by: MyChart Message (if you have MyChart) OR A paper copy in the mail If you have any lab test that is abnormal or we need to change your treatment, we will call you to review the results.   Testing/Procedures:  Your physician has requested that you have a carotid duplex. This test is an ultrasound of the carotid arteries in your neck. It looks at blood flow through these arteries that supply the brain with blood. Allow one hour for this exam. There are no restrictions or special instructions.    Follow-Up: At [facility], you and your health needs are our priority.  As part of our continuing mission to provide you with exceptional heart care, we have created designated Provider Care Teams.  These Care Teams include your primary Cardiologist (physician) and Advanced Practice Providers (APPs -  Physician Assistants and Nurse Practitioners) who all work together to provide you with the care you need, when you need it.  We recommend signing up for the patient portal called "MyChart".  Sign up information is provided on this After Visit Summary.  MyChart is used to connect with patients for Virtual Visits (Telemedicine).  Patients are able to view lab/test results, encounter notes, upcoming appointments, etc.  Non-urgent messages can be sent to your provider as well.   To learn more about what you can do with MyChart, go to [email or web address].    Your next  appointment:    12 Month follow up   The format for your next appointment:   In Person  Provider:   You may see Kathlyn Sacramento, MD or one of the following Advanced Practice Providers on your designated Care Team:   Murray Hodgkins, NP Christell Faith, PA-C Cadence Kathlen Mody, PA-C Gerrie Nordmann, NP     Important Information About Sugar

## 2022-01-15 ENCOUNTER — Encounter: Payer: Self-pay | Admitting: Neurosurgery

## 2022-01-15 ENCOUNTER — Ambulatory Visit
Admission: RE | Admit: 2022-01-15 | Discharge: 2022-01-15 | Disposition: A | Discharge status: Home / Self Care | Payer: Medicare Other | Attending: Neurosurgery | Admitting: Neurosurgery

## 2022-01-15 ENCOUNTER — Ambulatory Visit: Payer: Medicare Other | Admitting: Neurosurgery

## 2022-01-15 ENCOUNTER — Ambulatory Visit
Admission: RE | Admit: 2022-01-15 | Discharge: 2022-01-15 | Disposition: A | Discharge status: Home / Self Care | Payer: Medicare Other | Source: Ambulatory Visit | Attending: Neurosurgery | Admitting: Neurosurgery

## 2022-01-15 VITALS — BP 134/78 | Ht 68.5 in | Wt 167.4 lb

## 2022-01-15 DIAGNOSIS — Z09 Encounter for follow-up examination after completed treatment for conditions other than malignant neoplasm: Secondary | ICD-10-CM | POA: Diagnosis not present

## 2022-01-15 DIAGNOSIS — Z981 Arthrodesis status: Secondary | ICD-10-CM

## 2022-01-15 DIAGNOSIS — M47812 Spondylosis without myelopathy or radiculopathy, cervical region: Secondary | ICD-10-CM | POA: Diagnosis not present

## 2022-01-15 DIAGNOSIS — G959 Disease of spinal cord, unspecified: Secondary | ICD-10-CM | POA: Diagnosis not present

## 2022-01-15 DIAGNOSIS — M4322 Fusion of spine, cervical region: Secondary | ICD-10-CM | POA: Diagnosis not present

## 2022-01-15 DIAGNOSIS — M4312 Spondylolisthesis, cervical region: Secondary | ICD-10-CM | POA: Diagnosis not present

## 2022-01-15 DIAGNOSIS — M4802 Spinal stenosis, cervical region: Secondary | ICD-10-CM

## 2022-01-15 NOTE — Progress Notes (Signed)
REFERRING PHYSICIAN:  Ricard Dillon, [address], [place] [address]  DOS: 04/13/21 C3-5 ACDF   HISTORY OF PRESENT ILLNESS: Zayven O Scheirer is status post ACDF for cervical radiculopathy and myelopathy.   His strength has improved. He is doing very well.  He does have some occasional discomfort into his shoulder and in his lap.  PHYSICAL EXAMINATION:  NEUROLOGICAL:  General: In no acute distress.  Awake, alert, oriented to person, place, and time. Pupils equal round and reactive to light.   Strength: Side Biceps Triceps Deltoid Interossei Grip Wrist Ext. Wrist Flex.  R '5 5 5 5 5 5 5  '$ L '5 5 5 5 '$ 4+ 5 5   Incision c/d/I  Imaging:  No complications noted   Assessment / Plan: Gillian O Huy is doing well after ACDF.  His strength has improved substantially.   I will see him back on an as-needed basis    Meade Maw MD, [facility] Department of Neurosurgery

## 2022-01-17 ENCOUNTER — Ambulatory Visit: Payer: Medicare Other | Attending: Student

## 2022-01-17 DIAGNOSIS — I6523 Occlusion and stenosis of bilateral carotid arteries: Secondary | ICD-10-CM

## 2022-01-18 ENCOUNTER — Other Ambulatory Visit: Payer: Self-pay

## 2022-01-18 DIAGNOSIS — D649 Anemia, unspecified: Secondary | ICD-10-CM

## 2022-02-05 ENCOUNTER — Encounter: Payer: Self-pay | Admitting: Urology

## 2022-02-05 ENCOUNTER — Ambulatory Visit: Payer: Medicare Other | Admitting: Urology

## 2022-02-05 VITALS — BP 115/68 | HR 70 | Ht 68.5 in | Wt 170.0 lb

## 2022-02-05 DIAGNOSIS — N138 Other obstructive and reflux uropathy: Secondary | ICD-10-CM

## 2022-02-05 DIAGNOSIS — R351 Nocturia: Secondary | ICD-10-CM | POA: Diagnosis not present

## 2022-02-05 DIAGNOSIS — R3915 Urgency of urination: Secondary | ICD-10-CM

## 2022-02-05 DIAGNOSIS — R350 Frequency of micturition: Secondary | ICD-10-CM | POA: Diagnosis not present

## 2022-02-05 DIAGNOSIS — N3281 Overactive bladder: Secondary | ICD-10-CM

## 2022-02-05 DIAGNOSIS — N401 Enlarged prostate with lower urinary tract symptoms: Secondary | ICD-10-CM

## 2022-02-05 LAB — BLADDER SCAN AMB NON-IMAGING

## 2022-02-05 NOTE — Patient Instructions (Signed)

## 2022-02-05 NOTE — Progress Notes (Signed)
   02/05/2022 4:03 PM   Urias Jenetta Downer Remmers 25-Jul-1950 [id number]  Reason for visit: Follow up urinary symptoms, left hydrocele  HPI: 72 year old male with urologic issues of mild left hydrocele and urinary frequency.  Scrotal swelling started after hernia repair a few years ago and exam was most consistent with hydrocele and he opted for observation.  I saw him in October 2023 for primarily urinary frequency every 1-2 hours during the day with some urgency, nocturia twice overnight.  Urinalysis was notable for >1000 glucose but otherwise benign, and PVR was normal.  He opted for a trial of Flomax.  He had no improvement on the Flomax and opted for a trial of oxybutynin at our last visit on 12/25/2021.  He also reports no significant improvement on the oxybutynin.  PVR continues to be normal, 78m today.  We reviewed behavioral strategies as well as the glucosuria/Jardiance contributing to his urinary frequency.  We discussed cystoscopy as another alternative to evaluate his urinary symptoms but he defers for the time being.  Suspect his urinary symptoms is primarily secondary to glucosuria on the Jardiance, and he agrees his urinary symptoms definitely worsened when he started that medication.  Recommended discussing alternatives with his PCP.  RTC 6 months PVR, symptom check   BBilley Co MD  [place] [place] [phone]

## 2022-02-06 ENCOUNTER — Telehealth: Payer: Self-pay | Admitting: *Deleted

## 2022-02-06 ENCOUNTER — Other Ambulatory Visit (INDEPENDENT_AMBULATORY_CARE_PROVIDER_SITE_OTHER): Payer: Medicare Other

## 2022-02-06 DIAGNOSIS — E0822 Diabetes mellitus due to underlying condition with diabetic chronic kidney disease: Secondary | ICD-10-CM

## 2022-02-06 DIAGNOSIS — D649 Anemia, unspecified: Secondary | ICD-10-CM | POA: Diagnosis not present

## 2022-02-06 DIAGNOSIS — N183 Chronic kidney disease, stage 3 unspecified: Secondary | ICD-10-CM

## 2022-02-06 DIAGNOSIS — I10 Essential (primary) hypertension: Secondary | ICD-10-CM | POA: Diagnosis not present

## 2022-02-06 DIAGNOSIS — E785 Hyperlipidemia, unspecified: Secondary | ICD-10-CM | POA: Diagnosis not present

## 2022-02-06 LAB — BASIC METABOLIC PANEL
BUN: 14 mg/dL (ref 6–23)
CO2: 25 mEq/L (ref 19–32)
Calcium: 9.2 mg/dL (ref 8.4–10.5)
Chloride: 105 mEq/L (ref 96–112)
Creatinine, Ser: 0.78 mg/dL (ref 0.40–1.50)
GFR: 88.57 mL/min (ref >60.00)
Glucose, Bld: 193 mg/dL — ABNORMAL HIGH (ref 70–99)
Potassium: 4.1 mEq/L (ref 3.5–5.1)
Sodium: 140 mEq/L (ref 135–145)

## 2022-02-06 LAB — CBC WITH DIFFERENTIAL/PLATELET
Basophils Absolute: 0 10*3/uL (ref 0.0–0.1)
Basophils Relative: 0.4 % (ref 0.0–3.0)
Eosinophils Absolute: 0.2 10*3/uL (ref 0.0–0.7)
Eosinophils Relative: 3 % (ref 0.0–5.0)
HCT: 35.5 % — ABNORMAL LOW (ref 39.0–52.0)
Hemoglobin: 11.8 g/dL — ABNORMAL LOW (ref 13.0–17.0)
Lymphocytes Relative: 15.5 % (ref 12.0–46.0)
Lymphs Abs: 1 10*3/uL (ref 0.7–4.0)
MCHC: 33.2 g/dL (ref 30.0–36.0)
MCV: 83.5 fl (ref 78.0–100.0)
Monocytes Absolute: 0.6 10*3/uL (ref 0.1–1.0)
Monocytes Relative: 9 % (ref 3.0–12.0)
Neutro Abs: 4.4 10*3/uL (ref 1.4–7.7)
Neutrophils Relative %: 72.1 % (ref 43.0–77.0)
Platelets: 227 10*3/uL (ref 150.0–400.0)
RBC: 4.25 Mil/uL (ref 4.22–5.81)
RDW: 21 % — ABNORMAL HIGH (ref 11.5–15.5)
WBC: 6.1 10*3/uL (ref 4.0–10.5)

## 2022-02-06 LAB — IBC + FERRITIN
Ferritin: 17.3 ng/mL — ABNORMAL LOW (ref 22.0–322.0)
Iron: 144 ug/dL (ref 42–165)
Saturation Ratios: 46.8 % (ref 20.0–50.0)
TIBC: 308 ug/dL (ref 250.0–450.0)
Transferrin: 220 mg/dL (ref 212.0–360.0)

## 2022-02-06 LAB — LIPID PANEL
Cholesterol: 96 mg/dL (ref 0–200)
HDL: 33.4 mg/dL — ABNORMAL LOW (ref >39.00)
LDL Cholesterol: 41 mg/dL (ref 0–99)
NonHDL: 62.95
Total CHOL/HDL Ratio: 3
Triglycerides: 111 mg/dL (ref 0.0–149.0)
VLDL: 22.2 mg/dL (ref 0.0–40.0)

## 2022-02-06 LAB — HEPATIC FUNCTION PANEL
ALT: 12 U/L (ref 0–53)
AST: 13 U/L (ref 0–37)
Albumin: 4.1 g/dL (ref 3.5–5.2)
Alkaline Phosphatase: 50 U/L (ref 39–117)
Bilirubin, Direct: 0.1 mg/dL (ref 0.0–0.3)
Total Bilirubin: 0.4 mg/dL (ref 0.2–1.2)
Total Protein: 6.7 g/dL (ref 6.0–8.3)

## 2022-02-06 LAB — HEMOGLOBIN A1C: Hgb A1c MFr Bld: 7.5 % — ABNORMAL HIGH (ref 4.6–6.5)

## 2022-02-06 NOTE — Telephone Encounter (Signed)
It appears that these labs were scheduled at two different times.  Is there anyway to add on the labs ordered on 11/11/21 to the blood drawn.

## 2022-02-06 NOTE — Addendum Note (Signed)
Addended by: Leeanne Rio on: 02/06/2022 03:10 PM   Modules accepted: Orders

## 2022-02-06 NOTE — Telephone Encounter (Signed)
Pt came in for labs this morning for CBC & iron studies. He also has anotehr lab appt in five days for more labs. Pt wants to know why he needed two lab appt so close together. I told him that someone will call him or send a mychart message to explain. I also told him if may be one call to go over lab results & discuss second lab appt.

## 2022-02-07 NOTE — Telephone Encounter (Signed)
Labs added on.

## 2022-02-11 ENCOUNTER — Other Ambulatory Visit: Payer: Medicare Other

## 2022-02-14 ENCOUNTER — Encounter: Payer: Self-pay | Admitting: Internal Medicine

## 2022-02-14 ENCOUNTER — Ambulatory Visit (INDEPENDENT_AMBULATORY_CARE_PROVIDER_SITE_OTHER): Payer: Medicare Other | Admitting: Internal Medicine

## 2022-02-14 VITALS — BP 118/70 | HR 59 | Temp 97.9°F | Resp 16 | Ht 68.5 in | Wt 170.4 lb

## 2022-02-14 DIAGNOSIS — R1319 Other dysphagia: Secondary | ICD-10-CM | POA: Diagnosis not present

## 2022-02-14 DIAGNOSIS — I5022 Chronic systolic (congestive) heart failure: Secondary | ICD-10-CM

## 2022-02-14 DIAGNOSIS — I10 Essential (primary) hypertension: Secondary | ICD-10-CM

## 2022-02-14 DIAGNOSIS — E0822 Diabetes mellitus due to underlying condition with diabetic chronic kidney disease: Secondary | ICD-10-CM | POA: Diagnosis not present

## 2022-02-14 DIAGNOSIS — Z86010 Personal history of colonic polyps: Secondary | ICD-10-CM

## 2022-02-14 DIAGNOSIS — D649 Anemia, unspecified: Secondary | ICD-10-CM

## 2022-02-14 DIAGNOSIS — I779 Disorder of arteries and arterioles, unspecified: Secondary | ICD-10-CM | POA: Diagnosis not present

## 2022-02-14 DIAGNOSIS — R350 Frequency of micturition: Secondary | ICD-10-CM

## 2022-02-14 DIAGNOSIS — N183 Chronic kidney disease, stage 3 unspecified: Secondary | ICD-10-CM

## 2022-02-14 DIAGNOSIS — L4050 Arthropathic psoriasis, unspecified: Secondary | ICD-10-CM

## 2022-02-14 DIAGNOSIS — E785 Hyperlipidemia, unspecified: Secondary | ICD-10-CM | POA: Diagnosis not present

## 2022-02-14 DIAGNOSIS — R7989 Other specified abnormal findings of blood chemistry: Secondary | ICD-10-CM

## 2022-02-14 DIAGNOSIS — I25119 Atherosclerotic heart disease of native coronary artery with unspecified angina pectoris: Secondary | ICD-10-CM

## 2022-02-14 DIAGNOSIS — K219 Gastro-esophageal reflux disease without esophagitis: Secondary | ICD-10-CM

## 2022-02-14 LAB — HM DIABETES FOOT EXAM

## 2022-02-14 MED ORDER — DOXYCYCLINE HYCLATE 100 MG PO TABS: 100.0000 mg | ORAL_TABLET | Freq: Two times a day (BID) | ORAL | 0 refills | Status: DC

## 2022-02-14 MED ORDER — GLIPIZIDE ER 2.5 MG PO TB24: 2.5000 mg | ORAL_TABLET | Freq: Every day | ORAL | 2 refills | Status: DC

## 2022-02-14 NOTE — Progress Notes (Signed)
Subjective:    Patient ID: Shane Mcknight, male    DOB: [birth year], 72 y.o.   MRN: [id number]  Patient here for  Chief Complaint  Patient presents with   Medical Management of Chronic Issues    HPI Here to follow up regarding his diabetes, hypertension and hypercholesterolemia.  Has been seeing Dr Diamantina Providence - increased urinary frequency and urgency.  Feels symptoms worsened since starting jardiance.  Unable to take metformin - intolerance.  Had itching - better since off metformin.  Has swallowing issues, so would avoid GLP - 1 agonist.  He does report his swallowing is better.  Has been "grazing" and not eating large meals.  Decreased tomato sauce, etc.  Still with issues.  Also has had issues with iron supplements.  Tried integra.  Still having issues with his bowels.  Request referral to hematology.     Past Medical History:  Diagnosis Date   Allergic state    Anemia    Aortic atherosclerosis (HCC)    Arthritis    spine, hands, shoulder with previous cuff tear   B12 deficiency    CAD (coronary artery disease)    a.) 11/2015 CT: Coronary Ca2+ noted. b.) LHC 06/14/2016: 60% pRCA, 60% RPLB-2, 99% OM1, 50% mLAD, 40% oLM-LM, 30% pLAD, 60% oD1-D1, 60% oLCx, 95% dRCA; PCI placing a 2.5 x 58m Resolute Onyx DES dRCA. c.) LHC 09/14/2018: 40% oLM-LM, 50% mLAD, 30% pLAD, 60% oD1-D1, 40% oLCx, 100% OM1, 60% p-mRCA, 50% RPAV; dRCA stent widely patent; medical mgmt.   Candidiasis of esophagus (HCC)    Carotid artery stenosis 04/17/2020   a.) Carotid doppler 016/01/930 435-57%RICA/LICA stenosis.   Colon polyp    COVID-19    09/2021   DDD (degenerative disc disease), lumbosacral    Diastolic dysfunction 032/20/2542  a.)  TTE 04/06/2020; EF 55-60%; mild LA dilation; no RWMAs; G2DD.   Dysphagia    Effort angina    Elevated PSA    Elevated transaminase level    Esophageal stricture    a.) s/p multiple dilations   Esophagitis, [place] grade D    Fatty liver    Ganglion cyst of finger of  right hand    GERD (gastroesophageal reflux disease)    Guaiac positive stools    Hiatal hernia    HNP (herniated nucleus pulposus), lumbar    Hypercholesterolemia    Hypertension    Long term current use of immunosuppressive drug    a.) MTX + secukinumab for severe psoriatic arthritis   Lumbar radiculitis    Mallory-Weiss tear 11/2015   Migraine aura without headache    OSA (obstructive sleep apnea)    a.) does not use nocturnal PAP therapy   Psoriasis    Psoriatic arthritis ([place]    a.) severe; Tx'd with MTX + secukinumab   PVC (premature ventricular contraction)    Schatzki's ring    Type II diabetes mellitus ([place]    Past Surgical History:  Procedure Laterality Date   ANTERIOR CERVICAL DECOMP/DISCECTOMY FUSION N/A 04/13/2021   Procedure: C3-5 ANTERIOR CERVICAL DISCECTOMY AND FUSION (GLOBUS ALLOGRAFT);  Surgeon: YMeade Maw MD;  Location: [facility] ORS;  Service: Neurosurgery;  Laterality: N/A;   ARTHROPLASTY     right thumb; left thumb with 2 screws and plastic joint   BALLOON DILATION N/A 05/29/2015   Procedure: BALLOON DILATION;  Surgeon: MLollie Sails MD;  [facility]  Service: Endoscopy;  Laterality: N/A;   BALLOON DILATION N/A 02/14/2017   Procedure:  BALLOON DILATION;  Surgeon: Lollie Sails, MD;  Location: [facility] END[facility];  Service: Endoscopy;  Laterality: N/A;   BILATERAL CARPAL TUNNEL RELEASE Bilateral 07/04/2017   Procedure: BILATERAL CARPAL TUNNEL RELEASE;  Surgeon: Leanor Kail, MD;  Location: [facility] ORS;  Service: Orthopedics;  Laterality: Bilateral;   BLEPHAROPLASTY     BUNIONECTOMY Left 12/2020   CARDIAC CATHETERIZATION     1 stent    CARPAL TUNNEL RELEASE  2005   right   CHOLECYSTECTOMY  2008   COLONOSCOPY     COLONOSCOPY WITH PROPOFOL N/A 02/13/2018   Procedure: COLONOSCOPY WITH PROPOFOL;  Surgeon: Lollie Sails, MD;  Location: [facility] ENDOSCOPY;  Service: Endoscopy;  Laterality: N/A;   CORONARY STENT INTERVENTION N/A  06/14/2016   Procedure: Coronary Stent Intervention;  Surgeon: Wellington Hampshire, MD;  Location: [place] CV LAB;  Service: Cardiovascular;  Laterality: N/A;   CYST EXCISION  04/14/2015   tendon sheath cyst excision; right ring finger cyst removed and trigger finger realease   ESOPHAGOGASTRODUODENOSCOPY     ESOPHAGOGASTRODUODENOSCOPY (EGD) WITH PROPOFOL N/A 05/29/2015   Procedure: ESOPHAGOGASTRODUODENOSCOPY (EGD) WITH PROPOFOL;  Surgeon: Lollie Sails, MD;  Location: [facility] ENDOSCOPY;  Service: Endoscopy;  Laterality: N/A;   ESOPHAGOGASTRODUODENOSCOPY (EGD) WITH PROPOFOL N/A 07/04/2015   Procedure: ESOPHAGOGASTRODUODENOSCOPY (EGD) WITH PROPOFOL;  Surgeon: Lollie Sails, MD;  Location: [facility] ENDOSCOPY;  Service: Endoscopy;  Laterality: N/A;   ESOPHAGOGASTRODUODENOSCOPY (EGD) WITH PROPOFOL N/A 01/04/2016   Procedure: ESOPHAGOGASTRODUODENOSCOPY (EGD) WITH PROPOFOL;  Surgeon: Lollie Sails, MD;  Location: [facility] ENDOSCOPY;  Service: Endoscopy;  Laterality: N/A;   ESOPHAGOGASTRODUODENOSCOPY (EGD) WITH PROPOFOL N/A 03/22/2016   Procedure: ESOPHAGOGASTRODUODENOSCOPY (EGD) WITH PROPOFOL;  Surgeon: Lollie Sails, MD;  Location: [facility] ENDOSCOPY;  Service: Endoscopy;  Laterality: N/A;   ESOPHAGOGASTRODUODENOSCOPY (EGD) WITH PROPOFOL N/A 02/14/2017   Procedure: ESOPHAGOGASTRODUODENOSCOPY (EGD) WITH PROPOFOL;  Surgeon: Lollie Sails, MD;  Location: [facility] ENDOSCOPY;  Service: Endoscopy;  Laterality: N/A;   ESOPHAGOGASTRODUODENOSCOPY (EGD) WITH PROPOFOL N/A 04/11/2017   Procedure: ESOPHAGOGASTRODUODENOSCOPY (EGD) WITH PROPOFOL;  Surgeon: Lollie Sails, MD;  Location: [facility] ENDOSCOPY;  Service: Endoscopy;  Laterality: N/A;   ESOPHAGOGASTRODUODENOSCOPY (EGD) WITH PROPOFOL N/A 07/07/2017   Procedure: ESOPHAGOGASTRODUODENOSCOPY (EGD) WITH PROPOFOL;  Surgeon: Lucilla Lame, MD;  Location: [facility] ENDOSCOPY;  Service: Endoscopy;  Laterality: N/A;   ESOPHAGOGASTRODUODENOSCOPY (EGD) WITH PROPOFOL N/A  02/13/2018   Procedure: ESOPHAGOGASTRODUODENOSCOPY (EGD) WITH PROPOFOL;  Surgeon: Lollie Sails, MD;  Location: [facility] ENDOSCOPY;  Service: Endoscopy;  Laterality: N/A;   FOREIGN BODY REMOVAL N/A 12/03/2015   Procedure: FOREIGN BODY REMOVAL;  Surgeon: Wilford Corner, MD;  Location: [facility] ENDOSCOPY;  Service: Endoscopy;  Laterality: N/A;   HERNIA REPAIR Right 1970   inguinal   INGUINAL HERNIA REPAIR Left 01/01/2018   Procedure: OPEN HERNIA REPAIR INGUINAL ADULT;  Surgeon: Olean Ree, MD;  Location: [facility] ORS;  Service: General;  Laterality: Left;   LEFT HEART CATH Left 06/14/2016   Procedure: Left Heart Cath;  Surgeon: Wellington Hampshire, MD;  Location: [place] CV LAB;  Service: Cardiovascular;  Laterality: Left;   LEFT HEART CATH AND CORONARY ANGIOGRAPHY Left 09/14/2018   Procedure: LEFT HEART CATH AND CORONARY ANGIOGRAPHY;  Surgeon: Wellington Hampshire, MD;  Location: [place] CV LAB;  Service: Cardiovascular;  Laterality: Left;   LUMBAR [place] SURGERY  2004   injections; herniated disc; percutaneous discectomy   NASAL SINUS SURGERY  2008   ROTATOR CUFF REPAIR Right    TONSILLECTOMY     Family History  Problem Relation Age  of Onset   Lung cancer Father    Cancer Father    Hypertension Mother    Aneurysm Son    Social History   Socioeconomic History   Marital status: Married    Spouse name: Pamala Hurry   Number of children: Not on file   Years of education: Not on file   Highest education level: Not on file  Occupational History    Comment: not heavy work  Tobacco Use   Smoking status: Former    Packs/day: 0.25    Years: 7.00    Total pack years: 1.75    Types: Cigarettes    Quit date: 07/06/1982    Years since quitting: 39.6    Passive exposure: Past   Smokeless tobacco: Never  Vaping Use   Vaping Use: Never used  Substance and Sexual Activity   Alcohol use: Not Currently   Drug use: No   Sexual activity: Not on file  Other Topics Concern   Not on file   Social History Narrative   Lives locally with wife.  Active but does not routinely exercise.   Social Determinants of Health   Financial Resource Strain: Low Risk  (02/15/2021)   Overall Financial Resource Strain (CARDIA)    Difficulty of Paying Living Expenses: Not hard at all  Food Insecurity: No Food Insecurity (02/15/2021)   Hunger Vital Sign    Worried About Running Out of Food in the Last Year: Never true    Ran Out of Food in the Last Year: Never true  Transportation Needs: No Transportation Needs (02/15/2021)   PRAPARE - Hydrologist (Medical): No    Lack of Transportation (Non-Medical): No  Physical Activity: Not on file  Stress: No Stress Concern Present (02/15/2021)   [place]    Feeling of Stress : Not at all  Social Connections: Unknown (02/15/2021)   Social Connection and Isolation Panel [NHANES]    Frequency of Communication with Friends and Family: Not on file    Frequency of Social Gatherings with Friends and Family: Not on file    Attends Religious Services: Not on file    Active Member of Clubs or Organizations: Not on file    Attends Archivist Meetings: Not on file    Marital Status: Married     Review of Systems  Constitutional:  Negative for appetite change and unexpected weight change.  HENT:  Negative for congestion and sinus pressure.   Respiratory:  Negative for cough, chest tightness and shortness of breath.   Cardiovascular:  Negative for chest pain, palpitations and leg swelling.  Gastrointestinal:  Negative for abdominal pain, diarrhea, nausea and vomiting.  Genitourinary:  Positive for frequency and urgency.  Musculoskeletal:  Negative for joint swelling and myalgias.  Skin:  Negative for color change and rash.  Neurological:  Negative for dizziness and headaches.  Psychiatric/Behavioral:  Negative for agitation and dysphoric mood.         Objective:     BP 118/70   Pulse (!) 59   Temp 97.9 F (36.6 C)   Resp 16   Ht 5' 8.5" (1.74 m)   Wt 170 lb 6.4 oz (77.3 kg)   SpO2 97%   BMI 25.53 kg/m  Wt Readings from Last 3 Encounters:  02/14/22 170 lb 6.4 oz (77.3 kg)  02/05/22 170 lb (77.1 kg)  01/15/22 167 lb 6.4 oz (75.9 kg)    Physical Exam Vitals  reviewed.  Constitutional:      General: He is not in acute distress.    Appearance: Normal appearance. He is well-developed.  HENT:     Head: Normocephalic and atraumatic.     Right Ear: External ear normal.     Left Ear: External ear normal.  Eyes:     General: No scleral icterus.       Right eye: No discharge.        Left eye: No discharge.     Conjunctiva/sclera: Conjunctivae normal.  Cardiovascular:     Rate and Rhythm: Normal rate and regular rhythm.  Pulmonary:     Effort: Pulmonary effort is normal. No respiratory distress.     Breath sounds: Normal breath sounds.  Abdominal:     General: Bowel sounds are normal.     Palpations: Abdomen is soft.     Tenderness: There is no abdominal tenderness.  Musculoskeletal:        General: No swelling or tenderness.     Cervical back: Neck supple. No tenderness.  Lymphadenopathy:     Cervical: No cervical adenopathy.  Skin:    Findings: No erythema or rash.  Neurological:     Mental Status: He is alert.  Psychiatric:        Mood and Affect: Mood normal.        Behavior: Behavior normal.      Outpatient Encounter Medications as of 02/14/2022  Medication Sig   doxycycline (VIBRA-TABS) 100 MG tablet Take 1 tablet (100 mg total) by mouth 2 (two) times daily.   glipiZIDE (GLUCOTROL XL) 2.5 MG 24 hr tablet Take 1 tablet (2.5 mg total) by mouth daily with breakfast.   acetaminophen (TYLENOL) 500 MG tablet Take 1,000 mg by mouth daily as needed for moderate pain.   aspirin EC 81 MG tablet Take 81 mg by mouth every evening.    atenolol (TENORMIN) 50 MG tablet TAKE 1 TABLET BY MOUTH  TWICE DAILY   Fe  Fum-FePoly-Vit C-Vit B3 (INTEGRA) 62.5-62.5-40-3 MG CAPS One capsule daily.   folic acid (FOLVITE) 1 MG tablet Take 1 mg by mouth daily.   gabapentin (NEURONTIN) 300 MG capsule TAKE 1 TO 2 CAPSULES BY  MOUTH AT BEDTIME (Patient taking differently: TAKE 1 TO 2 CAPSULES BY  MOUTH AT BEDTIME)   glucose blood (ONETOUCH ULTRA) test strip Used to check blood glucose one time per day.   isosorbide mononitrate (IMDUR) 30 MG 24 hr tablet TAKE ONE-HALF TABLET BY MOUTH  DAILY   losartan (COZAAR) 25 MG tablet TAKE ONE-HALF TABLET BY  MOUTH DAILY (Patient taking differently: Take 12.5 mg by mouth every morning.)   melatonin 5 MG TABS Take 5 mg by mouth at bedtime as needed.   methocarbamol (ROBAXIN) 500 MG tablet Take 1 tablet (500 mg total) by mouth every 6 (six) hours as needed for muscle spasms.   methotrexate (RHEUMATREX) 2.5 MG tablet Take 2.5 mg by mouth daily.   nitroGLYCERIN (NITROSTAT) 0.4 MG SL tablet Place 1 tablet (0.4 mg total) under the tongue every 5 (five) minutes as needed. MAXIMUM OF 3 DOSES.   pantoprazole (PROTONIX) 40 MG tablet TAKE 1 TABLET BY MOUTH TWICE  DAILY   rosuvastatin (CRESTOR) 20 MG tablet Take 1 tablet (20 mg total) by mouth daily.   senna (SENOKOT) 8.6 MG TABS tablet Take 1 tablet (8.6 mg total) by mouth 2 (two) times daily.   sodium chloride (OCEAN) 0.65 % SOLN nasal spray Place 1 spray into both nostrils as  needed for congestion.   SYRINGE-NEEDLE, DISP, 3 ML 25G X 5/8" 3 ML MISC Use as instructed with B12 injection.   vitamin E 400 UNIT capsule Take 400 Units by mouth in the morning.   [DISCONTINUED] chlorpheniramine-HYDROcodone (TUSSIONEX) 10-8 MG/5ML Take 5 mLs by mouth at bedtime as needed for cough.   [DISCONTINUED] empagliflozin (JARDIANCE) 10 MG TABS tablet Take 1 tablet (10 mg total) by mouth daily before breakfast.   [DISCONTINUED] oxybutynin (DITROPAN-XL) 10 MG 24 hr tablet Take 1 tablet (10 mg total) by mouth daily.   No facility-administered encounter medications  on file as of 02/14/2022.     Lab Results  Component Value Date   WBC 6.1 02/06/2022   HGB 11.8 (L) 02/06/2022   HCT 35.5 (L) 02/06/2022   PLT 227.0 02/06/2022   GLUCOSE 193 (H) 02/06/2022   CHOL 96 02/06/2022   TRIG 111.0 02/06/2022   HDL 33.40 (L) 02/06/2022   LDLCALC 41 02/06/2022   ALT 12 02/06/2022   AST 13 02/06/2022   NA 140 02/06/2022   K 4.1 02/06/2022   CL 105 02/06/2022   CREATININE 0.78 02/06/2022   BUN 14 02/06/2022   CO2 25 02/06/2022   TSH 1.26 11/05/2021   PSA 2.77 11/05/2021   INR 1.1 06/07/2016   HGBA1C 7.5 (H) 02/06/2022   MICROALBUR 20.2 (H) 12/17/2019    DG Cervical Spine 2 or 3 views  Result Date: 01/16/2022 CLINICAL DATA:  Cervical spine fusion 04/13/2021. Pain right-sided neck. EXAM: CERVICAL SPINE - 2-3 VIEW COMPARISON:  Cervical spine 04/13/2021. MRI cervical spine 03/27/2021. FINDINGS: Prior C3 through C5 anterior and interbody fusion. Hardware intact. Diffuse osteopenia and degenerative change. 2 mm anterolisthesis C2 on C3. No acute bony abnormality identified. Ligamentous ossification. Tiny bony density noted over the left anterior first rib, most likely a benign bone island. Pulmonary apices are clear. IMPRESSION: 1. Prior C3 through C5 anterior and interbody fusion. Hardware intact. 2. Diffuse osteopenia degenerative change. 2 mm anterolisthesis C2 on C3. No acute bony abnormality identified. Electronically Signed   By: Marcello Moores  Register M.D.   On: 01/16/2022 08:32       Assessment & Plan:  Primary hypertension Assessment & Plan: Continue losartan, atenolol and imdur.  Follow pressures.  Follow metabolic panel.   Orders: -     Basic metabolic panel; Future  Hyperlipidemia, unspecified hyperlipidemia type Assessment & Plan: Continue crestor.  Low cholesterol diet and exercise.  Follow lipid panel and liver function tests.    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  Dysphagia.  Felt was related to metformin.  Also had itching. Off metformin now.  Swallowing is some better, but still an issue.  Avoid GLP-1 agonist.  Had started him on jardiance.  Having issues with urinary urgency and frequency.  Stop jardiance.  Will try glipizide XL 2.'5mg'$  q day.  Follow sugars. Discussed low sugars. Follow met b and a1c.   Orders: -     Hemoglobin A1c; Future -     Microalbumin / creatinine urine ratio; Future  Abnormal liver function tests Assessment & Plan: Diet and exercise.  Better sugar control. Follow liver function tests.    Anemia, unspecified type Assessment & Plan: Being followed by GI.  S/p recent EGD.  Colonoscopy 2020.  EGD 2021.  On protonix. Follow cbc and iron studies.  Has been taking iron supplements.  Has had problems tolerating.  Tried integra.  Still had problems.  Request referral to hematology.    Orders: -     Ambulatory referral to Hematology / Oncology  Bilateral carotid artery disease, unspecified type [facility]) Assessment & Plan: 04/17/2020 carotid showed 40 to 59% narrowing of right and left carotid arteries.  Left carotid artery was unchanged; however, the right carotid artery has progressed.  Followed by cardiology.  Continue statin medication and risk factor modification.    Chronic systolic heart failure (HCC) Assessment & Plan: No evidence of volume overload on exam.  Continue imdur, beta blocker and losartan.  Stable.    Coronary artery disease involving native coronary artery of native heart with angina pectoris [facility]) Assessment & Plan: 09/14/2018 cardiac cath showed patent distal RCA stent with no significant restenosis. Stable moderate disease in the mid LAD and proximal right coronary artery.  OM1 now completely occluded.  It was 99% stenosed before  but overall a small branch.  Per note, the only difference noted from most recent angiography was progression of distal RCA disease just proximal to the stent and a tortuous segment causing about 60% stenosis.  It was also noted that he had normal LV systolic function with mildly elevated left ventricular end-diastolic pressure.  recent chest pain. On imdur.  No further chest pain as outlined.  Tolerating '30mg'$  imdur.  Continue statin and risk factor modfication.     Esophageal dysphagia Assessment & Plan: Is some better.  Has adjusted diet.  Follow. Seeing GI.    Gastroesophageal reflux disease, unspecified whether esophagitis present Assessment & Plan: Continue protonix.  Acid reflux appears to be controlled.    History of colonic polyps Assessment & Plan: Colonoscopy 01/2018 - diverticulosis and internal hemorrhoids.    Psoriatic arthritis ([place]) Assessment & Plan: Followed by rheumatology.  Consentyx/MTX/gabapentin.  Stable.    Urinary frequency Assessment & Plan:  Dr Diamantina Providence (urology) - 12/25/21 Flomax discontinued, trial of oxybutynin 10 mg XL daily Behavioral strategies discussed RTC 6 weeks PVR Stop jardiance as outlined.    Other orders -     glipiZIDE ER; Take 1 tablet (2.5 mg total) by mouth daily with breakfast.  Dispense: 30 tablet; Refill: 2 -     Doxycycline Hyclate; Take 1 tablet (100 mg total) by mouth 2 (two) times daily.  Dispense: 14 tablet; Refill: 0     Einar Pheasant, MD

## 2022-02-17 ENCOUNTER — Encounter: Payer: Self-pay | Admitting: Internal Medicine

## 2022-02-17 NOTE — Assessment & Plan Note (Signed)
09/14/2018 cardiac cath showed patent distal RCA stent with no significant restenosis. Stable moderate disease in the mid LAD and proximal right coronary artery.  OM1 now completely occluded.  It was 99% stenosed before but overall a small branch.  Per note, the only difference noted from most recent angiography was progression of distal RCA disease just proximal to the stent and a tortuous segment causing about 60% stenosis.  It was also noted that he had normal LV systolic function with mildly elevated left ventricular end-diastolic pressure.  recent chest pain. On imdur.  No further chest pain as outlined.  Tolerating '30mg'$  imdur.  Continue statin and risk factor modfication.

## 2022-02-17 NOTE — Assessment & Plan Note (Signed)
No evidence of volume overload on exam.  Continue imdur, beta blocker and losartan.  Stable.

## 2022-02-17 NOTE — Assessment & Plan Note (Signed)
Diet and exercise.  Better sugar control. Follow liver function tests.

## 2022-02-17 NOTE — Assessment & Plan Note (Signed)
Continue protonix.  Acid reflux appears to be controlled.

## 2022-02-17 NOTE — Assessment & Plan Note (Signed)
Continue losartan, atenolol and imdur.  Follow pressures.  Follow metabolic panel.

## 2022-02-17 NOTE — Assessment & Plan Note (Signed)
Being followed by GI.  S/p recent EGD.  Colonoscopy 2020.  EGD 2021.  On protonix. Follow cbc and iron studies.  Has been taking iron supplements.  Has had problems tolerating.  Tried integra.  Still had problems.  Request referral to hematology.

## 2022-02-17 NOTE — Assessment & Plan Note (Signed)
Followed by rheumatology.  Consentyx/MTX/gabapentin.  Stable.

## 2022-02-17 NOTE — Assessment & Plan Note (Signed)
Colonoscopy 01/2018 - diverticulosis and internal hemorrhoids.

## 2022-02-17 NOTE — Assessment & Plan Note (Addendum)
Low carb diet and exercise.  Dysphagia.  Felt was related to metformin.  Also had itching. Off metformin now.  Swallowing is some better, but still an issue.  Avoid GLP-1 agonist.  Had started him on jardiance.  Having issues with urinary urgency and frequency.  Stop jardiance.  Will try glipizide XL 2.'5mg'$  q day.  Follow sugars. Discussed low sugars. Follow met b and a1c.

## 2022-02-17 NOTE — Assessment & Plan Note (Signed)
Dr Diamantina Providence (urology) - 12/25/21 Flomax discontinued, trial of oxybutynin 10 mg XL daily Behavioral strategies discussed RTC 6 weeks PVR Stop jardiance as outlined.

## 2022-02-17 NOTE — Assessment & Plan Note (Signed)
Is some better.  Has adjusted diet.  Follow. Seeing GI.

## 2022-02-17 NOTE — Assessment & Plan Note (Signed)
Continue crestor.  Low cholesterol diet and exercise. Follow lipid panel and liver function tests.   

## 2022-02-17 NOTE — Assessment & Plan Note (Signed)
04/17/2020 carotid showed 40 to 59% narrowing of right and left carotid arteries.  Left carotid artery was unchanged; however, the right carotid artery has progressed.  Followed by cardiology.  Continue statin medication and risk factor modification.

## 2022-02-22 ENCOUNTER — Telehealth: Payer: Self-pay

## 2022-02-22 NOTE — Telephone Encounter (Signed)
Courtesy call made to patient. Informed of cancer center location, mask policy and visitor policy. All pt questions answered.

## 2022-02-25 ENCOUNTER — Inpatient Hospital Stay: Payer: Medicare Other | Attending: Oncology | Admitting: Oncology

## 2022-02-25 ENCOUNTER — Inpatient Hospital Stay: Payer: Medicare Other

## 2022-02-25 ENCOUNTER — Encounter: Payer: Self-pay | Admitting: Oncology

## 2022-02-25 VITALS — BP 154/87 | HR 70 | Temp 97.8°F | Resp 16 | Ht 68.0 in | Wt 177.0 lb

## 2022-02-25 DIAGNOSIS — L4050 Arthropathic psoriasis, unspecified: Secondary | ICD-10-CM | POA: Diagnosis not present

## 2022-02-25 DIAGNOSIS — K222 Esophageal obstruction: Secondary | ICD-10-CM

## 2022-02-25 DIAGNOSIS — Z8719 Personal history of other diseases of the digestive system: Secondary | ICD-10-CM

## 2022-02-25 DIAGNOSIS — Z87891 Personal history of nicotine dependence: Secondary | ICD-10-CM

## 2022-02-25 DIAGNOSIS — Z79899 Other long term (current) drug therapy: Secondary | ICD-10-CM

## 2022-02-25 DIAGNOSIS — Z9049 Acquired absence of other specified parts of digestive tract: Secondary | ICD-10-CM

## 2022-02-25 DIAGNOSIS — Z880 Allergy status to penicillin: Secondary | ICD-10-CM

## 2022-02-25 DIAGNOSIS — Z8249 Family history of ischemic heart disease and other diseases of the circulatory system: Secondary | ICD-10-CM

## 2022-02-25 DIAGNOSIS — Z801 Family history of malignant neoplasm of trachea, bronchus and lung: Secondary | ICD-10-CM | POA: Diagnosis not present

## 2022-02-25 DIAGNOSIS — I2510 Atherosclerotic heart disease of native coronary artery without angina pectoris: Secondary | ICD-10-CM

## 2022-02-25 DIAGNOSIS — Z881 Allergy status to other antibiotic agents status: Secondary | ICD-10-CM

## 2022-02-25 DIAGNOSIS — Z809 Family history of malignant neoplasm, unspecified: Secondary | ICD-10-CM | POA: Diagnosis not present

## 2022-02-25 DIAGNOSIS — I10 Essential (primary) hypertension: Secondary | ICD-10-CM | POA: Diagnosis not present

## 2022-02-25 DIAGNOSIS — D509 Iron deficiency anemia, unspecified: Secondary | ICD-10-CM

## 2022-02-25 DIAGNOSIS — E119 Type 2 diabetes mellitus without complications: Secondary | ICD-10-CM

## 2022-02-25 DIAGNOSIS — Z886 Allergy status to analgesic agent status: Secondary | ICD-10-CM | POA: Diagnosis not present

## 2022-02-25 DIAGNOSIS — Z8616 Personal history of COVID-19: Secondary | ICD-10-CM | POA: Diagnosis not present

## 2022-02-25 DIAGNOSIS — R1310 Dysphagia, unspecified: Secondary | ICD-10-CM

## 2022-02-25 DIAGNOSIS — D508 Other iron deficiency anemias: Secondary | ICD-10-CM

## 2022-02-25 DIAGNOSIS — E538 Deficiency of other specified B group vitamins: Secondary | ICD-10-CM

## 2022-02-25 NOTE — Assessment & Plan Note (Signed)
Due to history of esophageal stricture.  Recommend patient to continue follow-up with gastroenterology

## 2022-02-25 NOTE — Progress Notes (Signed)
New pt referred by Dr Nicki Reaper for anemia. Pt is unable to take oral iron.

## 2022-02-25 NOTE — Progress Notes (Signed)
Hematology/Oncology Consult note [phone] [phone]      Patient Care Team: Einar Pheasant, MD as PCP - General (Internal Medicine) Wellington Hampshire, MD as PCP - Cardiology (Cardiology)   REFERRING PROVIDER: Einar Pheasant, MD  CHIEF COMPLAINTS/REASON FOR VISIT:  Anemia  ASSESSMENT & PLAN:  Iron deficiency anemia His previous labs are consistent with iron deficiency anemia. I discussed about option of continue oral iron supplementation and repeat blood work for evaluation of treatment response.  If no significant improvement, then proceed with IV Venofer treatments. Alternative option of proceed with IV Venofer treatments. I discussed about the potential risks including but not limited to allergic reactions/infusion reactions including anaphylactic reactions, phlebitis, high blood pressure, wheezing, SOB, skin rash, weight gain,dark urine, leg swelling, back pain, headache, nausea and fatigue, etc. Patient tolerates oral iron supplement poorly and is interested in proceeding with IV Venofer treatments.. Plan IV venofer weekly x 3   Dysphagia Due to history of esophageal stricture.  Recommend patient to continue follow-up with gastroenterology  Orders Placed This Encounter  Procedures   CBC with Differential    Standing Status:   Future    Standing Expiration Date:   02/25/2023   Iron and TIBC([facility])    Standing Status:   Future    Standing Expiration Date:   02/26/2023   Ferritin    Standing Status:   Future    Standing Expiration Date:   02/26/2023   Retic Panel    Standing Status:   Future    Standing Expiration Date:   02/26/2023   Vitamin B12    Standing Status:   Future    Standing Expiration Date:   02/26/2023   Folate    Standing Status:   Future    Standing Expiration Date:   02/26/2023   Follow-up in 3 months. All questions were answered. The patient knows to call the clinic with any problems, questions or concerns.  Earlie Server, MD, PhD [facility] Hematology Oncology 02/25/2022     HISTORY OF PRESENTING ILLNESS:  Shane Mcknight is a  72 y.o.  male with PMH listed below who was referred to me for anemia Reviewed patient's recent labs that was done.  Patient has chronic iron deficiency, onset of symptoms since at least February 2021. 11/06/2021, ferritin 9.9 02/06/2021, ferritin 70.3, iron saturation 46, TIBC 308.  Hemoglobin 11.8, Patient has history of gastritis.  He has previously seen gastroenterology at [facility] for dysphagia/esophageal stricture.  He has not followed up closely recently.  Intermittently he has "hemorrhoid bleeding" Patient has psoriatic arthritis  he is on methotrexate 20 mg weekly. Patient is on aspirin 81 mg daily.  MEDICAL HISTORY:  Past Medical History:  Diagnosis Date   Allergic state    Anemia    Aortic atherosclerosis (HCC)    Arthritis    spine, hands, shoulder with previous cuff tear   B12 deficiency    CAD (coronary artery disease)    a.) 11/2015 CT: Coronary Ca2+ noted. b.) LHC 06/14/2016: 60% pRCA, 60% RPLB-2, 99% OM1, 50% mLAD, 40% oLM-LM, 30% pLAD, 60% oD1-D1, 60% oLCx, 95% dRCA; PCI placing a 2.5 x 45m Resolute Onyx DES dRCA. c.) LHC 09/14/2018: 40% oLM-LM, 50% mLAD, 30% pLAD, 60% oD1-D1, 40% oLCx, 100% OM1, 60% p-mRCA, 50% RPAV; dRCA stent widely patent; medical mgmt.   Candidiasis of esophagus (HCC)    Carotid artery stenosis 04/17/2020   a.) Carotid doppler [id number]/LICA stenosis.   Colon polyp  COVID-19    09/2021   DDD (degenerative disc disease), lumbosacral    Diastolic dysfunction [id number]   a.)  TTE 04/06/2020; EF 55-60%; mild LA dilation; no RWMAs; G2DD.   Dysphagia    Effort angina    Elevated PSA    Elevated transaminase level    Esophageal stricture    a.) s/p multiple dilations   Esophagitis, [place] grade D    Fatty liver    Ganglion cyst of finger of right hand    GERD (gastroesophageal reflux disease)    Guaiac positive  stools    Hiatal hernia    HNP (herniated nucleus pulposus), lumbar    Hypercholesterolemia    Hypertension    Long term current use of immunosuppressive drug    a.) MTX + secukinumab for severe psoriatic arthritis   Lumbar radiculitis    Mallory-Weiss tear 11/2015   Migraine aura without headache    OSA (obstructive sleep apnea)    a.) does not use nocturnal PAP therapy   Psoriasis    Psoriatic arthritis ([place])    a.) severe; Tx'd with MTX + secukinumab   PVC (premature ventricular contraction)    Schatzki's ring    Type II diabetes mellitus ([place])     SURGICAL HISTORY: Past Surgical History:  Procedure Laterality Date   ANTERIOR CERVICAL DECOMP/DISCECTOMY FUSION N/A 04/13/2021   Procedure: C3-5 ANTERIOR CERVICAL DISCECTOMY AND FUSION (GLOBUS ALLOGRAFT);  Surgeon: Meade Maw, MD;  Location: [facility] ORS;  Service: Neurosurgery;  Laterality: N/A;   ARTHROPLASTY     right thumb; left thumb with 2 screws and plastic joint   BALLOON DILATION N/A 05/29/2015   Procedure: BALLOON DILATION;  Surgeon: Lollie Sails, MD;  Location: [facility] ENDOSCOPY;  Service: Endoscopy;  Laterality: N/A;   BALLOON DILATION N/A 02/14/2017   Procedure: BALLOON DILATION;  Surgeon: Lollie Sails, MD;  Location: [facility] ENDOSCOPY;  Service: Endoscopy;  Laterality: N/A;   BILATERAL CARPAL TUNNEL RELEASE Bilateral 07/04/2017   Procedure: BILATERAL CARPAL TUNNEL RELEASE;  Surgeon: Leanor Kail, MD;  Location: [facility] ORS;  Service: Orthopedics;  Laterality: Bilateral;   BLEPHAROPLASTY     BUNIONECTOMY Left 12/2020   CARDIAC CATHETERIZATION     1 stent    CARPAL TUNNEL RELEASE  2005   right   CHOLECYSTECTOMY  2008   COLONOSCOPY     COLONOSCOPY WITH PROPOFOL N/A 02/13/2018   Procedure: COLONOSCOPY WITH PROPOFOL;  Surgeon: Lollie Sails, MD;  Location[facility] ENDOSCOPY;  Service: Endoscopy;  Laterality: N/A;   CORONARY STENT INTERVENTION N/A 06/14/2016   Procedure: Coronary Stent Intervention;   Surgeon: Wellington Hampshire, MD;  Location: [facility];  Service: Cardiovascular;  Laterality: N/A;   CYST EXCISION  04/14/2015   tendon sheath cyst excision; right ring finger cyst removed and trigger finger realease   ESOPHAGOGASTRODUODENOSCOPY     ESOPHAGOGASTRODUODENOSCOPY (EGD) WITH PROPOFOL N/A 05/29/2015   Procedure: ESOPHAGOGASTRODUODENOSCOPY (EGD) WITH PROPOFOL;  Surgeon: Lollie Sails, MD;  Location: [facility] ENDOSCOPY;  Service: Endoscopy;  Laterality: N/A;   ESOPHAGOGASTRODUODENOSCOPY (EGD) WITH PROPOFOL N/A 07/04/2015   Procedure: ESOPHAGOGASTRODUODENOSCOPY (EGD) WITH PROPOFOL;  Surgeon: Lollie Sails, MD;  Location: [facility] ENDOSCOPY;  Service: Endoscopy;  Laterality: N/A;   ESOPHAGOGASTRODUODENOSCOPY (EGD) WITH PROPOFOL N/A 01/04/2016   Procedure: ESOPHAGOGASTRODUODENOSCOPY (EGD) WITH PROPOFOL;  Surgeon: Lollie Sails, MD;  Location: [facility] ENDOSCOPY;  Service: Endoscopy;  Laterality: N/A;   ESOPHAGOGASTRODUODENOSCOPY (EGD) WITH PROPOFOL N/A 03/22/2016   Procedure: ESOPHAGOGASTRODUODENOSCOPY (EGD) WITH PROPOFOL;  Surgeon: Billie Ruddy  Gustavo Lah, MD;  Location: [facility] ENDOSCOPY;  Service: Endoscopy;  Laterality: N/A;   ESOPHAGOGASTRODUODENOSCOPY (EGD) WITH PROPOFOL N/A 02/14/2017   Procedure: ESOPHAGOGASTRODUODENOSCOPY (EGD) WITH PROPOFOL;  Surgeon: Lollie Sails, MD;  Location: [facility] ENDOSCOPY;  Service: Endoscopy;  Laterality: N/A;   ESOPHAGOGASTRODUODENOSCOPY (EGD) WITH PROPOFOL N/A 04/11/2017   Procedure: ESOPHAGOGASTRODUODENOSCOPY (EGD) WITH PROPOFOL;  Surgeon: Lollie Sails, MD;  Location: [facility] ENDOSCOPY;  Service: Endoscopy;  Laterality: N/A;   ESOPHAGOGASTRODUODENOSCOPY (EGD) WITH PROPOFOL N/A 07/07/2017   Procedure: ESOPHAGOGASTRODUODENOSCOPY (EGD) WITH PROPOFOL;  Surgeon: Lucilla Lame, MD;  Location: [facility] ENDOSCOPY;  Service: Endoscopy;  Laterality: N/A;   ESOPHAGOGASTRODUODENOSCOPY (EGD) WITH PROPOFOL N/A 02/13/2018   Procedure: ESOPHAGOGASTRODUODENOSCOPY  (EGD) WITH PROPOFOL;  Surgeon: Lollie Sails, MD;  Location: [facility] ENDOSCOPY;  Service: Endoscopy;  Laterality: N/A;   FOREIGN BODY REMOVAL N/A 12/03/2015   Procedure: FOREIGN BODY REMOVAL;  Surgeon: Wilford Corner, MD;  Location: [facility] ENDOSCOPY;  Service: Endoscopy;  Laterality: N/A;   HERNIA REPAIR Right 1970   inguinal   INGUINAL HERNIA REPAIR Left 01/01/2018   Procedure: OPEN HERNIA REPAIR INGUINAL ADULT;  Surgeon: Olean Ree, MD;  Location: [facility] ORS;  Service: General;  Laterality: Left;   LEFT HEART CATH Left 06/14/2016   Procedure: Left Heart Cath;  Surgeon: Wellington Hampshire, MD;  Location: [place] [facility];  Service: Cardiovascular;  Laterality: Left;   LEFT HEART CATH AND CORONARY ANGIOGRAPHY Left 09/14/2018   Procedure: LEFT HEART CATH AND CORONARY ANGIOGRAPHY;  Surgeon: Wellington Hampshire, MD;  Location: [place] [facility];  Service: Cardiovascular;  Laterality: Left;   LUMBAR DISC SURGERY  2004   injections; herniated disc; percutaneous discectomy   NASAL SINUS SURGERY  2008   ROTATOR CUFF REPAIR Right    TONSILLECTOMY      SOCIAL HISTORY: Social History   Socioeconomic History   Marital status: Married    Spouse name: Pamala Hurry   Number of children: Not on file   Years of education: Not on file   Highest education level: Not on file  Occupational History    Comment: not heavy work  Tobacco Use   Smoking status: Former    Packs/day: 0.25    Years: 7.00    Total pack years: 1.75    Types: Cigarettes    Quit date: 07/06/1982    Years since quitting: 39.6    Passive exposure: Past   Smokeless tobacco: Never  Vaping Use   Vaping Use: Never used  Substance and Sexual Activity   Alcohol use: Not Currently   Drug use: No   Sexual activity: Not on file  Other Topics Concern   Not on file  Social History Narrative   Lives locally with wife.  Active but does not routinely exercise.   Social Determinants of Health   Financial Resource Strain: Low Risk   (02/25/2022)   Overall Financial Resource Strain (CARDIA)    Difficulty of Paying Living Expenses: Not hard at all  Food Insecurity: No Food Insecurity (02/25/2022)   Hunger Vital Sign    Worried About Running Out of Food in the Last Year: Never true    Ran Out of Food in the Last Year: Never true  Transportation Needs: No Transportation Needs (02/25/2022)   PRAPARE - Hydrologist (Medical): No    Lack of Transportation (Non-Medical): No  Physical Activity: Not on file  Stress: No Stress Concern Present (02/25/2022)   [place]  Feeling of Stress : Not at all  Social Connections: Unknown (02/15/2021)   Social Connection and Isolation Panel [NHANES]    Frequency of Communication with Friends and Family: Not on file    Frequency of Social Gatherings with Friends and Family: Not on file    Attends Religious Services: Not on file    Active Member of Clubs or Organizations: Not on file    Attends Archivist Meetings: Not on file    Marital Status: Married  Intimate Partner Violence: Not At Risk (02/25/2022)   Humiliation, Afraid, Rape, and Kick questionnaire    Fear of Current or Ex-Partner: No    Emotionally Abused: No    Physically Abused: No    Sexually Abused: No    FAMILY HISTORY: Family History  Problem Relation Age of Onset   Lung cancer Father    Cancer Father    Hypertension Mother    Aneurysm Son     ALLERGIES:  is allergic to ephedrine, nsaids, other, vancomycin, amoxicillin, hyoscyamine sulfate, levsin [hyoscyamine sulfate], naproxen, penicillins, shellfish allergy, and sudafed [pseudoephedrine hcl].  MEDICATIONS:  Current Outpatient Medications  Medication Sig Dispense Refill   acetaminophen (TYLENOL) 500 MG tablet Take 1,000 mg by mouth daily as needed for moderate pain.     aspirin EC 81 MG tablet Take 81 mg by mouth every evening.      atenolol (TENORMIN) 50 MG  tablet TAKE 1 TABLET BY MOUTH  TWICE DAILY 180 tablet 3   doxycycline (VIBRA-TABS) 100 MG tablet Take 1 tablet (100 mg total) by mouth 2 (two) times daily. 14 tablet 0   folic acid (FOLVITE) 1 MG tablet Take 1 mg by mouth daily.     gabapentin (NEURONTIN) 300 MG capsule TAKE 1 TO 2 CAPSULES BY  MOUTH AT BEDTIME (Patient taking differently: TAKE 1 TO 2 CAPSULES BY  MOUTH AT BEDTIME) 180 capsule 3   glipiZIDE (GLUCOTROL XL) 2.5 MG 24 hr tablet Take 1 tablet (2.5 mg total) by mouth daily with breakfast. 30 tablet 2   glucose blood (ONETOUCH ULTRA) test strip Used to check blood glucose one time per day. 100 each 12   isosorbide mononitrate (IMDUR) 30 MG 24 hr tablet TAKE ONE-HALF TABLET BY MOUTH  DAILY 45 tablet 3   losartan (COZAAR) 25 MG tablet TAKE ONE-HALF TABLET BY  MOUTH DAILY (Patient taking differently: Take 12.5 mg by mouth every morning.) 45 tablet 3   melatonin 5 MG TABS Take 5 mg by mouth at bedtime as needed.     methotrexate (RHEUMATREX) 2.5 MG tablet Take 2.5 mg by mouth once a week. Pt states he takes 8 (2.47m) tablets weekly     nitroGLYCERIN (NITROSTAT) 0.4 MG SL tablet Place 1 tablet (0.4 mg total) under the tongue every 5 (five) minutes as needed. MAXIMUM OF 3 DOSES. 25 tablet 6   pantoprazole (PROTONIX) 40 MG tablet TAKE 1 TABLET BY MOUTH TWICE  DAILY 180 tablet 3   rosuvastatin (CRESTOR) 20 MG tablet Take 1 tablet (20 mg total) by mouth daily. 90 tablet 3   SYRINGE-NEEDLE, DISP, 3 ML 25G X 5/8" 3 ML MISC Use as instructed with B12 injection. 50 each 11   vitamin E 400 UNIT capsule Take 400 Units by mouth in the morning.     No current facility-administered medications for this visit.    Review of Systems  Constitutional:  Positive for fatigue. Negative for appetite change, chills, fever and unexpected weight change.  HENT:   Negative  for hearing loss and voice change.   Eyes:  Negative for eye problems and icterus.  Respiratory:  Negative for chest tightness, cough and  shortness of breath.   Cardiovascular:  Negative for chest pain and leg swelling.  Gastrointestinal:  Negative for abdominal distention and abdominal pain.  Endocrine: Negative for hot flashes.  Genitourinary:  Negative for difficulty urinating, dysuria and frequency.   Musculoskeletal:  Negative for arthralgias.  Skin:  Negative for itching and rash.  Neurological:  Negative for light-headedness and numbness.  Hematological:  Negative for adenopathy. Does not bruise/bleed easily.  Psychiatric/Behavioral:  Negative for confusion.     PHYSICAL EXAMINATION: ECOG PERFORMANCE STATUS: 1 - Symptomatic but completely ambulatory Vitals:   02/25/22 1544  BP: (!) 154/87  Pulse: 70  Resp: 16  Temp: 97.8 F (36.6 C)  SpO2: 99%   Filed Weights   02/25/22 1544  Weight: 177 lb (80.3 kg)    Physical Exam Constitutional:      General: He is not in acute distress. HENT:     Head: Normocephalic and atraumatic.  Eyes:     General: No scleral icterus. Cardiovascular:     Rate and Rhythm: Normal rate and regular rhythm.     Heart sounds: Normal heart sounds.  Pulmonary:     Effort: Pulmonary effort is normal. No respiratory distress.     Breath sounds: No wheezing.  Abdominal:     General: Bowel sounds are normal. There is no distension.     Palpations: Abdomen is soft.  Musculoskeletal:        General: No deformity. Normal range of motion.     Cervical back: Normal range of motion and neck supple.  Skin:    General: Skin is warm and dry.     Findings: No erythema or rash.  Neurological:     Mental Status: He is alert and oriented to person, place, and time. Mental status is at baseline.     Cranial Nerves: No cranial nerve deficit.     Coordination: Coordination normal.  Psychiatric:        Mood and Affect: Mood normal.      LABORATORY DATA:  I have reviewed the data as listed    Latest Ref Rng & Units 02/06/2022   10:18 AM 12/11/2021    3:25 PM 11/05/2021    9:21 AM  CBC   WBC 4.0 - 10.5 K/uL 6.1  5.7  4.1   Hemoglobin 13.0 - 17.0 g/dL 11.8  11.0  11.8   Hematocrit 39.0 - 52.0 % 35.5  33.0  36.2   Platelets 150.0 - 400.0 K/uL 227.0  180.0  160.0       Latest Ref Rng & Units 02/06/2022    3:27 PM 11/05/2021    9:21 AM 04/13/2021    3:31 PM  CMP  Glucose 70 - 99 mg/dL 193  163    BUN 6 - 23 mg/dL 14  8    Creatinine 0.40 - 1.50 mg/dL 0.78  0.76  0.73   Sodium 135 - 145 mEq/L 140  139    Potassium 3.5 - 5.1 mEq/L 4.1  3.8    Chloride 96 - 112 mEq/L 105  103    CO2 19 - 32 mEq/L 25  28    Calcium 8.4 - 10.5 mg/dL 9.2  9.3    Total Protein 6.0 - 8.3 g/dL 6.7  7.3    Total Bilirubin 0.2 - 1.2 mg/dL 0.4  0.5  Alkaline Phos 39 - 117 U/L 50  57    AST 0 - 37 U/L 13  16    ALT 0 - 53 U/L 12  13        Component Value Date/Time   IRON 144 02/06/2022 1018   TIBC 308.0 02/06/2022 1018   FERRITIN 17.3 (L) 02/06/2022 1018   IRONPCTSAT 46.8 02/06/2022 1018     RADIOGRAPHIC STUDIES: I have personally reviewed the radiological images as listed and agreed with the findings in the report. No results found.

## 2022-02-25 NOTE — Assessment & Plan Note (Signed)
His previous labs are consistent with iron deficiency anemia. I discussed about option of continue oral iron supplementation and repeat blood work for evaluation of treatment response.  If no significant improvement, then proceed with IV Venofer treatments. Alternative option of proceed with IV Venofer treatments. I discussed about the potential risks including but not limited to allergic reactions/infusion reactions including anaphylactic reactions, phlebitis, high blood pressure, wheezing, SOB, skin rash, weight gain,dark urine, leg swelling, back pain, headache, nausea and fatigue, etc. Patient tolerates oral iron supplement poorly and is interested in proceeding with IV Venofer treatments.. Plan IV venofer weekly x 3

## 2022-03-04 ENCOUNTER — Ambulatory Visit (INDEPENDENT_AMBULATORY_CARE_PROVIDER_SITE_OTHER): Payer: Medicare Other

## 2022-03-04 ENCOUNTER — Inpatient Hospital Stay: Payer: Medicare Other

## 2022-03-04 ENCOUNTER — Other Ambulatory Visit: Payer: Self-pay | Admitting: Cardiovascular Disease

## 2022-03-04 VITALS — BP 141/69 | HR 60 | Temp 99.0°F | Resp 17

## 2022-03-04 VITALS — Ht 68.0 in | Wt 174.0 lb

## 2022-03-04 DIAGNOSIS — Z809 Family history of malignant neoplasm, unspecified: Secondary | ICD-10-CM | POA: Diagnosis not present

## 2022-03-04 DIAGNOSIS — I2510 Atherosclerotic heart disease of native coronary artery without angina pectoris: Secondary | ICD-10-CM | POA: Diagnosis not present

## 2022-03-04 DIAGNOSIS — D509 Iron deficiency anemia, unspecified: Secondary | ICD-10-CM | POA: Diagnosis not present

## 2022-03-04 DIAGNOSIS — I10 Essential (primary) hypertension: Secondary | ICD-10-CM | POA: Diagnosis not present

## 2022-03-04 DIAGNOSIS — Z8719 Personal history of other diseases of the digestive system: Secondary | ICD-10-CM | POA: Diagnosis not present

## 2022-03-04 DIAGNOSIS — Z0000 Encounter for general adult medical examination without abnormal findings: Secondary | ICD-10-CM | POA: Diagnosis not present

## 2022-03-04 DIAGNOSIS — L4050 Arthropathic psoriasis, unspecified: Secondary | ICD-10-CM | POA: Diagnosis not present

## 2022-03-04 DIAGNOSIS — Z880 Allergy status to penicillin: Secondary | ICD-10-CM | POA: Diagnosis not present

## 2022-03-04 DIAGNOSIS — E119 Type 2 diabetes mellitus without complications: Secondary | ICD-10-CM | POA: Diagnosis not present

## 2022-03-04 DIAGNOSIS — Z9049 Acquired absence of other specified parts of digestive tract: Secondary | ICD-10-CM | POA: Diagnosis not present

## 2022-03-04 DIAGNOSIS — D508 Other iron deficiency anemias: Secondary | ICD-10-CM

## 2022-03-04 DIAGNOSIS — Z881 Allergy status to other antibiotic agents status: Secondary | ICD-10-CM | POA: Diagnosis not present

## 2022-03-04 DIAGNOSIS — Z886 Allergy status to analgesic agent status: Secondary | ICD-10-CM | POA: Diagnosis not present

## 2022-03-04 DIAGNOSIS — K222 Esophageal obstruction: Secondary | ICD-10-CM | POA: Diagnosis not present

## 2022-03-04 DIAGNOSIS — Z801 Family history of malignant neoplasm of trachea, bronchus and lung: Secondary | ICD-10-CM | POA: Diagnosis not present

## 2022-03-04 DIAGNOSIS — Z8616 Personal history of COVID-19: Secondary | ICD-10-CM | POA: Diagnosis not present

## 2022-03-04 DIAGNOSIS — Z87891 Personal history of nicotine dependence: Secondary | ICD-10-CM | POA: Diagnosis not present

## 2022-03-04 DIAGNOSIS — Z79899 Other long term (current) drug therapy: Secondary | ICD-10-CM | POA: Diagnosis not present

## 2022-03-04 DIAGNOSIS — Z8249 Family history of ischemic heart disease and other diseases of the circulatory system: Secondary | ICD-10-CM | POA: Diagnosis not present

## 2022-03-04 MED ORDER — SODIUM CHLORIDE 0.9 % IV SOLN
Freq: Once | INTRAVENOUS | Status: AC
  Filled 2022-03-04: qty 250

## 2022-03-04 MED ORDER — SODIUM CHLORIDE 0.9 % IV SOLN
200.0000 mg | Freq: Once | INTRAVENOUS | Status: AC
  Administered 2022-03-04: 200 mg via INTRAVENOUS
  Filled 2022-03-04: qty 200

## 2022-03-04 MED ORDER — SODIUM CHLORIDE 0.9% FLUSH
10.0000 mL | Freq: Once | INTRAVENOUS | Status: AC | PRN
  Administered 2022-03-04: 10 mL
  Filled 2022-03-04: qty 10

## 2022-03-04 NOTE — Progress Notes (Signed)
Subjective:   Shane Mcknight is a 72 y.o. male who presents for Medicare Annual/Subsequent preventive examination.  Review of Systems    No ROS.  Medicare Wellness Virtual Visit.  Visual/audio telehealth visit, UTA vital signs.   See social history for additional risk factors.   Cardiac Risk Factors include: advanced age (>3mn, >>33women);male gender     Objective:    Today's Vitals   03/04/22 1309  Weight: 174 lb (78.9 kg)  Height: 5' 8"$  (1.727 m)   Body mass index is 26.46 kg/m.     03/04/2022    1:14 PM 02/25/2022    3:27 PM 04/13/2021    3:22 PM 04/13/2021    6:27 AM 04/06/2021   12:06 PM 02/15/2021   12:40 PM 02/15/2020   12:37 PM  Advanced Directives  Does Patient Have a Medical Advance Directive? Yes Yes Yes Yes Yes Yes Yes  Type of [place]aramed[place]of [place] will Healthcare Power of AG[place]of [place] will [place] will  Does patient want to make changes to medical advance directive? No - Patient declined  No - Patient declined No - Patient declined  No - Patient declined No - Patient declined  Copy of HDorain Chart? No - copy requested   Yes - validated most recent copy scanned in chart (See row information)  No - copy requested No - copy requested    Current Medications (verified) Outpatient Encounter Medications as of 03/04/2022  Medication Sig   acetaminophen (TYLENOL) 500 MG tablet Take 1,000 mg by mouth daily as needed for moderate pain.   aspirin EC 81 MG tablet Take 81 mg by mouth every evening.    atenolol (TENORMIN) 50 MG tablet TAKE 1 TABLET BY MOUTH  TWICE DAILY   doxycycline (VIBRA-TABS) 100 MG tablet Take 1 tablet (100 mg total) by mouth 2 (two) times daily.   folic acid (FOLVITE) 1 MG tablet Take 1 mg by mouth daily.   gabapentin (NEURONTIN) 300 MG capsule TAKE 1 TO 2 CAPSULES BY  MOUTH AT  BEDTIME (Patient taking differently: TAKE 1 TO 2 CAPSULES BY  MOUTH AT BEDTIME)   glipiZIDE (GLUCOTROL XL) 2.5 MG 24 hr tablet Take 1 tablet (2.5 mg total) by mouth daily with breakfast.   glucose blood (ONETOUCH ULTRA) test strip Used to check blood glucose one time per day.   isosorbide mononitrate (IMDUR) 30 MG 24 hr tablet TAKE ONE-HALF TABLET BY MOUTH  DAILY   losartan (COZAAR) 25 MG tablet TAKE ONE-HALF TABLET BY  MOUTH DAILY (Patient taking differently: Take 12.5 mg by mouth every morning.)   melatonin 5 MG TABS Take 5 mg by mouth at bedtime as needed.   methotrexate (RHEUMATREX) 2.5 MG tablet Take 2.5 mg by mouth once a week. Pt states he takes 8 (2.577m tablets weekly   nitroGLYCERIN (NITROSTAT) 0.4 MG SL tablet Place 1 tablet (0.4 mg total) under the tongue every 5 (five) minutes as needed. MAXIMUM OF 3 DOSES.   pantoprazole (PROTONIX) 40 MG tablet TAKE 1 TABLET BY MOUTH TWICE  DAILY   rosuvastatin (CRESTOR) 20 MG tablet Take 1 tablet (20 mg total) by mouth daily.   SYRINGE-NEEDLE, DISP, 3 ML 25G X 5/8" 3 ML MISC Use as instructed with B12 injection.   vitamin E 400 UNIT capsule Take 400 Units by mouth in the morning.   No facility-administered encounter medications on  file as of 03/04/2022.    Allergies (verified) Ephedrine, Nsaids, Other, Vancomycin, Amoxicillin, Hyoscyamine sulfate, Levsin [hyoscyamine sulfate], Naproxen, Penicillins, Shellfish allergy, and Sudafed [pseudoephedrine hcl]   History: Past Medical History:  Diagnosis Date   Allergic state    Anemia    Aortic atherosclerosis (HCC)    Arthritis    spine, hands, shoulder with previous cuff tear   B12 deficiency    CAD (coronary artery disease)    a.) 11/2015 CT: Coronary Ca2+ noted. b.) LHC 06/14/2016: 60% pRCA, 60% RPLB-2, 99% OM1, 50% mLAD, 40% oLM-LM, 30% pLAD, 60% oD1-D1, 60% oLCx, 95% dRCA; PCI placing a 2.5 x 60m Resolute Onyx DES dRCA. c.) LHC 09/14/2018: 40% oLM-LM, 50% mLAD, 30% pLAD, 60% oD1-D1, 40% oLCx,  100% OM1, 60% p-mRCA, 50% RPAV; dRCA stent widely patent; medical mgmt.   Candidiasis of esophagus (HCC)    Carotid artery stenosis 04/17/2020   a.) Carotid doppler [id number]/LICA stenosis.   Colon polyp    COVID-19    09/2021   DDD (degenerative disc disease), lumbosacral    Diastolic dysfunction [id number]  a.)  TTE 04/06/2020; EF 55-60%; mild LA dilation; no RWMAs; G2DD.   Dysphagia    Effort angina    Elevated PSA    Elevated transaminase level    Esophageal stricture    a.) s/p multiple dilations   Esophagitis, [place] grade D    Fatty liver    Ganglion cyst of finger of right hand    GERD (gastroesophageal reflux disease)    Guaiac positive stools    Hiatal hernia    HNP (herniated nucleus pulposus), lumbar    Hypercholesterolemia    Hypertension    Long term current use of immunosuppressive drug    a.) MTX + secukinumab for severe psoriatic arthritis   Lumbar radiculitis    Mallory-Weiss tear 11/2015   Migraine aura without headache    OSA (obstructive sleep apnea)    a.) does not use nocturnal PAP therapy   Psoriasis    Psoriatic arthritis ([place]    a.) severe; Tx'd with MTX + secukinumab   PVC (premature ventricular contraction)    Schatzki's ring    Type II diabetes mellitus (HRulo    Past Surgical History:  Procedure Laterality Date   ANTERIOR CERVICAL DECOMP/DISCECTOMY FUSION N/A 04/13/2021   Procedure: C3-5 ANTERIOR CERVICAL DISCECTOMY AND FUSION (GLOBUS ALLOGRAFT);  Surgeon: YMeade Maw MD;  Location: [facility] ORS;  Service: Neurosurgery;  Laterality: N/A;   ARTHROPLASTY     right thumb; left thumb with 2 screws and plastic joint   BALLOON DILATION N/A 05/29/2015   Procedure: BALLOON DILATION;  Surgeon: MLollie Sails MD;  [facility];  Service: Endoscopy;  Laterality: N/A;   BALLOON DILATION N/A 02/14/2017   Procedure: BALLOON DILATION;  Surgeon: SLollie Sails MD;  [facility];  Service: Endoscopy;   Laterality: N/A;   BILATERAL CARPAL TUNNEL RELEASE Bilateral 07/04/2017   Procedure: BILATERAL CARPAL TUNNEL RELEASE;  Surgeon: KLeanor Kail MD;  Location: [facility] ORS;  Service: Orthopedics;  Laterality: Bilateral;   BLEPHAROPLASTY     BUNIONECTOMY Left 12/2020   CARDIAC CATHETERIZATION     1 stent    CARPAL TUNNEL RELEASE  2005   right   CHOLECYSTECTOMY  2008   COLONOSCOPY     COLONOSCOPY WITH PROPOFOL N/A 02/13/2018   Procedure: COLONOSCOPY WITH PROPOFOL;  Surgeon: SLollie Sails MD;  [facility];  Service: Endoscopy;  Laterality: N/A;   CORONARY  STENT INTERVENTION N/A 06/14/2016   Procedure: Coronary Stent Intervention;  Surgeon: Wellington Hampshire, MD;  Location: [place] [facility] LAB;  Service: Cardiovascular;  Laterality: N/A;   CYST EXCISION  04/14/2015   tendon sheath cyst excision; right ring finger cyst removed and trigger finger realease   ESOPHAGOGASTRODUODENOSCOPY     ESOPHAGOGASTRODUODENOSCOPY (EGD) WITH PROPOFOL N/A 05/29/2015   Procedure: ESOPHAGOGASTRODUODENOSCOPY (EGD) WITH PROPOFOL;  Surgeon: Lollie Sails, MD;  Location[facility] END[facility]COPY;  Service: Endoscopy;  Laterality: N/A;   ESOPHAGOGASTRODUODENOSCOPY (EGD) WITH PROPOFOL N/A 07/04/2015   Procedure: ESOPHAGOGASTRODUODENOSCOPY (EGD) WITH PROPOFOL;  Surgeon: Lollie Sails, MD;  Location: [facility] ENDOSCOPY;  Service: Endoscopy;  Laterality: N/A;   ESOPHAGOGASTRODUODENOSCOPY (EGD) WITH PROPOFOL N/A 01/04/2016   Procedure: ESOPHAGOGASTRODUODENOSCOPY (EGD) WITH PROPOFOL;  Surgeon: Lollie Sails, MD;  Location: [facility] ENDOSCOPY;  Service: Endoscopy;  Laterality: N/A;   ESOPHAGOGASTRODUODENOSCOPY (EGD) WITH PROPOFOL N/A 03/22/2016   Procedure: ESOPHAGOGASTRODUODENOSCOPY (EGD) WITH PROPOFOL;  Surgeon: Lollie Sails, MD;  Location: [facility] ENDOSCOPY;  Service: Endoscopy;  Laterality: N/A;   ESOPHAGOGASTRODUODENOSCOPY (EGD) WITH PROPOFOL N/A 02/14/2017   Procedure: ESOPHAGOGASTRODUODENOSCOPY (EGD) WITH  PROPOFOL;  Surgeon: Lollie Sails, MD;  Location: [facility] ENDOSCOPY;  Service: Endoscopy;  Laterality: N/A;   ESOPHAGOGASTRODUODENOSCOPY (EGD) WITH PROPOFOL N/A 04/11/2017   Procedure: ESOPHAGOGASTRODUODENOSCOPY (EGD) WITH PROPOFOL;  Surgeon: Lollie Sails, MD;  Location: [facility] ENDOSCOPY;  Service: Endoscopy;  Laterality: N/A;   ESOPHAGOGASTRODUODENOSCOPY (EGD) WITH PROPOFOL N/A 07/07/2017   Procedure: ESOPHAGOGASTRODUODENOSCOPY (EGD) WITH PROPOFOL;  Surgeon: Lucilla Lame, MD;  Location[facility] END[facility];  Service: Endoscopy;  Laterality: N/A;   ESOPHAGOGASTRODUODENOSCOPY (EGD) WITH PROPOFOL N/A 02/13/2018   Procedure: ESOPHAGOGASTRODUODENOSCOPY (EGD) WITH PROPOFOL;  Surgeon: Lollie Sails, MD;  Location[facility] ENDOSCOPY;  Service: Endoscopy;  Laterality: N/A;   FOREIGN BODY REMOVAL N/A 12/03/2015   Procedure: FOREIGN BODY REMOVAL;  Surgeon: Wilford Corner, MD;  Location: [facility] ENDOSCOPY;  Service: Endoscopy;  Laterality: N/A;   HERNIA REPAIR Right 1970   inguinal   INGUINAL HERNIA REPAIR Left 01/01/2018   Procedure: OPEN HERNIA REPAIR INGUINAL ADULT;  Surgeon: Olean Ree, MD;  Location: [facility] ORS;  Service: General;  Laterality: Left;   LEFT HEART CATH Left 06/14/2016   Procedure: Left Heart Cath;  Surgeon: Wellington Hampshire, MD;  Location: [place] [facility] LAB;  Service: Cardiovascular;  Laterality: Left;   LEFT HEART CATH AND CORONARY ANGIOGRAPHY Left 09/14/2018   Procedure: LEFT HEART CATH AND CORONARY ANGIOGRAPHY;  Surgeon: Wellington Hampshire, MD;  Location: [place] [facility] LAB;  Service: Cardiovascular;  Laterality: Left;   LUMBAR [place] SURGERY  2004   injections; herniated disc; percutaneous discectomy   NASAL SINUS SURGERY  2008   ROTATOR CUFF REPAIR Right    TONSILLECTOMY     Family History  Problem Relation Age of Onset   Lung cancer Father    Cancer Father    Hypertension Mother    Aneurysm Son    Social History   Socioeconomic History   Marital status: Married     Spouse name: Pamala Hurry   Number of children: Not on file   Years of education: Not on file   Highest education level: Not on file  Occupational History    Comment: not heavy work  Tobacco Use   Smoking status: Former    Packs/day: 0.25    Years: 7.00    Total pack years: 1.75    Types: Cigarettes    Quit date: 07/06/1982    Years since quitting: 47.6  Passive exposure: Past   Smokeless tobacco: Never  Vaping Use   Vaping Use: Never used  Substance and Sexual Activity   Alcohol use: Not Currently   Drug use: No   Sexual activity: Not on file  Other Topics Concern   Not on file  Social History Narrative   Lives locally with wife.  Active but does not routinely exercise.   Social Determinants of Health   Financial Resource Strain: Low Risk  (03/04/2022)   Overall Financial Resource Strain (CARDIA)    Difficulty of Paying Living Expenses: Not hard at all  Food Insecurity: No Food Insecurity (03/04/2022)   Hunger Vital Sign    Worried About Running Out of Food in the Last Year: Never true    Ran Out of Food in the Last Year: Never true  Transportation Needs: No Transportation Needs (03/04/2022)   PRAPARE - Hydrologist (Medical): No    Lack of Transportation (Non-Medical): No  Physical Activity: Unknown (03/04/2022)   Exercise Vital Sign    Days of Exercise per Week: 0 days    Minutes of Exercise per Session: Not on file  Stress: No Stress Concern Present (03/04/2022)   [place]    Feeling of Stress : Not at all  Social Connections: Unknown (03/04/2022)   Social Connection and Isolation Panel [NHANES]    Frequency of Communication with Friends and Family: Not on file    Frequency of Social Gatherings with Friends and Family: Not on file    Attends Religious Services: Not on file    Active Member of Clubs or Organizations: Not on file    Attends Archivist Meetings:  Not on file    Marital Status: Married    Tobacco Counseling Counseling given: Not Answered   Clinical Intake:  Pre-visit preparation completed: Yes        Diabetes: Yes (Followed by pcp)  How often do you need to have someone help you when you read instructions, pamphlets, or other written materials from your doctor or pharmacy?: 1 - Never    Interpreter Needed?: No      Activities of Daily Living    03/04/2022    1:15 PM 04/13/2021    3:51 PM  In your present state of health, do you have any difficulty performing the following activities:  Hearing? 0   Vision? 0   Difficulty concentrating or making decisions? 0   Walking or climbing stairs? 0   Dressing or bathing? 0   Doing errands, shopping? 0 0  Preparing Food and eating ? N   Using the Toilet? N   In the past six months, have you accidently leaked urine? N   Do you have problems with loss of bowel control? N   Managing your Medications? N   Managing your Finances? N   Housekeeping or managing your Housekeeping? N     Patient Care Team: Einar Pheasant, MD as PCP - General (Internal Medicine) Wellington Hampshire, MD as PCP - Cardiology (Cardiology)  Indicate any recent Medical Services you may have received from other than [facility] providers in the past year (date may be approximate).     Assessment:   This is a routine wellness examination for Shane Mcknight.  I connected with  Katlin O Manny on 03/04/22 by a audio enabled telemedicine application and verified that I am speaking with the correct person using two identifiers.  Patient Location: Home  Provider Location: Office/Clinic  I discussed the limitations of evaluation and management by telemedicine. The patient expressed understanding and agreed to proceed.   Hearing/Vision screen Hearing Screening - Comments:: Patient is able to hear conversational tones without difficulty. No issues reported. Vision Screening - Comments:: Followed by [facility] Wears corrective lenses They have seen their ophthalmologist in the last 12 months    Dietary issues and exercise activities discussed: Current Exercise Habits: Home exercise routine, Intensity: Mild Healthy diet    Goals Addressed             This Visit's Progress    Follow up with Primary Care Provider       As needed       Depression Screen    03/04/2022    1:12 PM 02/25/2022    3:42 PM 11/06/2021    3:22 PM 02/15/2021   12:38 PM 05/12/2020    4:14 PM 02/15/2020   12:39 PM 12/24/2019   10:43 AM  PHQ 2/9 Scores  PHQ - 2 Score 0 0 0 0 0 0 0    Fall Risk    03/04/2022    1:15 PM 02/14/2022    3:43 PM 11/06/2021    3:22 PM 02/15/2021   12:41 PM 05/12/2020    4:14 PM  [place] in the past year? 0 0 0 0 0  Number falls in past yr: 0 0 0 0 0  Injury with Fall? 0 0 0  0  Risk for fall due to :  No Fall Risks No Fall Risks    Follow up Falls evaluation completed;Falls prevention discussed Falls evaluation completed Falls evaluation completed Falls evaluation completed Falls evaluation completed    FALL RISK PREVENTION PERTAINING TO THE HOME: Home free of loose throw rugs in walkways, pet beds, electrical cords, etc? Yes  Adequate lighting in your home to reduce risk of falls? Yes   ASSISTIVE DEVICES UTILIZED TO PREVENT FALLS: Life alert? No  Use of a cane, walker or w/c? No  Grab bars in the bathroom? Yes   TIMED UP AND GO: Was the test performed? No .   Cognitive Function:        03/04/2022    1:16 PM 02/12/2019   11:50 AM  6CIT Screen  What Year? 0 points 0 points  What month? 0 points 0 points  What time? 0 points 0 points  Count back from 20 0 points 0 points  Months in reverse 0 points 0 points  Repeat phrase 0 points 0 points  Total Score 0 points 0 points    Immunizations Immunization History  Administered Date(s) Administered   Janssen (J&J) SARS-COV-2 Vaccination 05/25/2019   Moderna Sars-Covid-2 Vaccination 11/08/2019    PNEUMOCOCCAL CONJUGATE-20 05/12/2020   Pneumococcal Polysaccharide-23 11/22/2016   Td 11/22/2016   Covid-19 vaccine status: Completed vaccines x2.   Screening Tests Health Maintenance  Topic Date Due   Diabetic kidney evaluation - Urine ACR  12/16/2020   OPHTHALMOLOGY EXAM  02/01/2022   COVID-19 Vaccine (3 - 2023-24 season) 03/20/2022 (Originally 09/14/2021)   INFLUENZA VACCINE  04/14/2022 (Originally 08/14/2021)   Zoster Vaccines- Shingrix (1 of 2) 05/15/2022 (Originally 10/02/1969)   HEMOGLOBIN A1C  08/07/2022   Diabetic kidney evaluation - eGFR measurement  02/07/2023   FOOT EXAM  02/15/2023   Medicare Annual Wellness (AWV)  03/05/2023   DTaP/Tdap/Td (2 - Tdap) 11/23/2026   COLONOSCOPY (Pts 45-45yr Insurance coverage will need to be confirmed)  02/14/2028  Pneumonia Vaccine 63+ Years old  Completed   Hepatitis C Screening  Completed   HPV VACCINES  Aged Out   Health Maintenance Health Maintenance Due  Topic Date Due   Diabetic kidney evaluation - Urine ACR  12/16/2020   OPHTHALMOLOGY EXAM  02/01/2022   DG Chest 2 View: completed 10/10/21.   Hepatitis C Screening: Completed 2018.  Vision Screening: Recommended annual ophthalmology exams for early detection of glaucoma and other disorders of the eye.  Dental Screening: Recommended annual dental exams for proper oral hygiene  Community Resource Referral / Chronic Care Management: CRR required this visit?  No   CCM required this visit?  No      Plan:     I have personally reviewed and noted the following in the patient's chart:   Medical and social history Use of alcohol, tobacco or illicit drugs  Current medications and supplements including opioid prescriptions. Patient is not currently taking opioid prescriptions. Functional ability and status Nutritional status Physical activity Advanced directives List of other physicians Hospitalizations, surgeries, and ER visits in previous 12 months Vitals Screenings to  include cognitive, depression, and falls Referrals and appointments  In addition, I have reviewed and discussed with patient certain preventive protocols, quality metrics, and best practice recommendations. A written personalized care plan for preventive services as well as general preventive health recommendations were provided to patient.     Leta Jungling, LPN   [id number]

## 2022-03-04 NOTE — Progress Notes (Signed)
Patient tolerated Venofer infusion well, no questions/concerns voiced. Monitored 30 min post transfusion. Patient stable at discharge. VSS. AVS given.

## 2022-03-04 NOTE — Patient Instructions (Signed)

## 2022-03-04 NOTE — Patient Instructions (Addendum)
Shane Mcknight , Thank you for taking time to come for your Medicare Wellness Visit. I appreciate your ongoing commitment to your health goals. Please review the following plan we discussed and let me know if I can assist you in the future.   These are the goals we discussed:  Goals      Follow up with Primary Care Provider     As needed        This is a list of the screening recommended for you and due dates:  Health Maintenance  Topic Date Due   Yearly kidney health urinalysis for diabetes  12/16/2020   Eye exam for diabetics  02/01/2022   COVID-19 Vaccine (3 - 2023-24 season) 03/20/2022*   Flu Shot  04/14/2022*   Zoster (Shingles) Vaccine (1 of 2) 05/15/2022*   Hemoglobin A1C  08/07/2022   Yearly kidney function blood test for diabetes  02/07/2023   Complete foot exam   02/15/2023   Medicare Annual Wellness Visit  03/05/2023   DTaP/Tdap/Td vaccine (2 - Tdap) 11/23/2026   Colon Cancer Screening  02/14/2028   Pneumonia Vaccine  Completed   Hepatitis C Screening: USPSTF Recommendation to screen - Ages 45-79 yo.  Completed   HPV Vaccine  Aged Out  *Topic was postponed. The date shown is not the original due date.   Conditions/risks identified: none new  Next appointment: Follow up in one year for your annual wellness visit.   Preventive Care 52 Years and Older, Male  Preventive care refers to lifestyle choices and visits with your health care provider that can promote health and wellness. What does preventive care include? A yearly physical exam. This is also called an annual well check. Dental exams once or twice a year. Routine eye exams. Ask your health care provider how often you should have your eyes checked. Personal lifestyle choices, including: Daily care of your teeth and gums. Regular physical activity. Eating a healthy diet. Avoiding tobacco and drug use. Limiting alcohol use. Practicing safe sex. Taking low doses of aspirin every day. Taking vitamin and mineral  supplements as recommended by your health care provider. What happens during an annual well check? The services and screenings done by your health care provider during your annual well check will depend on your age, overall health, lifestyle risk factors, and family history of disease. Counseling  Your health care provider may ask you questions about your: Alcohol use. Tobacco use. Drug use. Emotional well-being. Home and relationship well-being. Sexual activity. Eating habits. History of falls. Memory and ability to understand (cognition). Work and work Statistician. Screening  You may have the following tests or measurements: Height, weight, and BMI. Blood pressure. Lipid and cholesterol levels. These may be checked every 5 years, or more frequently if you are over 19 years old. Skin check. Lung cancer screening. You may have this screening every year starting at age 5 if you have a 30-pack-year history of smoking and currently smoke or have quit within the past 15 years. Fecal occult blood test (FOBT) of the stool. You may have this test every year starting at age 36. Flexible sigmoidoscopy or colonoscopy. You may have a sigmoidoscopy every 5 years or a colonoscopy every 10 years starting at age 26. Prostate cancer screening. Recommendations will vary depending on your family history and other risks. Hepatitis C blood test. Hepatitis B blood test. Sexually transmitted disease (STD) testing. Diabetes screening. This is done by checking your blood sugar (glucose) after you have not eaten for  a while (fasting). You may have this done every 1-3 years. Abdominal aortic aneurysm (AAA) screening. You may need this if you are a current or former smoker. Osteoporosis. You may be screened starting at age 74 if you are at high risk. Talk with your health care provider about your test results, treatment options, and if necessary, the need for more tests. Vaccines  Your health care provider  may recommend certain vaccines, such as: Influenza vaccine. This is recommended every year. Tetanus, diphtheria, and acellular pertussis (Tdap, Td) vaccine. You may need a Td booster every 10 years. Zoster vaccine. You may need this after age 74. Pneumococcal 13-valent conjugate (PCV13) vaccine. One dose is recommended after age 74. Pneumococcal polysaccharide (PPSV23) vaccine. One dose is recommended after age 74. Talk to your health care provider about which screenings and vaccines you need and how often you need them. This information is not intended to replace advice given to you by your health care provider. Make sure you discuss any questions you have with your health care provider. Document Released: 01/27/2015 Document Revised: 09/20/2015 Document Reviewed: 11/01/2014 Elsevier Interactive Patient Education  2017 [place] Prevention in the Home Falls can cause injuries. They can happen to people of all ages. There are many things you can do to make your home safe and to help prevent falls. What can I do on the outside of my home? Regularly fix the edges of walkways and driveways and fix any cracks. Remove anything that might make you trip as you walk through a door, such as a raised step or threshold. Trim any bushes or trees on the path to your home. Use bright outdoor lighting. Clear any walking paths of anything that might make someone trip, such as rocks or tools. Regularly check to see if handrails are loose or broken. Make sure that both sides of any steps have handrails. Any raised decks and porches should have guardrails on the edges. Have any leaves, snow, or ice cleared regularly. Use sand or salt on walking paths during winter. Clean up any spills in your garage right away. This includes oil or grease spills. What can I do in the bathroom? Use night lights. Install grab bars by the toilet and in the tub and shower. Do not use towel bars as grab bars. Use  non-skid mats or decals in the tub or shower. If you need to sit down in the shower, use a plastic, non-slip stool. Keep the floor dry. Clean up any water that spills on the floor as soon as it happens. Remove soap buildup in the tub or shower regularly. Attach bath mats securely with double-sided non-slip rug tape. Do not have throw rugs and other things on the floor that can make you trip. What can I do in the bedroom? Use night lights. Make sure that you have a light by your bed that is easy to reach. Do not use any sheets or blankets that are too big for your bed. They should not hang down onto the floor. Have a firm chair that has side arms. You can use this for support while you get dressed. Do not have throw rugs and other things on the floor that can make you trip. What can I do in the kitchen? Clean up any spills right away. Avoid walking on wet floors. Keep items that you use a lot in easy-to-reach places. If you need to reach something above you, use a strong step stool that has a  grab bar. Keep electrical cords out of the way. Do not use floor polish or wax that makes floors slippery. If you must use wax, use non-skid floor wax. Do not have throw rugs and other things on the floor that can make you trip. What can I do with my stairs? Do not leave any items on the stairs. Make sure that there are handrails on both sides of the stairs and use them. Fix handrails that are broken or loose. Make sure that handrails are as long as the stairways. Check any carpeting to make sure that it is firmly attached to the stairs. Fix any carpet that is loose or worn. Avoid having throw rugs at the top or bottom of the stairs. If you do have throw rugs, attach them to the floor with carpet tape. Make sure that you have a light switch at the top of the stairs and the bottom of the stairs. If you do not have them, ask someone to add them for you. What else can I do to help prevent falls? Wear  shoes that: Do not have high heels. Have rubber bottoms. Are comfortable and fit you well. Are closed at the toe. Do not wear sandals. If you use a stepladder: Make sure that it is fully opened. Do not climb a closed stepladder. Make sure that both sides of the stepladder are locked into place. Ask someone to hold it for you, if possible. Clearly mark and make sure that you can see: Any grab bars or handrails. First and last steps. Where the edge of each step is. Use tools that help you move around (mobility aids) if they are needed. These include: Canes. Walkers. Scooters. Crutches. Turn on the lights when you go into a dark area. Replace any light bulbs as soon as they burn out. Set up your furniture so you have a clear path. Avoid moving your furniture around. If any of your floors are uneven, fix them. If there are any pets around you, be aware of where they are. Review your medicines with your doctor. Some medicines can make you feel dizzy. This can increase your chance of falling. Ask your doctor what other things that you can do to help prevent falls. This information is not intended to replace advice given to you by your health care provider. Make sure you discuss any questions you have with your health care provider. Document Released: 10/27/2008 Document Revised: 06/08/2015 Document Reviewed: 02/04/2014 Elsevier Interactive Patient Education  2017 [employer].

## 2022-03-11 ENCOUNTER — Inpatient Hospital Stay: Payer: Medicare Other

## 2022-03-11 VITALS — BP 132/79 | HR 65 | Temp 98.1°F | Resp 16

## 2022-03-11 DIAGNOSIS — Z79899 Other long term (current) drug therapy: Secondary | ICD-10-CM | POA: Diagnosis not present

## 2022-03-11 DIAGNOSIS — D508 Other iron deficiency anemias: Secondary | ICD-10-CM

## 2022-03-11 DIAGNOSIS — Z886 Allergy status to analgesic agent status: Secondary | ICD-10-CM | POA: Diagnosis not present

## 2022-03-11 DIAGNOSIS — I2510 Atherosclerotic heart disease of native coronary artery without angina pectoris: Secondary | ICD-10-CM | POA: Diagnosis not present

## 2022-03-11 DIAGNOSIS — Z881 Allergy status to other antibiotic agents status: Secondary | ICD-10-CM | POA: Diagnosis not present

## 2022-03-11 DIAGNOSIS — Z87891 Personal history of nicotine dependence: Secondary | ICD-10-CM | POA: Diagnosis not present

## 2022-03-11 DIAGNOSIS — L4050 Arthropathic psoriasis, unspecified: Secondary | ICD-10-CM | POA: Diagnosis not present

## 2022-03-11 DIAGNOSIS — D509 Iron deficiency anemia, unspecified: Secondary | ICD-10-CM | POA: Diagnosis not present

## 2022-03-11 DIAGNOSIS — I10 Essential (primary) hypertension: Secondary | ICD-10-CM | POA: Diagnosis not present

## 2022-03-11 DIAGNOSIS — Z8249 Family history of ischemic heart disease and other diseases of the circulatory system: Secondary | ICD-10-CM | POA: Diagnosis not present

## 2022-03-11 DIAGNOSIS — Z8616 Personal history of COVID-19: Secondary | ICD-10-CM | POA: Diagnosis not present

## 2022-03-11 DIAGNOSIS — Z8719 Personal history of other diseases of the digestive system: Secondary | ICD-10-CM | POA: Diagnosis not present

## 2022-03-11 DIAGNOSIS — E119 Type 2 diabetes mellitus without complications: Secondary | ICD-10-CM | POA: Diagnosis not present

## 2022-03-11 DIAGNOSIS — K222 Esophageal obstruction: Secondary | ICD-10-CM | POA: Diagnosis not present

## 2022-03-11 DIAGNOSIS — Z809 Family history of malignant neoplasm, unspecified: Secondary | ICD-10-CM | POA: Diagnosis not present

## 2022-03-11 DIAGNOSIS — Z9049 Acquired absence of other specified parts of digestive tract: Secondary | ICD-10-CM | POA: Diagnosis not present

## 2022-03-11 DIAGNOSIS — Z801 Family history of malignant neoplasm of trachea, bronchus and lung: Secondary | ICD-10-CM | POA: Diagnosis not present

## 2022-03-11 DIAGNOSIS — Z880 Allergy status to penicillin: Secondary | ICD-10-CM | POA: Diagnosis not present

## 2022-03-11 MED ORDER — SODIUM CHLORIDE 0.9 % IV SOLN
Freq: Once | INTRAVENOUS | Status: AC
  Filled 2022-03-11: qty 250

## 2022-03-11 MED ORDER — SODIUM CHLORIDE 0.9 % IV SOLN
200.0000 mg | Freq: Once | INTRAVENOUS | Status: AC
  Administered 2022-03-11: 200 mg via INTRAVENOUS
  Filled 2022-03-11: qty 200

## 2022-03-18 ENCOUNTER — Inpatient Hospital Stay: Payer: Medicare Other | Attending: Oncology

## 2022-03-18 VITALS — BP 119/74 | HR 63 | Temp 98.6°F | Resp 16

## 2022-03-18 DIAGNOSIS — Z79899 Other long term (current) drug therapy: Secondary | ICD-10-CM | POA: Diagnosis not present

## 2022-03-18 DIAGNOSIS — D509 Iron deficiency anemia, unspecified: Secondary | ICD-10-CM | POA: Diagnosis not present

## 2022-03-18 DIAGNOSIS — D508 Other iron deficiency anemias: Secondary | ICD-10-CM

## 2022-03-18 MED ORDER — SODIUM CHLORIDE 0.9 % IV SOLN
200.0000 mg | Freq: Once | INTRAVENOUS | Status: AC
  Administered 2022-03-18: 200 mg via INTRAVENOUS
  Filled 2022-03-18: qty 200

## 2022-03-18 MED ORDER — SODIUM CHLORIDE 0.9 % IV SOLN
Freq: Once | INTRAVENOUS | Status: AC
  Filled 2022-03-18: qty 250

## 2022-03-18 NOTE — Patient Instructions (Signed)

## 2022-04-01 ENCOUNTER — Other Ambulatory Visit: Payer: Self-pay | Admitting: Internal Medicine

## 2022-04-01 DIAGNOSIS — I5022 Chronic systolic (congestive) heart failure: Secondary | ICD-10-CM

## 2022-04-24 ENCOUNTER — Telehealth: Payer: Self-pay

## 2022-04-24 NOTE — Progress Notes (Signed)
This patient is appearing on the insurance-provided list for being at risk of failing the adherence measure for cholesterol and hypertension (ACEi/ARB) medications this calendar year.   Medication: losartan (last filled 12/31/2021 for a 90 day supply) and rosuvastatin (last filled 01/09/2022 for a 90 day supply)  Attempted to outreach patient to discuss adherence and any barriers to adherence. Unable to reach patient. LVM requesting a return call.   Valeda Malm, Pharm.D. PGY-2 Ambulatory Care Pharmacy Resident

## 2022-05-24 ENCOUNTER — Inpatient Hospital Stay: Payer: Medicare Other | Attending: Oncology

## 2022-05-24 DIAGNOSIS — Z8719 Personal history of other diseases of the digestive system: Secondary | ICD-10-CM | POA: Insufficient documentation

## 2022-05-24 DIAGNOSIS — Z9049 Acquired absence of other specified parts of digestive tract: Secondary | ICD-10-CM | POA: Insufficient documentation

## 2022-05-24 DIAGNOSIS — Z79899 Other long term (current) drug therapy: Secondary | ICD-10-CM | POA: Insufficient documentation

## 2022-05-24 DIAGNOSIS — Z886 Allergy status to analgesic agent status: Secondary | ICD-10-CM | POA: Insufficient documentation

## 2022-05-24 DIAGNOSIS — Z801 Family history of malignant neoplasm of trachea, bronchus and lung: Secondary | ICD-10-CM | POA: Insufficient documentation

## 2022-05-24 DIAGNOSIS — Z880 Allergy status to penicillin: Secondary | ICD-10-CM | POA: Insufficient documentation

## 2022-05-24 DIAGNOSIS — Z8616 Personal history of COVID-19: Secondary | ICD-10-CM | POA: Insufficient documentation

## 2022-05-24 DIAGNOSIS — Z8249 Family history of ischemic heart disease and other diseases of the circulatory system: Secondary | ICD-10-CM | POA: Insufficient documentation

## 2022-05-24 DIAGNOSIS — Z809 Family history of malignant neoplasm, unspecified: Secondary | ICD-10-CM | POA: Insufficient documentation

## 2022-05-24 DIAGNOSIS — Z87891 Personal history of nicotine dependence: Secondary | ICD-10-CM | POA: Insufficient documentation

## 2022-05-24 DIAGNOSIS — Z881 Allergy status to other antibiotic agents status: Secondary | ICD-10-CM | POA: Insufficient documentation

## 2022-05-24 DIAGNOSIS — Z7982 Long term (current) use of aspirin: Secondary | ICD-10-CM | POA: Insufficient documentation

## 2022-05-24 DIAGNOSIS — I2510 Atherosclerotic heart disease of native coronary artery without angina pectoris: Secondary | ICD-10-CM | POA: Insufficient documentation

## 2022-05-24 DIAGNOSIS — D509 Iron deficiency anemia, unspecified: Secondary | ICD-10-CM | POA: Insufficient documentation

## 2022-05-24 DIAGNOSIS — K222 Esophageal obstruction: Secondary | ICD-10-CM | POA: Insufficient documentation

## 2022-05-24 DIAGNOSIS — E119 Type 2 diabetes mellitus without complications: Secondary | ICD-10-CM | POA: Insufficient documentation

## 2022-05-24 DIAGNOSIS — I10 Essential (primary) hypertension: Secondary | ICD-10-CM | POA: Insufficient documentation

## 2022-05-24 DIAGNOSIS — L4050 Arthropathic psoriasis, unspecified: Secondary | ICD-10-CM | POA: Insufficient documentation

## 2022-05-27 ENCOUNTER — Encounter: Payer: Self-pay | Admitting: Internal Medicine

## 2022-05-28 ENCOUNTER — Other Ambulatory Visit: Payer: Self-pay

## 2022-05-28 MED ORDER — GLIPIZIDE ER 2.5 MG PO TB24: 2.5000 mg | ORAL_TABLET | Freq: Every day | ORAL | 0 refills | Status: DC

## 2022-05-28 MED ORDER — GLIPIZIDE ER 2.5 MG PO TB24: 2.5000 mg | ORAL_TABLET | Freq: Every day | ORAL | 1 refills | Status: DC

## 2022-05-28 MED FILL — Iron Sucrose Inj 20 MG/ML (Fe Equiv): INTRAVENOUS | Qty: 10 | Status: AC

## 2022-05-29 ENCOUNTER — Encounter: Payer: Self-pay | Admitting: Oncology

## 2022-05-29 ENCOUNTER — Inpatient Hospital Stay (HOSPITAL_BASED_OUTPATIENT_CLINIC_OR_DEPARTMENT_OTHER): Payer: Medicare Other | Admitting: Oncology

## 2022-05-29 ENCOUNTER — Inpatient Hospital Stay: Payer: Medicare Other

## 2022-05-29 VITALS — BP 159/80 | HR 69 | Temp 98.0°F | Wt 181.9 lb

## 2022-05-29 DIAGNOSIS — I10 Essential (primary) hypertension: Secondary | ICD-10-CM | POA: Diagnosis not present

## 2022-05-29 DIAGNOSIS — Z886 Allergy status to analgesic agent status: Secondary | ICD-10-CM | POA: Diagnosis not present

## 2022-05-29 DIAGNOSIS — Z881 Allergy status to other antibiotic agents status: Secondary | ICD-10-CM | POA: Diagnosis not present

## 2022-05-29 DIAGNOSIS — E119 Type 2 diabetes mellitus without complications: Secondary | ICD-10-CM | POA: Diagnosis not present

## 2022-05-29 DIAGNOSIS — D509 Iron deficiency anemia, unspecified: Secondary | ICD-10-CM | POA: Diagnosis not present

## 2022-05-29 DIAGNOSIS — D508 Other iron deficiency anemias: Secondary | ICD-10-CM | POA: Diagnosis not present

## 2022-05-29 DIAGNOSIS — Z8249 Family history of ischemic heart disease and other diseases of the circulatory system: Secondary | ICD-10-CM | POA: Diagnosis not present

## 2022-05-29 DIAGNOSIS — I2510 Atherosclerotic heart disease of native coronary artery without angina pectoris: Secondary | ICD-10-CM | POA: Diagnosis not present

## 2022-05-29 DIAGNOSIS — E538 Deficiency of other specified B group vitamins: Secondary | ICD-10-CM

## 2022-05-29 DIAGNOSIS — Z8719 Personal history of other diseases of the digestive system: Secondary | ICD-10-CM | POA: Diagnosis not present

## 2022-05-29 DIAGNOSIS — Z8616 Personal history of COVID-19: Secondary | ICD-10-CM | POA: Diagnosis not present

## 2022-05-29 DIAGNOSIS — Z809 Family history of malignant neoplasm, unspecified: Secondary | ICD-10-CM | POA: Diagnosis not present

## 2022-05-29 DIAGNOSIS — Z9049 Acquired absence of other specified parts of digestive tract: Secondary | ICD-10-CM | POA: Diagnosis not present

## 2022-05-29 DIAGNOSIS — Z801 Family history of malignant neoplasm of trachea, bronchus and lung: Secondary | ICD-10-CM | POA: Diagnosis not present

## 2022-05-29 DIAGNOSIS — Z79899 Other long term (current) drug therapy: Secondary | ICD-10-CM | POA: Diagnosis not present

## 2022-05-29 DIAGNOSIS — Z7982 Long term (current) use of aspirin: Secondary | ICD-10-CM | POA: Diagnosis not present

## 2022-05-29 DIAGNOSIS — L4050 Arthropathic psoriasis, unspecified: Secondary | ICD-10-CM | POA: Diagnosis not present

## 2022-05-29 DIAGNOSIS — Z87891 Personal history of nicotine dependence: Secondary | ICD-10-CM | POA: Diagnosis not present

## 2022-05-29 DIAGNOSIS — Z880 Allergy status to penicillin: Secondary | ICD-10-CM | POA: Diagnosis not present

## 2022-05-29 DIAGNOSIS — K222 Esophageal obstruction: Secondary | ICD-10-CM | POA: Diagnosis not present

## 2022-05-29 LAB — CBC WITH DIFFERENTIAL/PLATELET
Abs Immature Granulocytes: 0.02 10*3/uL (ref 0.00–0.07)
Basophils Absolute: 0 10*3/uL (ref 0.0–0.1)
Basophils Relative: 1 %
Eosinophils Absolute: 0.4 10*3/uL (ref 0.0–0.5)
Eosinophils Relative: 6 %
HCT: 37.7 % — ABNORMAL LOW (ref 39.0–52.0)
Hemoglobin: 13.2 g/dL (ref 13.0–17.0)
Immature Granulocytes: 0 %
Lymphocytes Relative: 16 %
Lymphs Abs: 1 10*3/uL (ref 0.7–4.0)
MCH: 31.3 pg (ref 26.0–34.0)
MCHC: 35 g/dL (ref 30.0–36.0)
MCV: 89.3 fL (ref 80.0–100.0)
Monocytes Absolute: 0.6 10*3/uL (ref 0.1–1.0)
Monocytes Relative: 10 %
Neutro Abs: 4.2 10*3/uL (ref 1.7–7.7)
Neutrophils Relative %: 67 %
Platelets: 155 10*3/uL (ref 150–400)
RBC: 4.22 MIL/uL (ref 4.22–5.81)
RDW: 15.3 % (ref 11.5–15.5)
WBC: 6.2 10*3/uL (ref 4.0–10.5)
nRBC: 0 % (ref 0.0–0.2)

## 2022-05-29 LAB — RETIC PANEL
Immature Retic Fract: 19.6 % — ABNORMAL HIGH (ref 2.3–15.9)
RBC.: 4.28 MIL/uL (ref 4.22–5.81)
Retic Count, Absolute: 78.8 10*3/uL (ref 19.0–186.0)
Retic Ct Pct: 1.8 % (ref 0.4–3.1)
Reticulocyte Hemoglobin: 35.5 pg (ref >27.9)

## 2022-05-29 LAB — FOLATE: Folate: 24 ng/mL (ref >5.9)

## 2022-05-29 LAB — IRON AND TIBC
Iron: 93 ug/dL (ref 45–182)
Saturation Ratios: 31 % (ref 17.9–39.5)
TIBC: 300 ug/dL (ref 250–450)
UIBC: 207 ug/dL

## 2022-05-29 LAB — FERRITIN: Ferritin: 68 ng/mL (ref 24–336)

## 2022-05-29 LAB — VITAMIN B12: Vitamin B-12: 223 pg/mL (ref 180–914)

## 2022-05-29 NOTE — Progress Notes (Signed)
Hematology/Oncology Consult note [phone] [phone]      Patient Care Team: Dale Bristow, MD as PCP - General (Internal Medicine) Iran Ouch, MD as PCP - Cardiology (Cardiology)   REFERRING PROVIDER: Dale Tequesta, MD  CHIEF COMPLAINTS/REASON FOR VISIT:  Iron Deficiency Anemia  ASSESSMENT & PLAN:  Iron deficiency anemia Labs are reviewed and discussed with patient. Lab Results  Component Value Date   HGB 13.2 05/29/2022   TIBC 300 05/29/2022   IRONPCTSAT 31 05/29/2022   FERRITIN 68 05/29/2022  Both hemoglobin and iron level have improved.  Hold off additional Venofer treatments.   Follow up as needed.  I recommend patient to continue follow up with primary care physician. Patient may re-establish care in the future if clinically indicated.   All questions were answered. The patient knows to call the clinic with any problems, questions or concerns.  Rickard Patience, MD, PhD [facility] Hematology Oncology 05/29/2022     HISTORY OF PRESENTING ILLNESS:  Shane Mcknight is a  72 y.o.  male with PMH listed below who was referred to me for anemia Reviewed patient's recent labs that was done.  Patient has chronic iron deficiency, onset of symptoms since at least February 2021. 11/06/2021, ferritin 9.9 02/06/2021, ferritin 70.3, iron saturation 46, TIBC 308.  Hemoglobin 11.8, Patient has history of gastritis.  He has previously seen gastroenterology at [facility] for dysphagia/esophageal stricture.  He has not followed up closely recently.  Intermittently he has "hemorrhoid bleeding" Patient has psoriatic arthritis  he is on methotrexate 20 mg weekly. Patient is on aspirin 81 mg daily.   INTERVAL HISTORY Shane Mcknight is a 71 y.o. male who has above history reviewed by me today presents for follow up visit for iron deficiency anemia  He tolerates Venofer treatments.  No new complaints. MEDICAL HISTORY:  Past Medical History:  Diagnosis Date    Allergic state    Anemia    Aortic atherosclerosis (HCC)    Arthritis    spine, hands, shoulder with previous cuff tear   B12 deficiency    CAD (coronary artery disease)    a.) 11/2015 CT: Coronary Ca2+ noted. b.) LHC 06/14/2016: 60% pRCA, 60% RPLB-2, 99% OM1, 50% mLAD, 40% oLM-LM, 30% pLAD, 60% oD1-D1, 60% oLCx, 95% dRCA; PCI placing a 2.5 x 15mm Resolute Onyx DES dRCA. c.) LHC 09/14/2018: 40% oLM-LM, 50% mLAD, 30% pLAD, 60% oD1-D1, 40% oLCx, 100% OM1, 60% p-mRCA, 50% RPAV; dRCA stent widely patent; medical mgmt.   Candidiasis of esophagus (HCC)    Carotid artery stenosis 04/17/2020   a.) Carotid doppler 04/17/2020: 40-59% RICA/LICA stenosis.   Colon polyp    COVID-19    09/2021   DDD (degenerative disc disease), lumbosacral    Diastolic dysfunction 04/06/2020   a.)  TTE 04/06/2020; EF 55-60%; mild LA dilation; no RWMAs; G2DD.   Dysphagia    Effort angina    Elevated PSA    Elevated transaminase level    Esophageal stricture    a.) s/p multiple dilations   Esophagitis, [place] grade D    Fatty liver    Ganglion cyst of finger of right hand    GERD (gastroesophageal reflux disease)    Guaiac positive stools    Hiatal hernia    HNP (herniated nucleus pulposus), lumbar    Hypercholesterolemia    Hypertension    Long term current use of immunosuppressive drug    a.) MTX + secukinumab for severe psoriatic arthritis   Lumbar radiculitis  Mallory-Weiss tear 11/2015   Migraine aura without headache    OSA (obstructive sleep apnea)    a.) does not use nocturnal PAP therapy   Psoriasis    Psoriatic arthritis (HCC)    a.) severe; Tx'd with MTX + secukinumab   PVC (premature ventricular contraction)    Schatzki's ring    Type II diabetes mellitus (HCC)     SURGICAL HISTORY: Past Surgical History:  Procedure Laterality Date   ANTERIOR CERVICAL DECOMP/DISCECTOMY FUSION N/A 04/13/2021   Procedure: C3-5 ANTERIOR CERVICAL DISCECTOMY AND FUSION (GLOBUS ALLOGRAFT);  Surgeon:  Venetia Night, MD;  Location: [facility] ORS;  Service: Neurosurgery;  Laterality: N/A;   ARTHROPLASTY     right thumb; left thumb with 2 screws and plastic joint   BALLOON DILATION N/A 05/29/2015   Procedure: BALLOON DILATION;  Surgeon: Christena Deem, MD;  Location: [facility] ENDOSCOPY;  Service: Endoscopy;  Laterality: N/A;   BALLOON DILATION N/A 02/14/2017   Procedure: BALLOON DILATION;  Surgeon: Christena Deem, MD;  Location: [facility] ENDOSCOPY;  Service: Endoscopy;  Laterality: N/A;   BILATERAL CARPAL TUNNEL RELEASE Bilateral 07/04/2017   Procedure: BILATERAL CARPAL TUNNEL RELEASE;  Surgeon: Erin Sons, MD;  Location: [facility] ORS;  Service: Orthopedics;  Laterality: Bilateral;   BLEPHAROPLASTY     BUNIONECTOMY Left 12/2020   CARDIAC CATHETERIZATION     1 stent    CARPAL TUNNEL RELEASE  2005   right   CHOLECYSTECTOMY  2008   COLONOSCOPY     COLONOSCOPY WITH PROPOFOL N/A 02/13/2018   Procedure: COLONOSCOPY WITH PROPOFOL;  Surgeon: Christena Deem, MD;  Location: [facility] ENDOSCOPY;  Service: Endoscopy;  Laterality: N/A;   CORONARY STENT INTERVENTION N/A 06/14/2016   Procedure: Coronary Stent Intervention;  Surgeon: Iran Ouch, MD;  Location: [facility] INVASIVE CV LAB;  Service: Cardiovascular;  Laterality: N/A;   CYST EXCISION  04/14/2015   tendon sheath cyst excision; right ring finger cyst removed and trigger finger realease   ESOPHAGOGASTRODUODENOSCOPY     ESOPHAGOGASTRODUODENOSCOPY (EGD) WITH PROPOFOL N/A 05/29/2015   Procedure: ESOPHAGOGASTRODUODENOSCOPY (EGD) WITH PROPOFOL;  Surgeon: Christena Deem, MD;  Location: [facility] ENDOSCOPY;  Service: Endoscopy;  Laterality: N/A;   ESOPHAGOGASTRODUODENOSCOPY (EGD) WITH PROPOFOL N/A 07/04/2015   Procedure: ESOPHAGOGASTRODUODENOSCOPY (EGD) WITH PROPOFOL;  Surgeon: Christena Deem, MD;  Location: [facility] ENDOSCOPY;  Service: Endoscopy;  Laterality: N/A;   ESOPHAGOGASTRODUODENOSCOPY (EGD) WITH PROPOFOL N/A 01/04/2016   Procedure:  ESOPHAGOGASTRODUODENOSCOPY (EGD) WITH PROPOFOL;  Surgeon: Christena Deem, MD;  Location: [facility] ENDOSCOPY;  Service: Endoscopy;  Laterality: N/A;   ESOPHAGOGASTRODUODENOSCOPY (EGD) WITH PROPOFOL N/A 03/22/2016   Procedure: ESOPHAGOGASTRODUODENOSCOPY (EGD) WITH PROPOFOL;  Surgeon: Christena Deem, MD;  Location: [facility] ENDOSCOPY;  Service: Endoscopy;  Laterality: N/A;   ESOPHAGOGASTRODUODENOSCOPY (EGD) WITH PROPOFOL N/A 02/14/2017   Procedure: ESOPHAGOGASTRODUODENOSCOPY (EGD) WITH PROPOFOL;  Surgeon: Christena Deem, MD;  Location: [facility] ENDOSCOPY;  Service: Endoscopy;  Laterality: N/A;   ESOPHAGOGASTRODUODENOSCOPY (EGD) WITH PROPOFOL N/A 04/11/2017   Procedure: ESOPHAGOGASTRODUODENOSCOPY (EGD) WITH PROPOFOL;  Surgeon: Christena Deem, MD;  Location: [facility] ENDOSCOPY;  Service: Endoscopy;  Laterality: N/A;   ESOPHAGOGASTRODUODENOSCOPY (EGD) WITH PROPOFOL N/A 07/07/2017   Procedure: ESOPHAGOGASTRODUODENOSCOPY (EGD) WITH PROPOFOL;  Surgeon: Midge Minium, MD;  Location: [facility] ENDOSCOPY;  Service: Endoscopy;  Laterality: N/A;   ESOPHAGOGASTRODUODENOSCOPY (EGD) WITH PROPOFOL N/A 02/13/2018   Procedure: ESOPHAGOGASTRODUODENOSCOPY (EGD) WITH PROPOFOL;  Surgeon: Christena Deem, MD;  Location: [facility] ENDOSCOPY;  Service: Endoscopy;  Laterality: N/A;   FOREIGN BODY REMOVAL N/A 12/03/2015   Procedure: FOREIGN BODY  REMOVAL;  Surgeon: Charlott Rakes, MD;  Location: [facility] ENDOSCOPY;  Service: Endoscopy;  Laterality: N/A;   HERNIA REPAIR Right 1970   inguinal   INGUINAL HERNIA REPAIR Left 01/01/2018   Procedure: OPEN HERNIA REPAIR INGUINAL ADULT;  Surgeon: Henrene Dodge, MD;  Location: [facility] ORS;  Service: General;  Laterality: Left;   LEFT HEART CATH Left 06/14/2016   Procedure: Left Heart Cath;  Surgeon: Iran Ouch, MD;  Location: [facility] INVASIVE CV LAB;  Service: Cardiovascular;  Laterality: Left;   LEFT HEART CATH AND CORONARY ANGIOGRAPHY Left 09/14/2018   Procedure: LEFT HEART CATH AND CORONARY  ANGIOGRAPHY;  Surgeon: Iran Ouch, MD;  Location: [facility] INVASIVE CV LAB;  Service: Cardiovascular;  Laterality: Left;   LUMBAR DISC SURGERY  2004   injections; herniated disc; percutaneous discectomy   NASAL SINUS SURGERY  2008   ROTATOR CUFF REPAIR Right    TONSILLECTOMY      SOCIAL HISTORY: Social History   Socioeconomic History   Marital status: Married    Spouse name: Britta Mccreedy   Number of children: Not on file   Years of education: Not on file   Highest education level: Not on file  Occupational History    Comment: not heavy work  Tobacco Use   Smoking status: Former    Packs/day: 0.25    Years: 7.00    Additional pack years: 0.00    Total pack years: 1.75    Types: Cigarettes    Quit date: 07/06/1982    Years since quitting: 39.9    Passive exposure: Past   Smokeless tobacco: Never  Vaping Use   Vaping Use: Never used  Substance and Sexual Activity   Alcohol use: Not Currently   Drug use: No   Sexual activity: Not on file  Other Topics Concern   Not on file  Social History Narrative   Lives locally with wife.  Active but does not routinely exercise.   Social Determinants of Health   Financial Resource Strain: Low Risk  (03/04/2022)   Overall Financial Resource Strain (CARDIA)    Difficulty of Paying Living Expenses: Not hard at all  Food Insecurity: No Food Insecurity (03/04/2022)   Hunger Vital Sign    Worried About Running Out of Food in the Last Year: Never true    Ran Out of Food in the Last Year: Never true  Transportation Needs: No Transportation Needs (03/04/2022)   PRAPARE - Administrator, Civil Service (Medical): No    Lack of Transportation (Non-Medical): No  Physical Activity: Unknown (03/04/2022)   Exercise Vital Sign    Days of Exercise per Week: 0 days    Minutes of Exercise per Session: Not on file  Stress: No Stress Concern Present (03/04/2022)   [employer] of Occupational Health - Occupational Stress Questionnaire     Feeling of Stress : Not at all  Social Connections: Unknown (03/04/2022)   Social Connection and Isolation Panel [NHANES]    Frequency of Communication with Friends and Family: Not on file    Frequency of Social Gatherings with Friends and Family: Not on file    Attends Religious Services: Not on file    Active Member of Clubs or Organizations: Not on file    Attends Banker Meetings: Not on file    Marital Status: Married  Intimate Partner Violence: Not At Risk (03/04/2022)   Humiliation, Afraid, Rape, and Kick questionnaire    Fear of Current or Ex-Partner: No  Emotionally Abused: No    Physically Abused: No    Sexually Abused: No    FAMILY HISTORY: Family History  Problem Relation Age of Onset   Lung cancer Father    Cancer Father    Hypertension Mother    Aneurysm Son     ALLERGIES:  is allergic to ephedrine, nsaids, other, vancomycin, amoxicillin, hyoscyamine sulfate, levsin [hyoscyamine sulfate], naproxen, penicillins, shellfish allergy, and sudafed [pseudoephedrine hcl].  MEDICATIONS:  Current Outpatient Medications  Medication Sig Dispense Refill   acetaminophen (TYLENOL) 500 MG tablet Take 1,000 mg by mouth daily as needed for moderate pain.     aspirin EC 81 MG tablet Take 81 mg by mouth every evening.      atenolol (TENORMIN) 50 MG tablet TAKE 1 TABLET BY MOUTH  TWICE DAILY 180 tablet 3   folic acid (FOLVITE) 1 MG tablet Take 1 mg by mouth daily.     gabapentin (NEURONTIN) 300 MG capsule TAKE 1 TO 2 CAPSULES BY  MOUTH AT BEDTIME (Patient taking differently: TAKE 1 TO 2 CAPSULES BY  MOUTH AT BEDTIME) 180 capsule 3   glipiZIDE (GLUCOTROL XL) 2.5 MG 24 hr tablet Take 1 tablet (2.5 mg total) by mouth daily with breakfast. 30 tablet 0   glucose blood (ONETOUCH ULTRA) test strip Used to check blood glucose one time per day. 100 each 12   isosorbide mononitrate (IMDUR) 30 MG 24 hr tablet TAKE ONE-HALF TABLET BY MOUTH  DAILY 45 tablet 3   losartan (COZAAR) 25 MG  tablet TAKE ONE-HALF TABLET BY MOUTH  DAILY 45 tablet 3   melatonin 5 MG TABS Take 5 mg by mouth at bedtime as needed.     methotrexate (RHEUMATREX) 2.5 MG tablet Take 2.5 mg by mouth once a week. Pt states he takes 8 (2.5mg ) tablets weekly     nitroGLYCERIN (NITROSTAT) 0.4 MG SL tablet Place 1 tablet (0.4 mg total) under the tongue every 5 (five) minutes as needed. MAXIMUM OF 3 DOSES. 25 tablet 6   pantoprazole (PROTONIX) 40 MG tablet TAKE 1 TABLET BY MOUTH TWICE  DAILY 180 tablet 3   rosuvastatin (CRESTOR) 20 MG tablet Take 1 tablet (20 mg total) by mouth daily. 90 tablet 3   SYRINGE-NEEDLE, DISP, 3 ML 25G X 5/8" 3 ML MISC Use as instructed with B12 injection. 50 each 11   vitamin E 400 UNIT capsule Take 400 Units by mouth in the morning.     No current facility-administered medications for this visit.    Review of Systems  Constitutional:  Positive for fatigue. Negative for appetite change, chills, fever and unexpected weight change.  HENT:   Negative for hearing loss and voice change.   Eyes:  Negative for eye problems and icterus.  Respiratory:  Negative for chest tightness, cough and shortness of breath.   Cardiovascular:  Negative for chest pain and leg swelling.  Gastrointestinal:  Negative for abdominal distention and abdominal pain.  Endocrine: Negative for hot flashes.  Genitourinary:  Negative for difficulty urinating, dysuria and frequency.   Musculoskeletal:  Negative for arthralgias.  Skin:  Negative for itching and rash.  Neurological:  Negative for light-headedness and numbness.  Hematological:  Negative for adenopathy. Does not bruise/bleed easily.  Psychiatric/Behavioral:  Negative for confusion.     PHYSICAL EXAMINATION: ECOG PERFORMANCE STATUS: 1 - Symptomatic but completely ambulatory Vitals:   05/29/22 1501  BP: (!) 159/80  Pulse: 69  Temp: 98 F (36.7 C)  SpO2: 100%   Filed Weights  05/29/22 1501  Weight: 181 lb 14.4 oz (82.5 kg)    Physical  Exam Constitutional:      General: He is not in acute distress. HENT:     Head: Normocephalic and atraumatic.  Eyes:     General: No scleral icterus. Cardiovascular:     Rate and Rhythm: Normal rate and regular rhythm.     Heart sounds: Normal heart sounds.  Pulmonary:     Effort: Pulmonary effort is normal. No respiratory distress.     Breath sounds: No wheezing.  Abdominal:     General: Bowel sounds are normal. There is no distension.     Palpations: Abdomen is soft.  Musculoskeletal:        General: No deformity. Normal range of motion.     Cervical back: Normal range of motion and neck supple.  Skin:    General: Skin is warm and dry.     Findings: No erythema or rash.  Neurological:     Mental Status: He is alert and oriented to person, place, and time. Mental status is at baseline.     Cranial Nerves: No cranial nerve deficit.     Coordination: Coordination normal.  Psychiatric:        Mood and Affect: Mood normal.      LABORATORY DATA:  I have reviewed the data as listed    Latest Ref Rng & Units 05/29/2022    2:51 PM 02/06/2022   10:18 AM 12/11/2021    3:25 PM  CBC  WBC 4.0 - 10.5 K/uL 6.2  6.1  5.7   Hemoglobin 13.0 - 17.0 g/dL 04.5  40.9  81.1   Hematocrit 39.0 - 52.0 % 37.7  35.5  33.0   Platelets 150 - 400 K/uL 155  227.0  180.0       Latest Ref Rng & Units 02/06/2022    3:27 PM 11/05/2021    9:21 AM 04/13/2021    3:31 PM  CMP  Glucose 70 - 99 mg/dL 914  782    BUN 6 - 23 mg/dL 14  8    Creatinine 9.56 - 1.50 mg/dL 2.13  0.86  5.78   Sodium 135 - 145 mEq/L 140  139    Potassium 3.5 - 5.1 mEq/L 4.1  3.8    Chloride 96 - 112 mEq/L 105  103    CO2 19 - 32 mEq/L 25  28    Calcium 8.4 - 10.5 mg/dL 9.2  9.3    Total Protein 6.0 - 8.3 g/dL 6.7  7.3    Total Bilirubin 0.2 - 1.2 mg/dL 0.4  0.5    Alkaline Phos 39 - 117 U/L 50  57    AST 0 - 37 U/L 13  16    ALT 0 - 53 U/L 12  13        Component Value Date/Time   IRON 93 05/29/2022 1451   TIBC 300  05/29/2022 1451   FERRITIN 68 05/29/2022 1451   IRONPCTSAT 31 05/29/2022 1451     RADIOGRAPHIC STUDIES: I have personally reviewed the radiological images as listed and agreed with the findings in the report. No results found.

## 2022-05-29 NOTE — Assessment & Plan Note (Addendum)
Labs are reviewed and discussed with patient. Lab Results  Component Value Date   HGB 13.2 05/29/2022   TIBC 300 05/29/2022   IRONPCTSAT 31 05/29/2022   FERRITIN 68 05/29/2022  Both hemoglobin and iron level have improved.  Hold off additional Venofer treatments.

## 2022-06-06 DIAGNOSIS — D509 Iron deficiency anemia, unspecified: Secondary | ICD-10-CM | POA: Diagnosis not present

## 2022-06-06 DIAGNOSIS — K2100 Gastro-esophageal reflux disease with esophagitis, without bleeding: Secondary | ICD-10-CM | POA: Diagnosis not present

## 2022-08-02 DIAGNOSIS — H04221 Epiphora due to insufficient drainage, right lacrimal gland: Secondary | ICD-10-CM | POA: Diagnosis not present

## 2022-08-02 DIAGNOSIS — G43109 Migraine with aura, not intractable, without status migrainosus: Secondary | ICD-10-CM | POA: Diagnosis not present

## 2022-08-02 DIAGNOSIS — E119 Type 2 diabetes mellitus without complications: Secondary | ICD-10-CM | POA: Diagnosis not present

## 2022-08-02 DIAGNOSIS — H2513 Age-related nuclear cataract, bilateral: Secondary | ICD-10-CM | POA: Diagnosis not present

## 2022-08-02 LAB — HM DIABETES EYE EXAM

## 2022-08-05 ENCOUNTER — Ambulatory Visit: Payer: Medicare Other | Admitting: Anesthesiology

## 2022-08-05 ENCOUNTER — Ambulatory Visit
Admission: RE | Admit: 2022-08-05 | Discharge: 2022-08-05 | Disposition: A | Discharge status: Home / Self Care | Payer: Medicare Other | Source: Home / Self Care | Attending: Gastroenterology | Admitting: Gastroenterology

## 2022-08-05 ENCOUNTER — Encounter
Admission: RE | Disposition: A | Discharge status: Home / Self Care | Payer: Self-pay | Source: Home / Self Care | Attending: Gastroenterology

## 2022-08-05 DIAGNOSIS — K640 First degree hemorrhoids: Secondary | ICD-10-CM | POA: Diagnosis not present

## 2022-08-05 DIAGNOSIS — I5022 Chronic systolic (congestive) heart failure: Secondary | ICD-10-CM | POA: Diagnosis not present

## 2022-08-05 DIAGNOSIS — Z9049 Acquired absence of other specified parts of digestive tract: Secondary | ICD-10-CM | POA: Insufficient documentation

## 2022-08-05 DIAGNOSIS — N183 Chronic kidney disease, stage 3 unspecified: Secondary | ICD-10-CM | POA: Diagnosis not present

## 2022-08-05 DIAGNOSIS — D509 Iron deficiency anemia, unspecified: Secondary | ICD-10-CM | POA: Diagnosis not present

## 2022-08-05 DIAGNOSIS — K449 Diaphragmatic hernia without obstruction or gangrene: Secondary | ICD-10-CM | POA: Insufficient documentation

## 2022-08-05 DIAGNOSIS — I25119 Atherosclerotic heart disease of native coronary artery with unspecified angina pectoris: Secondary | ICD-10-CM | POA: Diagnosis not present

## 2022-08-05 DIAGNOSIS — Z7982 Long term (current) use of aspirin: Secondary | ICD-10-CM | POA: Diagnosis not present

## 2022-08-05 DIAGNOSIS — K2970 Gastritis, unspecified, without bleeding: Secondary | ICD-10-CM | POA: Insufficient documentation

## 2022-08-05 DIAGNOSIS — D125 Benign neoplasm of sigmoid colon: Secondary | ICD-10-CM | POA: Insufficient documentation

## 2022-08-05 DIAGNOSIS — K222 Esophageal obstruction: Secondary | ICD-10-CM | POA: Diagnosis not present

## 2022-08-05 DIAGNOSIS — Z7984 Long term (current) use of oral hypoglycemic drugs: Secondary | ICD-10-CM | POA: Diagnosis not present

## 2022-08-05 DIAGNOSIS — E0822 Diabetes mellitus due to underlying condition with diabetic chronic kidney disease: Secondary | ICD-10-CM | POA: Diagnosis not present

## 2022-08-05 DIAGNOSIS — K635 Polyp of colon: Secondary | ICD-10-CM | POA: Diagnosis not present

## 2022-08-05 DIAGNOSIS — K5790 Diverticulosis of intestine, part unspecified, without perforation or abscess without bleeding: Secondary | ICD-10-CM | POA: Diagnosis not present

## 2022-08-05 DIAGNOSIS — K5730 Diverticulosis of large intestine without perforation or abscess without bleeding: Secondary | ICD-10-CM | POA: Diagnosis not present

## 2022-08-05 DIAGNOSIS — I130 Hypertensive heart and chronic kidney disease with heart failure and stage 1 through stage 4 chronic kidney disease, or unspecified chronic kidney disease: Secondary | ICD-10-CM | POA: Diagnosis not present

## 2022-08-05 DIAGNOSIS — Z87891 Personal history of nicotine dependence: Secondary | ICD-10-CM | POA: Diagnosis not present

## 2022-08-05 DIAGNOSIS — K2950 Unspecified chronic gastritis without bleeding: Secondary | ICD-10-CM | POA: Diagnosis not present

## 2022-08-05 HISTORY — PX: POLYPECTOMY: SHX5525

## 2022-08-05 HISTORY — PX: ESOPHAGOGASTRODUODENOSCOPY (EGD) WITH PROPOFOL: SHX5813

## 2022-08-05 HISTORY — PX: COLONOSCOPY WITH PROPOFOL: SHX5780

## 2022-08-05 HISTORY — PX: BIOPSY: SHX5522

## 2022-08-05 LAB — GLUCOSE, CAPILLARY: Glucose-Capillary: 184 mg/dL — ABNORMAL HIGH (ref 70–99)

## 2022-08-05 SURGERY — ESOPHAGOGASTRODUODENOSCOPY (EGD) WITH PROPOFOL
Anesthesia: General

## 2022-08-05 MED ORDER — PROPOFOL 500 MG/50ML IV EMUL
INTRAVENOUS | Status: DC | PRN
  Administered 2022-08-05: 150 ug/kg/min via INTRAVENOUS

## 2022-08-05 MED ORDER — SODIUM CHLORIDE 0.9 % IV SOLN
INTRAVENOUS | Status: DC
  Administered 2022-08-05: 20 mL/h via INTRAVENOUS

## 2022-08-05 MED ORDER — LIDOCAINE HCL (CARDIAC) PF 100 MG/5ML IV SOSY
PREFILLED_SYRINGE | INTRAVENOUS | Status: DC | PRN
  Administered 2022-08-05: 25 mg via INTRAVENOUS

## 2022-08-05 MED ORDER — PHENYLEPHRINE 80 MCG/ML (10ML) SYRINGE FOR IV PUSH (FOR BLOOD PRESSURE SUPPORT)
PREFILLED_SYRINGE | INTRAVENOUS | Status: DC | PRN
  Administered 2022-08-05 (×2): 160 ug via INTRAVENOUS

## 2022-08-05 MED ORDER — PROPOFOL 10 MG/ML IV BOLUS
INTRAVENOUS | Status: DC | PRN
  Administered 2022-08-05: 50 mg via INTRAVENOUS
  Administered 2022-08-05 (×2): 20 mg via INTRAVENOUS

## 2022-08-05 NOTE — Anesthesia Preprocedure Evaluation (Signed)
Anesthesia Evaluation  Patient identified by MRN, date of birth, ID band Patient awake    Reviewed: Allergy & Precautions, NPO status , Patient's Chart, lab work & pertinent test results  History of Anesthesia Complications Negative for: history of anesthetic complications  Airway Mallampati: III  TM Distance: <3 FB Neck ROM: full    Dental  (+) Upper Dentures, Lower Dentures   Pulmonary neg shortness of breath, sleep apnea , former smoker   Pulmonary exam normal        Cardiovascular Exercise Tolerance: Good hypertension, (-) angina + CAD and + Cardiac Stents  Normal cardiovascular exam     Neuro/Psych  Headaches  Neuromuscular disease  negative psych ROS   GI/Hepatic Neg liver ROS, hiatal hernia,GERD  Controlled,,  Endo/Other  diabetes, Type 2    Renal/GU Renal disease  negative genitourinary   Musculoskeletal   Abdominal   Peds  Hematology negative hematology ROS (+)   Anesthesia Other Findings Past Medical History: No date: Allergic state No date: Anemia No date: Aortic atherosclerosis (HCC) No date: Arthritis     Comment:  spine, hands, shoulder with previous cuff tear No date: B12 deficiency No date: CAD (coronary artery disease)     Comment:  a.) 11/2015 CT: Coronary Ca2+ noted. b.) LHC 06/14/2016:              60% pRCA, 60% RPLB-2, 99% OM1, 50% mLAD, 40% oLM-LM, 30%               pLAD, 60% oD1-D1, 60% oLCx, 95% dRCA; PCI placing a 2.5 x              15mm Resolute Onyx DES dRCA. c.) LHC 09/14/2018: 40%               oLM-LM, 50% mLAD, 30% pLAD, 60% oD1-D1, 40% oLCx, 100%               OM1, 60% p-mRCA, 50% RPAV; dRCA stent widely patent;               medical mgmt. No date: Candidiasis of esophagus (HCC) 04/17/2020: Carotid artery stenosis     Comment:  a.) Carotid doppler 04/17/2020: 40-59% RICA/LICA               stenosis. No date: Colon polyp No date: COVID-19     Comment:  09/2021 No date: DDD  (degenerative disc disease), lumbosacral 04/06/2020: Diastolic dysfunction     Comment:  a.)  TTE 04/06/2020; EF 55-60%; mild LA dilation; no               RWMAs; G2DD. No date: Dysphagia No date: Effort angina No date: Elevated PSA No date: Elevated transaminase level No date: Esophageal stricture     Comment:  a.) s/p multiple dilations No date: Esophagitis, [place] grade D No date: Fatty liver No date: Ganglion cyst of finger of right hand No date: GERD (gastroesophageal reflux disease) No date: Guaiac positive stools No date: Hiatal hernia No date: HNP (herniated nucleus pulposus), lumbar No date: Hypercholesterolemia No date: Hypertension No date: Long term current use of immunosuppressive drug     Comment:  a.) MTX + secukinumab for severe psoriatic arthritis No date: Lumbar radiculitis 11/2015: Mallory-Weiss tear No date: Migraine aura without headache No date: OSA (obstructive sleep apnea)     Comment:  a.) does not use nocturnal PAP therapy No date: Psoriasis No date: Psoriatic arthritis (HCC)     Comment:  a.) severe; Tx'd  with MTX + secukinumab No date: PVC (premature ventricular contraction) No date: Schatzki's ring No date: Type II diabetes mellitus (HCC)  Past Surgical History: 04/13/2021: ANTERIOR CERVICAL DECOMP/DISCECTOMY FUSION; N/A     Comment:  Procedure: C3-5 ANTERIOR CERVICAL DISCECTOMY AND FUSION               (GLOBUS ALLOGRAFT);  Surgeon: Venetia Night, MD;                Location: [facility] ORS;  Service: Neurosurgery;  Laterality:               N/A; No date: ARTHROPLASTY     Comment:  right thumb; left thumb with 2 screws and plastic joint 05/29/2015: BALLOON DILATION; N/A     Comment:  Procedure: BALLOON DILATION;  Surgeon: Christena Deem, MD;  Location: [facility] ENDOSCOPY;  Service:               Endoscopy;  Laterality: N/A; 02/14/2017: BALLOON DILATION; N/A     Comment:  Procedure: BALLOON DILATION;  Surgeon: Christena Deem, MD;  Location: [facility] ENDOSCOPY;  Service: Endoscopy;                Laterality: N/A; 07/04/2017: BILATERAL CARPAL TUNNEL RELEASE; Bilateral     Comment:  Procedure: BILATERAL CARPAL TUNNEL RELEASE;  Surgeon:               Erin Sons, MD;  Location: [facility] ORS;  Service:               Orthopedics;  Laterality: Bilateral; No date: BLEPHAROPLASTY 12/2020: BUNIONECTOMY; Left No date: CARDIAC CATHETERIZATION     Comment:  1 stent  2005: CARPAL TUNNEL RELEASE     Comment:  right 2008: CHOLECYSTECTOMY No date: COLONOSCOPY 02/13/2018: COLONOSCOPY WITH PROPOFOL; N/A     Comment:  Procedure: COLONOSCOPY WITH PROPOFOL;  Surgeon:               Christena Deem, MD;  Location: [facility] ENDOSCOPY;                Service: Endoscopy;  Laterality: N/A; 06/14/2016: CORONARY STENT INTERVENTION; N/A     Comment:  Procedure: Coronary Stent Intervention;  Surgeon: Iran Ouch, MD;  Location: [facility] INVASIVE CV LAB;                Service: Cardiovascular;  Laterality: N/A; 04/14/2015: CYST EXCISION     Comment:  tendon sheath cyst excision; right ring finger cyst               removed and trigger finger realease No date: ESOPHAGOGASTRODUODENOSCOPY 05/29/2015: ESOPHAGOGASTRODUODENOSCOPY (EGD) WITH PROPOFOL; N/A     Comment:  Procedure: ESOPHAGOGASTRODUODENOSCOPY (EGD) WITH               PROPOFOL;  Surgeon: Christena Deem, MD;  Location:               [facility] ENDOSCOPY;  Service: Endoscopy;  Laterality: N/A; 07/04/2015: ESOPHAGOGASTRODUODENOSCOPY (EGD) WITH PROPOFOL; N/A     Comment:  Procedure: ESOPHAGOGASTRODUODENOSCOPY (EGD) WITH               PROPOFOL;  Surgeon: Christena Deem, MD;  Location:  [facility] ENDOSCOPY;  Service: Endoscopy;  Laterality: N/A; 01/04/2016: ESOPHAGOGASTRODUODENOSCOPY (EGD) WITH PROPOFOL; N/A     Comment:  Procedure: ESOPHAGOGASTRODUODENOSCOPY (EGD) WITH               PROPOFOL;  Surgeon: Christena Deem, MD;  Location:                [facility] ENDOSCOPY;  Service: Endoscopy;  Laterality: N/A; 03/22/2016: ESOPHAGOGASTRODUODENOSCOPY (EGD) WITH PROPOFOL; N/A     Comment:  Procedure: ESOPHAGOGASTRODUODENOSCOPY (EGD) WITH               PROPOFOL;  Surgeon: Christena Deem, MD;  Location:               [facility] ENDOSCOPY;  Service: Endoscopy;  Laterality: N/A; 02/14/2017: ESOPHAGOGASTRODUODENOSCOPY (EGD) WITH PROPOFOL; N/A     Comment:  Procedure: ESOPHAGOGASTRODUODENOSCOPY (EGD) WITH               PROPOFOL;  Surgeon: Christena Deem, MD;  Location:               [facility] ENDOSCOPY;  Service: Endoscopy;  Laterality: N/A; 04/11/2017: ESOPHAGOGASTRODUODENOSCOPY (EGD) WITH PROPOFOL; N/A     Comment:  Procedure: ESOPHAGOGASTRODUODENOSCOPY (EGD) WITH               PROPOFOL;  Surgeon: Christena Deem, MD;  Location:               [facility] ENDOSCOPY;  Service: Endoscopy;  Laterality: N/A; 07/07/2017: ESOPHAGOGASTRODUODENOSCOPY (EGD) WITH PROPOFOL; N/A     Comment:  Procedure: ESOPHAGOGASTRODUODENOSCOPY (EGD) WITH               PROPOFOL;  Surgeon: Midge Minium, MD;  Location: [facility]               ENDOSCOPY;  Service: Endoscopy;  Laterality: N/A; 02/13/2018: ESOPHAGOGASTRODUODENOSCOPY (EGD) WITH PROPOFOL; N/A     Comment:  Procedure: ESOPHAGOGASTRODUODENOSCOPY (EGD) WITH               PROPOFOL;  Surgeon: Christena Deem, MD;  Location:               [facility] ENDOSCOPY;  Service: Endoscopy;  Laterality: N/A; 12/03/2015: FOREIGN BODY REMOVAL; N/A     Comment:  Procedure: FOREIGN BODY REMOVAL;  Surgeon: Charlott Rakes, MD;  Location: [facility] ENDOSCOPY;  Service:               Endoscopy;  Laterality: N/A; 1970: HERNIA REPAIR; Right     Comment:  inguinal 01/01/2018: INGUINAL HERNIA REPAIR; Left     Comment:  Procedure: OPEN HERNIA REPAIR INGUINAL ADULT;  Surgeon:               Henrene Dodge, MD;  Location: [facility] ORS;  Service:               General;  Laterality: Left; 06/14/2016: LEFT HEART CATH; Left     Comment:  Procedure: Left  Heart Cath;  Surgeon: Iran Ouch,              MD;  Location: [facility] INVASIVE CV LAB;  Service:               Cardiovascular;  Laterality: Left; 09/14/2018: LEFT HEART CATH AND CORONARY ANGIOGRAPHY; Left     Comment:  Procedure: LEFT HEART CATH AND CORONARY ANGIOGRAPHY;  Surgeon: Iran Ouch, MD;  Location: [facility] INVASIVE               CV LAB;  Service: Cardiovascular;  Laterality: Left; 2004: LUMBAR DISC SURGERY     Comment:  injections; herniated disc; percutaneous discectomy 2008: NASAL SINUS SURGERY No date: ROTATOR CUFF REPAIR; Right No date: TONSILLECTOMY  BMI    Body Mass Index: 25.56 kg/m      Reproductive/Obstetrics negative OB ROS                             Anesthesia Physical Anesthesia Plan  ASA: 3  Anesthesia Plan: General   Post-op Pain Management:    Induction: Intravenous  PONV Risk Score and Plan: Propofol infusion and TIVA  Airway Management Planned: Natural Airway and Nasal Cannula  Additional Equipment:   Intra-op Plan:   Post-operative Plan:   Informed Consent: I have reviewed the patients History and Physical, chart, labs and discussed the procedure including the risks, benefits and alternatives for the proposed anesthesia with the patient or authorized representative who has indicated his/her understanding and acceptance.     Dental Advisory Given  Plan Discussed with: Anesthesiologist, CRNA and Surgeon  Anesthesia Plan Comments: (Patient consented for risks of anesthesia including but not limited to:  - adverse reactions to medications - risk of airway placement if required - damage to eyes, teeth, lips or other oral mucosa - nerve damage due to positioning  - sore throat or hoarseness - Damage to heart, brain, nerves, lungs, other parts of body or loss of life  Patient voiced understanding.)       Anesthesia Quick Evaluation

## 2022-08-05 NOTE — Transfer of Care (Signed)
Immediate Anesthesia Transfer of Care Note  Patient: Shane Mcknight  Procedure(s) Performed: ESOPHAGOGASTRODUODENOSCOPY (EGD) WITH PROPOFOL COLONOSCOPY WITH PROPOFOL Balloon dilation wire-guided BIOPSY POLYPECTOMY  Patient Location: Endoscopy Unit  Anesthesia Type:MAC  Level of Consciousness: awake, drowsy, and patient cooperative  Airway & Oxygen Therapy: Patient Spontanous Breathing  Post-op Assessment: Report given to RN, Post -op Vital signs reviewed and stable, and Patient moving all extremities X 4  Post vital signs: Reviewed and stable  Last Vitals:  Vitals Value Taken Time  BP 92/60 08/05/22 1028  Temp 35.8 C 08/05/22 1025  Pulse 67 08/05/22 1028  Resp 27 08/05/22 1028  SpO2 99 % 08/05/22 1028  Vitals shown include unfiled device data.  Last Pain:  Vitals:   08/05/22 1025  TempSrc: Temporal  PainSc: Asleep         Complications: No notable events documented.

## 2022-08-05 NOTE — Op Note (Signed)
[facility] Patient Name: Shane Mcknight Procedure Date: 08/05/2022 9:24 AM MRN: [id number] Account #: [id number] Date of Birth: [birth year] Admit Type: Outpatient Age: 72 Room: [facility] ENDO ROOM 3 Gender: Male Note Status: Finalized Instrument Name: Upper Endoscope [id number] Procedure:             Upper GI endoscopy Indications:           Iron deficiency anemia Providers:             Eather Colas MD, MD Referring MD:          Dale [place], MD (Referring MD) Medicines:             Monitored Anesthesia Care Complications:         No immediate complications. Estimated blood loss:                         Minimal. Procedure:             Pre-Anesthesia Assessment:                        - Prior to the procedure, a History and Physical was                         performed, and patient medications and allergies were                         reviewed. The patient is competent. The risks and                         benefits of the procedure and the sedation options and                         risks were discussed with the patient. All questions                         were answered and informed consent was obtained.                         Patient identification and proposed procedure were                         verified by the physician, the nurse, the                         anesthesiologist, the anesthetist and the technician                         in the endoscopy suite. Mental Status Examination:                         alert and oriented. Airway Examination: normal                         oropharyngeal airway and neck mobility. Respiratory                         Examination: clear to auscultation. CV Examination:  normal. Prophylactic Antibiotics: The patient does not                         require prophylactic antibiotics. Prior                         Anticoagulants: The patient has taken no anticoagulant                          or antiplatelet agents except for aspirin. ASA Grade                         Assessment: III - A patient with severe systemic                         disease. After reviewing the risks and benefits, the                         patient was deemed in satisfactory condition to                         undergo the procedure. The anesthesia plan was to use                         monitored anesthesia care (MAC). Immediately prior to                         administration of medications, the patient was                         re-assessed for adequacy to receive sedatives. The                         heart rate, respiratory rate, oxygen saturations,                         blood pressure, adequacy of pulmonary ventilation, and                         response to care were monitored throughout the                         procedure. The physical status of the patient was                         re-assessed after the procedure.                        After obtaining informed consent, the endoscope was                         passed under direct vision. Throughout the procedure,                         the patient's blood pressure, pulse, and oxygen                         saturations were monitored continuously. The Endoscope  was introduced through the mouth, and advanced to the                         second part of duodenum. The upper GI endoscopy was                         somewhat difficult due to narrowing at the UES. A                         neonatal scope was used to make sure no high stricture                         was seen before retrying with the regular EGD scope.                         The regular EGD scope was able to pass on 2nd attempt. Findings:      One benign-appearing, intrinsic severe (stenosis; an endoscope cannot       pass) stenosis was found. This stenosis measured 9 mm (inner diameter) x       less than one cm (in length). The stenosis was  traversed. A TTS dilator       was passed through the scope. Dilation with a 12-13.5-15 mm balloon       dilator was performed to 13.5 mm. The dilation site was examined and       showed moderate mucosal disruption. Estimated blood loss was minimal.      A 5 cm hiatal hernia was present.      Patchy mild inflammation characterized by erythema was found in the       gastric antrum. Biopsies were taken with a cold forceps for Helicobacter       pylori testing. Estimated blood loss was minimal.      The examined duodenum was normal. Impression:            - Benign-appearing esophageal stenosis. Dilated.                        - 5 cm hiatal hernia.                        - Gastritis. Biopsied.                        - Normal examined duodenum. Recommendation:        - Discharge patient to home.                        - Resume previous diet.                        - Continue present medications.                        - Await pathology results.                        - Repeat upper endoscopy at appointment to be                         scheduled for retreatment depending on dysphagia  symptoms.                        - Return to referring physician as previously                         scheduled. Procedure Code(s):     --- Professional ---                        [phone], Esophagogastroduodenoscopy, flexible,                         transoral; with transendoscopic balloon dilation of                         esophagus (less than 30 mm diameter) Diagnosis Code(s):     --- Professional ---                        K22.2, Esophageal obstruction                        K44.9, Diaphragmatic hernia without obstruction or                         gangrene                        K29.70, Gastritis, unspecified, without bleeding                        D50.9, Iron deficiency anemia, unspecified CPT copyright 2022 American Medical Association. All rights reserved. The codes documented  in this report are preliminary and upon coder review may  be revised to meet current compliance requirements. Eather Colas MD, MD 08/05/2022 10:33:23 AM Number of Addenda: 0 Note Initiated On: 08/05/2022 9:24 AM Estimated Blood Loss:  Estimated blood loss was minimal.      [facility]

## 2022-08-05 NOTE — Op Note (Signed)
[facility] Patient Name: Shane Mcknight Procedure Date: 08/05/2022 9:23 AM MRN: [id number] Account #: [id number] Date of Birth: 1950/12/28 Admit Type: Outpatient Age: 72 Room: [facility] ENDO ROOM 3 Gender: Male Note Status: Finalized Instrument Name: Nelda Marseille [id number] Procedure:             Colonoscopy Indications:           Iron deficiency anemia Providers:             Eather Colas MD, MD Referring MD:          Dale [place], MD (Referring MD) Medicines:             Monitored Anesthesia Care Complications:         No immediate complications. Estimated blood loss:                         Minimal. Procedure:             Pre-Anesthesia Assessment:                        - Prior to the procedure, a History and Physical was                         performed, and patient medications and allergies were                         reviewed. The patient is competent. The risks and                         benefits of the procedure and the sedation options and                         risks were discussed with the patient. All questions                         were answered and informed consent was obtained.                         Patient identification and proposed procedure were                         verified by the physician, the nurse, the                         anesthesiologist, the anesthetist and the technician                         in the endoscopy suite. Mental Status Examination:                         alert and oriented. Airway Examination: normal                         oropharyngeal airway and neck mobility. Respiratory                         Examination: clear to auscultation. CV Examination:  normal. Prophylactic Antibiotics: The patient does not                         require prophylactic antibiotics. Prior                         Anticoagulants: The patient has taken no anticoagulant                         or  antiplatelet agents. ASA Grade Assessment: III - A                         patient with severe systemic disease. After reviewing                         the risks and benefits, the patient was deemed in                         satisfactory condition to undergo the procedure. The                         anesthesia plan was to use monitored anesthesia care                         (MAC). Immediately prior to administration of                         medications, the patient was re-assessed for adequacy                         to receive sedatives. The heart rate, respiratory                         rate, oxygen saturations, blood pressure, adequacy of                         pulmonary ventilation, and response to care were                         monitored throughout the procedure. The physical                         status of the patient was re-assessed after the                         procedure.                        After obtaining informed consent, the colonoscope was                         passed under direct vision. Throughout the procedure,                         the patient's blood pressure, pulse, and oxygen                         saturations were monitored continuously. The  Colonoscope was introduced through the anus and                         advanced to the the cecum, identified by appendiceal                         orifice and ileocecal valve. The colonoscopy was                         performed without difficulty. The patient tolerated                         the procedure well. The quality of the bowel                         preparation was good. The ileocecal valve, appendiceal                         orifice, and rectum were photographed. Findings:      The perianal and digital rectal examinations were normal.      Two sessile polyps were found in the sigmoid colon. The polyps were 2 to       3 mm in size. These polyps were removed with a  cold snare. Resection and       retrieval were complete. Estimated blood loss was minimal.      Multiple small-mouthed diverticula were found in the sigmoid colon.      Internal hemorrhoids were found during retroflexion. The hemorrhoids       were Grade I (internal hemorrhoids that do not prolapse).      The exam was otherwise without abnormality on direct and retroflexion       views. Impression:            - Two 2 to 3 mm polyps in the sigmoid colon, removed                         with a cold snare. Resected and retrieved.                        - Diverticulosis in the sigmoid colon.                        - Internal hemorrhoids.                        - The examination was otherwise normal on direct and                         retroflexion views. Recommendation:        - Discharge patient to home.                        - Resume previous diet.                        - Continue present medications.                        - Await pathology results.                        -  Repeat colonoscopy is not recommended due to current                         age (15 years or older) for surveillance.                        - Return to referring physician as previously                         scheduled. Procedure Code(s):     --- Professional ---                        [phone], Colonoscopy, flexible; with removal of                         tumor(s), polyp(s), or other lesion(s) by snare                         technique Diagnosis Code(s):     --- Professional ---                        D12.5, Benign neoplasm of sigmoid colon                        K64.0, First degree hemorrhoids                        D50.9, Iron deficiency anemia, unspecified                        K57.30, Diverticulosis of large intestine without                         perforation or abscess without bleeding CPT copyright 2022 American Medical Association. All rights reserved. The codes documented in this report are  preliminary and upon coder review may  be revised to meet current compliance requirements. Eather Colas MD, MD 08/05/2022 10:36:24 AM Number of Addenda: 0 Note Initiated On: 08/05/2022 9:23 AM Scope Withdrawal Time: 0 hours 10 minutes 9 seconds  Total Procedure Duration: 0 hours 13 minutes 54 seconds  Estimated Blood Loss:  Estimated blood loss was minimal.      [facility]

## 2022-08-05 NOTE — Interval H&P Note (Signed)
History and Physical Interval Note:  08/05/2022 9:43 AM  Shane Mcknight  has presented today for surgery, with the diagnosis of IDA,.  The various methods of treatment have been discussed with the patient and family. After consideration of risks, benefits and other options for treatment, the patient has consented to  Procedure(s): ESOPHAGOGASTRODUODENOSCOPY (EGD) WITH PROPOFOL (N/A) COLONOSCOPY WITH PROPOFOL (N/A) as a surgical intervention.  [name] history has been reviewed, patient examined, no change in status, stable for surgery.  I have reviewed the patient's chart and labs.  Questions were answered to the patient's satisfaction.     [name]  Ok to proceed with EGD/Colonoscopy

## 2022-08-05 NOTE — H&P (Signed)
Outpatient short stay form Pre-procedure 08/05/2022  Shane Bill, MD  Primary Physician: Dale Terrell, MD  Reason for visit:  IDA  History of present illness:    72 y/o gentleman with history of CAD, DM II, recurrent esophageal strictures, and arthritis here for EGD/Colonoscopy for IDA. Last EGD in 2022 with dilation to 15 fr. Last colonoscopy in 2020 with just diverticulosis. History of cholecystectomy. No blood thinners except aspirin. No family history of GI malignancies.    Current Facility-Administered Medications:    0.9 %  sodium chloride infusion, , Intravenous, Continuous, Ezekiah Massie, Rossie Muskrat, MD, Last Rate: 20 mL/hr at 08/05/22 0934, 20 mL/hr at 08/05/22 0934  Medications Prior to Admission  Medication Sig Dispense Refill Last Dose   acetaminophen (TYLENOL) 500 MG tablet Take 1,000 mg by mouth daily as needed for moderate pain.   Past Week   aspirin EC 81 MG tablet Take 81 mg by mouth every evening.    Past Week   atenolol (TENORMIN) 50 MG tablet TAKE 1 TABLET BY MOUTH  TWICE DAILY 180 tablet 3 08/05/2022 at 0500   folic acid (FOLVITE) 1 MG tablet Take 1 mg by mouth daily.   08/04/2022   gabapentin (NEURONTIN) 300 MG capsule TAKE 1 TO 2 CAPSULES BY  MOUTH AT BEDTIME (Patient taking differently: TAKE 1 TO 2 CAPSULES BY  MOUTH AT BEDTIME) 180 capsule 3 08/04/2022   glipiZIDE (GLUCOTROL XL) 2.5 MG 24 hr tablet Take 1 tablet (2.5 mg total) by mouth daily with breakfast. 30 tablet 0 08/04/2022   glucose blood (ONETOUCH ULTRA) test strip Used to check blood glucose one time per day. 100 each 12 08/04/2022   isosorbide mononitrate (IMDUR) 30 MG 24 hr tablet TAKE ONE-HALF TABLET BY MOUTH  DAILY 45 tablet 3 08/05/2022 at 0500   losartan (COZAAR) 25 MG tablet TAKE ONE-HALF TABLET BY MOUTH  DAILY 45 tablet 3 08/05/2022 at 0500   melatonin 5 MG TABS Take 5 mg by mouth at bedtime as needed.   08/04/2022   methotrexate (RHEUMATREX) 2.5 MG tablet Take 2.5 mg by mouth once a week. Pt states  he takes 8 (2.5mg ) tablets weekly   08/04/2022   nitroGLYCERIN (NITROSTAT) 0.4 MG SL tablet Place 1 tablet (0.4 mg total) under the tongue every 5 (five) minutes as needed. MAXIMUM OF 3 DOSES. 25 tablet 6 Past Month   pantoprazole (PROTONIX) 40 MG tablet TAKE 1 TABLET BY MOUTH TWICE  DAILY 180 tablet 3 Past Week   rosuvastatin (CRESTOR) 20 MG tablet Take 1 tablet (20 mg total) by mouth daily. 90 tablet 3 Past Week   SYRINGE-NEEDLE, DISP, 3 ML 25G X 5/8" 3 ML MISC Use as instructed with B12 injection. 50 each 11 08/04/2022   vitamin E 400 UNIT capsule Take 400 Units by mouth in the morning.   08/04/2022     Allergies  Allergen Reactions   Ephedrine Other (See Comments)    Causing prostate to hurt. Using for sinus bronchodilators Pain in groin, prostate   Nsaids Other (See Comments)    GI upset, history of esophagitis and Mallory-Weis tear   Other Rash, Other (See Comments) and Nausea Only    Paper tape causes rash Plastic tape is okay GI upset, history of esophagitis and Mallory-Weis tear   Vancomycin Itching and Rash    Same as pcn with itching   Amoxicillin Itching and Rash   Hyoscyamine Sulfate Other (See Comments)    Urinary retention Urine retention   Levsin [Hyoscyamine Sulfate]  Other (See Comments)    Urinary retention   Naproxen Other (See Comments)    GI upset Also happens with other NSAIDS   Penicillins Itching and Rash    TOLERATED CEFAZOLIN Has patient had a PCN reaction causing immediate rash, facial/tongue/throat swelling, SOB or lightheadedness with hypotension: yes Has patient had a PCN reaction causing severe rash involving mucus membranes or skin necrosis: no Has patient had a PCN reaction that required hospitalization: no Has patient had a PCN reaction occurring within the last 10 years: no If all of the above answers are "NO", then may proceed with Cephalosporin use.    Shellfish Allergy Itching, Nausea Only and Rash    Ingested shellfish cCuses rash and  itching along with stomach sickness. Pt tolerates betadine    Sudafed [Pseudoephedrine Hcl] Other (See Comments)    Constricts prostate flow (from ephedrine d/t sinus meds)     Past Medical History:  Diagnosis Date   Allergic state    Anemia    Aortic atherosclerosis (HCC)    Arthritis    spine, hands, shoulder with previous cuff tear   B12 deficiency    CAD (coronary artery disease)    a.) 11/2015 CT: Coronary Ca2+ noted. b.) LHC 06/14/2016: 60% pRCA, 60% RPLB-2, 99% OM1, 50% mLAD, 40% oLM-LM, 30% pLAD, 60% oD1-D1, 60% oLCx, 95% dRCA; PCI placing a 2.5 x 15mm Resolute Onyx DES dRCA. c.) LHC 09/14/2018: 40% oLM-LM, 50% mLAD, 30% pLAD, 60% oD1-D1, 40% oLCx, 100% OM1, 60% p-mRCA, 50% RPAV; dRCA stent widely patent; medical mgmt.   Candidiasis of esophagus (HCC)    Carotid artery stenosis 04/17/2020   a.) Carotid doppler 04/17/2020: 40-59% RICA/LICA stenosis.   Colon polyp    COVID-19    09/2021   DDD (degenerative disc disease), lumbosacral    Diastolic dysfunction 04/06/2020   a.)  TTE 04/06/2020; EF 55-60%; mild LA dilation; no RWMAs; G2DD.   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    Physical Exam  Gen: Alert, oriented. Appears stated age.  HEENT: PERRLA. Lungs: No respiratory distress CV: RRR Abd: soft, benign, no masses Ext: No edema    Planned procedures: Proceed with EGD/colonoscopy. The patient understands the nature of the planned procedure, indications, risks, alternatives and potential complications including but not limited to bleeding, infection, perforation, damage to internal organs and possible oversedation/side effects from anesthesia. The patient agrees and gives consent to proceed.  Please refer to procedure  notes for findings, recommendations and patient disposition/instructions.     Shane Bill, MD [facility]

## 2022-08-05 NOTE — Anesthesia Postprocedure Evaluation (Signed)
Anesthesia Post Note  Patient: Shane Mcknight  Procedure(s) Performed: ESOPHAGOGASTRODUODENOSCOPY (EGD) WITH PROPOFOL COLONOSCOPY WITH PROPOFOL Balloon dilation wire-guided BIOPSY POLYPECTOMY  Patient location during evaluation: Endoscopy Anesthesia Type: General Level of consciousness: awake and alert Pain management: pain level controlled Vital Signs Assessment: post-procedure vital signs reviewed and stable Respiratory status: spontaneous breathing, nonlabored ventilation, respiratory function stable and patient connected to nasal cannula oxygen Cardiovascular status: blood pressure returned to baseline and stable Postop Assessment: no apparent nausea or vomiting Anesthetic complications: no   No notable events documented.   Last Vitals:  Vitals:   08/05/22 1035 08/05/22 1055  BP: 92/64 137/71  Pulse:    Resp:    Temp:    SpO2:      Last Pain:  Vitals:   08/05/22 1055  TempSrc:   PainSc: 0-No pain                 Cleda Mccreedy Tyjay Galindo

## 2022-08-06 ENCOUNTER — Encounter: Payer: Self-pay | Admitting: Gastroenterology

## 2022-08-06 ENCOUNTER — Ambulatory Visit: Payer: Medicare Other | Admitting: Urology

## 2022-10-15 ENCOUNTER — Other Ambulatory Visit: Payer: Self-pay | Admitting: Internal Medicine

## 2022-10-28 ENCOUNTER — Other Ambulatory Visit: Payer: Self-pay | Admitting: Internal Medicine

## 2022-10-28 ENCOUNTER — Other Ambulatory Visit: Payer: Self-pay | Admitting: Cardiovascular Disease

## 2022-11-22 ENCOUNTER — Other Ambulatory Visit: Payer: Self-pay | Admitting: Pharmacist

## 2022-11-22 ENCOUNTER — Telehealth: Payer: Self-pay | Admitting: Internal Medicine

## 2022-11-22 DIAGNOSIS — E0822 Diabetes mellitus due to underlying condition with diabetic chronic kidney disease: Secondary | ICD-10-CM

## 2022-11-22 DIAGNOSIS — I10 Essential (primary) hypertension: Secondary | ICD-10-CM

## 2022-11-22 DIAGNOSIS — E785 Hyperlipidemia, unspecified: Secondary | ICD-10-CM

## 2022-11-22 DIAGNOSIS — D649 Anemia, unspecified: Secondary | ICD-10-CM

## 2022-11-22 MED ORDER — ROSUVASTATIN CALCIUM 20 MG PO TABS
20.0000 mg | ORAL_TABLET | Freq: Every day | ORAL | 1 refills | Status: DC
Start: 2022-11-22 — End: 2023-07-22

## 2022-11-22 NOTE — Progress Notes (Unsigned)
Pharmacy Quality Measure Review  This patient is appearing on report for being at risk of failing the measure for Statin Use in Persons with Diabetes (SUPD) medications this calendar year.   Rosuvastatin on current med list, though no fill history record for 2024. Patient's preferred pharmacy is [facility], most recent rosuvastatin rx in Epic shows [facility].   Called Mr. Ty who confirms he has been taking his old 10 mg pills (two 10 mg tablets to total 20 mg daily) as his previous prescription expired.   Requests refill of medication to [facility].   Loree Fee, PharmD Clinical Pharmacist [facility] [phone]

## 2022-11-22 NOTE — Telephone Encounter (Signed)
Needs a physical scheduled.  Also needs fasting lab scheduled. Thanks.

## 2022-11-26 NOTE — Telephone Encounter (Signed)
Ok to schedule.

## 2022-11-27 NOTE — Telephone Encounter (Signed)
Appts have been fixed.=

## 2022-12-03 ENCOUNTER — Other Ambulatory Visit (INDEPENDENT_AMBULATORY_CARE_PROVIDER_SITE_OTHER): Payer: Medicare Other

## 2022-12-03 DIAGNOSIS — I10 Essential (primary) hypertension: Secondary | ICD-10-CM | POA: Diagnosis not present

## 2022-12-03 DIAGNOSIS — E0822 Diabetes mellitus due to underlying condition with diabetic chronic kidney disease: Secondary | ICD-10-CM | POA: Diagnosis not present

## 2022-12-03 DIAGNOSIS — D649 Anemia, unspecified: Secondary | ICD-10-CM | POA: Diagnosis not present

## 2022-12-03 DIAGNOSIS — E785 Hyperlipidemia, unspecified: Secondary | ICD-10-CM | POA: Diagnosis not present

## 2022-12-03 DIAGNOSIS — N183 Chronic kidney disease, stage 3 unspecified: Secondary | ICD-10-CM | POA: Diagnosis not present

## 2022-12-03 LAB — BASIC METABOLIC PANEL
BUN: 16 mg/dL (ref 6–23)
CO2: 27 meq/L (ref 19–32)
Calcium: 9.7 mg/dL (ref 8.4–10.5)
Chloride: 101 meq/L (ref 96–112)
Creatinine, Ser: 0.8 mg/dL (ref 0.40–1.50)
GFR: 87.39 mL/min (ref >60.00)
Glucose, Bld: 300 mg/dL — ABNORMAL HIGH (ref 70–99)
Potassium: 3.8 meq/L (ref 3.5–5.1)
Sodium: 137 meq/L (ref 135–145)

## 2022-12-03 LAB — LIPID PANEL
Cholesterol: 126 mg/dL (ref 0–200)
HDL: 38.9 mg/dL — ABNORMAL LOW (ref >39.00)
LDL Cholesterol: 62 mg/dL (ref 0–99)
NonHDL: 87.15
Total CHOL/HDL Ratio: 3
Triglycerides: 124 mg/dL (ref 0.0–149.0)
VLDL: 24.8 mg/dL (ref 0.0–40.0)

## 2022-12-03 LAB — CBC WITH DIFFERENTIAL/PLATELET
Basophils Absolute: 0 10*3/uL (ref 0.0–0.1)
Basophils Relative: 0.5 % (ref 0.0–3.0)
Eosinophils Absolute: 0.2 10*3/uL (ref 0.0–0.7)
Eosinophils Relative: 3.6 % (ref 0.0–5.0)
HCT: 42.5 % (ref 39.0–52.0)
Hemoglobin: 14.6 g/dL (ref 13.0–17.0)
Lymphocytes Relative: 18.1 % (ref 12.0–46.0)
Lymphs Abs: 1 10*3/uL (ref 0.7–4.0)
MCHC: 34.5 g/dL (ref 30.0–36.0)
MCV: 91.9 fL (ref 78.0–100.0)
Monocytes Absolute: 0.6 10*3/uL (ref 0.1–1.0)
Monocytes Relative: 10.1 % (ref 3.0–12.0)
Neutro Abs: 3.8 10*3/uL (ref 1.4–7.7)
Neutrophils Relative %: 67.7 % (ref 43.0–77.0)
Platelets: 143 10*3/uL — ABNORMAL LOW (ref 150.0–400.0)
RBC: 4.62 Mil/uL (ref 4.22–5.81)
RDW: 14.2 % (ref 11.5–15.5)
WBC: 5.6 10*3/uL (ref 4.0–10.5)

## 2022-12-03 LAB — HEMOGLOBIN A1C: Hgb A1c MFr Bld: 9.7 % — ABNORMAL HIGH (ref 4.6–6.5)

## 2022-12-03 LAB — HEPATIC FUNCTION PANEL
ALT: 12 U/L (ref 0–53)
AST: 14 U/L (ref 0–37)
Albumin: 4.5 g/dL (ref 3.5–5.2)
Alkaline Phosphatase: 55 U/L (ref 39–117)
Bilirubin, Direct: 0.2 mg/dL (ref 0.0–0.3)
Total Bilirubin: 0.8 mg/dL (ref 0.2–1.2)
Total Protein: 7.2 g/dL (ref 6.0–8.3)

## 2022-12-03 LAB — MICROALBUMIN / CREATININE URINE RATIO
Creatinine,U: 106.6 mg/dL
Microalb Creat Ratio: 50.3 mg/g — ABNORMAL HIGH (ref 0.0–30.0)
Microalb, Ur: 53.7 mg/dL — ABNORMAL HIGH (ref 0.0–1.9)

## 2022-12-03 LAB — FERRITIN: Ferritin: 29.5 ng/mL (ref 22.0–322.0)

## 2022-12-03 LAB — TSH: TSH: 1.21 u[IU]/mL (ref 0.35–5.50)

## 2022-12-09 ENCOUNTER — Ambulatory Visit: Payer: Medicare Other | Admitting: Internal Medicine

## 2022-12-09 ENCOUNTER — Encounter: Payer: Self-pay | Admitting: Internal Medicine

## 2022-12-09 VITALS — BP 110/72 | HR 72 | Temp 98.0°F | Resp 16 | Ht 68.5 in | Wt 165.0 lb

## 2022-12-09 DIAGNOSIS — L4050 Arthropathic psoriasis, unspecified: Secondary | ICD-10-CM

## 2022-12-09 DIAGNOSIS — K219 Gastro-esophageal reflux disease without esophagitis: Secondary | ICD-10-CM | POA: Diagnosis not present

## 2022-12-09 DIAGNOSIS — D508 Other iron deficiency anemias: Secondary | ICD-10-CM

## 2022-12-09 DIAGNOSIS — Z0000 Encounter for general adult medical examination without abnormal findings: Secondary | ICD-10-CM

## 2022-12-09 DIAGNOSIS — Z0001 Encounter for general adult medical examination with abnormal findings: Secondary | ICD-10-CM

## 2022-12-09 DIAGNOSIS — N183 Chronic kidney disease, stage 3 unspecified: Secondary | ICD-10-CM

## 2022-12-09 DIAGNOSIS — I10 Essential (primary) hypertension: Secondary | ICD-10-CM | POA: Diagnosis not present

## 2022-12-09 DIAGNOSIS — E785 Hyperlipidemia, unspecified: Secondary | ICD-10-CM | POA: Diagnosis not present

## 2022-12-09 DIAGNOSIS — I5022 Chronic systolic (congestive) heart failure: Secondary | ICD-10-CM

## 2022-12-09 DIAGNOSIS — Z125 Encounter for screening for malignant neoplasm of prostate: Secondary | ICD-10-CM

## 2022-12-09 DIAGNOSIS — I25119 Atherosclerotic heart disease of native coronary artery with unspecified angina pectoris: Secondary | ICD-10-CM

## 2022-12-09 DIAGNOSIS — J0110 Acute frontal sinusitis, unspecified: Secondary | ICD-10-CM | POA: Diagnosis not present

## 2022-12-09 DIAGNOSIS — R1310 Dysphagia, unspecified: Secondary | ICD-10-CM

## 2022-12-09 DIAGNOSIS — R350 Frequency of micturition: Secondary | ICD-10-CM

## 2022-12-09 DIAGNOSIS — K222 Esophageal obstruction: Secondary | ICD-10-CM

## 2022-12-09 DIAGNOSIS — I779 Disorder of arteries and arterioles, unspecified: Secondary | ICD-10-CM

## 2022-12-09 DIAGNOSIS — Z7984 Long term (current) use of oral hypoglycemic drugs: Secondary | ICD-10-CM

## 2022-12-09 DIAGNOSIS — D696 Thrombocytopenia, unspecified: Secondary | ICD-10-CM

## 2022-12-09 DIAGNOSIS — E0822 Diabetes mellitus due to underlying condition with diabetic chronic kidney disease: Secondary | ICD-10-CM

## 2022-12-09 LAB — HM DIABETES FOOT EXAM

## 2022-12-09 MED ORDER — DOXYCYCLINE HYCLATE 100 MG PO TABS: 100.0000 mg | ORAL_TABLET | Freq: Two times a day (BID) | ORAL | 0 refills | Status: DC

## 2022-12-09 MED ORDER — GLIPIZIDE 5 MG PO TABS: 5.0000 mg | ORAL_TABLET | Freq: Two times a day (BID) | ORAL | 2 refills | Status: DC

## 2022-12-09 NOTE — Assessment & Plan Note (Signed)
Symptoms appear to be c/w sinus infection/URI.  Treat with doxycycline.  Saline nasal spry.  Robitussin DM as directed.  Follow.

## 2022-12-09 NOTE — Assessment & Plan Note (Signed)
Followed by rheumatology.  Consentyx/MTX/gabapentin.  Stable.

## 2022-12-09 NOTE — Patient Instructions (Signed)
Saline nasal spray - flush nose 1-2x/day  Robitussin DM for cough and congestion.

## 2022-12-09 NOTE — Assessment & Plan Note (Signed)
Continue crestor.  Low cholesterol diet and exercise.  Follow lipid panel and liver function tests.  

## 2022-12-09 NOTE — Assessment & Plan Note (Signed)
EGD (07/2022) with stricture - dilated to 13.5.  recommended repeat EGD in one month for repeat dilation.

## 2022-12-09 NOTE — Assessment & Plan Note (Signed)
09/14/2018 cardiac cath showed patent distal RCA stent with no significant restenosis. Stable moderate disease in the mid LAD and proximal right coronary artery. OM1 now completely occluded. It was 99% stenosed before but overall a small branch. Per note, the only difference noted from most recent angiography was progression of distal RCA disease just proximal to the stent and a tortuous segment causing about 60% stenosis. It was also noted that he had normal LV systolic function with mildly elevated left ventricular end-diastolic pressure. recent chest pain. On imdur.  No further chest pain as outlined.  Tolerating 30mg  imdur.  Continue statin and risk factor modfication.

## 2022-12-09 NOTE — Assessment & Plan Note (Signed)
Low carb diet and exercise.  Off metformin - had itching. Stopped.   Avoid GLP-1 agonist given GI issues and swallowing problems.  Had started him on jardiance.  Having issues with urinary urgency and frequency.  Off jardiance.  Only on glipizide XL 2.5mg  q day.  Discussed treatment options.  Discussed low carb diet and exercise.  Given above, discussed options - increasing glipizide or insulin.  Will hold on actos.  He wants to increase glipizide.  Wants to hold on insulin.  Will increase glipizide to 5mg  bid.  Discussed the need to check sugars - to help with control and treatment. Follow sugars.  Follow met b and a1c. Refer to endocrinology for evaluation and treatment.  Discussed CGM.  He declines at this time.

## 2022-12-09 NOTE — Assessment & Plan Note (Signed)
04/17/2020 carotid showed 40 to 59% narrowing of right and left carotid arteries.  Left carotid artery was unchanged; however, the right carotid artery has progressed.  Followed by cardiology.  Continue statin medication and risk factor modification.

## 2022-12-09 NOTE — Assessment & Plan Note (Signed)
Continue protonix  

## 2022-12-09 NOTE — Assessment & Plan Note (Signed)
S/p EGD and dilatation.  Having to watch what he eats.  Not able to eat a lot of meats.  Planning f/u EGD one month.

## 2022-12-09 NOTE — Assessment & Plan Note (Signed)
Seeing Dr Cathie Hoops.  Hgb and iron level improved.

## 2022-12-09 NOTE — Assessment & Plan Note (Signed)
Dr Richardo Hanks (urology) - 12/25/21.  Followed by urology.

## 2022-12-09 NOTE — Assessment & Plan Note (Signed)
No evidence of volume overload on exam.  Continue imdur, beta blocker and losartan.  Stable.

## 2022-12-09 NOTE — Assessment & Plan Note (Signed)
Platelet count - slightly decreased on recent check.  Follow.  Plan recheck cbc

## 2022-12-09 NOTE — Assessment & Plan Note (Signed)
Continue losartan, atenolol and imdur.  Follow pressures.  Follow metabolic panel.

## 2022-12-09 NOTE — Assessment & Plan Note (Addendum)
Physical today 12/09/22 Colonoscopy - 07/2022 - Sigmoid Colon Polyp - TUBULAR ADENOMA (1). - HYPERPLASTIC POLYP (1). - NEGATIVE FOR HIGH-GRADE DYSPLASIA AND MALIGNANCY.  Check psa next labs.

## 2022-12-09 NOTE — Progress Notes (Signed)
Subjective:    Patient ID: Shane Mcknight, male    DOB: 1950/08/07, 72 y.o.   MRN: [id number]  Patient here for  Chief Complaint  Patient presents with   Annual Exam    HPI Here for a physical exam. Seeing GI.  S/p EGD and colonoscopy 07/2022.  Colonoscopy with small TA - no repeat needed.  EGD with stricture - dilated to 13.5.  recommended repeat EGD in one month for repeat dilation. Has been seeing Dr Richardo Hanks - increased urinary frequency and urgency. Feels symptoms worsened since starting jardiance. Unable to take metformin - intolerance.  Having increased GI issues and swallowing issues affecting his eating.  Discussed holding on GLP 1 given his GI issues.  Discussed his A1c increased - 9.7 on recent check.  States he has not been watching his diet.  Discussed low carb diet and exercise.  Discussed medication options, including increasing glipizide.  Not sure that this will control.  Discussed insulin.  He prefers to hold on starting insulin. No chest pain or sob reported.  No abdominal pain.  Bowels stable.  Some loose stool if he eats certain foods. Does report increased nasal congestion and increased drainage.  No sore throat.  Increased cough.  No chest congestion. No sob. No acid reflux. Nasal - productive mucus - colored mucus and some blood tinged at times.    Past Medical History:  Diagnosis Date   Allergic state    Anemia    Aortic atherosclerosis (HCC)    Arthritis    spine, hands, shoulder with previous cuff tear   B12 deficiency    CAD (coronary artery disease)    a.) 11/2015 CT: Coronary Ca2+ noted. b.) LHC 06/14/2016: 60% pRCA, 60% RPLB-2, 99% OM1, 50% mLAD, 40% oLM-LM, 30% pLAD, 60% oD1-D1, 60% oLCx, 95% dRCA; PCI placing a 2.5 x 15mm Resolute Onyx DES dRCA. c.) LHC 09/14/2018: 40% oLM-LM, 50% mLAD, 30% pLAD, 60% oD1-D1, 40% oLCx, 100% OM1, 60% p-mRCA, 50% RPAV; dRCA stent widely patent; medical mgmt.   Candidiasis of esophagus (HCC)    Carotid artery stenosis 04/17/2020    a.) Carotid doppler 04/17/2020: 40-59% RICA/LICA stenosis.   Colon polyp    COVID-19    09/2021   DDD (degenerative disc disease), lumbosacral    Diastolic dysfunction 04/06/2020   a.)  TTE 04/06/2020; EF 55-60%; mild LA dilation; no RWMAs; G2DD.   Dysphagia    Effort angina (HCC)    Elevated PSA    Elevated transaminase level    Esophageal stricture    a.) s/p multiple dilations   Esophagitis, [place] grade D    Fatty liver    Ganglion cyst of finger of right hand    GERD (gastroesophageal reflux disease)    Guaiac positive stools    Hiatal hernia    HNP (herniated nucleus pulposus), lumbar    Hypercholesterolemia    Hypertension    Long term current use of immunosuppressive drug    a.) MTX + secukinumab for severe psoriatic arthritis   Lumbar radiculitis    Mallory-Weiss tear 11/2015   Migraine aura without headache    OSA (obstructive sleep apnea)    a.) does not use nocturnal PAP therapy   Psoriasis    Psoriatic arthritis (HCC)    a.) severe; Tx'd with MTX + secukinumab   PVC (premature ventricular contraction)    Schatzki's ring    Type II diabetes mellitus (HCC)    Past Surgical History:  Procedure Laterality Date  ANTERIOR CERVICAL DECOMP/DISCECTOMY FUSION N/A 04/13/2021   Procedure: C3-5 ANTERIOR CERVICAL DISCECTOMY AND FUSION (GLOBUS ALLOGRAFT);  Surgeon: Venetia Night, MD;  Location: [facility] ORS;  Service: Neurosurgery;  Laterality: N/A;   ARTHROPLASTY     right thumb; left thumb with 2 screws and plastic joint   BALLOON DILATION N/A 05/29/2015   Procedure: BALLOON DILATION;  Surgeon: Christena Deem, MD;  Location: [facility] ENDOSCOPY;  Service: Endoscopy;  Laterality: N/A;   BALLOON DILATION N/A 02/14/2017   Procedure: BALLOON DILATION;  Surgeon: Christena Deem, MD;  Location: [facility] END[facility]COPY;  Service: Endoscopy;  Laterality: N/A;   BILATERAL CARPAL TUNNEL RELEASE Bilateral 07/04/2017   Procedure: BILATERAL CARPAL TUNNEL RELEASE;  Surgeon: Erin Sons, MD;  Location: [facility] ORS;  Service: Orthopedics;  Laterality: Bilateral;   BIOPSY  08/05/2022   Procedure: BIOPSY;  Surgeon: [name], MD;  Location: [facility] ENDOSCOPY;  Service: Endoscopy;;   BLEPHAROPLASTY     BUNIONECTOMY Left 12/2020   CARDIAC CATHETERIZATION     1 stent    CARPAL TUNNEL RELEASE  2005   right   CHOLECYSTECTOMY  2008   COLONOSCOPY     COLONOSCOPY WITH PROPOFOL N/A 02/13/2018   Procedure: COLONOSCOPY WITH PROPOFOL;  Surgeon: Christena Deem, MD;  Location: [facility] ENDOSCOPY;  Service: Endoscopy;  Laterality: N/A;   COLONOSCOPY WITH PROPOFOL N/A 08/05/2022   Procedure: COLONOSCOPY WITH PROPOFOL;  Surgeon: [name], MD;  Location: [facility] ENDOSCOPY;  Service: Endoscopy;  Laterality: N/A;   CORONARY STENT INTERVENTION N/A 06/14/2016   Procedure: Coronary Stent Intervention;  Surgeon: Iran Ouch, MD;  Location: [facility] INVASIVE CV LAB;  Service: Cardiovascular;  Laterality: N/A;   CYST EXCISION  04/14/2015   tendon sheath cyst excision; right ring finger cyst removed and trigger finger realease   ESOPHAGOGASTRODUODENOSCOPY     ESOPHAGOGASTRODUODENOSCOPY (EGD) WITH PROPOFOL N/A 05/29/2015   Procedure: ESOPHAGOGASTRODUODENOSCOPY (EGD) WITH PROPOFOL;  Surgeon: Christena Deem, MD;  Location: [facility] ENDOSCOPY;  Service: Endoscopy;  Laterality: N/A;   ESOPHAGOGASTRODUODENOSCOPY (EGD) WITH PROPOFOL N/A 07/04/2015   Procedure: ESOPHAGOGASTRODUODENOSCOPY (EGD) WITH PROPOFOL;  Surgeon: Christena Deem, MD;  Location: [facility] ENDOSCOPY;  Service: Endoscopy;  Laterality: N/A;   ESOPHAGOGASTRODUODENOSCOPY (EGD) WITH PROPOFOL N/A 01/04/2016   Procedure: ESOPHAGOGASTRODUODENOSCOPY (EGD) WITH PROPOFOL;  Surgeon: Christena Deem, MD;  Location: [facility] ENDOSCOPY;  Service: Endoscopy;  Laterality: N/A;   ESOPHAGOGASTRODUODENOSCOPY (EGD) WITH PROPOFOL N/A 03/22/2016   Procedure: ESOPHAGOGASTRODUODENOSCOPY (EGD) WITH PROPOFOL;  Surgeon: Christena Deem, MD;  Location:  [facility] ENDOSCOPY;  Service: Endoscopy;  Laterality: N/A;   ESOPHAGOGASTRODUODENOSCOPY (EGD) WITH PROPOFOL N/A 02/14/2017   Procedure: ESOPHAGOGASTRODUODENOSCOPY (EGD) WITH PROPOFOL;  Surgeon: Christena Deem, MD;  Location: [facility] ENDOSCOPY;  Service: Endoscopy;  Laterality: N/A;   ESOPHAGOGASTRODUODENOSCOPY (EGD) WITH PROPOFOL N/A 04/11/2017   Procedure: ESOPHAGOGASTRODUODENOSCOPY (EGD) WITH PROPOFOL;  Surgeon: Christena Deem, MD;  Location: [facility] ENDOSCOPY;  Service: Endoscopy;  Laterality: N/A;   ESOPHAGOGASTRODUODENOSCOPY (EGD) WITH PROPOFOL N/A 07/07/2017   Procedure: ESOPHAGOGASTRODUODENOSCOPY (EGD) WITH PROPOFOL;  Surgeon: Midge Minium, MD;  Location: [facility] ENDOSCOPY;  Service: Endoscopy;  Laterality: N/A;   ESOPHAGOGASTRODUODENOSCOPY (EGD) WITH PROPOFOL N/A 02/13/2018   Procedure: ESOPHAGOGASTRODUODENOSCOPY (EGD) WITH PROPOFOL;  Surgeon: Christena Deem, MD;  Location: [facility] ENDOSCOPY;  Service: Endoscopy;  Laterality: N/A;   ESOPHAGOGASTRODUODENOSCOPY (EGD) WITH PROPOFOL N/A 08/05/2022   Procedure: ESOPHAGOGASTRODUODENOSCOPY (EGD) WITH PROPOFOL;  Surgeon: [name], MD;  Location: [facility] ENDOSCOPY;  Service: Endoscopy;  Laterality: N/A;   FOREIGN BODY REMOVAL N/A 12/03/2015  Procedure: FOREIGN BODY REMOVAL;  Surgeon: Charlott Rakes, MD;  Location: [facility] ENDOSCOPY;  Service: Endoscopy;  Laterality: N/A;   HERNIA REPAIR Right 1970   inguinal   INGUINAL HERNIA REPAIR Left 01/01/2018   Procedure: OPEN HERNIA REPAIR INGUINAL ADULT;  Surgeon: Henrene Dodge, MD;  Location: [facility] ORS;  Service: General;  Laterality: Left;   LEFT HEART CATH Left 06/14/2016   Procedure: Left Heart Cath;  Surgeon: Iran Ouch, MD;  Location: [facility] INVASIVE CV LAB;  Service: Cardiovascular;  Laterality: Left;   LEFT HEART CATH AND CORONARY ANGIOGRAPHY Left 09/14/2018   Procedure: LEFT HEART CATH AND CORONARY ANGIOGRAPHY;  Surgeon: Iran Ouch, MD;  Location: [facility] INVASIVE CV LAB;  Service:  Cardiovascular;  Laterality: Left;   LUMBAR DISC SURGERY  2004   injections; herniated disc; percutaneous discectomy   NASAL SINUS SURGERY  2008   POLYPECTOMY  08/05/2022   Procedure: POLYPECTOMY;  Surgeon: [name], MD;  Location: [facility] ENDOSCOPY;  Service: Endoscopy;;   ROTATOR CUFF REPAIR Right    TONSILLECTOMY     Family History  Problem Relation Age of Onset   Lung cancer Father    Cancer Father    Hypertension Mother    Aneurysm Son    Social History   Socioeconomic History   Marital status: Married    Spouse name: Britta Mccreedy   Number of children: Not on file   Years of education: Not on file   Highest education level: Not on file  Occupational History    Comment: not heavy work  Tobacco Use   Smoking status: Former    Current packs/day: 0.00    Average packs/day: 0.3 packs/day for 7.0 years (1.8 ttl pk-yrs)    Types: Cigarettes    Start date: 07/06/1975    Quit date: 07/06/1982    Years since quitting: 40.4    Passive exposure: Past   Smokeless tobacco: Never  Vaping Use   Vaping status: Never Used  Substance and Sexual Activity   Alcohol use: Not Currently   Drug use: No   Sexual activity: Not on file  Other Topics Concern   Not on file  Social History Narrative   Lives locally with wife.  Active but does not routinely exercise.   Social Determinants of Health   Financial Resource Strain: Low Risk  (03/04/2022)   Overall Financial Resource Strain (CARDIA)    Difficulty of Paying Living Expenses: Not hard at all  Food Insecurity: No Food Insecurity (03/04/2022)   Hunger Vital Sign    Worried About Running Out of Food in the Last Year: Never true    Ran Out of Food in the Last Year: Never true  Transportation Needs: No Transportation Needs (03/04/2022)   PRAPARE - Administrator, Civil Service (Medical): No    Lack of Transportation (Non-Medical): No  Physical Activity: Unknown (03/04/2022)   Exercise Vital Sign    Days of Exercise per  Week: 0 days    Minutes of Exercise per Session: Not on file  Stress: No Stress Concern Present (03/04/2022)   [employer] of Occupational Health - Occupational Stress Questionnaire    Feeling of Stress : Not at all  Social Connections: Unknown (03/04/2022)   Social Connection and Isolation Panel [NHANES]    Frequency of Communication with Friends and Family: Not on file    Frequency of Social Gatherings with Friends and Family: Not on file    Attends Religious Services: Not on file  Active Member of Clubs or Organizations: Not on file    Attends Banker Meetings: Not on file    Marital Status: Married     Review of Systems  Constitutional:  Negative for appetite change and unexpected weight change.  HENT:  Positive for congestion and postnasal drip. Negative for sinus pressure and sore throat.   Eyes:  Negative for pain and visual disturbance.  Respiratory:  Positive for cough. Negative for chest tightness and shortness of breath.   Cardiovascular:  Negative for chest pain and palpitations.  Gastrointestinal:  Negative for abdominal pain, diarrhea, nausea and vomiting.  Genitourinary:  Negative for difficulty urinating and dysuria.  Musculoskeletal:  Negative for joint swelling and myalgias.  Skin:  Negative for color change and rash.  Neurological:  Negative for dizziness and headaches.  Hematological:  Negative for adenopathy. Does not bruise/bleed easily.  Psychiatric/Behavioral:  Negative for agitation and dysphoric mood.        Objective:     BP 110/72   Pulse 72   Temp 98 F (36.7 C)   Resp 16   Ht 5' 8.5" (1.74 m)   Wt 165 lb (74.8 kg)   SpO2 98%   BMI 24.72 kg/m  Wt Readings from Last 3 Encounters:  12/09/22 165 lb (74.8 kg)  08/05/22 170 lb 9.6 oz (77.4 kg)  05/29/22 181 lb 14.4 oz (82.5 kg)    Physical Exam Vitals reviewed.  Constitutional:      General: He is not in acute distress.    Appearance: Normal appearance. He is  well-developed.  HENT:     Head: Normocephalic and atraumatic.     Right Ear: External ear normal.     Left Ear: External ear normal.  Eyes:     General: No scleral icterus.       Right eye: No discharge.        Left eye: No discharge.     Conjunctiva/sclera: Conjunctivae normal.  Cardiovascular:     Rate and Rhythm: Normal rate and regular rhythm.  Pulmonary:     Effort: Pulmonary effort is normal. No respiratory distress.     Breath sounds: Normal breath sounds.  Abdominal:     General: Bowel sounds are normal.     Palpations: Abdomen is soft.     Tenderness: There is no abdominal tenderness.  Musculoskeletal:        General: No swelling or tenderness.     Cervical back: Neck supple. No tenderness.  Lymphadenopathy:     Cervical: No cervical adenopathy.  Skin:    Findings: No erythema or rash.  Neurological:     Mental Status: He is alert.  Psychiatric:        Mood and Affect: Mood normal.        Behavior: Behavior normal.      Outpatient Encounter Medications as of 12/09/2022  Medication Sig   doxycycline (VIBRA-TABS) 100 MG tablet Take 1 tablet (100 mg total) by mouth 2 (two) times daily.   glipiZIDE (GLUCOTROL) 5 MG tablet Take 1 tablet (5 mg total) by mouth 2 (two) times daily before a meal.   acetaminophen (TYLENOL) 500 MG tablet Take 1,000 mg by mouth daily as needed for moderate pain.   aspirin EC 81 MG tablet Take 81 mg by mouth every evening.    atenolol (TENORMIN) 50 MG tablet TAKE 1 TABLET BY MOUTH TWICE  DAILY   folic acid (FOLVITE) 1 MG tablet Take 1 mg by mouth daily.  gabapentin (NEURONTIN) 300 MG capsule TAKE 1 TO 2 CAPSULES BY  MOUTH AT BEDTIME (Patient taking differently: TAKE 1 TO 2 CAPSULES BY  MOUTH AT BEDTIME)   glucose blood (ONETOUCH ULTRA) test strip Used to check blood glucose one time per day.   isosorbide mononitrate (IMDUR) 30 MG 24 hr tablet TAKE ONE-HALF TABLET BY MOUTH  DAILY   losartan (COZAAR) 25 MG tablet TAKE ONE-HALF TABLET BY  MOUTH  DAILY   melatonin 5 MG TABS Take 5 mg by mouth at bedtime as needed.   methotrexate (RHEUMATREX) 2.5 MG tablet Take 2.5 mg by mouth once a week. Pt states he takes 8 (2.5mg ) tablets weekly   nitroGLYCERIN (NITROSTAT) 0.4 MG SL tablet Place 1 tablet (0.4 mg total) under the tongue every 5 (five) minutes as needed. MAXIMUM OF 3 DOSES.   pantoprazole (PROTONIX) 40 MG tablet TAKE 1 TABLET BY MOUTH TWICE  DAILY   rosuvastatin (CRESTOR) 20 MG tablet Take 1 tablet (20 mg total) by mouth daily.   SYRINGE-NEEDLE, DISP, 3 ML 25G X 5/8" 3 ML MISC Use as instructed with B12 injection.   vitamin E 400 UNIT capsule Take 400 Units by mouth in the morning.   [DISCONTINUED] glipiZIDE (GLUCOTROL XL) 2.5 MG 24 hr tablet TAKE 1 TABLET BY MOUTH DAILY  WITH BREAKFAST   No facility-administered encounter medications on file as of 12/09/2022.     Lab Results  Component Value Date   WBC 5.6 12/03/2022   HGB 14.6 12/03/2022   HCT 42.5 12/03/2022   PLT 143.0 (L) 12/03/2022   GLUCOSE 300 (H) 12/03/2022   CHOL 126 12/03/2022   TRIG 124.0 12/03/2022   HDL 38.90 (L) 12/03/2022   LDLCALC 62 12/03/2022   ALT 12 12/03/2022   AST 14 12/03/2022   NA 137 12/03/2022   K 3.8 12/03/2022   CL 101 12/03/2022   CREATININE 0.80 12/03/2022   BUN 16 12/03/2022   CO2 27 12/03/2022   TSH 1.21 12/03/2022   PSA 2.77 11/05/2021   INR 1.1 06/07/2016   HGBA1C 9.7 (H) 12/03/2022   MICROALBUR 53.7 (H) 12/03/2022       Assessment & Plan:  Routine general medical examination at a health care facility  Health care maintenance Assessment & Plan: Physical today 12/09/22 Colonoscopy - 07/2022 - Sigmoid Colon Polyp - TUBULAR ADENOMA (1). - HYPERPLASTIC POLYP (1). - NEGATIVE FOR HIGH-GRADE DYSPLASIA AND MALIGNANCY.  Check psa next labs.    Urinary frequency Assessment & Plan:  Dr Richardo Hanks (urology) - 12/25/21.  Followed by urology.    Stricture and stenosis of esophagus Assessment & Plan: S/p EGD and dilatation.   Having to watch what he eats.  Not able to eat a lot of meats.  Planning f/u EGD one month.    Acute frontal sinusitis, recurrence not specified Assessment & Plan: Symptoms appear to be c/w sinus infection/URI.  Treat with doxycycline.  Saline nasal spry.  Robitussin DM as directed.  Follow.     Psoriatic arthritis (HCC) Assessment & Plan: Followed by rheumatology.  Consentyx/MTX/gabapentin.  Stable.    Other iron deficiency anemia Assessment & Plan: Seeing Dr Cathie Hoops.  Hgb and iron level improved.    Primary hypertension Assessment & Plan: Continue losartan, atenolol and imdur.  Follow pressures.  Follow metabolic panel.    Hyperlipidemia, unspecified hyperlipidemia type Assessment & Plan: Continue crestor.  Low cholesterol diet and exercise.  Follow lipid panel and liver function tests.    Orders: -     Hepatic  function panel; Future -     Lipid panel; Future  Gastroesophageal reflux disease, unspecified whether esophagitis present Assessment & Plan: Continue protonix.     Dysphagia, unspecified type Assessment & Plan:  EGD (07/2022) with stricture - dilated to 13.5.  recommended repeat EGD in one month for repeat dilation.    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  Off metformin - had itching. Stopped.   Avoid GLP-1 agonist given GI issues and swallowing problems.  Had started him on jardiance.  Having issues with urinary urgency and frequency.  Off jardiance.  Only on glipizide XL 2.5mg  q day.  Discussed treatment options.  Discussed low carb diet and exercise.  Given above, discussed options - increasing glipizide or insulin.  Will hold on actos.  He wants to increase glipizide.  Wants to hold on insulin.  Will increase glipizide to 5mg  bid.  Discussed the need to check sugars - to help with control  and treatment. Follow sugars.  Follow met b and a1c. Refer to endocrinology for evaluation and treatment.  Discussed CGM.  He declines at this time.    Orders: -     Ambulatory referral to Endocrinology -     Basic metabolic panel; Future -     Hemoglobin A1c; Future  Coronary artery disease involving native coronary artery of native heart with angina pectoris [facility]) Assessment & Plan: 09/14/2018 cardiac cath showed patent distal RCA stent with no significant restenosis. Stable moderate disease in the mid LAD and proximal right coronary artery.  OM1 now completely occluded.  It was 99% stenosed before but overall a small branch.  Per note, the only difference noted from most recent angiography was progression of distal RCA disease just proximal to the stent and a tortuous segment causing about 60% stenosis.  It was also noted that he had normal LV systolic function with mildly elevated left ventricular end-diastolic pressure.  recent chest pain. On imdur.  No further chest pain as outlined.  Tolerating 30mg  imdur.  Continue statin and risk factor modfication.     Chronic systolic heart failure (HCC) Assessment & Plan: No evidence of volume overload on exam.  Continue imdur, beta blocker and losartan.  Stable.    Bilateral carotid artery disease, unspecified type [facility]) Assessment & Plan: 04/17/2020 carotid showed 40 to 59% narrowing of right and left carotid arteries.  Left carotid artery was unchanged; however, the right carotid artery has progressed.  Followed by cardiology.  Continue statin medication and risk factor modification.    Thrombocytopenia (HCC) Assessment & Plan: Platelet count - slightly decreased on recent check.  Follow.  Plan recheck cbc   Orders: -     CBC with Differential/Platelet; Future  Prostate cancer screening -     PSA, Medicare; Future  Other orders -     Doxycycline Hyclate; Take 1 tablet (100 mg total) by mouth 2 (two) times daily.  Dispense: 14 tablet;  Refill: 0 -     glipiZIDE; Take 1 tablet (5 mg total) by mouth 2 (two) times daily before a meal.  Dispense: 60 tablet; Refill: 2     [place], MD

## 2023-01-03 ENCOUNTER — Other Ambulatory Visit: Payer: Medicare Other

## 2023-01-13 ENCOUNTER — Other Ambulatory Visit: Payer: Self-pay | Admitting: Internal Medicine

## 2023-01-13 DIAGNOSIS — E1159 Type 2 diabetes mellitus with other circulatory complications: Secondary | ICD-10-CM | POA: Diagnosis not present

## 2023-01-13 DIAGNOSIS — E1169 Type 2 diabetes mellitus with other specified complication: Secondary | ICD-10-CM | POA: Diagnosis not present

## 2023-01-13 DIAGNOSIS — I152 Hypertension secondary to endocrine disorders: Secondary | ICD-10-CM | POA: Diagnosis not present

## 2023-01-13 DIAGNOSIS — E785 Hyperlipidemia, unspecified: Secondary | ICD-10-CM | POA: Diagnosis not present

## 2023-01-13 DIAGNOSIS — I5022 Chronic systolic (congestive) heart failure: Secondary | ICD-10-CM

## 2023-01-13 DIAGNOSIS — E1165 Type 2 diabetes mellitus with hyperglycemia: Secondary | ICD-10-CM | POA: Diagnosis not present

## 2023-01-16 DIAGNOSIS — L4050 Arthropathic psoriasis, unspecified: Secondary | ICD-10-CM | POA: Diagnosis not present

## 2023-01-16 DIAGNOSIS — Z79899 Other long term (current) drug therapy: Secondary | ICD-10-CM | POA: Diagnosis not present

## 2023-02-17 HISTORY — PX: OTHER SURGICAL HISTORY: SHX169

## 2023-02-19 ENCOUNTER — Emergency Department: Payer: Medicare Other

## 2023-02-19 ENCOUNTER — Encounter: Payer: Self-pay | Admitting: Intensive Care

## 2023-02-19 ENCOUNTER — Other Ambulatory Visit: Payer: Self-pay

## 2023-02-19 ENCOUNTER — Emergency Department
Admission: EM | Admit: 2023-02-19 | Discharge: 2023-02-19 | Disposition: A | Discharge status: Other Acute Inpatient Hospital | Payer: Medicare Other | Attending: Emergency Medicine | Admitting: Emergency Medicine

## 2023-02-19 DIAGNOSIS — R109 Unspecified abdominal pain: Secondary | ICD-10-CM | POA: Diagnosis present

## 2023-02-19 DIAGNOSIS — K223 Perforation of esophagus: Secondary | ICD-10-CM | POA: Insufficient documentation

## 2023-02-19 DIAGNOSIS — J90 Pleural effusion, not elsewhere classified: Secondary | ICD-10-CM | POA: Diagnosis not present

## 2023-02-19 DIAGNOSIS — K920 Hematemesis: Secondary | ICD-10-CM | POA: Diagnosis not present

## 2023-02-19 DIAGNOSIS — E7800 Pure hypercholesterolemia, unspecified: Secondary | ICD-10-CM | POA: Diagnosis not present

## 2023-02-19 DIAGNOSIS — K449 Diaphragmatic hernia without obstruction or gangrene: Secondary | ICD-10-CM | POA: Diagnosis not present

## 2023-02-19 DIAGNOSIS — I10 Essential (primary) hypertension: Secondary | ICD-10-CM | POA: Insufficient documentation

## 2023-02-19 DIAGNOSIS — Z4682 Encounter for fitting and adjustment of non-vascular catheter: Secondary | ICD-10-CM | POA: Diagnosis not present

## 2023-02-19 DIAGNOSIS — G8918 Other acute postprocedural pain: Secondary | ICD-10-CM | POA: Diagnosis not present

## 2023-02-19 DIAGNOSIS — Z7984 Long term (current) use of oral hypoglycemic drugs: Secondary | ICD-10-CM | POA: Diagnosis not present

## 2023-02-19 DIAGNOSIS — R739 Hyperglycemia, unspecified: Secondary | ICD-10-CM | POA: Diagnosis not present

## 2023-02-19 DIAGNOSIS — J982 Interstitial emphysema: Secondary | ICD-10-CM | POA: Diagnosis not present

## 2023-02-19 DIAGNOSIS — J9601 Acute respiratory failure with hypoxia: Secondary | ICD-10-CM | POA: Diagnosis not present

## 2023-02-19 DIAGNOSIS — R918 Other nonspecific abnormal finding of lung field: Secondary | ICD-10-CM | POA: Diagnosis not present

## 2023-02-19 DIAGNOSIS — E876 Hypokalemia: Secondary | ICD-10-CM | POA: Diagnosis not present

## 2023-02-19 DIAGNOSIS — D62 Acute posthemorrhagic anemia: Secondary | ICD-10-CM | POA: Diagnosis not present

## 2023-02-19 DIAGNOSIS — I2510 Atherosclerotic heart disease of native coronary artery without angina pectoris: Secondary | ICD-10-CM | POA: Diagnosis not present

## 2023-02-19 DIAGNOSIS — E1165 Type 2 diabetes mellitus with hyperglycemia: Secondary | ICD-10-CM | POA: Diagnosis not present

## 2023-02-19 DIAGNOSIS — Z7982 Long term (current) use of aspirin: Secondary | ICD-10-CM | POA: Diagnosis not present

## 2023-02-19 DIAGNOSIS — Z880 Allergy status to penicillin: Secondary | ICD-10-CM | POA: Diagnosis not present

## 2023-02-19 DIAGNOSIS — L4050 Arthropathic psoriasis, unspecified: Secondary | ICD-10-CM | POA: Diagnosis not present

## 2023-02-19 DIAGNOSIS — Z452 Encounter for adjustment and management of vascular access device: Secondary | ICD-10-CM | POA: Diagnosis not present

## 2023-02-19 DIAGNOSIS — E43 Unspecified severe protein-calorie malnutrition: Secondary | ICD-10-CM | POA: Diagnosis not present

## 2023-02-19 DIAGNOSIS — G4733 Obstructive sleep apnea (adult) (pediatric): Secondary | ICD-10-CM | POA: Diagnosis not present

## 2023-02-19 DIAGNOSIS — R931 Abnormal findings on diagnostic imaging of heart and coronary circulation: Secondary | ICD-10-CM | POA: Diagnosis not present

## 2023-02-19 DIAGNOSIS — K222 Esophageal obstruction: Secondary | ICD-10-CM | POA: Diagnosis not present

## 2023-02-19 DIAGNOSIS — Z4659 Encounter for fitting and adjustment of other gastrointestinal appliance and device: Secondary | ICD-10-CM | POA: Diagnosis not present

## 2023-02-19 DIAGNOSIS — K567 Ileus, unspecified: Secondary | ICD-10-CM | POA: Diagnosis not present

## 2023-02-19 DIAGNOSIS — Z6824 Body mass index (BMI) 24.0-24.9, adult: Secondary | ICD-10-CM | POA: Diagnosis not present

## 2023-02-19 DIAGNOSIS — Z79631 Long term (current) use of antimetabolite agent: Secondary | ICD-10-CM | POA: Diagnosis not present

## 2023-02-19 DIAGNOSIS — E119 Type 2 diabetes mellitus without complications: Secondary | ICD-10-CM | POA: Diagnosis not present

## 2023-02-19 DIAGNOSIS — K631 Perforation of intestine (nontraumatic): Secondary | ICD-10-CM | POA: Diagnosis not present

## 2023-02-19 DIAGNOSIS — Z9889 Other specified postprocedural states: Secondary | ICD-10-CM | POA: Diagnosis not present

## 2023-02-19 DIAGNOSIS — Z9911 Dependence on respirator [ventilator] status: Secondary | ICD-10-CM | POA: Diagnosis not present

## 2023-02-19 DIAGNOSIS — E1129 Type 2 diabetes mellitus with other diabetic kidney complication: Secondary | ICD-10-CM | POA: Diagnosis not present

## 2023-02-19 DIAGNOSIS — J9851 Mediastinitis: Secondary | ICD-10-CM | POA: Diagnosis not present

## 2023-02-19 DIAGNOSIS — R079 Chest pain, unspecified: Secondary | ICD-10-CM | POA: Diagnosis not present

## 2023-02-19 DIAGNOSIS — Z886 Allergy status to analgesic agent status: Secondary | ICD-10-CM | POA: Diagnosis not present

## 2023-02-19 DIAGNOSIS — J9811 Atelectasis: Secondary | ICD-10-CM | POA: Diagnosis not present

## 2023-02-19 DIAGNOSIS — Z79899 Other long term (current) drug therapy: Secondary | ICD-10-CM | POA: Diagnosis not present

## 2023-02-19 DIAGNOSIS — K219 Gastro-esophageal reflux disease without esophagitis: Secondary | ICD-10-CM | POA: Diagnosis not present

## 2023-02-19 DIAGNOSIS — K6389 Other specified diseases of intestine: Secondary | ICD-10-CM | POA: Diagnosis not present

## 2023-02-19 DIAGNOSIS — J811 Chronic pulmonary edema: Secondary | ICD-10-CM | POA: Diagnosis not present

## 2023-02-19 DIAGNOSIS — R1110 Vomiting, unspecified: Secondary | ICD-10-CM | POA: Diagnosis not present

## 2023-02-19 DIAGNOSIS — Z789 Other specified health status: Secondary | ICD-10-CM | POA: Diagnosis not present

## 2023-02-19 DIAGNOSIS — E1169 Type 2 diabetes mellitus with other specified complication: Secondary | ICD-10-CM | POA: Diagnosis not present

## 2023-02-19 DIAGNOSIS — Z87891 Personal history of nicotine dependence: Secondary | ICD-10-CM | POA: Diagnosis not present

## 2023-02-19 LAB — COMPREHENSIVE METABOLIC PANEL
ALT: 14 U/L (ref 0–44)
AST: 20 U/L (ref 15–41)
Albumin: 4.8 g/dL (ref 3.5–5.0)
Alkaline Phosphatase: 52 U/L (ref 38–126)
Anion gap: 10 (ref 5–15)
BUN: 11 mg/dL (ref 8–23)
CO2: 27 mmol/L (ref 22–32)
Calcium: 9.1 mg/dL (ref 8.9–10.3)
Chloride: 106 mmol/L (ref 98–111)
Creatinine, Ser: 0.86 mg/dL (ref 0.61–1.24)
GFR, Estimated: >60 mL/min (ref >60)
Glucose, Bld: 187 mg/dL — ABNORMAL HIGH (ref 70–99)
Potassium: 4 mmol/L (ref 3.5–5.1)
Sodium: 143 mmol/L (ref 135–145)
Total Bilirubin: 1.2 mg/dL (ref 0.0–1.2)
Total Protein: 8.3 g/dL — ABNORMAL HIGH (ref 6.5–8.1)

## 2023-02-19 LAB — CBC
HCT: 41.6 % (ref 39.0–52.0)
Hemoglobin: 14.6 g/dL (ref 13.0–17.0)
MCH: 31.5 pg (ref 26.0–34.0)
MCHC: 35.1 g/dL (ref 30.0–36.0)
MCV: 89.8 fL (ref 80.0–100.0)
Platelets: 185 10*3/uL (ref 150–400)
RBC: 4.63 MIL/uL (ref 4.22–5.81)
RDW: 14.7 % (ref 11.5–15.5)
WBC: 7.6 10*3/uL (ref 4.0–10.5)
nRBC: 0 % (ref 0.0–0.2)

## 2023-02-19 LAB — LIPASE, BLOOD: Lipase: 27 U/L (ref 11–51)

## 2023-02-19 MED ORDER — METRONIDAZOLE 500 MG/100ML IV SOLN: 500.0000 mg | Freq: Two times a day (BID) | INTRAVENOUS | Status: DC

## 2023-02-19 MED ORDER — LINEZOLID 600 MG/300ML IV SOLN
600.0000 mg | Freq: Once | INTRAVENOUS | Status: AC
  Administered 2023-02-19: 600 mg via INTRAVENOUS
  Filled 2023-02-19: qty 300

## 2023-02-19 MED ORDER — METRONIDAZOLE 500 MG/100ML IV SOLN
500.0000 mg | Freq: Once | INTRAVENOUS | Status: AC
  Administered 2023-02-19: 500 mg via INTRAVENOUS
  Filled 2023-02-19: qty 100

## 2023-02-19 MED ORDER — MORPHINE SULFATE (PF) 4 MG/ML IV SOLN
4.0000 mg | Freq: Once | INTRAVENOUS | Status: AC
  Administered 2023-02-19: 4 mg via INTRAVENOUS
  Filled 2023-02-19: qty 1

## 2023-02-19 MED ORDER — SODIUM CHLORIDE 0.9 % IV SOLN
1.0000 g | INTRAVENOUS | Status: DC
Start: 2023-02-20 — End: 2023-02-20

## 2023-02-19 MED ORDER — SODIUM CHLORIDE 0.9 % IV BOLUS
1000.0000 mL | Freq: Once | INTRAVENOUS | Status: AC
  Administered 2023-02-19: 1000 mL via INTRAVENOUS

## 2023-02-19 MED ORDER — SODIUM CHLORIDE 0.9 % IV SOLN
2.0000 g | Freq: Once | INTRAVENOUS | Status: AC
  Administered 2023-02-19: 2 g via INTRAVENOUS
  Filled 2023-02-19: qty 10

## 2023-02-19 NOTE — ED Notes (Signed)
Report called to Bertram Gala, Consulting civil engineer at [facility] ED. Awaiting transport of patient at this time.

## 2023-02-19 NOTE — ED Notes (Signed)
 spoke with rep. ruby for possible transfer to [facility] gave her necessary info about pt. transferred to Dr. Vicenta Graft for further consult.

## 2023-02-19 NOTE — ED Notes (Signed)
 Called [facility] Spoke with Rep. Tiff/ power shared all images/ faxed face sheet/ Transferred to Dr. Vicenta Graft for consult.

## 2023-02-19 NOTE — ED Notes (Signed)
 Images powershared to [facility] per ED staff request.

## 2023-02-19 NOTE — ED Triage Notes (Signed)
 Reports choking on ham this AM. Reports he has been having N/V and throwing up blood. History of esophagus tear and reports it has been stretched many times.   History hiatal hernia

## 2023-02-19 NOTE — ED Notes (Signed)
 Pt. Has been accepted ED to ED to [facility] [address] [place]? Accepting Dr. Donnice Hartwig/ report number: [phone] Transfer Coordinator Tiff. Spoke with Cathlyn from [employer] (ground) he stated he put the pt on the list but its low priority because its BLS and will send a truck as soon as he can. I will reach out to [facility] for transportation as well.

## 2023-02-19 NOTE — ED Notes (Signed)
 ..  EMTALA: REQUIRED DOCUMENTATION COMPLETED AND REVIEWED BY WRITER PRIOR TO PT TRANSFER MD REASSESSMENT EMTALA RN SECTION TRANSFER E-SIGN VS WITHIN REQUIRED TIME

## 2023-02-19 NOTE — ED Notes (Signed)
 Per Dr. Vicenta Graft [facility] does not have Thoracic Surgery, Calling [facility]

## 2023-02-19 NOTE — ED Provider Notes (Signed)
 [facility] Provider Note    Event Date/Time   First MD Initiated Contact with Patient 02/19/23 1656     (approximate)   History   Chief Complaint: Abdominal Pain   HPI  Shane Mcknight is a 73 y.o. male with a history of hypertension, recurrent esophageal stricture, GERD who comes to the ED due to chest pain and vomiting.  Patient reports he was in his usual state of health until breakfast today.  While eating, he had sudden onset of discomfort consistent with esophageal food impaction.  He has not been able to tolerate any food or liquids since then.  He has been vomiting throughout the day, and after several episodes of vomiting he started noticing blood in the emesis, and subsequently developed severe central chest pain.  No dizziness or syncope.  No fever.  Patient does note that over the last few months he has been having episodes of feeling that food gets hung up in his esophagus temporarily but the sensation subsides after several minutes.  Reviewed outside records noting patient last had upper endoscopy by Dr. Maryruth on August 05, 2022 during which he had dilation of the lower esophagus.          Physical Exam   Triage Vital Signs: ED Triage Vitals  Encounter Vitals Group     BP 02/19/23 1225 (!) 228/113     Systolic BP Percentile --      Diastolic BP Percentile --      Pulse Rate 02/19/23 1222 76     Resp 02/19/23 1222 18     Temp 02/19/23 1222 98.2 F (36.8 C)     Temp Source 02/19/23 1222 Oral     SpO2 02/19/23 1222 99 %     Weight 02/19/23 1222 172 lb (78 kg)     Height 02/19/23 1222 5' 8.5 (1.74 m)     Head Circumference --      Peak Flow --      Pain Score 02/19/23 1222 1     Pain Loc --      Pain Education --      Exclude from Growth Chart --     Most recent vital signs: Vitals:   02/19/23 1225 02/19/23 1720  BP: (!) 228/113 (!) 164/88  Pulse:  99  Resp:  16  Temp:  98.4 F (36.9 C)  SpO2:  98%    General: Awake,  no distress.  CV:  Good peripheral perfusion.  Regular rate and rhythm, no murmurs or crunch Resp:  Normal effort.  Clear lungs Abd:  No distention.  Soft, nontender Other:  No cervical subcutaneous emphysema   ED Results / Procedures / Treatments   Labs (all labs ordered are listed, but only abnormal results are displayed) Labs Reviewed  COMPREHENSIVE METABOLIC PANEL - Abnormal; Notable for the following components:      Result Value   Glucose, Bld 187 (*)    Total Protein 8.3 (*)    All other components within normal limits  LIPASE, BLOOD  CBC     EKG    RADIOLOGY X-ray chest interpreted by me, unremarkable.  Radiology report reviewed  CT chest shows perforation of the lower esophagus with pneumomediastinum and debris in the mediastinum.   PROCEDURES:  .Critical Care  Performed by: Viviann Pastor, MD Authorized by: Viviann Pastor, MD   Critical care provider statement:    Critical care time (minutes):  35   Critical care time was exclusive of:  Separately billable procedures and treating other patients   Critical care was necessary to treat or prevent imminent or life-threatening deterioration of the following conditions:  Shock   Critical care was time spent personally by me on the following activities:  Development of treatment plan with patient or surrogate, discussions with consultants, evaluation of patient's response to treatment, examination of patient, obtaining history from patient or surrogate, ordering and performing treatments and interventions, ordering and review of laboratory studies, ordering and review of radiographic studies, pulse oximetry, re-evaluation of patient's condition and review of old charts   Care discussed with: accepting provider at another facility      MEDICATIONS ORDERED IN ED: Medications  linezolid  (ZYVOX ) IVPB 600 mg (has no administration in time range)  aztreonam  (AZACTAM ) 2 g in sodium chloride  0.9 % 100 mL IVPB (2 g  Intravenous New Bag/Given 02/19/23 1907)  metroNIDAZOLE  (FLAGYL ) IVPB 500 mg (has no administration in time range)  cefTRIAXone (ROCEPHIN) 1 g in sodium chloride  0.9 % 100 mL IVPB (has no administration in time range)  metroNIDAZOLE  (FLAGYL ) IVPB 500 mg (has no administration in time range)  sodium chloride  0.9 % bolus 1,000 mL (0 mLs Intravenous Stopped 02/19/23 1905)  morphine  (PF) 4 MG/ML injection 4 mg (4 mg Intravenous Given 02/19/23 1729)     IMPRESSION / MDM / ASSESSMENT AND PLAN / ED COURSE  I reviewed the triage vital signs and the nursing notes.  DDx: Esophageal food impaction, esophageal perforation, GERD, pancreatitis, dehydration, AKI  Patient's presentation is most consistent with acute presentation with potential threat to life or bodily function.  Patient presents with vomiting and chest pain after experiencing a suspected esophageal food impaction.  Initial x-ray looks okay, nondiagnostic, will obtain CT chest.   Clinical Course as of 02/19/23 1925  Wed Feb 19, 2023  1850 CT d/w radiology, demonstrating esophageal perforation with debris in the mediastinum. Abx allergies reviewed, flagyl /azactam /zyvox  ordered. Pharmacy consult requested. Pt and spouse informed - will initiate xfer to [facility] thoracic surgery [PS]  1911 D/w [facility] surg who advises he only performs cardiac surgery, no Thoracic surgery coverage. Will contact [facility] [PS]    Clinical Course User Index [PS] Viviann Pastor, MD    ----------------------------------------- 9:04 PM on 02/19/2023 ----------------------------------------- Patient accepted for transfer to [facility] by attending Dr. Donnice Moris.  Awaiting transport.  Remains awake and alert, maintaining airway, no respiratory distress   FINAL CLINICAL IMPRESSION(S) / ED DIAGNOSES   Final diagnoses:  Esophageal perforation     Rx / DC Orders   ED Discharge Orders     None        Note:  This document was prepared using Dragon voice recognition  software and may include unintentional dictation errors.   Viviann Pastor, MD 02/19/23 2104

## 2023-02-19 NOTE — ED Notes (Signed)
 Called [facility] spoke with rep. Ruby she stated she will put the pt. on the list, but its very busy and she can't give an ETA. Rep. Ruby said she will call when a truck becomes available.

## 2023-02-19 NOTE — ED Notes (Signed)
 Called [facility] to follow up and see if they received the images and face sheet Rep. Moira Andrews stated they did , they have paged Thoracic Surgery and waiting on a call back.

## 2023-02-19 NOTE — ED Notes (Addendum)
Consent to transfer signed by wife.

## 2023-02-20 DIAGNOSIS — K223 Perforation of esophagus: Secondary | ICD-10-CM | POA: Diagnosis not present

## 2023-02-20 NOTE — H&P (Signed)
 [facility] Pre-procedure H&P  Demographics:[name] is a 73 y.o. male with PMH significant for  HTN, DM2, OSA, psoriatic arthritis, esophageal stricturing s/p multiple endoscopic dilations, prior Mallory Weiss tear who presents to the [facility] ED as a transfer from an OSH due to spontaneous esophageal perforation.   Patient  had an endoscopic stent placed which bridges the distal GE junction.  Procedure requested: GJ placement Indication: Esophageal perforation Plan for moderate sedation: Yes Patient prior anesthesia or moderate sedation: Yes, with no complications. DNR: No Suspended for procedure: Not applicable NPO: NPO at midnight Venous Access: Peripheral IV Precautions: None  Allergies  Allergen Reactions  . Nsaids (Non-Steroidal Anti-Inflammatory Drug) Other (See Comments)    GI upset, history of esophagitis and Mallory-Weis tear  . Amoxicillin Rash  . Ephedrine  Other (See Comments)    Pain in groin, prostate  . Levsin [Hyoscyamine Sulfate] Other (See Comments)    Urine retention  . Other Rash    Paper tape  . Sudafed [Pseudoephedrine Hcl] Other (See Comments)    Pain in groin, prostate  . Vancocin [Vancomycin] Rash  . Vicodin [Hydrocodone -Acetaminophen ] Other (See Comments)    GI Upset  . Naproxen Other (See Comments)    GI upset GI upset Also happens with other NSAIDS  . Penicillins Unknown, Itching and Rash    TOLERATED CEFAZOLIN  Has patient had a PCN reaction causing immediate rash, facial/tongue/throat swelling, SOB or lightheadedness with hypotension: yes Has patient had a PCN reaction causing severe rash involving mucus membranes or skin necrosis: noHas patient had a PCN reaction that required hospitalization: no Has patient had a PCN reaction occurring within the last 10 years: no If all of the above answers are NO, then may proceed with Cephalosporin use.  SABRA Shellfish Containing Products Nausea, Rash and Itching    Ingested shellfish cCuses rash  and itching along with stomach sickness. Pt tolerates betadine     Patient allergic to iodinated contrast: No  Current Facility-Administered Medications  Medication Dose Route Frequency Provider Last Rate Last Admin  . acetaminophen  (OFIRMEV ) injection 1,000 mg  1,000 mg Intravenous Q6H Fhanfin, Liza Isabelle, NP   1,000 mg at 02/20/23 1048  . bisacodyL  (DULCOLAX) suppository 10 mg  10 mg Rectal Daily Fhanfin, Liza Isabelle, NP      . ceFEPIme (MAXIPIME) 1 g in sodium chloride  0.9 % 100 mL IVPB  1 g Intravenous Q6H Fhanfin, Liza Isabelle, NP      . dextrose  50% in water solution 12.5-25 g  12.5-25 g Intravenous As Directed Dorris Chesley POUR, MD      . fluconazole in NaCl (DIFLUCAN) IVPB 400 mg  400 mg Intravenous Q24H Fhanfin, Liza Isabelle, NP      . glucagon (GLUCAGEN) injection 1 mg  1 mg Intramuscular As Directed Jawitz, Oliver K, MD      . Roni by provider] heparin  (porcine) 5,000 unit/mL injection 5,000 Units  5,000 Units Subcutaneous Q8H [facility] Jawitz, Oliver K, MD   5,000 Units at 02/20/23 0740  . HYDROmorphone  (DILAUDID ) PCA 25 mg/25 mL (1 mg/mL) (opioid-naive)   Intravenous Continuous Fhanfin, Liza Isabelle, NP   PCA New Syringe/Cartridge at 02/20/23 1001  . insulin  REGULAR human in 0.9% sodium chloride  (MYXRDELIN) 100 units/100 mL infusion  0-20 Units/hr Intravenous Continuous Fhanfin, Liza Isabelle, NP   Paused at 02/20/23 1509   Followed by  . [START ON 02/21/2023] insulin  REGULAR human (MYXREDLIN) in sodium chloride  0.9% infusion 100 units/100 mL  0-20 Units/hr Intravenous Continuous Fhanfin, Liza Isabelle,  NP      . ipratropium-albuteroL  (DUO-NEB) nebulizer solution 3 mL  3 mL Nebulization QID PRN Fhanfin, Liza Isabelle, NP      . lactated Ringers infusion   Intravenous Continuous Fhanfin, Liza Isabelle, NP 30 mL/hr at 02/20/23 1500 Rate Verify at 02/20/23 1500  . lidocaine  (SALONPAS) 4 % patch 2 patch  2 patch Transdermal Q24H Fhanfin, Michal Neither, NP   2 patch at 02/20/23 0841  .  linezolid  in dextrose  5% (ZYVOX ) 600 mg/300 mL IVPB 600 mg  600 mg Intravenous Q12H [facility] Jawitz, Oliver K, MD 300 mL/hr at 02/20/23 0911 600 mg at 02/20/23 0911  . metroNIDAZOLE  in NaCl (FLAGYL ) IVPB 500 mg  500 mg Intravenous Q12H Fhanfin, Liza Isabelle, NP 200 mL/hr at 02/20/23 1016 500 mg at 02/20/23 1016  . naloxone (NARCAN) injection 0.4 mg  0.4 mg Intravenous As Directed Fhanfin, Liza Isabelle, NP      . ondansetron  (PF) (ZOFRAN ) injection 4 mg  4 mg Intravenous Q6H PRN Jawitz, Oliver K, MD      . pantoprazole  (PROTONIX ) injection 40 mg  40 mg Intravenous Daily Fhanfin, Liza Isabelle, NP   40 mg at 02/20/23 0909  . sodium chloride  0.9% infusion   Intravenous Continuous PRN Fhanfin, Liza Isabelle, NP         Past Medical History:  Diagnosis Date  . Abnormal liver function    biospy consistent with methotrexate   . Allergic state    recurring allergy and sinus problems  . Arthritis    spine, hands, shoulder with previous cuff tear  . Candidiasis of esophagus (CMS/HHS-HCC) 05/29/2015  . Colon polyp 08/30/13   tubular adenoma  . Diabetes (CMS/HHS-HCC)    Type 2 DM since 09/16/2011  . Diabetes mellitus (CMS/HHS-HCC) 01/30/2012   Last Assessment & Plan:  Low carb diet and exercise.  Follow met b and a1c.  SABRA Elevated transaminase level   . Esophageal stricture   . Esophagitis, [place] grade D 05/29/2015  . GERD (gastroesophageal reflux disease)   . Hiatal hernia   . Hypercholesterolemia   . Hypertension   . Monilial esophagitis (CMS/HHS-HCC) 05/29/2015  . Obstructive sleep apnea 03/16/2012   Last Assessment & Plan:  Avoid sleeping supine and avoid sedating medication.    . Psoriasis    Psoriatic arthritis  . Psoriatic arthritis (CMS/HHS-HCC)    a.S/p Methotrexate . b.S/p Simponi, loss of response. c.S/p Enbrel,loss of response. d.S/p Remicade, loss of response. e.S/p Otezla, no response. f.Humira. g.Leflunomide.   Patient Active Problem List  Diagnosis  . DDD (degenerative disc  disease), lumbar  . Lumbar radiculitis  . Lumbar stenosis with neurogenic claudication  . 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 Osteoarthritis(GWK)  . Psoriatic arthritis(GWK)  . Heme positive stool  . Abnormal liver function (GWK)  . HNP (herniated nucleus pulposus), lumbar  . Ganglion cyst of flexor tendon sheath of finger of right hand  . Complete rotator cuff tear of left shoulder  . Anemia  . CAD (coronary artery disease)  . Diabetes mellitus (CMS/HHS-HCC)  . GERD (gastroesophageal reflux disease)  . History of colonic polyps  . Hyperlipidemia  . Hypertension  . Obstructive sleep apnea  . Sinusitis, unspecified  . Carpal tunnel syndrome on right  . Carpal tunnel syndrome, left  . Status post carpal tunnel release of both wrists  . Acute post-operative pain  . Acute blood loss as cause of postoperative anemia  . Hypoxemia requiring supplemental oxygen  . Esophageal perforation  . S/P  esophageal stent placement  . S/P Left VATS   Review of Systems is positive for: None  Patient Vitals for the past 24 hrs:  BP Temp Temp src Pulse Resp SpO2 Weight  02/20/23 1500 -- -- -- 76 13 97 % --  02/20/23 1400 -- -- -- 80 22 96 % --  02/20/23 1300 -- -- -- 84 21 97 % --  02/20/23 1200 -- 36.7 C (98 F) Oral 78 21 (!) 88 % --  02/20/23 1100 -- -- -- 83 23 91 % --  02/20/23 1000 -- -- -- 83 23 96 % --  02/20/23 0924 -- -- -- 80 21 93 % --  02/20/23 0921 -- -- -- -- -- 92 % --  02/20/23 0900 -- -- -- 76 23 95 % --  02/20/23 0800 112/66 36.5 C (97.7 F) Oral 74 20 96 % --  02/20/23 0625 137/68 36.7 C (98.1 F) Axillary 78 15 95 % --  02/20/23 0619 -- -- -- -- -- -- 80.1 kg (176 lb 9.4 oz)  02/20/23 0610 (!) 153/120 -- -- 82 26 94 % --  02/20/23 0600 (!) 155/75 -- -- 84 -- 95 % --  02/20/23 0100 (!) 143/84 37.8 C (100.1 F) -- 94 (!) 35 92 % --  02/20/23 0006 -- 37.9 C (100.2 F) Tympanic -- -- -- --  02/20/23 0000 (!) 140/83 -- -- 96 20 96 % --  02/19/23  2359 -- -- -- 97 -- -- --  02/19/23 2304 -- -- -- 102 -- -- --  02/19/23 2248 135/86 (!) 38.4 C (101.1 F) Tympanic 106 21 94 % 78 kg (171 lb 15.3 oz)   Vitals:   02/20/23 1500  BP:   Pulse: 76  Resp: 13  Temp:     PHYSICAL EXAM:  Mental Status Examination: normal Airway Examination: Grossly patent Neck Examination: full range of motion Respiratory Examination: Breathing comfortably on room air without conversational dyspnea CV Examination: Acyanotic Site marked: Not applicable  Recent Labs  Lab 02/19/23 2247  PT 15.5*  INR 1.4*  APTT <20.0*   Recent Labs  Lab 02/19/23 2247 02/20/23 0723 02/20/23 0815 02/20/23 1213  NA 141  137 136   < > 134*  K 3.7 3.3*  --   --   CL 107 103  --   --   CO2 18* 23  --   --   BUN 10 13  --   --   CREATININE 1.0 0.9  --   --   GLUCOSE 258* 138  --   --   CALCIUM  8.5* 8.4*  --   --   ALB 3.7  --   --   --   TBILI 1.2  --   --   --   AST 22  --   --   --   ALT 13*  --   --   --   ALKPHOS 44  --   --   --    < > = values in this interval not displayed.    Prior Imaging: Reviewed  Assessment/Plan: 1. Procedure: GJ placement  indicated. Site has not been marked because it's not applicable to this case. This procedure has been fully reviewed with the patient, and written informed consent has been obtained. 2. Sedation: ASA Grade Assessment: 3 - A patient with severe systemic disease.   Moderate sedation is appropriate for this patient at this time.   3. Prophylactic Antibiotics: The patient will require  Ancef  (cefazolin ) to be administered in VIR immediately prior to the procedure.   4. Blood product transfusion: Transfusion not anticipated  5.Post-procedure care: Inpatient to return to his/her room  6. Discussed with VIR attending: Dr. Lynwood Tanda Frederic Tyler MD PGY-2 [facility]  IR Pager: [phone]

## 2023-02-21 DIAGNOSIS — K223 Perforation of esophagus: Secondary | ICD-10-CM | POA: Diagnosis not present

## 2023-02-22 NOTE — Progress Notes (Signed)
 Thoracic Surgery Progress Note     Date of Service: 02/22/2023 Attending: Elmo Donnice Mcknight, * Hospital Admission Date:   02/19/2023   Hosp day: 2                    Postop day: 2 Days Post-Op  73 yo from OSH with esophageal perforation s/p EGD with esophageal stent placement and L VATS mediastinal washout on 2/5  PMH:  CAD s/p RCA stent, HTN, DM, OSA, psoriatic arthritis, esophageal strictures s/p dilations, prior MW tear, HH, GERD, HL, OSA, s/p sinus surgery, C3-C5 anterior cervical discectomy and fusion, rotator cuff repair, s/p cholecystectomy, hernia repair, former smoker TLD: mediastinal blake drain, L CT to water seal   Interval Events   - transferred out of CTICU and now in stepdown unit - no acute events overnight - GJ tube placed with radiology which he tolerated well - some pneumoperitoneum on plain film - favored to be due to percutaneous GJ tube placement after discussion with radiology team   Vital Signs     Current Vital Signs 24h Vital Sign Ranges  T 37.3 C (99.1 F) (02/22/23 1000) Temp  Avg: 36.8 C (98.3 F)  Min: 36.5 C (97.7 F)  Max: 37.3 C (99.1 F)  BP (!) 185/91 (02/22/23 1000) BP  Min: 122/57  Max: 251/106  HR 87 (02/22/23 1000) Pulse  Avg: 85.1  Min: 70  Max: 91  RR 24 (02/22/23 1000) Resp  Avg: 19.2  Min: 13  Max: 37  O2sat 97 % High flow nasal cannula (02/22/23 0400) SpO2  Avg: 94.4 %  Min: 90 %  Max: 99 %   Intake/Output (24 hours): I/O last 2 completed shifts: In: 171.9 [I.V.:111.9; NG/GT:60] Out: 1660 [Urine:1590; Chest Tube:70] Last Weight: 83.4 kg (183 lb 13.8 oz) (02/22/23 0500)  Admit Weight: 78 kg (171 lb 15.3 oz) (02/19/23 2248)  AFP:Anib mass index is 27.55 kg/m. Height:      Physical Exam   PHYSICAL EXAM: General: alert, cooperative, in NAD Cardiovascular: RRR Respiratory: non-labored on 2L O2 via [place] Abdomen: Soft, ND, not TTP. No rebound or guarding. GJ tube in place.  Data   Labs:  Recent Labs  Lab  02/20/23 0723 02/20/23 0815 02/20/23 1213 02/20/23 1936 02/21/23 0318 02/22/23 0410  NA 136   < > 134*  --  137 137  K 3.3*  --   --  4.1 3.6 3.2*  CO2 23  --   --   --  24 25  BUN 13  --   --   --  15 12  CREATININE 0.9  --   --   --  0.9 0.7  CALCIUM  8.4*  --   --   --  8.2* 8.0*  MG 2.1  --   --  1.7* 1.9 1.6*   < > = values in this interval not displayed.   Recent Labs  Lab 02/20/23 0723 02/21/23 0318 02/22/23 0410  WBC 10.4* 9.2 8.8  HGB 12.4* 11.9* 12.1*  HCT 35.5* 34.8* 35.2*  PLT 152 119* 126*     Recent Labs  Lab 02/20/23 0723 02/21/23 0318 02/22/23 0410  WBC 10.4* 9.2 8.8  HGB 12.4* 11.9* 12.1*  HCT 35.5* 34.8* 35.2*  PLT 152 119* 126*   Recent Labs  Lab 02/19/23 2247  APTT <20.0*  INR 1.4*    No results for input(s): HEPARIN  in the last 168 hours.   Glucose range:  Recent Labs  Lab 02/21/23 1803 02/22/23 0100 02/22/23 0611 02/22/23 1225  POCGLU 99 157* 139 196*    Additional Labs: No results for input(s): COLORU, CLARITYU, SPECGRAV, LABPH, PROTEINUA, GLUCOSEU, KETONESU, BLOODU, NITRITE, LEUKOCYTESUR, BILIRUBINUR, UROBILINOGEN, RBCUA, WBCUA, SQUAMEPI, UOTHEPI, HYALINE, CASTUA, BACTERIA, UCLINITST in the last 168 hours.  Microbiology: Lab Results  Component Value Date   BLDCULT No growth at 48 hours 02/19/2023   BLDCULT No growth at 48 hours 02/19/2023   No results found for: URCULT No results found for: CULTRESP Lab Results  Component Value Date   LABGRAM 3+ Polymorphonuclear leukocytes (!) 02/20/2023   LABGRAM 2+ Gram positive cocci (!) 02/20/2023   LABGRAM 1+ Gram positive rods (!) 02/20/2023   BFLDC 3+ Mixed gram positive organisms (!) 02/20/2023   BFLDC Including Streptococcus agalactiae, group B (!) 02/20/2023   BFLDC 1+ Staphylococcus aureus (!) 02/20/2023   BFLDC Yeast (!) 02/20/2023    Ins/Outs: I/O last 3 completed shifts: In: 2755.1 [I.V.:495.1; NG/GT:410; IV  Piggyback:1850] Out: 2625 [Urine:2320; Emesis/NG output:25; Chest Tube:280] I/O this shift: In: 0  Out: 1000 [Urine:600; Emesis/NG output:400]   Output by Drain (mL) 02/20/23 0701 - 02/20/23 1900 02/20/23 1901 - 02/21/23 0700 02/21/23 0701 - 02/21/23 1900 02/21/23 1901 - 02/22/23 0700 02/22/23 0701 - 02/22/23 1231  Requested LDAs do not have output data documented.      Pertinent Imaging:   CXR: Results for orders placed during the hospital encounter of 02/19/23  X-ray chest PA and lateral (Preliminary) This result has not been signed. Information might be incomplete.  Narrative XR CHEST PA AND LATERAL  INDICATION: Tube Position, Line Position, Lung Aeration, T85.528S Displacement of other gastrointestinal prosthetic devices, implants and grafts, sequela.  COMPARISON: Chest radiograph 02/21/23  FINDINGS/  Impression 1.   Interval removal of endotracheal feeding tube and placement of endogastric tube. Redemonstrated esophageal stent. Unchanged positioning of 2 left hemithorax chest tubes. 2.  Unchanged cardiac and mediastinal contours. 3.  Unchanged low lung with bibasilar opacities which could be atelectasis.  4.  Redistribution of moderate right pleural effusion. 5.  Redemonstrated pneumoperitoneum, mildly increased along the left hemidiaphragm which may be secondary to changes in patient positioning. Correlated with exam.  Findings were transmitted via telephone with Schapps MD by Shane Lent, MD at 02/22/2023 12:16 PM.  Electronically Reviewed by:  Shane Lent, MD, [facility] Electronically Reviewed on:  02/22/2023 12:27 PM  No results found.  CT: No results found for this or any previous visit.    Medications   Scheduled . aspirin   150 mg Rectal Daily  . bisacodyL   10 mg Rectal Daily  . ceFEPIme (MAXIPIME) IV Traditional Infusion  1 g Intravenous Q6H  . fluconazole (DIFLUCAN) IV  400 mg Intravenous Q24H  . heparin  (porcine)  5,000 Units Subcutaneous Q8H  SCH  . insulin  GLARGINE-yfgn  12 Units Subcutaneous Daily 1200  . insulin  REGULAR (HumuLIN R , NovoLIN R) injection  0-8 Units Subcutaneous Q6H SCH  . lidocaine   1 patch Transdermal Q24H  . lidocaine   2 patch Transdermal Q24H  . linezolid  (ZYVOX ) IV  600 mg Intravenous Q12H SCH  . metoprolol  TARTrate  5 mg Intravenous Q6H SCH  . metroNIDAZOLE   500 mg Intravenous Q12H  . pantoprazole  (PROTONIX ) IV  40 mg Intravenous Daily    . dextrose  5%- lactated ringers 75 mL/hr at 02/21/23 2150  . HYDROmorphone  PCA 1 mg/mL (opioid-naive)    . sodium chloride  10 mL/hr at 02/21/23 1111  . tube feeding      PRNs dextrose ,  dextrose  50% in water, glucagon, hydrALAZINE , ipratropium-albuteroL , naloxone, ondansetron , sodium chloride    Assessment & Plan   Shane Mcknight is a 73 y.o. male with PMHx of  CAD s/p RCA stent, HTN, DM, OSA, psoriatic arthritis, esophageal strictures s/p dilations, prior MW tear, HH, GERD, HL, OSA, s/p sinus surgery, C3-C5 anterior cervical discectomy and fusion, rotator cuff repair, s/p cholecystectomy, hernia repair, former smoker who presented to with esophageal perforation and underwent EGD with esophageal stent placement and left VATS mediastinal washout on 02/19/2023 with Dr. Elmo.  Today, the patient is slowly improving. Pneumoperitoneum on plain film favored to be due to percutaneous GJ placement with radiology yesterday given proximity to procedure and lack of peritonitic symptoms. Was also present on previous images. Today, we will clamp the NG tube, open the G port to gravity drainage, and begin feeding the J-port.   Hospital Problem List: Active Problems:   Psoriatic arthritis([facility])   CAD (coronary artery disease)   Diabetes mellitus (CMS/HHS-HCC)   Hypertension   Acute post-operative pain   Acute blood loss as cause of postoperative anemia   Hypoxemia requiring supplemental oxygen   Esophageal perforation   S/P  esophageal stent placement   S/P Left VATS    Leukocytosis   Hypomagnesemia   Hypokalemia   Former tobacco use    - TLD: NG Tube, GJ tube, left chest tube - Maintain chest tube to: water seal - Clamp NG tube on 2/8 - Vent G port of GJ tube to gravity on 2/8 - start J port tube feeds on 2/8 and advance as tolerated - Diet: DIET NPO Effective Now tube feeding (NUTREN 1.5) liquid - IVF/Infusions:  . dextrose  5%- lactated ringers 75 mL/hr at 02/21/23 2150  . HYDROmorphone  PCA 1 mg/mL (opioid-naive)    . sodium chloride  10 mL/hr at 02/21/23 1111  . tube feeding     - Foley: keep for now - Replete electrolytes to goal K 4.5, Mg 2.5 - Anticoagulation/DVT ppx: SQ Heparin , SCDs - Abx: ID following, appreciate involvement.  Cefepime 2/6 -   Linezolid  2/5 -  Fluconazole 2/5 -  Flagyl  2/6 -   Additional Problem List: - HTN: prn hydralazine    Code Status: Full Code Disposition: PT Assessment: Home (02/21/23 0510) RN Assessment: Slightly limited (02/21/23 2145) Discharge Planning: pending imaging   Discussed with CT surgery fellow Dr. Hattie and attending Dr. Elmo.  LANETA HARK, MD General Surgery Please page functional pager for assistance: TSU1 Kerrin Starlin [place]): [phone] TSU2 Thurmon Sours): [phone]

## 2023-03-03 DIAGNOSIS — K223 Perforation of esophagus: Secondary | ICD-10-CM | POA: Diagnosis not present

## 2023-03-04 ENCOUNTER — Telehealth: Payer: Self-pay

## 2023-03-04 NOTE — Transitions of Care (Post Inpatient/ED Visit) (Signed)
03/04/2023  Name: Shane Mcknight MRN: [id number] DOB: [birth year]  Today's TOC FU Call Status: Today's TOC FU Call Status:: Successful TOC FU Call Completed Unsuccessful Call (1st Attempt) Date: 03/04/23 Patient's Name and Date of Birth confirmed.  Transition Care Management Follow-up Telephone Call Date of Discharge: 03/03/23 Discharge Facility: Other ([facility]) Name of Other ([facility]) Discharge Facility: [facility] Type of Discharge: Inpatient Admission Primary Inpatient Discharge Diagnosis:: Esophageal Perforation How have you been since you were released from the hospital?: Better Any questions or concerns?: No  Items Reviewed: Did you receive and understand the discharge instructions provided?: Yes  Medications Reviewed Today: Medications Reviewed Today     Reviewed by Redge Gainer, RN (Case Manager) on 03/04/23 at 1551  Med List Status: <None>   Medication Order Taking? Sig Documenting Provider Last Dose Status Informant  acetaminophen (TYLENOL) 500 MG tablet [id number]  Take 1,000 mg by mouth daily as needed for moderate pain. [provider]  Active Self  aspirin EC 81 MG tablet [id number]  Take 81 mg by mouth every evening.  [provider]  Active Self  atenolol (TENORMIN) 50 MG tablet [id number] Yes TAKE 1 TABLET BY MOUTH TWICE  DAILY Dale Pigeon, MD Taking Active   clindamycin (CLEOCIN) 150 MG capsule [id number] Yes Take 450 mg by mouth 3 (three) times daily. [provider]  Active   doxycycline (VIBRA-TABS) 100 MG tablet [id number] No Take 1 tablet (100 mg total) by mouth 2 (two) times daily.  Patient not taking: Reported on 03/04/2023   Dale Sparks, MD Not Taking Active   fluconazole (DIFLUCAN) 200 MG tablet [id number] Yes Take 200 mg by mouth daily. [provider]  Active   folic acid (FOLVITE) 1 MG tablet [id number]  Take 1 mg by mouth daily. [provider]  Active Self  gabapentin (NEURONTIN) 300 MG  capsule [id number]  TAKE 1 TO 2 CAPSULES BY  MOUTH AT BEDTIME  Patient taking differently: TAKE 1 TO 2 CAPSULES BY  MOUTH AT BEDTIME   Dale [place], MD  Active Self           Med Note Hart Rochester, Lafe Garin   Wed Apr 04, 2021 11:46 AM)    glipiZIDE (GLUCOTROL) 5 MG tablet [id number] No Take 1 tablet (5 mg total) by mouth 2 (two) times daily before a meal.  Patient not taking: Reported on 03/04/2023   Dale Assumption, MD Not Taking Active   glucose blood (ONETOUCH ULTRA) test strip [id number]  Used to check blood glucose one time per day. Dale Shong[place], MD  Active   hydrALAZINE (APRESOLINE) 25 MG tablet [id number]  Take 25 mg by mouth 3 (three) times daily. [provider]  Active   insulin NPH Human (NOVOLIN N) 100 UNIT/ML injection [id number] Yes Inject 24 Units into the skin at bedtime. [provider]  Active   insulin regular (NOVOLIN R) 100 units/mL injection [id number] Yes Inject 14 Units into the skin daily with supper. Before dinner [provider]  Active   isosorbide mononitrate (IMDUR) 30 MG 24 hr tablet [id number]  TAKE ONE-HALF TABLET BY MOUTH  DAILY Iran Ouch, MD  Active   losartan (COZAAR) 25 MG tablet [id number]  TAKE ONE-HALF TABLET BY MOUTH  DAILY Dale Helen, MD  Active   melatonin 5 MG TABS [id number]  Take 5 mg by mouth at bedtime as needed. [provider]  Active   methotrexate (RHEUMATREX) 2.5 MG tablet [id number]  Take 2.5 mg by mouth once a  week. Pt states he takes 8 (2.5mg ) tablets weekly [provider]  Active   nitroGLYCERIN (NITROSTAT) 0.4 MG SL tablet [id number]  Place 1 tablet (0.4 mg total) under the tongue every 5 (five) minutes as needed. MAXIMUM OF 3 DOSES. Creig Hines, NP  Active Self  pantoprazole (PROTONIX) 40 MG tablet [id number]  TAKE 1 TABLET BY MOUTH TWICE  DAILY Dale [place], MD  Active   rosuvastatin (CRESTOR) 20 MG tablet [id number]  Take 1 tablet (20 mg total) by mouth daily. Dale [place],  MD  Active   SYRINGE-NEEDLE, DISP, 3 ML 25G X 5/8" 3 ML MISC [id number]  Use as instructed with B12 injection. Dale Reynolds, MD  Active Self  vitamin E 400 UNIT capsule [id number]  Take 400 Units by mouth in the morning. [provider]  Active Self            Home Care and Equipment/Supplies:    Functional Questionnaire:    Follow up appointments reviewed:    SDOH Interventions Today    Flowsheet Row Most Recent Value  SDOH Interventions   Food Insecurity Interventions Intervention Not Indicated  Housing Interventions Intervention Not Indicated  Transportation Interventions Intervention Not Indicated  Utilities Interventions Intervention Not Indicated      Interventions Today    Flowsheet Row Most Recent Value  Chronic Disease   Chronic disease during today's visit Other  [Esophageal perforation]  General Interventions   General Interventions Discussed/Reviewed General Interventions Discussed, General Interventions Reviewed  Exercise Interventions   Exercise Discussed/Reviewed Physical Activity  Physical Activity Discussed/Reviewed Physical Activity Discussed  Education Interventions   Education Provided Provided Education  Provided Verbal Education On Insurance Plans, Medication, Nutrition  [Discussed meals and transportation that is provided by insurance even though he doesn't need it at this time]  Nutrition Interventions   Nutrition Discussed/Reviewed Nutrition Discussed, Nutrition Reviewed  Pharmacy Interventions   Pharmacy Dicussed/Reviewed Medications and their functions       TOC Outreach completed to the patient. He is adjusting to his tube feeding and small oral feeds. He feels comfortable with administration of his insulin and taking his blood sugar. New medications reviewed and updated in the EMR. He declines PCP appointment right now due to his other appointments and states he will call when he gets through his GI appointments and when his  tube feed has stopped. He declined home therapy services.  Deidre Ala, BSN, RN [place]  [facility]BCI - [employer] [facility] RN Care Manager [phone]

## 2023-03-04 NOTE — Transitions of Care (Post Inpatient/ED Visit) (Signed)
   03/04/2023  Name: Shane Mcknight MRN: [id number] DOB: 03-29-1950  Today's TOC FU Call Status: Today's TOC FU Call Status:: Unsuccessful Call (1st Attempt) Unsuccessful Call (1st Attempt) Date: 03/04/23  Attempted to reach the patient regarding the most recent Inpatient/ED visit.  Follow Up Plan: Additional outreach attempts will be made to reach the patient to complete the Transitions of Care (Post Inpatient/ED visit) call.   Deidre Ala, BSN, RN [place]  [facility] - [employer] [facility] RN Care Manager [phone]

## 2023-03-04 NOTE — Telephone Encounter (Signed)
Copied from CRM [id number]. Topic: Clinical - Medical Advice >> Mar 04, 2023  8:59 AM Orinda Kenner C wrote: Reason for CRM: Patient wants to have Trish call back at [phone] reagrding his ongoing health issues, patient states they know and will not provided any further details.

## 2023-03-04 NOTE — Telephone Encounter (Signed)
Noted.  Thanks.  Agree, let us know if needs anything.

## 2023-03-04 NOTE — Telephone Encounter (Signed)
FYI-  Patient was just calling to let us know that he was in the hospital for esophageal perforation. He is being followed by [facility] for this. Has HFU appt scheduled with them. Patient declined appointment with our office at this time. He said he just wanted to continue to see them until he is healed and then will come in for an appointment with you. Advised pt to just let us know if he needs anything.

## 2023-03-09 DIAGNOSIS — K223 Perforation of esophagus: Secondary | ICD-10-CM | POA: Diagnosis not present

## 2023-03-12 ENCOUNTER — Other Ambulatory Visit: Payer: Medicare Other

## 2023-03-12 DIAGNOSIS — K223 Perforation of esophagus: Secondary | ICD-10-CM | POA: Diagnosis not present

## 2023-03-14 ENCOUNTER — Ambulatory Visit: Payer: Medicare Other | Admitting: Internal Medicine

## 2023-03-15 DIAGNOSIS — K223 Perforation of esophagus: Secondary | ICD-10-CM | POA: Diagnosis not present

## 2023-03-18 DIAGNOSIS — K223 Perforation of esophagus: Secondary | ICD-10-CM | POA: Diagnosis not present

## 2023-03-21 DIAGNOSIS — K219 Gastro-esophageal reflux disease without esophagitis: Secondary | ICD-10-CM | POA: Diagnosis not present

## 2023-03-21 DIAGNOSIS — Z794 Long term (current) use of insulin: Secondary | ICD-10-CM | POA: Diagnosis not present

## 2023-03-21 DIAGNOSIS — G4733 Obstructive sleep apnea (adult) (pediatric): Secondary | ICD-10-CM | POA: Diagnosis not present

## 2023-03-21 DIAGNOSIS — Z87891 Personal history of nicotine dependence: Secondary | ICD-10-CM | POA: Diagnosis not present

## 2023-03-21 DIAGNOSIS — T85528S Displacement of other gastrointestinal prosthetic devices, implants and grafts, sequela: Secondary | ICD-10-CM | POA: Diagnosis not present

## 2023-03-21 DIAGNOSIS — K223 Perforation of esophagus: Secondary | ICD-10-CM | POA: Diagnosis not present

## 2023-03-21 DIAGNOSIS — Z9689 Presence of other specified functional implants: Secondary | ICD-10-CM | POA: Diagnosis not present

## 2023-03-21 DIAGNOSIS — Z7984 Long term (current) use of oral hypoglycemic drugs: Secondary | ICD-10-CM | POA: Diagnosis not present

## 2023-03-21 DIAGNOSIS — E1165 Type 2 diabetes mellitus with hyperglycemia: Secondary | ICD-10-CM | POA: Diagnosis not present

## 2023-03-24 DIAGNOSIS — K223 Perforation of esophagus: Secondary | ICD-10-CM | POA: Diagnosis not present

## 2023-03-25 DIAGNOSIS — G4733 Obstructive sleep apnea (adult) (pediatric): Secondary | ICD-10-CM | POA: Diagnosis not present

## 2023-03-25 DIAGNOSIS — E1369 Other specified diabetes mellitus with other specified complication: Secondary | ICD-10-CM | POA: Diagnosis not present

## 2023-03-25 DIAGNOSIS — K223 Perforation of esophagus: Secondary | ICD-10-CM | POA: Diagnosis not present

## 2023-03-25 DIAGNOSIS — I2510 Atherosclerotic heart disease of native coronary artery without angina pectoris: Secondary | ICD-10-CM | POA: Diagnosis not present

## 2023-03-25 DIAGNOSIS — T85528S Displacement of other gastrointestinal prosthetic devices, implants and grafts, sequela: Secondary | ICD-10-CM | POA: Diagnosis not present

## 2023-03-25 DIAGNOSIS — Z01818 Encounter for other preprocedural examination: Secondary | ICD-10-CM | POA: Diagnosis not present

## 2023-03-27 DIAGNOSIS — K223 Perforation of esophagus: Secondary | ICD-10-CM | POA: Diagnosis not present

## 2023-03-28 DIAGNOSIS — L4050 Arthropathic psoriasis, unspecified: Secondary | ICD-10-CM | POA: Diagnosis not present

## 2023-03-28 DIAGNOSIS — Z87891 Personal history of nicotine dependence: Secondary | ICD-10-CM | POA: Diagnosis not present

## 2023-03-28 DIAGNOSIS — Z8719 Personal history of other diseases of the digestive system: Secondary | ICD-10-CM | POA: Diagnosis not present

## 2023-03-28 DIAGNOSIS — K223 Perforation of esophagus: Secondary | ICD-10-CM | POA: Diagnosis not present

## 2023-03-28 DIAGNOSIS — I10 Essential (primary) hypertension: Secondary | ICD-10-CM | POA: Diagnosis not present

## 2023-03-28 DIAGNOSIS — G4733 Obstructive sleep apnea (adult) (pediatric): Secondary | ICD-10-CM | POA: Diagnosis not present

## 2023-03-28 DIAGNOSIS — E119 Type 2 diabetes mellitus without complications: Secondary | ICD-10-CM | POA: Diagnosis not present

## 2023-03-28 DIAGNOSIS — Z9049 Acquired absence of other specified parts of digestive tract: Secondary | ICD-10-CM | POA: Diagnosis not present

## 2023-03-30 DIAGNOSIS — K223 Perforation of esophagus: Secondary | ICD-10-CM | POA: Diagnosis not present

## 2023-04-02 DIAGNOSIS — R54 Age-related physical debility: Secondary | ICD-10-CM | POA: Diagnosis not present

## 2023-04-02 DIAGNOSIS — Z79899 Other long term (current) drug therapy: Secondary | ICD-10-CM | POA: Diagnosis not present

## 2023-04-02 DIAGNOSIS — K223 Perforation of esophagus: Secondary | ICD-10-CM | POA: Diagnosis not present

## 2023-04-02 DIAGNOSIS — I2510 Atherosclerotic heart disease of native coronary artery without angina pectoris: Secondary | ICD-10-CM | POA: Diagnosis not present

## 2023-04-02 DIAGNOSIS — Z955 Presence of coronary angioplasty implant and graft: Secondary | ICD-10-CM | POA: Diagnosis not present

## 2023-04-02 DIAGNOSIS — Z7984 Long term (current) use of oral hypoglycemic drugs: Secondary | ICD-10-CM | POA: Diagnosis not present

## 2023-04-02 DIAGNOSIS — E119 Type 2 diabetes mellitus without complications: Secondary | ICD-10-CM | POA: Diagnosis not present

## 2023-04-02 DIAGNOSIS — Z87891 Personal history of nicotine dependence: Secondary | ICD-10-CM | POA: Diagnosis not present

## 2023-04-02 DIAGNOSIS — I10 Essential (primary) hypertension: Secondary | ICD-10-CM | POA: Diagnosis not present

## 2023-04-02 DIAGNOSIS — G4733 Obstructive sleep apnea (adult) (pediatric): Secondary | ICD-10-CM | POA: Diagnosis not present

## 2023-04-02 DIAGNOSIS — K219 Gastro-esophageal reflux disease without esophagitis: Secondary | ICD-10-CM | POA: Diagnosis not present

## 2023-04-02 DIAGNOSIS — Z7982 Long term (current) use of aspirin: Secondary | ICD-10-CM | POA: Diagnosis not present

## 2023-04-16 DIAGNOSIS — Z79899 Other long term (current) drug therapy: Secondary | ICD-10-CM | POA: Diagnosis not present

## 2023-04-16 DIAGNOSIS — L4050 Arthropathic psoriasis, unspecified: Secondary | ICD-10-CM | POA: Diagnosis not present

## 2023-04-17 DIAGNOSIS — L4050 Arthropathic psoriasis, unspecified: Secondary | ICD-10-CM | POA: Diagnosis not present

## 2023-04-25 DIAGNOSIS — K223 Perforation of esophagus: Secondary | ICD-10-CM | POA: Diagnosis not present

## 2023-04-30 ENCOUNTER — Telehealth: Payer: Self-pay | Admitting: Internal Medicine

## 2023-04-30 NOTE — Telephone Encounter (Signed)
 Pt stopped by to drop off Application for Renewal of Disability Parking Placard. States his current placard termed at the end of March this year. Application is up front in Dr [employer]. [facility]

## 2023-05-05 NOTE — Telephone Encounter (Signed)
 Copied from CRM [id number]. Topic: General - Call Back - No Documentation >> May 05, 2023 12:32 PM Shereese L wrote: Reason for CRM: patient is waiting a call back to pick up renewal papers for handicap decal that was expired the end of march

## 2023-05-05 NOTE — Telephone Encounter (Signed)
Placed out for your signature

## 2023-05-05 NOTE — Telephone Encounter (Signed)
 Signed. Was given to pt per Vanessa.

## 2023-05-30 ENCOUNTER — Encounter: Payer: Self-pay | Admitting: Medical

## 2023-05-30 ENCOUNTER — Ambulatory Visit: Attending: Medical | Admitting: Medical

## 2023-05-30 VITALS — BP 155/84 | Resp 16 | Ht 68.0 in | Wt 171.4 lb

## 2023-05-30 DIAGNOSIS — I5022 Chronic systolic (congestive) heart failure: Secondary | ICD-10-CM

## 2023-05-30 DIAGNOSIS — E782 Mixed hyperlipidemia: Secondary | ICD-10-CM | POA: Diagnosis not present

## 2023-05-30 DIAGNOSIS — I10 Essential (primary) hypertension: Secondary | ICD-10-CM

## 2023-05-30 DIAGNOSIS — I2510 Atherosclerotic heart disease of native coronary artery without angina pectoris: Secondary | ICD-10-CM | POA: Diagnosis not present

## 2023-05-30 DIAGNOSIS — I779 Disorder of arteries and arterioles, unspecified: Secondary | ICD-10-CM

## 2023-05-30 DIAGNOSIS — I6523 Occlusion and stenosis of bilateral carotid arteries: Secondary | ICD-10-CM

## 2023-05-30 NOTE — Progress Notes (Signed)
 Cardiology Office Note:  .   Date:  06/04/2023  ID:  Shane Mcknight, DOB 09-Mar-1950, MRN [id number] PCP: Dellar Fenton, MD  [facility] Providers Cardiologist:  Antionette Kirks, MD     History of Present Illness: Shane Mcknight   Shane Mcknight is a 73 y.o. male  with a past medical history of CAD with DES to distal RCA 2018, carotid artery disease, chronic systolic heart failure, hypertension, hyperlipidemia, T2DM who presents to the clinic today for follow-up.  Shane Mcknight was first evaluated by cardiology on 05/21/2016 for chest pain at the request of Dr. Debrah Fan. Stress test showed intermediate risk with medium defect of moderate severity in basal inferoseptal, basal inferior, mid inferoseptal and mid inferior location. Findings were consistent with prior MI and significant peri-infarct ischemia. LHC showed significant two vessel CAD with severe stenosis of distal RCA. Patient underwent successful PCI with DES to RCA. Repeat LHC in August 2020 was essentially unchanged with patent distal RCA stent, completely occulded OM1, and progression of disease proximal to stent. Last nuclear stress test in March 2022 was a normal, low risk study. Echo in March 2022 with EF 55-60%, grade II DD.   The patient was last seen 12/2021 and was stable from a cardiac perspective.   He was admitted in 02/2023 at [facility] for esophageal tear. He had a piece of ham that got stuck. He started bleeding and went to the ER. He was taken to [facility] for emergent surgery. CT showed pneumomediastinum and fluid collection c/f esophageal rupture. He was treated with IV abx.  Today, the patient has been good from a cardiac perspective. BP has been good at home. He denies, SOB. He has occasional right leg swelling. He thinks it's from arthritis. He is still working. He denies orthopnea or pnd. He has hiatal hernia and relfux.   Studies Reviewed: Shane Mcknight   EKG Interpretation Date/Time:  Friday May 30 2023 15:45:19 EDT Ventricular Rate:  63 PR  Interval:  174 QRS Duration:  94 QT Interval:  398 QTC Calculation: 407 R Axis:   -34  Text Interpretation: Normal sinus rhythm Left axis deviation Possible Anterior infarct , age undetermined When compared with ECG of 22-Sep-2018 18:52, QRS axis Shifted left Borderline criteria for Anterior infarct are now Present Confirmed by Toribio Frees ([id number]) on 05/30/2023 3:48:51 PM    Carotid US  01/2022 Summary:  Right Carotid: Velocities in the right ICA are consistent with a 40-59%                 stenosis.   Left Carotid: Velocities in the left ICA are consistent with a 1-39%  stenosis.               The ECA appears >50% stenosed.   Vertebrals:  Left vertebral artery demonstrates antegrade flow. Right  vertebral              artery demonstrates high resistant flow. Atypical antegrade.  Subclavians: Right subclavian artery flow was disturbed. Normal flow               hemodynamics were seen in the left subclavian artery.   *See table(s) above for measurements and observations.  Suggest follow up study in 12 months.    Echo 2022 1. Left ventricular ejection fraction, by estimation, is 55 to 60%. The  left ventricle has normal function. The left ventricle has no regional  Moose motion abnormalities. Left ventricular diastolic parameters are  consistent with Grade II diastolic  dysfunction (  pseudonormalization).   2. Right ventricular systolic function is normal. The right ventricular  size is normal.   3. Left atrial size was mildly dilated.   4. The mitral valve is normal in structure. No evidence of mitral valve  regurgitation.   5. The aortic valve is tricuspid. Aortic valve regurgitation is not  visualized.   6. The inferior vena cava is normal in size with greater than 50%  respiratory variability, suggesting right atrial pressure of 3 mmHg.   MPI 2022 Narrative & Impression  There was no ST segment deviation noted during stress. No T wave inversion was noted during  stress. The study is normal. This is a low risk study. The left ventricular ejection fraction is normal (55-65%). CT attenuation images show evidence of aortic and coronary calcifications.    LHC 2020 Ost LM to LM lesion is 40% stenosed. Mid LAD lesion is 50% stenosed. Prox LAD lesion is 30% stenosed. Ost 1st Diag to 1st Diag lesion is 60% stenosed. Ost Cx lesion is 40% stenosed. 1st Mrg lesion is 100% stenosed. Prox RCA to Mid RCA lesion is 60% stenosed. Dist RCA-1 lesion is 60% stenosed. Previously placed Dist RCA-2 stent (unknown type) is widely patent. RPAV lesion is 50% stenosed. The left ventricular systolic function is normal. LV end diastolic pressure is mildly elevated. The left ventricular ejection fraction is 55-65% by visual estimate.   1.  Patent distal RCA stent with no significant restenosis.  Stable moderate disease in the mid LAD and proximal right coronary artery.  OM1 is now completely occluded.  It was 99% stenosed before but overall a small branch.  The only difference from most recent angiography is progression of distal RCA disease just proximal to the stent in a tortuous segment causing about 60% stenosis. 2.  Normal LV systolic function mildly elevated left ventricular end-diastolic pressure.   Recommendations: No clear culprit for patient's anginal symptoms although he does have known moderate three-vessel coronary artery disease.  I am going to add Imdur  30 mg once daily.  I discontinued hydrochlorothiazide  to avoid hypotension. If the patient has refractory anginal symptoms in spite of optimizing his antianginal medications, stress testing can be considered to see if he is ischemic in the RCA or LAD distribution.      Physical Exam:   VS:  BP (!) 155/84 (BP Location: Left Arm, Patient Position: Sitting, Cuff Size: Normal)   Resp 16   Ht 5\' 8"  (1.727 m)   Wt 171 lb 6.4 oz (77.7 kg)   SpO2 98%   BMI 26.06 kg/m    Wt Readings from Last 3 Encounters:   05/30/23 171 lb 6.4 oz (77.7 kg)  02/19/23 172 lb (78 kg)  12/09/22 165 lb (74.8 kg)    GEN: Well nourished, well developed in no acute distress NECK: No JVD; No carotid bruits CARDIAC: RRR, no murmurs, rubs, gallops RESPIRATORY:  Clear to auscultation without rales, wheezing or rhonchi  ABDOMEN: Soft, non-tender, non-distended EXTREMITIES:  No edema; No deformity   ASSESSMENT AND PLAN: .    CAD  LHC in 08/2018 showed patent stent and stable moderate CAD. Nuclear stress test in March 2022 was normal, low risk study. Patient denies anginal symptoms. Continue ASA, atenolol , Imdur , Crestor  and SL NTG.   Carotid artery diease Us  01/2022 showed right ICA stenosis 40-59% and left ICA 1-39%. I will repeat a carotid US . Continue ASA and Crestor .  Chronic systolic heart failire Echo in March 2022 showed LVEF 55-60%, G2DD. He  is euvolemic on exam. Continue Atenolol , Losartan , Jardiance  and Imdur .   HTN BP is elevated, but he says it's normal at home. Continue Atenolol , Losartan  and Imdur .   HLD LDL 62 . Continue Crestor  20mg  daily.         Dispo: Follow-up in 1 year  Signed, Zelpha Messing Rebekah [place], PA-C

## 2023-05-30 NOTE — Patient Instructions (Signed)
 Medication Instructions:   Your Physician recommend you continue on your current medication as directed.     *If you need a refill on your cardiac medications before your next appointment, please call your pharmacy*  Lab Work: None ordered.   If you have labs (blood work) drawn today and your tests are completely normal, you will receive your results only by: MyChart Message (if you have MyChart) OR A paper copy in the mail If you have any lab test that is abnormal or we need to change your treatment, we will call you to review the results.  Testing/Procedures:  Your physician has requested that you have a carotid duplex. This test is an ultrasound of the carotid arteries in your neck. It looks at blood flow through these arteries that supply the brain with blood.   Allow one hour for this exam.  There are no restrictions or special instructions.  This will take place at [address] [facility]) [address], [place] [address]  Please note: We ask at that you not bring children with you during ultrasound (echo/ vascular) testing. Due to room size and safety concerns, children are not allowed in the ultrasound rooms during exams. Our front office staff cannot provide observation of children in our lobby area while testing is being conducted. An adult accompanying a patient to their appointment will only be allowed in the ultrasound room at the discretion of the ultrasound technician under special circumstances. We apologize for any inconvenience.   Follow-Up: At [facility], you and your health needs are our priority.  As part of our continuing mission to provide you with exceptional heart care, our providers are all part of one team.  This team includes your primary Cardiologist (physician) and Advanced Practice Providers or APPs (Physician Assistants and Nurse Practitioners) who all work together to provide you with the care you need, when you need it.  Your next  appointment:   1 year(s)  Provider:   You may see Antionette Kirks, MD or one of the following Advanced Practice Providers on your designated Care Team:   Laneta Pintos, NP Gildardo Labrador, PA-C Varney Gentleman, PA-C Cadence Hugoton, PA-C Ronald Cockayne, NP Morey Ar, NP    We recommend signing up for the patient portal called "MyChart".  Sign up information is provided on this After Visit Summary.  MyChart is used to connect with patients for Virtual Visits (Telemedicine).  Patients are able to view lab/test results, encounter notes, upcoming appointments, etc.  Non-urgent messages can be sent to your provider as well.   To learn more about what you can do with MyChart, go to [email or web address].

## 2023-07-04 DIAGNOSIS — R053 Chronic cough: Secondary | ICD-10-CM | POA: Diagnosis not present

## 2023-07-04 DIAGNOSIS — K219 Gastro-esophageal reflux disease without esophagitis: Secondary | ICD-10-CM | POA: Diagnosis not present

## 2023-07-04 DIAGNOSIS — R509 Fever, unspecified: Secondary | ICD-10-CM | POA: Diagnosis not present

## 2023-07-08 ENCOUNTER — Other Ambulatory Visit: Payer: Self-pay | Admitting: Gastroenterology

## 2023-07-08 DIAGNOSIS — R509 Fever, unspecified: Secondary | ICD-10-CM

## 2023-07-08 DIAGNOSIS — R9389 Abnormal findings on diagnostic imaging of other specified body structures: Secondary | ICD-10-CM

## 2023-07-08 DIAGNOSIS — K449 Diaphragmatic hernia without obstruction or gangrene: Secondary | ICD-10-CM

## 2023-07-08 DIAGNOSIS — R053 Chronic cough: Secondary | ICD-10-CM

## 2023-07-08 DIAGNOSIS — K219 Gastro-esophageal reflux disease without esophagitis: Secondary | ICD-10-CM

## 2023-07-09 ENCOUNTER — Ambulatory Visit
Admission: RE | Admit: 2023-07-09 | Discharge: 2023-07-09 | Disposition: A | Discharge status: Home / Self Care | Source: Ambulatory Visit | Attending: Gastroenterology | Admitting: Gastroenterology

## 2023-07-09 DIAGNOSIS — K449 Diaphragmatic hernia without obstruction or gangrene: Secondary | ICD-10-CM | POA: Diagnosis not present

## 2023-07-09 DIAGNOSIS — K223 Perforation of esophagus: Secondary | ICD-10-CM | POA: Diagnosis not present

## 2023-07-09 DIAGNOSIS — R053 Chronic cough: Secondary | ICD-10-CM | POA: Diagnosis not present

## 2023-07-09 DIAGNOSIS — R9389 Abnormal findings on diagnostic imaging of other specified body structures: Secondary | ICD-10-CM | POA: Insufficient documentation

## 2023-07-09 DIAGNOSIS — K219 Gastro-esophageal reflux disease without esophagitis: Secondary | ICD-10-CM | POA: Insufficient documentation

## 2023-07-09 DIAGNOSIS — J432 Centrilobular emphysema: Secondary | ICD-10-CM | POA: Diagnosis not present

## 2023-07-09 MED ORDER — IOHEXOL 300 MG/ML  SOLN
75.0000 mL | Freq: Once | INTRAMUSCULAR | Status: AC | PRN
Start: 2023-07-09 — End: 2023-07-09
  Administered 2023-07-09: 75 mL via INTRAVENOUS

## 2023-07-15 HISTORY — PX: HIATAL HERNIA REPAIR: SHX195

## 2023-07-19 ENCOUNTER — Other Ambulatory Visit: Payer: Self-pay | Admitting: Internal Medicine

## 2023-07-19 DIAGNOSIS — E0822 Diabetes mellitus due to underlying condition with diabetic chronic kidney disease: Secondary | ICD-10-CM

## 2023-07-19 DIAGNOSIS — E785 Hyperlipidemia, unspecified: Secondary | ICD-10-CM

## 2023-07-25 ENCOUNTER — Ambulatory Visit
Admission: RE | Admit: 2023-07-25 | Discharge: 2023-07-25 | Disposition: A | Discharge status: Home / Self Care | Source: Ambulatory Visit | Attending: Gastroenterology | Admitting: Gastroenterology

## 2023-07-25 DIAGNOSIS — R053 Chronic cough: Secondary | ICD-10-CM | POA: Diagnosis not present

## 2023-07-25 DIAGNOSIS — R509 Fever, unspecified: Secondary | ICD-10-CM | POA: Diagnosis not present

## 2023-07-25 DIAGNOSIS — K219 Gastro-esophageal reflux disease without esophagitis: Secondary | ICD-10-CM | POA: Diagnosis not present

## 2023-07-25 DIAGNOSIS — K449 Diaphragmatic hernia without obstruction or gangrene: Secondary | ICD-10-CM | POA: Diagnosis not present

## 2023-07-25 DIAGNOSIS — K224 Dyskinesia of esophagus: Secondary | ICD-10-CM | POA: Diagnosis not present

## 2023-07-28 ENCOUNTER — Ambulatory Visit: Attending: Medical

## 2023-07-28 DIAGNOSIS — I6523 Occlusion and stenosis of bilateral carotid arteries: Secondary | ICD-10-CM | POA: Diagnosis not present

## 2023-07-28 DIAGNOSIS — I779 Disorder of arteries and arterioles, unspecified: Secondary | ICD-10-CM

## 2023-07-29 ENCOUNTER — Ambulatory Visit: Payer: Self-pay | Admitting: Medical

## 2023-07-29 DIAGNOSIS — I6523 Occlusion and stenosis of bilateral carotid arteries: Secondary | ICD-10-CM

## 2023-08-01 ENCOUNTER — Telehealth: Payer: Self-pay | Admitting: Cardiovascular Disease

## 2023-08-01 DIAGNOSIS — I10 Essential (primary) hypertension: Secondary | ICD-10-CM | POA: Diagnosis not present

## 2023-08-01 DIAGNOSIS — I2510 Atherosclerotic heart disease of native coronary artery without angina pectoris: Secondary | ICD-10-CM | POA: Diagnosis not present

## 2023-08-01 DIAGNOSIS — D649 Anemia, unspecified: Secondary | ICD-10-CM | POA: Diagnosis not present

## 2023-08-01 DIAGNOSIS — Z9889 Other specified postprocedural states: Secondary | ICD-10-CM | POA: Diagnosis not present

## 2023-08-01 DIAGNOSIS — K219 Gastro-esophageal reflux disease without esophagitis: Secondary | ICD-10-CM | POA: Diagnosis not present

## 2023-08-01 DIAGNOSIS — K223 Perforation of esophagus: Secondary | ICD-10-CM | POA: Diagnosis not present

## 2023-08-01 DIAGNOSIS — G4730 Sleep apnea, unspecified: Secondary | ICD-10-CM | POA: Diagnosis not present

## 2023-08-01 DIAGNOSIS — K449 Diaphragmatic hernia without obstruction or gangrene: Secondary | ICD-10-CM | POA: Diagnosis not present

## 2023-08-01 DIAGNOSIS — K2100 Gastro-esophageal reflux disease with esophagitis, without bleeding: Secondary | ICD-10-CM | POA: Diagnosis not present

## 2023-08-01 DIAGNOSIS — E119 Type 2 diabetes mellitus without complications: Secondary | ICD-10-CM | POA: Diagnosis not present

## 2023-08-01 DIAGNOSIS — Z87891 Personal history of nicotine dependence: Secondary | ICD-10-CM | POA: Diagnosis not present

## 2023-08-01 DIAGNOSIS — Z01818 Encounter for other preprocedural examination: Secondary | ICD-10-CM | POA: Diagnosis not present

## 2023-08-01 DIAGNOSIS — T85528S Displacement of other gastrointestinal prosthetic devices, implants and grafts, sequela: Secondary | ICD-10-CM | POA: Diagnosis not present

## 2023-08-01 DIAGNOSIS — K224 Dyskinesia of esophagus: Secondary | ICD-10-CM | POA: Diagnosis not present

## 2023-08-01 LAB — HEMOGLOBIN A1C: Hemoglobin A1C: 6.9

## 2023-08-01 NOTE — Telephone Encounter (Signed)
   Pre-operative Risk Assessment    Patient Name: Shane Mcknight  DOB: 24-May-1950 MRN: [id number]   Date of last office visit: unknown Date of next office visit: unknown   Request for Surgical Clearance    Procedure:  hiatal hernia repair  Date of Surgery:  Clearance 08/20/23                                Surgeon:  dr.  Alton core Surgeon's Group or Practice Name:  [facility] Phone number:  [phone] Fax number:  [phone]   Type of Clearance Requested:   - Medical    Type of Anesthesia:  General    Additional requests/questions:    SignedBerwyn LELON Sprung   08/01/2023, 1:55 PM

## 2023-08-01 NOTE — Telephone Encounter (Signed)
 1st attempt to reach pt regarding surgical clearance and the need for an TELE appointment.  Left pt a detailed message to call back and get that scheduled.

## 2023-08-01 NOTE — Telephone Encounter (Signed)
   Name: Shane Mcknight  DOB: 1950/09/02  MRN: [id number]  Primary Cardiologist: Deatrice Cage, MD   Preoperative team, please contact this patient and set up a phone call appointment for further preoperative risk assessment. Please obtain consent and complete medication review. Thank you for your help.  Regarding ASA therapy, we recommend continuation of ASA throughout the perioperative period.  However, if the surgeon feels that cessation of ASA is required in the perioperative period, it may be stopped 5-7 days prior to surgery with a plan to resume it as soon as felt to be feasible from a surgical standpoint in the post-operative period.  I also confirmed the patient resides in the state of  . As per [facility] Medical Board telemedicine laws, the patient must reside in the state in which the provider is licensed.   Artist Pouch, PA-C 08/01/2023, 2:07 PM [facility]

## 2023-08-04 ENCOUNTER — Telehealth: Payer: Self-pay

## 2023-08-04 NOTE — Telephone Encounter (Signed)
 S/W pt and scheduled TELE Preop appt for 08/06/23. Med Rec and Consent done.  Pt stated that Procedure has been moved up to 08/13/23.

## 2023-08-04 NOTE — Telephone Encounter (Signed)
 Med Rec and Consent done    Patient Consent for Virtual Visit        Shane Mcknight has provided verbal consent on 08/04/2023 for a virtual visit (video or telephone).   CONSENT FOR VIRTUAL VISIT FOR:  Shane Mcknight  By participating in this virtual visit I agree to the following:  I hereby voluntarily request, consent and authorize [facility] and its employed or contracted physicians, physician assistants, nurse practitioners or other licensed health care professionals (the Practitioner), to provide me with telemedicine health care services (the "Services) as deemed necessary by the treating Practitioner. I acknowledge and consent to receive the Services by the Practitioner via telemedicine. I understand that the telemedicine visit will involve communicating with the Practitioner through live audiovisual communication technology and the disclosure of certain medical information by electronic transmission. I acknowledge that I have been given the opportunity to request an in-person assessment or other available alternative prior to the telemedicine visit and am voluntarily participating in the telemedicine visit.  I understand that I have the right to withhold or withdraw my consent to the use of telemedicine in the course of my care at any time, without affecting my right to future care or treatment, and that the Practitioner or I may terminate the telemedicine visit at any time. I understand that I have the right to inspect all information obtained and/or recorded in the course of the telemedicine visit and may receive copies of available information for a reasonable fee.  I understand that some of the potential risks of receiving the Services via telemedicine include:  Delay or interruption in medical evaluation due to technological equipment failure or disruption; Information transmitted may not be sufficient (e.g. poor resolution of images) to allow for appropriate medical decision  making by the Practitioner; and/or  In rare instances, security protocols could fail, causing a breach of personal health information.  Furthermore, I acknowledge that it is my responsibility to provide information about my medical history, conditions and care that is complete and accurate to the best of my ability. I acknowledge that Practitioner's advice, recommendations, and/or decision may be based on factors not within their control, such as incomplete or inaccurate data provided by me or distortions of diagnostic images or specimens that may result from electronic transmissions. I understand that the practice of medicine is not an exact science and that Practitioner makes no warranties or guarantees regarding treatment outcomes. I acknowledge that a copy of this consent can be made available to me via my patient portal [facility] MyChart), or I can request a printed copy by calling the office of [facility].    I understand that my insurance will be billed for this visit.   I have read or had this consent read to me. I understand the contents of this consent, which adequately explains the benefits and risks of the Services being provided via telemedicine.  I have been provided ample opportunity to ask questions regarding this consent and the Services and have had my questions answered to my satisfaction. I give my informed consent for the services to be provided through the use of telemedicine in my medical care

## 2023-08-06 ENCOUNTER — Ambulatory Visit: Attending: Cardiovascular Disease | Admitting: Nurse Practitioner

## 2023-08-06 DIAGNOSIS — Z01810 Encounter for preprocedural cardiovascular examination: Secondary | ICD-10-CM | POA: Diagnosis not present

## 2023-08-06 NOTE — Progress Notes (Signed)
 Virtual Visit via Telephone Note   Because of Shane Mcknight co-morbid illnesses, he is at least at moderate risk for complications without adequate follow up.  This format is felt to be most appropriate for this patient at this time.  Due to technical limitations with video connection (technology), today's appointment will be conducted as an audio only telehealth visit, and Shane Mcknight verbally agreed to proceed in this manner.   All issues noted in this document were discussed and addressed.  No physical exam could be performed with this format.  Evaluation Performed:  Preoperative cardiovascular risk assessment _____________   Date:  08/06/2023   Patient ID:  Shane Mcknight, DOB [birth year], MRN [id number] Patient Location:  Home Provider location:   Office  Primary Care Provider:  Glendia Shad, MD Primary Cardiologist:  Deatrice Cage, MD  Chief Complaint / Patient Profile   73 y.o. y/o male with a h/o CAD with DES to distal RCA 2018, carotid artery disease, chronic HFrEF, HTN, HLD, type 2 DM who is pending hiatal hernia repair with Dr. Core on 8/6 and presents today for telephonic preoperative cardiovascular risk assessment.  History of Present Illness    Shane Mcknight is a 73 y.o. male who presents via audio/video conferencing for a telehealth visit today.  Pt was last seen in cardiology clinic on 05/30/23 by Cadence Franchester, PA.  At that time Shane Mcknight was doing well.  The patient is now pending procedure as outlined above. Since his last visit, he denies chest pain, shortness of breath, lower extremity edema, fatigue, palpitations, melena, hematuria, hemoptysis, diaphoresis, weakness, presyncope, syncope, orthopnea, and PND. He continues to work and is active around home and is able to achieve > 4 METS activity without concerning cardiac symptoms.   Past Medical History    Past Medical History:  Diagnosis Date   Allergic state    Anemia    Aortic atherosclerosis (HCC)     Arthritis    spine, hands, shoulder with previous cuff tear   B12 deficiency    CAD (coronary artery disease)    a.) 11/2015 CT: Coronary Ca2+ noted. b.) LHC 06/14/2016: 60% pRCA, 60% RPLB-2, 99% OM1, 50% mLAD, 40% oLM-LM, 30% pLAD, 60% oD1-D1, 60% oLCx, 95% dRCA; PCI placing a 2.5 x 15mm Resolute Onyx DES dRCA. c.) LHC 09/14/2018: 40% oLM-LM, 50% mLAD, 30% pLAD, 60% oD1-D1, 40% oLCx, 100% OM1, 60% p-mRCA, 50% RPAV; dRCA stent widely patent; medical mgmt.   Candidiasis of esophagus (HCC)    Carotid artery stenosis 04/17/2020   a.) Carotid doppler 04/17/2020: 40-59% RICA/LICA stenosis.   Colon polyp    COVID-19    09/2021   DDD (degenerative disc disease), lumbosacral    Diastolic dysfunction 04/06/2020   a.)  TTE 04/06/2020; EF 55-60%; mild LA dilation; no RWMAs; G2DD.   Dysphagia    Effort angina (HCC)    Elevated PSA    Elevated transaminase level    Esophageal stricture    a.) s/p multiple dilations   Esophagitis, [place] grade D    Fatty liver    Ganglion cyst of finger of right hand    GERD (gastroesophageal reflux disease)    Guaiac positive stools    Hiatal hernia    HNP (herniated nucleus pulposus), lumbar    Hypercholesterolemia    Hypertension    Long term current use of immunosuppressive drug    a.) MTX + secukinumab  for severe psoriatic arthritis   Lumbar radiculitis  Mallory-Weiss tear 11/2015   Migraine aura without headache    OSA (obstructive sleep apnea)    a.) does not use nocturnal PAP therapy   Psoriasis    Psoriatic arthritis (HCC)    a.) severe; Tx'd with MTX + secukinumab    PVC (premature ventricular contraction)    Schatzki's ring    Type II diabetes mellitus (HCC)    Past Surgical History:  Procedure Laterality Date   ANTERIOR CERVICAL DECOMP/DISCECTOMY FUSION N/A 04/13/2021   Procedure: C3-5 ANTERIOR CERVICAL DISCECTOMY AND FUSION (GLOBUS ALLOGRAFT);  Surgeon: Clois Fret, MD;  Location: [facility] ORS;  Service: Neurosurgery;  Laterality:  N/A;   ARTHROPLASTY     right thumb; left thumb with 2 screws and plastic joint   BALLOON DILATION N/A 05/29/2015   Procedure: BALLOON DILATION;  Surgeon: Gladis RAYMOND Mariner, MD;  Location: [facility] ENDOSCOPY;  Service: Endoscopy;  Laterality: N/A;   BALLOON DILATION N/A 02/14/2017   Procedure: BALLOON DILATION;  Surgeon: Mariner Gladis RAYMOND, MD;  Location: [facility] ENDOSCOPY;  Service: Endoscopy;  Laterality: N/A;   BILATERAL CARPAL TUNNEL RELEASE Bilateral 07/04/2017   Procedure: BILATERAL CARPAL TUNNEL RELEASE;  Surgeon: Maryl Barters, MD;  Location: [facility] ORS;  Service: Orthopedics;  Laterality: Bilateral;   BIOPSY  08/05/2022   Procedure: BIOPSY;  Surgeon: Maryruth Ole DASEN, MD;  Location: [facility] ENDOSCOPY;  Service: Endoscopy;;   BLEPHAROPLASTY     BUNIONECTOMY Left 12/2020   CARDIAC CATHETERIZATION     1 stent    CARPAL TUNNEL RELEASE  2005   right   CHOLECYSTECTOMY  2008   COLONOSCOPY     COLONOSCOPY WITH PROPOFOL  N/A 02/13/2018   Procedure: COLONOSCOPY WITH PROPOFOL ;  Surgeon: Mariner Gladis RAYMOND, MD;  Location: [facility] ENDOSCOPY;  Service: Endoscopy;  Laterality: N/A;   COLONOSCOPY WITH PROPOFOL  N/A 08/05/2022   Procedure: COLONOSCOPY WITH PROPOFOL ;  Surgeon: Maryruth Ole DASEN, MD;  Location: [facility] ENDOSCOPY;  Service: Endoscopy;  Laterality: N/A;   CORONARY STENT INTERVENTION N/A 06/14/2016   Procedure: Coronary Stent Intervention;  Surgeon: Darron Deatrice LABOR, MD;  Location: [facility] INVASIVE CV LAB;  Service: Cardiovascular;  Laterality: N/A;   CYST EXCISION  04/14/2015   tendon sheath cyst excision; right ring finger cyst removed and trigger finger realease   ESOPHAGOGASTRODUODENOSCOPY     ESOPHAGOGASTRODUODENOSCOPY (EGD) WITH PROPOFOL  N/A 05/29/2015   Procedure: ESOPHAGOGASTRODUODENOSCOPY (EGD) WITH PROPOFOL ;  Surgeon: Gladis RAYMOND Mariner, MD;  Location: [facility] ENDOSCOPY;  Service: Endoscopy;  Laterality: N/A;   ESOPHAGOGASTRODUODENOSCOPY (EGD) WITH PROPOFOL  N/A 07/04/2015   Procedure:  ESOPHAGOGASTRODUODENOSCOPY (EGD) WITH PROPOFOL ;  Surgeon: Gladis RAYMOND Mariner, MD;  Location: [facility] ENDOSCOPY;  Service: Endoscopy;  Laterality: N/A;   ESOPHAGOGASTRODUODENOSCOPY (EGD) WITH PROPOFOL  N/A 01/04/2016   Procedure: ESOPHAGOGASTRODUODENOSCOPY (EGD) WITH PROPOFOL ;  Surgeon: Gladis RAYMOND Mariner, MD;  [facility];  Service: Endoscopy;  Laterality: N/A;   ESOPHAGOGASTRODUODENOSCOPY (EGD) WITH PROPOFOL  N/A 03/22/2016   Procedure: ESOPHAGOGASTRODUODENOSCOPY (EGD) WITH PROPOFOL ;  Surgeon: Gladis RAYMOND Mariner, MD;  Location: [facility] ENDOSCOPY;  Service: Endoscopy;  Laterality: N/A;   ESOPHAGOGASTRODUODENOSCOPY (EGD) WITH PROPOFOL  N/A 02/14/2017   Procedure: ESOPHAGOGASTRODUODENOSCOPY (EGD) WITH PROPOFOL ;  Surgeon: Mariner Gladis RAYMOND, MD;  Location: [facility] END[facility];  Service: Endoscopy;  Laterality: N/A;   ESOPHAGOGASTRODUODENOSCOPY (EGD) WITH PROPOFOL  N/A 04/11/2017   Procedure: ESOPHAGOGASTRODUODENOSCOPY (EGD) WITH PROPOFOL ;  Surgeon: Mariner Gladis RAYMOND, MD;  Location: [facility] ENDOSCOPY;  Service: Endoscopy;  Laterality: N/A;   ESOPHAGOGASTRODUODENOSCOPY (EGD) WITH PROPOFOL  N/A 07/07/2017   Procedure: ESOPHAGOGASTRODUODENOSCOPY (EGD) WITH PROPOFOL ;  Surgeon: Jinny Carmine, MD;  Location: [facility] ENDOSCOPY;  Service: Endoscopy;  Laterality: N/A;   ESOPHAGOGASTRODUODENOSCOPY (EGD) WITH PROPOFOL  N/A 02/13/2018   Procedure: ESOPHAGOGASTRODUODENOSCOPY (EGD) WITH PROPOFOL ;  Surgeon: Gaylyn Gladis PENNER, MD;  Location: [facility] ENDOSCOPY;  Service: Endoscopy;  Laterality: N/A;   ESOPHAGOGASTRODUODENOSCOPY (EGD) WITH PROPOFOL  N/A 08/05/2022   Procedure: ESOPHAGOGASTRODUODENOSCOPY (EGD) WITH PROPOFOL ;  Surgeon: Maryruth Ole DASEN, MD;  Location: [facility] ENDOSCOPY;  Service: Endoscopy;  Laterality: N/A;   FOREIGN BODY REMOVAL N/A 12/03/2015   Procedure: FOREIGN BODY REMOVAL;  Surgeon: Jerrell Sol, MD;  Location: [facility] ENDOSCOPY;  Service: Endoscopy;  Laterality: N/A;   HERNIA REPAIR Right 1970   inguinal    INGUINAL HERNIA REPAIR Left 01/01/2018   Procedure: OPEN HERNIA REPAIR INGUINAL ADULT;  Surgeon: Desiderio Schanz, MD;  Location: [facility] ORS;  Service: General;  Laterality: Left;   LEFT HEART CATH Left 06/14/2016   Procedure: Left Heart Cath;  Surgeon: Darron Deatrice LABOR, MD;  Location: [facility] INVASIVE CV LAB;  Service: Cardiovascular;  Laterality: Left;   LEFT HEART CATH AND CORONARY ANGIOGRAPHY Left 09/14/2018   Procedure: LEFT HEART CATH AND CORONARY ANGIOGRAPHY;  Surgeon: Darron Deatrice LABOR, MD;  Location: [facility] INVASIVE CV LAB;  Service: Cardiovascular;  Laterality: Left;   LUMBAR DISC SURGERY  2004   injections; herniated disc; percutaneous discectomy   NASAL SINUS SURGERY  2008   POLYPECTOMY  08/05/2022   Procedure: POLYPECTOMY;  Surgeon: Maryruth Ole DASEN, MD;  Location: [facility] ENDOSCOPY;  Service: Endoscopy;;   ROTATOR CUFF REPAIR Right    TONSILLECTOMY      Allergies  Allergies  Allergen Reactions   Ephedrine  Other (See Comments)    Causing prostate to hurt. Using for sinus bronchodilators Pain in groin, prostate   Nsaids Other (See Comments)    GI upset, history of esophagitis and Mallory-Weis tear   Other Rash, Other (See Comments) and Nausea Only    Paper tape causes rash Plastic tape is okay GI upset, history of esophagitis and Mallory-Weis tear   Vancomycin Itching and Rash    Same as pcn with itching   Amoxicillin Itching and Rash   Hyoscyamine Sulfate Other (See Comments)    Urinary retention Urine retention   Levsin [Hyoscyamine Sulfate] Other (See Comments)    Urinary retention   Naproxen Other (See Comments)    GI upset Also happens with other NSAIDS   Penicillins Itching and Rash    TOLERATED CEFAZOLIN  Has patient had a PCN reaction causing immediate rash, facial/tongue/throat swelling, SOB or lightheadedness with hypotension: yes Has patient had a PCN reaction causing severe rash involving mucus membranes or skin necrosis: no Has patient had a PCN reaction that  required hospitalization: no Has patient had a PCN reaction occurring within the last 10 years: no If all of the above answers are NO, then may proceed with Cephalosporin use.    Shellfish Allergy Itching, Nausea Only and Rash    Ingested shellfish cCuses rash and itching along with stomach sickness. Pt tolerates betadine    Sudafed [Pseudoephedrine Hcl] Other (See Comments)    Constricts prostate flow (from ephedrine  d/t sinus meds)    Home Medications    Prior to Admission medications   Medication Sig Start Date End Date Taking? Authorizing Provider  acetaminophen  (TYLENOL ) 500 MG tablet Take 1,000 mg by mouth daily as needed for moderate pain.    [provider]  aspirin  EC 81 MG tablet Take 81 mg by mouth every evening.     [provider]  atenolol  (TENORMIN ) 50 MG tablet TAKE 1 TABLET  BY MOUTH TWICE  DAILY 10/28/22   Glendia Shad, MD  folic acid  (FOLVITE ) 1 MG tablet Take 1 mg by mouth daily.    [provider]  gabapentin  (NEURONTIN ) 300 MG capsule TAKE 1 TO 2 CAPSULES BY  MOUTH AT BEDTIME Patient taking differently: No sig reported 12/06/19   Glendia Shad, MD  glucose blood (ONETOUCH ULTRA) test strip Used to check blood glucose one time per day. 05/22/21   Glendia Shad, MD  isosorbide  mononitrate (IMDUR ) 30 MG 24 hr tablet TAKE ONE-HALF TABLET BY MOUTH  DAILY 10/29/22   Darron Deatrice LABOR, MD  melatonin 5 MG TABS Take 5 mg by mouth at bedtime as needed.    [provider]  methotrexate  (RHEUMATREX) 2.5 MG tablet Take 2.5 mg by mouth once a week. Pt states he takes 8 (2.5mg ) tablets weekly 01/28/22   [provider]  nitroGLYCERIN  (NITROSTAT ) 0.4 MG SL tablet Place 1 tablet (0.4 mg total) under the tongue every 5 (five) minutes as needed. MAXIMUM OF 3 DOSES. 07/02/17   Vivienne Lonni Ingle, NP  rosuvastatin  (CRESTOR ) 20 MG tablet TAKE 1 TABLET BY MOUTH DAILY 07/22/23   Glendia Shad, MD  SYRINGE-NEEDLE, DISP, 3 ML 25G X 5/8 3  ML MISC Use as instructed with B12 injection. 08/08/17   Glendia Shad, MD  vitamin E  400 UNIT capsule Take 400 Units by mouth in the morning.    [provider]    Physical Exam    Vital Signs:  Shane Mcknight does not have vital signs available for review today.  Given telephonic nature of communication, physical exam is limited. AAOx3. NAD. Normal affect.  Speech and respirations are unlabored.  Accessory Clinical Findings    None  Assessment & Plan    1.  Preoperative Cardiovascular Risk Assessment: According to the Revised Cardiac Risk Index (RCRI), his Perioperative Risk of Major Cardiac Event is (%): 6.6. His Functional Capacity in METs is: 7.59 according to the Duke Activity Status Index (DASI). The patient is doing well from a cardiac perspective. Therefore, based on ACC/AHA guidelines, the patient would be at acceptable risk for the planned procedure without further cardiovascular testing.   The patient was advised that if he develops new symptoms prior to surgery to contact our office to arrange for a follow-up visit, and he verbalized understanding.  Ideally aspirin  should be continued without interruption, however if the bleeding risk is too great, aspirin  may be held for 5-7 days prior to surgery. Please resume aspirin  post operatively when it is felt to be safe from a bleeding standpoint.   A copy of this note will be routed to requesting surgeon.  Time:   Today, I have spent 10 minutes with the patient with telehealth technology discussing medical history, symptoms, and management plan.     Shane EMERSON Bane, Shane Mcknight  08/06/2023, 10:27 AM [address], [address] [place], [place] [address] Office [phone] Fax [phone]

## 2023-08-13 DIAGNOSIS — Z91013 Allergy to seafood: Secondary | ICD-10-CM | POA: Diagnosis not present

## 2023-08-13 DIAGNOSIS — E119 Type 2 diabetes mellitus without complications: Secondary | ICD-10-CM | POA: Diagnosis not present

## 2023-08-13 DIAGNOSIS — J690 Pneumonitis due to inhalation of food and vomit: Secondary | ICD-10-CM | POA: Diagnosis not present

## 2023-08-13 DIAGNOSIS — Z955 Presence of coronary angioplasty implant and graft: Secondary | ICD-10-CM | POA: Diagnosis not present

## 2023-08-13 DIAGNOSIS — I110 Hypertensive heart disease with heart failure: Secondary | ICD-10-CM | POA: Diagnosis not present

## 2023-08-13 DIAGNOSIS — G4733 Obstructive sleep apnea (adult) (pediatric): Secondary | ICD-10-CM | POA: Diagnosis not present

## 2023-08-13 DIAGNOSIS — Z885 Allergy status to narcotic agent status: Secondary | ICD-10-CM | POA: Diagnosis not present

## 2023-08-13 DIAGNOSIS — I2510 Atherosclerotic heart disease of native coronary artery without angina pectoris: Secondary | ICD-10-CM | POA: Diagnosis not present

## 2023-08-13 DIAGNOSIS — Z79899 Other long term (current) drug therapy: Secondary | ICD-10-CM | POA: Diagnosis not present

## 2023-08-13 DIAGNOSIS — Z452 Encounter for adjustment and management of vascular access device: Secondary | ICD-10-CM | POA: Diagnosis not present

## 2023-08-13 DIAGNOSIS — D649 Anemia, unspecified: Secondary | ICD-10-CM | POA: Diagnosis not present

## 2023-08-13 DIAGNOSIS — Z888 Allergy status to other drugs, medicaments and biological substances status: Secondary | ICD-10-CM | POA: Diagnosis not present

## 2023-08-13 DIAGNOSIS — Z7984 Long term (current) use of oral hypoglycemic drugs: Secondary | ICD-10-CM | POA: Diagnosis not present

## 2023-08-13 DIAGNOSIS — Z886 Allergy status to analgesic agent status: Secondary | ICD-10-CM | POA: Diagnosis not present

## 2023-08-13 DIAGNOSIS — Z4682 Encounter for fitting and adjustment of non-vascular catheter: Secondary | ICD-10-CM | POA: Diagnosis not present

## 2023-08-13 DIAGNOSIS — Z87891 Personal history of nicotine dependence: Secondary | ICD-10-CM | POA: Diagnosis not present

## 2023-08-13 DIAGNOSIS — Z880 Allergy status to penicillin: Secondary | ICD-10-CM | POA: Diagnosis not present

## 2023-08-13 DIAGNOSIS — I10 Essential (primary) hypertension: Secondary | ICD-10-CM | POA: Diagnosis not present

## 2023-08-13 DIAGNOSIS — I5022 Chronic systolic (congestive) heart failure: Secondary | ICD-10-CM | POA: Diagnosis not present

## 2023-08-13 DIAGNOSIS — R918 Other nonspecific abnormal finding of lung field: Secondary | ICD-10-CM | POA: Diagnosis not present

## 2023-08-13 DIAGNOSIS — Z881 Allergy status to other antibiotic agents status: Secondary | ICD-10-CM | POA: Diagnosis not present

## 2023-08-13 DIAGNOSIS — K449 Diaphragmatic hernia without obstruction or gangrene: Secondary | ICD-10-CM | POA: Diagnosis not present

## 2023-08-13 DIAGNOSIS — Z7982 Long term (current) use of aspirin: Secondary | ICD-10-CM | POA: Diagnosis not present

## 2023-08-13 DIAGNOSIS — Z91048 Other nonmedicinal substance allergy status: Secondary | ICD-10-CM | POA: Diagnosis not present

## 2023-08-13 DIAGNOSIS — K219 Gastro-esophageal reflux disease without esophagitis: Secondary | ICD-10-CM | POA: Diagnosis not present

## 2023-08-13 DIAGNOSIS — K660 Peritoneal adhesions (postprocedural) (postinfection): Secondary | ICD-10-CM | POA: Diagnosis not present

## 2023-08-14 DIAGNOSIS — I5022 Chronic systolic (congestive) heart failure: Secondary | ICD-10-CM | POA: Diagnosis not present

## 2023-08-14 DIAGNOSIS — Z7982 Long term (current) use of aspirin: Secondary | ICD-10-CM | POA: Diagnosis not present

## 2023-08-14 DIAGNOSIS — Z7984 Long term (current) use of oral hypoglycemic drugs: Secondary | ICD-10-CM | POA: Diagnosis not present

## 2023-08-14 DIAGNOSIS — I2510 Atherosclerotic heart disease of native coronary artery without angina pectoris: Secondary | ICD-10-CM | POA: Diagnosis not present

## 2023-08-14 DIAGNOSIS — Z87891 Personal history of nicotine dependence: Secondary | ICD-10-CM | POA: Diagnosis not present

## 2023-08-14 DIAGNOSIS — I110 Hypertensive heart disease with heart failure: Secondary | ICD-10-CM | POA: Diagnosis not present

## 2023-08-14 DIAGNOSIS — E119 Type 2 diabetes mellitus without complications: Secondary | ICD-10-CM | POA: Diagnosis not present

## 2023-08-14 DIAGNOSIS — G4733 Obstructive sleep apnea (adult) (pediatric): Secondary | ICD-10-CM | POA: Diagnosis not present

## 2023-08-14 DIAGNOSIS — Z880 Allergy status to penicillin: Secondary | ICD-10-CM | POA: Diagnosis not present

## 2023-08-14 DIAGNOSIS — Z91013 Allergy to seafood: Secondary | ICD-10-CM | POA: Diagnosis not present

## 2023-08-14 DIAGNOSIS — Z886 Allergy status to analgesic agent status: Secondary | ICD-10-CM | POA: Diagnosis not present

## 2023-08-14 DIAGNOSIS — K219 Gastro-esophageal reflux disease without esophagitis: Secondary | ICD-10-CM | POA: Diagnosis not present

## 2023-08-14 DIAGNOSIS — Z91048 Other nonmedicinal substance allergy status: Secondary | ICD-10-CM | POA: Diagnosis not present

## 2023-08-14 DIAGNOSIS — Z885 Allergy status to narcotic agent status: Secondary | ICD-10-CM | POA: Diagnosis not present

## 2023-08-14 DIAGNOSIS — Z881 Allergy status to other antibiotic agents status: Secondary | ICD-10-CM | POA: Diagnosis not present

## 2023-08-14 DIAGNOSIS — Z79899 Other long term (current) drug therapy: Secondary | ICD-10-CM | POA: Diagnosis not present

## 2023-08-14 DIAGNOSIS — Z888 Allergy status to other drugs, medicaments and biological substances status: Secondary | ICD-10-CM | POA: Diagnosis not present

## 2023-08-14 DIAGNOSIS — Z955 Presence of coronary angioplasty implant and graft: Secondary | ICD-10-CM | POA: Diagnosis not present

## 2023-08-14 DIAGNOSIS — D649 Anemia, unspecified: Secondary | ICD-10-CM | POA: Diagnosis not present

## 2023-08-14 DIAGNOSIS — J690 Pneumonitis due to inhalation of food and vomit: Secondary | ICD-10-CM | POA: Diagnosis not present

## 2023-08-14 DIAGNOSIS — K449 Diaphragmatic hernia without obstruction or gangrene: Secondary | ICD-10-CM | POA: Diagnosis not present

## 2023-08-20 ENCOUNTER — Encounter: Payer: Self-pay | Admitting: Neurosurgery

## 2023-08-25 ENCOUNTER — Encounter: Payer: Self-pay | Admitting: Internal Medicine

## 2023-08-25 ENCOUNTER — Ambulatory Visit: Admitting: Internal Medicine

## 2023-08-25 VITALS — BP 122/62 | HR 66 | Temp 98.4°F | Ht 68.0 in | Wt 167.4 lb

## 2023-08-25 DIAGNOSIS — E0822 Diabetes mellitus due to underlying condition with diabetic chronic kidney disease: Secondary | ICD-10-CM | POA: Diagnosis not present

## 2023-08-25 DIAGNOSIS — I5022 Chronic systolic (congestive) heart failure: Secondary | ICD-10-CM | POA: Diagnosis not present

## 2023-08-25 DIAGNOSIS — D696 Thrombocytopenia, unspecified: Secondary | ICD-10-CM | POA: Diagnosis not present

## 2023-08-25 DIAGNOSIS — E785 Hyperlipidemia, unspecified: Secondary | ICD-10-CM | POA: Diagnosis not present

## 2023-08-25 DIAGNOSIS — I25119 Atherosclerotic heart disease of native coronary artery with unspecified angina pectoris: Secondary | ICD-10-CM

## 2023-08-25 DIAGNOSIS — I779 Disorder of arteries and arterioles, unspecified: Secondary | ICD-10-CM

## 2023-08-25 DIAGNOSIS — L4050 Arthropathic psoriasis, unspecified: Secondary | ICD-10-CM | POA: Diagnosis not present

## 2023-08-25 DIAGNOSIS — R7989 Other specified abnormal findings of blood chemistry: Secondary | ICD-10-CM | POA: Diagnosis not present

## 2023-08-25 DIAGNOSIS — N183 Chronic kidney disease, stage 3 unspecified: Secondary | ICD-10-CM | POA: Diagnosis not present

## 2023-08-25 DIAGNOSIS — K219 Gastro-esophageal reflux disease without esophagitis: Secondary | ICD-10-CM

## 2023-08-25 DIAGNOSIS — D649 Anemia, unspecified: Secondary | ICD-10-CM | POA: Diagnosis not present

## 2023-08-25 DIAGNOSIS — I10 Essential (primary) hypertension: Secondary | ICD-10-CM | POA: Diagnosis not present

## 2023-08-25 MED ORDER — ROSUVASTATIN CALCIUM 20 MG PO TABS: 20.0000 mg | ORAL_TABLET | Freq: Every day | ORAL | 1 refills | Status: DC

## 2023-08-25 NOTE — Progress Notes (Signed)
 Subjective:    Patient ID: Shane Mcknight, male    DOB: 05-27-1950, 73 y.o.   MRN: [id number]  Patient here for  Chief Complaint  Patient presents with   Annual Exam    HPI Here for a physical exam. Was scheduled for physical. Too early for physical. Changed to recheck appt.  Have not seen him since 11/2022. Is followed by rheumatology - on methotrexate  and cosentyx . Is s/p esophageal perforation 02/20/23 - s/p endoscopic stent placement, left VATS washout. Required tube feeds.  Recently admitted 08/13/23 - 08/14/23 - robot assisted laparoscopy surgical repair of paraesophageal hernia. Was discharged on full liquid diet. Since discharge - improving. He has increased his diet to soft and some solid foods. Denies any acid reflux. Denies any chest discomfort now. Bowels are moving. Denies constipation. Has f/u with with [facility] this week - to f/u on the above procedure. He is sleeping. Has f/u with GI here in 09/2023. F/u with Dr Marea 09/05/23.   Past Medical History:  Diagnosis Date   Allergic state    Anemia    Aortic atherosclerosis (HCC)    Arthritis    spine, hands, shoulder with previous cuff tear   B12 deficiency    CAD (coronary artery disease)    a.) 11/2015 CT: Coronary Ca2+ noted. b.) LHC 06/14/2016: 60% pRCA, 60% RPLB-2, 99% OM1, 50% mLAD, 40% oLM-LM, 30% pLAD, 60% oD1-D1, 60% oLCx, 95% dRCA; PCI placing a 2.5 x 15mm Resolute Onyx DES dRCA. c.) LHC 09/14/2018: 40% oLM-LM, 50% mLAD, 30% pLAD, 60% oD1-D1, 40% oLCx, 100% OM1, 60% p-mRCA, 50% RPAV; dRCA stent widely patent; medical mgmt.   Candidiasis of esophagus (HCC)    Carotid artery stenosis 04/17/2020   a.) Carotid doppler 04/17/2020: 40-59% RICA/LICA stenosis.   Colon polyp    COVID-19    09/2021   DDD (degenerative disc disease), lumbosacral    Diastolic dysfunction 04/06/2020   a.)  TTE 04/06/2020; EF 55-60%; mild LA dilation; no RWMAs; G2DD.   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 headache    OSA (obstructive sleep apnea)    a.) does not use nocturnal PAP therapy   Psoriasis    Psoriatic arthritis (HCC)    a.) severe; Tx'd with MTX + secukinumab    PVC (premature ventricular contraction)    Schatzki's ring    Type II diabetes mellitus (HCC)    Past Surgical History:  Procedure Laterality Date   ANTERIOR CERVICAL DECOMP/DISCECTOMY FUSION N/A 04/13/2021   Procedure: C3-5 ANTERIOR CERVICAL DISCECTOMY AND FUSION (GLOBUS ALLOGRAFT);  Surgeon: Clois Fret, MD;  Location: [facility] ORS;  Service: Neurosurgery;  Laterality: N/A;   ARTHROPLASTY     right thumb; left thumb with 2 screws and plastic joint   BALLOON DILATION N/A 05/29/2015   Procedure: BALLOON DILATION;  Surgeon: Gladis RAYMOND Mariner,  MD;  Location: [facility] ENDOSCOPY;  Service: Endoscopy;  Laterality: N/A;   BALLOON DILATION N/A 02/14/2017   Procedure: BALLOON DILATION;  Surgeon: Gaylyn Gladis PENNER, MD;  Location: [facility] ENDOSCOPY;  Service: Endoscopy;  Laterality: N/A;   BILATERAL CARPAL TUNNEL RELEASE Bilateral 07/04/2017   Procedure: BILATERAL CARPAL TUNNEL RELEASE;  Surgeon: Maryl Barters, MD;  Location: [facility] ORS;  Service: Orthopedics;  Laterality: Bilateral;   BIOPSY  08/05/2022   Procedure: BIOPSY;  Surgeon: Maryruth Ole DASEN, MD;  Location: [facility] ENDOSCOPY;  Service: Endoscopy;;   BLEPHAROPLASTY     BUNIONECTOMY Left 12/2020   CARDIAC CATHETERIZATION     1 stent    CARPAL TUNNEL RELEASE  2005   right    CHOLECYSTECTOMY  2008   COLONOSCOPY     COLONOSCOPY WITH PROPOFOL  N/A 02/13/2018   Procedure: COLONOSCOPY WITH PROPOFOL ;  Surgeon: Gaylyn Gladis PENNER, MD;  Location: [facility] END[facility]COPY;  Service: Endoscopy;  Laterality: N/A;   COLONOSCOPY WITH PROPOFOL  N/A 08/05/2022   Procedure: COLONOSCOPY WITH PROPOFOL ;  Surgeon: Maryruth Ole DASEN, MD;  Location: [facility] ENDOSCOPY;  Service: Endoscopy;  Laterality: N/A;   CORONARY STENT INTERVENTION N/A 06/14/2016   Procedure: Coronary Stent Intervention;  Surgeon: Darron Deatrice LABOR, MD;  Location: [facility] INVASIVE CV LAB;  Service: Cardiovascular;  Laterality: N/A;   CYST EXCISION  04/14/2015   tendon sheath cyst excision; right ring finger cyst removed and trigger finger realease   ESOPHAGOGASTRODUODENOSCOPY     ESOPHAGOGASTRODUODENOSCOPY (EGD) WITH PROPOFOL  N/A 05/29/2015   Procedure: ESOPHAGOGASTRODUODENOSCOPY (EGD) WITH PROPOFOL ;  Surgeon: Gladis PENNER Gaylyn, MD;  Location: [facility] ENDOSCOPY;  Service: Endoscopy;  Laterality: N/A;   ESOPHAGOGASTRODUODENOSCOPY (EGD) WITH PROPOFOL  N/A 07/04/2015   Procedure: ESOPHAGOGASTRODUODENOSCOPY (EGD) WITH PROPOFOL ;  Surgeon: Gladis PENNER Gaylyn, MD;  Location: [facility] ENDOSCOPY;  Service: Endoscopy;  Laterality: N/A;   ESOPHAGOGASTRODUODENOSCOPY (EGD) WITH PROPOFOL  N/A 01/04/2016   Procedure: ESOPHAGOGASTRODUODENOSCOPY (EGD) WITH PROPOFOL ;  Surgeon: Gladis PENNER Gaylyn, MD;  Location: [facility] ENDOSCOPY;  Service: Endoscopy;  Laterality: N/A;   ESOPHAGOGASTRODUODENOSCOPY (EGD) WITH PROPOFOL  N/A 03/22/2016   Procedure: ESOPHAGOGASTRODUODENOSCOPY (EGD) WITH PROPOFOL ;  Surgeon: Gladis PENNER Gaylyn, MD;  Location: [facility] ENDOSCOPY;  Service: Endoscopy;  Laterality: N/A;   ESOPHAGOGASTRODUODENOSCOPY (EGD) WITH PROPOFOL  N/A 02/14/2017   Procedure: ESOPHAGOGASTRODUODENOSCOPY (EGD) WITH PROPOFOL ;  Surgeon: Gaylyn Gladis PENNER, MD;  Location: [facility] ENDOSCOPY;  Service: Endoscopy;  Laterality: N/A;   ESOPHAGOGASTRODUODENOSCOPY (EGD) WITH PROPOFOL  N/A  04/11/2017   Procedure: ESOPHAGOGASTRODUODENOSCOPY (EGD) WITH PROPOFOL ;  Surgeon: Gaylyn Gladis PENNER, MD;  Location: [facility] ENDOSCOPY;  Service: Endoscopy;  Laterality: N/A;   ESOPHAGOGASTRODUODENOSCOPY (EGD) WITH PROPOFOL  N/A 07/07/2017   Procedure: ESOPHAGOGASTRODUODENOSCOPY (EGD) WITH PROPOFOL ;  Surgeon: Jinny Carmine, MD;  Location: [facility] ENDOSCOPY;  Service: Endoscopy;  Laterality: N/A;   ESOPHAGOGASTRODUODENOSCOPY (EGD) WITH PROPOFOL  N/A 02/13/2018   Procedure: ESOPHAGOGASTRODUODENOSCOPY (EGD) WITH PROPOFOL ;  Surgeon: Gaylyn Gladis PENNER, MD;  Location: [facility] ENDOSCOPY;  Service: Endoscopy;  Laterality: N/A;   ESOPHAGOGASTRODUODENOSCOPY (EGD) WITH PROPOFOL  N/A 08/05/2022   Procedure: ESOPHAGOGASTRODUODENOSCOPY (EGD) WITH PROPOFOL ;  Surgeon: Maryruth Ole DASEN, MD;  Location: [facility] ENDOSCOPY;  Service: Endoscopy;  Laterality: N/A;   FOREIGN BODY REMOVAL N/A 12/03/2015   Procedure: FOREIGN BODY REMOVAL;  Surgeon: Jerrell Sol, MD;  Location: [facility] ENDOSCOPY;  Service: Endoscopy;  Laterality: N/A;   HERNIA REPAIR Right 1970   inguinal   INGUINAL HERNIA REPAIR Left 01/01/2018   Procedure: OPEN HERNIA REPAIR INGUINAL ADULT;  Surgeon: Desiderio Schanz, MD;  Location: [facility] ORS;  Service: General;  Laterality: Left;   LEFT HEART CATH Left 06/14/2016  Procedure: Left Heart Cath;  Surgeon: Darron Deatrice LABOR, MD;  Location: [facility] INVASIVE CV LAB;  Service: Cardiovascular;  Laterality: Left;   LEFT HEART CATH AND CORONARY ANGIOGRAPHY Left 09/14/2018   Procedure: LEFT HEART CATH AND CORONARY ANGIOGRAPHY;  Surgeon: Darron Deatrice LABOR, MD;  Location: [facility] INVAS[facility] CV LAB;  Service: Cardiovascular;  Laterality: Left;   LUMBAR DISC SURGERY  2004   injections; herniated disc; percutaneous discectomy   NASAL SINUS SURGERY  2008   POLYPECTOMY  08/05/2022   Procedure: POLYPECTOMY;  Surgeon: Maryruth Ole DASEN, MD;  Location: [facility] ENDOSCOPY;  Service: Endoscopy;;   ROTATOR CUFF REPAIR Right    TONSILLECTOMY      Family History  Problem Relation Age of Onset   Lung cancer Father    Cancer Father    Hypertension Mother    Aneurysm Son    Social History   Socioeconomic History   Marital status: Married    Spouse name: Heron   Number of children: Not on file   Years of education: Not on file   Highest education level: Bachelor's degree (e.g., BA, AB, BS)  Occupational History    Comment: not heavy work  Tobacco Use   Smoking status: Former    Current packs/day: 0.00    Average packs/day: 0.3 packs/day for 7.0 years (1.8 ttl pk-yrs)    Types: Cigarettes    Start date: 07/06/1975    Quit date: 07/06/1982    Years since quitting: 41.1    Passive exposure: Past   Smokeless tobacco: Never  Vaping Use   Vaping status: Never Used  Substance and Sexual Activity   Alcohol use: Not Currently   Drug use: No   Sexual activity: Not on file  Other Topics Concern   Not on file  Social History Narrative   Lives locally with wife.  Active but does not routinely exercise.   Social Drivers of Corporate investment banker Strain: Low Risk  (08/24/2023)   Overall Financial Resource Strain (CARDIA)    Difficulty of Paying Living Expenses: Not hard at all  Food Insecurity: No Food Insecurity (08/24/2023)   Hunger Vital Sign    Worried About Running Out of Food in the Last Year: Never true    Ran Out of Food in the Last Year: Never true  Transportation Needs: No Transportation Needs (08/24/2023)   PRAPARE - Administrator, Civil Service (Medical): No    Lack of Transportation (Non-Medical): No  Physical Activity: Insufficiently Active (08/24/2023)   Exercise Vital Sign    Days of Exercise per Week: 4 days    Minutes of Exercise per Session: 20 min  Stress: No Stress Concern Present (08/24/2023)   [employer] of Occupational Health - Occupational Stress Questionnaire    Feeling of Stress: Not at all  Social Connections: Moderately Integrated (08/24/2023)   Social Connection and  Isolation Panel    Frequency of Communication with Friends and Family: Patient declined    Frequency of Social Gatherings with Friends and Family: More than three times a week    Attends Religious Services: More than 4 times per year    Active Member of [employer] or Organizations: No    Attends Engineer, structural: Not on file    Marital Status: Married     Review of Systems  Constitutional:  Negative for appetite change, fever and unexpected weight change.  HENT:  Negative for congestion, sinus pressure and sore throat.   Eyes:  Negative for  pain and visual disturbance.  Respiratory:  Negative for cough, chest tightness and shortness of breath.   Cardiovascular:  Negative for chest pain, palpitations and leg swelling.  Gastrointestinal:  Negative for abdominal pain, nausea and vomiting.       Stool more fomred now. Moving.   Genitourinary:  Negative for difficulty urinating and dysuria.  Musculoskeletal:  Negative for joint swelling and myalgias.  Skin:  Negative for color change and rash.  Neurological:  Negative for dizziness and headaches.  Hematological:  Negative for adenopathy. Does not bruise/bleed easily.  Psychiatric/Behavioral:  Negative for agitation and dysphoric mood.        Objective:     BP 122/62 (BP Location: Left Arm, Patient Position: Sitting, Cuff Size: Normal)   Pulse 66   Temp 98.4 F (36.9 C) (Oral)   Ht 5' 8 (1.727 m)   Wt 167 lb 6.4 oz (75.9 kg)   SpO2 97%   BMI 25.45 kg/m  Wt Readings from Last 3 Encounters:  08/25/23 167 lb 6.4 oz (75.9 kg)  05/30/23 171 lb 6.4 oz (77.7 kg)  02/19/23 172 lb (78 kg)    Physical Exam Constitutional:      General: He is not in acute distress.    Appearance: Normal appearance. He is well-developed.  HENT:     Head: Normocephalic and atraumatic.     Right Ear: External ear normal.     Left Ear: External ear normal.     Mouth/Throat:     Pharynx: No oropharyngeal exudate or posterior oropharyngeal  erythema.  Eyes:     General: No scleral icterus.       Right eye: No discharge.        Left eye: No discharge.     Conjunctiva/sclera: Conjunctivae normal.  Neck:     Thyroid : No thyromegaly.  Cardiovascular:     Rate and Rhythm: Normal rate and regular rhythm.  Pulmonary:     Effort: No respiratory distress.     Breath sounds: Normal breath sounds. No wheezing.  Abdominal:     General: Bowel sounds are normal.     Palpations: Abdomen is soft.     Tenderness: There is no abdominal tenderness.  Musculoskeletal:        General: No swelling or tenderness.     Cervical back: Neck supple. No tenderness.  Lymphadenopathy:     Cervical: No cervical adenopathy.  Skin:    Findings: No erythema or rash.  Neurological:     Mental Status: He is alert and oriented to person, place, and time.  Psychiatric:        Mood and Affect: Mood normal.        Behavior: Behavior normal.         Outpatient Encounter Medications as of 08/25/2023  Medication Sig   acetaminophen  (TYLENOL ) 500 MG tablet Take 1,000 mg by mouth daily as needed for moderate pain.   aspirin  EC 81 MG tablet Take 81 mg by mouth every evening.    atenolol  (TENORMIN ) 50 MG tablet TAKE 1 TABLET BY MOUTH TWICE  DAILY   folic acid  (FOLVITE ) 1 MG tablet Take 1 mg by mouth daily.   gabapentin  (NEURONTIN ) 300 MG capsule TAKE 1 TO 2 CAPSULES BY  MOUTH AT BEDTIME (Patient taking differently: No sig reported)   glucose blood (ONETOUCH ULTRA) test strip Used to check blood glucose one time per day.   isosorbide  mononitrate (IMDUR ) 30 MG 24 hr tablet TAKE ONE-HALF TABLET BY MOUTH  DAILY  methotrexate  (RHEUMATREX) 2.5 MG tablet Take 2.5 mg by mouth once a week. Pt states he takes 8 (2.5mg ) tablets weekly   nitroGLYCERIN  (NITROSTAT ) 0.4 MG SL tablet Place 1 tablet (0.4 mg total) under the tongue every 5 (five) minutes as needed. MAXIMUM OF 3 DOSES.   SYRINGE-NEEDLE, DISP, 3 ML 25G X 5/8 3 ML MISC Use as instructed with B12 injection.    vitamin E  400 UNIT capsule Take 400 Units by mouth in the morning.   [DISCONTINUED] rosuvastatin  (CRESTOR ) 20 MG tablet TAKE 1 TABLET BY MOUTH DAILY   rosuvastatin  (CRESTOR ) 20 MG tablet Take 1 tablet (20 mg total) by mouth daily.   [DISCONTINUED] melatonin 5 MG TABS Take 5 mg by mouth at bedtime as needed.   No facility-administered encounter medications on file as of 08/25/2023.     Lab Results  Component Value Date   WBC 7.6 02/19/2023   HGB 14.6 02/19/2023   HCT 41.6 02/19/2023   PLT 185 02/19/2023   GLUCOSE 187 (H) 02/19/2023   CHOL 126 12/03/2022   TRIG 124.0 12/03/2022   HDL 38.90 (L) 12/03/2022   LDLCALC 62 12/03/2022   ALT 14 02/19/2023   AST 20 02/19/2023   NA 143 02/19/2023   K 4.0 02/19/2023   CL 106 02/19/2023   CREATININE 0.86 02/19/2023   BUN 11 02/19/2023   CO2 27 02/19/2023   TSH 1.21 12/03/2022   PSA 2.77 11/05/2021   INR 1.1 06/07/2016   HGBA1C 6.9 08/01/2023    DG UGI W DOUBLE CM (HD BA) Addendum Date: 07/25/2023 ADDENDUM REPORT: 07/25/2023 11:37 ADDENDUM: Stomach: Moderate paraesophageal hiatal hernia, as above. Otherwise normal mucosal appearance. Gastric emptying: Normal. Duodenum: Normal appearance. Electronically Signed   By: Ryan Chess M.D.   On: 07/25/2023 11:37   Result Date: 07/25/2023 CLINICAL DATA:  Patient with a history of hiatal hernia, esophageal stricture status post multiple dilations, Mallory-Weiss tear and spontaneous esophageal perforation status post stent placement 02/20/2023. Patient with complaints of severe reflux. History of C3 - C5 ACDF. EXAM: UPPER GI SERIES WITH HIGH DENSITY WITHOUT KUB TECHNIQUE: Combined double and single contrast examination was performed using effervescent crystals, high-density barium, and thin liquid barium. This exam was performed by Warren Dais NP, and was supervised and interpreted by Dr. Chess. FLUOROSCOPY: Radiation Exposure Index (as provided by the fluoroscopic device): 37.00 mGy Kerma.  Fluoroscopy time: 2.2 minutes. COMPARISON:  CT chest July 09, 2023. FINDINGS: Swallowing: Appears normal. No vestibular penetration or aspiration seen. Pharynx: Unremarkable. Esophagus: Cervical hardware creating posterior impression upon the cervical esophagus. Esophageal motility: Mild dysmotility with tertiary contractions Hiatal Hernia: Moderate paraesophageal hiatal hernia Gastroesophageal reflux: Severe, spontaneous, cyclical reflux. Contrast would reflux into the esophagus and return to the stomach followed immediately by the contrast refluxing back into the esophagus again. This cycle repeated numerous times. Reflux level reached the cervical esophagus. Ingested 13mm barium tablet: Passed normally Other: None. IMPRESSION: Moderate paraesophageal hiatal hernia with severe, spontaneous gastroesophageal reflux. Mild esophageal dysmotility. Electronically Signed: By: Ryan Chess M.D. On: 07/25/2023 11:20       Assessment & Plan:  Primary hypertension Assessment & Plan: Continues on imdur  and atenolol . Pressure as outlined.  Follow pressures. Follow metabolic panel.   Orders: -     Microalbumin / creatinine urine ratio; Future  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, without long-term current use of insulin , unspecified whether stage 3a or 3b CKD (HCC) -     Microalbumin / creatinine  urine ratio; Future -     Rosuvastatin  Calcium ; Take 1 tablet (20 mg total) by mouth daily.  Dispense: 90 tablet; Refill: 1  Hyperlipidemia, unspecified hyperlipidemia type Assessment & Plan: Continue crestor .  Low cholesterol diet and exercise.  Follow lipid panel and liver function tests.  No change in medication today.   Orders: -     Rosuvastatin  Calcium ; Take 1 tablet (20 mg total) by mouth daily.  Dispense: 90 tablet; Refill: 1  Abnormal liver function tests Assessment & Plan: Liver panel checked 07/04/23 - wnl.    Anemia, unspecified type Assessment &  Plan: Has been worked up by hematology. Being followed by oncology. Follow cbc and iron  studies.    Bilateral carotid artery disease, unspecified type [facility]) Assessment & Plan: 04/17/2020 carotid showed 40 to 59% narrowing of right and left carotid arteries.  Left carotid artery was unchanged; however, the right carotid artery has progressed.  Followed by cardiology and vascular. Continue statin medication and risk factor modification. Has f/u with Dr Marea this month.     Chronic systolic heart failure (HCC) Assessment & Plan: No evidence of volume overload on exam.  Continue imdur , beta blocker and losartan .  Stable. No change in medication today.    Coronary artery disease involving native coronary artery of native heart with angina pectoris [facility]) Assessment & Plan: 09/14/2018 cardiac cath showed patent distal RCA stent with no significant restenosis. Stable moderate disease in the mid LAD and proximal right coronary artery.  OM1 now completely occluded.  It was 99% stenosed before but overall a small branch.  Per note, the only difference noted from most recent angiography was progression of distal RCA disease just proximal to the stent and a tortuous segment causing about 60% stenosis.  It was also noted that he had normal LV systolic function with mildly elevated left ventricular end-diastolic pressure.  On imdur . Continue statin and risk factor modfication.  Denies chest pain now. Follow.    Gastroesophageal reflux disease, unspecified whether esophagitis present Assessment & Plan: Currently taking dexilant and pepcid. No acid reflux. Has f/u with week at [facility].    Psoriatic arthritis (HCC) Assessment & Plan: Followed by rheumatology.  Consentyx/MTX/gabapentin .  Overall stable.       Allena Hamilton, MD

## 2023-08-31 ENCOUNTER — Encounter: Payer: Self-pay | Admitting: Internal Medicine

## 2023-08-31 NOTE — Assessment & Plan Note (Signed)
 Liver panel checked 07/04/23 - wnl.

## 2023-08-31 NOTE — Assessment & Plan Note (Signed)
 Currently taking dexilant and pepcid. No acid reflux. Has f/u with week at [facility].

## 2023-08-31 NOTE — Assessment & Plan Note (Signed)
 09/14/2018 cardiac cath showed patent distal RCA stent with no significant restenosis. Stable moderate disease in the mid LAD and proximal right coronary artery.  OM1 now completely occluded.  It was 99% stenosed before but overall a small branch.  Per note, the only difference noted from most recent angiography was progression of distal RCA disease just proximal to the stent and a tortuous segment causing about 60% stenosis.  It was also noted that he had normal LV systolic function with mildly elevated left ventricular end-diastolic pressure.  On imdur . Continue statin and risk factor modfication.  Denies chest pain now. Follow.

## 2023-08-31 NOTE — Assessment & Plan Note (Signed)
 Followed by rheumatology.  Consentyx/MTX/gabapentin .  Overall stable.

## 2023-08-31 NOTE — Assessment & Plan Note (Signed)
 Continue crestor .  Low cholesterol diet and exercise.  Follow lipid panel and liver function tests.  No change in medication today.

## 2023-08-31 NOTE — Assessment & Plan Note (Signed)
 Has been worked up by hematology. Being followed by oncology. Follow cbc and iron  studies.

## 2023-08-31 NOTE — Assessment & Plan Note (Signed)
 04/17/2020 carotid showed 40 to 59% narrowing of right and left carotid arteries.  Left carotid artery was unchanged; however, the right carotid artery has progressed.  Followed by cardiology and vascular. Continue statin medication and risk factor modification. Has f/u with Dr Marea this month.

## 2023-08-31 NOTE — Assessment & Plan Note (Signed)
 Platelet coung on recent check 149. Followed by oncology.

## 2023-08-31 NOTE — Assessment & Plan Note (Signed)
 No evidence of volume overload on exam.  Continue imdur , beta blocker and losartan .  Stable. No change in medication today.

## 2023-08-31 NOTE — Assessment & Plan Note (Signed)
 Continues on imdur  and atenolol . Pressure as outlined.  Follow pressures. Follow metabolic panel.

## 2023-09-02 DIAGNOSIS — L4050 Arthropathic psoriasis, unspecified: Secondary | ICD-10-CM | POA: Diagnosis not present

## 2023-09-02 DIAGNOSIS — Z79899 Other long term (current) drug therapy: Secondary | ICD-10-CM | POA: Diagnosis not present

## 2023-09-03 ENCOUNTER — Other Ambulatory Visit: Payer: Self-pay | Admitting: Internal Medicine

## 2023-09-03 ENCOUNTER — Other Ambulatory Visit: Payer: Self-pay | Admitting: Cardiovascular Disease

## 2023-09-05 ENCOUNTER — Other Ambulatory Visit

## 2023-09-05 ENCOUNTER — Encounter (INDEPENDENT_AMBULATORY_CARE_PROVIDER_SITE_OTHER): Payer: Self-pay | Admitting: Vascular Surgery

## 2023-09-12 ENCOUNTER — Other Ambulatory Visit (INDEPENDENT_AMBULATORY_CARE_PROVIDER_SITE_OTHER)

## 2023-09-12 ENCOUNTER — Encounter (INDEPENDENT_AMBULATORY_CARE_PROVIDER_SITE_OTHER): Payer: Self-pay | Admitting: Nurse Practitioner

## 2023-09-12 ENCOUNTER — Encounter: Payer: Self-pay | Admitting: Oncology

## 2023-09-12 ENCOUNTER — Ambulatory Visit (INDEPENDENT_AMBULATORY_CARE_PROVIDER_SITE_OTHER): Payer: Self-pay | Admitting: Nurse Practitioner

## 2023-09-12 VITALS — BP 166/87 | HR 54 | Resp 18 | Ht 68.5 in | Wt 167.2 lb

## 2023-09-12 DIAGNOSIS — E785 Hyperlipidemia, unspecified: Secondary | ICD-10-CM | POA: Diagnosis not present

## 2023-09-12 DIAGNOSIS — N183 Chronic kidney disease, stage 3 unspecified: Secondary | ICD-10-CM

## 2023-09-12 DIAGNOSIS — E0822 Diabetes mellitus due to underlying condition with diabetic chronic kidney disease: Secondary | ICD-10-CM | POA: Diagnosis not present

## 2023-09-12 DIAGNOSIS — D696 Thrombocytopenia, unspecified: Secondary | ICD-10-CM

## 2023-09-12 DIAGNOSIS — I779 Disorder of arteries and arterioles, unspecified: Secondary | ICD-10-CM

## 2023-09-12 DIAGNOSIS — Z125 Encounter for screening for malignant neoplasm of prostate: Secondary | ICD-10-CM

## 2023-09-12 DIAGNOSIS — I10 Essential (primary) hypertension: Secondary | ICD-10-CM | POA: Diagnosis not present

## 2023-09-12 LAB — CBC WITH DIFFERENTIAL/PLATELET
Basophils Absolute: 0 K/uL (ref 0.0–0.1)
Basophils Relative: 0.6 % (ref 0.0–3.0)
Eosinophils Absolute: 0.4 K/uL (ref 0.0–0.7)
Eosinophils Relative: 10.1 % — ABNORMAL HIGH (ref 0.0–5.0)
HCT: 35.5 % — ABNORMAL LOW (ref 39.0–52.0)
Hemoglobin: 11.8 g/dL — ABNORMAL LOW (ref 13.0–17.0)
Lymphocytes Relative: 18 % (ref 12.0–46.0)
Lymphs Abs: 0.8 K/uL (ref 0.7–4.0)
MCHC: 33.3 g/dL (ref 30.0–36.0)
MCV: 90.2 fl (ref 78.0–100.0)
Monocytes Absolute: 0.6 K/uL (ref 0.1–1.0)
Monocytes Relative: 13.2 % — ABNORMAL HIGH (ref 3.0–12.0)
Neutro Abs: 2.5 K/uL (ref 1.4–7.7)
Neutrophils Relative %: 58.1 % (ref 43.0–77.0)
Platelets: 188 K/uL (ref 150.0–400.0)
RBC: 3.93 Mil/uL — ABNORMAL LOW (ref 4.22–5.81)
RDW: 18.1 % — ABNORMAL HIGH (ref 11.5–15.5)
WBC: 4.3 K/uL (ref 4.0–10.5)

## 2023-09-12 LAB — BASIC METABOLIC PANEL WITH GFR
BUN: 9 mg/dL (ref 6–23)
CO2: 30 meq/L (ref 19–32)
Calcium: 9.1 mg/dL (ref 8.4–10.5)
Chloride: 103 meq/L (ref 96–112)
Creatinine, Ser: 0.8 mg/dL (ref 0.40–1.50)
GFR: 86.92 mL/min (ref >60.00)
Glucose, Bld: 142 mg/dL — ABNORMAL HIGH (ref 70–99)
Potassium: 4.1 meq/L (ref 3.5–5.1)
Sodium: 141 meq/L (ref 135–145)

## 2023-09-12 LAB — HEPATIC FUNCTION PANEL
ALT: 9 U/L (ref 0–53)
AST: 13 U/L (ref 0–37)
Albumin: 4.1 g/dL (ref 3.5–5.2)
Alkaline Phosphatase: 56 U/L (ref 39–117)
Bilirubin, Direct: 0.1 mg/dL (ref 0.0–0.3)
Total Bilirubin: 0.4 mg/dL (ref 0.2–1.2)
Total Protein: 6.7 g/dL (ref 6.0–8.3)

## 2023-09-12 LAB — LIPID PANEL
Cholesterol: 96 mg/dL (ref 0–200)
HDL: 39.4 mg/dL (ref >39.00)
LDL Cholesterol: 44 mg/dL (ref 0–99)
NonHDL: 56.87
Total CHOL/HDL Ratio: 2
Triglycerides: 63 mg/dL (ref 0.0–149.0)
VLDL: 12.6 mg/dL (ref 0.0–40.0)

## 2023-09-12 LAB — MICROALBUMIN / CREATININE URINE RATIO
Creatinine,U: 139.3 mg/dL
Microalb Creat Ratio: 81.5 mg/g — ABNORMAL HIGH (ref 0.0–30.0)
Microalb, Ur: 11.3 mg/dL — ABNORMAL HIGH (ref 0.0–1.9)

## 2023-09-12 LAB — PSA, MEDICARE: PSA: 3.36 ng/mL (ref 0.10–4.00)

## 2023-09-12 LAB — HEMOGLOBIN A1C: Hgb A1c MFr Bld: 6.5 % (ref 4.6–6.5)

## 2023-09-13 ENCOUNTER — Ambulatory Visit: Payer: Self-pay | Admitting: Internal Medicine

## 2023-09-13 DIAGNOSIS — D649 Anemia, unspecified: Secondary | ICD-10-CM

## 2023-09-13 DIAGNOSIS — D696 Thrombocytopenia, unspecified: Secondary | ICD-10-CM

## 2023-09-15 ENCOUNTER — Encounter (INDEPENDENT_AMBULATORY_CARE_PROVIDER_SITE_OTHER): Payer: Self-pay | Admitting: Nurse Practitioner

## 2023-09-15 NOTE — Progress Notes (Signed)
 Subjective:    Patient ID: Shane Mcknight, male    DOB: 04-13-1950, 73 y.o.   MRN: [id number] Chief Complaint  Patient presents with   New Patient (Initial Visit)    Ref [place] consult bilateral carotid stenosis     The patient is seen for evaluation of carotid stenosis. The carotid stenosis was identified after carotid duplex on 07/28/2023.  The patient denies amaurosis fugax. There is no recent history of TIA symptoms or focal motor deficits. There is no prior documented CVA.  There is no history of migraine headaches. There is no history of seizures.  The patient is taking enteric-coated aspirin  81 mg daily.  No recent shortening of the patient's walking distance or new symptoms consistent with claudication.  No history of rest pain symptoms. No new ulcers or wounds of the lower extremities have occurred.  There is no history of DVT, PE or superficial thrombophlebitis. No recent episodes of angina or shortness of breath documented.   Ultrasound noted 60 to 79% stenosis of the left ICA with 40 to 59% stenosis of the right ICA.  The left is at the higher end of the spectrum     Review of Systems  All other systems reviewed and are negative.      Objective:   Physical Exam Vitals reviewed.  HENT:     Head: Normocephalic.  Neck:     Vascular: Carotid bruit present.  Cardiovascular:     Rate and Rhythm: Normal rate.     Heart sounds: Murmur heard.  Pulmonary:     Effort: Pulmonary effort is normal.  Skin:    General: Skin is warm and dry.  Neurological:     Mental Status: He is alert and oriented to person, place, and time.  Psychiatric:        Mood and Affect: Mood normal.        Behavior: Behavior normal.        Thought Content: Thought content normal.        Judgment: Judgment normal.     BP (!) 166/87   Pulse (!) 54   Resp 18   Ht 5' 8.5 (1.74 m)   Wt 167 lb 3.2 oz (75.8 kg)   BMI 25.05 kg/m   Past Medical History:  Diagnosis Date   Allergic state     Anemia    Aortic atherosclerosis (HCC)    Arthritis    spine, hands, shoulder with previous cuff tear   B12 deficiency    CAD (coronary artery disease)    a.) 11/2015 CT: Coronary Ca2+ noted. b.) LHC 06/14/2016: 60% pRCA, 60% RPLB-2, 99% OM1, 50% mLAD, 40% oLM-LM, 30% pLAD, 60% oD1-D1, 60% oLCx, 95% dRCA; PCI placing a 2.5 x 15mm Resolute Onyx DES dRCA. c.) LHC 09/14/2018: 40% oLM-LM, 50% mLAD, 30% pLAD, 60% oD1-D1, 40% oLCx, 100% OM1, 60% p-mRCA, 50% RPAV; dRCA stent widely patent; medical mgmt.   Candidiasis of esophagus (HCC)    Carotid artery stenosis 04/17/2020   a.) Carotid doppler 04/17/2020: 40-59% RICA/LICA stenosis.   Colon polyp    COVID-19    09/2021   DDD (degenerative disc disease), lumbosacral    Diastolic dysfunction 04/06/2020   a.)  TTE 04/06/2020; EF 55-60%; mild LA dilation; no RWMAs; G2DD.   Dysphagia    Effort angina (HCC)    Elevated PSA    Elevated transaminase level    Esophageal stricture    a.) s/p multiple dilations   Esophagitis, [place] grade D  Fatty liver    Ganglion cyst of finger of right hand    GERD (gastroesophageal reflux disease)    Guaiac positive stools    Hiatal hernia    HNP (herniated nucleus pulposus), lumbar    Hypercholesterolemia    Hypertension    Long term current use of immunosuppressive drug    a.) MTX + secukinumab  for severe psoriatic arthritis   Lumbar radiculitis    Mallory-Weiss tear 11/2015   Migraine aura without headache    OSA (obstructive sleep apnea)    a.) does not use nocturnal PAP therapy   Psoriasis    Psoriatic arthritis (HCC)    a.) severe; Tx'd with MTX + secukinumab    PVC (premature ventricular contraction)    Schatzki's ring    Type II diabetes mellitus (HCC)     Social History   Socioeconomic History   Marital status: Married    Spouse name: Heron   Number of children: Not on file   Years of education: Not on file   Highest education level: Bachelor's degree (e.g., BA, AB, BS)   Occupational History    Comment: not heavy work  Tobacco Use   Smoking status: Former    Current packs/day: 0.00    Average packs/day: 0.3 packs/day for 7.0 years (1.8 ttl pk-yrs)    Types: Cigarettes    Start date: 07/06/1975    Quit date: 07/06/1982    Years since quitting: 41.2    Passive exposure: Past   Smokeless tobacco: Never  Vaping Use   Vaping status: Never Used  Substance and Sexual Activity   Alcohol use: Not Currently   Drug use: No   Sexual activity: Not on file  Other Topics Concern   Not on file  Social History Narrative   Lives locally with wife.  Active but does not routinely exercise.   Social Drivers of Corporate investment banker Strain: Low Risk  (08/24/2023)   Overall Financial Resource Strain (CARDIA)    Difficulty of Paying Living Expenses: Not hard at all  Food Insecurity: No Food Insecurity (08/24/2023)   Hunger Vital Sign    Worried About Running Out of Food in the Last Year: Never true    Ran Out of Food in the Last Year: Never true  Transportation Needs: No Transportation Needs (08/24/2023)   PRAPARE - Administrator, Civil Service (Medical): No    Lack of Transportation (Non-Medical): No  Physical Activity: Insufficiently Active (08/24/2023)   Exercise Vital Sign    Days of Exercise per Week: 4 days    Minutes of Exercise per Session: 20 min  Stress: No Stress Concern Present (08/24/2023)   [employer] of Occupational Health - Occupational Stress Questionnaire    Feeling of Stress: Not at all  Social Connections: Moderately Integrated (08/24/2023)   Social Connection and Isolation Panel    Frequency of Communication with Friends and Family: Patient declined    Frequency of Social Gatherings with Friends and Family: More than three times a week    Attends Religious Services: More than 4 times per year    Active Member of [employer] or Organizations: No    Attends Banker Meetings: Not on file    Marital Status: Married   Intimate Partner Violence: Not At Risk (03/04/2023)   Humiliation, Afraid, Rape, and Kick questionnaire    Fear of Current or Ex-Partner: No    Emotionally Abused: No    Physically Abused: No    Sexually  Abused: No    Past Surgical History:  Procedure Laterality Date   ANTERIOR CERVICAL DECOMP/DISCECTOMY FUSION N/A 04/13/2021   Procedure: C3-5 ANTERIOR CERVICAL DISCECTOMY AND FUSION (GLOBUS ALLOGRAFT);  Surgeon: Clois Fret, MD;  Location: [facility] ORS;  Service: Neurosurgery;  Laterality: N/A;   ARTHROPLASTY     right thumb; left thumb with 2 screws and plastic joint   BALLOON DILATION N/A 05/29/2015   Procedure: BALLOON DILATION;  Surgeon: Gladis RAYMOND Mariner, MD;  Location: [facility] ENDOSCOPY;  Service: Endoscopy;  Laterality: N/A;   BALLOON DILATION N/A 02/14/2017   Procedure: BALLOON DILATION;  Surgeon: Mariner Gladis RAYMOND, MD;  Location: [facility] ENDOSCOPY;  Service: Endoscopy;  Laterality: N/A;   BILATERAL CARPAL TUNNEL RELEASE Bilateral 07/04/2017   Procedure: BILATERAL CARPAL TUNNEL RELEASE;  Surgeon: Maryl Barters, MD;  Location: [facility] ORS;  Service: Orthopedics;  Laterality: Bilateral;   BIOPSY  08/05/2022   Procedure: BIOPSY;  Surgeon: Maryruth Ole DASEN, MD;  Location: [facility] ENDOSCOPY;  Service: Endoscopy;;   BLEPHAROPLASTY     BUNIONECTOMY Left 12/2020   CARDIAC CATHETERIZATION     1 stent    CARPAL TUNNEL RELEASE  2005   right   CHOLECYSTECTOMY  2008   COLONOSCOPY     COLONOSCOPY WITH PROPOFOL  N/A 02/13/2018   Procedure: COLONOSCOPY WITH PROPOFOL ;  Surgeon: Mariner Gladis RAYMOND, MD;  Location: [facility] ENDOSCOPY;  Service: Endoscopy;  Laterality: N/A;   COLONOSCOPY WITH PROPOFOL  N/A 08/05/2022   Procedure: COLONOSCOPY WITH PROPOFOL ;  Surgeon: Maryruth Ole DASEN, MD;  Location: [facility] ENDOSCOPY;  Service: Endoscopy;  Laterality: N/A;   CORONARY STENT INTERVENTION N/A 06/14/2016   Procedure: Coronary Stent Intervention;  Surgeon: Darron Deatrice LABOR, MD;  Location: [facility] INVASIVE CV  LAB;  Service: Cardiovascular;  Laterality: N/A;   CYST EXCISION  04/14/2015   tendon sheath cyst excision; right ring finger cyst removed and trigger finger realease   ESOPHAGOGASTRODUODENOSCOPY     ESOPHAGOGASTRODUODENOSCOPY (EGD) WITH PROPOFOL  N/A 05/29/2015   Procedure: ESOPHAGOGASTRODUODENOSCOPY (EGD) WITH PROPOFOL ;  Surgeon: Gladis RAYMOND Mariner, MD;  Location: [facility] ENDOSCOPY;  Service: Endoscopy;  Laterality: N/A;   ESOPHAGOGASTRODUODENOSCOPY (EGD) WITH PROPOFOL  N/A 07/04/2015   Procedure: ESOPHAGOGASTRODUODENOSCOPY (EGD) WITH PROPOFOL ;  Surgeon: Gladis RAYMOND Mariner, MD;  Location: [facility] ENDOSCOPY;  Service: Endoscopy;  Laterality: N/A;   ESOPHAGOGASTRODUODENOSCOPY (EGD) WITH PROPOFOL  N/A 01/04/2016   Procedure: ESOPHAGOGASTRODUODENOSCOPY (EGD) WITH PROPOFOL ;  Surgeon: Gladis RAYMOND Mariner, MD;  Location: [facility] ENDOSCOPY;  Service: Endoscopy;  Laterality: N/A;   ESOPHAGOGASTRODUODENOSCOPY (EGD) WITH PROPOFOL  N/A 03/22/2016   Procedure: ESOPHAGOGASTRODUODENOSCOPY (EGD) WITH PROPOFOL ;  Surgeon: Gladis RAYMOND Mariner, MD;  Location: [facility] ENDOSCOPY;  Service: Endoscopy;  Laterality: N/A;   ESOPHAGOGASTRODUODENOSCOPY (EGD) WITH PROPOFOL  N/A 02/14/2017   Procedure: ESOPHAGOGASTRODUODENOSCOPY (EGD) WITH PROPOFOL ;  Surgeon: Mariner Gladis RAYMOND, MD;  Location: [facility] ENDOSCOPY;  Service: Endoscopy;  Laterality: N/A;   ESOPHAGOGASTRODUODENOSCOPY (EGD) WITH PROPOFOL  N/A 04/11/2017   Procedure: ESOPHAGOGASTRODUODENOSCOPY (EGD) WITH PROPOFOL ;  Surgeon: Mariner Gladis RAYMOND, MD;  Location: [facility] ENDOSCOPY;  Service: Endoscopy;  Laterality: N/A;   ESOPHAGOGASTRODUODENOSCOPY (EGD) WITH PROPOFOL  N/A 07/07/2017   Procedure: ESOPHAGOGASTRODUODENOSCOPY (EGD) WITH PROPOFOL ;  Surgeon: Jinny Carmine, MD;  Location: [facility] ENDOSCOPY;  Service: Endoscopy;  Laterality: N/A;   ESOPHAGOGASTRODUODENOSCOPY (EGD) WITH PROPOFOL  N/A 02/13/2018   Procedure: ESOPHAGOGASTRODUODENOSCOPY (EGD) WITH PROPOFOL ;  Surgeon: Mariner Gladis RAYMOND, MD;   Location: [facility] ENDOSCOPY;  Service: Endoscopy;  Laterality: N/A;   ESOPHAGOGASTRODUODENOSCOPY (EGD) WITH PROPOFOL  N/A 08/05/2022   Procedure: ESOPHAGOGASTRODUODENOSCOPY (EGD) WITH PROPOFOL ;  Surgeon: Maryruth Ole DASEN, MD;  Location: [facility] ENDOSCOPY;  Service: Endoscopy;  Laterality: N/A;   FOREIGN BODY REMOVAL N/A 12/03/2015   Procedure: FOREIGN BODY REMOVAL;  Surgeon: Jerrell Sol, MD;  Location: [facility] ENDOSCOPY;  Service: Endoscopy;  Laterality: N/A;   HERNIA REPAIR Right 1970   inguinal   INGUINAL HERNIA REPAIR Left 01/01/2018   Procedure: OPEN HERNIA REPAIR INGUINAL ADULT;  Surgeon: Desiderio Schanz, MD;  Location: [facility] ORS;  Service: General;  Laterality: Left;   LEFT HEART CATH Left 06/14/2016   Procedure: Left Heart Cath;  Surgeon: Darron Deatrice LABOR, MD;  Location: [facility] INVASIVE CV LAB;  Service: Cardiovascular;  Laterality: Left;   LEFT HEART CATH AND CORONARY ANGIOGRAPHY Left 09/14/2018   Procedure: LEFT HEART CATH AND CORONARY ANGIOGRAPHY;  Surgeon: Darron Deatrice LABOR, MD;  Location: [facility] INVASIVE CV LAB;  Service: Cardiovascular;  Laterality: Left;   LUMBAR DISC SURGERY  2004   injections; herniated disc; percutaneous discectomy   NASAL SINUS SURGERY  2008   POLYPECTOMY  08/05/2022   Procedure: POLYPECTOMY;  Surgeon: Maryruth Ole DASEN, MD;  Location: [facility] ENDOSCOPY;  Service: Endoscopy;;   ROTATOR CUFF REPAIR Right    TONSILLECTOMY      Family History  Problem Relation Age of Onset   Lung cancer Father    Cancer Father    Hypertension Mother    Aneurysm Son     Allergies  Allergen Reactions   Ephedrine  Other (See Comments)    Causing prostate to hurt. Using for sinus bronchodilators Pain in groin, prostate   Nsaids Other (See Comments)    GI upset, history of esophagitis and Mallory-Weis tear   Other Rash, Other (See Comments) and Nausea Only    Paper tape causes rash Plastic tape is okay GI upset, history of esophagitis and Mallory-Weis tear   Vancomycin Itching  and Rash    Same as pcn with itching   Amoxicillin Itching and Rash   Hyoscyamine Sulfate Other (See Comments)    Urinary retention Urine retention   Levsin [Hyoscyamine Sulfate] Other (See Comments)    Urinary retention   Naproxen Other (See Comments)    GI upset Also happens with other NSAIDS   Penicillins Itching and Rash    TOLERATED CEFAZOLIN  Has patient had a PCN reaction causing immediate rash, facial/tongue/throat swelling, SOB or lightheadedness with hypotension: yes Has patient had a PCN reaction causing severe rash involving mucus membranes or skin necrosis: no Has patient had a PCN reaction that required hospitalization: no Has patient had a PCN reaction occurring within the last 10 years: no If all of the above answers are NO, then may proceed with Cephalosporin use.    Shellfish Allergy Itching, Nausea Only and Rash    Ingested shellfish cCuses rash and itching along with stomach sickness. Pt tolerates betadine    Sudafed [Pseudoephedrine Hcl] Other (See Comments)    Constricts prostate flow (from ephedrine  d/t sinus meds)       Latest Ref Rng & Units 09/12/2023    9:54 AM 02/19/2023   12:25 PM 12/03/2022    7:59 AM  CBC  WBC 4.0 - 10.5 K/uL 4.3  7.6  5.6   Hemoglobin 13.0 - 17.0 g/dL 88.1  85.3  85.3   Hematocrit 39.0 - 52.0 % 35.5  41.6  42.5   Platelets 150.0 - 400.0 K/uL 188.0  185  143.0       CMP     Component Value Date/Time   NA 141 09/12/2023 0954   NA 141 06/07/2016 1456   K 4.1 09/12/2023  0954   CL 103 09/12/2023 0954   CO2 30 09/12/2023 0954   GLUCOSE 142 (H) 09/12/2023 0954   BUN 9 09/12/2023 0954   BUN 12 06/07/2016 1456   CREATININE 0.80 09/12/2023 0954   CALCIUM  9.1 09/12/2023 0954   PROT 6.7 09/12/2023 0954   ALBUMIN 4.1 09/12/2023 0954   AST 13 09/12/2023 0954   ALT 9 09/12/2023 0954   ALKPHOS 56 09/12/2023 0954   BILITOT 0.4 09/12/2023 0954   GFR 86.92 09/12/2023 0954   GFRNONAA >60 02/19/2023 1225     No results  found.     Assessment & Plan:   1. Bilateral carotid artery disease, unspecified type (HCC) (Primary) Recommend:  The patient remains asymptomatic with respect to the carotid stenosis.  However, the patient has now progressed and has a lesion the is >75%.  Patient should undergo CT angiography of the carotid arteries to define the degree of stenosis of the internal carotid arteries bilaterally and the anatomic suitability for surgery.  If the patient does indeed need surgery cardiac clearance will be required, once cleared the patient will be scheduled for surgery.  The risks, benefits and alternative therapies were reviewed in detail with the patient.  All questions were answered.  The patient agrees to proceed with imaging.  Continue antiplatelet therapy as prescribed. Continue management of CAD, HTN and Hyperlipidemia. Healthy heart diet, encouraged exercise at least 4 times per week.  - CT ANGIO NECK W OR WO CONTRAST; Future  2. 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, without long-term current use of insulin , unspecified whether stage 3a or 3b CKD (HCC) Continue hypoglycemic medications as already ordered, these medications have been reviewed and there are no changes at this time.  Hgb A1C to be monitored as already arranged by primary service  3. Hyperlipidemia, unspecified hyperlipidemia type Continue statin as ordered and reviewed, no changes at this time   Current Outpatient Medications on File Prior to Visit  Medication Sig Dispense Refill   acetaminophen  (TYLENOL ) 500 MG tablet Take 1,000 mg by mouth daily as needed for moderate pain.     aspirin  EC 81 MG tablet Take 81 mg by mouth every evening.      atenolol  (TENORMIN ) 50 MG tablet TAKE 1 TABLET BY MOUTH TWICE  DAILY 180 tablet 1   folic acid  (FOLVITE ) 1 MG tablet Take 1 mg by mouth daily.     gabapentin  (NEURONTIN ) 300 MG capsule TAKE 1 TO 2 CAPSULES BY  MOUTH AT BEDTIME  (Patient taking differently: No sig reported) 180 capsule 3   glucose blood (ONETOUCH ULTRA) test strip Used to check blood glucose one time per day. 100 each 12   isosorbide  mononitrate (IMDUR ) 30 MG 24 hr tablet TAKE ONE-HALF TABLET BY MOUTH  DAILY 45 tablet 3   methotrexate  (RHEUMATREX) 2.5 MG tablet Take 2.5 mg by mouth once a week. Pt states he takes 8 (2.5mg ) tablets weekly     nitroGLYCERIN  (NITROSTAT ) 0.4 MG SL tablet Place 1 tablet (0.4 mg total) under the tongue every 5 (five) minutes as needed. MAXIMUM OF 3 DOSES. 25 tablet 6   rosuvastatin  (CRESTOR ) 20 MG tablet Take 1 tablet (20 mg total) by mouth daily. 90 tablet 1   SYRINGE-NEEDLE, DISP, 3 ML 25G X 5/8 3 ML MISC Use as instructed with B12 injection. 50 each 11   vitamin E  400 UNIT capsule Take 400 Units by mouth in the morning.     No current  facility-administered medications on file prior to visit.    There are no Patient Instructions on file for this visit. No follow-ups on file.   Ziyon Cedotal E Idolina Mantell, NP

## 2023-09-16 ENCOUNTER — Telehealth (INDEPENDENT_AMBULATORY_CARE_PROVIDER_SITE_OTHER): Payer: Self-pay | Admitting: Nurse Practitioner

## 2023-09-16 NOTE — Telephone Encounter (Signed)
 Copied from CRM [id number]. Topic: Appointments - Appointment Cancel/Reschedule >> Sep 16, 2023  9:52 AM Shane Mcknight wrote: Patient/patient representative is calling to cancel or reschedule an appointment. Refer to attachments for appointment information.  I rescheduled his lab appointment to 10/13/23 at 8:30 AM. I did not see any orders in his chart and appointment note states recheck cbc. Recheck cbc, b12 and iron  studies. Please add orders before his appointment. Thanks

## 2023-09-16 NOTE — Telephone Encounter (Signed)
 Spoke with patient to give information to schedule CT. I advised to call radiology scheduling at [phone] and schedule CT and then call me back at [phone] to schedule an appt with Dr. Marea for CT results. Pt acknowledged.

## 2023-09-18 ENCOUNTER — Other Ambulatory Visit: Payer: Self-pay | Admitting: Internal Medicine

## 2023-09-26 ENCOUNTER — Ambulatory Visit
Admission: RE | Admit: 2023-09-26 | Discharge: 2023-09-26 | Disposition: A | Discharge status: Home / Self Care | Source: Ambulatory Visit | Attending: Nurse Practitioner | Admitting: Nurse Practitioner

## 2023-09-26 DIAGNOSIS — I779 Disorder of arteries and arterioles, unspecified: Secondary | ICD-10-CM | POA: Diagnosis not present

## 2023-09-26 DIAGNOSIS — I6523 Occlusion and stenosis of bilateral carotid arteries: Secondary | ICD-10-CM | POA: Diagnosis not present

## 2023-09-26 DIAGNOSIS — I6502 Occlusion and stenosis of left vertebral artery: Secondary | ICD-10-CM | POA: Diagnosis not present

## 2023-09-26 DIAGNOSIS — I700 Atherosclerosis of aorta: Secondary | ICD-10-CM | POA: Diagnosis not present

## 2023-09-26 DIAGNOSIS — I672 Cerebral atherosclerosis: Secondary | ICD-10-CM | POA: Diagnosis not present

## 2023-09-26 MED ORDER — IOHEXOL 350 MG/ML SOLN
75.0000 mL | Freq: Once | INTRAVENOUS | Status: AC | PRN
  Administered 2023-09-26: 75 mL via INTRAVENOUS

## 2023-10-07 ENCOUNTER — Ambulatory Visit (INDEPENDENT_AMBULATORY_CARE_PROVIDER_SITE_OTHER): Admitting: Vascular Surgery

## 2023-10-07 VITALS — BP 133/78 | HR 62 | Ht 68.5 in | Wt 173.2 lb

## 2023-10-07 DIAGNOSIS — E785 Hyperlipidemia, unspecified: Secondary | ICD-10-CM | POA: Diagnosis not present

## 2023-10-07 DIAGNOSIS — E0822 Diabetes mellitus due to underlying condition with diabetic chronic kidney disease: Secondary | ICD-10-CM | POA: Diagnosis not present

## 2023-10-07 DIAGNOSIS — I10 Essential (primary) hypertension: Secondary | ICD-10-CM

## 2023-10-07 DIAGNOSIS — I779 Disorder of arteries and arterioles, unspecified: Secondary | ICD-10-CM | POA: Diagnosis not present

## 2023-10-07 DIAGNOSIS — N183 Chronic kidney disease, stage 3 unspecified: Secondary | ICD-10-CM

## 2023-10-07 MED ORDER — CLOPIDOGREL BISULFATE 75 MG PO TABS: 75.0000 mg | ORAL_TABLET | Freq: Every day | ORAL | 6 refills | Status: AC

## 2023-10-07 NOTE — Patient Instructions (Signed)
 Carotid Angioplasty With Stent Carotid angioplasty with stent is done to widen or open an artery in the neck (carotid artery). This is done by placing a small piece of metal (stent) into the artery. The stent helps to keep the artery open so that blood can flow to the brain. This may be done when an artery gets blocked or gets too narrow. Tell your doctor about: Any allergies you have. All medicines you are taking. These include vitamins, herbs, eye drops, creams, and over-the-counter medicines. Any problems you or family members have had with anesthesia. Any bleeding problems you have. Any surgeries you have had. Any medical conditions you have. Whether you are pregnant or may be pregnant. What are the risks? Your health care provider will talk with you about risks. These may include: Stroke. The stent becoming blocked. A large amount of blood collecting under your skin (hematoma) at the access site. Allergic reactions to medicines or dyes. Damage to the carotid artery or to other parts of the body. Infection Heart attack. Death. This is rare. What happens before the procedure? Medicines Ask your doctor about changing or stopping: Your normal medicines. Vitamins, herbs, and supplements. Over-the-counter medicines. Ask your doctor whether you should take aspirin  or other blood thinners (anticoagulants) before the procedure. General instructions Follow instructions from your doctor about what you may eat and drink. Do not smoke or use any products that contain nicotine  or tobacco for at least 4 weeks before the procedure. If you need help quitting, ask your doctor. For your safety, your doctor may: Oneil the area of surgery. Ask you to wash with a soap that kills germs. You may have blood tests and imaging tests. What happens during the procedure?  An IV tube will be put into one of your veins. You may be given: A sedative. This helps you relax. Anesthesia. This will: Numb  certain areas of your body. Make you fall asleep for surgery. A cut (incision) will be made. Most often, the cut will be in your groin. In some cases, the cut may be in your wrist or arm instead. A small, thin tube (catheter) will be put through your cut and into your artery. An X-ray machine will be used to help with this. Dye will be put into the tube. The dye will travel to the narrow or blocked part of your artery. X-rays will be done. These will show where your artery is narrow or blocked. A filter will be put into your artery. This will trap buildup that comes loose. A small balloon will be put into your artery. It will be blown up for a few seconds to widen your artery. This balloon will then be removed. The stent will be placed in your artery. A second small balloon will be put into your artery. It will be blown up to expand the stent so that it can hold the artery walls open. The balloon will be removed. The tube and filter will be removed. Pressure will be held on your carotid artery to stop the bleeding. Your cut may be closed with stitches (sutures), skin glue, or skin tape (adhesive strips). A bandage (dressing) will be placed over your cut. The procedure may vary among doctors and hospitals. What happens after the procedure? You will be monitored until you leave the hospital or clinic. This includes checking your blood pressure, heart rate, breathing rate, and blood oxygen level. Your thinking abilities and movements will be checked. You may need to keep the area  still for a few hours. You may be told not to bend or cross your legs. You may need to have pressure put on the area of your cut to keep it from bleeding. You will need to keep the area still for a few hours, or as long as told by your doctor. If the procedure was done in your groin, you will be told not to bend or cross your legs. Most people stay in the hospital overnight. This information is not intended to replace  advice given to you by your health care provider. Make sure you discuss any questions you have with your health care provider. Document Revised: 05/29/2021 Document Reviewed: 05/29/2021 Elsevier Patient Education  2024 [employer].

## 2023-10-07 NOTE — Assessment & Plan Note (Signed)
 lipid control important in reducing the progression of atherosclerotic disease. Continue statin therapy

## 2023-10-07 NOTE — Progress Notes (Signed)
 MRN : [id number]  Shane Mcknight is a 73 y.o. (1950/04/25) male who presents with chief complaint of  Chief Complaint  Patient presents with   Follow-up  .  History of Present Illness: Patient returns today in follow up of his carotid disease.  He was seen by our office recently and set up for a CT angiogram.  He has not had any focal neurologic symptoms of cerebrovascular ischemia. Specifically, the patient denies amaurosis fugax, speech or swallowing difficulties, or arm or leg weakness or numbness.  I have independently reviewed his CT angiogram of the carotid arteries.  The official report is of an 80% right ICA stenosis and I would agree that it is at least that bad.  The left carotid stenosis is mild and the 30 to 40% range.  There is some vertebral artery and intracranial disease that appears mild to moderate in nature.     Current Outpatient Medications  Medication Sig Dispense Refill   clopidogrel  (PLAVIX ) 75 MG tablet Take 1 tablet (75 mg total) by mouth daily. 30 tablet 6   acetaminophen  (TYLENOL ) 500 MG tablet Take 1,000 mg by mouth daily as needed for moderate pain.     aspirin  EC 81 MG tablet Take 81 mg by mouth every evening.      atenolol  (TENORMIN ) 50 MG tablet TAKE 1 TABLET BY MOUTH TWICE  DAILY 180 tablet 1   folic acid  (FOLVITE ) 1 MG tablet Take 1 mg by mouth daily.     gabapentin  (NEURONTIN ) 300 MG capsule TAKE 1 TO 2 CAPSULES BY  MOUTH AT BEDTIME (Patient taking differently: No sig reported) 180 capsule 3   glucose blood (ONETOUCH ULTRA) test strip Used to check blood glucose one time per day. 100 each 12   isosorbide  mononitrate (IMDUR ) 30 MG 24 hr tablet TAKE ONE-HALF TABLET BY MOUTH  DAILY 45 tablet 3   methotrexate  (RHEUMATREX) 2.5 MG tablet Take 2.5 mg by mouth once a week. Pt states he takes 8 (2.5mg ) tablets weekly     nitroGLYCERIN  (NITROSTAT ) 0.4 MG SL tablet Place 1 tablet (0.4 mg total) under the tongue every 5 (five) minutes as needed. MAXIMUM OF 3 DOSES. 25  tablet 6   rosuvastatin  (CRESTOR ) 20 MG tablet Take 1 tablet (20 mg total) by mouth daily. 90 tablet 1   SYRINGE-NEEDLE, DISP, 3 ML 25G X 5/8 3 ML MISC Use as instructed with B12 injection. 50 each 11   vitamin E  400 UNIT capsule Take 400 Units by mouth in the morning.     No current facility-administered medications for this visit.    Past Medical History:  Diagnosis Date   Allergic state    Anemia    Aortic atherosclerosis    Arthritis    spine, hands, shoulder with previous cuff tear   B12 deficiency    CAD (coronary artery disease)    a.) 11/2015 CT: Coronary Ca2+ noted. b.) LHC 06/14/2016: 60% pRCA, 60% RPLB-2, 99% OM1, 50% mLAD, 40% oLM-LM, 30% pLAD, 60% oD1-D1, 60% oLCx, 95% dRCA; PCI placing a 2.5 x 15mm Resolute Onyx DES dRCA. c.) LHC 09/14/2018: 40% oLM-LM, 50% mLAD, 30% pLAD, 60% oD1-D1, 40% oLCx, 100% OM1, 60% p-mRCA, 50% RPAV; dRCA stent widely patent; medical mgmt.   Candidiasis of esophagus (HCC)    Carotid artery stenosis 04/17/2020   a.) Carotid doppler 04/17/2020: 40-59% RICA/LICA stenosis.   Colon polyp    COVID-19    09/2021   DDD (degenerative disc disease), lumbosacral  Diastolic dysfunction 04/06/2020   a.)  TTE 04/06/2020; EF 55-60%; mild LA dilation; no RWMAs; G2DD.   Dysphagia    Effort angina    Elevated PSA    Elevated transaminase level    Esophageal stricture    a.) s/p multiple dilations   Esophagitis, [place] grade D    Fatty liver    Ganglion cyst of finger of right hand    GERD (gastroesophageal reflux disease)    Guaiac positive stools    Hiatal hernia    HNP (herniated nucleus pulposus), lumbar    Hypercholesterolemia    Hypertension    Long term current use of immunosuppressive drug    a.) MTX + secukinumab  for severe psoriatic arthritis   Lumbar radiculitis    Mallory-Weiss tear 11/2015   Migraine aura without headache    OSA (obstructive sleep apnea)    a.) does not use nocturnal PAP therapy   Psoriasis    Psoriatic  arthritis (HCC)    a.) severe; Tx'd with MTX + secukinumab    PVC (premature ventricular contraction)    Schatzki's ring    Type II diabetes mellitus (HCC)     Past Surgical History:  Procedure Laterality Date   ANTERIOR CERVICAL DECOMP/DISCECTOMY FUSION N/A 04/13/2021   Procedure: C3-5 ANTERIOR CERVICAL DISCECTOMY AND FUSION (GLOBUS ALLOGRAFT);  Surgeon: Clois Fret, MD;  Location: [facility] ORS;  Service: Neurosurgery;  Laterality: N/A;   ARTHROPLASTY     right thumb; left thumb with 2 screws and plastic joint   BALLOON DILATION N/A 05/29/2015   Procedure: BALLOON DILATION;  Surgeon: Gladis RAYMOND Mariner, MD;  Location: [facility] ENDOSCOPY;  Service: Endoscopy;  Laterality: N/A;   BALLOON DILATION N/A 02/14/2017   Procedure: BALLOON DILATION;  Surgeon: Mariner Gladis RAYMOND, MD;  Location: [facility] ENDOSCOPY;  Service: Endoscopy;  Laterality: N/A;   BILATERAL CARPAL TUNNEL RELEASE Bilateral 07/04/2017   Procedure: BILATERAL CARPAL TUNNEL RELEASE;  Surgeon: Maryl Barters, MD;  Location: [facility] ORS;  Service: Orthopedics;  Laterality: Bilateral;   BIOPSY  08/05/2022   Procedure: BIOPSY;  Surgeon: Maryruth Ole DASEN, MD;  Location: [facility] ENDOSCOPY;  Service: Endoscopy;;   BLEPHAROPLASTY     BUNIONECTOMY Left 12/2020   CARDIAC CATHETERIZATION     1 stent    CARPAL TUNNEL RELEASE  2005   right   CHOLECYSTECTOMY  2008   COLONOSCOPY     COLONOSCOPY WITH PROPOFOL  N/A 02/13/2018   Procedure: COLONOSCOPY WITH PROPOFOL ;  Surgeon: Mariner Gladis RAYMOND, MD;  Location: [facility] ENDOSCOPY;  Service: Endoscopy;  Laterality: N/A;   COLONOSCOPY WITH PROPOFOL  N/A 08/05/2022   Procedure: COLONOSCOPY WITH PROPOFOL ;  Surgeon: Maryruth Ole DASEN, MD;  Location: [facility] ENDOSCOPY;  Service: Endoscopy;  Laterality: N/A;   CORONARY STENT INTERVENTION N/A 06/14/2016   Procedure: Coronary Stent Intervention;  Surgeon: Darron Deatrice LABOR, MD;  Location: [facility] INVASIVE CV LAB;  Service: Cardiovascular;  Laterality: N/A;   CYST  EXCISION  04/14/2015   tendon sheath cyst excision; right ring finger cyst removed and trigger finger realease   ESOPHAGOGASTRODUODENOSCOPY     ESOPHAGOGASTRODUODENOSCOPY (EGD) WITH PROPOFOL  N/A 05/29/2015   Procedure: ESOPHAGOGASTRODUODENOSCOPY (EGD) WITH PROPOFOL ;  Surgeon: Gladis RAYMOND Mariner, MD;  Location: [facility] ENDOSCOPY;  Service: Endoscopy;  Laterality: N/A;   ESOPHAGOGASTRODUODENOSCOPY (EGD) WITH PROPOFOL  N/A 07/04/2015   Procedure: ESOPHAGOGASTRODUODENOSCOPY (EGD) WITH PROPOFOL ;  Surgeon: Gladis RAYMOND Mariner, MD;  Location: [facility] ENDOSCOPY;  Service: Endoscopy;  Laterality: N/A;   ESOPHAGOGASTRODUODENOSCOPY (EGD) WITH PROPOFOL  N/A 01/04/2016   Procedure: ESOPHAGOGASTRODUODENOSCOPY (EGD) WITH PROPOFOL ;  Surgeon: Gladis RAYMOND Mariner, MD;  Location: [facility] ENDOSCOPY;  Service: Endoscopy;  Laterality: N/A;   ESOPHAGOGASTRODUODENOSCOPY (EGD) WITH PROPOFOL  N/A 03/22/2016   Procedure: ESOPHAGOGASTRODUODENOSCOPY (EGD) WITH PROPOFOL ;  Surgeon: Gladis RAYMOND Mariner, MD;  Location: [facility] ENDOSCOPY;  Service: Endoscopy;  Laterality: N/A;   ESOPHAGOGASTRODUODENOSCOPY (EGD) WITH PROPOFOL  N/A 02/14/2017   Procedure: ESOPHAGOGASTRODUODENOSCOPY (EGD) WITH PROPOFOL ;  Surgeon: Mariner Gladis RAYMOND, MD;  Location: [facility] ENDOSCOPY;  Service: Endoscopy;  Laterality: N/A;   ESOPHAGOGASTRODUODENOSCOPY (EGD) WITH PROPOFOL  N/A 04/11/2017   Procedure: ESOPHAGOGASTRODUODENOSCOPY (EGD) WITH PROPOFOL ;  Surgeon: Mariner Gladis RAYMOND, MD;  Location: [facility] ENDOSCOPY;  Service: Endoscopy;  Laterality: N/A;   ESOPHAGOGASTRODUODENOSCOPY (EGD) WITH PROPOFOL  N/A 07/07/2017   Procedure: ESOPHAGOGASTRODUODENOSCOPY (EGD) WITH PROPOFOL ;  Surgeon: Jinny Carmine, MD;  Location: [facility] ENDOSCOPY;  Service: Endoscopy;  Laterality: N/A;   ESOPHAGOGASTRODUODENOSCOPY (EGD) WITH PROPOFOL  N/A 02/13/2018   Procedure: ESOPHAGOGASTRODUODENOSCOPY (EGD) WITH PROPOFOL ;  Surgeon: Mariner Gladis RAYMOND, MD;  Location: [facility] ENDOSCOPY;  Service: Endoscopy;  Laterality: N/A;    ESOPHAGOGASTRODUODENOSCOPY (EGD) WITH PROPOFOL  N/A 08/05/2022   Procedure: ESOPHAGOGASTRODUODENOSCOPY (EGD) WITH PROPOFOL ;  Surgeon: Maryruth Ole DASEN, MD;  Location: [facility] ENDOSCOPY;  Service: Endoscopy;  Laterality: N/A;   FOREIGN BODY REMOVAL N/A 12/03/2015   Procedure: FOREIGN BODY REMOVAL;  Surgeon: Jerrell Sol, MD;  Location: [facility] ENDOSCOPY;  Service: Endoscopy;  Laterality: N/A;   HERNIA REPAIR Right 1970   inguinal   INGUINAL HERNIA REPAIR Left 01/01/2018   Procedure: OPEN HERNIA REPAIR INGUINAL ADULT;  Surgeon: Desiderio Schanz, MD;  Location: [facility] ORS;  Service: General;  Laterality: Left;   LEFT HEART CATH Left 06/14/2016   Procedure: Left Heart Cath;  Surgeon: Darron Deatrice LABOR, MD;  Location: [facility] INVASIVE CV LAB;  Service: Cardiovascular;  Laterality: Left;   LEFT HEART CATH AND CORONARY ANGIOGRAPHY Left 09/14/2018   Procedure: LEFT HEART CATH AND CORONARY ANGIOGRAPHY;  Surgeon: Darron Deatrice LABOR, MD;  Location: [facility] INVASIVE CV LAB;  Service: Cardiovascular;  Laterality: Left;   LUMBAR DISC SURGERY  2004   injections; herniated disc; percutaneous discectomy   NASAL SINUS SURGERY  2008   POLYPECTOMY  08/05/2022   Procedure: POLYPECTOMY;  Surgeon: Maryruth Ole DASEN, MD;  Location: [facility] ENDOSCOPY;  Service: Endoscopy;;   ROTATOR CUFF REPAIR Right    TONSILLECTOMY       Social History   Tobacco Use   Smoking status: Former    Current packs/day: 0.00    Average packs/day: 0.3 packs/day for 7.0 years (1.8 ttl pk-yrs)    Types: Cigarettes    Start date: 07/06/1975    Quit date: 07/06/1982    Years since quitting: 41.2    Passive exposure: Past   Smokeless tobacco: Never  Vaping Use   Vaping status: Never Used  Substance Use Topics   Alcohol use: Not Currently   Drug use: No      Family History  Problem Relation Age of Onset   Lung cancer Father    Cancer Father    Hypertension Mother    Aneurysm Son      Allergies  Allergen Reactions   Ephedrine  Other  (See Comments)    Causing prostate to hurt. Using for sinus bronchodilators Pain in groin, prostate   Nsaids Other (See Comments)    GI upset, history of esophagitis and Mallory-Weis tear   Other Rash, Other (See Comments) and Nausea Only    Paper tape causes rash Plastic tape is okay GI upset, history of esophagitis and Mallory-Weis tear   Vancomycin Itching and Rash  Same as pcn with itching   Amoxicillin Itching and Rash   Hyoscyamine Sulfate Other (See Comments)    Urinary retention Urine retention   Levsin [Hyoscyamine Sulfate] Other (See Comments)    Urinary retention   Naproxen Other (See Comments)    GI upset Also happens with other NSAIDS   Penicillins Itching and Rash    TOLERATED CEFAZOLIN  Has patient had a PCN reaction causing immediate rash, facial/tongue/throat swelling, SOB or lightheadedness with hypotension: yes Has patient had a PCN reaction causing severe rash involving mucus membranes or skin necrosis: no Has patient had a PCN reaction that required hospitalization: no Has patient had a PCN reaction occurring within the last 10 years: no If all of the above answers are NO, then may proceed with Cephalosporin use.    Shellfish Allergy Itching, Nausea Only and Rash    Ingested shellfish cCuses rash and itching along with stomach sickness. Pt tolerates betadine    Sudafed [Pseudoephedrine Hcl] Other (See Comments)    Constricts prostate flow (from ephedrine  d/t sinus meds)     REVIEW OF SYSTEMS (Negative unless checked)  Constitutional: [] Weight loss  [] Fever  [] Chills Cardiac: [] Chest pain   [] Chest pressure   [] Palpitations   [] Shortness of breath when laying flat   [] Shortness of breath at rest   [] Shortness of breath with exertion. Vascular:  [] Pain in legs with walking   [] Pain in legs at rest   [] Pain in legs when laying flat   [] Claudication   [] Pain in feet when walking  [] Pain in feet at rest  [] Pain in feet when laying flat   [] History of DVT    [] Phlebitis   [] Swelling in legs   [] Varicose veins   [] Non-healing ulcers Pulmonary:   [] Uses home oxygen   [] Productive cough   [] Hemoptysis   [] Wheeze  [] COPD   [] Asthma Neurologic:  [] Dizziness  [] Blackouts   [] Seizures   [] History of stroke   [] History of TIA  [] Aphasia   [] Temporary blindness   [x] Dysphagia   [] Weakness or numbness in arms   [] Weakness or numbness in legs Musculoskeletal:  [x] Arthritis   [] Joint swelling   [] Joint pain   [] Low back pain Hematologic:  [] Easy bruising  [] Easy bleeding   [] Hypercoagulable state   [x] Anemic   Gastrointestinal:  [] Blood in stool   [] Vomiting blood  [x] Gastroesophageal reflux/heartburn   [] Abdominal pain Genitourinary:  [] Chronic kidney disease   [] Difficult urination  [] Frequent urination  [] Burning with urination   [] Hematuria Skin:  [] Rashes   [] Ulcers   [] Wounds Psychological:  [] History of anxiety   []  History of major depression.  Physical Examination  BP 133/78   Pulse 62   Ht 5' 8.5 (1.74 m)   Wt 173 lb 4 oz (78.6 kg)   BMI 25.96 kg/m  Gen:  WD/WN, NAD. Appears younger than stated age. Head: [place]/AT, No temporalis wasting. Ear/Nose/Throat: Hearing grossly intact, nares w/o erythema or drainage Eyes: Conjunctiva clear. Sclera non-icteric Neck: Supple.  Trachea midline Pulmonary:  Good air movement, no use of accessory muscles.  Cardiac: RRR, no JVD Vascular:  Vessel Right Left  Radial Palpable Palpable               Musculoskeletal: M/S 5/5 throughout.  No deformity or atrophy. No edema. Neurologic: Sensation grossly intact in extremities.  Symmetrical.  Speech is fluent.  Psychiatric: Judgment intact, Mood & affect appropriate for pt's clinical situation. Dermatologic: No rashes or ulcers noted.  No cellulitis or open wounds.  Labs Recent Results (from the past 2160 hours)  Hemoglobin A1c     Status: None   Collection Time: 08/01/23 12:00 AM  Result Value Ref Range   Hemoglobin A1C 6.9   Urine Microalbumin  w/creat. ratio     Status: Abnormal   Collection Time: 09/12/23  9:54 AM  Result Value Ref Range   Microalb, Ur 11.3 (H) 0.0 - 1.9 mg/dL   Creatinine,U 860.6 mg/dL   Microalb Creat Ratio 81.5 (H) 0.0 - 30.0 mg/g  PSA, Medicare     Status: None   Collection Time: 09/12/23  9:54 AM  Result Value Ref Range   PSA 3.36 0.10 - 4.00 ng/ml    Comment: Test performed using Access Hybritech PSA Assay, a parmagnetic partical, chemiluminecent immunoassay.  Lipid panel     Status: None   Collection Time: 09/12/23  9:54 AM  Result Value Ref Range   Cholesterol 96 0 - 200 mg/dL    Comment: ATP III Classification       Desirable:  < 200 mg/dL               Borderline High:  200 - 239 mg/dL          High:  > = 759 mg/dL   Triglycerides 36.9 0.0 - 149.0 mg/dL    Comment: Normal:  <849 mg/dLBorderline High:  150 - 199 mg/dL   HDL 60.59 >60.99 mg/dL   VLDL 87.3 0.0 - 59.9 mg/dL   LDL Cholesterol 44 0 - 99 mg/dL   Total CHOL/HDL Ratio 2     Comment:                Men          Women1/2 Average Risk     3.4          3.3Average Risk          5.0          4.42X Average Risk          9.6          7.13X Average Risk          15.0          11.0                       NonHDL 56.87     Comment: NOTE:  Non-HDL goal should be 30 mg/dL higher than patient's LDL goal (i.e. LDL goal of < 70 mg/dL, would have non-HDL goal of < 100 mg/dL)  Hemoglobin J8r     Status: None   Collection Time: 09/12/23  9:54 AM  Result Value Ref Range   Hgb A1c MFr Bld 6.5 4.6 - 6.5 %    Comment: Glycemic Control Guidelines for People with Diabetes:Non Diabetic:  <6%Goal of Therapy: <7%Additional Action Suggested:  >8%   Hepatic function panel     Status: None   Collection Time: 09/12/23  9:54 AM  Result Value Ref Range   Total Bilirubin 0.4 0.2 - 1.2 mg/dL   Bilirubin, Direct 0.1 0.0 - 0.3 mg/dL   Alkaline Phosphatase 56 39 - 117 U/L   AST 13 0 - 37 U/L   ALT 9 0 - 53 U/L   Total Protein 6.7 6.0 - 8.3 g/dL   Albumin 4.1 3.5 - 5.2  g/dL  Basic metabolic panel     Status: Abnormal   Collection Time: 09/12/23  9:54 AM  Result Value Ref Range  Sodium 141 135 - 145 mEq/L   Potassium 4.1 3.5 - 5.1 mEq/L   Chloride 103 96 - 112 mEq/L   CO2 30 19 - 32 mEq/L   Glucose, Bld 142 (H) 70 - 99 mg/dL   BUN 9 6 - 23 mg/dL   Creatinine, Ser 9.19 0.40 - 1.50 mg/dL   GFR 13.07 >39.99 mL/min    Comment: Calculated using the CKD-EPI Creatinine Equation (2021)   Calcium  9.1 8.4 - 10.5 mg/dL  CBC with Differential/Platelet     Status: Abnormal   Collection Time: 09/12/23  9:54 AM  Result Value Ref Range   WBC 4.3 4.0 - 10.5 K/uL   RBC 3.93 (L) 4.22 - 5.81 Mil/uL   Hemoglobin 11.8 (L) 13.0 - 17.0 g/dL   HCT 64.4 (L) 60.9 - 47.9 %   MCV 90.2 78.0 - 100.0 fl   MCHC 33.3 30.0 - 36.0 g/dL   RDW 81.8 (H) 88.4 - 84.4 %   Platelets 188.0 150.0 - 400.0 K/uL   Neutrophils Relative % 58.1 43.0 - 77.0 %   Lymphocytes Relative 18.0 12.0 - 46.0 %   Monocytes Relative 13.2 (H) 3.0 - 12.0 %   Eosinophils Relative 10.1 (H) 0.0 - 5.0 %   Basophils Relative 0.6 0.0 - 3.0 %   Neutro Abs 2.5 1.4 - 7.7 K/uL   Lymphs Abs 0.8 0.7 - 4.0 K/uL   Monocytes Absolute 0.6 0.1 - 1.0 K/uL   Eosinophils Absolute 0.4 0.0 - 0.7 K/uL   Basophils Absolute 0.0 0.0 - 0.1 K/uL    Radiology CT ANGIO NECK W OR WO CONTRAST Result Date: 10/03/2023 CLINICAL DATA:  Carotid artery stenosis EXAM: CT ANGIOGRAPHY NECK TECHNIQUE: Multidetector CT imaging of the neck was performed using the standard protocol during bolus administration of intravenous contrast. Multiplanar CT image reconstructions and MIPs were obtained to evaluate the vascular anatomy. Carotid stenosis measurements (when applicable) are obtained utilizing NASCET criteria, using the distal internal carotid diameter as the denominator. RADIATION DOSE REDUCTION: This exam was performed according to the departmental dose-optimization program which includes automated exposure control, adjustment of the mA and/or kV  according to patient size and/or use of iterative reconstruction technique. CONTRAST:  75mL OMNIPAQUE  IOHEXOL  350 MG/ML SOLN COMPARISON:  None Available. FINDINGS: Aortic arch: Aortic atherosclerosis.  Branching pattern is normal. Right carotid system: Common carotid artery widely patent to the bifurcation. Calcified plaque within the ICA bulb and cervical ICA just distal to the bulb, with severe stenosis. At the proximal ICA, stenosis measures 40%. In the distal balled and cervical ICA beyond that, the diameter is as narrow as 1 mm, consistent with 80% stenosis. Beyond that the vessel is patent to and through the skull base to the circle-of-Willis. Left carotid system: Common carotid artery widely patent to the bifurcation. Calcified plaque at the carotid bifurcation and ICA bulb. Minimal diameter is proximal to the bifurcation measures 3 mm, consistent with a 40% stenosis. No measurable stenosis in the bowl or beyond that to the level of C1. There is calcified plaque at the C1 level with narrowing of the lumen to a measurement of 3.5 mm. This is a 30% stenosis. Beyond that the vessel is patent to the circle-of-Willis. Vertebral arteries: Calcified plaque at the non dominant right vertebral artery origin with stenosis estimated at 50%. The vessel is patent beyond that through the cervical region, through the foramen magnum and terminating in PICA. On the left, there is calcified plaque at the vertebral artery origin with stenosis  estimated at 30-50%. Beyond that, the vessel is patent through the cervical region to the foramen magnum. In the V4 segment, there is atherosclerotic disease with stenosis estimated at 50-70%. The vessel is then patent to the basilar artery. Skeleton: Distant ACDF C3 through C5. Spondylosis above and below that. Other neck: No mass or lymphadenopathy. Upper chest: Lung apices are clear. IMPRESSION: 1. Aortic atherosclerosis. 2. Calcified plaque at the right carotid bifurcation and ICA  bulb. 40% stenosis in the proximal ICA. 80% stenosis in the distal bulb and cervical ICA beyond that. 3. Calcified plaque at the left carotid bifurcation and ICA bulb. 40% stenosis in the proximal ICA. 30% stenosis in the distal bulb. 4. 50% stenosis at the non dominant right vertebral artery origin. 30-50% stenosis at the left vertebral artery origin. 50-70% stenosis in the V4 segment of the left vertebral artery. Aortic Atherosclerosis (ICD10-I70.0). Electronically Signed   By: Oneil Officer M.D.   On: 10/03/2023 14:49    Assessment/Plan  Carotid artery disease I have independently reviewed his CT angiogram of the carotid arteries.  The official report is of an 80% right ICA stenosis and I would agree that it is at least that bad.  The left carotid stenosis is mild and the 30 to 40% range.  There is some vertebral artery and intracranial disease that appears mild to moderate in nature.  Obviously, the most pressing issue is this high-grade right carotid artery stenosis.  He is on aspirin  and a statin agent.  I have discussed options including carotid endarterectomy and carotid stenting.  His anatomy has some limitations for each way but does not appear to be prohibitive for either way.  He has a bovine arch but the right side does not appear too steep and I think he will be easy to track a stent up there.  He does have some significant tortuosity well distal to the lesion but the area of the lesion does not appear to be as tortuous.  He also has a retropharyngeal course of his carotid artery which could complicate surgical therapy.  After discussions with the patient, he would like to proceed with carotid artery stent placement.  The timing of which is a little questionable for him because he has a wife with multiple ongoing issues and so he may have to delay this for some time.  We discussed the small but real risk of stroke from a high-grade carotid lesion over time.  We discussed that we are performing  this for stroke risk reduction and trading a small amount of upfront risk for long-term benefit which she understands.  I will go ahead and prescribe him Plavix  today.  We will call him in a few days to discuss scheduling of the procedure.  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, without long-term current use of insulin  (HCC) blood glucose control important in reducing the progression of atherosclerotic disease. Also, involved in wound healing. On appropriate medications.   Hyperlipidemia lipid control important in reducing the progression of atherosclerotic disease. Continue statin therapy    Selinda Gu, MD  10/07/2023 4:24 PM    This note was created with Dragon medical transcription system.  Any errors from dictation are purely unintentional

## 2023-10-07 NOTE — Assessment & Plan Note (Addendum)
 I have independently reviewed his CT angiogram of the carotid arteries.  The official report is of an 80% right ICA stenosis and I would agree that it is at least that bad.  The left carotid stenosis is mild and the 30 to 40% range.  There is some vertebral artery and intracranial disease that appears mild to moderate in nature.  Obviously, the most pressing issue is this high-grade right carotid artery stenosis.  He is on aspirin  and a statin agent.  I have discussed options including carotid endarterectomy and carotid stenting.  His anatomy has some limitations for each way but does not appear to be prohibitive for either way.  He has a bovine arch but the right side does not appear too steep and I think he will be easy to track a stent up there.  He does have some significant tortuosity well distal to the lesion but the area of the lesion does not appear to be as tortuous.  He also has a retropharyngeal course of his carotid artery which could complicate surgical therapy.  After discussions with the patient, he would like to proceed with carotid artery stent placement.  The timing of which is a little questionable for him because he has a wife with multiple ongoing issues and so he may have to delay this for some time.  We discussed the small but real risk of stroke from a high-grade carotid lesion over time.  We discussed that we are performing this for stroke risk reduction and trading a small amount of upfront risk for long-term benefit which she understands.  I will go ahead and prescribe him Plavix  today.  We will call him in a few days to discuss scheduling of the procedure.

## 2023-10-07 NOTE — Assessment & Plan Note (Signed)
 blood glucose control important in reducing the progression of atherosclerotic disease. Also, involved in wound healing. On appropriate medications.

## 2023-10-10 ENCOUNTER — Other Ambulatory Visit

## 2023-10-10 ENCOUNTER — Other Ambulatory Visit (INDEPENDENT_AMBULATORY_CARE_PROVIDER_SITE_OTHER)

## 2023-10-10 ENCOUNTER — Telehealth (INDEPENDENT_AMBULATORY_CARE_PROVIDER_SITE_OTHER): Payer: Self-pay

## 2023-10-10 DIAGNOSIS — K2100 Gastro-esophageal reflux disease with esophagitis, without bleeding: Secondary | ICD-10-CM | POA: Diagnosis not present

## 2023-10-10 DIAGNOSIS — D649 Anemia, unspecified: Secondary | ICD-10-CM | POA: Diagnosis not present

## 2023-10-10 DIAGNOSIS — D696 Thrombocytopenia, unspecified: Secondary | ICD-10-CM | POA: Diagnosis not present

## 2023-10-10 DIAGNOSIS — R198 Other specified symptoms and signs involving the digestive system and abdomen: Secondary | ICD-10-CM | POA: Diagnosis not present

## 2023-10-10 LAB — VITAMIN B12: Vitamin B-12: 214 pg/mL (ref 211–911)

## 2023-10-10 LAB — IBC + FERRITIN
Ferritin: 27.6 ng/mL (ref 22.0–322.0)
Iron: 58 ug/dL (ref 42–165)
Saturation Ratios: 16.4 % — ABNORMAL LOW (ref 20.0–50.0)
TIBC: 352.8 ug/dL (ref 250.0–450.0)
Transferrin: 252 mg/dL (ref 212.0–360.0)

## 2023-10-10 LAB — CBC WITH DIFFERENTIAL/PLATELET
Basophils Absolute: 0 K/uL (ref 0.0–0.1)
Basophils Relative: 0.5 % (ref 0.0–3.0)
Eosinophils Absolute: 0.3 K/uL (ref 0.0–0.7)
Eosinophils Relative: 6.8 % — ABNORMAL HIGH (ref 0.0–5.0)
HCT: 37.6 % — ABNORMAL LOW (ref 39.0–52.0)
Hemoglobin: 12.7 g/dL — ABNORMAL LOW (ref 13.0–17.0)
Lymphocytes Relative: 16.7 % (ref 12.0–46.0)
Lymphs Abs: 0.8 K/uL (ref 0.7–4.0)
MCHC: 33.7 g/dL (ref 30.0–36.0)
MCV: 89.9 fl (ref 78.0–100.0)
Monocytes Absolute: 0.6 K/uL (ref 0.1–1.0)
Monocytes Relative: 12.3 % — ABNORMAL HIGH (ref 3.0–12.0)
Neutro Abs: 3 K/uL (ref 1.4–7.7)
Neutrophils Relative %: 63.7 % (ref 43.0–77.0)
Platelets: 150 K/uL (ref 150.0–400.0)
RBC: 4.18 Mil/uL — ABNORMAL LOW (ref 4.22–5.81)
RDW: 16.7 % — ABNORMAL HIGH (ref 11.5–15.5)
WBC: 4.7 K/uL (ref 4.0–10.5)

## 2023-10-10 NOTE — Telephone Encounter (Signed)
 I spoke with the  patient to schedule him for a right carotid stent placement with Dr. Marea. Patient was offered 10/13/23 and declined. Patient stated his wife has something on that day. I offered 10/20/23 and he declined stating he had been out of work for 3 months and needed to work. Patient stated he will call to schedule his procedure when it is closer to the end of November because that is when he will have vacation time.

## 2023-10-13 ENCOUNTER — Ambulatory Visit: Payer: Self-pay | Admitting: Internal Medicine

## 2023-10-16 NOTE — Telephone Encounter (Signed)
 See if he is able to come in for the first B12 and confirm giving correctly and then can continue B12 injections 1000mcg q week x 3 more weeks and then 1000mcg q month.

## 2023-10-17 NOTE — Telephone Encounter (Signed)
 called pt he stated no need for an office visit he understants that B12 injections are given IM and insulin  injections are given subcutaneous. He is doing well and no issues

## 2023-10-17 NOTE — Telephone Encounter (Signed)
 See me about this if questions. Please call him. Does this mean he does not want to come in?  In his note, he reports that he has given himself insulin  injections. Insulin  injections and B12 injections are not given the same way.  B12 injections are given IM and insulin  injections are given subcutaneous.

## 2023-10-18 NOTE — Telephone Encounter (Signed)
 See me before calling. Reviewed. He needs a nurse visit, so that we can confirm he knows how much medication to give and proper way to draw up the medication and administer.

## 2023-10-20 NOTE — Telephone Encounter (Signed)
 Please see me about this. I have discussed with office manager. Instructions on proper way to give B12.

## 2023-10-20 NOTE — Telephone Encounter (Signed)
 Called pt to schedule him for a nurse visits on proper education amount to give himself and ways to draw up medication but he declined & consistently ensuring he know what he is doing wasn't able to schedule appointment

## 2023-10-22 NOTE — Telephone Encounter (Signed)
 Lvm for pt to give office a call back. Please transfer call to office

## 2023-11-06 LAB — OPHTHALMOLOGY REPORT-SCANNED

## 2023-11-07 DIAGNOSIS — H2513 Age-related nuclear cataract, bilateral: Secondary | ICD-10-CM | POA: Diagnosis not present

## 2023-11-07 DIAGNOSIS — M3501 Sicca syndrome with keratoconjunctivitis: Secondary | ICD-10-CM | POA: Diagnosis not present

## 2023-11-07 DIAGNOSIS — E119 Type 2 diabetes mellitus without complications: Secondary | ICD-10-CM | POA: Diagnosis not present

## 2023-11-07 DIAGNOSIS — H04221 Epiphora due to insufficient drainage, right lacrimal gland: Secondary | ICD-10-CM | POA: Diagnosis not present

## 2023-11-10 ENCOUNTER — Ambulatory Visit

## 2023-11-12 ENCOUNTER — Ambulatory Visit (INDEPENDENT_AMBULATORY_CARE_PROVIDER_SITE_OTHER): Admitting: *Deleted

## 2023-11-12 VITALS — Ht 68.0 in | Wt 160.0 lb

## 2023-11-12 DIAGNOSIS — Z0000 Encounter for general adult medical examination without abnormal findings: Secondary | ICD-10-CM

## 2023-11-12 NOTE — Progress Notes (Signed)
 Subjective:   Shane Mcknight is a 73 y.o. who presents for a Medicare Wellness preventive visit.  As a reminder, Annual Wellness Visits don't include a physical exam, and some assessments may be limited, especially if this visit is performed virtually. We may recommend an in-person follow-up visit with your provider if needed.  Visit Complete: Virtual I connected with  Shane Mcknight on 11/12/23 by a audio enabled telemedicine application and verified that I am speaking with the correct person using two identifiers.  Patient Location: Home  Provider Location: Home Office  I discussed the limitations of evaluation and management by telemedicine. The patient expressed understanding and agreed to proceed.  Vital Signs: Because this visit was a virtual/telehealth visit, some criteria may be missing or patient reported. Any vitals not documented were not able to be obtained and vitals that have been documented are patient reported.  VideoDeclined- This patient declined Librarian, academic. Therefore the visit was completed with audio only.  Persons Participating in Visit: Patient.  AWV Questionnaire: No: Patient Medicare AWV questionnaire was not completed prior to this visit.  Cardiac Risk Factors include: advanced age (>33men, >47 women);diabetes mellitus;male gender;hypertension;dyslipidemia;Other (see comment), Risk factor comments: CAD     Objective:    Today's Vitals   11/12/23 1335  Weight: 160 lb (72.6 kg)  Height: 5' 8 (1.727 m)   Body mass index is 24.33 kg/m.     11/12/2023    1:56 PM 02/19/2023   12:24 PM 08/05/2022    9:20 AM 03/18/2022    3:15 PM 03/04/2022    1:14 PM 02/25/2022    3:27 PM 04/13/2021    3:22 PM  Advanced Directives  Does Patient Have a Medical Advance Directive? No No Yes Yes Yes Yes Yes  Type of Surveyor, [employer]erals;Living will Healthcare Power of [place];Living will Healthcare Power of  [place];Living will Healthcare Power of [employer] Power of Attorney  Does patient want to make changes to medical advance directive?     No - Patient declined  No - Patient declined  Copy of Healthcare Power of Attorney in Chart?    No - copy requested No - copy requested    Would patient like information on creating a medical advance directive? No - Patient declined No - Patient declined         Current Medications (verified) Outpatient Encounter Medications as of 11/12/2023  Medication Sig   acetaminophen  (TYLENOL ) 500 MG tablet Take 1,000 mg by mouth daily as needed for moderate pain.   aspirin  EC 81 MG tablet Take 81 mg by mouth every evening.    atenolol  (TENORMIN ) 50 MG tablet TAKE 1 TABLET BY MOUTH TWICE  DAILY   clopidogrel  (PLAVIX ) 75 MG tablet Take 1 tablet (75 mg total) by mouth daily.   folic acid  (FOLVITE ) 1 MG tablet Take 1 mg by mouth daily.   gabapentin  (NEURONTIN ) 300 MG capsule TAKE 1 TO 2 CAPSULES BY  MOUTH AT BEDTIME (Patient taking differently: TAKE 1 TO 2 CAPSULES BY  MOUTH AT BEDTIME AS NEEDED)   glucose blood (ONETOUCH ULTRA) test strip Used to check blood glucose one time per day.   isosorbide  mononitrate (IMDUR ) 30 MG 24 hr tablet TAKE ONE-HALF TABLET BY MOUTH  DAILY   methotrexate  (RHEUMATREX) 2.5 MG tablet Take 2.5 mg by mouth once a week. Pt states he takes 8 (2.5mg ) tablets weekly   nitroGLYCERIN  (NITROSTAT ) 0.4 MG SL tablet Place 1  tablet (0.4 mg total) under the tongue every 5 (five) minutes as needed. MAXIMUM OF 3 DOSES.   rosuvastatin  (CRESTOR ) 20 MG tablet Take 1 tablet (20 mg total) by mouth daily.   SYRINGE-NEEDLE, DISP, 3 ML 25G X 5/8 3 ML MISC Use as instructed with B12 injection.   vitamin E  400 UNIT capsule Take 400 Units by mouth in the morning. (Patient not taking: Reported on 11/12/2023)   No facility-administered encounter medications on file as of 11/12/2023.    Allergies (verified) Ephedrine , Nsaids, Other, Vancomycin, Amoxicillin,  Hyoscyamine sulfate, Levsin [hyoscyamine sulfate], Naproxen, Penicillins, Shellfish allergy, and Sudafed [pseudoephedrine hcl]   History: Past Medical History:  Diagnosis Date   Allergic state    Anemia    Aortic atherosclerosis    Arthritis    spine, hands, shoulder with previous cuff tear   B12 deficiency    CAD (coronary artery disease)    a.) 11/2015 CT: Coronary Ca2+ noted. b.) LHC 06/14/2016: 60% pRCA, 60% RPLB-2, 99% OM1, 50% mLAD, 40% oLM-LM, 30% pLAD, 60% oD1-D1, 60% oLCx, 95% dRCA; PCI placing a 2.5 x 15mm Resolute Onyx DES dRCA. c.) LHC 09/14/2018: 40% oLM-LM, 50% mLAD, 30% pLAD, 60% oD1-D1, 40% oLCx, 100% OM1, 60% p-mRCA, 50% RPAV; dRCA stent widely patent; medical mgmt.   Candidiasis of esophagus (HCC)    Carotid artery stenosis 04/17/2020   a.) Carotid doppler 04/17/2020: 40-59% RICA/LICA stenosis.   Colon polyp    COVID-19    09/2021   DDD (degenerative disc disease), lumbosacral    Diastolic dysfunction 04/06/2020   a.)  TTE 04/06/2020; EF 55-60%; mild LA dilation; no RWMAs; G2DD.   Dysphagia    Effort angina    Elevated PSA    Elevated transaminase level    Esophageal stricture    a.) s/p multiple dilations   Esophagitis, [place] grade D    Fatty liver    Ganglion cyst of finger of right hand    GERD (gastroesophageal reflux disease)    Guaiac positive stools    Hiatal hernia    HNP (herniated nucleus pulposus), lumbar    Hypercholesterolemia    Hypertension    Long term current use of immunosuppressive drug    a.) MTX + secukinumab  for severe psoriatic arthritis   Lumbar radiculitis    Mallory-Weiss tear 11/2015   Migraine aura without headache    OSA (obstructive sleep apnea)    a.) does not use nocturnal PAP therapy   Psoriasis    Psoriatic arthritis (HCC)    a.) severe; Tx'd with MTX + secukinumab    PVC (premature ventricular contraction)    Schatzki's ring    Type II diabetes mellitus (HCC)    Past Surgical History:  Procedure Laterality  Date   ANTERIOR CERVICAL DECOMP/DISCECTOMY FUSION N/A 04/13/2021   Procedure: C3-5 ANTERIOR CERVICAL DISCECTOMY AND FUSION (GLOBUS ALLOGRAFT);  Surgeon: Clois Fret, MD;  Location: [facility] ORS;  Service: Neurosurgery;  Laterality: N/A;   ARTHROPLASTY     right thumb; left thumb with 2 screws and plastic joint   BALLOON DILATION N/A 05/29/2015   Procedure: BALLOON DILATION;  Surgeon: Gladis RAYMOND Mariner, MD;  Location: [facility] ENDOSCOPY;  Service: Endoscopy;  Laterality: N/A;   BALLOON DILATION N/A 02/14/2017   Procedure: BALLOON DILATION;  Surgeon: Mariner Gladis RAYMOND, MD;  Location: [facility] ENDOSCOPY;  Service: Endoscopy;  Laterality: N/A;   BILATERAL CARPAL TUNNEL RELEASE Bilateral 07/04/2017   Procedure: BILATERAL CARPAL TUNNEL RELEASE;  Surgeon: Maryl Barters, MD;  Location: [facility] ORS;  Service: Orthopedics;  Laterality: Bilateral;   BIOPSY  08/05/2022   Procedure: BIOPSY;  Surgeon: Maryruth Ole DASEN, MD;  Location: [facility] ENDOSCOPY;  Service: Endoscopy;;   BLEPHAROPLASTY     BUNIONECTOMY Left 12/2020   CARDIAC CATHETERIZATION     1 stent    CARPAL TUNNEL RELEASE  2005   right   CHOLECYSTECTOMY  2008   COLONOSCOPY     COLONOSCOPY WITH PROPOFOL  N/A 02/13/2018   Procedure: COLONOSCOPY WITH PROPOFOL ;  Surgeon: Gaylyn Gladis PENNER, MD;  Location: [facility] ENDOSCOPY;  Service: Endoscopy;  Laterality: N/A;   COLONOSCOPY WITH PROPOFOL  N/A 08/05/2022   Procedure: COLONOSCOPY WITH PROPOFOL ;  Surgeon: Maryruth Ole DASEN, MD;  Location: [facility] ENDOSCOPY;  Service: Endoscopy;  Laterality: N/A;   CORONARY STENT INTERVENTION N/A 06/14/2016   Procedure: Coronary Stent Intervention;  Surgeon: Darron Deatrice LABOR, MD;  Location: [facility] INVASIVE CV LAB;  Service: Cardiovascular;  Laterality: N/A;   CYST EXCISION  04/14/2015   tendon sheath cyst excision; right ring finger cyst removed and trigger finger realease   ESOPHAGOGASTRODUODENOSCOPY     ESOPHAGOGASTRODUODENOSCOPY (EGD) WITH PROPOFOL  N/A 05/29/2015    Procedure: ESOPHAGOGASTRODUODENOSCOPY (EGD) WITH PROPOFOL ;  Surgeon: Gladis PENNER Gaylyn, MD;  Location: [facility] ENDOSCOPY;  Service: Endoscopy;  Laterality: N/A;   ESOPHAGOGASTRODUODENOSCOPY (EGD) WITH PROPOFOL  N/A 07/04/2015   Procedure: ESOPHAGOGASTRODUODENOSCOPY (EGD) WITH PROPOFOL ;  Surgeon: Gladis PENNER Gaylyn, MD;  Location: [facility] ENDOSCOPY;  Service: Endoscopy;  Laterality: N/A;   ESOPHAGOGASTRODUODENOSCOPY (EGD) WITH PROPOFOL  N/A 01/04/2016   Procedure: ESOPHAGOGASTRODUODENOSCOPY (EGD) WITH PROPOFOL ;  Surgeon: Gladis PENNER Gaylyn, MD;  Location: [facility] ENDOSCOPY;  Service: Endoscopy;  Laterality: N/A;   ESOPHAGOGASTRODUODENOSCOPY (EGD) WITH PROPOFOL  N/A 03/22/2016   Procedure: ESOPHAGOGASTRODUODENOSCOPY (EGD) WITH PROPOFOL ;  Surgeon: Gladis PENNER Gaylyn, MD;  Location: [facility] ENDOSCOPY;  Service: Endoscopy;  Laterality: N/A;   ESOPHAGOGASTRODUODENOSCOPY (EGD) WITH PROPOFOL  N/A 02/14/2017   Procedure: ESOPHAGOGASTRODUODENOSCOPY (EGD) WITH PROPOFOL ;  Surgeon: Gaylyn Gladis PENNER, MD;  Location: [facility] ENDOSCOPY;  Service: Endoscopy;  Laterality: N/A;   ESOPHAGOGASTRODUODENOSCOPY (EGD) WITH PROPOFOL  N/A 04/11/2017   Procedure: ESOPHAGOGASTRODUODENOSCOPY (EGD) WITH PROPOFOL ;  Surgeon: Gaylyn Gladis PENNER, MD;  Location: [facility] ENDOSCOPY;  Service: Endoscopy;  Laterality: N/A;   ESOPHAGOGASTRODUODENOSCOPY (EGD) WITH PROPOFOL  N/A 07/07/2017   Procedure: ESOPHAGOGASTRODUODENOSCOPY (EGD) WITH PROPOFOL ;  Surgeon: Jinny Carmine, MD;  Location: [facility] ENDOSCOPY;  Service: Endoscopy;  Laterality: N/A;   ESOPHAGOGASTRODUODENOSCOPY (EGD) WITH PROPOFOL  N/A 02/13/2018   Procedure: ESOPHAGOGASTRODUODENOSCOPY (EGD) WITH PROPOFOL ;  Surgeon: Gaylyn Gladis PENNER, MD;  Location: [facility] ENDOSCOPY;  Service: Endoscopy;  Laterality: N/A;   ESOPHAGOGASTRODUODENOSCOPY (EGD) WITH PROPOFOL  N/A 08/05/2022   Procedure: ESOPHAGOGASTRODUODENOSCOPY (EGD) WITH PROPOFOL ;  Surgeon: Maryruth Ole DASEN, MD;  Location: [facility] ENDOSCOPY;  Service: Endoscopy;   Laterality: N/A;   esophagus repaiir   02/17/2023   Emergency surgery at [facility]   FOREIGN BODY REMOVAL N/A 12/03/2015   Procedure: FOREIGN BODY REMOVAL;  Surgeon: Jerrell Sol, MD;  Location: [facility] ENDOSCOPY;  Service: Endoscopy;  Laterality: N/A;   HERNIA REPAIR Right 1970   inguinal   HIATAL HERNIA REPAIR  07/2023   [facility]   INGUINAL HERNIA REPAIR Left 01/01/2018   Procedure: OPEN HERNIA REPAIR INGUINAL ADULT;  Surgeon: Desiderio Schanz, MD;  Location: [facility] ORS;  Service: General;  Laterality: Left;   LEFT HEART CATH Left 06/14/2016   Procedure: Left Heart Cath;  Surgeon: Darron Deatrice LABOR, MD;  Location: [facility] INVASIVE CV LAB;  Service: Cardiovascular;  Laterality: Left;   LEFT HEART CATH AND CORONARY ANGIOGRAPHY Left 09/14/2018   Procedure: LEFT HEART CATH  AND CORONARY ANGIOGRAPHY;  Surgeon: Darron Deatrice LABOR, MD;  Location: [facility] INVASIVE CV LAB;  Service: Cardiovascular;  Laterality: Left;   LUMBAR DISC SURGERY  2004   injections; herniated disc; percutaneous discectomy   NASAL SINUS SURGERY  2008   POLYPECTOMY  08/05/2022   Procedure: POLYPECTOMY;  Surgeon: Maryruth Ole DASEN, MD;  Location: [facility] ENDOSCOPY;  Service: Endoscopy;;   ROTATOR CUFF REPAIR Right    TONSILLECTOMY     Family History  Problem Relation Age of Onset   Lung cancer Father    Cancer Father    Hypertension Mother    Aneurysm Son    Social History   Socioeconomic History   Marital status: Married    Spouse name: Heron   Number of children: Not on file   Years of education: Not on file   Highest education level: Bachelor's degree (e.g., BA, AB, BS)  Occupational History    Comment: not heavy work  Tobacco Use   Smoking status: Former    Current packs/day: 0.00    Average packs/day: 0.3 packs/day for 7.0 years (1.8 ttl pk-yrs)    Types: Cigarettes    Start date: 07/06/1975    Quit date: 07/06/1982    Years since quitting: 41.3    Passive exposure: Past   Smokeless tobacco: Never  Vaping Use    Vaping status: Never Used  Substance and Sexual Activity   Alcohol use: Not Currently   Drug use: No   Sexual activity: Not on file  Other Topics Concern   Not on file  Social History Narrative   Lives locally with wife.  Active but does not routinely exercise.   Social Drivers of Corporate Investment Banker Strain: Low Risk  (11/12/2023)   Overall Financial Resource Strain (CARDIA)    Difficulty of Paying Living Expenses: Not hard at all  Food Insecurity: No Food Insecurity (11/12/2023)   Hunger Vital Sign    Worried About Running Out of Food in the Last Year: Never true    Ran Out of Food in the Last Year: Never true  Transportation Needs: No Transportation Needs (11/12/2023)   PRAPARE - Administrator, Civil Service (Medical): No    Lack of Transportation (Non-Medical): No  Physical Activity: Insufficiently Active (11/12/2023)   Exercise Vital Sign    Days of Exercise per Week: 4 days    Minutes of Exercise per Session: 20 min  Stress: No Stress Concern Present (11/12/2023)   [employer] of Occupational Health - Occupational Stress Questionnaire    Feeling of Stress: Not at all  Social Connections: Moderately Integrated (11/12/2023)   Social Connection and Isolation Panel    Frequency of Communication with Friends and Family: More than three times a week    Frequency of Social Gatherings with Friends and Family: More than three times a week    Attends Religious Services: More than 4 times per year    Active Member of [employer] or Organizations: No    Attends Banker Meetings: Never    Marital Status: Married    Tobacco Counseling Counseling given: Not Answered    Clinical Intake:  Pre-visit preparation completed: Yes  Pain : No/denies pain     BMI - recorded: 24.33 Nutritional Status: BMI of 19-24  Normal Nutritional Risks: None Diabetes: Yes CBG done?: No  Lab Results  Component Value Date   HGBA1C 6.5 09/12/2023   HGBA1C 6.9  08/01/2023   HGBA1C 9.7 (H) 12/03/2022  How often do you need to have someone help you when you read instructions, pamphlets, or other written materials from your doctor or pharmacy?: 1 - Never  Interpreter Needed?: No  Information entered by :: R. Aaliyana Fredericks LPN   Activities of Daily Living     11/12/2023    1:38 PM  In your present state of health, do you have any difficulty performing the following activities:  Hearing? 0  Vision? 0  Difficulty concentrating or making decisions? 0  Walking or climbing stairs? 0  Dressing or bathing? 0  Doing errands, shopping? 0  Preparing Food and eating ? N  Using the Toilet? N  In the past six months, have you accidently leaked urine? N  Do you have problems with loss of bowel control? N  Managing your Medications? N  Managing your Finances? N  Housekeeping or managing your Housekeeping? N    Patient Care Team: Glendia Shad, MD as PCP - General (Internal Medicine) Darron Deatrice LABOR, MD as PCP - Cardiology (Cardiology) Marea Selinda RAMAN, MD as Referring Physician (Vascular Surgery) Defoor, Elsie HERO, PA-C (Rheumatology)  I have updated your Care Teams any recent Medical Services you may have received from other providers in the past year.     Assessment:   This is a routine wellness examination for Shane Mcknight.  Hearing/Vision screen Hearing Screening - Comments:: No issues Vision Screening - Comments:: glasses   Goals Addressed             This Visit's Progress    Patient Stated       Wants to continue to stay active       Depression Screen     11/12/2023    1:47 PM 08/25/2023    3:36 PM 03/04/2022    1:12 PM 02/25/2022    3:42 PM 11/06/2021    3:22 PM 02/15/2021   12:38 PM 05/12/2020    4:14 PM  PHQ 2/9 Scores  PHQ - 2 Score 0 0 0 0 0 0 0  PHQ- 9 Score 1 0         Fall Risk     11/12/2023    1:40 PM 08/25/2023    3:35 PM 03/04/2022    1:15 PM 02/14/2022    3:43 PM 11/06/2021    3:22 PM  Fall Risk   Falls in the  past year? 0 0 0 0 0  Number falls in past yr: 0 0 0 0 0  Injury with Fall? 0 0 0 0 0  Risk for fall due to : No Fall Risks No Fall Risks  No Fall Risks No Fall Risks  Follow up Falls evaluation completed;Falls prevention discussed Falls evaluation completed Falls evaluation completed;Falls prevention discussed Falls evaluation completed Falls evaluation completed      Data saved with a previous flowsheet row definition    MEDICARE RISK AT HOME:  Medicare Risk at Home Any stairs in or around the home?: Yes If so, are there any without handrails?: No Home free of loose throw rugs in walkways, pet beds, electrical cords, etc?: Yes Adequate lighting in your home to reduce risk of falls?: Yes Life alert?: No Use of a cane, walker or w/c?: No Grab bars in the bathroom?: No Shower chair or bench in shower?: No Elevated toilet seat or a handicapped toilet?: No  TIMED UP AND GO:  Was the test performed?  No  Cognitive Function: 6CIT completed        11/12/2023  1:56 PM 03/04/2022    1:16 PM 02/12/2019   11:50 AM  6CIT Screen  What Year? 0 points 0 points 0 points  What month? 0 points 0 points 0 points  What time? 0 points 0 points 0 points  Count back from 20 0 points 0 points 0 points  Months in reverse 0 points 0 points 0 points  Repeat phrase 0 points 0 points 0 points  Total Score 0 points 0 points 0 points    Immunizations Immunization History  Administered Date(s) Administered   Janssen (J&J) SARS-COV-2 Vaccination 05/25/2019   Moderna Sars-Covid-2 Vaccination 11/08/2019   PNEUMOCOCCAL CONJUGATE-20 05/12/2020   Pneumococcal Polysaccharide-23 11/22/2016   Td 11/22/2016    Screening Tests Health Maintenance  Topic Date Due   Zoster Vaccines- Shingrix (1 of 2) Never done   Medicare Annual Wellness (AWV)  03/05/2023   OPHTHALMOLOGY EXAM  08/02/2023   COVID-19 Vaccine (3 - 2025-26 season) 09/15/2023   Influenza Vaccine  04/13/2024 (Originally 08/15/2023)   FOOT  EXAM  12/09/2023   HEMOGLOBIN A1C  03/13/2024   Diabetic kidney evaluation - eGFR measurement  09/11/2024   Diabetic kidney evaluation - Urine ACR  09/11/2024   DTaP/Tdap/Td (2 - Tdap) 11/23/2026   Colonoscopy  08/04/2032   Pneumococcal Vaccine: 50+ Years  Completed   Hepatitis C Screening  Completed   Meningococcal B Vaccine  Aged Out    Health Maintenance Items Addressed: Patient declines covid, flu and shingles vaccines at this time.  Additional Screening:  Vision Screening: Recommended annual ophthalmology exams for early detection of glaucoma and other disorders of the eye. Is the patient up to date with their annual eye exam?  Yes  Who is the provider or what is the name of the office in which the patient attends annual eye exams?  [facility]  Patient stated  he had an eye exam last week.  Will send for last office notes  Dental Screening: Recommended annual dental exams for proper oral hygiene  Community Resource Referral / Chronic Care Management: CRR required this visit?  No   CCM required this visit?  No   Plan:    I have personally reviewed and noted the following in the patient's chart:   Medical and social history Use of alcohol, tobacco or illicit drugs  Current medications and supplements including opioid prescriptions. Patient is not currently taking opioid prescriptions. Functional ability and status Nutritional status Physical activity Advanced directives List of other physicians Hospitalizations, surgeries, and ER visits in previous 12 months Vitals Screenings to include cognitive, depression, and falls Referrals and appointments  In addition, I have reviewed and discussed with patient certain preventive protocols, quality metrics, and best practice recommendations. A written personalized care plan for preventive services as well as general preventive health recommendations were provided to patient.   Angeline Fredericks, LPN   [id number]   After Visit  Summary: (MyChart) Due to this being a telephonic visit, the after visit summary with patients personalized plan was offered to patient via MyChart   Notes: Nothing significant to report at this time.

## 2023-11-12 NOTE — Patient Instructions (Signed)
 [name],  Thank you for taking the time for your Medicare Wellness Visit. I appreciate your continued commitment to your health goals. Please review the care plan we discussed, and feel free to reach out if I can assist you further.  Medicare recommends these wellness visits once per year to help you and your care team stay ahead of potential health issues. These visits are designed to focus on prevention, allowing your provider to concentrate on managing your acute and chronic conditions during your regular appointments.  Please note that Annual Wellness Visits do not include a physical exam. Some assessments may be limited, especially if the visit was conducted virtually. If needed, we may recommend a separate in-person follow-up with your provider.  Ongoing Care Seeing your primary care provider every 3 to 6 months helps us  monitor your health and provide consistent, personalized care.  Consider updating you vaccines.  Referrals If a referral was made during today's visit and you haven't received any updates within two weeks, please contact the referred provider directly to check on the status.  Recommended Screenings:  Health Maintenance  Topic Date Due   Zoster (Shingles) Vaccine (1 of 2) Never done   Eye exam for diabetics  08/02/2023   COVID-19 Vaccine (3 - 2025-26 season) 09/15/2023   Flu Shot  04/13/2024*   Complete foot exam   12/09/2023   Hemoglobin A1C  03/13/2024   Yearly kidney function blood test for diabetes  09/11/2024   Yearly kidney health urinalysis for diabetes  09/11/2024   Medicare Annual Wellness Visit  11/11/2024   DTaP/Tdap/Td vaccine (2 - Tdap) 11/23/2026   Colon Cancer Screening  08/04/2032   Pneumococcal Vaccine for age over 81  Completed   Hepatitis C Screening  Completed   Meningitis B Vaccine  Aged Out  *Topic was postponed. The date shown is not the original due date.       11/12/2023    1:56 PM  Advanced Directives  Does Patient Have a  Medical Advance Directive? No  Would patient like information on creating a medical advance directive? No - Patient declined   Advance Care Planning is important because it: Ensures you receive medical care that aligns with your values, goals, and preferences. Provides guidance to your family and loved ones, reducing the emotional burden of decision-making during critical moments.  Vision: Annual vision screenings are recommended for early detection of glaucoma, cataracts, and diabetic retinopathy. These exams can also reveal signs of chronic conditions such as diabetes and high blood pressure.  Dental: Annual dental screenings help detect early signs of oral cancer, gum disease, and other conditions linked to overall health, including heart disease and diabetes.  Please see the attached documents for additional preventive care recommendations.

## 2023-11-15 ENCOUNTER — Other Ambulatory Visit: Payer: Self-pay | Admitting: Internal Medicine

## 2023-11-15 DIAGNOSIS — E785 Hyperlipidemia, unspecified: Secondary | ICD-10-CM

## 2023-11-15 DIAGNOSIS — N183 Chronic kidney disease, stage 3 unspecified: Secondary | ICD-10-CM

## 2023-11-18 ENCOUNTER — Telehealth (INDEPENDENT_AMBULATORY_CARE_PROVIDER_SITE_OTHER): Payer: Self-pay

## 2023-11-18 NOTE — Telephone Encounter (Signed)
 Patient called leaving messages regarding being scheduled to have his carotid stent placement with Dr. Marea. I left a message to let him know I was working on a new prior auth for his procedure.

## 2023-11-20 ENCOUNTER — Encounter: Payer: Self-pay | Admitting: Pharmacist

## 2023-11-20 NOTE — Progress Notes (Signed)
 Pharmacy Quality Measure Review  This patient is appearing on a report for being at risk of failing the adherence measure for diabetes medications this calendar year.   Medication: glipizide  ER 5 mg Last fill date: 09/05/23 for 30 day supply  No longer prescribed this medication. No further action.

## 2023-12-04 ENCOUNTER — Encounter: Admission: RE | Disposition: A | Payer: Self-pay | Source: Home / Self Care | Attending: Vascular Surgery

## 2023-12-04 ENCOUNTER — Encounter: Payer: Self-pay | Admitting: Vascular Surgery

## 2023-12-04 ENCOUNTER — Inpatient Hospital Stay
Admission: RE | Admit: 2023-12-04 | Discharge: 2023-12-05 | DRG: 035 | Disposition: A | Discharge status: Home / Self Care | Attending: Vascular Surgery | Admitting: Vascular Surgery

## 2023-12-04 ENCOUNTER — Other Ambulatory Visit: Payer: Self-pay | Admitting: Internal Medicine

## 2023-12-04 ENCOUNTER — Other Ambulatory Visit: Payer: Self-pay

## 2023-12-04 DIAGNOSIS — Z9889 Other specified postprocedural states: Secondary | ICD-10-CM | POA: Diagnosis not present

## 2023-12-04 DIAGNOSIS — Z7902 Long term (current) use of antithrombotics/antiplatelets: Secondary | ICD-10-CM

## 2023-12-04 DIAGNOSIS — Z886 Allergy status to analgesic agent status: Secondary | ICD-10-CM | POA: Diagnosis not present

## 2023-12-04 DIAGNOSIS — I6521 Occlusion and stenosis of right carotid artery: Secondary | ICD-10-CM | POA: Diagnosis present

## 2023-12-04 DIAGNOSIS — E1122 Type 2 diabetes mellitus with diabetic chronic kidney disease: Secondary | ICD-10-CM | POA: Diagnosis present

## 2023-12-04 DIAGNOSIS — Z91013 Allergy to seafood: Secondary | ICD-10-CM | POA: Diagnosis not present

## 2023-12-04 DIAGNOSIS — Z860100 Personal history of colon polyps, unspecified: Secondary | ICD-10-CM | POA: Diagnosis not present

## 2023-12-04 DIAGNOSIS — Z880 Allergy status to penicillin: Secondary | ICD-10-CM

## 2023-12-04 DIAGNOSIS — K219 Gastro-esophageal reflux disease without esophagitis: Secondary | ICD-10-CM | POA: Diagnosis present

## 2023-12-04 DIAGNOSIS — I2510 Atherosclerotic heart disease of native coronary artery without angina pectoris: Secondary | ICD-10-CM | POA: Diagnosis present

## 2023-12-04 DIAGNOSIS — N183 Chronic kidney disease, stage 3 unspecified: Secondary | ICD-10-CM | POA: Diagnosis present

## 2023-12-04 DIAGNOSIS — Z955 Presence of coronary angioplasty implant and graft: Secondary | ICD-10-CM

## 2023-12-04 DIAGNOSIS — Z881 Allergy status to other antibiotic agents status: Secondary | ICD-10-CM

## 2023-12-04 DIAGNOSIS — Z8249 Family history of ischemic heart disease and other diseases of the circulatory system: Secondary | ICD-10-CM | POA: Diagnosis not present

## 2023-12-04 DIAGNOSIS — L4050 Arthropathic psoriasis, unspecified: Secondary | ICD-10-CM | POA: Diagnosis present

## 2023-12-04 DIAGNOSIS — Z981 Arthrodesis status: Secondary | ICD-10-CM | POA: Diagnosis not present

## 2023-12-04 DIAGNOSIS — Z7982 Long term (current) use of aspirin: Secondary | ICD-10-CM | POA: Diagnosis not present

## 2023-12-04 DIAGNOSIS — Z79899 Other long term (current) drug therapy: Secondary | ICD-10-CM

## 2023-12-04 DIAGNOSIS — K760 Fatty (change of) liver, not elsewhere classified: Secondary | ICD-10-CM | POA: Diagnosis present

## 2023-12-04 DIAGNOSIS — Z7960 Long term (current) use of unspecified immunomodulators and immunosuppressants: Secondary | ICD-10-CM | POA: Diagnosis not present

## 2023-12-04 DIAGNOSIS — Z79631 Long term (current) use of antimetabolite agent: Secondary | ICD-10-CM

## 2023-12-04 DIAGNOSIS — D84821 Immunodeficiency due to drugs: Secondary | ICD-10-CM | POA: Diagnosis present

## 2023-12-04 DIAGNOSIS — Z87891 Personal history of nicotine dependence: Secondary | ICD-10-CM | POA: Diagnosis not present

## 2023-12-04 DIAGNOSIS — I129 Hypertensive chronic kidney disease with stage 1 through stage 4 chronic kidney disease, or unspecified chronic kidney disease: Secondary | ICD-10-CM | POA: Diagnosis present

## 2023-12-04 DIAGNOSIS — Z7984 Long term (current) use of oral hypoglycemic drugs: Secondary | ICD-10-CM

## 2023-12-04 DIAGNOSIS — Z8616 Personal history of COVID-19: Secondary | ICD-10-CM

## 2023-12-04 DIAGNOSIS — Z801 Family history of malignant neoplasm of trachea, bronchus and lung: Secondary | ICD-10-CM

## 2023-12-04 DIAGNOSIS — E7800 Pure hypercholesterolemia, unspecified: Secondary | ICD-10-CM | POA: Diagnosis present

## 2023-12-04 DIAGNOSIS — Z888 Allergy status to other drugs, medicaments and biological substances status: Secondary | ICD-10-CM | POA: Diagnosis not present

## 2023-12-04 DIAGNOSIS — G4733 Obstructive sleep apnea (adult) (pediatric): Secondary | ICD-10-CM | POA: Diagnosis present

## 2023-12-04 DIAGNOSIS — Z91048 Other nonmedicinal substance allergy status: Secondary | ICD-10-CM

## 2023-12-04 DIAGNOSIS — R001 Bradycardia, unspecified: Secondary | ICD-10-CM | POA: Diagnosis not present

## 2023-12-04 HISTORY — PX: CAROTID PTA/STENT INTERVENTION: CATH118231

## 2023-12-04 LAB — MRSA NEXT GEN BY PCR, NASAL: MRSA by PCR Next Gen: NOT DETECTED

## 2023-12-04 LAB — CREATININE, SERUM
Creatinine, Ser: 0.75 mg/dL (ref 0.61–1.24)
GFR, Estimated: >60 mL/min (ref >60)

## 2023-12-04 LAB — GLUCOSE, CAPILLARY
Glucose-Capillary: 131 mg/dL — ABNORMAL HIGH (ref 70–99)
Glucose-Capillary: 80 mg/dL (ref 70–99)

## 2023-12-04 LAB — BUN: BUN: 11 mg/dL (ref 8–23)

## 2023-12-04 SURGERY — CAROTID PTA/STENT INTERVENTION
Anesthesia: Moderate Sedation | Laterality: Right

## 2023-12-04 MED ORDER — CEFAZOLIN SODIUM-DEXTROSE 2-4 GM/100ML-% IV SOLN
2.0000 g | INTRAVENOUS | Status: AC
  Administered 2023-12-04: 2 g via INTRAVENOUS

## 2023-12-04 MED ORDER — ASPIRIN 81 MG PO TBEC
81.0000 mg | DELAYED_RELEASE_TABLET | Freq: Every evening | ORAL | Status: DC
  Administered 2023-12-04: 81 mg via ORAL
  Filled 2023-12-04: qty 1

## 2023-12-04 MED ORDER — IODIXANOL 320 MG/ML IV SOLN
INTRAVENOUS | Status: DC | PRN
  Administered 2023-12-04: 55 mL via INTRA_ARTERIAL

## 2023-12-04 MED ORDER — ONDANSETRON HCL 4 MG/2ML IJ SOLN
4.0000 mg | Freq: Four times a day (QID) | INTRAMUSCULAR | Status: DC | PRN
  Administered 2023-12-04: 4 mg via INTRAVENOUS
  Filled 2023-12-04: qty 2

## 2023-12-04 MED ORDER — SODIUM CHLORIDE 0.9 % IV SOLN
500.0000 mL | Freq: Once | INTRAVENOUS | Status: AC | PRN
  Administered 2023-12-04: 500 mL via INTRAVENOUS

## 2023-12-04 MED ORDER — ATROPINE SULFATE 1 MG/ML IV SOLN
INTRAVENOUS | Status: DC | PRN
  Administered 2023-12-04: 1 mg via INTRAVENOUS

## 2023-12-04 MED ORDER — ATENOLOL 50 MG PO TABS
50.0000 mg | ORAL_TABLET | Freq: Two times a day (BID) | ORAL | Status: DC
  Filled 2023-12-04: qty 1

## 2023-12-04 MED ORDER — PHENYLEPHRINE 80 MCG/ML (10ML) SYRINGE FOR IV PUSH (FOR BLOOD PRESSURE SUPPORT)
PREFILLED_SYRINGE | INTRAVENOUS | Status: AC
  Filled 2023-12-04: qty 10

## 2023-12-04 MED ORDER — ATROPINE SULFATE 1 MG/10ML IJ SOSY
PREFILLED_SYRINGE | INTRAMUSCULAR | Status: AC
  Filled 2023-12-04: qty 20

## 2023-12-04 MED ORDER — METHYLPREDNISOLONE SODIUM SUCC 125 MG IJ SOLR
INTRAMUSCULAR | Status: DC
Start: 2023-12-04 — End: 2023-12-04
  Filled 2023-12-04: qty 2

## 2023-12-04 MED ORDER — FAMOTIDINE 20 MG PO TABS: 40.0000 mg | ORAL_TABLET | Freq: Once | ORAL | Status: DC | PRN

## 2023-12-04 MED ORDER — DIPHENHYDRAMINE HCL 50 MG/ML IJ SOLN
INTRAMUSCULAR | Status: AC
  Filled 2023-12-04: qty 1

## 2023-12-04 MED ORDER — ROSUVASTATIN CALCIUM 20 MG PO TABS
20.0000 mg | ORAL_TABLET | Freq: Every day | ORAL | Status: DC
  Administered 2023-12-04: 20 mg via ORAL
  Filled 2023-12-04: qty 1

## 2023-12-04 MED ORDER — MIDAZOLAM HCL 2 MG/ML PO SYRP: 8.0000 mg | ORAL_SOLUTION | Freq: Once | ORAL | Status: DC | PRN

## 2023-12-04 MED ORDER — ORAL CARE MOUTH RINSE
15.0000 mL | OROMUCOSAL | Status: DC | PRN
Start: 2023-12-04 — End: 2023-12-05

## 2023-12-04 MED ORDER — METHYLPREDNISOLONE SODIUM SUCC 125 MG IJ SOLR: 125.0000 mg | Freq: Once | INTRAMUSCULAR | Status: DC | PRN

## 2023-12-04 MED ORDER — CEFAZOLIN SODIUM-DEXTROSE 2-4 GM/100ML-% IV SOLN
INTRAVENOUS | Status: AC
Start: 2023-12-04 — End: 2023-12-04
  Filled 2023-12-04: qty 100

## 2023-12-04 MED ORDER — SODIUM CHLORIDE 0.9 % IV BOLUS
500.0000 mL | Freq: Once | INTRAVENOUS | Status: AC
  Administered 2023-12-04: 500 mL via INTRAVENOUS

## 2023-12-04 MED ORDER — SODIUM CHLORIDE 0.9 % IV SOLN: INTRAVENOUS | Status: DC

## 2023-12-04 MED ORDER — POTASSIUM CHLORIDE CRYS ER 20 MEQ PO TBCR: 40.0000 meq | EXTENDED_RELEASE_TABLET | Freq: Every day | ORAL | Status: DC | PRN

## 2023-12-04 MED ORDER — LOSARTAN POTASSIUM 25 MG PO TABS: 12.5000 mg | ORAL_TABLET | Freq: Every day | ORAL | Status: DC

## 2023-12-04 MED ORDER — DIPHENHYDRAMINE HCL 50 MG/ML IJ SOLN: 50.0000 mg | Freq: Once | INTRAMUSCULAR | Status: DC | PRN

## 2023-12-04 MED ORDER — ONDANSETRON HCL 4 MG/2ML IJ SOLN: 4.0000 mg | Freq: Four times a day (QID) | INTRAMUSCULAR | Status: DC | PRN

## 2023-12-04 MED ORDER — SODIUM CHLORIDE 0.9 % IV BOLUS
250.0000 mL | Freq: Once | INTRAVENOUS | Status: AC
  Administered 2023-12-04: 250 mL via INTRAVENOUS

## 2023-12-04 MED ORDER — FENTANYL CITRATE (PF) 100 MCG/2ML IJ SOLN
INTRAMUSCULAR | Status: AC
  Filled 2023-12-04: qty 2

## 2023-12-04 MED ORDER — DOPAMINE-DEXTROSE 3.2-5 MG/ML-% IV SOLN
INTRAVENOUS | Status: AC
  Filled 2023-12-04: qty 250

## 2023-12-04 MED ORDER — ISOSORBIDE MONONITRATE ER 30 MG PO TB24
15.0000 mg | ORAL_TABLET | Freq: Every day | ORAL | Status: DC
  Administered 2023-12-05: 15 mg via ORAL
  Filled 2023-12-04: qty 1

## 2023-12-04 MED ORDER — DOPAMINE-DEXTROSE 3.2-5 MG/ML-% IV SOLN
0.0000 ug/kg/min | INTRAVENOUS | Status: DC
  Administered 2023-12-04: 5 ug/kg/min via INTRAVENOUS
  Filled 2023-12-04: qty 250

## 2023-12-04 MED ORDER — VITAMIN E 45 MG (100 UNIT) PO CAPS
400.0000 [IU] | ORAL_CAPSULE | Freq: Every day | ORAL | Status: DC
  Administered 2023-12-05: 400 [IU] via ORAL
  Filled 2023-12-04: qty 4

## 2023-12-04 MED ORDER — CEFAZOLIN SODIUM-DEXTROSE 2-4 GM/100ML-% IV SOLN
2.0000 g | Freq: Three times a day (TID) | INTRAVENOUS | Status: AC
  Administered 2023-12-04 – 2023-12-05 (×2): 2 g via INTRAVENOUS
  Filled 2023-12-04 (×3): qty 100

## 2023-12-04 MED ORDER — LABETALOL HCL 5 MG/ML IV SOLN: 10.0000 mg | INTRAVENOUS | Status: DC | PRN

## 2023-12-04 MED ORDER — MIDAZOLAM HCL (PF) 2 MG/2ML IJ SOLN
INTRAMUSCULAR | Status: DC | PRN
  Administered 2023-12-04: 2 mg via INTRAVENOUS

## 2023-12-04 MED ORDER — ACETAMINOPHEN 325 MG PO TABS: 325.0000 mg | ORAL_TABLET | ORAL | Status: DC | PRN

## 2023-12-04 MED ORDER — FAMOTIDINE 20 MG PO TABS
ORAL_TABLET | ORAL | Status: AC
  Filled 2023-12-04: qty 2

## 2023-12-04 MED ORDER — LIDOCAINE-EPINEPHRINE (PF) 1 %-1:200000 IJ SOLN
INTRAMUSCULAR | Status: DC | PRN
  Administered 2023-12-04: 10 mL

## 2023-12-04 MED ORDER — FOLIC ACID 1 MG PO TABS
1.0000 mg | ORAL_TABLET | Freq: Every day | ORAL | Status: DC
  Administered 2023-12-05: 1 mg via ORAL
  Filled 2023-12-04: qty 1

## 2023-12-04 MED ORDER — FENTANYL CITRATE (PF) 100 MCG/2ML IJ SOLN
INTRAMUSCULAR | Status: DC | PRN
  Administered 2023-12-04: 50 ug via INTRAVENOUS

## 2023-12-04 MED ORDER — HEPARIN SODIUM (PORCINE) 1000 UNIT/ML IJ SOLN
INTRAMUSCULAR | Status: AC
  Filled 2023-12-04: qty 10

## 2023-12-04 MED ORDER — CHLORHEXIDINE GLUCONATE CLOTH 2 % EX PADS
6.0000 | MEDICATED_PAD | Freq: Every day | CUTANEOUS | Status: DC
  Administered 2023-12-04: 6 via TOPICAL

## 2023-12-04 MED ORDER — PHENYLEPHRINE HCL-NACL 20-0.9 MG/250ML-% IV SOLN
INTRAVENOUS | Status: AC
  Filled 2023-12-04: qty 250

## 2023-12-04 MED ORDER — METOPROLOL TARTRATE 5 MG/5ML IV SOLN: 2.5000 mg | INTRAVENOUS | Status: DC | PRN

## 2023-12-04 MED ORDER — MIDAZOLAM HCL 2 MG/2ML IJ SOLN
INTRAMUSCULAR | Status: AC
  Filled 2023-12-04: qty 4

## 2023-12-04 MED ORDER — HEPARIN SODIUM (PORCINE) 1000 UNIT/ML IJ SOLN
INTRAMUSCULAR | Status: DC | PRN
  Administered 2023-12-04: 2000 [IU] via INTRAVENOUS
  Administered 2023-12-04: 8000 [IU] via INTRAVENOUS

## 2023-12-04 MED ORDER — MORPHINE SULFATE (PF) 2 MG/ML IV SOLN: 2.0000 mg | INTRAVENOUS | Status: DC | PRN

## 2023-12-04 MED ORDER — HYDROMORPHONE HCL 1 MG/ML IJ SOLN: 1.0000 mg | Freq: Once | INTRAMUSCULAR | Status: DC | PRN

## 2023-12-04 MED ORDER — PHENOL 1.4 % MT LIQD
1.0000 | OROMUCOSAL | Status: DC | PRN
Start: 2023-12-04 — End: 2023-12-05

## 2023-12-04 MED ORDER — NITROGLYCERIN 0.4 MG SL SUBL: 0.4000 mg | SUBLINGUAL_TABLET | SUBLINGUAL | Status: DC | PRN

## 2023-12-04 MED ORDER — GABAPENTIN 100 MG PO CAPS: 100.0000 mg | ORAL_CAPSULE | Freq: Every day | ORAL | Status: DC

## 2023-12-04 MED ORDER — ACETAMINOPHEN 325 MG RE SUPP: 325.0000 mg | RECTAL | Status: DC | PRN

## 2023-12-04 MED ORDER — CLOPIDOGREL BISULFATE 75 MG PO TABS
75.0000 mg | ORAL_TABLET | Freq: Every day | ORAL | Status: DC
  Administered 2023-12-04 – 2023-12-05 (×2): 75 mg via ORAL
  Filled 2023-12-04 (×2): qty 1

## 2023-12-04 MED ORDER — HEPARIN (PORCINE) IN NACL 1000-0.9 UT/500ML-% IV SOLN
INTRAVENOUS | Status: DC | PRN
  Administered 2023-12-04: 3000 mL

## 2023-12-04 MED ORDER — HYDRALAZINE HCL 20 MG/ML IJ SOLN: 5.0000 mg | INTRAMUSCULAR | Status: DC | PRN

## 2023-12-04 SURGICAL SUPPLY — 18 items
BALLOON VIATRAC 5X30X135 (BALLOONS) IMPLANT
BALLOON VTRAC 4.5X30X135 (BALLOONS) IMPLANT
CATH ANGIO 5F 100CM .035 PIG (CATHETERS) IMPLANT
CATH BEACON 5 .035 100 H1 TIP (CATHETERS) IMPLANT
COVER PROBE ULTRASOUND 5X96 (MISCELLANEOUS) IMPLANT
DEVICE EMBOSHIELD NAV6 4.0-7.0 (FILTER) IMPLANT
DEVICE PRESTO INFLATION (MISCELLANEOUS) IMPLANT
DEVICE STARCLOSE SE CLOSURE (Vascular Products) IMPLANT
GLIDEWIRE ANGLED SS 035X260CM (WIRE) IMPLANT
KIT CAROTID MANIFOLD (MISCELLANEOUS) IMPLANT
PACK ANGIOGRAPHY (CUSTOM PROCEDURE TRAY) ×1 IMPLANT
SHEATH BRITE TIP 6FRX11 (SHEATH) IMPLANT
SHEATH SHUTTLE 6FRX80 (SHEATH) IMPLANT
STENT XACT CAR 9-7X40X136 (Permanent Stent) IMPLANT
SUT MNCRL AB 4-0 PS2 18 (SUTURE) IMPLANT
SYR MEDRAD MARK 7 150ML (SYRINGE) IMPLANT
WIRE G VAS 035X260 STIFF (WIRE) IMPLANT
WIRE J 3MM .035X145CM (WIRE) IMPLANT

## 2023-12-04 NOTE — Plan of Care (Signed)
  Problem: Education: Goal: Knowledge of discharge needs will improve Outcome: Progressing   Problem: Clinical Measurements: Goal: Postoperative complications will be avoided or minimized Outcome: Progressing   Problem: Respiratory: Goal: Will achieve and/or maintain a regular respiratory rate, without signs or symptoms of dyspnea Outcome: Progressing   Problem: Skin Integrity: Goal: Demonstration of wound healing without infection will improve Outcome: Progressing   Problem: Clinical Measurements: Goal: Diagnostic test results will improve Outcome: Progressing Goal: Respiratory complications will improve Outcome: Progressing Goal: Cardiovascular complication will be avoided Outcome: Progressing   Problem: Coping: Goal: Level of anxiety will decrease Outcome: Progressing   Problem: Elimination: Goal: Will not experience complications related to bowel motility Outcome: Progressing Goal: Will not experience complications related to urinary retention Outcome: Progressing   Problem: Safety: Goal: Ability to remain free from injury will improve Outcome: Progressing   Problem: Skin Integrity: Goal: Risk for impaired skin integrity will decrease Outcome: Progressing

## 2023-12-04 NOTE — Progress Notes (Signed)
 Patient on dopamine at 5 mcg at the beginning of shift, had to increase to 10 mcg due to hypotension. After increase patient started to complain of nausea, zofran  given with no change in symptoms. Also exhibiting symptoms of urinary retention. Messaged Dr. Marea to report symptoms. Per Dr. Marea, turn off dopamine drip.  Dopamine drip turned off at 2028.

## 2023-12-04 NOTE — H&P (Signed)
 [facility] Admission History & Physical  MRN : [id number]  Shane Mcknight is a 73 y.o. (03/25/1950) male who presents with chief complaint of No chief complaint on file. SABRA  History of Present Illness: Patient presents today for planned carotid intervention.  No focal neurologic symptoms since his last office visit about 2 months ago.  I have independently reviewed his CT angiogram of the carotid arteries.  The official report is of an 80% right ICA stenosis and I would agree that it is at least that bad.  The left carotid stenosis is mild and the 30 to 40% range.  There is some vertebral artery and intracranial disease that appears mild to moderate in nature.      Current Facility-Administered Medications  Medication Dose Route Frequency Provider Last Rate Last Admin   0.9 %  sodium chloride  infusion   Intravenous Continuous Brown, Fallon E, NP       ceFAZolin  (ANCEF ) IVPB 2g/100 mL premix  2 g Intravenous 30 min Pre-Op Brown, Fallon E, NP       diphenhydrAMINE (BENADRYL) injection 50 mg  50 mg Intravenous Once PRN Brown, Fallon E, NP       famotidine (PEPCID) tablet 40 mg  40 mg Oral Once PRN Brown, Fallon E, NP       HYDROmorphone  (DILAUDID ) injection 1 mg  1 mg Intravenous Once PRN Brown, Fallon E, NP       methylPREDNISolone sodium succinate (SOLU-MEDROL) 125 mg/2 mL injection 125 mg  125 mg Intravenous Once PRN Brown, Fallon E, NP       midazolam  (VERSED ) 2 MG/ML syrup 8 mg  8 mg Oral Once PRN Brown, Fallon E, NP       ondansetron  (ZOFRAN ) injection 4 mg  4 mg Intravenous Q6H PRN Brown, Fallon E, NP        Past Medical History:  Diagnosis Date   Allergic state    Anemia    Aortic atherosclerosis    Arthritis    spine, hands, shoulder with previous cuff tear   B12 deficiency    CAD (coronary artery disease)    a.) 11/2015 CT: Coronary Ca2+ noted. b.) LHC 06/14/2016: 60% pRCA, 60% RPLB-2, 99% OM1, 50% mLAD, 40% oLM-LM, 30% pLAD, 60% oD1-D1, 60% oLCx, 95%  dRCA; PCI placing a 2.5 x 15mm Resolute Onyx DES dRCA. c.) LHC 09/14/2018: 40% oLM-LM, 50% mLAD, 30% pLAD, 60% oD1-D1, 40% oLCx, 100% OM1, 60% p-mRCA, 50% RPAV; dRCA stent widely patent; medical mgmt.   Candidiasis of esophagus (HCC)    Carotid artery stenosis 04/17/2020   a.) Carotid doppler 04/17/2020: 40-59% RICA/LICA stenosis.   Colon polyp    COVID-19    09/2021   DDD (degenerative disc disease), lumbosacral    Diastolic dysfunction 04/06/2020   a.)  TTE 04/06/2020; EF 55-60%; mild LA dilation; no RWMAs; G2DD.   Dysphagia    Effort angina    Elevated PSA    Elevated transaminase level    Esophageal stricture    a.) s/p multiple dilations   Esophagitis, [place] grade D    Fatty liver    Ganglion cyst of finger of right hand    GERD (gastroesophageal reflux disease)    Guaiac positive stools    Hiatal hernia    HNP (herniated nucleus pulposus), lumbar    Hypercholesterolemia    Hypertension    Long term current use of immunosuppressive drug    a.) MTX + secukinumab  for severe psoriatic arthritis  Lumbar radiculitis    Mallory-Weiss tear 11/2015   Migraine aura without headache    OSA (obstructive sleep apnea)    a.) does not use nocturnal PAP therapy   Psoriasis    Psoriatic arthritis (HCC)    a.) severe; Tx'd with MTX + secukinumab    PVC (premature ventricular contraction)    Schatzki's ring    Type II diabetes mellitus (HCC)     Past Surgical History:  Procedure Laterality Date   ANTERIOR CERVICAL DECOMP/DISCECTOMY FUSION N/A 04/13/2021   Procedure: C3-5 ANTERIOR CERVICAL DISCECTOMY AND FUSION (GLOBUS ALLOGRAFT);  Surgeon: Clois Fret, MD;  Location: [facility] ORS;  Service: Neurosurgery;  Laterality: N/A;   ARTHROPLASTY     right thumb; left thumb with 2 screws and plastic joint   BALLOON DILATION N/A 05/29/2015   Procedure: BALLOON DILATION;  Surgeon: Gladis RAYMOND Mariner, MD;  Location: [facility] ENDOSCOPY;  Service: Endoscopy;  Laterality: N/A;   BALLOON  DILATION N/A 02/14/2017   Procedure: BALLOON DILATION;  Surgeon: Mariner Gladis RAYMOND, MD;  Location: [facility] ENDOSCOPY;  Service: Endoscopy;  Laterality: N/A;   BILATERAL CARPAL TUNNEL RELEASE Bilateral 07/04/2017   Procedure: BILATERAL CARPAL TUNNEL RELEASE;  Surgeon: Maryl Barters, MD;  Location: [facility] ORS;  Service: Orthopedics;  Laterality: Bilateral;   BIOPSY  08/05/2022   Procedure: BIOPSY;  Surgeon: Maryruth Ole DASEN, MD;  Location: [facility] ENDOSCOPY;  Service: Endoscopy;;   BLEPHAROPLASTY     BUNIONECTOMY Left 12/2020   CARDIAC CATHETERIZATION     1 stent    CARPAL TUNNEL RELEASE  2005   right   CHOLECYSTECTOMY  2008   COLONOSCOPY     COLONOSCOPY WITH PROPOFOL  N/A 02/13/2018   Procedure: COLONOSCOPY WITH PROPOFOL ;  Surgeon: Mariner Gladis RAYMOND, MD;  Location: [facility] ENDOSCOPY;  Service: Endoscopy;  Laterality: N/A;   COLONOSCOPY WITH PROPOFOL  N/A 08/05/2022   Procedure: COLONOSCOPY WITH PROPOFOL ;  Surgeon: Maryruth Ole DASEN, MD;  Location: [facility] ENDOSCOPY;  Service: Endoscopy;  Laterality: N/A;   CORONARY STENT INTERVENTION N/A 06/14/2016   Procedure: Coronary Stent Intervention;  Surgeon: Darron Deatrice LABOR, MD;  Location: [facility] INVASIVE CV LAB;  Service: Cardiovascular;  Laterality: N/A;   CYST EXCISION  04/14/2015   tendon sheath cyst excision; right ring finger cyst removed and trigger finger realease   ESOPHAGOGASTRODUODENOSCOPY     ESOPHAGOGASTRODUODENOSCOPY (EGD) WITH PROPOFOL  N/A 05/29/2015   Procedure: ESOPHAGOGASTRODUODENOSCOPY (EGD) WITH PROPOFOL ;  Surgeon: Gladis RAYMOND Mariner, MD;  Location: [facility] ENDOSCOPY;  Service: Endoscopy;  Laterality: N/A;   ESOPHAGOGASTRODUODENOSCOPY (EGD) WITH PROPOFOL  N/A 07/04/2015   Procedure: ESOPHAGOGASTRODUODENOSCOPY (EGD) WITH PROPOFOL ;  Surgeon: Gladis RAYMOND Mariner, MD;  Location: [facility] ENDOSCOPY;  Service: Endoscopy;  Laterality: N/A;   ESOPHAGOGASTRODUODENOSCOPY (EGD) WITH PROPOFOL  N/A 01/04/2016   Procedure: ESOPHAGOGASTRODUODENOSCOPY (EGD) WITH  PROPOFOL ;  Surgeon: Gladis RAYMOND Mariner, MD;  Location: [facility] ENDOSCOPY;  Service: Endoscopy;  Laterality: N/A;   ESOPHAGOGASTRODUODENOSCOPY (EGD) WITH PROPOFOL  N/A 03/22/2016   Procedure: ESOPHAGOGASTRODUODENOSCOPY (EGD) WITH PROPOFOL ;  Surgeon: Gladis RAYMOND Mariner, MD;  Location: [facility] ENDOSCOPY;  Service: Endoscopy;  Laterality: N/A;   ESOPHAGOGASTRODUODENOSCOPY (EGD) WITH PROPOFOL  N/A 02/14/2017   Procedure: ESOPHAGOGASTRODUODENOSCOPY (EGD) WITH PROPOFOL ;  Surgeon: Mariner Gladis RAYMOND, MD;  Location: [facility] ENDOSCOPY;  Service: Endoscopy;  Laterality: N/A;   ESOPHAGOGASTRODUODENOSCOPY (EGD) WITH PROPOFOL  N/A 04/11/2017   Procedure: ESOPHAGOGASTRODUODENOSCOPY (EGD) WITH PROPOFOL ;  Surgeon: Mariner Gladis RAYMOND, MD;  Location: [facility] ENDOSCOPY;  Service: Endoscopy;  Laterality: N/A;   ESOPHAGOGASTRODUODENOSCOPY (EGD) WITH PROPOFOL  N/A 07/07/2017   Procedure: ESOPHAGOGASTRODUODENOSCOPY (EGD) WITH PROPOFOL ;  Surgeon: Jinny Carmine,  MD;  Location: [facility] ENDOSCOPY;  Service: Endoscopy;  Laterality: N/A;   ESOPHAGOGASTRODUODENOSCOPY (EGD) WITH PROPOFOL  N/A 02/13/2018   Procedure: ESOPHAGOGASTRODUODENOSCOPY (EGD) WITH PROPOFOL ;  Surgeon: Gaylyn Gladis PENNER, MD;  Location: [facility] ENDOSCOPY;  Service: Endoscopy;  Laterality: N/A;   ESOPHAGOGASTRODUODENOSCOPY (EGD) WITH PROPOFOL  N/A 08/05/2022   Procedure: ESOPHAGOGASTRODUODENOSCOPY (EGD) WITH PROPOFOL ;  Surgeon: Maryruth Ole DASEN, MD;  Location: [facility] ENDOSCOPY;  Service: Endoscopy;  Laterality: N/A;   esophagus repaiir   02/17/2023   Emergency surgery at [facility]   FOREIGN BODY REMOVAL N/A 12/03/2015   Procedure: FOREIGN BODY REMOVAL;  Surgeon: Jerrell Sol, MD;  Location: [facility] ENDOSCOPY;  Service: Endoscopy;  Laterality: N/A;   HERNIA REPAIR Right 1970   inguinal   HIATAL HERNIA REPAIR  07/2023   [facility]   INGUINAL HERNIA REPAIR Left 01/01/2018   Procedure: OPEN HERNIA REPAIR INGUINAL ADULT;  Surgeon: Desiderio Schanz, MD;  Location: [facility] ORS;  Service: General;   Laterality: Left;   LEFT HEART CATH Left 06/14/2016   Procedure: Left Heart Cath;  Surgeon: Darron Deatrice LABOR, MD;  Location: [facility] INVASIVE CV LAB;  Service: Cardiovascular;  Laterality: Left;   LEFT HEART CATH AND CORONARY ANGIOGRAPHY Left 09/14/2018   Procedure: LEFT HEART CATH AND CORONARY ANGIOGRAPHY;  Surgeon: Darron Deatrice LABOR, MD;  Location: [facility] INVASIVE CV LAB;  Service: Cardiovascular;  Laterality: Left;   LUMBAR DISC SURGERY  2004   injections; herniated disc; percutaneous discectomy   NASAL SINUS SURGERY  2008   POLYPECTOMY  08/05/2022   Procedure: POLYPECTOMY;  Surgeon: Maryruth Ole DASEN, MD;  Location: [facility] ENDOSCOPY;  Service: Endoscopy;;   ROTATOR CUFF REPAIR Right    TONSILLECTOMY       Social History   Tobacco Use   Smoking status: Former    Current packs/day: 0.00    Average packs/day: 0.3 packs/day for 7.0 years (1.8 ttl pk-yrs)    Types: Cigarettes    Start date: 07/06/1975    Quit date: 07/06/1982    Years since quitting: 41.4    Passive exposure: Past   Smokeless tobacco: Never  Vaping Use   Vaping status: Never Used  Substance Use Topics   Alcohol use: Not Currently   Drug use: No     Family History  Problem Relation Age of Onset   Lung cancer Father    Cancer Father    Hypertension Mother    Aneurysm Son     Allergies  Allergen Reactions   Ephedrine  Other (See Comments)    Causing prostate to hurt. Using for sinus bronchodilators Pain in groin, prostate   Nsaids Other (See Comments)    GI upset, history of esophagitis and Mallory-Weis tear   Other Rash, Other (See Comments) and Nausea Only    Paper tape causes rash Plastic tape is okay GI upset, history of esophagitis and Mallory-Weis tear   Vancomycin Itching and Rash    Same as pcn with itching   Amoxicillin Itching and Rash   Hyoscyamine Sulfate Other (See Comments)    Urinary retention Urine retention   Levsin [Hyoscyamine Sulfate] Other (See Comments)    Urinary retention    Naproxen Other (See Comments)    GI upset Also happens with other NSAIDS   Penicillins Itching and Rash    TOLERATED CEFAZOLIN  Has patient had a PCN reaction causing immediate rash, facial/tongue/throat swelling, SOB or lightheadedness with hypotension: yes Has patient had a PCN reaction causing severe rash involving mucus membranes or skin necrosis: no Has patient had a PCN  reaction that required hospitalization: no Has patient had a PCN reaction occurring within the last 10 years: no If all of the above answers are NO, then may proceed with Cephalosporin use.    Shellfish Allergy Itching, Nausea Only and Rash    Ingested shellfish cCuses rash and itching along with stomach sickness. Pt tolerates betadine    Sudafed [Pseudoephedrine Hcl] Other (See Comments)    Constricts prostate flow (from ephedrine  d/t sinus meds)     REVIEW OF SYSTEMS (Negative unless checked)   Constitutional: [] Weight loss  [] Fever  [] Chills Cardiac: [] Chest pain   [] Chest pressure   [] Palpitations   [] Shortness of breath when laying flat   [] Shortness of breath at rest   [] Shortness of breath with exertion. Vascular:  [] Pain in legs with walking   [] Pain in legs at rest   [] Pain in legs when laying flat   [] Claudication   [] Pain in feet when walking  [] Pain in feet at rest  [] Pain in feet when laying flat   [] History of DVT   [] Phlebitis   [] Swelling in legs   [] Varicose veins   [] Non-healing ulcers Pulmonary:   [] Uses home oxygen   [] Productive cough   [] Hemoptysis   [] Wheeze  [] COPD   [] Asthma Neurologic:  [] Dizziness  [] Blackouts   [] Seizures   [] History of stroke   [] History of TIA  [] Aphasia   [] Temporary blindness   [x] Dysphagia   [] Weakness or numbness in arms   [] Weakness or numbness in legs Musculoskeletal:  [x] Arthritis   [] Joint swelling   [] Joint pain   [] Low back pain Hematologic:  [] Easy bruising  [] Easy bleeding   [] Hypercoagulable state   [x] Anemic   Gastrointestinal:  [] Blood in stool    [] Vomiting blood  [x] Gastroesophageal reflux/heartburn   [] Abdominal pain Genitourinary:  [] Chronic kidney disease   [] Difficult urination  [] Frequent urination  [] Burning with urination   [] Hematuria Skin:  [] Rashes   [] Ulcers   [] Wounds Psychological:  [] History of anxiety   []  History of major depression.  Physical Examination  There were no vitals filed for this visit. There is no height or weight on file to calculate BMI. Gen: WD/WN, NAD Head: [place]/AT, No temporalis wasting.  Ear/Nose/Throat: Hearing grossly intact, nares w/o erythema or drainage, oropharynx w/o Erythema/Exudate,  Eyes: Conjunctiva clear, sclera non-icteric Neck: Trachea midline.  No JVD.  Pulmonary:  Good air movement, respirations not labored, no use of accessory muscles.  Cardiac: RRR, normal S1, S2. Vascular:  Vessel Right Left  Radial Palpable Palpable               Musculoskeletal: M/S 5/5 throughout.  Extremities without ischemic changes.  No deformity or atrophy.  Neurologic: Sensation grossly intact in extremities.  Symmetrical.  Speech is fluent. Motor exam as listed above. Psychiatric: Judgment intact, Mood & affect appropriate for pt's clinical situation. Dermatologic: No rashes or ulcers noted.  No cellulitis or open wounds.      CBC Lab Results  Component Value Date   WBC 4.7 10/10/2023   HGB 12.7 (L) 10/10/2023   HCT 37.6 (L) 10/10/2023   MCV 89.9 10/10/2023   PLT 150.0 10/10/2023    BMET    Component Value Date/Time   NA 141 09/12/2023 0954   NA 141 06/07/2016 1456   K 4.1 09/12/2023 0954   CL 103 09/12/2023 0954   CO2 30 09/12/2023 0954   GLUCOSE 142 (H) 09/12/2023 0954   BUN 9 09/12/2023 0954   BUN 12 06/07/2016 1456  CREATININE 0.80 09/12/2023 0954   CALCIUM  9.1 09/12/2023 0954   GFRNONAA >60 02/19/2023 1225   GFRAA >60 09/22/2018 1850   CrCl cannot be calculated (Patient's most recent lab result is older than the maximum 21 days allowed.).  COAG Lab Results   Component Value Date   INR 1.1 06/07/2016   INR 1.15 12/03/2015    Radiology No results found.   Assessment/Plan Carotid artery disease  I have independently reviewed his CT angiogram of the carotid arteries. The official report is of an 80% right ICA stenosis and I would agree that it is at least that bad. The left carotid stenosis is mild and the 30 to 40% range. There is some vertebral artery and intracranial disease that appears mild to moderate in nature. Obviously, the most pressing issue is this high-grade right carotid artery stenosis.  After discussions of options between carotid endarterectomy, TCAR, or carotid stenting, the patient has elected to proceed with carotid stent placement today.  Risks and benefits are discussed and the patient desires to proceed.  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, without long-term current use of insulin  (HCC) blood glucose control important in reducing the progression of atherosclerotic disease. Also, involved in wound healing. On appropriate medications.     Hyperlipidemia lipid control important in reducing the progression of atherosclerotic disease. Continue statin therapy  Selinda Gu, MD  12/04/2023 10:48 AM

## 2023-12-04 NOTE — Op Note (Signed)
 OPERATIVE NOTE DATE: 12/04/2023  PROCEDURE:  Ultrasound guidance for vascular access right femoral artery  Placement of a 9 mm proximal, 7 mm distal, 4 cm long Exact stent with the use of the NAV-6 embolic protection device in the right carotid artery  PRE-OPERATIVE DIAGNOSIS: 1. High grade right carotid artery stenosis. 2. Previous cervical fusion  POST-OPERATIVE DIAGNOSIS:  Same as above  SURGEON: Selinda Gu, MD  ASSISTANT(S):  none  ANESTHESIA: local/MCS  ESTIMATED BLOOD LOSS:  25 cc  CONTRAST: 55 cc  FLUORO TIME: 7.9 minutes  MODERATE CONSCIOUS SEDATION TIME:  Approximately 40 minutes using 2 mg of Versed  and 50 mcg of Fentanyl   FINDING(S): 1.   80% right carotid artery stenosis  SPECIMEN(S):   none  INDICATIONS:   Patient is a 73 y.o. male who presents with high grade right carotid artery stenosis.  The patient has had previous cervical fustion and carotid artery stenting was felt to be preferred to endarterectomy for that reason. I have completed Share Decision Making with Shane Mcknight prior to surgery.  Conversations included: -Discussion of all treatment options including carotid endarterectomy (CEA), CAS (which includes transcarotid artery revascularization (TCAR)), and optimal medical therapy (OMT)). -Explanation of risks and benefits for each option specific to [employer] clinical situation. -Integration of clinical guidelines as it relates to the patient's history and co-morbidities -Discussion and incorporation of Shane Mcknight and their personal preferences and priorities in choosing a treatment plan.  If patient was unable to participate in Shared Decision Making this process was done with the patient   Risks and benefits were discussed and informed consent was obtained.   DESCRIPTION: After obtaining full informed written consent, the patient was brought back to the vascular suite and placed supine upon the table.  The patient received IV antibiotics  prior to induction. Moderate conscious sedation was administered during a face to face encounter with the patient throughout the procedure with my supervision of the RN administering medicines and monitoring the patients vital signs and mental status throughout from the start of the procedure until the patient was taken to the recovery room.  After obtaining adequate anesthesia, the patient was prepped and draped in the standard fashion.   The right femoral artery was visualized with ultrasound and found to be widely patent. It was then accessed under direct ultrasound guidance without difficulty with a Seldinger needle. A permanent image was recorded. A J-wire was placed and we then placed a 6 French sheath. The patient was then heparinized and a total of 10000 units of intravenous heparin  were given and an ACT was checked to confirm successful anticoagulation. A pigtail catheter was then placed into the ascending aorta. This showed a bovine configuration of the aortic arch without proximal stenosis. I then selectively cannulated the innominate artery without difficulty with a headhunter catheter and advanced into the mid right common carotid artery.  Cervical and cerebral carotid angiography was then performed. There were no obvious intracranial filling defects with slight cross filling right to left. The carotid bifurcation demonstrated an approximately 80 to 85% right internal carotid artery stenosis a couple of centimeters above the bifurcation.  I then advanced into the external carotid artery with a Glidewire and the headhunter catheter and then exchanged for the Amplatz Super Stiff wire. Over the Amplatz Super Stiff wire, a 6 French shuttle sheath was placed into the mid common carotid artery. I then used the NAV-6  Embolic protection device and crossed the lesion and  parked this in the distal internal carotid artery at the base of the skull.  I then selected a 9 mm proximal, 7 mm distal, 4 cm long exact  stent. This was deployed across the lesion encompassing it in its entirety. A 4.5 and then a 5 mm diameter by 3 cm length balloon was used to post dilate the stent. Only about a 25% residual stenosis was present after angioplasty. Completion angiogram showed normal intracranial filling without new defects. At this point I elected to terminate the procedure. The sheath was removed and StarClose closure device was deployed in the right femoral artery with excellent hemostatic result. The patient was taken to the recovery room in stable condition having tolerated the procedure well.  COMPLICATIONS: none  CONDITION: stable  Selinda Gu 12/04/2023 1:07 PM   This note was created with Dragon Medical transcription system. Any errors in dictation are purely unintentional.

## 2023-12-05 ENCOUNTER — Encounter (INDEPENDENT_AMBULATORY_CARE_PROVIDER_SITE_OTHER): Payer: Self-pay | Admitting: Vascular Surgery

## 2023-12-05 DIAGNOSIS — Z9889 Other specified postprocedural states: Secondary | ICD-10-CM

## 2023-12-05 DIAGNOSIS — I6521 Occlusion and stenosis of right carotid artery: Principal | ICD-10-CM

## 2023-12-05 DIAGNOSIS — Z95828 Presence of other vascular implants and grafts: Secondary | ICD-10-CM

## 2023-12-05 LAB — CBC
HCT: 30.5 % — ABNORMAL LOW (ref 39.0–52.0)
Hemoglobin: 10.1 g/dL — ABNORMAL LOW (ref 13.0–17.0)
MCH: 30.2 pg (ref 26.0–34.0)
MCHC: 33.1 g/dL (ref 30.0–36.0)
MCV: 91.3 fL (ref 80.0–100.0)
Platelets: 118 K/uL — ABNORMAL LOW (ref 150–400)
RBC: 3.34 MIL/uL — ABNORMAL LOW (ref 4.22–5.81)
RDW: 14.6 % (ref 11.5–15.5)
WBC: 4 K/uL (ref 4.0–10.5)
nRBC: 0 % (ref 0.0–0.2)

## 2023-12-05 LAB — BASIC METABOLIC PANEL WITH GFR
Anion gap: 9 (ref 5–15)
BUN: 17 mg/dL (ref 8–23)
CO2: 23 mmol/L (ref 22–32)
Calcium: 8.3 mg/dL — ABNORMAL LOW (ref 8.9–10.3)
Chloride: 109 mmol/L (ref 98–111)
Creatinine, Ser: 0.84 mg/dL (ref 0.61–1.24)
GFR, Estimated: >60 mL/min (ref >60)
Glucose, Bld: 117 mg/dL — ABNORMAL HIGH (ref 70–99)
Potassium: 4 mmol/L (ref 3.5–5.1)
Sodium: 141 mmol/L (ref 135–145)

## 2023-12-05 LAB — POCT ACTIVATED CLOTTING TIME: Activated Clotting Time: 250 s

## 2023-12-05 NOTE — Progress Notes (Signed)
 Shane Mcknight  A and O x 4. VSS. Pt tolerating diet well. No complaints of pain or nausea. IV removed intact, prescriptions given. Pt voiced understanding of discharge instructions with no further questions. Pt discharged via wheelchair with RN.   [name] [name] [name]

## 2023-12-05 NOTE — Progress Notes (Signed)
 Dr. Marea notified at this time of pt having pauses, with most recent in length of 2.7. CCMD added strip of said pause to the pt's chart. Vitals obtained and BP remains low however consistent with previous readings. Pt is easy to arouse from sleep and is oriented x4. No further orders received at this time.

## 2023-12-05 NOTE — Discharge Summary (Signed)
 [facility]    Discharge Summary    Patient ID:  Shane Mcknight MRN: [id number] DOB/AGE: [birth year] 75 y.o.  Admit date: 12/04/2023 Discharge date: 12/05/2023 Date of Surgery: 12/04/2023 Surgeon: Surgeon(s): Marea Selinda RAMAN, MD  Admission Diagnosis: Carotid stenosis, right [I65.21]  Discharge Diagnoses:  Carotid stenosis, right [I65.21]  Secondary Diagnoses: Past Medical History:  Diagnosis Date   Allergic state    Anemia    Aortic atherosclerosis    Arthritis    spine, hands, shoulder with previous cuff tear   B12 deficiency    CAD (coronary artery disease)    a.) 11/2015 CT: Coronary Ca2+ noted. b.) LHC 06/14/2016: 60% pRCA, 60% RPLB-2, 99% OM1, 50% mLAD, 40% oLM-LM, 30% pLAD, 60% oD1-D1, 60% oLCx, 95% dRCA; PCI placing a 2.5 x 15mm Resolute Onyx DES dRCA. c.) LHC 09/14/2018: 40% oLM-LM, 50% mLAD, 30% pLAD, 60% oD1-D1, 40% oLCx, 100% OM1, 60% p-mRCA, 50% RPAV; dRCA stent widely patent; medical mgmt.   Candidiasis of esophagus (HCC)    Carotid artery stenosis 04/17/2020   a.) Carotid doppler 04/17/2020: 40-59% RICA/LICA stenosis.   Colon polyp    COVID-19    09/2021   DDD (degenerative disc disease), lumbosacral    Diastolic dysfunction 04/06/2020   a.)  TTE 04/06/2020; EF 55-60%; mild LA dilation; no RWMAs; G2DD.   Dysphagia    Effort angina    Elevated PSA    Elevated transaminase level    Esophageal stricture    a.) s/p multiple dilations   Esophagitis, [place] grade D    Fatty liver    Ganglion cyst of finger of right hand    GERD (gastroesophageal reflux disease)    Guaiac positive stools    Hiatal hernia    HNP (herniated nucleus pulposus), lumbar    Hypercholesterolemia    Hypertension    Long term current use of immunosuppressive drug    a.) MTX + secukinumab  for severe psoriatic arthritis   Lumbar radiculitis    Mallory-Weiss tear 11/2015   Migraine aura without headache    OSA (obstructive sleep apnea)    a.) does not  use nocturnal PAP therapy   Psoriasis    Psoriatic arthritis (HCC)    a.) severe; Tx'd with MTX + secukinumab    PVC (premature ventricular contraction)    Schatzki's ring    Type II diabetes mellitus (HCC)     Procedure(s): CAROTID PTA/STENT INTERVENTION PROCEDURE:  Ultrasound guidance for vascular access right femoral artery  Placement of a 9 mm proximal, 7 mm distal, 4 cm long Exact stent with the use of the NAV-6 embolic protection device in the right carotid artery   PRE-OPERATIVE DIAGNOSIS: 1. High grade right carotid artery stenosis. 2. Previous cervical fusion   POST-OPERATIVE DIAGNOSIS:  Same as above   SURGEON: Selinda Marea, MD   ASSISTANT(S):  none   ANESTHESIA: local/MCS   ESTIMATED BLOOD LOSS:  25 cc   CONTRAST: 55 cc   FLUORO TIME: 7.9 minutes   MODERATE CONSCIOUS SEDATION TIME:  Approximately 40 minutes using 2 mg of Versed  and 50 mcg of Fentanyl    FINDING(S): 1.   80% right carotid artery stenosis   Discharged Condition: good  HPI:  Patient presents today for planned carotid intervention.  No focal neurologic symptoms since his last office visit about 2 months ago.  I have independently reviewed his CT angiogram of the carotid arteries.  The official report is of an 80% right ICA stenosis and I would  agree that it is at least that bad.  The left carotid stenosis is mild and the 30 to 40% range.  There is some vertebral artery and intracranial disease that appears mild to moderate in nature.    Hospital Course:  Shane Mcknight is a 73 y.o. male is S/P Right Endovascular Stent Placement  Patient did well overnight.  He was weaned off of dopamine  but this morning his vitals are within stable limits.  He has no focal neurological symptoms or deficits.  Patient is ambulating the halls well, eating well and urinating well.  Patient expected to discharge later today.  Patient is being discharged on 81 mg aspirin  daily, Plavix  75 mg daily and Crestor  20 mg daily.   Patient was instructed not to miss or skip taking any of his medications as it will interfere with the outcome of his procedure.  Patient verbalizes understanding.  I spent greater than 60 minutes in developing, implementing, teaching and discharging this patient today.  Extubated: POD # 0 Physical Exam:  Alert notes x3, no acute distress Face: Symmetrical.  Tongue is midline. Neck: Trachea is midline.  No swelling or bruising. Cardiovascular: Regular rate and rhythm Pulmonary: Clear to auscultation bilaterally Abdomen: Soft, nontender, nondistended Right groin access: Clean dry and intact.  No swelling or drainage noted Left lower extremity: Thigh soft.  Calf soft.  Extremities warm distally toes.  Hard to palpate pedal pulses however the foot is warm is her good capillary refill. Right lower extremity: Thigh soft.  Calf soft.  Extremities warm distally toes.  Hard to palpate pedal pulses however the foot is warm is her good capillary refill. Neurological: No deficits noted   Post-op wounds:  clean, dry, intact or healing well  Pt. Ambulating, voiding and taking PO diet without difficulty. Pt pain controlled with PO pain meds.  Labs:  As below  Complications: none  Consults:    Significant Diagnostic Studies: CBC Lab Results  Component Value Date   WBC 4.0 12/05/2023   HGB 10.1 (L) 12/05/2023   HCT 30.5 (L) 12/05/2023   MCV 91.3 12/05/2023   PLT 118 (L) 12/05/2023    BMET    Component Value Date/Time   NA 141 12/05/2023 0449   NA 141 06/07/2016 1456   K 4.0 12/05/2023 0449   CL 109 12/05/2023 0449   CO2 23 12/05/2023 0449   GLUCOSE 117 (H) 12/05/2023 0449   BUN 17 12/05/2023 0449   BUN 12 06/07/2016 1456   CREATININE 0.84 12/05/2023 0449   CALCIUM  8.3 (L) 12/05/2023 0449   GFRNONAA >60 12/05/2023 0449   GFRAA >60 09/22/2018 1850   COAG Lab Results  Component Value Date   INR 1.1 06/07/2016   INR 1.15 12/03/2015     Disposition:  Discharge to  :Home  Allergies as of 12/05/2023       Reactions   Ephedrine  Other (See Comments)   Causing prostate to hurt. Using for sinus bronchodilators Pain in groin, prostate   Nsaids Other (See Comments)   GI upset, history of esophagitis and Mallory-Weis tear   Other Rash, Other (See Comments), Nausea Only   Paper tape causes rash Plastic tape is okay GI upset, history of esophagitis and Mallory-Weis tear   Vancomycin Itching, Rash   Same as pcn with itching   Amoxicillin Itching, Rash   Hyoscyamine Sulfate Other (See Comments)   Urinary retention Urine retention   Levsin [hyoscyamine Sulfate] Other (See Comments)   Urinary retention   Naproxen  Other (See Comments)   GI upset Also happens with other NSAIDS   Penicillins Itching, Rash   TOLERATED CEFAZOLIN  Has patient had a PCN reaction causing immediate rash, facial/tongue/throat swelling, SOB or lightheadedness with hypotension: yes Has patient had a PCN reaction causing severe rash involving mucus membranes or skin necrosis: no Has patient had a PCN reaction that required hospitalization: no Has patient had a PCN reaction occurring within the last 10 years: no If all of the above answers are NO, then may proceed with Cephalosporin use.   Shellfish Allergy Itching, Nausea Only, Rash   Ingested shellfish cCuses rash and itching along with stomach sickness. Pt tolerates betadine    Sudafed [pseudoephedrine Hcl] Other (See Comments)   Constricts prostate flow (from ephedrine  d/t sinus meds)        Medication List     TAKE these medications    acetaminophen  500 MG tablet Commonly known as: TYLENOL  Take 1,000 mg by mouth daily as needed for moderate pain.   aspirin  EC 81 MG tablet Take 81 mg by mouth every evening.   atenolol  50 MG tablet Commonly known as: TENORMIN  TAKE 1 TABLET BY MOUTH TWICE  DAILY   clopidogrel  75 MG tablet Commonly known as: PLAVIX  Take 1 tablet (75 mg total) by mouth daily.   esomeprazole  40  MG capsule Commonly known as: NEXIUM  Take 40 mg by mouth daily.   famotidine  20 MG tablet Commonly known as: PEPCID  Take 20 mg by mouth at bedtime.   folic acid  1 MG tablet Commonly known as: FOLVITE  Take 1 mg by mouth daily.   gabapentin  300 MG capsule Commonly known as: NEURONTIN  TAKE 1 TO 2 CAPSULES BY  MOUTH AT BEDTIME   glipiZIDE  5 MG 24 hr tablet Commonly known as: GLUCOTROL  XL Take 5 mg by mouth 2 (two) times daily.   isosorbide  mononitrate 30 MG 24 hr tablet Commonly known as: IMDUR  TAKE ONE-HALF TABLET BY MOUTH  DAILY   losartan  25 MG tablet Commonly known as: COZAAR  TAKE ONE-HALF TABLET BY MOUTH  DAILY   methotrexate  2.5 MG tablet Commonly known as: RHEUMATREX Take 2.5 mg by mouth once a week. Pt states he takes 8 (2.5mg ) tablets weekly   nitroGLYCERIN  0.4 MG SL tablet Commonly known as: NITROSTAT  Place 1 tablet (0.4 mg total) under the tongue every 5 (five) minutes as needed. MAXIMUM OF 3 DOSES.   OneTouch Ultra test strip Generic drug: glucose blood Used to check blood glucose one time per day.   rosuvastatin  20 MG tablet Commonly known as: CRESTOR  TAKE 1 TABLET BY MOUTH DAILY   SYRINGE-NEEDLE (DISP) 3 ML 25G X 5/8 3 ML Misc Use as instructed with B12 injection.   vitamin E  180 MG (400 UNITS) capsule Take 400 Units by mouth in the morning.       Verbal and written Discharge instructions given to the patient. Wound care per Discharge AVS  Follow-up Information     Dew, Selinda RAMAN, MD Follow up in 4 week(s).   Specialties: Vascular Surgery, Radiology, Interventional Cardiology Why: Carotid Ultrasounds Contact information: [address] [place] [address] [phone]                 Signed: Gwendlyn JONELLE Shank, NP  12/05/2023, 7:40 AM

## 2023-12-05 NOTE — Progress Notes (Signed)
 [facility]  Daily Progress Note   Subjective  -   Had some bradycardia and soft blood pressure overnight.  Did not tolerate low-dose dopamine .  Heart rate now around 50 with blood pressure of about 100/70.  Feeling well.  Objective Vitals:   12/05/23 0300 12/05/23 0426 12/05/23 0637 12/05/23 0705  BP: (!) 97/57 (!) 93/59 (!) 113/54 97/67  Pulse: (!) 37 (!) 33 (!) 46 (!) 53  Resp: (!) 7 15 (!) 22 (!) 32  Temp:  97.7 F (36.5 C)    TempSrc:  Oral    SpO2: 100% 99% 100% 100%  Weight:      Height:        Intake/Output Summary (Last 24 hours) at 12/05/2023 0803 Last data filed at 12/05/2023 0759 Gross per 24 hour  Intake 1174.37 ml  Output 400 ml  Net 774.37 ml    PULM  CTAB CV  somewhat bradycardic VASC  access site is without hematoma, feet are warm  Laboratory CBC    Component Value Date/Time   WBC 4.0 12/05/2023 0449   HGB 10.1 (L) 12/05/2023 0449   HGB 12.7 (L) 06/07/2016 1456   HCT 30.5 (L) 12/05/2023 0449   HCT 38.5 06/07/2016 1456   PLT 118 (L) 12/05/2023 0449   PLT 242 06/07/2016 1456    BMET    Component Value Date/Time   NA 141 12/05/2023 0449   NA 141 06/07/2016 1456   K 4.0 12/05/2023 0449   CL 109 12/05/2023 0449   CO2 23 12/05/2023 0449   GLUCOSE 117 (H) 12/05/2023 0449   BUN 17 12/05/2023 0449   BUN 12 06/07/2016 1456   CREATININE 0.84 12/05/2023 0449   CALCIUM  8.3 (L) 12/05/2023 0449   GFRNONAA >60 12/05/2023 0449   GFRAA >60 09/22/2018 1850    Assessment/Planning: POD # 1 s/p right carotid stent  Some bradycardia and low blood pressure overnight.  A little better this morning. Increasing his activity should help both facets.  We can hold his antihypertensives for now. He has been struggling with worsening hypertension at home, but this may improve his blood pressure long-term Likely discharge home later today as long as blood pressure and heart rate are stable   Selinda Gu  12/05/2023, 8:03 AM

## 2023-12-05 NOTE — Discharge Instructions (Addendum)
 Vascular surgery discharge instructions:  Do not lift anything heavy for the next 3 weeks.  Do not lift anything more than a gallon of milk.  Do not drive for the next 2 weeks.  Do not drive if you are taking any narcotic medication.  You may shower tomorrow on 12/06/2023.  Shower with the old dressing to your groin in place.  Remove immediately after showering and pat the area completely dry.  Cover the puncture site with a Band-Aid for the next 3 days.  You are being discharged on aspirin  81 mg daily, Plavix  75 mg daily and Crestor  20 mg daily.  Do not miss or skip taking any of these medications as it will affect the outcome of your procedure.  Follow-up with vein and vascular surgery as scheduled.

## 2023-12-08 ENCOUNTER — Telehealth: Payer: Self-pay | Admitting: *Deleted

## 2023-12-08 NOTE — Transitions of Care (Post Inpatient/ED Visit) (Signed)
 12/08/2023  Name: Shane Mcknight MRN: [id number] DOB: 24-Mar-1950  Today's TOC FU Call Status: Today's TOC FU Call Status:: Successful TOC FU Call Completed TOC FU Call Complete Date: 12/08/23  Patient's Name and Date of Birth confirmed. Name, DOB  Transition Care Management Follow-up Telephone Call Date of Discharge: 12/05/23 Discharge Facility: [facility]) Type of Discharge: Inpatient Admission Primary Inpatient Discharge Diagnosis:: Carotid stenosis, right How have you been since you were released from the hospital?: Better Any questions or concerns?: No  Items Reviewed: Did you receive and understand the discharge instructions provided?: Yes Medications obtained,verified, and reconciled?: Yes (Medications Reviewed) Any new allergies since your discharge?: No Dietary orders reviewed?: No Do you have support at home?: Yes People in Home [RPT]: spouse Name of Support/Comfort Primary Source: [place]  Medications Reviewed Today: Medications Reviewed Today     Reviewed by Kennieth Cathlean DEL, RN (Case Manager) on 12/08/23 at [phone]  Med List Status: <None>   Medication Order Taking? Sig Documenting Provider Last Dose Status Informant  acetaminophen  (TYLENOL ) 500 MG tablet [id number] Yes Take 1,000 mg by mouth daily as needed for moderate pain. [provider]  Active Self  aspirin  EC 81 MG tablet [id number] Yes Take 81 mg by mouth every evening.  [provider]  Active Self  atenolol  (TENORMIN ) 50 MG tablet [id number] Yes TAKE 1 TABLET BY MOUTH TWICE  DAILY Glendia Shad, MD  Active Self  clopidogrel  (PLAVIX ) 75 MG tablet [id number] Yes Take 1 tablet (75 mg total) by mouth daily. Marea Selinda RAMAN, MD  Active Self  esomeprazole  (NEXIUM ) 40 MG capsule [id number] Yes Take 40 mg by mouth daily. [provider]  Active Self  famotidine  (PEPCID ) 20 MG tablet [id number] Yes Take 20 mg by mouth at bedtime. [provider]  Active Self   folic acid  (FOLVITE ) 1 MG tablet [id number] Yes Take 1 mg by mouth daily. [provider]  Active Self  gabapentin  (NEURONTIN ) 300 MG capsule [id number]  TAKE 1 TO 2 CAPSULES BY  MOUTH AT BEDTIME  Patient not taking: Reported on 12/08/2023   Glendia Shad, MD  Active Self           Med Note SOILA LYLE BROCKS   Wed Apr 04, 2021 11:46 AM)    glipiZIDE  (GLUCOTROL  XL) 5 MG 24 hr tablet [id number] Yes Take 5 mg by mouth 2 (two) times daily. [provider]  Active Self  glucose blood (ONETOUCH ULTRA) test strip [id number] Yes Used to check blood glucose one time per day. Glendia Shad, MD  Active Self  isosorbide  mononitrate (IMDUR ) 30 MG 24 hr tablet [id number] Yes TAKE ONE-HALF TABLET BY MOUTH  DAILY Arida, Muhammad A, MD  Active Self  losartan  (COZAAR ) 25 MG tablet [id number] Yes TAKE ONE-HALF TABLET BY MOUTH  DAILY Glendia Shad, MD  Active Self  methotrexate  (RHEUMATREX) 2.5 MG tablet [id number] Yes Take 2.5 mg by mouth once a week. Pt states he takes 8 (2.5mg ) tablets weekly [provider]  Active Self  nitroGLYCERIN  (NITROSTAT ) 0.4 MG SL tablet [id number] Yes Place 1 tablet (0.4 mg total) under the tongue every 5 (five) minutes as needed. MAXIMUM OF 3 DOSES. Vivienne Lonni Ingle, NP  Active Self  rosuvastatin  (CRESTOR ) 20 MG tablet [id number] Yes TAKE 1 TABLET BY MOUTH DAILY Glendia Shad, MD  Active Self  SYRINGE-NEEDLE, DISP, 3 ML 25G X 5/8 3 ML MISC [id number] Yes Use as instructed with B12 injection. Glendia Shad, MD  Active Self  vitamin E  400 UNIT capsule [id number] Yes Take 400 Units by mouth in the morning. [provider]  Active Self            Home Care and Equipment/Supplies: Were Home Health Services Ordered?: NA Any new equipment or medical supplies ordered?: NA  Functional Questionnaire: Do you need assistance with bathing/showering or dressing?: No Do you need assistance with meal preparation?: No Do you need assistance with  eating?: No Do you have difficulty maintaining continence: No Do you need assistance with getting out of bed/getting out of a chair/moving?: No Do you have difficulty managing or taking your medications?: No  Follow up appointments reviewed: PCP Follow-up appointment confirmed?: Yes Date of PCP follow-up appointment?: 12/26/23 Follow-up Provider: [facility] Follow-up appointment confirmed?: No Reason Specialist Follow-Up Not Confirmed: Patient has Specialist Provider Number and will Call for Appointment Do you need transportation to your follow-up appointment?: No Do you understand care options if your condition(s) worsen?: Yes-patient verbalized understanding  SDOH Interventions Today    Flowsheet Row Most Recent Value  SDOH Interventions   Food Insecurity Interventions Intervention Not Indicated  Housing Interventions Intervention Not Indicated  Transportation Interventions Intervention Not Indicated  Utilities Interventions Intervention Not Indicated   Discussed and offered 30 day TOC program.  Patient   declined.  The patient has been provided with contact information for the care management team and has been advised to call with any health -related questions or concerns.  The patient verbalized understanding with current plan of care.  The patient is directed to their insurance card regarding availability of benefits coverage  Cathlean Headland BSN RN [facility] Health Care Management Coordinator [email or web address] Direct Dial: [phone]  Fax: [phone] Website: [email or web address]

## 2023-12-20 ENCOUNTER — Other Ambulatory Visit: Payer: Self-pay | Admitting: Internal Medicine

## 2023-12-23 NOTE — Telephone Encounter (Signed)
 Per recent hospitalization, on glipizide  5mg .

## 2023-12-26 ENCOUNTER — Encounter: Payer: Self-pay | Admitting: Internal Medicine

## 2023-12-26 ENCOUNTER — Ambulatory Visit: Admitting: Internal Medicine

## 2023-12-26 VITALS — BP 148/84 | HR 79 | Temp 98.7°F | Ht 68.5 in | Wt 172.0 lb

## 2023-12-26 DIAGNOSIS — D649 Anemia, unspecified: Secondary | ICD-10-CM

## 2023-12-26 DIAGNOSIS — E785 Hyperlipidemia, unspecified: Secondary | ICD-10-CM

## 2023-12-26 DIAGNOSIS — I5022 Chronic systolic (congestive) heart failure: Secondary | ICD-10-CM

## 2023-12-26 DIAGNOSIS — E0822 Diabetes mellitus due to underlying condition with diabetic chronic kidney disease: Secondary | ICD-10-CM

## 2023-12-26 DIAGNOSIS — I10 Essential (primary) hypertension: Secondary | ICD-10-CM

## 2023-12-26 DIAGNOSIS — Z860100 Personal history of colon polyps, unspecified: Secondary | ICD-10-CM

## 2023-12-26 DIAGNOSIS — I779 Disorder of arteries and arterioles, unspecified: Secondary | ICD-10-CM

## 2023-12-26 DIAGNOSIS — D508 Other iron deficiency anemias: Secondary | ICD-10-CM

## 2023-12-26 DIAGNOSIS — Z0000 Encounter for general adult medical examination without abnormal findings: Secondary | ICD-10-CM | POA: Diagnosis not present

## 2023-12-26 DIAGNOSIS — D696 Thrombocytopenia, unspecified: Secondary | ICD-10-CM | POA: Diagnosis not present

## 2023-12-26 DIAGNOSIS — R7989 Other specified abnormal findings of blood chemistry: Secondary | ICD-10-CM

## 2023-12-26 DIAGNOSIS — I25119 Atherosclerotic heart disease of native coronary artery with unspecified angina pectoris: Secondary | ICD-10-CM | POA: Diagnosis not present

## 2023-12-26 DIAGNOSIS — K219 Gastro-esophageal reflux disease without esophagitis: Secondary | ICD-10-CM | POA: Diagnosis not present

## 2023-12-26 DIAGNOSIS — E538 Deficiency of other specified B group vitamins: Secondary | ICD-10-CM | POA: Diagnosis not present

## 2023-12-26 DIAGNOSIS — K222 Esophageal obstruction: Secondary | ICD-10-CM

## 2023-12-26 DIAGNOSIS — E1122 Type 2 diabetes mellitus with diabetic chronic kidney disease: Secondary | ICD-10-CM | POA: Diagnosis not present

## 2023-12-26 DIAGNOSIS — L4050 Arthropathic psoriasis, unspecified: Secondary | ICD-10-CM

## 2023-12-26 DIAGNOSIS — R1310 Dysphagia, unspecified: Secondary | ICD-10-CM

## 2023-12-26 DIAGNOSIS — N183 Chronic kidney disease, stage 3 unspecified: Secondary | ICD-10-CM | POA: Diagnosis not present

## 2023-12-26 LAB — CBC WITH DIFFERENTIAL/PLATELET
Basophils Absolute: 0 K/uL (ref 0.0–0.1)
Basophils Relative: 0.4 % (ref 0.0–3.0)
Eosinophils Absolute: 0.3 K/uL (ref 0.0–0.7)
Eosinophils Relative: 4.9 % (ref 0.0–5.0)
HCT: 36.3 % — ABNORMAL LOW (ref 39.0–52.0)
Hemoglobin: 12.9 g/dL — ABNORMAL LOW (ref 13.0–17.0)
Lymphocytes Relative: 15.2 % (ref 12.0–46.0)
Lymphs Abs: 0.9 K/uL (ref 0.7–4.0)
MCHC: 35.5 g/dL (ref 30.0–36.0)
MCV: 87.4 fl (ref 78.0–100.0)
Monocytes Absolute: 0.7 K/uL (ref 0.1–1.0)
Monocytes Relative: 12.5 % — ABNORMAL HIGH (ref 3.0–12.0)
Neutro Abs: 3.9 K/uL (ref 1.4–7.7)
Neutrophils Relative %: 67 % (ref 43.0–77.0)
Platelets: 179 K/uL (ref 150.0–400.0)
RBC: 4.15 Mil/uL — ABNORMAL LOW (ref 4.22–5.81)
RDW: 16.3 % — ABNORMAL HIGH (ref 11.5–15.5)
WBC: 5.8 K/uL (ref 4.0–10.5)

## 2023-12-26 LAB — LIPID PANEL
Cholesterol: 98 mg/dL (ref 0–200)
HDL: 39.6 mg/dL (ref >39.00)
LDL Cholesterol: 20 mg/dL (ref 0–99)
NonHDL: 58.12
Total CHOL/HDL Ratio: 2
Triglycerides: 190 mg/dL — ABNORMAL HIGH (ref 0.0–149.0)
VLDL: 38 mg/dL (ref 0.0–40.0)

## 2023-12-26 LAB — BASIC METABOLIC PANEL WITH GFR
BUN: 9 mg/dL (ref 6–23)
CO2: 27 meq/L (ref 19–32)
Calcium: 9 mg/dL (ref 8.4–10.5)
Chloride: 107 meq/L (ref 96–112)
Creatinine, Ser: 0.9 mg/dL (ref 0.40–1.50)
GFR: 83.71 mL/min (ref >60.00)
Glucose, Bld: 156 mg/dL — ABNORMAL HIGH (ref 70–99)
Potassium: 3.5 meq/L (ref 3.5–5.1)
Sodium: 143 meq/L (ref 135–145)

## 2023-12-26 LAB — IBC + FERRITIN
Ferritin: 14.6 ng/mL — ABNORMAL LOW (ref 22.0–322.0)
Iron: 54 ug/dL (ref 42–165)
Saturation Ratios: 14.1 % — ABNORMAL LOW (ref 20.0–50.0)
TIBC: 382.2 ug/dL (ref 250.0–450.0)
Transferrin: 273 mg/dL (ref 212.0–360.0)

## 2023-12-26 LAB — HEPATIC FUNCTION PANEL
ALT: 12 U/L (ref 0–53)
AST: 15 U/L (ref 0–37)
Albumin: 4.4 g/dL (ref 3.5–5.2)
Alkaline Phosphatase: 60 U/L (ref 39–117)
Bilirubin, Direct: 0.1 mg/dL (ref 0.0–0.3)
Total Bilirubin: 0.5 mg/dL (ref 0.2–1.2)
Total Protein: 7.2 g/dL (ref 6.0–8.3)

## 2023-12-26 LAB — HM DIABETES FOOT EXAM

## 2023-12-26 LAB — HEMOGLOBIN A1C: Hgb A1c MFr Bld: 6.3 % (ref 4.6–6.5)

## 2023-12-26 LAB — VITAMIN B12: Vitamin B-12: 1353 pg/mL — ABNORMAL HIGH (ref 211–911)

## 2023-12-26 MED ORDER — LOSARTAN POTASSIUM 50 MG PO TABS: 50.0000 mg | ORAL_TABLET | Freq: Every day | ORAL | 1 refills | Status: DC

## 2023-12-26 MED ORDER — CYANOCOBALAMIN 1000 MCG/ML IJ SOLN
1000.0000 ug | Freq: Once | INTRAMUSCULAR | Status: AC
  Administered 2023-12-26: 1000 ug via INTRAMUSCULAR

## 2023-12-26 MED ORDER — ROSUVASTATIN CALCIUM 20 MG PO TABS: 20.0000 mg | ORAL_TABLET | Freq: Every day | ORAL | 1 refills | Status: DC

## 2023-12-26 MED ORDER — ISOSORBIDE MONONITRATE ER 30 MG PO TB24: 30.0000 mg | ORAL_TABLET | Freq: Every day | ORAL | 0 refills | Status: DC

## 2023-12-26 MED ORDER — ATENOLOL 50 MG PO TABS: 50.0000 mg | ORAL_TABLET | Freq: Two times a day (BID) | ORAL | 1 refills | Status: DC

## 2023-12-26 MED ORDER — SYRINGE 2-3 ML 3 ML MISC: 5 refills | Status: DC

## 2023-12-26 NOTE — Progress Notes (Unsigned)
 Subjective:    Patient ID: Shane Mcknight, male    DOB: 03/14/1950, 73 y.o.   MRN: [id number]  Patient here for  Chief Complaint  Patient presents with   Annual Exam   Hypertension    HPI Here for a physical exam. Admitted 12/04/23 - 12/05/23 - right carotid stenosis - s/p endovascular stent placement. Discharged to continue aspirin , plavix  and crestor . Had f/u with GI 10/10/23 - f/u GERD - better after antireflux surgery. Doing well with his swallowing. Continue nexium . Had f/u with rheumatology 09/02/23 - f/u psoriatic arthritis. Continue methotrexate . Reapplying for cosentyx  - pt asistance 2026. Tries to stay active. No chest pain or sob reported. No abdominal pain or bowel change reported. Left foot pain. Discussed continuing support shoes. Discussed low B12. Agreeable for injections. Planning to give at home.    Past Medical History:  Diagnosis Date   Allergic state    Anemia    Aortic atherosclerosis    Arthritis    spine, hands, shoulder with previous cuff tear   B12 deficiency    CAD (coronary artery disease)    a.) 11/2015 CT: Coronary Ca2+ noted. b.) LHC 06/14/2016: 60% pRCA, 60% RPLB-2, 99% OM1, 50% mLAD, 40% oLM-LM, 30% pLAD, 60% oD1-D1, 60% oLCx, 95% dRCA; PCI placing a 2.5 x 15mm Resolute Onyx DES dRCA. c.) LHC 09/14/2018: 40% oLM-LM, 50% mLAD, 30% pLAD, 60% oD1-D1, 40% oLCx, 100% OM1, 60% p-mRCA, 50% RPAV; dRCA stent widely patent; medical mgmt.   Candidiasis of esophagus (HCC)    Carotid artery stenosis 04/17/2020   a.) Carotid doppler 04/17/2020: 40-59% RICA/LICA stenosis.   Colon polyp    COVID-19    09/2021   DDD (degenerative disc disease), lumbosacral    Diastolic dysfunction 04/06/2020   a.)  TTE 04/06/2020; EF 55-60%; mild LA dilation; no RWMAs; G2DD.   Dysphagia    Effort angina    Elevated PSA    Elevated transaminase level    Esophageal stricture    a.) s/p multiple dilations   Esophagitis, [place] grade D    Fatty liver    Ganglion cyst of finger  of right hand    GERD (gastroesophageal reflux disease)    Guaiac positive stools    Hiatal hernia    HNP (herniated nucleus pulposus), lumbar    Hypercholesterolemia    Hypertension    Long term current use of immunosuppressive drug    a.) MTX + secukinumab  for severe psoriatic arthritis   Lumbar radiculitis    Mallory-Weiss tear 11/2015   Migraine aura without headache    OSA (obstructive sleep apnea)    a.) does not use nocturnal PAP therapy   Psoriasis    Psoriatic arthritis (HCC)    a.) severe; Tx'd with MTX + secukinumab    PVC (premature ventricular contraction)    Schatzki's ring    Type II diabetes mellitus (HCC)    Past Surgical History:  Procedure Laterality Date   ANTERIOR CERVICAL DECOMP/DISCECTOMY FUSION N/A 04/13/2021   Procedure: C3-5 ANTERIOR CERVICAL DISCECTOMY AND FUSION (GLOBUS ALLOGRAFT);  Surgeon: Clois Fret, MD;  Location: [facility] ORS;  Service: Neurosurgery;  Laterality: N/A;   ARTHROPLASTY     right thumb; left thumb with 2 screws and plastic joint   BALLOON DILATION N/A 05/29/2015   Procedure: BALLOON DILATION;  Surgeon: Gladis RAYMOND Mariner, MD;  Location: [facility] ENDOSCOPY;  Service: Endoscopy;  Laterality: N/A;   BALLOON DILATION N/A 02/14/2017   Procedure: BALLOON DILATION;  Surgeon: Mariner Gladis RAYMOND, MD;  Location: [facility] ENDOSCOPY;  Service: Endoscopy;  Laterality: N/A;   BILATERAL CARPAL TUNNEL RELEASE Bilateral 07/04/2017   Procedure: BILATERAL CARPAL TUNNEL RELEASE;  Surgeon: Maryl Barters, MD;  Location: [facility] ORS;  Service: Orthopedics;  Laterality: Bilateral;   BIOPSY  08/05/2022   Procedure: BIOPSY;  Surgeon: Maryruth Ole DASEN, MD;  Location: [facility] ENDOSCOPY;  Service: Endoscopy;;   BLEPHAROPLASTY     BUNIONECTOMY Left 12/2020   CARDIAC CATHETERIZATION     1 stent    CAROTID PTA/STENT INTERVENTION Right 12/04/2023   Procedure: CAROTID PTA/STENT INTERVENTION;  Surgeon: Marea Selinda RAMAN, MD;  Location: [facility] INVASIVE CV LAB;  Service:  Cardiovascular;  Laterality: Right;   CARPAL TUNNEL RELEASE  2005   right   CHOLECYSTECTOMY  2008   COLONOSCOPY     COLONOSCOPY WITH PROPOFOL  N/A 02/13/2018   Procedure: COLONOSCOPY WITH PROPOFOL ;  Surgeon: Gaylyn Gladis PENNER, MD;  Location: [facility] ENDOSCOPY;  Service: Endoscopy;  Laterality: N/A;   COLONOSCOPY WITH PROPOFOL  N/A 08/05/2022   Procedure: COLONOSCOPY WITH PROPOFOL ;  Surgeon: Maryruth Ole DASEN, MD;  Location: [facility] ENDOSCOPY;  Service: Endoscopy;  Laterality: N/A;   CORONARY STENT INTERVENTION N/A 06/14/2016   Procedure: Coronary Stent Intervention;  Surgeon: Darron Deatrice LABOR, MD;  Location: [facility] INVASIVE CV LAB;  Service: Cardiovascular;  Laterality: N/A;   CYST EXCISION  04/14/2015   tendon sheath cyst excision; right ring finger cyst removed and trigger finger realease   ESOPHAGOGASTRODUODENOSCOPY     ESOPHAGOGASTRODUODENOSCOPY (EGD) WITH PROPOFOL  N/A 05/29/2015   Procedure: ESOPHAGOGASTRODUODENOSCOPY (EGD) WITH PROPOFOL ;  Surgeon: Gladis PENNER Gaylyn, MD;  Location: [facility] ENDOSCOPY;  Service: Endoscopy;  Laterality: N/A;   ESOPHAGOGASTRODUODENOSCOPY (EGD) WITH PROPOFOL  N/A 07/04/2015   Procedure: ESOPHAGOGASTRODUODENOSCOPY (EGD) WITH PROPOFOL ;  Surgeon: Gladis PENNER Gaylyn, MD;  Location: [facility] ENDOSCOPY;  Service: Endoscopy;  Laterality: N/A;   ESOPHAGOGASTRODUODENOSCOPY (EGD) WITH PROPOFOL  N/A 01/04/2016   Procedure: ESOPHAGOGASTRODUODENOSCOPY (EGD) WITH PROPOFOL ;  Surgeon: Gladis PENNER Gaylyn, MD;  Location: [facility] ENDOSCOPY;  Service: Endoscopy;  Laterality: N/A;   ESOPHAGOGASTRODUODENOSCOPY (EGD) WITH PROPOFOL  N/A 03/22/2016   Procedure: ESOPHAGOGASTRODUODENOSCOPY (EGD) WITH PROPOFOL ;  Surgeon: Gladis PENNER Gaylyn, MD;  Location: [facility] ENDOSCOPY;  Service: Endoscopy;  Laterality: N/A;   ESOPHAGOGASTRODUODENOSCOPY (EGD) WITH PROPOFOL  N/A 02/14/2017   Procedure: ESOPHAGOGASTRODUODENOSCOPY (EGD) WITH PROPOFOL ;  Surgeon: Gaylyn Gladis PENNER, MD;  Location: [facility] ENDOSCOPY;  Service:  Endoscopy;  Laterality: N/A;   ESOPHAGOGASTRODUODENOSCOPY (EGD) WITH PROPOFOL  N/A 04/11/2017   Procedure: ESOPHAGOGASTRODUODENOSCOPY (EGD) WITH PROPOFOL ;  Surgeon: Gaylyn Gladis PENNER, MD;  Location: [facility] ENDOSCOPY;  Service: Endoscopy;  Laterality: N/A;   ESOPHAGOGASTRODUODENOSCOPY (EGD) WITH PROPOFOL  N/A 07/07/2017   Procedure: ESOPHAGOGASTRODUODENOSCOPY (EGD) WITH PROPOFOL ;  Surgeon: Jinny Carmine, MD;  Location: [facility] ENDOSCOPY;  Service: Endoscopy;  Laterality: N/A;   ESOPHAGOGASTRODUODENOSCOPY (EGD) WITH PROPOFOL  N/A 02/13/2018   Procedure: ESOPHAGOGASTRODUODENOSCOPY (EGD) WITH PROPOFOL ;  Surgeon: Gaylyn Gladis PENNER, MD;  Location: [facility] ENDOSCOPY;  Service: Endoscopy;  Laterality: N/A;   ESOPHAGOGASTRODUODENOSCOPY (EGD) WITH PROPOFOL  N/A 08/05/2022   Procedure: ESOPHAGOGASTRODUODENOSCOPY (EGD) WITH PROPOFOL ;  Surgeon: Maryruth Ole DASEN, MD;  Location: [facility] ENDOSCOPY;  Service: Endoscopy;  Laterality: N/A;   esophagus repaiir   02/17/2023   Emergency surgery at [facility]   FOREIGN BODY REMOVAL N/A 12/03/2015   Procedure: FOREIGN BODY REMOVAL;  Surgeon: Jerrell Sol, MD;  Location: [facility] ENDOSCOPY;  Service: Endoscopy;  Laterality: N/A;   HERNIA REPAIR Right 1970   inguinal   HIATAL HERNIA REPAIR  07/2023   [facility]   INGUINAL HERNIA REPAIR Left 01/01/2018   Procedure: OPEN HERNIA  REPAIR INGUINAL ADULT;  Surgeon: Desiderio Schanz, MD;  Location: [facility] ORS;  Service: General;  Laterality: Left;   LEFT HEART CATH Left 06/14/2016   Procedure: Left Heart Cath;  Surgeon: Darron Deatrice LABOR, MD;  Location: [facility] INVASIVE CV LAB;  Service: Cardiovascular;  Laterality: Left;   LEFT HEART CATH AND CORONARY ANGIOGRAPHY Left 09/14/2018   Procedure: LEFT HEART CATH AND CORONARY ANGIOGRAPHY;  Surgeon: Darron Deatrice LABOR, MD;  Location: [facility] INVASIVE CV LAB;  Service: Cardiovascular;  Laterality: Left;   LUMBAR DISC SURGERY  2004   injections; herniated disc; percutaneous discectomy   NASAL SINUS SURGERY  2008    POLYPECTOMY  08/05/2022   Procedure: POLYPECTOMY;  Surgeon: Maryruth Ole DASEN, MD;  Location: [facility] ENDOSCOPY;  Service: Endoscopy;;   ROTATOR CUFF REPAIR Right    TONSILLECTOMY     Family History  Problem Relation Age of Onset   Lung cancer Father    Cancer Father    Hypertension Mother    Aneurysm Son    Social History   Socioeconomic History   Marital status: Married    Spouse name: Heron   Number of children: Not on file   Years of education: Not on file   Highest education level: Bachelor's degree (e.g., BA, AB, BS)  Occupational History    Comment: not heavy work  Tobacco Use   Smoking status: Former    Current packs/day: 0.00    Average packs/day: 0.3 packs/day for 7.0 years (1.8 ttl pk-yrs)    Types: Cigarettes    Start date: 07/06/1975    Quit date: 07/06/1982    Years since quitting: 41.5    Passive exposure: Past   Smokeless tobacco: Never  Vaping Use   Vaping status: Never Used  Substance and Sexual Activity   Alcohol use: Not Currently   Drug use: No   Sexual activity: Not on file  Other Topics Concern   Not on file  Social History Narrative   Lives locally with wife.  Active but does not routinely exercise.   Social Drivers of Health   Tobacco Use: Medium Risk (12/27/2023)   Patient History    Smoking Tobacco Use: Former    Smokeless Tobacco Use: Never    Passive Exposure: Past  Physicist, Medical Strain: Low Risk (11/12/2023)   Overall Financial Resource Strain (CARDIA)    Difficulty of Paying Living Expenses: Not hard at all  Food Insecurity: No Food Insecurity (12/08/2023)   Epic    Worried About Programme Researcher, Broadcasting/film/video in the Last Year: Never true    Ran Out of Food in the Last Year: Never true  Transportation Needs: No Transportation Needs (12/08/2023)   Epic    Lack of Transportation (Medical): No    Lack of Transportation (Non-Medical): No  Physical Activity: Insufficiently Active (11/12/2023)   Exercise Vital Sign    Days of Exercise  per Week: 4 days    Minutes of Exercise per Session: 20 min  Stress: No Stress Concern Present (11/12/2023)   [employer] of Occupational Health - Occupational Stress Questionnaire    Feeling of Stress: Not at all  Social Connections: Socially Integrated (12/04/2023)   Social Connection and Isolation Panel    Frequency of Communication with Friends and Family: More than three times a week    Frequency of Social Gatherings with Friends and Family: Three times a week    Attends Religious Services: More than 4 times per year    Active Member of Clubs or Organizations:  Yes    Attends Club or Organization Meetings: 1 to 4 times per year    Marital Status: Married  Depression (PHQ2-9): Low Risk (12/26/2023)   Depression (PHQ2-9)    PHQ-2 Score: 0  Alcohol Screen: Low Risk (11/12/2023)   Alcohol Screen    Last Alcohol Screening Score (AUDIT): 0  Housing: Low Risk (12/08/2023)   Epic    Unable to Pay for Housing in the Last Year: No    Number of Times Moved in the Last Year: 0    Homeless in the Last Year: No  Utilities: Not At Risk (12/08/2023)   Epic    Threatened with loss of utilities: No  Health Literacy: Adequate Health Literacy (11/12/2023)   B1300 Health Literacy    Frequency of need for help with medical instructions: Never     Review of Systems  Constitutional:  Negative for appetite change and unexpected weight change.  HENT:  Negative for congestion, sinus pressure and sore throat.   Eyes:  Negative for pain and visual disturbance.  Respiratory:  Negative for cough, chest tightness and shortness of breath.   Cardiovascular:  Negative for chest pain, palpitations and leg swelling.  Gastrointestinal:  Negative for abdominal pain, diarrhea, nausea and vomiting.  Genitourinary:  Negative for difficulty urinating and dysuria.  Musculoskeletal:  Negative for myalgias.       Foot pain as outlined.   Skin:  Negative for color change and rash.  Neurological:  Negative for  dizziness and headaches.  Hematological:  Negative for adenopathy. Does not bruise/bleed easily.  Psychiatric/Behavioral:  Negative for agitation and dysphoric mood.        Objective:     BP (!) 148/84   Pulse 79   Temp 98.7 F (37.1 C)   Ht 5' 8.5 (1.74 m)   Wt 172 lb (78 kg)   SpO2 96%   BMI 25.77 kg/m  Wt Readings from Last 3 Encounters:  12/26/23 172 lb (78 kg)  12/04/23 170 lb 13.7 oz (77.5 kg)  11/12/23 160 lb (72.6 kg)    Physical Exam Vitals reviewed.  Constitutional:      General: He is not in acute distress.    Appearance: Normal appearance. He is well-developed.  HENT:     Head: Normocephalic and atraumatic.     Right Ear: External ear normal.     Left Ear: External ear normal.     Mouth/Throat:     Pharynx: No oropharyngeal exudate or posterior oropharyngeal erythema.  Eyes:     General: No scleral icterus.       Right eye: No discharge.        Left eye: No discharge.     Conjunctiva/sclera: Conjunctivae normal.  Neck:     Thyroid : No thyromegaly.  Cardiovascular:     Rate and Rhythm: Normal rate and regular rhythm.  Pulmonary:     Effort: Pulmonary effort is normal. No respiratory distress.     Breath sounds: Normal breath sounds. No wheezing.  Abdominal:     General: Bowel sounds are normal.     Palpations: Abdomen is soft.     Tenderness: There is no abdominal tenderness.  Musculoskeletal:        General: No swelling or tenderness.     Cervical back: Neck supple. No tenderness.  Lymphadenopathy:     Cervical: No cervical adenopathy.  Skin:    Findings: No erythema or rash.  Neurological:     Mental Status: He is alert and oriented  to person, place, and time.  Psychiatric:        Mood and Affect: Mood normal.        Behavior: Behavior normal.         Outpatient Encounter Medications as of 12/26/2023  Medication Sig   acetaminophen  (TYLENOL ) 500 MG tablet Take 1,000 mg by mouth daily as needed for moderate pain.   aspirin  EC 81 MG  tablet Take 81 mg by mouth every evening.    clopidogrel  (PLAVIX ) 75 MG tablet Take 1 tablet (75 mg total) by mouth daily.   esomeprazole  (NEXIUM ) 40 MG capsule Take 40 mg by mouth daily.   famotidine  (PEPCID ) 20 MG tablet Take 20 mg by mouth at bedtime.   folic acid  (FOLVITE ) 1 MG tablet Take 1 mg by mouth daily.   gabapentin  (NEURONTIN ) 300 MG capsule TAKE 1 TO 2 CAPSULES BY  MOUTH AT BEDTIME   glipiZIDE  (GLUCOTROL  XL) 5 MG 24 hr tablet Take 5 mg by mouth 2 (two) times daily.   glucose blood (ONETOUCH ULTRA) test strip Used to check blood glucose one time per day.   losartan  (COZAAR ) 50 MG tablet Take 1 tablet (50 mg total) by mouth daily.   methotrexate  (RHEUMATREX) 2.5 MG tablet Take 2.5 mg by mouth once a week. Pt states he takes 8 (2.5mg ) tablets weekly   NEEDLE, DISP, 25 G 25G X 1 MISC USE AS DIRECTED TO GIVE B12 ONCE A WEEK FOR 3 WEEKS AND THEN MONTHLY   nitroGLYCERIN  (NITROSTAT ) 0.4 MG SL tablet Place 1 tablet (0.4 mg total) under the tongue every 5 (five) minutes as needed. MAXIMUM OF 3 DOSES.   Syringe, Disposable, (2-3CC SYRINGE) 3 ML MISC USE AS DIRECTED   SYRINGE-NEEDLE, DISP, 3 ML 25G X 5/8 3 ML MISC Use as instructed with B12 injection.   vitamin E  400 UNIT capsule Take 400 Units by mouth in the morning.   [DISCONTINUED] isosorbide  mononitrate (IMDUR ) 30 MG 24 hr tablet TAKE ONE-HALF TABLET BY MOUTH  DAILY   [DISCONTINUED] losartan  (COZAAR ) 25 MG tablet TAKE ONE-HALF TABLET BY MOUTH  DAILY   atenolol  (TENORMIN ) 50 MG tablet Take 1 tablet (50 mg total) by mouth 2 (two) times daily.   isosorbide  mononitrate (IMDUR ) 30 MG 24 hr tablet Take 1 tablet (30 mg total) by mouth daily.   rosuvastatin  (CRESTOR ) 20 MG tablet Take 1 tablet (20 mg total) by mouth daily.   [DISCONTINUED] atenolol  (TENORMIN ) 50 MG tablet TAKE 1 TABLET BY MOUTH TWICE  DAILY   [DISCONTINUED] rosuvastatin  (CRESTOR ) 20 MG tablet TAKE 1 TABLET BY MOUTH DAILY   [DISCONTINUED] TRYPTYR 0.003 % SOLN Apply 1 drop to eye  2 (two) times daily. (Patient not taking: Reported on 12/26/2023)   [EXPIRED] cyanocobalamin  (VITAMIN B12) injection 1,000 mcg    No facility-administered encounter medications on file as of 12/26/2023.     Lab Results  Component Value Date   WBC 5.8 12/26/2023   HGB 12.9 (L) 12/26/2023   HCT 36.3 (L) 12/26/2023   PLT 179.0 12/26/2023   GLUCOSE 156 (H) 12/26/2023   CHOL 98 12/26/2023   TRIG 190.0 (H) 12/26/2023   HDL 39.60 12/26/2023   LDLCALC 20 12/26/2023   ALT 12 12/26/2023   AST 15 12/26/2023   NA 143 12/26/2023   K 3.5 12/26/2023   CL 107 12/26/2023   CREATININE 0.90 12/26/2023   BUN 9 12/26/2023   CO2 27 12/26/2023   TSH 1.21 12/03/2022   PSA 3.36 09/12/2023   INR 1.1  06/07/2016   HGBA1C 6.3 12/26/2023   MICROALBUR 11.3 (H) 09/12/2023    PERIPHERAL VASCULAR CATHETERIZATION Result Date: 12/04/2023 See surgical note for result.      Assessment & Plan:  Health care maintenance Assessment & Plan: Physical today 12/26/23. Colonoscopy - 07/2022 - Sigmoid Colon Polyp - TUBULAR ADENOMA (1). - HYPERPLASTIC POLYP (1). - NEGATIVE FOR HIGH-GRADE DYSPLASIA AND MALIGNANCY.  PSA 08/2023 - 3.36.   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  Off metformin  - had itching. Stopped.   Avoid GLP-1 agonist given GI issues and swallowing problems.  Had started him on jardiance .  Had issues with urinary urgency and frequency.  Off jardiance .  Only on glipizide  XL q day.  Discussed low carb diet and exercise.   Discussed the need to check sugars - to help with control and treatment. Follow sugars.  Follow met b and a1c. Previously referred to endocrinology for evaluation and treatment.  Discussed CGM.  Has declined.   Orders: -     Rosuvastatin  Calcium ; Take 1 tablet (20 mg total) by mouth daily.  Dispense: 90  tablet; Refill: 1 -     Basic metabolic panel with GFR -     Hemoglobin A1c  Hyperlipidemia, unspecified hyperlipidemia type Assessment & Plan: Continue crestor .  Low cholesterol diet and exercise.  Follow lipid panel and liver function tests.  No change in medication today.  Lab Results  Component Value Date   CHOL 98 12/26/2023   HDL 39.60 12/26/2023   LDLCALC 20 12/26/2023   TRIG 190.0 (H) 12/26/2023   CHOLHDL 2 12/26/2023     Orders: -     Rosuvastatin  Calcium ; Take 1 tablet (20 mg total) by mouth daily.  Dispense: 90 tablet; Refill: 1 -     Hepatic function panel -     Lipid panel  Thrombocytopenia Assessment & Plan: Follow cbc.   Orders: -     CBC with Differential/Platelet  Anemia, unspecified type Assessment & Plan: Has been worked up by hematology. Being followed by oncology. Follow cbc and iron  studies.   Orders: -     CBC with Differential/Platelet -     Vitamin B12 -     IBC + Ferritin  Low vitamin B12 level -     Cyanocobalamin  -     Syringe 2-3 ML; USE AS DIRECTED  Dispense: 200 each; Refill: 5 -     Needle (Disp); USE AS DIRECTED TO GIVE B12 ONCE A WEEK FOR 3 WEEKS AND THEN MONTHLY  Dispense: 100 each; Refill: 5  Stricture and stenosis of esophagus Assessment & Plan: S/p antireflux surgery. Swallowing well. Follow.    Psoriatic arthritis (HCC) Assessment & Plan: Followed by rheumatology.  Consentyx/MTX/gabapentin .  Overall stable. Had f/u with rheumatology 09/02/23 - f/u psoriatic arthritis. Continue methotrexate . Reapplying for cosentyx  - pt asistance 2026.    Other iron  deficiency anemia Assessment & Plan: Has been worked up by hematology. Continue to follow cbc.    Primary hypertension Assessment & Plan: Blood pressure remaining elevated. Medications were adjusted during his recent hospitalization. He has increased his atenolol  back up to bid. Also now taking one whole tablet imdur  and losartan . Increase losartan  to 50mg  q day. Follow  pressures. Follow metabolic panel    History of colonic polyps Assessment & Plan: Colonoscopy - 07/2022 - Stomach, biopsy, cbx  Sigmoid Colon Polyp, x2, cold snare - TUBULAR ADENOMA (1). - HYPERPLASTIC POLYP (1). - NEGATIVE FOR HIGH-GRADE DYSPLASIA AND MALIGNANCY.   Gastroesophageal reflux disease, unspecified whether esophagitis present Assessment & Plan: Swallowing better s/p procedure as outlined. Continue dexilant.    Dysphagia, unspecified type Assessment & Plan: Swallowing better s/p procedure. Follow    Coronary artery disease involving native coronary artery of native heart with angina pectoris Assessment & Plan: 09/14/2018 cardiac cath showed patent distal RCA stent with no significant restenosis. Stable moderate disease in the mid LAD and proximal right coronary artery.  OM1 now completely occluded.  It was 99% stenosed before but overall a small branch.  Per note, the only difference noted from most recent angiography was progression of distal RCA disease just proximal to the stent and a tortuous segment causing about 60% stenosis.  It was also noted that he had normal LV systolic function with mildly elevated left ventricular end-diastolic pressure.  On imdur . Continue statin and risk factor modfication.  Denies chest pain now. Follow.    Chronic systolic heart failure (HCC) Assessment & Plan: No evidence of volume overload on exam.  Continue imdur , beta blocker and losartan .  Adjust losartan  dose as outlined. Follow.    Bilateral carotid artery disease, unspecified type Assessment & Plan: S/p carotid stent placement 11/2023. Doing well. Continue risk factor mdofication.    B12 deficiency Assessment & Plan: B12 injection given today. Recommend 1000mcg q week for three more weeks and then q month.    Other orders -     Atenolol ; Take 1 tablet (50 mg total) by mouth 2 (two) times daily.  Dispense: 180 tablet; Refill: 1 -     Losartan  Potassium; Take 1 tablet (50 mg  total) by mouth daily.  Dispense: 90 tablet; Refill: 1 -     Isosorbide  Mononitrate ER; Take 1 tablet (30 mg total) by mouth daily.  Dispense: 90 tablet; Refill: 0     Allena Hamilton, MD

## 2023-12-27 ENCOUNTER — Encounter: Payer: Self-pay | Admitting: Internal Medicine

## 2023-12-27 ENCOUNTER — Ambulatory Visit: Payer: Self-pay | Admitting: Internal Medicine

## 2023-12-27 DIAGNOSIS — E785 Hyperlipidemia, unspecified: Secondary | ICD-10-CM

## 2023-12-27 DIAGNOSIS — R7989 Other specified abnormal findings of blood chemistry: Secondary | ICD-10-CM

## 2023-12-27 DIAGNOSIS — D649 Anemia, unspecified: Secondary | ICD-10-CM

## 2023-12-27 DIAGNOSIS — E538 Deficiency of other specified B group vitamins: Secondary | ICD-10-CM

## 2023-12-27 DIAGNOSIS — E0822 Diabetes mellitus due to underlying condition with diabetic chronic kidney disease: Secondary | ICD-10-CM

## 2023-12-27 NOTE — Assessment & Plan Note (Signed)
 Swallowing better s/p procedure as outlined. Continue dexilant.

## 2023-12-27 NOTE — Assessment & Plan Note (Signed)
 Follow cbc.

## 2023-12-27 NOTE — Assessment & Plan Note (Signed)
 S/p carotid stent placement 11/2023. Doing well. Continue risk factor mdofication.

## 2023-12-27 NOTE — Assessment & Plan Note (Signed)
 09/14/2018 cardiac cath showed patent distal RCA stent with no significant restenosis. Stable moderate disease in the mid LAD and proximal right coronary artery.  OM1 now completely occluded.  It was 99% stenosed before but overall a small branch.  Per note, the only difference noted from most recent angiography was progression of distal RCA disease just proximal to the stent and a tortuous segment causing about 60% stenosis.  It was also noted that he had normal LV systolic function with mildly elevated left ventricular end-diastolic pressure.  On imdur . Continue statin and risk factor modfication.  Denies chest pain now. Follow.

## 2023-12-27 NOTE — Assessment & Plan Note (Signed)
 No evidence of volume overload on exam.  Continue imdur , beta blocker and losartan .  Adjust losartan  dose as outlined. Follow.

## 2023-12-27 NOTE — Assessment & Plan Note (Signed)
 Colonoscopy - 07/2022 - Stomach, biopsy, cbx  Sigmoid Colon Polyp, x2, cold snare - TUBULAR ADENOMA (1). - HYPERPLASTIC POLYP (1). - NEGATIVE FOR HIGH-GRADE DYSPLASIA AND MALIGNANCY.

## 2023-12-27 NOTE — Assessment & Plan Note (Signed)
 Blood pressure remaining elevated. Medications were adjusted during his recent hospitalization. He has increased his atenolol  back up to bid. Also now taking one whole tablet imdur  and losartan . Increase losartan  to 50mg  q day. Follow pressures. Follow metabolic panel

## 2023-12-27 NOTE — Assessment & Plan Note (Signed)
 Continue crestor .  Low cholesterol diet and exercise.  Follow lipid panel and liver function tests.  No change in medication today.  Lab Results  Component Value Date   CHOL 98 12/26/2023   HDL 39.60 12/26/2023   LDLCALC 20 12/26/2023   TRIG 190.0 (H) 12/26/2023   CHOLHDL 2 12/26/2023

## 2023-12-27 NOTE — Assessment & Plan Note (Signed)
 Has been worked up by hematology. Continue to follow cbc.

## 2023-12-27 NOTE — Assessment & Plan Note (Signed)
 Followed by rheumatology.  Consentyx/MTX/gabapentin .  Overall stable. Had f/u with rheumatology 09/02/23 - f/u psoriatic arthritis. Continue methotrexate . Reapplying for cosentyx  - pt asistance 2026.

## 2023-12-27 NOTE — Assessment & Plan Note (Signed)
 Swallowing better s/p procedure. Follow

## 2023-12-27 NOTE — Assessment & Plan Note (Signed)
 Physical today 12/26/23. Colonoscopy - 07/2022 - Sigmoid Colon Polyp - TUBULAR ADENOMA (1). - HYPERPLASTIC POLYP (1). - NEGATIVE FOR HIGH-GRADE DYSPLASIA AND MALIGNANCY.  PSA 08/2023 - 3.36.

## 2023-12-27 NOTE — Assessment & Plan Note (Signed)
 S/p antireflux surgery. Swallowing well. Follow.

## 2023-12-27 NOTE — Assessment & Plan Note (Signed)
 Low carb diet and exercise.  Off metformin  - had itching. Stopped.   Avoid GLP-1 agonist given GI issues and swallowing problems.  Had started him on jardiance .  Had issues with urinary urgency and frequency.  Off jardiance .  Only on glipizide  XL q day.  Discussed low carb diet and exercise.   Discussed the need to check sugars - to help with control and treatment. Follow sugars.  Follow met b and a1c. Previously referred to endocrinology for evaluation and treatment.  Discussed CGM.  Has declined.

## 2023-12-27 NOTE — Assessment & Plan Note (Signed)
 B12 injection given today. Recommend 1000mcg q week for three more weeks and then q month.

## 2023-12-27 NOTE — Assessment & Plan Note (Signed)
 Has been worked up by hematology. Being followed by oncology. Follow cbc and iron  studies.

## 2023-12-29 MED ORDER — ATENOLOL 50 MG PO TABS: 50.0000 mg | ORAL_TABLET | Freq: Two times a day (BID) | ORAL | 1 refills | Status: DC

## 2023-12-29 MED ORDER — NEEDLE (DISP) 25G X 1" MISC: 5 refills | Status: AC

## 2023-12-29 MED ORDER — ROSUVASTATIN CALCIUM 20 MG PO TABS: 20.0000 mg | ORAL_TABLET | Freq: Every day | ORAL | 1 refills | Status: DC

## 2023-12-29 MED ORDER — CYANOCOBALAMIN 1000 MCG/ML IJ SOLN: INTRAMUSCULAR | 1 refills | Status: DC

## 2023-12-29 MED ORDER — ISOSORBIDE MONONITRATE ER 30 MG PO TB24: 30.0000 mg | ORAL_TABLET | Freq: Every day | ORAL | 0 refills | Status: DC

## 2023-12-29 MED ORDER — LOSARTAN POTASSIUM 50 MG PO TABS: 50.0000 mg | ORAL_TABLET | Freq: Every day | ORAL | 1 refills | Status: DC

## 2024-01-02 ENCOUNTER — Other Ambulatory Visit: Payer: Self-pay

## 2024-01-12 ENCOUNTER — Encounter: Payer: Self-pay | Admitting: Internal Medicine

## 2024-01-12 DIAGNOSIS — R7989 Other specified abnormal findings of blood chemistry: Secondary | ICD-10-CM

## 2024-01-12 DIAGNOSIS — I10 Essential (primary) hypertension: Secondary | ICD-10-CM

## 2024-01-12 MED ORDER — SYRINGE 2-3 ML 3 ML MISC: 5 refills | Status: AC

## 2024-01-12 NOTE — Telephone Encounter (Signed)
 Spoke to pt. Notified pt new prescription was sent. Also spoke with pharmacy for verification

## 2024-01-13 NOTE — Telephone Encounter (Signed)
 Copied from CRM [id number]. Topic: Clinical - Prescription Issue >> Jan 13, 2024  5:03 PM Nessti S wrote: Reason for CRM: pt calling to speak with cma daija because he spoke with [facility] pharm and they did not have Syringe, Disposable, (2-3CC SYRINGE) 3 ML MISC refill. He stated he did not need 200 syringes and the amount is incorrect.

## 2024-01-14 MED ORDER — "SYRINGE/NEEDLE (DISP) 25G X 5/8"" 3 ML MISC": 1 refills | Status: AC

## 2024-01-14 NOTE — Telephone Encounter (Signed)
See BP readings:

## 2024-01-14 NOTE — Addendum Note (Signed)
 Addended by: Lorieann Argueta on: 01/14/2024 07:36 AM   Modules accepted: Orders

## 2024-01-14 NOTE — Telephone Encounter (Signed)
Order placed for referral to hematology.  

## 2024-01-16 MED ORDER — LOSARTAN POTASSIUM 100 MG PO TABS: 100.0000 mg | ORAL_TABLET | Freq: Every day | ORAL | 1 refills | Status: DC

## 2024-01-16 NOTE — Telephone Encounter (Signed)
 Please call him and notify him that I reviewed his blood pressure readings. Blood pressures are elevated above goal. Please confirm he is taking losartan  50mg  q day. If so, have him increase losartan  to 100mg  q day. Will need new prescription sent in and correct medication list. Have hi to continue to check his pressures and make sure has a f/u appt scheduled with me - to follow up on blood pressure.

## 2024-01-16 NOTE — Addendum Note (Signed)
 Addended by: Rey Dansby on: 01/16/2024 09:16 AM   Modules accepted: Orders

## 2024-01-16 NOTE — Telephone Encounter (Signed)
 Pt stated he is taking 50mg  of losartan  daily. Pt in agreement with increasing to 100mg  and continued to check bp readings. New prescription sent in to pharmacy

## 2024-01-26 ENCOUNTER — Other Ambulatory Visit: Payer: Self-pay | Admitting: Internal Medicine

## 2024-02-02 ENCOUNTER — Other Ambulatory Visit: Payer: Self-pay | Admitting: Internal Medicine

## 2024-02-07 ENCOUNTER — Other Ambulatory Visit: Payer: Self-pay | Admitting: Internal Medicine

## 2024-02-09 ENCOUNTER — Other Ambulatory Visit: Payer: Self-pay | Admitting: Internal Medicine

## 2024-02-12 ENCOUNTER — Other Ambulatory Visit: Payer: Self-pay | Admitting: Internal Medicine

## 2024-02-13 ENCOUNTER — Encounter: Payer: Self-pay | Admitting: Internal Medicine

## 2024-02-13 ENCOUNTER — Other Ambulatory Visit: Payer: Self-pay | Admitting: Internal Medicine

## 2024-02-13 ENCOUNTER — Ambulatory Visit: Admitting: Internal Medicine

## 2024-02-13 VITALS — BP 146/80 | HR 61 | Temp 98.2°F | Ht 68.5 in | Wt 174.4 lb

## 2024-02-13 DIAGNOSIS — I779 Disorder of arteries and arterioles, unspecified: Secondary | ICD-10-CM

## 2024-02-13 DIAGNOSIS — E538 Deficiency of other specified B group vitamins: Secondary | ICD-10-CM

## 2024-02-13 DIAGNOSIS — K222 Esophageal obstruction: Secondary | ICD-10-CM

## 2024-02-13 DIAGNOSIS — D508 Other iron deficiency anemias: Secondary | ICD-10-CM

## 2024-02-13 DIAGNOSIS — N183 Chronic kidney disease, stage 3 unspecified: Secondary | ICD-10-CM

## 2024-02-13 DIAGNOSIS — L4050 Arthropathic psoriasis, unspecified: Secondary | ICD-10-CM

## 2024-02-13 DIAGNOSIS — E1159 Type 2 diabetes mellitus with other circulatory complications: Secondary | ICD-10-CM

## 2024-02-13 DIAGNOSIS — I5022 Chronic systolic (congestive) heart failure: Secondary | ICD-10-CM

## 2024-02-13 DIAGNOSIS — D696 Thrombocytopenia, unspecified: Secondary | ICD-10-CM

## 2024-02-13 DIAGNOSIS — I25119 Atherosclerotic heart disease of native coronary artery with unspecified angina pectoris: Secondary | ICD-10-CM

## 2024-02-13 DIAGNOSIS — E785 Hyperlipidemia, unspecified: Secondary | ICD-10-CM

## 2024-02-13 DIAGNOSIS — Z860100 Personal history of colon polyps, unspecified: Secondary | ICD-10-CM

## 2024-02-13 DIAGNOSIS — I10 Essential (primary) hypertension: Secondary | ICD-10-CM

## 2024-02-13 LAB — BASIC METABOLIC PANEL WITH GFR
BUN: 10 mg/dL (ref 6–23)
CO2: 28 meq/L (ref 19–32)
Calcium: 9.3 mg/dL (ref 8.4–10.5)
Chloride: 104 meq/L (ref 96–112)
Creatinine, Ser: 0.75 mg/dL (ref 0.40–1.50)
GFR: 88.36 mL/min
Glucose, Bld: 190 mg/dL — ABNORMAL HIGH (ref 70–99)
Potassium: 3.9 meq/L (ref 3.5–5.1)
Sodium: 140 meq/L (ref 135–145)

## 2024-02-13 MED ORDER — ROSUVASTATIN CALCIUM 20 MG PO TABS: 20.0000 mg | ORAL_TABLET | Freq: Every day | ORAL | 3 refills | Status: AC

## 2024-02-13 MED ORDER — ATENOLOL 50 MG PO TABS: 50.0000 mg | ORAL_TABLET | Freq: Two times a day (BID) | ORAL | 3 refills | Status: AC

## 2024-02-13 MED ORDER — CYANOCOBALAMIN 1000 MCG/ML IJ SOLN: INTRAMUSCULAR | 1 refills | Status: AC

## 2024-02-13 MED ORDER — ISOSORBIDE MONONITRATE ER 30 MG PO TB24: 30.0000 mg | ORAL_TABLET | Freq: Every day | ORAL | 3 refills | Status: AC

## 2024-02-13 MED ORDER — GLIPIZIDE ER 5 MG PO TB24: 5.0000 mg | ORAL_TABLET | Freq: Two times a day (BID) | ORAL | 1 refills | Status: AC

## 2024-02-13 MED ORDER — VALSARTAN 160 MG PO TABS: 160.0000 mg | ORAL_TABLET | Freq: Every day | ORAL | 1 refills | Status: DC

## 2024-02-13 MED ORDER — AMLODIPINE BESYLATE 2.5 MG PO TABS: 2.5000 mg | ORAL_TABLET | Freq: Every day | ORAL | 1 refills | Status: DC

## 2024-02-13 NOTE — Telephone Encounter (Signed)
 Rx sent in for 90 day supply of amlodipine  and diovan .

## 2024-02-13 NOTE — Patient Instructions (Signed)
 Stop losartan   Start diovan  160mg  - take one per day  Start amlodipine  2.5mg  - take one per day.

## 2024-02-14 ENCOUNTER — Ambulatory Visit: Payer: Self-pay | Admitting: Internal Medicine

## 2024-02-16 ENCOUNTER — Encounter: Payer: Self-pay | Admitting: Internal Medicine

## 2024-02-16 NOTE — Assessment & Plan Note (Signed)
Has been worked up by hematology.  Follow cbc.  

## 2024-02-16 NOTE — Assessment & Plan Note (Signed)
 S/p carotid stent placement 11/2023. Doing well. Continue risk factor mdofication.

## 2024-02-16 NOTE — Assessment & Plan Note (Signed)
 09/14/2018 cardiac cath showed patent distal RCA stent with no significant restenosis. Stable moderate disease in the mid LAD and proximal right coronary artery.  OM1 now completely occluded.  It was 99% stenosed before but overall a small branch.  Per note, the only difference noted from most recent angiography was progression of distal RCA disease just proximal to the stent and a tortuous segment causing about 60% stenosis.  It was also noted that he had normal LV systolic function with mildly elevated left ventricular end-diastolic pressure.  On imdur . Continue statin and risk factor modfication.  Denies chest pain now. Follow.

## 2024-02-16 NOTE — Assessment & Plan Note (Signed)
 S/p antireflux surgery. Swallowing well. Follow.

## 2024-02-16 NOTE — Assessment & Plan Note (Signed)
 Last platelet count - one month ago - wnl.

## 2024-02-16 NOTE — Assessment & Plan Note (Signed)
 Continue crestor .  Low cholesterol diet and exercise.  Follow lipid panel.  Lab Results  Component Value Date   CHOL 98 12/26/2023   HDL 39.60 12/26/2023   LDLCALC 20 12/26/2023   TRIG 190.0 (H) 12/26/2023   CHOLHDL 2 12/26/2023

## 2024-02-16 NOTE — Assessment & Plan Note (Signed)
 No evidence of volume overload on exam.  Continue imdur , beta blocker and ARB. Will change losartan  to diovan  as outlined. Follow metabolic panel. Follow weight.

## 2024-02-16 NOTE — Assessment & Plan Note (Signed)
 Colonoscopy - 07/2022 - Stomach, biopsy, cbx  Sigmoid Colon Polyp, x2, cold snare - TUBULAR ADENOMA (1). - HYPERPLASTIC POLYP (1). - NEGATIVE FOR HIGH-GRADE DYSPLASIA AND MALIGNANCY.

## 2024-02-16 NOTE — Assessment & Plan Note (Signed)
 Continue B12 injections.

## 2024-02-18 NOTE — Telephone Encounter (Signed)
 Blood pressures are elevated, but it appears he has just started on his medication. Since we just added amlodipine  and made the medication change, recommend to continue current medication regimen and monitor blood pressure.

## 2024-03-05 ENCOUNTER — Inpatient Hospital Stay: Admitting: Oncology

## 2024-03-12 ENCOUNTER — Ambulatory Visit: Admitting: Internal Medicine

## 2024-11-15 ENCOUNTER — Ambulatory Visit
